# Patient Record
Sex: Male | Born: 1965
Health system: Southern US, Community
[De-identification: ages and names within clinical notes are randomized; demographics above are authoritative.]

## PROBLEM LIST (undated history)

## (undated) DIAGNOSIS — I10 Essential (primary) hypertension: Secondary | ICD-10-CM

## (undated) DIAGNOSIS — Z951 Presence of aortocoronary bypass graft: Secondary | ICD-10-CM

## (undated) DIAGNOSIS — D649 Anemia, unspecified: Secondary | ICD-10-CM

## (undated) DIAGNOSIS — G4730 Sleep apnea, unspecified: Secondary | ICD-10-CM

## (undated) DIAGNOSIS — K859 Acute pancreatitis without necrosis or infection, unspecified: Secondary | ICD-10-CM

## (undated) DIAGNOSIS — I2510 Atherosclerotic heart disease of native coronary artery without angina pectoris: Secondary | ICD-10-CM

## (undated) DIAGNOSIS — I509 Heart failure, unspecified: Secondary | ICD-10-CM

## (undated) DIAGNOSIS — J3489 Other specified disorders of nose and nasal sinuses: Secondary | ICD-10-CM

## (undated) DIAGNOSIS — N281 Cyst of kidney, acquired: Secondary | ICD-10-CM

## (undated) DIAGNOSIS — R079 Chest pain, unspecified: Secondary | ICD-10-CM

## (undated) DIAGNOSIS — K760 Fatty (change of) liver, not elsewhere classified: Secondary | ICD-10-CM

## (undated) DIAGNOSIS — C8590 Non-Hodgkin lymphoma, unspecified, unspecified site: Secondary | ICD-10-CM

## (undated) DIAGNOSIS — M2340 Loose body in knee, unspecified knee: Secondary | ICD-10-CM

## (undated) DIAGNOSIS — J189 Pneumonia, unspecified organism: Secondary | ICD-10-CM

## (undated) DIAGNOSIS — Z5189 Encounter for other specified aftercare: Secondary | ICD-10-CM

## (undated) DIAGNOSIS — S83289A Other tear of lateral meniscus, current injury, unspecified knee, initial encounter: Secondary | ICD-10-CM

## (undated) DIAGNOSIS — E785 Hyperlipidemia, unspecified: Secondary | ICD-10-CM

## (undated) DIAGNOSIS — R9439 Abnormal result of other cardiovascular function study: Secondary | ICD-10-CM

## (undated) DIAGNOSIS — D732 Chronic congestive splenomegaly: Secondary | ICD-10-CM

## (undated) HISTORY — PX: NASAL SEPTUM SURGERY: SHX37

## (undated) HISTORY — PX: OTHER SURGICAL HISTORY: SHX169

## (undated) HISTORY — PX: HERNIA REPAIR: SHX51

## (undated) HISTORY — DX: Atherosclerotic heart disease of native coronary artery without angina pectoris: I25.10

## (undated) HISTORY — PX: TONSILLECTOMY: SUR1361

## (undated) HISTORY — PX: ELBOW SURGERY: SHX618

## (undated) HISTORY — PX: CARDIAC CATHETERIZATION: SHX172

## (undated) HISTORY — PX: ABDOMINAL AORTIC ANEURYSM REPAIR: SUR1152

## (undated) HISTORY — DX: Encounter for other specified aftercare: Z51.89

## (undated) HISTORY — DX: Chest pain, unspecified: R07.9

---

## 1989-03-19 DIAGNOSIS — C8590 Non-Hodgkin lymphoma, unspecified, unspecified site: Secondary | ICD-10-CM

## 1989-03-19 HISTORY — DX: Non-Hodgkin lymphoma, unspecified, unspecified site: C85.90

## 2007-03-02 ENCOUNTER — Inpatient Hospital Stay (HOSPITAL_COMMUNITY): Admission: EM | Admit: 2007-03-02 | Discharge: 2007-03-04 | Payer: Self-pay | Admitting: Emergency Medicine

## 2007-09-07 ENCOUNTER — Ambulatory Visit: Payer: Self-pay | Admitting: Vascular Surgery

## 2007-09-07 ENCOUNTER — Ambulatory Visit: Payer: Self-pay | Admitting: Oncology

## 2007-09-07 ENCOUNTER — Inpatient Hospital Stay (HOSPITAL_COMMUNITY): Admission: EM | Admit: 2007-09-07 | Discharge: 2007-09-15 | Payer: Self-pay | Admitting: Emergency Medicine

## 2007-09-07 ENCOUNTER — Encounter (INDEPENDENT_AMBULATORY_CARE_PROVIDER_SITE_OTHER): Payer: Self-pay | Admitting: Emergency Medicine

## 2007-09-07 ENCOUNTER — Ambulatory Visit: Payer: Self-pay | Admitting: Pulmonary Disease

## 2007-09-08 ENCOUNTER — Encounter (INDEPENDENT_AMBULATORY_CARE_PROVIDER_SITE_OTHER): Payer: Self-pay | Admitting: Pulmonary Disease

## 2007-09-24 ENCOUNTER — Encounter: Admission: RE | Admit: 2007-09-24 | Discharge: 2007-09-24 | Payer: Self-pay | Admitting: Family Medicine

## 2009-08-02 IMAGING — CR DG CHEST 1V PORT
1 series · 1 of 1 positions shown · non-contrast
Comparison: None

CLINICAL DATA: Fever.  Congestive heart failure.

PORTABLE CHEST - 1 VIEW

[view not recorded]
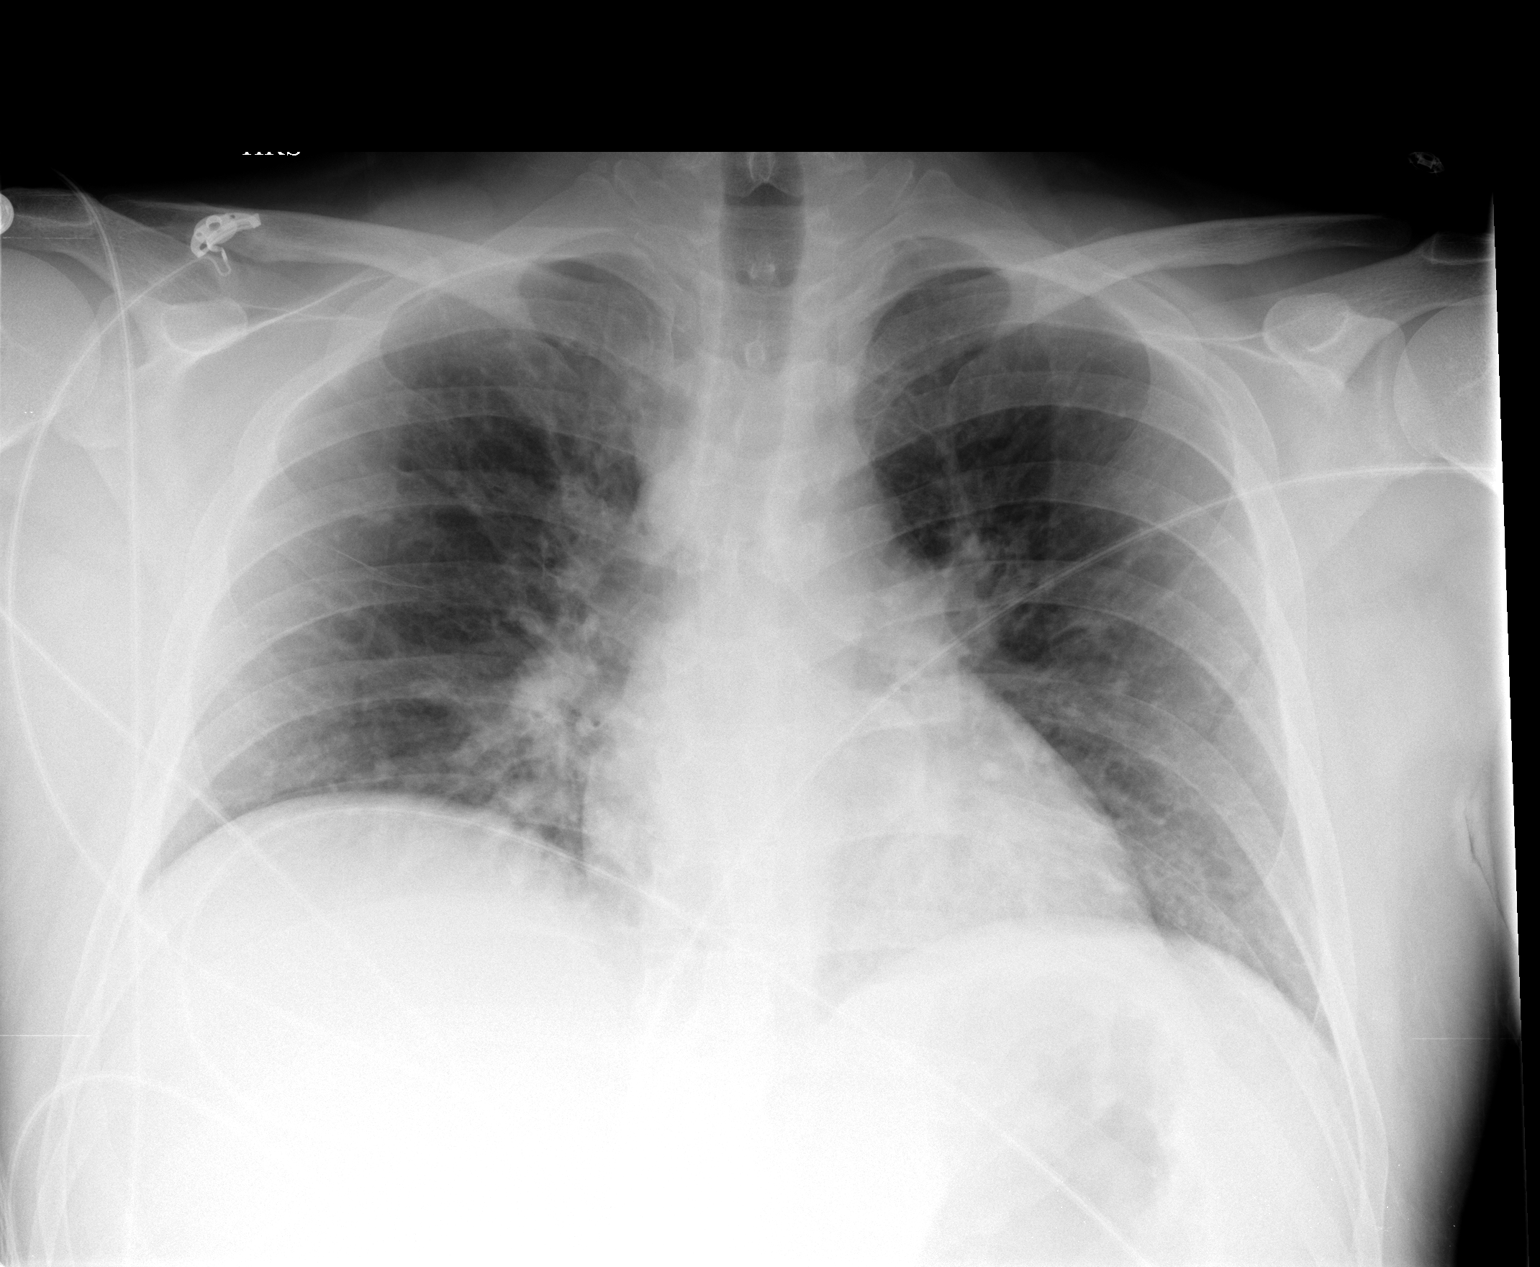

[1 of 1 positions shown; findings below may reference images not displayed]

FINDINGS: The heart size is normal.  The azygos vein is slightly
distended but the pulmonary vascularity is normal. There is some
peribronchial thickening at the right lung base but there are no
discrete infiltrates or effusions.  No significant bony
abnormality.

The patient has taken a shallow inspiration which could accentuate
the azygos vein and markings.
IMPRESSION: Probable bronchitic changes at the right base.

## 2009-09-17 ENCOUNTER — Inpatient Hospital Stay (HOSPITAL_COMMUNITY): Admission: EM | Admit: 2009-09-17 | Discharge: 2009-09-22 | Payer: Self-pay | Admitting: Emergency Medicine

## 2009-09-20 ENCOUNTER — Ambulatory Visit: Payer: Self-pay | Admitting: Oncology

## 2009-09-27 ENCOUNTER — Ambulatory Visit: Payer: Self-pay | Admitting: Oncology

## 2009-09-27 ENCOUNTER — Observation Stay (HOSPITAL_COMMUNITY): Admission: EM | Admit: 2009-09-27 | Discharge: 2009-09-29 | Payer: Self-pay | Admitting: Emergency Medicine

## 2009-10-17 LAB — CBC & DIFF AND RETIC
HGB: 11 g/dL — ABNORMAL LOW (ref 13.0–17.1)
MCH: 24.1 pg — ABNORMAL LOW (ref 27.2–33.4)
MCHC: 32.1 g/dL (ref 32.0–36.0)
MCV: 75.2 fL — ABNORMAL LOW (ref 79.3–98.0)
NEUT#: 4.8 10*3/uL (ref 1.5–6.5)
Platelets: 110 10*3/uL — ABNORMAL LOW (ref 140–400)
Retic %: 2.15 % — ABNORMAL HIGH (ref 0.50–1.60)
WBC: 7.8 10*3/uL (ref 4.0–10.3)
lymph#: 2.1 10*3/uL (ref 0.9–3.3)

## 2009-10-17 LAB — COMPREHENSIVE METABOLIC PANEL
ALT: 35 U/L (ref 0–53)
AST: 24 U/L (ref 0–37)
Albumin: 4.2 g/dL (ref 3.5–5.2)
BUN: 14 mg/dL (ref 6–23)
Glucose, Bld: 104 mg/dL — ABNORMAL HIGH (ref 70–99)
Total Bilirubin: 0.4 mg/dL (ref 0.3–1.2)

## 2009-10-17 LAB — MORPHOLOGY: PLT EST: DECREASED

## 2009-10-17 LAB — AMYLASE: Amylase: 46 U/L (ref 0–105)

## 2009-10-17 LAB — LIPASE: Lipase: 52 U/L (ref 0–75)

## 2010-02-14 ENCOUNTER — Ambulatory Visit: Payer: Self-pay | Admitting: Oncology

## 2010-02-24 LAB — CBC & DIFF AND RETIC
BASO%: 1.6 % (ref 0.0–2.0)
HGB: 12.5 g/dL — ABNORMAL LOW (ref 13.0–17.1)
MCV: 74.2 fL — ABNORMAL LOW (ref 79.3–98.0)
MONO#: 0.5 10*3/uL (ref 0.1–0.9)
NEUT#: 2.9 10*3/uL (ref 1.5–6.5)
Retic Ct Abs: 86.38 10*3/uL — ABNORMAL HIGH (ref 24.10–77.50)

## 2010-02-24 LAB — COMPREHENSIVE METABOLIC PANEL
ALT: 36 U/L (ref 0–53)
CO2: 21 mEq/L (ref 19–32)
Chloride: 96 mEq/L (ref 96–112)
Creatinine, Ser: 1 mg/dL (ref 0.40–1.50)
Glucose, Bld: 359 mg/dL — ABNORMAL HIGH (ref 70–99)
Potassium: 4.2 mEq/L (ref 3.5–5.3)
Total Bilirubin: 0.4 mg/dL (ref 0.3–1.2)
Total Protein: 7.4 g/dL (ref 6.0–8.3)

## 2010-02-24 LAB — LIPASE: Lipase: 67 U/L (ref 0–75)

## 2010-02-24 LAB — MORPHOLOGY: PLT EST: DECREASED

## 2010-02-24 LAB — CHCC SMEAR

## 2010-04-10 ENCOUNTER — Encounter: Payer: Self-pay | Admitting: Family Medicine

## 2010-06-04 LAB — CROSSMATCH: ABO/RH(D): AB NEG

## 2010-06-04 LAB — BASIC METABOLIC PANEL
BUN: 10 mg/dL (ref 6–23)
CO2: 21 mEq/L (ref 19–32)
Chloride: 107 mEq/L (ref 96–112)
Chloride: 110 mEq/L (ref 96–112)
Creatinine, Ser: 0.77 mg/dL (ref 0.4–1.5)
GFR calc Af Amer: >60 mL/min (ref >60)
GFR calc non Af Amer: >60 mL/min (ref >60)
Potassium: 5 mEq/L (ref 3.5–5.1)

## 2010-06-04 LAB — CBC
HCT: 26.3 % — ABNORMAL LOW (ref 39.0–52.0)
HCT: 27.7 % — ABNORMAL LOW (ref 39.0–52.0)
HCT: 29.6 % — ABNORMAL LOW (ref 39.0–52.0)
HCT: 38.4 % — ABNORMAL LOW (ref 39.0–52.0)
HCT: 41.9 % (ref 39.0–52.0)
Hemoglobin: 10.1 g/dL — ABNORMAL LOW (ref 13.0–17.0)
Hemoglobin: 12.5 g/dL — ABNORMAL LOW (ref 13.0–17.0)
Hemoglobin: 13.6 g/dL (ref 13.0–17.0)
Hemoglobin: 8.6 g/dL — ABNORMAL LOW (ref 13.0–17.0)
Hemoglobin: 9.3 g/dL — ABNORMAL LOW (ref 13.0–17.0)
Hemoglobin: 9.7 g/dL — ABNORMAL LOW (ref 13.0–17.0)
MCH: 25.3 pg — ABNORMAL LOW (ref 26.0–34.0)
MCH: 25.4 pg — ABNORMAL LOW (ref 26.0–34.0)
MCH: 25.6 pg — ABNORMAL LOW (ref 26.0–34.0)
MCH: 25.6 pg — ABNORMAL LOW (ref 26.0–34.0)
MCHC: 33.7 g/dL (ref 30.0–36.0)
MCHC: 34 g/dL (ref 30.0–36.0)
MCHC: 34.1 g/dL (ref 30.0–36.0)
MCHC: 34.4 g/dL (ref 30.0–36.0)
MCV: 75.3 fL — ABNORMAL LOW (ref 78.0–100.0)
MCV: 75.3 fL — ABNORMAL LOW (ref 78.0–100.0)
MCV: 75.5 fL — ABNORMAL LOW (ref 78.0–100.0)
MCV: 75.6 fL — ABNORMAL LOW (ref 78.0–100.0)
MCV: 76 fL — ABNORMAL LOW (ref 78.0–100.0)
MCV: 76.1 fL — ABNORMAL LOW (ref 78.0–100.0)
Platelets: 135 10*3/uL — ABNORMAL LOW (ref 150–400)
Platelets: 204 10*3/uL (ref 150–400)
Platelets: 217 10*3/uL (ref 150–400)
Platelets: 77 10*3/uL — ABNORMAL LOW (ref 150–400)
Platelets: 81 10*3/uL — ABNORMAL LOW (ref 150–400)
Platelets: 89 10*3/uL — ABNORMAL LOW (ref 150–400)
Platelets: 98 10*3/uL — ABNORMAL LOW (ref 150–400)
RBC: 3.36 MIL/uL — ABNORMAL LOW (ref 4.22–5.81)
RBC: 3.48 MIL/uL — ABNORMAL LOW (ref 4.22–5.81)
RBC: 3.52 MIL/uL — ABNORMAL LOW (ref 4.22–5.81)
RBC: 3.85 MIL/uL — ABNORMAL LOW (ref 4.22–5.81)
RBC: 4.89 MIL/uL (ref 4.22–5.81)
RBC: 5.04 MIL/uL (ref 4.22–5.81)
RBC: 5.47 MIL/uL (ref 4.22–5.81)
RDW: 17.3 % — ABNORMAL HIGH (ref 11.5–15.5)
RDW: 17.3 % — ABNORMAL HIGH (ref 11.5–15.5)
RDW: 17.4 % — ABNORMAL HIGH (ref 11.5–15.5)
RDW: 17.4 % — ABNORMAL HIGH (ref 11.5–15.5)
WBC: 10.6 10*3/uL — ABNORMAL HIGH (ref 4.0–10.5)
WBC: 13.2 10*3/uL — ABNORMAL HIGH (ref 4.0–10.5)
WBC: 3.6 10*3/uL — ABNORMAL LOW (ref 4.0–10.5)
WBC: 4.7 10*3/uL (ref 4.0–10.5)
WBC: 4.8 10*3/uL (ref 4.0–10.5)
WBC: 5.8 10*3/uL (ref 4.0–10.5)
WBC: 7.7 10*3/uL (ref 4.0–10.5)
WBC: 8.4 10*3/uL (ref 4.0–10.5)

## 2010-06-04 LAB — COMPREHENSIVE METABOLIC PANEL
ALT: 17 U/L (ref 0–53)
ALT: 26 U/L (ref 0–53)
ALT: 32 U/L (ref 0–53)
ALT: 49 U/L (ref 0–53)
ALT: 51 U/L (ref 0–53)
AST: 14 U/L (ref 0–37)
AST: 15 U/L (ref 0–37)
AST: 32 U/L (ref 0–37)
AST: 87 U/L — ABNORMAL HIGH (ref 0–37)
Albumin: 2.5 g/dL — ABNORMAL LOW (ref 3.5–5.2)
Albumin: 2.6 g/dL — ABNORMAL LOW (ref 3.5–5.2)
Albumin: 2.8 g/dL — ABNORMAL LOW (ref 3.5–5.2)
Albumin: 2.8 g/dL — ABNORMAL LOW (ref 3.5–5.2)
Albumin: 3.9 g/dL (ref 3.5–5.2)
Alkaline Phosphatase: 40 U/L (ref 39–117)
Alkaline Phosphatase: 65 U/L (ref 39–117)
Alkaline Phosphatase: 78 U/L (ref 39–117)
Alkaline Phosphatase: 84 U/L (ref 39–117)
Alkaline Phosphatase: 87 U/L (ref 39–117)
BUN: 13 mg/dL (ref 6–23)
BUN: 17 mg/dL (ref 6–23)
BUN: 8 mg/dL (ref 6–23)
CO2: 25 mEq/L (ref 19–32)
CO2: 25 mEq/L (ref 19–32)
CO2: 26 mEq/L (ref 19–32)
Calcium: 10.3 mg/dL (ref 8.4–10.5)
Chloride: 105 mEq/L (ref 96–112)
Chloride: 109 mEq/L (ref 96–112)
Chloride: 110 mEq/L (ref 96–112)
Chloride: 111 mEq/L (ref 96–112)
Chloride: 116 mEq/L — ABNORMAL HIGH (ref 96–112)
Creatinine, Ser: 0.71 mg/dL (ref 0.4–1.5)
Creatinine, Ser: 0.85 mg/dL (ref 0.4–1.5)
GFR calc Af Amer: >60 mL/min (ref >60)
GFR calc Af Amer: >60 mL/min (ref >60)
GFR calc Af Amer: >60 mL/min (ref >60)
GFR calc non Af Amer: >60 mL/min (ref >60)
GFR calc non Af Amer: >60 mL/min (ref >60)
GFR calc non Af Amer: >60 mL/min (ref >60)
Glucose, Bld: 126 mg/dL — ABNORMAL HIGH (ref 70–99)
Glucose, Bld: 171 mg/dL — ABNORMAL HIGH (ref 70–99)
Potassium: 3.6 mEq/L (ref 3.5–5.1)
Potassium: 3.8 mEq/L (ref 3.5–5.1)
Potassium: 3.9 mEq/L (ref 3.5–5.1)
Potassium: 3.9 mEq/L (ref 3.5–5.1)
Potassium: 4.5 mEq/L (ref 3.5–5.1)
Sodium: 135 mEq/L (ref 135–145)
Sodium: 137 mEq/L (ref 135–145)
Sodium: 139 mEq/L (ref 135–145)
Sodium: 141 mEq/L (ref 135–145)
Total Bilirubin: 0.4 mg/dL (ref 0.3–1.2)
Total Bilirubin: 0.4 mg/dL (ref 0.3–1.2)
Total Bilirubin: 0.4 mg/dL (ref 0.3–1.2)
Total Bilirubin: 0.5 mg/dL (ref 0.3–1.2)
Total Bilirubin: 0.7 mg/dL (ref 0.3–1.2)
Total Bilirubin: 1 mg/dL (ref 0.3–1.2)
Total Bilirubin: 2.7 mg/dL — ABNORMAL HIGH (ref 0.3–1.2)
Total Protein: 6.3 g/dL (ref 6.0–8.3)
Total Protein: 7.9 g/dL (ref 6.0–8.3)

## 2010-06-04 LAB — URINALYSIS, ROUTINE W REFLEX MICROSCOPIC
Bilirubin Urine: NEGATIVE
Glucose, UA: >1000 mg/dL — AB
Hgb urine dipstick: NEGATIVE
Leukocytes, UA: NEGATIVE
Nitrite: NEGATIVE
Protein, ur: 100 mg/dL — AB
Specific Gravity, Urine: 1.04 — ABNORMAL HIGH (ref 1.005–1.030)
Urobilinogen, UA: 0.2 mg/dL (ref 0.0–1.0)
pH: 5.5 (ref 5.0–8.0)

## 2010-06-04 LAB — GLUCOSE, CAPILLARY
Glucose-Capillary: 117 mg/dL — ABNORMAL HIGH (ref 70–99)
Glucose-Capillary: 121 mg/dL — ABNORMAL HIGH (ref 70–99)
Glucose-Capillary: 126 mg/dL — ABNORMAL HIGH (ref 70–99)
Glucose-Capillary: 164 mg/dL — ABNORMAL HIGH (ref 70–99)
Glucose-Capillary: 182 mg/dL — ABNORMAL HIGH (ref 70–99)
Glucose-Capillary: 183 mg/dL — ABNORMAL HIGH (ref 70–99)
Glucose-Capillary: 184 mg/dL — ABNORMAL HIGH (ref 70–99)
Glucose-Capillary: 184 mg/dL — ABNORMAL HIGH (ref 70–99)
Glucose-Capillary: 188 mg/dL — ABNORMAL HIGH (ref 70–99)
Glucose-Capillary: 191 mg/dL — ABNORMAL HIGH (ref 70–99)
Glucose-Capillary: 191 mg/dL — ABNORMAL HIGH (ref 70–99)
Glucose-Capillary: 224 mg/dL — ABNORMAL HIGH (ref 70–99)
Glucose-Capillary: 361 mg/dL — ABNORMAL HIGH (ref 70–99)

## 2010-06-04 LAB — LIPID PANEL
Cholesterol: 120 mg/dL (ref 0–200)
LDL Cholesterol: 43 mg/dL (ref 0–99)
LDL Cholesterol: UNDETERMINED mg/dL (ref 0–99)
LDL Cholesterol: UNDETERMINED mg/dL (ref 0–99)
Triglycerides: 295 mg/dL — ABNORMAL HIGH (ref <150)
Triglycerides: 3962 mg/dL — ABNORMAL HIGH (ref <150)
VLDL: UNDETERMINED mg/dL (ref 0–40)

## 2010-06-04 LAB — DIFFERENTIAL
Basophils Absolute: 0 10*3/uL (ref 0.0–0.1)
Basophils Absolute: 0.1 10*3/uL (ref 0.0–0.1)
Basophils Absolute: 0.2 10*3/uL — ABNORMAL HIGH (ref 0.0–0.1)
Basophils Relative: 0 % (ref 0–1)
Eosinophils Absolute: 0 10*3/uL (ref 0.0–0.7)
Eosinophils Absolute: 0.1 10*3/uL (ref 0.0–0.7)
Eosinophils Relative: 1 % (ref 0–5)
Eosinophils Relative: 2 % (ref 0–5)
Lymphocytes Relative: 14 % (ref 12–46)
Lymphocytes Relative: 15 % (ref 12–46)
Lymphs Abs: 1.3 10*3/uL (ref 0.7–4.0)
Monocytes Relative: 4 % (ref 3–12)
Neutro Abs: 10 10*3/uL — ABNORMAL HIGH (ref 1.7–7.7)
Neutro Abs: 12 10*3/uL — ABNORMAL HIGH (ref 1.7–7.7)
Neutrophils Relative %: 74 % (ref 43–77)
Neutrophils Relative %: 78 % — ABNORMAL HIGH (ref 43–77)

## 2010-06-04 LAB — POTASSIUM: Potassium: 5.5 mEq/L — ABNORMAL HIGH (ref 3.5–5.1)

## 2010-06-04 LAB — LIPASE, BLOOD
Lipase: 105 U/L — ABNORMAL HIGH (ref 11–59)
Lipase: 167 U/L — ABNORMAL HIGH (ref 11–59)
Lipase: 94 U/L — ABNORMAL HIGH (ref 11–59)

## 2010-06-04 LAB — CULTURE, BLOOD (ROUTINE X 2): Culture: NO GROWTH

## 2010-06-04 LAB — URINE CULTURE: Colony Count: NO GROWTH

## 2010-06-04 LAB — URINE MICROSCOPIC-ADD ON

## 2010-06-04 LAB — HEPATIC FUNCTION PANEL
ALT: 23 U/L (ref 0–53)
AST: 41 U/L — ABNORMAL HIGH (ref 0–37)
Alkaline Phosphatase: 53 U/L (ref 39–117)
Bilirubin, Direct: 0.5 mg/dL — ABNORMAL HIGH (ref 0.0–0.3)
Total Bilirubin: 1.1 mg/dL (ref 0.3–1.2)

## 2010-06-04 LAB — VITAMIN D 1,25 DIHYDROXY
Vitamin D 1, 25 (OH)2 Total: 23 pg/mL (ref 18–72)
Vitamin D3 1, 25 (OH)2: 23 pg/mL

## 2010-06-04 LAB — HEPARIN INDUCED THROMBOCYTOPENIA PNL
Heparin Induced Plt Ab: NEGATIVE
UFH SRA Result: NEGATIVE

## 2010-06-04 LAB — PTH, INTACT AND CALCIUM
Calcium, Total (PTH): 9.5 mg/dL (ref 8.4–10.5)
PTH: 27.2 pg/mL (ref 14.0–72.0)

## 2010-06-04 LAB — PROTIME-INR: INR: 1.2 (ref 0.00–1.49)

## 2010-06-04 LAB — DIC (DISSEMINATED INTRAVASCULAR COAGULATION)PANEL
Fibrinogen: 735 mg/dL — ABNORMAL HIGH (ref 204–475)
Platelets: 80 10*3/uL — ABNORMAL LOW (ref 150–400)
Smear Review: NONE SEEN
aPTT: 32 seconds (ref 24–37)

## 2010-06-04 LAB — CARDIAC PANEL(CRET KIN+CKTOT+MB+TROPI)
CK, MB: 1.5 ng/mL (ref 0.3–4.0)
Total CK: 44 U/L (ref 7–232)

## 2010-06-04 LAB — HEMOGLOBIN A1C: Mean Plasma Glucose: 255 mg/dL — ABNORMAL HIGH (ref <117)

## 2010-06-04 LAB — GASTRIC OCCULT BLOOD (1-CARD TO LAB)
Occult Blood, Gastric: POSITIVE — AB
pH, Gastric: 1

## 2010-06-04 LAB — TECHNOLOGIST SMEAR REVIEW

## 2010-06-04 LAB — IRON AND TIBC
Iron: 42 ug/dL (ref 42–135)
UIBC: 175 ug/dL

## 2010-06-04 LAB — HEMOGLOBIN AND HEMATOCRIT, BLOOD
HCT: 26 % — ABNORMAL LOW (ref 39.0–52.0)
Hemoglobin: 8.8 g/dL — ABNORMAL LOW (ref 13.0–17.0)

## 2010-06-04 LAB — FERRITIN: Ferritin: 246 ng/mL (ref 22–322)

## 2010-08-01 NOTE — H&P (Signed)
Juan [name]                 ACCOUNT NO.:  [id number]   MEDICAL RECORD NO.:  [id number]          PATIENT TYPE:  INP   LOCATION:  5710                         FACILITY:  [facility]   PHYSICIAN:  Juan [name], M.D.DATE OF BIRTH:  01-23-1966   DATE OF ADMISSION:  03/02/2007  DATE OF DISCHARGE:                              HISTORY & PHYSICAL   PRIMARY CARE PHYSICIAN:  Unassigned.   CHIEF COMPLAINT:  Cat bite.   HISTORY OF PRESENTING COMPLAINT:  Juan [name] is a 45 year old Caucasian  male, with history of diabetes mellitus and hyperlipidemia.  He recently  relocated from [facility] to [place] yesterday.  He sustained a cat bite  yesterday night.  He reported that he traveled with a cat from [place]  to [place], and he had to keep him restricted.  Also while unpacking  at home, he kept the cat restricted -- hence the cat was somewhat  aggravated.  The patient states that the cat is up to date with his  immunizations and veterinary check ups.  The cat bite was through his  right hand, over the thenar prominence and posterior aspect of distal  right forearm.  He also had multiple surrounding scratch marks.   This area became red and swollen when he woke up this morning.  He  decided to come to the emergency room.  The patient has received 3 grams  of Unasyn.  He denies fever or chills.  His temperature was noted to be  100.8 during the course in the emergency room.  His white cell count is  normal.  He states that the forearm swelling has diminished appreciably  since he has been in the emergency room, after the IV antibiotics and  narcotic analgesia.   The patient will be admitted for further treatment.   PAST MEDICAL HISTORY:  1. Diabetes mellitus.  Last hemoglobin A1c was above 8.  This was done      about 6 months ago.  2. Hyperlipidemia, with predominate hypertriglyceridemia.  The patient      states that his triglycerides range between 2000 to 10,000.  He      sees an  endocrinologist.  3. History of lymphoma in the 1990's.  Status post chemotherapy and      radiation therapy.   CURRENT MEDICATIONS:  1. Zetia 10 mg daily.  2. Crestor 10 mg daily.  3. Lantus insulin 40 units subcutaneous q.h.s.  4. Metformin 1000 mg b.i.d.  5. Tricor 145 mg p.o. daily.   ALLERGIES:  NO KNOWN DRUG ALLERGIES.   SOCIAL HISTORY:  The patient does not smoke cigarettes nor drink  alcohol.  He does not use illicit drugs.  He works with [employer].  He  relocated from [place] to [place] yesterday.   FAMILY HISTORY:  Unknown.  The patient is adopted.   CURRENT VITALS:  Temperature 100.8, blood pressure 127/79, pulse 96,  respiratory rate 20, O2 saturations 99% on room air.   PHYSICAL EXAMINATION:  The patient is awake, alert and oriented in time,  place and person.  Normocephalic and atraumatic.  Pupils are equal,  round and reactive to light.  Extraocular movements intact.  No elevated  JVD.  No carotid bruits.  Mucous membranes moist.  No oral thrush.  LUNGS:  Clear bilaterally to auscultation.  CVS:  S1 and S2 regular.  No murmur, no gallop, no rub.  ABDOMEN:  Not distended; soft and nontender.  Bowel sounds are present.  MUSCULOSKELETAL:  Right upper extremity-- The patient has 2 bite marks  over the distal third of right forearm on the dorsal surface; with  surrounding redness, swelling and tenderness.  There was also a bite  mark over the thenar eminence proximally, with little inflammation.  He  has multiple scratch marks on the dorsum of the right and right forearm.  Left Forearm -- There are fewer multiple scratch marks on the dorsal  aspect of the left forearm.  The patient has no axillary  lymphadenopathy.  He has good range of movement of his fingers.  He can  oppose his right thumb without much difficulty.  He has no paresthesias.  CNS:  No focal neurological deficits.   INITIAL LABORATORY DATA:  White cell count 8.6, hemoglobin 13.3,  hematocrit 39.0,  platelets 159.  Differential:  Neutrophils 77%,  lymphocytes 16%.  Absolute neutrophil count within normal range.  No  other laboratory data available.   ASSESSMENT AND PLAN:  Mr. Juan [name] is a 45 year old Caucasian male with  diabetes and significant hyperlipidemia.  Presenting with cat bite from  yesterday night; which has developed surrounding cellulitis.  He has low-  grade fever.  The white cell count is normal.  He will be admitted for  further treatment with IV antibiotics.   ADMISSION DIAGNOSES:  1. CAT BITE, RIGHT HAND/FOREARM; WITH SURROUNDING CELLULITIS.  Will      elevate the right upper extremity.  Continue Unasyn 3 grams IV q.6      h.  Vicodin one to two p.o. q.4-6 h p.r.n. for mild to moderate      pain.  Dilaudid 0.5-1.0 mg IV q.4-6 h p.r.n. for severe pain.  The      patient has received tetanus toxoid in the emergency room.  It      appears that his swelling is improving at this point. Will consult      a hand surgeon.  2. DIABETES MELLITUS.  Will resume metformin and Lantus.  Will add      sliding scale insulin with NovoLog q.a.c.  3. HYPERLIPIDEMIA.  Resume Crestor, Zetia and Tricor.  The patient has      scheduled an appointment to follow up with an endocrinologist here      in [place].      Juan [name], M.D.  Electronically Signed     MBB/MEDQ  D:  03/02/2007  T:  03/02/2007  Job:  [id number]

## 2010-08-01 NOTE — H&P (Signed)
Juan [name], Juan [name]                 ACCOUNT NO.:  [id number]   MEDICAL RECORD NO.:  [id number]          PATIENT TYPE:  INP   LOCATION:  2629                         FACILITY:  [facility]   PHYSICIAN:  Corinna L. Lendell Caprice, MDDATE OF BIRTH:  Dec 01, 1965   DATE OF ADMISSION:  09/07/2007  DATE OF DISCHARGE:                              HISTORY & PHYSICAL   CHIEF COMPLAINT:  Leg pain and fever.   HISTORY OF PRESENT ILLNESS:  Juan [name] is a 45 year old white male  patient of [facility].  He presented to [facility] for leg symptoms.  He reports that his leg began hurting several  days ago.  He also started having fevers to 102 at home as well as  chills.  His appetite has been poor.  He has had no trauma to the leg.  At the Urgent Care Center and had a blood pressure of 74 after getting  liter of fluid and Rocephin.  He was therefore sent to the emergency  room via EMS.  Currently, he says the pain is slightly better.  His  nausea is better.   PAST MEDICAL HISTORY:  1. Severe hypertriglyceridemia.  2. History of non-Hodgkin lymphoma.  3. Type 2 diabetes.   MEDICATIONS:  1. Metformin 1000 mg twice a day.  2. Crestor 20 mg a day.  3. Zetia 10 mg a day.  4. Tricor 145 mg a day.  5. Lantus insulin 25 units in the morning and 50 units in the evening.  6. NovoLog 50 units before each meal and then sliding scale NovoLog 2      hours after meals.   He reports vomiting after Gadolinium, but sounds as if it is not a true  allergy.   REVIEW OF SYSTEMS:  As above, otherwise negative.   PHYSICAL EXAMINATION:  VITAL SIGNS:  His temperature is 101.4; blood  pressure 105/59, initially 74 at urgent care; pulse is 120 to 130;  respiratory rate 18; and oxygen saturation 98% on room air.  GENERAL:  The patient is an ill-appearing male.  HEENT:  Normocephalic and atraumatic.  Pupils equal, round, and reactive  to light.  Dry mucous membranes.  NECK:  Supple.  No  lymphadenopathy.  LUNGS:  Clear to auscultation bilaterally without wheezes, rhonchi, or  rales.  CARDIOVASCULAR:  Fast, regular.  No murmurs, gallops, or rubs.  ABDOMEN:  Soft, nontender, and nondistended.  GU and rectal:  Deferred.  EXTREMITIES:  He has erythema, induration, warmth, and tenderness  involving the left lower extremity from the knee to past the ankle,  essentially circumferentially, there is no drainage or fluctuance.  NEUROLOGIC:  The patient is sleepy, but oriented.  Cranial nerves and  sensory motor exam are intact.  SKIN:  As above, also he has multiple xanthomata over his arms and legs.   LABORATORY DATA:  CBC significant for hemoglobin of 10.8, hematocrit 31,  and platelet count is 53.  White count is 4.4.  Complete metabolic panel  significant for glucose of 205, BUN of 31, INR is 1.3, and PTT is 33.  Urinalysis shows greater than 1000 glucose and specific gravity 1.017.  Negative ketones, negative nitrite, and negative leukocyte esterase.   ASSESSMENT AND PLAN:  1. Cellulitis of the left leg.  The patient will be admitted to      telemetry with frequent vital sign checks.  He will get vancomycin      and supportive care.  2. Hypotension, resolved, secondary to dehydration, but certainly      sepsis syndrome is possible.  His blood pressure, however,      currently is 120 manually.  3. Hypertriglyceridemia.  Continue outpatient medications.  4. Type 2 diabetes.  Continue metformin.  I will give 20 units of      Lantus twice a day and decrease his meal coverage for now as his      intake may be variable.  We will monitor blood glucose.  5. Thrombocytopenia.  I have no old labs for comparison.  6. History of non-Hodgkin's lymphoma.  7. He will also get deep venous thrombosis prophylaxis.      Corinna L. Lendell Caprice, MD  Electronically Signed     CLS/MEDQ  D:  09/07/2007  T:  09/08/2007  Job:  [id number]   cc:   Lavonda Jumbo, M.D.  [name], M.D.

## 2010-08-01 NOTE — Discharge Summary (Signed)
NAMEDISHON, KEHOE                 ACCOUNT NO.:  [id number]   MEDICAL RECORD NO.:  [id number]          PATIENT TYPE:  INP   LOCATION:  5528                         FACILITY:  [facility]   PHYSICIAN:  Corinna L. Lendell Caprice, MDDATE OF BIRTH:  04/01/1965   DATE OF ADMISSION:  09/07/2007  DATE OF DISCHARGE:  09/15/2007                               DISCHARGE SUMMARY   DISCHARGE DIAGNOSES:  1. Left leg cellulitis.  2. Septic shock.  3. Hypertriglyceridemia.  4. Type 2 diabetes.  5. Pancytopenia, improved.  6. History of non-Hodgkin lymphoma.  7. Hypophosphatemia.  8. Metabolic acidosis.  9. Acute respiratory distress with high hypoxemia and tachypnea.   CONSULTATIONS:  Critical Care Medicine and Hematology-Oncology.   DISCHARGE MEDICATIONS:  1. Doxycycline 100 mg p.o. b.i.d. until gone.  2. Augmentin 875 mg p.o. b.i.d. until gone.  3. Vicodin 1-2 every 4 hours as needed for pain.  4. Lantus 55 units in the morning and 25 units in the evening.  5. Metformin 1000 mg p.o. b.i.d.  6. Crestor 20 mg p.o. daily.  7. Zetia 10 mg a day.  8. Tricor 145 mg a day.  9. NovoLog as previous with meals.   CONDITION:  Stable.  Keep leg elevated.   ACTIVITY:  He may return to work on September 22, 2007.  Increase activity  slowly.  No driving for 1 week.   FOLLOWUP:  Follow up with primary care/nurse practitioner Barry Dienes next  week.   DIET:  Should be diabetic low fat.   PROCEDURES:  None.   LABORATORY DATA:  Blood cultures negative.  Admission white count 4.4,  hemoglobin 10.8, hematocrit 31, MCV 75, platelet count 53, 87%  neutrophils, 8% lymphocytes and his white blood cell count dropped to a  low of 2.9 with 79% neutrophils and at discharge his white count was 7.7  with his platelet count dropped to a low of 41 and at discharge was at  184, and hemoglobin at discharge was 10.2.  Pathology review of the  peripheral smear showed increased bands, Dohle bodies, no schistocytes  were seen.  INR on  admission was 1.3, PTT was 33, D-dimer 1.65,  fibrinogen 752.  Complete metabolic panel on admission significant for a  glucose of 205, alkaline phosphatase was normal, albumin was 3.1, total  protein 5.8, hemoglobin A1c was 9.8, lactic acid level was 2.4, lipase  17, amylase 49.  BNP 172.  Cardiac markers significant for a peak  troponin of 0.09, triglycerides 485, iron less than 10, ferritin 450.  B12 and folate were normal.  A.m. cortisol was 28.  Serum protein  electrophoresis showed no M spike.  Urinalysis showed greater than 1000  ketones, specific gravity 1.017, negative nitrite, negative blood,  negative leukocyte esterase.   SPECIAL STUDIES/RADIOLOGY:  Echocardiogram showed ejection fraction of  55%-60%, left atrial size was at the upper limit of normal, dilated  inferior vena cava.  EKG showed sinus tachycardia with a rate of 130,  nonspecific changes.  Doppler of the left leg showed no DVT or other.  Chest x-ray on admission showed probable bronchitic  changes.  CT of the  left leg showed no DVT compatible with cellulitis, no soft tissue  abscess, or osteomyelitis.  CT angiogram of the chest showed no PE,  bilateral small pleural effusion with atelectasis, possible atypical  infection, fatty infiltration of the liver and mild splenomegaly, mildly  enlarged subcarinal lymph node.  Chest x-ray on June 23 showed slightly  worsening aeration.   HISTORY AND HOSPITAL COURSE:  Mr. Virani is a 45 year old white male  patient of [facility] who presented with left leg infection.  Please see H&P for details.  He had a temperature of 101.4 initially at  the [facility]gent [facility] Center at [facility].  His blood pressure was 74 and  when he was transported to the emergency room his blood pressure was  105/59.  After IV fluids, his pulse was 120-130, normal oxygen  saturation.  He had dry mucous membranes, clear lung sounds.  His heart  was rapid but regular.  He had cellulitis from the  knee to ankle  circumferentially without any drainage.  The patient was sleepy but  oriented and had a nonfocal exam.  She had multiple xanthoma over his  arms and legs.  He was admitted initially to telemetry, however, he  dropped his pressure again in the ER and was transferred to the  intensive care unit.  The patient was started on broad-spectrum  antibiotics on admission.  He required fluid resuscitation and Critical  Care Medicine was consulted.  He did have lactic acidosis and was felt  to have sepsis syndrome.  He also developed some pancytopenia and  Hematology-Oncology was consulted.  It was felt that this was response  to the infection and his CBC improved by the time of discharge.  He had  no bleeding with his thrombocytopenia.  He had no cough or shortness of  breath, but he did have tachypnea which was felt to be respiratory  compensation for his metabolic acidosis.  His cellulitis, pancytopenia,  and hypotension improved and at the time of discharge, his leg was much  less red, warm, and tender.  He had a normal blood pressure and his CBC  was improved.  He will need close followup next week.  A CBC should be  done and his leg should be rechecked.  He will also need to followup  with Dr. [name], as his hemoglobin A1c was elevated here.   Total time on the date of discharge is 45 minutes.      Corinna L. Lendell Caprice, MD  Electronically Signed     CLS/MEDQ  D:  09/21/2007  T:  09/21/2007  Job:  [id number]   cc:   Lavonda Jumbo, M.D.  [name], M.D.

## 2010-08-01 NOTE — Consult Note (Signed)
NAMEBRYTON, Juan [name] NO.:  [id number]   MEDICAL RECORD NO.:  [id number]          PATIENT TYPE:  INP   LOCATION:  2622                         FACILITY:  [facility]   PHYSICIAN:  Samul Dada, M.D.DATE OF BIRTH:  1965/07/05   DATE OF CONSULTATION:  09/08/2007  DATE OF DISCHARGE:                                 CONSULTATION   REASON FOR CONSULTATION:  Thrombocytopenia.   REFERRING PHYSICIAN:  Dr. Lendell Caprice   HISTORY OF PRESENT ILLNESS:  Mr. Juan Juan [name] is a 45 year old man asked to see  for evaluation of low platelet count.  He was admitted and September 07, 2007, directly from [facility] with left lower  extremity cellulitis, fever and chills, as well as lower extremity pain.  Platelet count on admission was 53,000.  Of note, Rocephin was while at  urgent care and September 07, 2007.  On admission, he received vancomycin and  Zosyn as well as on today's regimen.  Moreover, Heparin had been  initiated on 06/21 at 5000 units every 8 hours, but this was  discontinued since the platelet count continued to drop today at 49,000.  Other medications received in the hospital include Protonix and IV fluid  at 100 mL an hour of normal saline, after the bolus of a 1 L had been  initiated as an outpatient, when his blood pressure was found to be too  low.  No other labs are available at this time to compare but as per  patient report his platelet count back in December 2008 was 159,000.  The DIC panel shows no schistocytes, platelet count is low at 42,000, PT  17, INR 1.4, PTT 34, fibrinogen 752 and D-dimer 1.65.  Of note, the  patient carries a diagnosis of lymphoma since the 1990s which per his  report is DLCL, NHL involving the left tibia status post biopsy and 8  cycles of aggressive chemotherapy probably with ProMACE and CytaBOM at  the [facility] in [place] then treated with  localized radiation therapy.  Upon this admission, his status is  being  complicated with ARDS, sepsis and hypoxemia.  This is currently being  treated by the critical care medicine team.   PAST MEDICAL HISTORY:  1. History of severe hypertriglyceridemia.  2. History of non-Hodgkin's lymphoma as above.  3. Diabetes mellitus type 2.  4. Cellulitis of the left lower extremity - ARDS - sepsis, all during      his admission.  5. History of cellulitis on March 02, 2007, after a cat bite in the      right arm.  6. Full code surgery status post right inguinal hernia repair status      post left knee surgery status post left elbow surgery status post      tonsillectomy, remote.   ALLERGIES.:  GADOLINIUM causes vomiting.   MEDICATIONS:  Vancomycin, Zosyn, Tricor, Zetia, Colace, Protonix, IV  fluids, Crestor, Vicodin, Ambien, Tylenol, Phenergan, Senokot, Maalox,  Novolin.   REVIEW OF SYSTEMS:  See HPI for significant positives.  The patient had  a temperature  of up to 102 at home compounded by chills and night  sweats, shortness of breath, and left lower extremity pain and edema as  mentioned above.  He has poor appetite.  All these symptoms have been  present for about 2 days.   FAMILY HISTORY:  Unknown, the patient is adopted.   SOCIAL HISTORY:  The patient is married.  No tobacco or alcohol history.  Lives in [place].   PHYSICAL EXAM:  This is a well-developed, moderately obese 45 year old  white male, in moderate degree of discomfort due to respiratory  complaints.  Blood pressure 132/62.  Pulse 127.  Respirations 27.  Temperature 100.1.  Pulse oximetry 97% in 2 L.  Weight 95 kilograms.  HEENT:  Normocephalic, atraumatic.  Sclerae anicteric.  PERRL.  Oral  cavity without lesions or thrush.  NECK:  Supple.  No cervical or supraclavicular masses.  LUNGS:  Remarkable for a few expiratory rhonchi, otherwise clear to  auscultation bilaterally.  No axillary masses.  CARDIOVASCULAR:  Tachycardic, regular rate and rhythm without murmurs, rubs or  gallops.  ABDOMEN:  Soft, moderately obese, nontender.  Bowel sounds x4.  No  hepatosplenomegaly.  EXTREMITIES:  With no clubbing, cyanosis but there is left lower  extremity erythema,  edema with tenderness below the knee to the ankle,  no drainage seen in the area.  There is some tenderness in the left  groin area as well.  GU and RECTAL:  Deferred.  MUSCULOSKELETAL:  With no spinal tenderness.   LABS:  Hemoglobin 11.4, hematocrit 32.2, white count 4.9, platelets 49,  MCV 75.4, ANC 3.8, monocytes 0.2, lymphocytes 0.4, retic count is 38.9,  folic acid 7, E45 is 354, iron less than 10, ferritin 450, LDH 142,  fibrinogen 752, D-dimer 1.65, PT 34, PTT 17, INR 1.4.  Cortisol, lipase,  and amylase are all pending.  Sodium 137, potassium 4.3, BUN 16,  creatinine 1.22, glucose 277, total bilirubin 1.2, alkaline phosphatase  37, AST 35, ALT 35, total protein 5.8, albumin 3.1, calcium 9.3.  BNP  172.  Urinalysis negative.  Blood cultures negative.  CT angio on September 08, 2007, showed no PE but small bilateral pleural effusion suspicious  for atypical infection, fatty infiltrate in the liver, mild  splenomegaly, mildly enlarged 1.5 cm subcarinal lymph node with no other  lymphadenopathy seen.  No DVT.   ASSESSMENT/PLAN:  Dr. Arline Asp has seen and evaluated the patient and  reviewed the chart.  He had, on admission, a platelet count of 53, today  is now dropping to 41 in the setting of severe acute cellulitis of the  left lower leg as well as hypotension.  The patient had a platelet count  recorded at 189,000 in December 2008.  He denies any prior history or  knowledge of low platelets.  History is notable for diagnosis of the  diffuse large B cell non-Hodgkin's lymphoma, non-Hodgkin's lymphoma  involving the left tibia status post biopsy and 8 cycles of aggressive  chemotherapy at the [facility] then with localized  radiation therapy.  The patient's cellulitis may be in part  secondary to  the radiation therapy with no obvious other precipitating factors.  Inspection of the peripheral smear shows platelets to be generally  small.  Granulocytic cells look somewhat dyspoietic.  No histiocytosis.  The labs do not suggest DIC but could presume that the patient's  thrombocytopenia is in fact related to sepsis, infection, some degree of  DIC versus impaired marrow response.  Cannot exclude  MDS.  Will continue  to follow.  Transfuse with platelets as necessary.  Further  recommendations upon monitoring of these platelet count changes.  Thank  you very much for allowing [place] the opportunity to participate in the care  of Mr. Arney.     Marlowe Kays, P.A.      Samul Dada, M.D.  Electronically Signed   SW/MEDQ  D:  09/10/2007  T:  09/10/2007  Job:  [id number]   cc:   Barney Drain [facility]

## 2010-08-01 NOTE — Discharge Summary (Signed)
Juan [name], Juan [name] NO.:  [id number]   MEDICAL RECORD NO.:  [id number]          PATIENT TYPE:  INP   LOCATION:  5154                         FACILITY:  [facility]   PHYSICIAN:  Lucita Ferrara, MD         DATE OF BIRTH:  [birth year]   DATE OF ADMISSION:  03/02/2007  DATE OF DISCHARGE:  03/04/2007                               DISCHARGE SUMMARY   ADMISSION DIAGNOSES:  1. Cat bite an cellulitis on the right forearm.  2. Diabetes.  3. Hyperlipidemia.   DISCHARGE DIAGNOSES:  1. Right hand cellulitis, resolved with IV antibiotics.  2. Diabetes type 2, uncontrolled, better managed.  3. Hyperlipidemia, chronic and stable.   CONSULTATIONS:  Dr. Lanora Manis Mayerdierks with [facility],  Hand Surgery.   ADMISSION UPON DISCHARGE:  Stable.   PROCEDURES:  The patient had a x-ray of the hand which showed distal cap  lock STS, close cap lock suspicion for infection, no acute bony  findings, no cap lock FB seen, dated March 02, 2007.   BRIEF HISTORY OF PRESENT ILLNESS:  Juan [name] is a 45 year old Caucasian  male with a history of diabetes, hyperlipidemia, presents to the [facility] on March 02, 2007, after sustaining a cat bite on  his right hand.  The patient presented to the emergency room after a  significant amount of swelling but no fevers and chills.  The patient  was started on IV antibiotics with Unison.   HOSPITAL COURSE:  1. Right hand cellulitis improved markedly with IV Unison.  Hand      surgery was called in to evaluate for possible I&D.  Given that      there was marked improvement with antibiotics, the decision was      made not to proceed with any incision and drainage.  The patient's      cellulitis improved with increased range of motion and decreased      signs and symptoms for infection.  The patient will be discharged      home with Augmentin for 10 days.  2. Diabetes type 2.  The patient was started back up on his home  regimen, including Metformin 1000 mg b.i.d.  For coverage initially      at sensitive, sliding scale insulin and then given his elevated      sugars, a resistant sliding insulin was implemented.  3. For DVT prophylaxis, the patient was kept on Lovenox.  4. For history of hyperlipidemia, the patient was restarted on his      home medications including Tricor, Crestor and Zetia.  On the day      of discharge, the patient is afebrile, able to tolerate a regular      diet.  Temperature is 97.4, pulse 89, blood pressure 121/84, pulse      ox 98% on room air.  His hand has good range of motion.  He is able      to use adequate dexterity and able to ambulate without problems.      He is hemodynamically stable and will be  sent      home with his home medications prior to discharge, and also with      Augmentin 875/125 b.i.d. x 10 days.  To follow up with [facility], Dr. Delorise Shiner.  Out of work note was written from      December 14 to December 21.      Lucita Ferrara, MD  Electronically Signed     RR/MEDQ  D:  03/04/2007  T:  03/04/2007  Job:  [id number]

## 2010-12-14 LAB — URINALYSIS, ROUTINE W REFLEX MICROSCOPIC
Bilirubin Urine: NEGATIVE
Ketones, ur: NEGATIVE
Leukocytes, UA: NEGATIVE
Nitrite: NEGATIVE
Specific Gravity, Urine: 1.017
Urobilinogen, UA: 1
pH: 6

## 2010-12-14 LAB — COMPREHENSIVE METABOLIC PANEL
ALT: 33
ALT: 35
ALT: 37
Albumin: 2.5 — ABNORMAL LOW
Alkaline Phosphatase: 33 — ABNORMAL LOW
CO2: 23
Calcium: 9.2
Calcium: 9.5
Chloride: 106
Creatinine, Ser: 1.09
GFR calc Af Amer: >60
GFR calc non Af Amer: >60
Glucose, Bld: 154 — ABNORMAL HIGH
Glucose, Bld: 155 — ABNORMAL HIGH
Glucose, Bld: 205 — ABNORMAL HIGH
Potassium: 3.5
Sodium: 132 — ABNORMAL LOW
Sodium: 132 — ABNORMAL LOW
Sodium: 134 — ABNORMAL LOW
Total Protein: 5.4 — ABNORMAL LOW
Total Protein: 5.8 — ABNORMAL LOW

## 2010-12-14 LAB — CULTURE, BLOOD (ROUTINE X 2): Culture: NO GROWTH

## 2010-12-14 LAB — DIFFERENTIAL
Basophils Absolute: 0
Basophils Relative: 1
Basophils Relative: 1
Eosinophils Absolute: 0
Eosinophils Absolute: 0
Eosinophils Absolute: 0
Eosinophils Absolute: 0.1
Eosinophils Absolute: 0.2
Lymphocytes Relative: 14
Lymphs Abs: 0.4 — ABNORMAL LOW
Lymphs Abs: 0.4 — ABNORMAL LOW
Lymphs Abs: 0.7
Monocytes Absolute: 0.3
Monocytes Absolute: 0.6
Monocytes Relative: 5
Monocytes Relative: 8
Monocytes Relative: 9
Neutro Abs: 5.6
Neutrophils Relative %: 70
Neutrophils Relative %: 79 — ABNORMAL HIGH
Neutrophils Relative %: 87 — ABNORMAL HIGH

## 2010-12-14 LAB — CBC
HCT: 25.6 — ABNORMAL LOW
Hemoglobin: 10.2 — ABNORMAL LOW
Hemoglobin: 10.8 — ABNORMAL LOW
Hemoglobin: 11.4 — ABNORMAL LOW
Hemoglobin: 8.4 — ABNORMAL LOW
Hemoglobin: 8.6 — ABNORMAL LOW
Hemoglobin: 8.7 — ABNORMAL LOW
Hemoglobin: 8.7 — ABNORMAL LOW
Hemoglobin: 8.9 — ABNORMAL LOW
MCHC: 33.8
MCHC: 33.9
MCHC: 34.9
MCHC: 35.5
MCV: 74.7 — ABNORMAL LOW
MCV: 74.9 — ABNORMAL LOW
MCV: 75.2 — ABNORMAL LOW
RBC: 3.32 — ABNORMAL LOW
RBC: 3.41 — ABNORMAL LOW
RBC: 3.41 — ABNORMAL LOW
RBC: 3.44 — ABNORMAL LOW
RBC: 3.86 — ABNORMAL LOW
RBC: 4.01 — ABNORMAL LOW
RBC: 4.27
RDW: 16.8 — ABNORMAL HIGH
RDW: 16.9 — ABNORMAL HIGH
RDW: 17.2 — ABNORMAL HIGH
RDW: 17.3 — ABNORMAL HIGH
WBC: 3.4 — ABNORMAL LOW
WBC: 3.5 — ABNORMAL LOW
WBC: 3.7 — ABNORMAL LOW
WBC: 3.8 — ABNORMAL LOW
WBC: 4.9
WBC: 5.6

## 2010-12-14 LAB — MAGNESIUM: Magnesium: 1.8

## 2010-12-14 LAB — DIC (DISSEMINATED INTRAVASCULAR COAGULATION)PANEL
D-Dimer, Quant: 1.65 — ABNORMAL HIGH
INR: 1.4
Prothrombin Time: 14.6
Prothrombin Time: 17 — ABNORMAL HIGH
Smear Review: NONE SEEN
aPTT: 34

## 2010-12-14 LAB — BASIC METABOLIC PANEL
CO2: 25
Calcium: 10.3
Calcium: 10.5
Calcium: 9.3
Chloride: 107
Creatinine, Ser: 0.97
Creatinine, Ser: 1.22
GFR calc Af Amer: >60
GFR calc Af Amer: >60
GFR calc Af Amer: >60
GFR calc Af Amer: >60
GFR calc non Af Amer: >60
GFR calc non Af Amer: >60
Potassium: 3.8
Sodium: 135
Sodium: 137
Sodium: 138

## 2010-12-14 LAB — BLOOD GAS, ARTERIAL
Acid-base deficit: 4.8 — ABNORMAL HIGH
Bicarbonate: 19.9 — ABNORMAL LOW
O2 Content: 2
O2 Saturation: 91.6
Patient temperature: 99.8
pO2, Arterial: 68 — ABNORMAL LOW

## 2010-12-14 LAB — AMYLASE: Amylase: 49

## 2010-12-14 LAB — CARDIAC PANEL(CRET KIN+CKTOT+MB+TROPI)
CK, MB: 0.6
CK, MB: 1.1
Relative Index: 0.6
Relative Index: 1
Total CK: 115

## 2010-12-14 LAB — B-NATRIURETIC PEPTIDE (CONVERTED LAB): Pro B Natriuretic peptide (BNP): 172 — ABNORMAL HIGH

## 2010-12-14 LAB — LACTIC ACID, PLASMA: Lactic Acid, Venous: 2.4 — ABNORMAL HIGH

## 2010-12-14 LAB — FERRITIN: Ferritin: 450 — ABNORMAL HIGH (ref 22–322)

## 2010-12-14 LAB — PROTEIN ELECTROPH W RFLX QUANT IMMUNOGLOBULINS
Albumin ELP: 48.6 — ABNORMAL LOW
Alpha-2-Globulin: 17.2 — ABNORMAL HIGH
Beta Globulin: 5.7
Total Protein ELP: 5.3 — ABNORMAL LOW

## 2010-12-14 LAB — HEMOGLOBIN A1C
Hgb A1c MFr Bld: 9.8 — ABNORMAL HIGH
Mean Plasma Glucose: 272

## 2010-12-14 LAB — IRON AND TIBC: UIBC: 198

## 2010-12-14 LAB — FOLATE: Folate: 7

## 2010-12-14 LAB — PHOSPHORUS: Phosphorus: 1.5 — ABNORMAL LOW

## 2010-12-14 LAB — LIPASE, BLOOD: Lipase: 17

## 2010-12-14 LAB — PROTIME-INR: INR: 1.3

## 2010-12-22 LAB — DIFFERENTIAL
Basophils Absolute: 0
Eosinophils Relative: 2
Lymphocytes Relative: 26
Lymphs Abs: 1.4
Neutrophils Relative %: 63

## 2010-12-22 LAB — CBC
HCT: 31.6 — ABNORMAL LOW
HCT: 34.3 — ABNORMAL LOW
MCHC: 35
MCV: 72.6 — ABNORMAL LOW
MCV: 74.3 — ABNORMAL LOW
Platelets: 101 — ABNORMAL LOW
Platelets: 67 — ABNORMAL LOW
RBC: 4.61
RDW: 16.2 — ABNORMAL HIGH
WBC: 5.3

## 2010-12-22 LAB — COMPREHENSIVE METABOLIC PANEL
AST: 14
Albumin: 3.2 — ABNORMAL LOW
Alkaline Phosphatase: 52
BUN: 8
CO2: 26
Chloride: 100
Creatinine, Ser: 0.79
GFR calc Af Amer: >60
GFR calc non Af Amer: >60
Potassium: 4.3
Total Bilirubin: 1.3 — ABNORMAL HIGH

## 2010-12-22 LAB — BASIC METABOLIC PANEL
BUN: 8
CO2: 23
GFR calc non Af Amer: >60
Glucose, Bld: 249 — ABNORMAL HIGH
Potassium: 4.7

## 2010-12-25 LAB — CULTURE, BLOOD (ROUTINE X 2): Culture: NO GROWTH

## 2010-12-25 LAB — CBC
MCHC: 34.2
RDW: 16.6 — ABNORMAL HIGH

## 2010-12-25 LAB — COMPREHENSIVE METABOLIC PANEL
ALT: 23
AST: 41 — ABNORMAL HIGH
Albumin: 4.2
Alkaline Phosphatase: 68
BUN: 17
Chloride: 98
GFR calc Af Amer: >60
Potassium: 5.3 — ABNORMAL HIGH
Sodium: 135
Total Protein: 7.4

## 2010-12-25 LAB — DIFFERENTIAL
Basophils Absolute: 0
Basophils Relative: 1
Lymphocytes Relative: 16
Neutro Abs: 6.6
Neutrophils Relative %: 77

## 2011-06-15 ENCOUNTER — Encounter (HOSPITAL_COMMUNITY): Payer: Self-pay | Admitting: *Deleted

## 2011-06-15 ENCOUNTER — Emergency Department (HOSPITAL_COMMUNITY): Payer: Managed Care, Other (non HMO)

## 2011-06-15 ENCOUNTER — Emergency Department (HOSPITAL_COMMUNITY)
Admission: EM | Admit: 2011-06-15 | Discharge: 2011-06-15 | Disposition: A | Discharge status: Home / Self Care | Payer: Managed Care, Other (non HMO) | Source: Home / Self Care | Attending: Emergency Medicine | Admitting: Emergency Medicine

## 2011-06-15 ENCOUNTER — Other Ambulatory Visit: Payer: Self-pay

## 2011-06-15 DIAGNOSIS — Z79899 Other long term (current) drug therapy: Secondary | ICD-10-CM | POA: Insufficient documentation

## 2011-06-15 DIAGNOSIS — Z794 Long term (current) use of insulin: Secondary | ICD-10-CM | POA: Insufficient documentation

## 2011-06-15 DIAGNOSIS — R112 Nausea with vomiting, unspecified: Secondary | ICD-10-CM | POA: Insufficient documentation

## 2011-06-15 DIAGNOSIS — R066 Hiccough: Secondary | ICD-10-CM

## 2011-06-15 DIAGNOSIS — R7401 Elevation of levels of liver transaminase levels: Secondary | ICD-10-CM | POA: Insufficient documentation

## 2011-06-15 DIAGNOSIS — R748 Abnormal levels of other serum enzymes: Secondary | ICD-10-CM

## 2011-06-15 DIAGNOSIS — K7689 Other specified diseases of liver: Secondary | ICD-10-CM | POA: Insufficient documentation

## 2011-06-15 DIAGNOSIS — R7402 Elevation of levels of lactic acid dehydrogenase (LDH): Secondary | ICD-10-CM | POA: Insufficient documentation

## 2011-06-15 DIAGNOSIS — R109 Unspecified abdominal pain: Secondary | ICD-10-CM | POA: Insufficient documentation

## 2011-06-15 HISTORY — DX: Acute pancreatitis without necrosis or infection, unspecified: K85.90

## 2011-06-15 LAB — DIFFERENTIAL
Basophils Absolute: 0 10*3/uL (ref 0.0–0.1)
Eosinophils Absolute: 0 10*3/uL (ref 0.0–0.7)
Eosinophils Relative: 0 % (ref 0–5)
Lymphocytes Relative: 12 % (ref 12–46)
Neutrophils Relative %: 82 % — ABNORMAL HIGH (ref 43–77)

## 2011-06-15 LAB — COMPREHENSIVE METABOLIC PANEL
ALT: 83 U/L — ABNORMAL HIGH (ref 0–53)
AST: 77 U/L — ABNORMAL HIGH (ref 0–37)
CO2: 32 mEq/L (ref 19–32)
Calcium: 12.2 mg/dL — ABNORMAL HIGH (ref 8.4–10.5)
Sodium: 140 mEq/L (ref 135–145)
Total Protein: 8.7 g/dL — ABNORMAL HIGH (ref 6.0–8.3)

## 2011-06-15 LAB — GLUCOSE, CAPILLARY: Glucose-Capillary: 213 mg/dL — ABNORMAL HIGH (ref 70–99)

## 2011-06-15 LAB — CBC
MCV: 76.4 fL — ABNORMAL LOW (ref 78.0–100.0)
Platelets: 174 10*3/uL (ref 150–400)
RDW: 15.8 % — ABNORMAL HIGH (ref 11.5–15.5)
WBC: 9.4 10*3/uL (ref 4.0–10.5)

## 2011-06-15 MED ORDER — PROMETHAZINE HCL 25 MG/ML IJ SOLN
12.5000 mg | Freq: Once | INTRAMUSCULAR | Status: AC
  Administered 2011-06-15: 12.5 mg via INTRAVENOUS
  Filled 2011-06-15: qty 1

## 2011-06-15 MED ORDER — ONDANSETRON HCL 4 MG/2ML IJ SOLN
4.0000 mg | Freq: Once | INTRAMUSCULAR | Status: AC
  Administered 2011-06-15: 4 mg via INTRAVENOUS

## 2011-06-15 MED ORDER — ONDANSETRON HCL 4 MG/2ML IJ SOLN
INTRAMUSCULAR | Status: AC
  Filled 2011-06-15: qty 2

## 2011-06-15 MED ORDER — LORAZEPAM 2 MG/ML IJ SOLN
1.0000 mg | Freq: Once | INTRAMUSCULAR | Status: AC
  Administered 2011-06-15: 1 mg via INTRAVENOUS
  Filled 2011-06-15: qty 1

## 2011-06-15 MED ORDER — SODIUM CHLORIDE 0.9 % IV BOLUS (SEPSIS)
1000.0000 mL | Freq: Once | INTRAVENOUS | Status: AC
  Administered 2011-06-15: 1000 mL via INTRAVENOUS

## 2011-06-15 MED ORDER — ONDANSETRON 4 MG PO TBDP: 4.0000 mg | ORAL_TABLET | Freq: Three times a day (TID) | ORAL | Status: AC | PRN

## 2011-06-15 MED ORDER — HYDROMORPHONE HCL PF 1 MG/ML IJ SOLN
1.0000 mg | Freq: Once | INTRAMUSCULAR | Status: AC
  Administered 2011-06-15: 1 mg via INTRAVENOUS
  Filled 2011-06-15: qty 1

## 2011-06-15 MED ORDER — CHLORPROMAZINE HCL 25 MG PO TABS: 25.0000 mg | ORAL_TABLET | Freq: Three times a day (TID) | ORAL | Status: DC

## 2011-06-15 MED ORDER — PROCHLORPERAZINE MALEATE 10 MG PO TABS
10.0000 mg | ORAL_TABLET | Freq: Once | ORAL | Status: AC
  Administered 2011-06-15: 10 mg via ORAL
  Filled 2011-06-15 (×2): qty 1

## 2011-06-15 MED ORDER — ONDANSETRON HCL 4 MG/2ML IJ SOLN
4.0000 mg | Freq: Once | INTRAMUSCULAR | Status: AC
  Administered 2011-06-15: 4 mg via INTRAVENOUS
  Filled 2011-06-15: qty 2

## 2011-06-15 NOTE — ED Notes (Signed)
Thorazine 25 mg tab PO TID called into [facility] on [address] at [phone] because [facility] did not have this medication

## 2011-06-15 NOTE — ED Provider Notes (Signed)
History     CSN: [id number]  Arrival date & time 06/15/11  9147   First MD Initiated Contact with Patient 06/15/11 [phone]      Chief Complaint  Patient presents with  . Abdominal Pain  . Emesis  . Pancreatitis    (Consider location/radiation/quality/duration/timing/severity/associated sxs/prior treatment) HPI  Patient with history of diabetes type 2 insulin dependent, and high triglycerides that required hospitalization in July 2012 for hypertriglyceride induced pancreatitis with complications requiring rehospitalization for hicupping and vomiting presents to ER complaining of acute onset abdominal pain that began 5 days ago that has been waxing and waning but with resolution of pain yesterday stating "I have been pain free yesterday and today" but states acute onset hiccups that began last night and have been intermittent every hour with associated vomiting but then will temporarily stop and then recur. Patient complaining of ongoing persistent nausea. Patient states "I was prescribed some pill last year for the hiccups but I could not find it and don't even know if it would have been good." Patient denies fevers, chills, CP, SOB, current abdominal pain, diarrhea, HA, dizziness, recent sick contacts. Patient states that when he was having pain, it was in RUQ and epigastric and similar to the pain he had when he had pancreatitis. Patient states his triglycerides have been as low as 600 and as high as 1500 over the last year and managed by PCP Dr. [name]. Patient denies aggravating or alleviating factors.   Past Medical History  Diagnosis Date  . Pancreatitis   . Cancer   . Diabetes mellitus     History reviewed. No pertinent past surgical history.  History reviewed. No pertinent family history.  History  Substance Use Topics  . Smoking status: Never Smoker   . Smokeless tobacco: Not on file  . Alcohol Use: No      Review of Systems  All other systems reviewed and are  negative.    Allergies  Contrast media  Home Medications   Current Outpatient Rx  Name Route Sig Dispense Refill  . FENOFIBRATE 160 MG PO TABS Oral Take 160 mg by mouth daily.    . INSULIN ISOPHANE HUMAN 100 UNIT/ML [place] SUSP Subcutaneous Inject 35-75 Units into the skin 3 (three) times daily with meals. 60 units every morning, 35 units with lunch, 75 units with dinner.    . INSULIN REGULAR HUMAN (CONC) 500 UNIT/ML [place] SOLN Subcutaneous Inject 25 Units into the skin 3 (three) times daily with meals.     Marland Kitchen METFORMIN HCL 1000 MG PO TABS Oral Take 1,000 mg by mouth 2 (two) times daily with a meal.    . NAPROXEN SODIUM 220 MG PO TABS Oral Take 220 mg by mouth daily as needed. Pain.    Marland Kitchen RAMIPRIL 5 MG PO CAPS Oral Take 5 mg by mouth daily.    Marland Kitchen ROSUVASTATIN CALCIUM 20 MG PO TABS Oral Take 20 mg by mouth daily.      BP 138/87  Pulse 96  Temp(Src) 98.5 F (36.9 C) (Oral)  Resp 12  SpO2 96%  Physical Exam  Nursing note and vitals reviewed. Constitutional: He is oriented to person, place, and time. He appears well-developed and well-nourished. No distress.  HENT:  Head: Normocephalic and atraumatic.  Eyes: Conjunctivae are normal. Pupils are equal, round, and reactive to light.  Neck: Normal range of motion. Neck supple.  Cardiovascular: Normal rate, regular rhythm, normal heart sounds and intact distal pulses.  Exam reveals no gallop and  no friction rub.   No murmur heard. Pulmonary/Chest: Effort normal and breath sounds normal. No respiratory distress. He has no wheezes. He has no rales. He exhibits no tenderness.  Abdominal: Soft. Bowel sounds are normal. He exhibits no distension and no mass. There is no tenderness. There is no rebound and no guarding.  Musculoskeletal: Normal range of motion. He exhibits no edema and no tenderness.  Neurological: He is alert and oriented to person, place, and time.  Skin: Skin is warm and dry. No rash noted. He is not diaphoretic. No erythema.    Psychiatric: He has a normal mood and affect.    ED Course  Procedures (including critical care time)  IV dilaudid, zofran, ativan, fluids  Labs Reviewed  CBC - Abnormal; Notable for the following:    Hemoglobin 12.7 (*)    HCT 37.9 (*)    MCV 76.4 (*)    MCH 25.6 (*)    RDW 15.8 (*)    All other components within normal limits  DIFFERENTIAL - Abnormal; Notable for the following:    Neutrophils Relative 82 (*)    All other components within normal limits  COMPREHENSIVE METABOLIC PANEL - Abnormal; Notable for the following:    Chloride 95 (*)    Glucose, Bld 220 (*)    Calcium 12.2 (*)    Total Protein 8.7 (*)    AST 77 (*)    ALT 83 (*)    GFR calc non Af Amer 90 (*)    All other components within normal limits  LIPASE, BLOOD - Abnormal; Notable for the following:    Lipase 74 (*)    All other components within normal limits  GLUCOSE, CAPILLARY - Abnormal; Notable for the following:    Glucose-Capillary 213 (*)    All other components within normal limits  ETHANOL   US Abdomen Complete  06/15/2011  *RADIOLOGY REPORT*  Clinical Data:  Right upper quadrant pain, nausea  COMPLETE ABDOMINAL ULTRASOUND  Comparison:  09/18/2009  Findings:  Gallbladder:  No gallstones, gallbladder wall thickening, or pericholecystic fluid. No sonographic Murphy's sign  Common bile duct:  Measures 4 mm in diameter within normal limits.  Liver:  No focal lesion identified. Again noted diffuse increased echogenicity of the liver consistent with fatty infiltration.  IVC:  Not visualized due to abundant bowel gas.  Pancreas:  Not visualized due to abundant bowel gas  Spleen:  Measures 17.5 by cm in length.  The splenic volume measures 1418.7 ml  Right Kidney:  Measures 15.2 cm in length.  No hydronephrosis or diagnostic renal calculus.  A cyst in mid pole measures 3.1 x 4.1 cm.  Left Kidney:  Measures 15.6 cm in length.  A small cyst mid to upper pole measures 1 x 1.1 cm.  Abdominal aorta:  Not visualized  due to abundant bowel gas.  IMPRESSION:  1.  No gallstones are noted within gallbladder.  Normal CBD. 2.  Again noted fatty infiltration of the liver. 3.  Significant splenomegaly. 4.  Bilateral renal cyst.  No hydronephrosis or diagnostic renal calculus.  Original Report Authenticated By: Natasha Mead, M.D.     1. Hiccups   2. Nausea and vomiting   3. Elevated liver enzymes       MDM  Patient's abdomen is soft and nontender stating that he's had no abdominal pain for greater than 24 hours. Patient's nausea and hiccups have resolved. Tolerating fluids well. Patient is very agreeable to following up with primary care physician this  coming week for discussion of recurrent hiccups and nausea as well as to discuss his mild elevation in lipase and his elevation in hepatic enzymes. Abdomen is benign. VSS and patient is afebrile. Nontoxic-appearing.        Jenness Corner, [place] 06/15/11 1552

## 2011-06-15 NOTE — ED Notes (Signed)
Pt presents with persistent n/v/hiccups/abd pain. He states that he has not been able to tolerate any po intake due to the pain. Iv started and labs obtained. Pt seems to start to have the hiccups when staff is in the room. When staff is outside walking past him, no hiccups are heard. As soon as you enter the room he will start to violently hiccup and sometimes vomit. Pt made aware of plan of care and need to follow up with pcp gi md in order to try to figure out a dx for this chronic issue. Pt educated on brat diet, consistency with meds, and importance of specific follow up. Pt voiced understanding.

## 2011-06-15 NOTE — Discharge Instructions (Signed)
Take Thorazine as directed to help prevent hiccups. Use Zofran as needed for nausea. Followup with Dr. Cornelius Moras at the beginning of this next week for further evaluation and management of recurrent hiccups and nausea and vomiting however return to emergency department for any changing or worsening symptoms to include but not limited to intractable hiccups with vomiting or increasing abdominal pain.  Is also very important to followup with Dr. Cornelius Moras for recheck of your liver enzymes and your lipase which were both mildly elevated.

## 2011-06-15 NOTE — ED Notes (Signed)
Pt reports hx of pancreatitis, states last night began vomiting every hour. States unable to keep anything down. Pt with hiccups. Abdomen appears distended.

## 2011-06-16 ENCOUNTER — Other Ambulatory Visit: Payer: Self-pay

## 2011-06-16 ENCOUNTER — Emergency Department (HOSPITAL_COMMUNITY): Payer: Managed Care, Other (non HMO)

## 2011-06-16 ENCOUNTER — Encounter (HOSPITAL_COMMUNITY): Payer: Self-pay | Admitting: *Deleted

## 2011-06-16 ENCOUNTER — Observation Stay (HOSPITAL_COMMUNITY)
Admission: EM | Admit: 2011-06-16 | Discharge: 2011-06-17 | DRG: 438 | Disposition: A | Discharge status: Home / Self Care | Payer: Managed Care, Other (non HMO) | Attending: Internal Medicine | Admitting: Internal Medicine

## 2011-06-16 DIAGNOSIS — K7689 Other specified diseases of liver: Secondary | ICD-10-CM | POA: Insufficient documentation

## 2011-06-16 DIAGNOSIS — R161 Splenomegaly, not elsewhere classified: Secondary | ICD-10-CM | POA: Diagnosis present

## 2011-06-16 DIAGNOSIS — D649 Anemia, unspecified: Secondary | ICD-10-CM

## 2011-06-16 DIAGNOSIS — E119 Type 2 diabetes mellitus without complications: Secondary | ICD-10-CM | POA: Diagnosis present

## 2011-06-16 DIAGNOSIS — E785 Hyperlipidemia, unspecified: Secondary | ICD-10-CM | POA: Diagnosis present

## 2011-06-16 DIAGNOSIS — Z794 Long term (current) use of insulin: Secondary | ICD-10-CM | POA: Diagnosis present

## 2011-06-16 DIAGNOSIS — D696 Thrombocytopenia, unspecified: Secondary | ICD-10-CM | POA: Diagnosis present

## 2011-06-16 DIAGNOSIS — K861 Other chronic pancreatitis: Principal | ICD-10-CM | POA: Diagnosis present

## 2011-06-16 DIAGNOSIS — E875 Hyperkalemia: Secondary | ICD-10-CM | POA: Diagnosis present

## 2011-06-16 DIAGNOSIS — E1100 Type 2 diabetes mellitus with hyperosmolarity without nonketotic hyperglycemic-hyperosmolar coma (NKHHC): Secondary | ICD-10-CM | POA: Insufficient documentation

## 2011-06-16 DIAGNOSIS — E860 Dehydration: Secondary | ICD-10-CM | POA: Diagnosis present

## 2011-06-16 DIAGNOSIS — N179 Acute kidney failure, unspecified: Secondary | ICD-10-CM | POA: Diagnosis present

## 2011-06-16 DIAGNOSIS — Z79899 Other long term (current) drug therapy: Secondary | ICD-10-CM | POA: Insufficient documentation

## 2011-06-16 DIAGNOSIS — R718 Other abnormality of red blood cells: Secondary | ICD-10-CM | POA: Diagnosis present

## 2011-06-16 DIAGNOSIS — K760 Fatty (change of) liver, not elsewhere classified: Secondary | ICD-10-CM | POA: Diagnosis present

## 2011-06-16 DIAGNOSIS — N281 Cyst of kidney, acquired: Secondary | ICD-10-CM

## 2011-06-16 DIAGNOSIS — R739 Hyperglycemia, unspecified: Secondary | ICD-10-CM

## 2011-06-16 DIAGNOSIS — Z87898 Personal history of other specified conditions: Secondary | ICD-10-CM | POA: Insufficient documentation

## 2011-06-16 DIAGNOSIS — D509 Iron deficiency anemia, unspecified: Secondary | ICD-10-CM | POA: Diagnosis present

## 2011-06-16 HISTORY — DX: Cyst of kidney, acquired: N28.1

## 2011-06-16 HISTORY — DX: Anemia, unspecified: D64.9

## 2011-06-16 HISTORY — DX: Hyperlipidemia, unspecified: E78.5

## 2011-06-16 HISTORY — DX: Non-Hodgkin lymphoma, unspecified, unspecified site: C85.90

## 2011-06-16 HISTORY — DX: Fatty (change of) liver, not elsewhere classified: K76.0

## 2011-06-16 LAB — CBC
HCT: 24.3 % — ABNORMAL LOW (ref 39.0–52.0)
Hemoglobin: 7.6 g/dL — ABNORMAL LOW (ref 13.0–17.0)
MCH: 24.9 pg — ABNORMAL LOW (ref 26.0–34.0)
MCV: 79.7 fL (ref 78.0–100.0)
RBC: 3.05 MIL/uL — ABNORMAL LOW (ref 4.22–5.81)

## 2011-06-16 LAB — BLOOD GAS, ARTERIAL
Bicarbonate: 19.9 mEq/L — ABNORMAL LOW (ref 20.0–24.0)
FIO2: 0.21 %
Patient temperature: 98.6
pH, Arterial: 7.373 (ref 7.350–7.450)

## 2011-06-16 LAB — DIFFERENTIAL
Basophils Relative: 1 % (ref 0–1)
Eosinophils Relative: 0 % (ref 0–5)
Lymphs Abs: 1.8 10*3/uL (ref 0.7–4.0)
Monocytes Absolute: 0.9 10*3/uL (ref 0.1–1.0)
Monocytes Relative: 8 % (ref 3–12)
Neutro Abs: 8.7 10*3/uL — ABNORMAL HIGH (ref 1.7–7.7)

## 2011-06-16 LAB — COMPREHENSIVE METABOLIC PANEL
Alkaline Phosphatase: 55 U/L (ref 39–117)
BUN: 68 mg/dL — ABNORMAL HIGH (ref 6–23)
CO2: 21 mEq/L (ref 19–32)
Chloride: 105 mEq/L (ref 96–112)
Creatinine, Ser: 1.42 mg/dL — ABNORMAL HIGH (ref 0.50–1.35)
GFR calc non Af Amer: 59 mL/min — ABNORMAL LOW (ref >90)
Potassium: 5.8 mEq/L — ABNORMAL HIGH (ref 3.5–5.1)
Total Bilirubin: 0.3 mg/dL (ref 0.3–1.2)

## 2011-06-16 LAB — GLUCOSE, CAPILLARY
Glucose-Capillary: 368 mg/dL — ABNORMAL HIGH (ref 70–99)
Glucose-Capillary: 316 mg/dL — ABNORMAL HIGH (ref 70–99)
Glucose-Capillary: 234 mg/dL — ABNORMAL HIGH (ref 70–99)

## 2011-06-16 LAB — URINALYSIS, ROUTINE W REFLEX MICROSCOPIC
Nitrite: NEGATIVE
Specific Gravity, Urine: 1.031 — ABNORMAL HIGH (ref 1.005–1.030)
Urobilinogen, UA: 0.2 mg/dL (ref 0.0–1.0)

## 2011-06-16 LAB — VITAMIN B12: Vitamin B-12: 539 pg/mL (ref 211–911)

## 2011-06-16 LAB — POTASSIUM: Potassium: 5.4 mEq/L — ABNORMAL HIGH (ref 3.5–5.1)

## 2011-06-16 LAB — LIPASE, BLOOD: Lipase: 200 U/L — ABNORMAL HIGH (ref 11–59)

## 2011-06-16 LAB — IRON AND TIBC
Iron: 47 ug/dL (ref 42–135)
Saturation Ratios: 21 % (ref 20–55)

## 2011-06-16 LAB — RETICULOCYTES
RBC.: 2.7 MIL/uL — ABNORMAL LOW (ref 4.22–5.81)
Retic Count, Absolute: 67.5 10*3/uL (ref 19.0–186.0)

## 2011-06-16 LAB — FOLATE: Folate: 10.7 ng/mL

## 2011-06-16 MED ORDER — ACETAMINOPHEN 325 MG PO TABS: 650.0000 mg | ORAL_TABLET | Freq: Four times a day (QID) | ORAL | Status: DC | PRN

## 2011-06-16 MED ORDER — SODIUM CHLORIDE 0.9 % IV SOLN
350.0000 mg | Freq: Once | INTRAVENOUS | Status: AC
  Administered 2011-06-16: 350 mg via INTRAVENOUS
  Filled 2011-06-16: qty 3.5

## 2011-06-16 MED ORDER — FENOFIBRATE 160 MG PO TABS
160.0000 mg | ORAL_TABLET | Freq: Every day | ORAL | Status: DC
  Administered 2011-06-16 – 2011-06-17 (×2): 160 mg via ORAL
  Filled 2011-06-16 (×3): qty 1

## 2011-06-16 MED ORDER — MORPHINE SULFATE 2 MG/ML IJ SOLN: 2.0000 mg | INTRAMUSCULAR | Status: DC | PRN

## 2011-06-16 MED ORDER — INSULIN ASPART 100 UNIT/ML ~~LOC~~ SOLN
SUBCUTANEOUS | Status: AC
  Administered 2011-06-16: 10 [IU] via INTRAVENOUS
  Filled 2011-06-16: qty 1

## 2011-06-16 MED ORDER — ACETAMINOPHEN 650 MG RE SUPP: 650.0000 mg | Freq: Four times a day (QID) | RECTAL | Status: DC | PRN

## 2011-06-16 MED ORDER — INSULIN REGULAR HUMAN 100 UNIT/ML IJ SOLN: 10.0000 [IU] | Freq: Once | INTRAMUSCULAR | Status: DC

## 2011-06-16 MED ORDER — ATORVASTATIN CALCIUM 40 MG PO TABS
40.0000 mg | ORAL_TABLET | Freq: Every day | ORAL | Status: DC
  Filled 2011-06-16 (×2): qty 1

## 2011-06-16 MED ORDER — INSULIN ASPART 100 UNIT/ML ~~LOC~~ SOLN
0.0000 [IU] | SUBCUTANEOUS | Status: DC
  Administered 2011-06-16: 4 [IU] via SUBCUTANEOUS
  Administered 2011-06-16: 10 [IU] via INTRAVENOUS
  Administered 2011-06-16: 7 [IU] via SUBCUTANEOUS
  Administered 2011-06-17: 4 [IU] via SUBCUTANEOUS
  Administered 2011-06-17 (×2): 7 [IU] via SUBCUTANEOUS

## 2011-06-16 MED ORDER — SODIUM CHLORIDE 0.9 % IV BOLUS (SEPSIS)
1000.0000 mL | Freq: Once | INTRAVENOUS | Status: AC
  Administered 2011-06-16: 1000 mL via INTRAVENOUS

## 2011-06-16 MED ORDER — INSULIN GLARGINE 100 UNIT/ML ~~LOC~~ SOLN
50.0000 [IU] | Freq: Two times a day (BID) | SUBCUTANEOUS | Status: DC
  Administered 2011-06-16 – 2011-06-17 (×2): 50 [IU] via SUBCUTANEOUS

## 2011-06-16 MED ORDER — SODIUM CHLORIDE 0.9 % IV SOLN
Freq: Once | INTRAVENOUS | Status: AC
  Administered 2011-06-16: 08:00:00 via INTRAVENOUS

## 2011-06-16 MED ORDER — INSULIN ASPART 100 UNIT/ML ~~LOC~~ SOLN: 10.0000 [IU] | Freq: Once | SUBCUTANEOUS | Status: DC

## 2011-06-16 MED ORDER — ONDANSETRON HCL 4 MG PO TABS: 4.0000 mg | ORAL_TABLET | Freq: Four times a day (QID) | ORAL | Status: DC | PRN

## 2011-06-16 MED ORDER — SODIUM CHLORIDE 0.9 % IV SOLN
INTRAVENOUS | Status: DC
  Administered 2011-06-16: 250 mL/h via INTRAVENOUS
  Administered 2011-06-17: 125 mL/h via INTRAVENOUS

## 2011-06-16 MED ORDER — SODIUM CHLORIDE 0.9 % IV SOLN
Freq: Once | INTRAVENOUS | Status: AC
  Administered 2011-06-16: 10:00:00 via INTRAVENOUS

## 2011-06-16 MED ORDER — ONDANSETRON HCL 4 MG/2ML IJ SOLN: 4.0000 mg | Freq: Four times a day (QID) | INTRAMUSCULAR | Status: DC | PRN

## 2011-06-16 MED ORDER — SODIUM CHLORIDE 0.9 % IV SOLN: INTRAVENOUS | Status: DC

## 2011-06-16 MED ORDER — SODIUM BICARBONATE 8.4 % IV SOLN
25.0000 meq | Freq: Once | INTRAVENOUS | Status: AC
  Administered 2011-06-16: 25 meq via INTRAVENOUS
  Filled 2011-06-16: qty 50

## 2011-06-16 MED ORDER — INSULIN ASPART 100 UNIT/ML ~~LOC~~ SOLN
10.0000 [IU] | Freq: Once | SUBCUTANEOUS | Status: AC
  Administered 2011-06-16: 10 [IU] via SUBCUTANEOUS
  Filled 2011-06-16: qty 1

## 2011-06-16 MED ORDER — OXYCODONE HCL 5 MG PO TABS: 5.0000 mg | ORAL_TABLET | ORAL | Status: DC | PRN

## 2011-06-16 NOTE — ED Notes (Signed)
Attempted to call report. Nurse not available 

## 2011-06-16 NOTE — ED Notes (Signed)
EMS called to home.  Found patient seated on couch with complaints of "not feeling well" He also complains of dizziness with "HI" reading on glucometer. EMS CBG = 484.  Patient is alert and oriented x3.  He denies any pain or nausea with EMS

## 2011-06-16 NOTE — ED Notes (Signed)
Patient was seen yesterday at [facility] for dizziness and vomiting.  He stated to EMS "I just don't feel right"   His CBG read "HI" on his monitor.

## 2011-06-16 NOTE — ED Notes (Signed)
Made nurse aware of the heartrate

## 2011-06-16 NOTE — ED Provider Notes (Signed)
History     CSN: [id number]  Arrival date & time 06/16/11  0707   First MD Initiated Contact with Patient 06/16/11 [phone]      Chief Complaint  Patient presents with  . Hyperglycemia    (Consider location/radiation/quality/duration/timing/severity/associated sxs/prior treatment) HPI Comments: The patient is a 46 year old man with type 2 diabetes. He also has pancreatitis based on hyperlipidemia. He felt the onset of pancreatitis about a week ago. He treated this at home. Yesterday he he had uncontrolled hiccups. He was seen at [facility] and was prescribed Thorazine. An ultrasound whose abdomen was negative. Last night around 1 AM his blood sugar was 485. He gave himself 40 units of Humulin R. insulin. An hour later his blood sugar was 500 he gave himself 40 units or a male of R. insulin been around 5:30 this morning it was 495 he gave himself an additional 4 units of Humulin R insulin. At that point he called EMS which brought to [facility] long ED.  Patient is a 46 y.o. male presenting with diabetes problem. The history is provided by the patient, the spouse and medical records. No language interpreter was used.  Diabetes He presents for his follow-up diabetic visit. He has type 2 diabetes mellitus. The initial diagnosis of diabetes was made 1 day ago. His disease course has been worsening. There are no hypoglycemic associated symptoms. (Hiccoughs.) Symptoms are worsening. Symptoms have been present for 1 day. Risk factors for coronary artery disease include diabetes mellitus, dyslipidemia and male sex. Current diabetic treatment includes insulin injections and oral agent (dual therapy). He is compliant with treatment all of the time. He is currently taking insulin pre-lunch and pre-dinner. Insulin injections are given by patient. His weight is stable. He is following a diabetic diet. His home blood glucose trend is increasing rapidly. His highest blood glucose is >200 mg/dl. An ACE  inhibitor/angiotensin II receptor blocker is being taken.    Past Medical History  Diagnosis Date  . Pancreatitis   . Cancer   . Diabetes mellitus   . Hyperlipidemia     Past Surgical History  Procedure Date  . Nasal septum surgery   . Knee surgery   . Tonsillectomy   . Elbow surgery     History reviewed. No pertinent family history.  History  Substance Use Topics  . Smoking status: Never Smoker   . Smokeless tobacco: Not on file  . Alcohol Use: No      Review of Systems  Unable to perform ROS Constitutional: Negative.   HENT: Negative.   Respiratory: Negative.   Cardiovascular: Negative.   Gastrointestinal: Positive for abdominal distention. Negative for nausea, vomiting and blood in stool.       Hiccoughs, anorexia.  Genitourinary: Negative.   Musculoskeletal: Negative.   Skin: Negative.   Neurological: Negative.   Psychiatric/Behavioral: Negative.     Allergies  Contrast media  Home Medications   Current Outpatient Rx  Name Route Sig Dispense Refill  . CHLORPROMAZINE HCL 25 MG PO TABS Oral Take 1 tablet (25 mg total) by mouth 3 (three) times daily. 21 tablet 0  . FENOFIBRATE 160 MG PO TABS Oral Take 160 mg by mouth daily.    . INSULIN ISOPHANE HUMAN 100 UNIT/ML [place] SUSP Subcutaneous Inject 35-75 Units into the skin 3 (three) times daily with meals. 60 units every morning, 35 units with lunch, 75 units with dinner.    . INSULIN REGULAR HUMAN (CONC) 500 UNIT/ML [place] SOLN Subcutaneous Inject 25 Units  into the skin 3 (three) times daily with meals.    Marland Kitchen METFORMIN HCL 1000 MG PO TABS Oral Take 1,000 mg by mouth 2 (two) times daily with a meal.    . NAPROXEN SODIUM 220 MG PO TABS Oral Take 220 mg by mouth daily as needed. Pain.    Marland Kitchen ONDANSETRON 4 MG PO TBDP Oral Take 1 tablet (4 mg total) by mouth every 8 (eight) hours as needed for nausea. 20 tablet 0  . RAMIPRIL 5 MG PO CAPS Oral Take 5 mg by mouth daily.    Marland Kitchen ROSUVASTATIN CALCIUM 20 MG PO TABS Oral Take 20 mg  by mouth daily.      BP 123/55  Pulse 131  Temp(Src) 99.7 F (37.6 C) (Oral)  Resp 16  SpO2 97%  Physical Exam  Nursing note and vitals reviewed. Constitutional: He is oriented to person, place, and time. He appears well-developed and well-nourished.       Pale middle-aged man, appears ill, has resting tachycardia of 130.    HENT:  Head: Normocephalic and atraumatic.  Right Ear: External ear normal.  Left Ear: External ear normal.  Mouth/Throat: Oropharynx is clear and moist.  Eyes: Conjunctivae and EOM are normal. Pupils are equal, round, and reactive to light. No scleral icterus.  Neck: Normal range of motion. Neck supple.  Cardiovascular: Regular rhythm and normal heart sounds.        Tachycardia of 130.    Pulmonary/Chest: Effort normal and breath sounds normal.  Abdominal: Soft. Bowel sounds are normal.  Musculoskeletal: Normal range of motion. He exhibits no edema and no tenderness.  Neurological: He is alert and oriented to person, place, and time.       No sensory or motor deficit.  Skin: Skin is warm and dry. There is pallor.  Psychiatric: He has a normal mood and affect. His behavior is normal.    ED Course  CRITICAL CARE Performed by: Osvaldo Human Authorized by: Osvaldo Human Total critical care time: 60 minutes Critical care was necessary to treat or prevent imminent or life-threatening deterioration of the following conditions: metabolic crisis and dehydration. Critical care was time spent personally by me on the following activities: discussions with consultants, development of treatment plan with patient or surrogate, evaluation of patient's response to treatment, examination of patient, obtaining history from patient or surrogate, ordering and review of laboratory studies, ordering and performing treatments and interventions, ordering and review of radiographic studies and re-evaluation of patient's condition.   (including critical care time)  Labs  Reviewed  GLUCOSE, CAPILLARY - Abnormal; Notable for the following:    Glucose-Capillary 520 (*)    All other components within normal limits  CBC  DIFFERENTIAL  COMPREHENSIVE METABOLIC PANEL  LIPASE, BLOOD  BLOOD GAS, ARTERIAL   US Abdomen Complete  06/15/2011  *RADIOLOGY REPORT*  Clinical Data:  Right upper quadrant pain, nausea  COMPLETE ABDOMINAL ULTRASOUND  Comparison:  09/18/2009  Findings:  Gallbladder:  No gallstones, gallbladder wall thickening, or pericholecystic fluid. No sonographic Murphy's sign  Common bile duct:  Measures 4 mm in diameter within normal limits.  Liver:  No focal lesion identified. Again noted diffuse increased echogenicity of the liver consistent with fatty infiltration.  IVC:  Not visualized due to abundant bowel gas.  Pancreas:  Not visualized due to abundant bowel gas  Spleen:  Measures 17.5 by cm in length.  The splenic volume measures 1418.7 ml  Right Kidney:  Measures 15.2 cm in length.  No  hydronephrosis or diagnostic renal calculus.  A cyst in mid pole measures 3.1 x 4.1 cm.  Left Kidney:  Measures 15.6 cm in length.  A small cyst mid to upper pole measures 1 x 1.1 cm.  Abdominal aorta:  Not visualized due to abundant bowel gas.  IMPRESSION:  1.  No gallstones are noted within gallbladder.  Normal CBD. 2.  Again noted fatty infiltration of the liver. 3.  Significant splenomegaly. 4.  Bilateral renal cyst.  No hydronephrosis or diagnostic renal calculus.  Original Report Authenticated By: Natasha Mead, M.D.    8:15 AM Pt seen --> physical exam performed.  Lab workup ordered.  IV fluids, IV insulin ordered.  Old charts reviewed.  8:31 AM  Date: 06/16/2011  Rate: 128  Rhythm: sinus tachycardia  QRS Axis: normal  Intervals: normal  ST/T Wave abnormalities: nonspecific ST/T changes  Conduction Disutrbances:none  Narrative Interpretation: Abnormal EKG.  Old EKG Reviewed: changes noted--rate more rapid on today's tracing.  Was 96 yesterday.    8:44 AM Hb 7.6  and HCT 24 today.  Stool negative for blood by Hemoccult as tested by me.  T&C x 2 units packed RBC's.    9:59 AM K was high at 5.8, repeat K ordered as it was 4.1 yesterday.  Repeat CBG was 417.  Repeat insulin 10 units IV.    10:55 AM Repeat potassium was 5.4.  Will give calcium chloride and sodium bicarbonate to treat.  Will get repet CBG and anemia panel prior to starting transfusion.   1. Hyperglycemia   2. Hyperkalemia   3. Anemia            Carleene Cooper III, MD 06/16/11 1130

## 2011-06-16 NOTE — H&P (Signed)
Hospital Admission Note Date: 06/16/2011  Patient name: Juan [name] Medical record number: [id number] Date of birth: Mar 25, 1965 Age: 46 y.o. Gender: male PCP: OWENS,REBECCA, FNP, FNP  Attending physician: Maryruth Bun Swan Zayed, MD Emergency Contact:  Mackie Pai (wife) Code Status:  Full code  Chief Complaint: "I couldn't keep my sugars under 500"  History of Present Illness: Juan [name] is an 46 y.o. male with PMH with DM-2 and chronic pancreatitis who presents to the hospital with a chief complaint of uncontrolled blood glucoses over the past 24 hours.  The patient states he has had pancreatitis symptoms x 1 week, abdominal pain but no nausea or vomiting. Usually treats his pancreatitis with a downgrade in his diet to clear liquids and that it usually resolves with conservative therapy. No associated diarrhea, melena or hematochezia.  No subjective fever or chills.  The patient states that his pancreatitis is caused by hyperlipidemia, but that he doesn't follow up with a GI specialist regularly.  States that he typically develops problems with a pancreatitis flare once per year, and that when he has these episodes, they are associated with anemia.  Past Medical History Past Medical History  Diagnosis Date  . Pancreatitis     Chronic, secondary to hyperlipidemia  . Non Hodgkin's lymphoma     Treated in 1991  . Diabetes mellitus   . Hyperlipidemia   . Bilateral renal cysts 06/16/2011  . Fatty liver disease, nonalcoholic 06/16/2011    Past Surgical History Past Surgical History  Procedure Date  . Nasal septum surgery   . Knee surgery   . Tonsillectomy   . Elbow surgery     Meds: Prior to Admission medications   Medication Sig Start Date End Date Taking? Authorizing Provider  chlorproMAZINE (THORAZINE) 25 MG tablet Take 1 tablet (25 mg total) by mouth 3 (three) times daily. 06/15/11 07/15/11 Yes Bethany J Hunt, PA  fenofibrate 160 MG tablet Take 160 mg by mouth daily.   Yes  Historical Provider, MD  insulin NPH (HUMULIN N,NOVOLIN N) 100 UNIT/ML injection Inject 35-75 Units into the skin 3 (three) times daily with meals. 60 units every morning, 35 units with lunch, 75 units with dinner.   Yes Historical Provider, MD  insulin regular human CONCENTRATED (HUMULIN R) 500 UNIT/ML SOLN injection Inject 25 Units into the skin 3 (three) times daily with meals.   Yes Historical Provider, MD  metFORMIN (GLUCOPHAGE) 1000 MG tablet Take 1,000 mg by mouth 2 (two) times daily with a meal.   Yes Historical Provider, MD  naproxen sodium (ANAPROX) 220 MG tablet Take 220 mg by mouth daily as needed. Pain.   Yes Historical Provider, MD  ondansetron (ZOFRAN ODT) 4 MG disintegrating tablet Take 1 tablet (4 mg total) by mouth every 8 (eight) hours as needed for nausea. 06/15/11 06/22/11 Yes Bethany J Hunt, PA  ramipril (ALTACE) 5 MG capsule Take 5 mg by mouth daily.   Yes Historical Provider, MD  rosuvastatin (CRESTOR) 20 MG tablet Take 20 mg by mouth daily.   Yes Historical Provider, MD    Allergies: Contrast media  Social History: History   Social History  . Marital Status: Married    Spouse Name: [name]    Number of Children: 1  . 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 Smoking status: Never Smoker   . Smokeless tobacco: Not on file  . Alcohol Use: No  .  Drug Use:   . Sexually Active:    Other Topics Concern  . Not on file   Social History Narrative   Adopted, so no pertinent FH.  Married.  Lives with wife in [place].  Independent of ADLs and ambulation.    Family History:  History reviewed. No pertinent family history.  Review of Systems: Constitutional: No subjective fever or chills;  Appetite normal, but only taking in clear liquids over the past week; No significant weight loss.  HEENT: No blurry vision or diplopia, no pharyngitis or dysphagia CV: No chest pain or arrhythmia.  Resp: No  SOB, no cough. GI: No N/V, no diarrhea, no melena or hematochezia. + Abdominal pain GU: No dysuria or hematuria.  MSK: no myalgias/arthralgias.  Neuro:  No headache or focal neurological deficits.  Psych: No depression or anxiety.  Endo: No thyroid disease, +type 2 DM.  Skin: No rashes or lesions.  Heme: + anemia with episodes of pancreatitis.  Physical Exam: Blood pressure 136/66, pulse 137, temperature 99.1 F (37.3 C), temperature source Oral, resp. rate 20, SpO2 93.00%. BP 136/66  Pulse 137  Temp(Src) 99.1 F (37.3 C) (Oral)  Resp 20  SpO2 93%  General Appearance:    Alert, cooperative, no distress, appears stated age  Head:    Normocephalic, without obvious abnormality, atraumatic  Eyes:    PERRL, conjunctiva/corneas clear, EOM's intact   Ears:    Normal TM's and external ear canals, both ears  Nose:   Nares normal, septum midline, mucosa normal, no drainage    or sinus tenderness  Throat:   Lips, mucosa, and tongue normal; teeth and gums normal  Neck:   Supple, symmetrical, trachea midline, no adenopathy;       thyroid:  No enlargement/tenderness/nodules; no carotid   bruit or JVD  Back:     Symmetric, no curvature, ROM normal, no CVA tenderness  Lungs:     Clear to auscultation bilaterally, respirations unlabored  Chest wall:    No tenderness or deformity  Heart:    Regular rate and rhythm, S1 and S2 normal, no murmur, rub   or gallop  Abdomen:     Soft, non-tender, bowel sounds active all four quadrants,    no masses, no organomegaly  Rectal:    Done by ED physician and not repeated.  Stools heme negative.   Extremities:   Extremities normal, atraumatic, no cyanosis or edema  Pulses:   2+ and symmetric all extremities  Skin:   Skin color, texture, turgor normal, no rashes or lesions  Lymph nodes:   Cervical, supraclavicular, and axillary nodes normal  Neurologic:   Non-focal   Lab results: Basic Metabolic Panel:  Lab 06/16/11 1610 06/16/11 0815 06/15/11 0848  NA -- 134*  140  K 5.4* 5.8* --  CL -- 105 95*  CO2 -- 21 32  GLUCOSE -- 468* 220*  BUN -- 68* 23  CREATININE -- 1.42* 1.00  CALCIUM -- 9.5 12.2*  MG -- -- --  PHOS -- -- --   GFR CrCl is unknown because there is no height on file for the current visit. Liver Function Tests:  Lab 06/16/11 0815 06/15/11 0848  AST 23 77*  ALT 42 83*  ALKPHOS 55 83  BILITOT 0.3 0.3  PROT 5.6* 8.7*  ALBUMIN 2.3* 3.6    Lab 06/16/11 0815 06/15/11 0848  LIPASE 200* 74*  AMYLASE -- --    CBC:  Lab 06/16/11 0815 06/15/11 0848  WBC 11.5* 9.4  NEUTROABS  8.7* 7.7  HGB 7.6* 12.7*  HCT 24.3* 37.9*  MCV 79.7 76.4*  PLT 198 174   CBG:  Lab 06/16/11 1114 06/16/11 0955 06/16/11 0723 06/15/11 0845  GLUCAP 368* 417* 520* 213*   Anemia work up  [employer] 06/16/11 1100  VITAMINB12 --  FOLATE --  FERRITIN --  TIBC --  IRON --  RETICCTPCT 2.5   Microbiology No results found for this or any previous visit (from the past 240 hour(s)).   Imaging results: IMPRESSION:  1.  No gallstones are noted within gallbladder.  Normal CBD. 2.  Again noted fatty infiltration of the liver. 3.  Significant splenomegaly. 4.  Bilateral renal cyst.  No hydronephrosis or diagnostic renal calculus.  Original Report Authenticated By: Natasha Mead, M.D.    Dg Chest Port 1 View 06/16/2011 IMPRESSION:  1.  Low volumes with new atelectasis or infiltrate at the left lung base.  Original Report Authenticated By: Osa Craver, M.D.    Assessment & Plan: Principal Problem:  *Chronic pancreatitis The patient's findings, on physical exam and laboratories reveal acute on chronic pancreatitis. The patient normally treat this conservatively with bowel rest and avoidance hospitalization. He denies any active nausea or vomiting and his abdominal pain is currently under reasonable control. Will admit him to the hospital, start him on IV fluids, and administer pain medications as needed. We'll check a fasting lipid panel in the  morning. Active Problems:  Hyperlipidemia Patient normally takes fenofibrate and Crestor. We'll check a fasting lipid panel in the morning to ensure that his lipids are under good control.  Hyperkalemia Likely from dehydration. Should respond to IV fluids. Hold ACE inhibitor. He was given calcium chloride for her protection by the emergency department physician.  Acute kidney injury Likely from dehydration. Hydrate and monitor his renal function for recovery.  Dehydration Likely from osmotic diuresis from uncontrolled blood glucoses. We'll hydrate vigorously and control his blood glucoses.  Uncontrolled type 2 DM with hyperosmolar nonketotic hyperglycemia The patient will be placed on aggressive insulin therapy and hydrated vigorously.  Microcytic anemia Patient reports that he has had recurrent episodes of anemia in the past related to his pancreatitis, and that he has never had to have a blood transfusion before. He was given a unit of blood by the emergency department physician. Would simply monitor his hemoglobin and hematocrit at this point, as he is no evidence of hemodynamic instability or a significant GI bleed given his heme negative stools. Followup anemia panel.  Fatty liver disease, nonalcoholic / Splenomegaly The patient has a known history of non-Hodgkin's lymphoma and has chronic splenomegaly on imaging.  LFTs not elevated.  Prophylaxis: PAS hoses  Time Spent On Admission: 1 hour.  Damia Bobrowski 06/16/2011, 12:22 PM Pager [phone]

## 2011-06-16 NOTE — ED Provider Notes (Signed)
Medical screening examination/treatment/procedure(s) were conducted as a shared visit with non-physician practitioner(s) and myself.  I personally evaluated the patient during the encounter   Date: 06/16/2011  Rate: 96  Rhythm: normal sinus rhythm  QRS Axis: normal  Intervals: QT prolonged  ST/T Wave abnormalities: nonspecific T wave changes  Conduction Disutrbances:none  Narrative Interpretation:   Old EKG Reviewed: changes noted no longer tachycardic   Min epigastric RUQ ttp. Labs remarkable for slightly elevated lipase. States he is feeling better. RUQ U/S unremarkable.    Forbes Cellar, MD 06/16/11 [phone]

## 2011-06-17 DIAGNOSIS — D696 Thrombocytopenia, unspecified: Secondary | ICD-10-CM | POA: Diagnosis present

## 2011-06-17 LAB — COMPREHENSIVE METABOLIC PANEL
ALT: 32 U/L (ref 0–53)
BUN: 38 mg/dL — ABNORMAL HIGH (ref 6–23)
CO2: 24 mEq/L (ref 19–32)
Calcium: 10.5 mg/dL (ref 8.4–10.5)
GFR calc Af Amer: >90 mL/min (ref >90)
GFR calc non Af Amer: 79 mL/min — ABNORMAL LOW (ref >90)
Glucose, Bld: 231 mg/dL — ABNORMAL HIGH (ref 70–99)
Sodium: 139 mEq/L (ref 135–145)

## 2011-06-17 LAB — CBC
Hemoglobin: 7.9 g/dL — ABNORMAL LOW (ref 13.0–17.0)
MCHC: 33.5 g/dL (ref 30.0–36.0)
RDW: 16.3 % — ABNORMAL HIGH (ref 11.5–15.5)
WBC: 8.2 10*3/uL (ref 4.0–10.5)

## 2011-06-17 LAB — LIPID PANEL
Cholesterol: 142 mg/dL (ref 0–200)
HDL: 10 mg/dL — ABNORMAL LOW (ref >39)
Triglycerides: 709 mg/dL — ABNORMAL HIGH (ref <150)

## 2011-06-17 LAB — GLUCOSE, CAPILLARY
Glucose-Capillary: 191 mg/dL — ABNORMAL HIGH (ref 70–99)
Glucose-Capillary: 237 mg/dL — ABNORMAL HIGH (ref 70–99)

## 2011-06-17 LAB — LIPASE, BLOOD: Lipase: 114 U/L — ABNORMAL HIGH (ref 11–59)

## 2011-06-17 LAB — TYPE AND SCREEN
ABO/RH(D): AB NEG
Unit division: 0

## 2011-06-17 MED ORDER — ROSUVASTATIN CALCIUM 20 MG PO TABS
20.0000 mg | ORAL_TABLET | Freq: Every day | ORAL | Status: DC
  Filled 2011-06-17 (×2): qty 1

## 2011-06-17 MED ORDER — INSULIN ASPART 100 UNIT/ML ~~LOC~~ SOLN
0.0000 [IU] | Freq: Three times a day (TID) | SUBCUTANEOUS | Status: DC
  Administered 2011-06-17: 11 [IU] via SUBCUTANEOUS

## 2011-06-17 MED ORDER — NON FORMULARY: 20.0000 mg | Freq: Every day | Status: DC

## 2011-06-17 MED ORDER — INSULIN ASPART 100 UNIT/ML ~~LOC~~ SOLN
6.0000 [IU] | Freq: Three times a day (TID) | SUBCUTANEOUS | Status: DC
  Administered 2011-06-17: 6 [IU] via SUBCUTANEOUS

## 2011-06-17 MED ORDER — INSULIN ASPART 100 UNIT/ML ~~LOC~~ SOLN: 0.0000 [IU] | Freq: Every day | SUBCUTANEOUS | Status: DC

## 2011-06-17 MED ORDER — SODIUM CHLORIDE 0.9 % IV SOLN
INTRAVENOUS | Status: DC
  Administered 2011-06-17: 125 mL/h via INTRAVENOUS

## 2011-06-17 NOTE — Progress Notes (Signed)
PROGRESS NOTE  Juan [name]:[id number] DOB: January 24, 1966 DOA: 06/16/2011 PCP: Juan Manges, FNP, FNP  Brief narrative: Juan [name] is an 46 y.o. male with PMH with DM-2 and chronic pancreatitis who presented to the hospital on 06/16/11 with a chief complaint of uncontrolled blood glucoses over the past 24 hours, and a recent flare of his chronic pancreatitis.    Assessment/Plan: Principal Problem:  *Chronic pancreatitis  The patient's findings, on physical exam and laboratories reveal acute on chronic pancreatitis. The patient normally treat this conservatively with bowel rest and avoidance of hospitalization. He continues to deny any active nausea or vomiting and has no abdominal pain this morning. Will advance diet to [place]. Active Problems:  Hyperlipidemia  Patient normally takes fenofibrate and Crestor, which we have continued. Fasting lipid panel pending. Hyperkalemia  Likely from dehydration. Responded to IV fluids and holding ACE inhibitor. He was given calcium chloride for heart protection by the emergency department physician.  Acute kidney injury  Likely from dehydration. Improved with IVF.  Dehydration  Likely from osmotic diuresis from uncontrolled blood glucoses. We'll hydrate vigorously and control his blood glucoses.  Uncontrolled type 2 DM with hyperosmolar nonketotic hyperglycemia  The patient was placed on aggressive insulin therapy and hydrated vigorously. CBGs 185-237 over past 24 hours.  On insulin resistant SSI Q 4 hours.  Will change to insulin resistant  Microcytic anemia  Patient reports that he has had recurrent episodes of anemia in the past related to his pancreatitis, and that he has never had to have a blood transfusion before. He was given 2 units of blood by the emergency department physician, with only a minimal rise in his hemoglobin.  He has heme negative stools. Anemia panel shows no deficiencies, and no elevation of bilirubin to suggest hemolysis but will  check haptoglobin and LDH.  His acute drop in hemoglobin may be explained by splenic sequestration, and hemoconcentration/dehydration in the setting of vigorous IVF re-hydration. Fatty liver disease, nonalcoholic / Splenomegaly / Thrombocytopenia  The patient has a known history of non-Hodgkin's lymphoma and has chronic splenomegaly on imaging, which likely is contributing to his anemia and his thrombocytopenia. LFTs not elevated. Spoke with Juan [name] who recommends close follow up for the splenomegaly, but agrees with the plan as outlined above.   Code Status: Full Family Communication: Juan [name] (wife) Disposition Plan: Home, when stable.  Consultants:  Telephone consult with Juan [name], Hematology/Oncology  Procedures:  12 Lead EKG 06/16/11: SINUS TACHYCARDIA ~ V-rate> 99; BORDERLINE ST DEPRESSION, DIFFUSE LEADS ~ ST <-0.2mV, ant/lat/inf; ABNORMAL T, CONSIDER ISCHEMIA, DIFFUSE LEADS ~ T <-0.56mV, ant/lat/inf; Compared to previous tracing Rate faster  Antibiotics:  None   Subjective  Juan [name] feels well.  No abdominal pain.  No nausea or vomiting.  No BMs.   Objective    Interim History: Stable overnight.   Objective: Filed Vitals:   06/16/11 1543 06/16/11 1647 06/16/11 2153 06/17/11 0555  BP: 144/75 141/75 143/78 124/81  Pulse: 123 123 110 99  Temp: 99.6 F (37.6 C) 99.2 F (37.3 C) 98.7 F (37.1 C) 98.5 F (36.9 C)  TempSrc: Oral Oral Oral Oral  Resp: 20 18 18 18   Height:  5\' 11"  (1.803 m)    Weight:  102.2 kg (225 lb 5 oz)    SpO2: 94% 94% 96% 99%    Intake/Output Summary (Last 24 hours) at 06/17/11 0814 Last data filed at 06/17/11 0555  Gross per 24 hour  Intake   2655 ml  Output  5550 ml  Net  -2895 ml    Exam: Gen:  NAD Cardiovascular:  RRR, No M/R/G Respiratory: Lungs CTAB Gastrointestinal: Abdomen soft, NT/ND with normal active bowel sounds. Extremities: No C/E/C  Data Reviewed: Basic Metabolic Panel:  Lab 06/17/11 1610 06/16/11  0955 06/16/11 0815 06/15/11 0848  NA 139 -- 134* 140  K 4.3 5.4* -- --  CL 110 -- 105 95*  CO2 24 -- 21 32  GLUCOSE 231* -- 468* 220*  BUN 38* -- 68* 23  CREATININE 1.11 -- 1.42* 1.00  CALCIUM 10.5 -- 9.5 12.2*  MG -- -- -- --  PHOS -- -- -- --   GFR Estimated Creatinine Clearance: 103.4 ml/min (by C-G formula based on Cr of 1.11). Liver Function Tests:  Lab 06/17/11 0400 06/16/11 0815 06/15/11 0848  AST 21 23 77*  ALT 32 42 83*  ALKPHOS 46 55 83  BILITOT 0.3 0.3 0.3  PROT 5.7* 5.6* 8.7*  ALBUMIN 2.5* 2.3* 3.6    Lab 06/17/11 0400 06/16/11 0815 06/15/11 0848  LIPASE 114* 200* 74*  AMYLASE -- -- --    CBC:  Lab 06/17/11 0400 06/16/11 0815 06/15/11 0848  WBC 8.2 11.5* 9.4  NEUTROABS -- 8.7* 7.7  HGB 7.9* 7.6* 12.7*  HCT 23.6* 24.3* 37.9*  MCV 78.7 79.7 76.4*  PLT 108* 198 174   CBG:  Lab 06/17/11 0731 06/17/11 0438 06/17/11 0007 06/16/11 1956 06/16/11 1703  GLUCAP 230* 237* 191* 185* 234*   Hgb A1c  Basename 06/16/11 1100  HGBA1C 9.2*   Lipid Profile No results found for this basename: CHOL:2,HDL:2,LDLCALC:2,TRIG:2,CHOLHDL:2,LDLDIRECT:2 in the last 72 hours  Anemia work up  [facility] 06/16/11 1100  VITAMINB12 539  FOLATE 10.7  FERRITIN 321  TIBC 224  IRON 47  RETICCTPCT 2.5    Studies:  US Abdomen Complete 06/15/2011 IMPRESSION:  1.  No gallstones are noted within gallbladder.  Normal CBD. 2.  Again noted fatty infiltration of the liver. 3.  Significant splenomegaly. 4.  Bilateral renal cyst.  No hydronephrosis or diagnostic renal calculus.  Original Report Authenticated By: Juan [name], M.D.    Dg Chest Port 1 View 06/16/2011  IMPRESSION:  1.  Low volumes with new atelectasis or infiltrate at the left lung base.  Original Report Authenticated By: Juan [name], M.D.    Scheduled Meds:   . sodium chloride   Intravenous Once  . sodium chloride   Intravenous Once  . atorvastatin  40 mg Oral q1800  . calcium chloride 350 mg IVPB  350 mg  Intravenous Once  . fenofibrate  160 mg Oral Daily  . insulin aspart  0-20 Units Subcutaneous Q4H  . insulin aspart  10 Units Subcutaneous Once  . insulin glargine  50 Units Subcutaneous BID  . sodium bicarbonate  25 mEq Intravenous Once  . sodium chloride  1,000 mL Intravenous Once  . DISCONTD: sodium chloride   Intravenous STAT  . DISCONTD: insulin aspart  10 Units Subcutaneous Once  . DISCONTD: insulin regular  10 Units Subcutaneous Once   Continuous Infusions:   . sodium chloride 125 mL/hr (06/17/11 0807)      LOS: 1 day   Hillery Aldo, MD Pager [phone]  06/17/2011, 8:14 AM

## 2011-06-17 NOTE — Discharge Summary (Signed)
Physician Discharge Summary  Patient ID: Juan [name] MRN: [id number] DOB/AGE: [birth year] 45 y.o.  Admit date: 06/16/2011 Discharge date: 06/17/2011  Primary Care Physician:  [name], FNP, FNP   Discharge Diagnoses:    Present on Admission:  .Chronic pancreatitis .Hyperlipidemia .Hyperkalemia .Acute kidney injury .Dehydration .Uncontrolled type 2 DM with hyperosmolar nonketotic hyperglycemia .Microcytic anemia .Fatty liver disease, nonalcoholic .Splenomegaly .Thrombocytopenia  Discharge Medications:  Medication List  As of 06/17/2011  4:46 PM   TAKE these medications         chlorproMAZINE 25 MG tablet   Commonly known as: THORAZINE   Take 1 tablet (25 mg total) by mouth 3 (three) times daily.      fenofibrate 160 MG tablet   Take 160 mg by mouth daily.      HUMULIN R 500 UNIT/ML Soln injection   Generic drug: insulin regular human CONCENTRATED   Inject 25-40 Units into the skin 3 (three) times daily with meals. Verified that patient uses U-100 insulin syringe: ac breakfast "25 units" = 0.57ml = 125 units of U-500                                                                               Ac lunch "25 units" = 0.9ml = 125 units of U-500                                                                               Ac supper "40 units" = 0.40ml = 200 units of U-500      insulin NPH 100 UNIT/ML injection   Commonly known as: HUMULIN N,NOVOLIN N   Inject 35-75 Units into the skin 3 (three) times daily with meals. 60 units every morning, 35 units with lunch, 75 units with dinner.      metFORMIN 1000 MG tablet   Commonly known as: GLUCOPHAGE   Take 1,000 mg by mouth 2 (two) times daily with a meal.      naproxen sodium 220 MG tablet   Commonly known as: ANAPROX   Take 220 mg by mouth daily as needed. Pain.      ondansetron 4 MG disintegrating tablet   Commonly known as: ZOFRAN-ODT   Take 1 tablet (4 mg total) by mouth every 8 (eight) hours as needed for  nausea.      ramipril 5 MG capsule   Commonly known as: ALTACE   Take 5 mg by mouth daily.      rosuvastatin 20 MG tablet   Commonly known as: CRESTOR   Take 20 mg by mouth daily.             Disposition and Follow-up: The patient is being discharged home.  He is instructed to follow up with his PCP in one week.   Medical Consults:  Telephone consultation made with Dr. Eli Hose, Hematology/Oncology    Procedures and Diagnostic Studies:    US  Abdomen Complete 06/15/2011 IMPRESSION:  1.  No gallstones are noted within gallbladder.  Normal CBD. 2.  Again noted fatty infiltration of the liver. 3.  Significant splenomegaly. 4.  Bilateral renal cyst.  No hydronephrosis or diagnostic renal calculus.  Original Report Authenticated By: Natasha Mead, M.D.    Dg Chest Port 1 View 06/16/2011  IMPRESSION:  1.  Low volumes with new atelectasis or infiltrate at the left lung base.  Original Report Authenticated By: Osa Craver, M.D.    Discharge Laboratory Values: Basic Metabolic Panel:  Lab 06/17/11 1610 06/16/11 0955 06/16/11 0815 06/15/11 0848  NA 139 -- 134* 140  K 4.3 5.4* -- --  CL 110 -- 105 95*  CO2 24 -- 21 32  GLUCOSE 231* -- 468* 220*  BUN 38* -- 68* 23  CREATININE 1.11 -- 1.42* 1.00  CALCIUM 10.5 -- 9.5 12.2*  MG -- -- -- --  PHOS -- -- -- --   GFR Estimated Creatinine Clearance: 103.4 ml/min (by C-G formula based on Cr of 1.11). Liver Function Tests:  Lab 06/17/11 0400 06/16/11 0815 06/15/11 0848  AST 21 23 77*  ALT 32 42 83*  ALKPHOS 46 55 83  BILITOT 0.3 0.3 0.3  PROT 5.7* 5.6* 8.7*  ALBUMIN 2.5* 2.3* 3.6    Lab 06/17/11 0400 06/16/11 0815 06/15/11 0848  LIPASE 114* 200* 74*  AMYLASE -- -- --   CBC:  Lab 06/17/11 0400 06/16/11 0815 06/15/11 0848  WBC 8.2 11.5* 9.4  NEUTROABS -- 8.7* 7.7  HGB 7.9* 7.6* 12.7*  HCT 23.6* 24.3* 37.9*  MCV 78.7 79.7 76.4*  PLT 108* 198 174   CBG:  Lab 06/17/11 1158 06/17/11 0731 06/17/11 0438 06/17/11  0007 06/16/11 1956  GLUCAP 278* 230* 237* 191* 185*   Hgb A1c  Basename 06/16/11 1100  HGBA1C 9.2*   Lipid Profile  Basename 06/17/11 0400  CHOL 142  HDL 10*  LDLCALC UNABLE TO CALCULATE IF TRIGLYCERIDE OVER 400 mg/dL  TRIG 960*  CHOLHDL 45.4  LDLDIRECT --   Anemia work up  [employer] 06/16/11 1100  VITAMINB12 539  FOLATE 10.7  FERRITIN 321  TIBC 224  IRON 47  RETICCTPCT 2.5    Brief H and P: For complete details please refer to admission H and P, but in brief, Juan [name] is an 46 y.o. male with PMH with DM-2 and chronic pancreatitis who presented to the hospital on 06/16/11 with a chief complaint of uncontrolled blood glucoses over the past 24 hours, and a recent flare of his chronic pancreatitis.    Physical Exam at Discharge: BP 129/77  Pulse 99  Temp(Src) 98.5 F (36.9 C) (Oral)  Resp 20  Ht 5\' 11"  (1.803 m)  Wt 102.2 kg (225 lb 5 oz)  BMI 31.42 kg/m2  SpO2 99% Gen:  NAD Cardiovascular:  RRR, No M/R/G Respiratory: Lungs CTAB Gastrointestinal: Abdomen soft, NT/ND with normal active bowel sounds. Extremities: No C/E/C  Hospital Course:  Principal Problem:  *Chronic pancreatitis  The patient's findings, on physical exam and laboratories revealed acute on chronic pancreatitis. The patient normally treat this conservatively with bowel rest and avoidance of hospitalization. He continues to deny any active nausea or vomiting and has no abdominal pain this morning. He was able to tolerate advancement of his diet to [facility] without any return of symptoms, and was instructed to slowly advance diet.  Active Problems:  Hyperlipidemia  Patient normally takes fenofibrate and Crestor, which we have continued. Fasting lipid panel shows  hypertriglyceridemia.  He was advised to slowly advance diet to low fat. Hyperkalemia  Likely from dehydration. Responded to IV fluids and holding ACE inhibitor. He was given calcium chloride for heart protection by the emergency department  physician. His potassium was normal at the time of discharge. Acute kidney injury  Likely from dehydration. Improved with IVF.  Dehydration  Likely from osmotic diuresis from uncontrolled blood glucoses. We hydrated him vigorously and achieved better control of his blood glucoses.  Uncontrolled type 2 DM with hyperosmolar nonketotic hyperglycemia  The patient was placed on aggressive insulin therapy and hydrated vigorously.  His glycemic control was monitored closely with adjustments to his insulin made for better control.  His hemoglobin A1c indicates sub optimal outpatient control.  Recommend close follow up with his PCP for adjustment of his home regimen. Microcytic anemia  Patient reports that he has had recurrent episodes of anemia in the past related to his pancreatitis, and that he has never had to have a blood transfusion before. He was given 2 units of blood by the emergency department physician, with only a minimal rise in his hemoglobin. He has heme negative stools. Anemia panel shows no deficiencies, and no elevation of bilirubin or LDH and no reduction in haptoglobin to suggest hemolysis. His acute drop in hemoglobin may be explained by splenic sequestration, and hemoconcentration/dehydration in the setting of vigorous IVF re-hydration.  Fatty liver disease, nonalcoholic / Splenomegaly / Thrombocytopenia  The patient has a known history of non-Hodgkin's lymphoma and has chronic splenomegaly on imaging, which likely is contributing to his anemia and his thrombocytopenia. LFTs not elevated. Spoke with Dr. Clelia Croft who recommends close follow up for the splenomegaly, but agrees with the plan as outlined above.   Recommendations for hospital follow-up: 1.  Follow up with heme/onc for routine surveillance/evaluation of splenomegaly. 2.  Follow up with PCP for adjustment of insulin for better outpatient glycemic control. 3.  Follow up with PCP for ongoing hypertriglyceridemia. 4.  Repeat BMET in  1 week to ensure K+ not elevated on ACE-I therapy.  Diet:  Low sodium, heart healthy, carbohydrate modified.  Activity:  Increase activity slowly.  Condition at Discharge:   Improved.  Time spent on Discharge:  35 minutes.  Signed: Dr. Trula Ore Bindi Klomp Pager [phone] 06/17/2011, 4:46 PM

## 2011-06-18 NOTE — Progress Notes (Signed)
UR complete 

## 2011-06-21 ENCOUNTER — Telehealth: Payer: Self-pay | Admitting: *Deleted

## 2011-06-21 NOTE — Telephone Encounter (Signed)
New Patient Intake---Rec'd referral for patient to re-establish with Dr Pierce Crane for routine surveillance/eval of splenomegaly. Same problem that patient had when last seen here 2 years ago by Dr Donnie Coffin. Will order follow up in next few weeks.

## 2011-06-22 ENCOUNTER — Telehealth: Payer: Self-pay | Admitting: *Deleted

## 2011-06-22 NOTE — Telephone Encounter (Signed)
LEFT MESSAGE TO INFORM THE PATIENT OF THE NEW DATE AND TIME ON 07-20-2011 AT 2:00 FC 2:30 LAB 3:00 RUBIN

## 2011-07-10 ENCOUNTER — Telehealth: Payer: Self-pay | Admitting: Oncology

## 2011-07-10 NOTE — Telephone Encounter (Signed)
Dx- Splenomegaly

## 2011-07-16 ENCOUNTER — Telehealth: Payer: Self-pay | Admitting: *Deleted

## 2011-07-16 NOTE — Telephone Encounter (Signed)
moved patient to 08-02-2011 starting at 2:45pm patient confirmed over the phone on 07-16-2011

## 2011-07-20 ENCOUNTER — Other Ambulatory Visit: Payer: Managed Care, Other (non HMO) | Admitting: Lab

## 2011-07-20 ENCOUNTER — Ambulatory Visit: Payer: Managed Care, Other (non HMO) | Admitting: Oncology

## 2011-08-01 ENCOUNTER — Other Ambulatory Visit: Payer: Self-pay | Admitting: *Deleted

## 2011-08-01 DIAGNOSIS — E785 Hyperlipidemia, unspecified: Secondary | ICD-10-CM

## 2011-08-01 DIAGNOSIS — E875 Hyperkalemia: Secondary | ICD-10-CM

## 2011-08-01 DIAGNOSIS — K861 Other chronic pancreatitis: Secondary | ICD-10-CM

## 2011-08-02 ENCOUNTER — Ambulatory Visit: Payer: Managed Care, Other (non HMO) | Admitting: Physician Assistant

## 2011-08-02 ENCOUNTER — Other Ambulatory Visit (HOSPITAL_BASED_OUTPATIENT_CLINIC_OR_DEPARTMENT_OTHER): Payer: Managed Care, Other (non HMO) | Admitting: Lab

## 2011-08-02 VITALS — BP 151/85 | HR 102 | Temp 98.6°F | Ht 71.0 in | Wt 222.9 lb

## 2011-08-02 DIAGNOSIS — K861 Other chronic pancreatitis: Secondary | ICD-10-CM

## 2011-08-02 DIAGNOSIS — E785 Hyperlipidemia, unspecified: Secondary | ICD-10-CM

## 2011-08-02 DIAGNOSIS — R161 Splenomegaly, not elsewhere classified: Secondary | ICD-10-CM

## 2011-08-02 DIAGNOSIS — E875 Hyperkalemia: Secondary | ICD-10-CM

## 2011-08-02 LAB — LACTATE DEHYDROGENASE: LDH: 141 U/L (ref 94–250)

## 2011-08-02 LAB — COMPREHENSIVE METABOLIC PANEL
AST: 38 U/L — ABNORMAL HIGH (ref 0–37)
Albumin: 4.2 g/dL (ref 3.5–5.2)
BUN: 21 mg/dL (ref 6–23)
CO2: 22 mEq/L (ref 19–32)
Calcium: 10.1 mg/dL (ref 8.4–10.5)
Chloride: 99 mEq/L (ref 96–112)
Glucose, Bld: 415 mg/dL — ABNORMAL HIGH (ref 70–99)
Potassium: 4.2 mEq/L (ref 3.5–5.3)

## 2011-08-02 LAB — CBC & DIFF AND RETIC
BASO%: 1.4 % (ref 0.0–2.0)
EOS%: 3.6 % (ref 0.0–7.0)
HCT: 33.6 % — ABNORMAL LOW (ref 38.4–49.9)
MCH: 27 pg — ABNORMAL LOW (ref 27.2–33.4)
MCHC: 35.6 g/dL (ref 32.0–36.0)
NEUT%: 64.6 % (ref 39.0–75.0)
RBC: 4.42 10*6/uL (ref 4.20–5.82)
Retic Ct Abs: 76.47 10*3/uL (ref 34.80–93.90)
lymph#: 0.5 10*3/uL — ABNORMAL LOW (ref 0.9–3.3)

## 2011-08-02 LAB — FERRITIN: Ferritin: 32 ng/mL (ref 22–322)

## 2011-08-02 LAB — AMYLASE: Amylase: 27 U/L (ref 0–105)

## 2011-08-02 NOTE — Progress Notes (Signed)
Patient had labs drawn in anticipation of follow-up exam with Dr. Donnie Coffin on 08/06/11.-CTS

## 2011-08-03 ENCOUNTER — Telehealth: Payer: Self-pay | Admitting: *Deleted

## 2011-08-03 NOTE — Telephone Encounter (Signed)
left voice message to inform the patient of the new date and time on 08-06-2011 4:00pm

## 2011-08-06 ENCOUNTER — Telehealth: Payer: Self-pay | Admitting: *Deleted

## 2011-08-06 ENCOUNTER — Ambulatory Visit: Payer: Managed Care, Other (non HMO) | Admitting: Oncology

## 2011-08-06 NOTE — Telephone Encounter (Signed)
LEFT MESSAGE AT HOME NUMBER AND AT THE WORK NUMBER FOR A RETURN CALL REGARDING APPT TODAY

## 2011-09-17 DIAGNOSIS — S83289A Other tear of lateral meniscus, current injury, unspecified knee, initial encounter: Secondary | ICD-10-CM

## 2011-09-17 DIAGNOSIS — M2340 Loose body in knee, unspecified knee: Secondary | ICD-10-CM

## 2011-09-17 HISTORY — DX: Loose body in knee, unspecified knee: M23.40

## 2011-09-17 HISTORY — DX: Other tear of lateral meniscus, current injury, unspecified knee, initial encounter: S83.289A

## 2011-09-21 ENCOUNTER — Other Ambulatory Visit: Payer: Self-pay | Admitting: Orthopedic Surgery

## 2011-10-02 ENCOUNTER — Encounter (HOSPITAL_BASED_OUTPATIENT_CLINIC_OR_DEPARTMENT_OTHER): Payer: Self-pay | Admitting: *Deleted

## 2011-10-02 DIAGNOSIS — J3489 Other specified disorders of nose and nasal sinuses: Secondary | ICD-10-CM

## 2011-10-02 HISTORY — DX: Other specified disorders of nose and nasal sinuses: J34.89

## 2011-10-02 NOTE — Pre-Procedure Instructions (Addendum)
To come for BMET and CBC

## 2011-10-02 NOTE — Consult Note (Signed)
Hx. discussed with Dr. Sampson Goon re: splenomegaly/anemia, pancreatitis - need CBC prior to surgery.

## 2011-10-05 ENCOUNTER — Encounter (HOSPITAL_BASED_OUTPATIENT_CLINIC_OR_DEPARTMENT_OTHER)
Admission: RE | Admit: 2011-10-05 | Discharge: 2011-10-05 | Disposition: A | Discharge status: Home / Self Care | Payer: Managed Care, Other (non HMO) | Source: Ambulatory Visit | Attending: Orthopedic Surgery | Admitting: Orthopedic Surgery

## 2011-10-05 LAB — BASIC METABOLIC PANEL
BUN: 16 mg/dL (ref 6–23)
Chloride: 99 mEq/L (ref 96–112)
Creatinine, Ser: 0.66 mg/dL (ref 0.50–1.35)
GFR calc Af Amer: >90 mL/min (ref >90)
GFR calc non Af Amer: >90 mL/min (ref >90)

## 2011-10-05 LAB — CBC
HCT: 35.7 % — ABNORMAL LOW (ref 39.0–52.0)
MCHC: 36.7 g/dL — ABNORMAL HIGH (ref 30.0–36.0)
RDW: 17.1 % — ABNORMAL HIGH (ref 11.5–15.5)

## 2011-10-09 ENCOUNTER — Encounter (HOSPITAL_BASED_OUTPATIENT_CLINIC_OR_DEPARTMENT_OTHER): Payer: Self-pay | Admitting: Anesthesiology

## 2011-10-09 ENCOUNTER — Ambulatory Visit (HOSPITAL_BASED_OUTPATIENT_CLINIC_OR_DEPARTMENT_OTHER): Payer: Managed Care, Other (non HMO) | Admitting: Anesthesiology

## 2011-10-09 ENCOUNTER — Encounter (HOSPITAL_BASED_OUTPATIENT_CLINIC_OR_DEPARTMENT_OTHER)
Admission: RE | Disposition: A | Discharge status: Home / Self Care | Payer: Self-pay | Source: Ambulatory Visit | Attending: Orthopedic Surgery

## 2011-10-09 ENCOUNTER — Encounter (HOSPITAL_BASED_OUTPATIENT_CLINIC_OR_DEPARTMENT_OTHER): Payer: Self-pay

## 2011-10-09 ENCOUNTER — Ambulatory Visit (HOSPITAL_BASED_OUTPATIENT_CLINIC_OR_DEPARTMENT_OTHER)
Admission: RE | Admit: 2011-10-09 | Discharge: 2011-10-09 | Disposition: A | Discharge status: Home / Self Care | Payer: Managed Care, Other (non HMO) | Source: Ambulatory Visit | Attending: Orthopedic Surgery | Admitting: Orthopedic Surgery

## 2011-10-09 DIAGNOSIS — Z01812 Encounter for preprocedural laboratory examination: Secondary | ICD-10-CM | POA: Insufficient documentation

## 2011-10-09 DIAGNOSIS — M2240 Chondromalacia patellae, unspecified knee: Secondary | ICD-10-CM | POA: Insufficient documentation

## 2011-10-09 DIAGNOSIS — M23302 Other meniscus derangements, unspecified lateral meniscus, unspecified knee: Secondary | ICD-10-CM | POA: Insufficient documentation

## 2011-10-09 DIAGNOSIS — E119 Type 2 diabetes mellitus without complications: Secondary | ICD-10-CM | POA: Insufficient documentation

## 2011-10-09 DIAGNOSIS — Z794 Long term (current) use of insulin: Secondary | ICD-10-CM | POA: Insufficient documentation

## 2011-10-09 DIAGNOSIS — M2350 Chronic instability of knee, unspecified knee: Secondary | ICD-10-CM | POA: Insufficient documentation

## 2011-10-09 DIAGNOSIS — Z87898 Personal history of other specified conditions: Secondary | ICD-10-CM | POA: Insufficient documentation

## 2011-10-09 DIAGNOSIS — S83289A Other tear of lateral meniscus, current injury, unspecified knee, initial encounter: Secondary | ICD-10-CM

## 2011-10-09 HISTORY — DX: Loose body in knee, unspecified knee: M23.40

## 2011-10-09 HISTORY — DX: Chronic congestive splenomegaly: D73.2

## 2011-10-09 HISTORY — DX: Other specified disorders of nose and nasal sinuses: J34.89

## 2011-10-09 HISTORY — DX: Essential (primary) hypertension: I10

## 2011-10-09 HISTORY — PX: KNEE ARTHROSCOPY: SHX127

## 2011-10-09 HISTORY — DX: Other tear of lateral meniscus, current injury, unspecified knee, initial encounter: S83.289A

## 2011-10-09 LAB — GLUCOSE, CAPILLARY: Glucose-Capillary: 280 mg/dL — ABNORMAL HIGH (ref 70–99)

## 2011-10-09 SURGERY — ARTHROSCOPY, KNEE
Anesthesia: General | Site: Knee | Laterality: Left | Wound class: Clean

## 2011-10-09 MED ORDER — MIDAZOLAM HCL 2 MG/2ML IJ SOLN: 0.5000 mg | Freq: Once | INTRAMUSCULAR | Status: DC | PRN

## 2011-10-09 MED ORDER — POVIDONE-IODINE 7.5 % EX SOLN: Freq: Once | CUTANEOUS | Status: DC

## 2011-10-09 MED ORDER — MEPERIDINE HCL 25 MG/ML IJ SOLN: 6.2500 mg | INTRAMUSCULAR | Status: DC | PRN

## 2011-10-09 MED ORDER — ONDANSETRON HCL 4 MG/2ML IJ SOLN
INTRAMUSCULAR | Status: DC | PRN
  Administered 2011-10-09: 4 mg via INTRAVENOUS

## 2011-10-09 MED ORDER — LACTATED RINGERS IV SOLN
INTRAVENOUS | Status: DC
  Administered 2011-10-09 (×3): via INTRAVENOUS

## 2011-10-09 MED ORDER — HYDROMORPHONE HCL PF 1 MG/ML IJ SOLN
0.2500 mg | INTRAMUSCULAR | Status: DC | PRN
  Administered 2011-10-09 (×2): 0.5 mg via INTRAVENOUS

## 2011-10-09 MED ORDER — CEFAZOLIN SODIUM-DEXTROSE 2-3 GM-% IV SOLR
2.0000 g | INTRAVENOUS | Status: AC
  Administered 2011-10-09: 2 g via INTRAVENOUS

## 2011-10-09 MED ORDER — SODIUM CHLORIDE 0.9 % IR SOLN
Status: DC | PRN
  Administered 2011-10-09: 6000 mL

## 2011-10-09 MED ORDER — FENTANYL CITRATE 0.05 MG/ML IJ SOLN
INTRAMUSCULAR | Status: DC | PRN
  Administered 2011-10-09 (×2): 50 ug via INTRAVENOUS

## 2011-10-09 MED ORDER — PROPOFOL 10 MG/ML IV EMUL
INTRAVENOUS | Status: DC | PRN
  Administered 2011-10-09: 200 mg via INTRAVENOUS

## 2011-10-09 MED ORDER — PROMETHAZINE HCL 25 MG/ML IJ SOLN: 6.2500 mg | INTRAMUSCULAR | Status: DC | PRN

## 2011-10-09 MED ORDER — MIDAZOLAM HCL 5 MG/5ML IJ SOLN
INTRAMUSCULAR | Status: DC | PRN
  Administered 2011-10-09: 2 mg via INTRAVENOUS

## 2011-10-09 MED ORDER — OXYCODONE-ACETAMINOPHEN 5-325 MG PO TABS: 1.0000 | ORAL_TABLET | ORAL | Status: AC | PRN

## 2011-10-09 MED ORDER — BUPIVACAINE HCL (PF) 0.25 % IJ SOLN
INTRAMUSCULAR | Status: DC | PRN
  Administered 2011-10-09: 20 mL

## 2011-10-09 MED ORDER — LIDOCAINE HCL (CARDIAC) 20 MG/ML IV SOLN
INTRAVENOUS | Status: DC | PRN
  Administered 2011-10-09: 50 mg via INTRAVENOUS

## 2011-10-09 SURGICAL SUPPLY — 24 items
BANDAGE ELASTIC 6 VELCRO ST LF (GAUZE/BANDAGES/DRESSINGS) ×2 IMPLANT
BLADE 4.2CUDA (BLADE) ×2 IMPLANT
BLADE CUTTER GATOR 3.5 (BLADE) IMPLANT
BLADE CUTTER MENIS 5.5 (BLADE) IMPLANT
CANISTER OMNI JUG 16 LITER (MISCELLANEOUS) ×2 IMPLANT
CANISTER SUCTION 2500CC (MISCELLANEOUS) IMPLANT
CHLORAPREP W/TINT 26ML (MISCELLANEOUS) ×2 IMPLANT
CLOTH BEACON ORANGE TIMEOUT ST (SAFETY) ×2 IMPLANT
DRAPE ARTHROSCOPY W/POUCH 114 (DRAPES) ×2 IMPLANT
DRSG EMULSION OIL 3X3 NADH (GAUZE/BANDAGES/DRESSINGS) ×2 IMPLANT
GLOVE BIO SURGEON STRL SZ7.5 (GLOVE) ×2 IMPLANT
GLOVE BIOGEL PI IND STRL 8 (GLOVE) ×1 IMPLANT
GLOVE BIOGEL PI INDICATOR 8 (GLOVE) ×1
GOWN PREVENTION PLUS XLARGE (GOWN DISPOSABLE) ×2 IMPLANT
KNEE WRAP E Z 3 GEL PACK (MISCELLANEOUS) IMPLANT
PACK ARTHROSCOPY DSU (CUSTOM PROCEDURE TRAY) ×2 IMPLANT
PACK BASIN DAY SURGERY FS (CUSTOM PROCEDURE TRAY) ×2 IMPLANT
SPONGE GAUZE 4X4 12PLY (GAUZE/BANDAGES/DRESSINGS) ×2 IMPLANT
SUT ETHILON 4 0 PS 2 18 (SUTURE) ×2 IMPLANT
TOWEL OR 17X24 6PK STRL BLUE (TOWEL DISPOSABLE) ×2 IMPLANT
TOWEL OR NON WOVEN STRL DISP B (DISPOSABLE) ×2 IMPLANT
TUBING ARTHROSCOPY IRRIG 16FT (MISCELLANEOUS) ×2 IMPLANT
WAND STAR VAC 90 (SURGICAL WAND) ×2 IMPLANT
WATER STERILE IRR 1000ML POUR (IV SOLUTION) ×2 IMPLANT

## 2011-10-09 NOTE — Anesthesia Preprocedure Evaluation (Addendum)
Anesthesia Evaluation  Patient identified by MRN, date of birth, ID band Patient awake    Reviewed: Allergy & Precautions, H&P , NPO status , Patient's Chart, lab work & pertinent test results  History of Anesthesia Complications Negative for: history of anesthetic complications  Airway Mallampati: I TM Distance: >3 FB Neck ROM: Full    Dental No notable dental hx. (+) Teeth Intact and Dental Advisory Given   Pulmonary neg pulmonary ROS,  breath sounds clear to auscultation  Pulmonary exam normal       Cardiovascular hypertension, Pt. on medications Rhythm:Regular Rate:Normal  '09 ECHO: normal LVF, normal valves   Neuro/Psych negative neurological ROS     GI/Hepatic negative GI ROS, Neg liver ROS,   Endo/Other  Well Controlled, Type 1, Insulin Dependent and Oral Hypoglycemic Agents  Renal/GU negative Renal ROS     Musculoskeletal   Abdominal (+) + obese,   Peds  Hematology  (+) Blood dyscrasia (thrombocytopenia: plts 124k), ,   Anesthesia Other Findings Non-Hodgkin's lymphoma   Reproductive/Obstetrics                          Anesthesia Physical Anesthesia Plan  ASA: III  Anesthesia Plan: General   Post-op Pain Management:    Induction: Intravenous  Airway Management Planned: LMA  Additional Equipment:   Intra-op Plan:   Post-operative Plan:   Informed Consent: I have reviewed the patients History and Physical, chart, labs and discussed the procedure including the risks, benefits and alternatives for the proposed anesthesia with the patient or authorized representative who has indicated his/her understanding and acceptance.   Dental advisory given  Plan Discussed with: CRNA and Surgeon  Anesthesia Plan Comments: (Plan routine monitors, GA-LMA ok)        Anesthesia Quick Evaluation

## 2011-10-09 NOTE — Anesthesia Postprocedure Evaluation (Signed)
  Anesthesia Post-op Note  Patient: Juan [name]  Procedure(s) Performed: Procedure(s) (LRB): ARTHROSCOPY KNEE (Left)  Patient Location: PACU  Anesthesia Type: General  Level of Consciousness: awake, alert  and oriented  Airway and Oxygen Therapy: Patient Spontanous Breathing  Post-op Pain: none  Post-op Assessment: Post-op Vital signs reviewed, Patient's Cardiovascular Status Stable, Respiratory Function Stable, Patent Airway, No signs of Nausea or vomiting and Pain level controlled  Post-op Vital Signs: Reviewed and stable  Complications: No apparent anesthesia complications

## 2011-10-09 NOTE — H&P (Addendum)
Juan [name] is an 46 y.o. male.   Chief Complaint: L knee loose body and lateral meniscus tear HPI: L knee loose body and lateral meniscus tear after prior plateau fx.  Failed conservative management.  Past Medical History  Diagnosis Date  . Pancreatitis     occasional - last episode 06/2011  . Hyperlipidemia   . Fatty liver disease, nonalcoholic 06/16/2011  . Anemia     occasional - no problems currently(10/02/2011)  . Diabetes mellitus     IDDM  . Bilateral renal cysts 06/16/2011    states no known problems  . Non Hodgkin's lymphoma 1991  . Hypertension     states no dx. of HTN, takes med. to protect kidneys due to DM  . Splenomegaly, congestive, chronic   . Stuffy and runny nose 10/02/2011    yellow drainage from nose  . Loose body in knee 09/2011    loose bodies left knee  . Lateral meniscus tear 09/2011    left    Past Surgical History  Procedure Date  . Nasal septum surgery   . Tonsillectomy   . Elbow surgery   . Hernia repair     inguinal   . Tibia soft tissue biopsy x 3    left    Family History  Problem Relation Age of Onset  . Adopted: Yes   Social History:  reports that he has never smoked. He has never used smokeless tobacco. He reports that he does not drink alcohol or use illicit drugs.  Allergies:  Allergies  Allergen Reactions  . Gadolinium Derivatives Nausea Only    Medications Prior to Admission  Medication Sig Dispense Refill  . fenofibrate 160 MG tablet Take 160 mg by mouth daily. PM      . insulin NPH (HUMULIN N,NOVOLIN N) 100 UNIT/ML injection Inject into the skin 3 (three) times daily with meals. 60 units every morning, 40 units with lunch, 80 units with dinner.      . insulin regular human CONCENTRATED (HUMULIN R) 500 UNIT/ML SOLN injection Inject into the skin 3 (three) times daily with meals. 30 units AM, 40 units midday, 35 units PM Also sliding scale as needed      . meloxicam (MOBIC) 15 MG tablet Take 15 mg by mouth daily. PM      .  metFORMIN (GLUCOPHAGE) 1000 MG tablet Take 1,000 mg by mouth 2 (two) times daily with a meal.      . ramipril (ALTACE) 5 MG capsule Take 2.5 mg by mouth daily. PM      . rosuvastatin (CRESTOR) 20 MG tablet Take 20 mg by mouth daily. PM      . chlorproMAZINE (THORAZINE) 25 MG tablet Take 1 tablet (25 mg total) by mouth 3 (three) times daily.  21 tablet  0    Results for orders placed during the hospital encounter of 10/09/11 (from the past 48 hour(s))  GLUCOSE, CAPILLARY     Status: Abnormal   Collection Time   10/09/11 11:35 AM      Component Value Range Comment   Glucose-Capillary 290 (*) 70 - 99 mg/dL   POCT HEMOGLOBIN-HEMACUE     Status: Normal   Collection Time   10/09/11 11:37 AM      Component Value Range Comment   Hemoglobin 13.0  13.0 - 17.0 g/dL    No results found.  Review of Systems  All other systems reviewed and are negative.    Blood pressure 128/78, pulse 92, temperature 97.8  F (36.6 C), temperature source Oral, resp. rate 20, height 5\' 11"  (1.803 m), weight 99.791 kg (220 lb), SpO2 97.00%. Physical Exam  Constitutional: He is oriented to person, place, and time. He appears well-developed and well-nourished.  HENT:  Head: Atraumatic.  Eyes: EOM are normal.  Cardiovascular: Intact distal pulses.   Respiratory: Effort normal.  Musculoskeletal:       Left knee: He exhibits swelling. tenderness found.  Neurological: He is alert and oriented to person, place, and time.  Skin: Skin is warm and dry.  Psychiatric: He has a normal mood and affect.     Assessment/Plan  L knee loose body and lateral meniscus tear Plan L knee arthroscopic removal LB and partial meniscectomy. Risks / benefits of surgery discussed Consent on chart  NPO for OR Preop antibiotics   Fabiola Mudgett WILLIAM 10/09/2011, 1:31 PM

## 2011-10-09 NOTE — Anesthesia Procedure Notes (Signed)
Procedure Name: LMA Insertion Date/Time: 10/09/2011 1:46 PM Performed by: Gar Gibbon Pre-anesthesia Checklist: Patient identified, Emergency Drugs available, Suction available and Patient being monitored Patient Re-evaluated:Patient Re-evaluated prior to inductionOxygen Delivery Method: Circle System Utilized Preoxygenation: Pre-oxygenation with 100% oxygen Intubation Type: IV induction Ventilation: Mask ventilation without difficulty LMA: LMA inserted LMA Size: 4.0 Number of attempts: 1 Airway Equipment and Method: bite block Placement Confirmation: positive ETCO2 Tube secured with: Tape Dental Injury: Teeth and Oropharynx as per pre-operative assessment

## 2011-10-09 NOTE — Op Note (Signed)
Procedure(s): ARTHROSCOPY KNEE Procedure Note  Juan [name] male 46 y.o. 10/09/2011  Procedure(s) and Anesthesia Type:    * #1 Left knee arthroscopic partial lateral meniscectomy- General #2 left knee arthroscopic removal of multiple loose bodies #3 left knee arthroscopic abrasion arthroplasty trochlear chondromalacia #4 left knee debridement partial thickness anterior cruciate ligament tear  Postoperative diagnosis: #1 left knee posterior horn and anterior horn lateral meniscus tears #2 left knee multiple osteochondral loose bodies #3 left knee grade 3 trochlear chondromalacia #4 partial thickness anterior cruciate ligament tear  Surgeon(s) and Role:    * [name], MD - Primary     Surgeon: [name]   Assistants: Oren Beckmann PA-S.  Anesthesia: General endotracheal anesthesia      Procedure Detail  ARTHROSCOPY KNEE  Estimated Blood Loss: Min         Drains: none  Blood Given: none         Specimens: none        Complications:  * No complications entered in OR log *         Disposition: PACU - hemodynamically stable.         Condition: stable    Procedure:   INDICATIONS FOR SURGERY: The patient is 46 y.o. male who has had a history of left knee fracture in the past related to previous cancer which has not been treated. He has recent had more a mechanical symptoms in the knee with recurrent swelling in catching. He had an MRI which revealed an anterior horn lateral meniscus tear as well as multiple loose bodies. He was indicated for arthroscopic treatment to remove the loose bodies and take care of the meniscus tears as well.  OPERATIVE FINDINGS: Examination under anesthesia demonstrated no stiffness or instability. Diagnostic arthroscopy revealed a small area of grade 3 chondromalacia with flaps of cartilage which were detached from the subchondral bone. Medial and lateral gutters showed no loose bodies. The popliteus space was  examined and no loose bodies. He was noted to have partial-thickness tearing of the anterior cruciate ligament involving about 40% of fibers. This was debrided back and the remaining anterior cruciate ligament was intact. The PCL is intact. There were several small loose bodies noted in the intercondylar notch which were removed. There was one large bony fragment anterior to the anterior cruciate ligament which is loosely held in place a soft tissue. This was also resected. It measured about 18 x 10 mm. There was one remaining fragment which was attached to the anterior horn lateral meniscus I felt that removing this would destabilize the lateral meniscus. He was noted to have a stripe of grade 3 chondromalacia on the lateral femoral condyle. The posterior horn lateral meniscus had a small tear near the root involving only about one third the width of the meniscus. This was resected. Anteriorly the meniscus was degenerative and torn. This was debrided with the shaver back to healthy meniscus.  DESCRIPTION OF PROCEDURE: The patient was identified in preoperative  holding area where I personally marked the operative site after  verifying site, side, and procedure with the patient.   He was taken back to the operating room where general anesthesia was induced without complication. He was placed in the supine position and the operative leg was placed in a holder with the foot of the bed dropped. The nonoperative leg was well-padded. The left lower extremity was prepped and draped in the standard sterile fashion. The appropriate timeout procedure was carried out. A  standard anterolateral portal was established and the arthroscope was introduced into the joint. An anteromedial portal was then established above the anterior horn medial meniscus under direct visualization with needle localization. Diagnostic arthroscopy was then carried out with findings as described above. The biter and shaver were used in the  lateral compartment to perform partial meniscectomy of the posterior horn and subsequently the anterior horn lateral meniscus. The remaining meniscus was healthy-appearing. The notch was carefully examined and found to have partial tearing of the anterior cruciate ligament approximately 40% of fibers. These fibers were debrided using the shaver. In the anterolateral notch there was noted to be several small osteochondral loose bodies which were removed using a grasper. Anterior to the anterior cruciate ligament is noted to be a larger free bony fragment which is loosely connected to the surrounding soft tissues. The ArthroCare was used to free this up taking great care not to detach the insertion of the anterior horn medial or lateral meniscus. This bony fragment was then removed. It measured about 18 x 10 mm. Attention was then turned to the medial compartment where the meniscus was probed and found to be intact. Chondral surfaces were intact. The shaver was then used on the lateral femoral condyle first light debridement of the area of chondroplasty which was a stripe of grade 3 chondromalacia anterior to posterior. Attention was then turned to the cochlea where he had a small area of grade 3 chondromalacia measuring about 8 x 8 mm. The shaver was used to debride the flap edges back to stable cartilage and 2 debride the bony surface down to a light bleeding bone to promote healing.  The arthroscope was then removed from the joint the portals were closed using 3-0 nylon in an interrupted fashion. The joint and portal sites was insufflated with 20 cc of quarter percent Marcaine without epinephrine. Sterile dressings were then applied including Xeroform 4 x 4's ABDs and an Ace bandage. The patient was allowed to awaken from general anesthesia transferred to the stretcher and taken to the recovery room in stable condition.  POSTOPERATIVE PLAN: The patient will be discharged home today and will followup in one week  for suture removal and wound check.  He can begin gentle range of motion exercises when comfortable.

## 2011-10-09 NOTE — Transfer of Care (Signed)
Immediate Anesthesia Transfer of Care Note  Patient: Juan [name]  Procedure(s) Performed: Procedure(s) (LRB): ARTHROSCOPY KNEE (Left)  Patient Location: PACU  Anesthesia Type: General  Level of Consciousness: sedated  Airway & Oxygen Therapy: Patient Spontanous Breathing and Patient connected to face mask oxygen  Post-op Assessment: Report given to PACU RN and Post -op Vital signs reviewed and stable  Post vital signs: Reviewed and stable  Complications: No apparent anesthesia complications

## 2011-10-10 ENCOUNTER — Encounter (HOSPITAL_BASED_OUTPATIENT_CLINIC_OR_DEPARTMENT_OTHER): Payer: Self-pay | Admitting: Orthopedic Surgery

## 2011-10-12 ENCOUNTER — Encounter (HOSPITAL_BASED_OUTPATIENT_CLINIC_OR_DEPARTMENT_OTHER): Payer: Self-pay

## 2012-02-04 ENCOUNTER — Ambulatory Visit
Admission: RE | Admit: 2012-02-04 | Discharge: 2012-02-04 | Disposition: A | Discharge status: Home / Self Care | Payer: Managed Care, Other (non HMO) | Source: Ambulatory Visit | Attending: Family Medicine | Admitting: Family Medicine

## 2012-02-04 ENCOUNTER — Other Ambulatory Visit: Payer: Self-pay | Admitting: Family Medicine

## 2012-02-04 DIAGNOSIS — R109 Unspecified abdominal pain: Secondary | ICD-10-CM

## 2012-02-04 DIAGNOSIS — R0781 Pleurodynia: Secondary | ICD-10-CM

## 2012-02-04 MED ORDER — IOHEXOL 300 MG/ML  SOLN
125.0000 mL | Freq: Once | INTRAMUSCULAR | Status: AC | PRN
  Administered 2012-02-04: 125 mL via INTRAVENOUS

## 2012-02-04 MED ORDER — IOHEXOL 300 MG/ML  SOLN
30.0000 mL | Freq: Once | INTRAMUSCULAR | Status: AC | PRN
  Administered 2012-02-04: 30 mL via ORAL

## 2012-02-06 ENCOUNTER — Other Ambulatory Visit: Payer: Self-pay | Admitting: Family Medicine

## 2012-02-06 DIAGNOSIS — R109 Unspecified abdominal pain: Secondary | ICD-10-CM

## 2012-02-07 ENCOUNTER — Ambulatory Visit
Admission: RE | Admit: 2012-02-07 | Discharge: 2012-02-07 | Disposition: A | Discharge status: Home / Self Care | Payer: Managed Care, Other (non HMO) | Source: Ambulatory Visit | Attending: Family Medicine | Admitting: Family Medicine

## 2012-02-07 DIAGNOSIS — R109 Unspecified abdominal pain: Secondary | ICD-10-CM

## 2012-02-07 MED ORDER — GADOBENATE DIMEGLUMINE 529 MG/ML IV SOLN
20.0000 mL | Freq: Once | INTRAVENOUS | Status: AC | PRN
  Administered 2012-02-07: 20 mL via INTRAVENOUS

## 2012-02-26 ENCOUNTER — Telehealth (INDEPENDENT_AMBULATORY_CARE_PROVIDER_SITE_OTHER): Payer: Self-pay

## 2012-02-26 NOTE — Telephone Encounter (Signed)
Returned brandy's call in regards to having the pt seen earlier than his scheduled appt on 12/26.  I told Juan [name] that 12/26 is the first available MM has and she inquired about setting the pt up with a sooner appt with a different provider.  I told her that I would look into it and get back to her.  After reviewing the pt's chart I learned that MM is the only Dr in our practice that can take care of Juan [name] issues.  I then called Juan [name] and left a message for stating that MM is the only Dr here who can take care of this, and if she has any questions to call the office.

## 2012-02-27 ENCOUNTER — Telehealth (INDEPENDENT_AMBULATORY_CARE_PROVIDER_SITE_OTHER): Payer: Self-pay | Admitting: General Surgery

## 2012-02-27 NOTE — Telephone Encounter (Signed)
Called Brandy with [facility] to let her know that [name] had a cancellation for this coming Fri 12/13 @ 130p and that I could put Mr. Stfort in this spot.  She was happy with this and told me that she would call the pt with his appt information.....msc

## 2012-02-29 ENCOUNTER — Ambulatory Visit (INDEPENDENT_AMBULATORY_CARE_PROVIDER_SITE_OTHER): Payer: Managed Care, Other (non HMO) | Admitting: Surgery

## 2012-02-29 ENCOUNTER — Encounter (INDEPENDENT_AMBULATORY_CARE_PROVIDER_SITE_OTHER): Payer: Self-pay | Admitting: Surgery

## 2012-02-29 VITALS — BP 152/86 | HR 104 | Temp 97.3°F | Resp 16 | Ht 71.5 in | Wt 233.4 lb

## 2012-02-29 DIAGNOSIS — K760 Fatty (change of) liver, not elsewhere classified: Secondary | ICD-10-CM

## 2012-02-29 DIAGNOSIS — K861 Other chronic pancreatitis: Secondary | ICD-10-CM

## 2012-02-29 DIAGNOSIS — K7689 Other specified diseases of liver: Secondary | ICD-10-CM

## 2012-02-29 NOTE — Progress Notes (Signed)
Chief Complaint:  Left-sided abdominal pain, splenomegaly, hypertriglyceridemia associated chronic pancreatitis, distal pancreatic cyst, fatty infiltration of the liver-suspect portal hypertension  History of Present Illness:  Juan [name] is an 46 y.o. male with a significant phenotypic hypercholesterolemia, hypertriglyceridemia with subcutaneous fatty deposits and recurrent pancreatitis. He's been having left upper quadrant pain and underwent a CT scan and MRI which show fatty infiltration of his liver, some evidence of left upper quadrant varices with splenomegaly, a 2-1/2 cm cyst in the tail of the pancreas which may be a pseudocyst or could be a cystic neoplasm. I brought him in and showed him the CT and MRI. In addition he has a upper abdominal ventral eventration. He was hoping that could be repaired surgically.  I think the issues at hand or whether his risk of having surgery outweighs the benefit. I want to induce more reading on this particularly whether these patients get Elita Boone and could have portal hypertension. Portal hypertension to significantly increase his risk of having any elective retroperitoneal resection. I will bring him back in 2 weeks to discuss these risk and plan morbid day.  Past Medical History  Diagnosis Date  . Pancreatitis     occasional - last episode 06/2011  . Hyperlipidemia   . Fatty liver disease, nonalcoholic 06/16/2011  . Anemia     occasional - no problems currently(10/02/2011)  . Diabetes mellitus     IDDM  . Bilateral renal cysts 06/16/2011    states no known problems  . Non Hodgkin's lymphoma 1991  . Hypertension     states no dx. of HTN, takes med. to protect kidneys due to DM  . Splenomegaly, congestive, chronic   . Stuffy and runny nose 10/02/2011    yellow drainage from nose  . Loose body in knee 09/2011    loose bodies left knee  . Lateral meniscus tear 09/2011    left  . Blood transfusion without reported diagnosis     Past Surgical History   Procedure Date  . Nasal septum surgery   . Tonsillectomy   . Elbow surgery   . Hernia repair     inguinal   . Tibia soft tissue biopsy x 3    left  . Knee arthroscopy 10/09/2011    Procedure: ARTHROSCOPY KNEE;  Surgeon: [name], MD;  Location: [facility];  Service: Orthopedics;  Laterality: Left;    Current Outpatient Prescriptions  Medication Sig Dispense Refill  . fenofibrate 160 MG tablet Take 160 mg by mouth daily. PM      . insulin NPH (HUMULIN N,NOVOLIN N) 100 UNIT/ML injection Inject into the skin 3 (three) times daily with meals. 60 units every morning, 40 units with lunch, 80 units with dinner.      . insulin regular human CONCENTRATED (HUMULIN R) 500 UNIT/ML SOLN injection Inject into the skin 3 (three) times daily with meals. 30 units AM, 40 units midday, 35 units PM Also sliding scale as needed      . Insulin Syringe-Needle U-100 (INSULIN SYRINGE 1CC/31GX5/16") 31G X 5/16" 1 ML MISC       . meloxicam (MOBIC) 15 MG tablet Take 15 mg by mouth daily. PM      . metFORMIN (GLUCOPHAGE) 1000 MG tablet Take 1,000 mg by mouth 2 (two) times daily with a meal.      . ONE TOUCH ULTRA TEST test strip       . ramipril (ALTACE) 5 MG capsule Take 2.5 mg by mouth daily.  PM      . rosuvastatin (CRESTOR) 20 MG tablet Take 20 mg by mouth daily. PM      . traMADol (ULTRAM-ER) 300 MG 24 hr tablet        Review of patient's allergies indicates no active allergies. Family History  Problem Relation Age of Onset  . Adopted: Yes  . Family history unknown: Yes   Social History:   reports that he has never smoked. He has never used smokeless tobacco. He reports that he does not drink alcohol or use illicit drugs.   REVIEW OF SYSTEMS - PERTINENT POSITIVES ONLY: numerous  Physical Exam:   Blood pressure 152/86, pulse 104, temperature 97.3 F (36.3 C), temperature source Temporal, resp. rate 16, height 5' 11.5" (1.816 m), weight 233 lb 6.4 oz (105.87 kg). Body  mass index is 32.10 kg/(m^2).  Gen:  WDWN white male NAD  Neurological: Alert and oriented to person, place, and time. Motor and sensory function is grossly intact  Head: Normocephalic and atraumatic.  Eyes: Conjunctivae are normal. Pupils are equal, round, and reactive to light. No scleral icterus.  Neck: Normal [name] of motion. Neck supple. No tracheal deviation or thyromegaly present.  Cardiovascular:  SR without murmurs or gallops.  No carotid bruits Respiratory: Effort normal.  No respiratory distress. No chest wall tenderness. Breath sounds normal.  No wheezes, rales or rhonchi.  Abdomen:  Obese with stria GU: Musculoskeletal: Normal [name] of motion. Extremities are nontender. No cyanosis, edema or clubbing noted Lymphadenopathy: No cervical, preauricular, postauricular or axillary adenopathy is present Skin: Skin is warm and dry. No rash noted. No diaphoresis. No erythema. No pallor. Pscyh: Normal mood and affect. Behavior is normal. Judgment and thought content normal.   LABORATORY RESULTS: No results found for this or any previous visit (from the past 48 hour(s)).  RADIOLOGY RESULTS: No results found.  Problem List: Patient Active Problem List  Diagnosis  . Chronic pancreatitis  . Hyperlipidemia  . Hyperkalemia  . Acute kidney injury  . Dehydration  . Uncontrolled type 2 DM with hyperosmolar nonketotic hyperglycemia  . Microcytic anemia  . Fatty liver disease, nonalcoholic  . Bilateral renal cysts  . Splenomegaly  . Thrombocytopenia    Assessment & Plan: This adopted male with significant metabolic syndrome some related to a significant inborn air of metabolism has chronic recurrent pancreatitis and the splenomegaly. There is a small cyst in the tail of his pancreas at may be a pseudocyst or it could represent a cystic neoplasm. There is evidence of portal hypertension on his studies as well. I plan to investigate these risk factors and discussed them with him. I would  like to present in the GI conference.    Matt B. [name], MD, [facility] [phone] beeper [phone]  02/29/2012 2:11 PM

## 2012-02-29 NOTE — Patient Instructions (Signed)
Hypertriglyceridemia  Diet for High blood levels of Triglycerides Most fats in food are triglycerides. Triglycerides in your blood are stored as fat in your body. High levels of triglycerides in your blood may put you at a greater risk for heart disease and stroke.  Normal triglyceride levels are less than 150 mg/dL. Borderline high levels are 150-199 mg/dl. High levels are 200 - 499 mg/dL, and very high triglyceride levels are greater than 500 mg/dL. The decision to treat high triglycerides is generally based on the level. For people with borderline or high triglyceride levels, treatment includes weight loss and exercise. Drugs are recommended for people with very high triglyceride levels. Many people who need treatment for high triglyceride levels have metabolic syndrome. This syndrome is a collection of disorders that often include: insulin resistance, high blood pressure, blood clotting problems, high cholesterol and triglycerides. TESTING PROCEDURE FOR TRIGLYCERIDES  You should not eat 4 hours before getting your triglycerides measured. The normal range of triglycerides is between 10 and 250 milligrams per deciliter (mg/dl). Some people may have extreme levels (1000 or above), but your triglyceride level may be too high if it is above 150 mg/dl, depending on what other risk factors you have for heart disease.  People with high blood triglycerides may also have high blood cholesterol levels. If you have high blood cholesterol as well as high blood triglycerides, your risk for heart disease is probably greater than if you only had high triglycerides. High blood cholesterol is one of the main risk factors for heart disease. CHANGING YOUR DIET  Your weight can affect your blood triglyceride level. If you are more than 20% above your ideal body weight, you may be able to lower your blood triglycerides by losing weight. Eating less and exercising regularly is the best way to combat this. Fat provides more  calories than any other food. The best way to lose weight is to eat less fat. Only 30% of your total calories should come from fat. Less than 7% of your diet should come from saturated fat. A diet low in fat and saturated fat is the same as a diet to decrease blood cholesterol. By eating a diet lower in fat, you may lose weight, lower your blood cholesterol, and lower your blood triglyceride level.  Eating a diet low in fat, especially saturated fat, may also help you lower your blood triglyceride level. Ask your dietitian to help you figure how much fat you can eat based on the number of calories your caregiver has prescribed for you.  Exercise, in addition to helping with weight loss may also help lower triglyceride levels.   Alcohol can increase blood triglycerides. You may need to stop drinking alcoholic beverages.  Too much carbohydrate in your diet may also increase your blood triglycerides. Some complex carbohydrates are necessary in your diet. These may include bread, rice, potatoes, other starchy vegetables and cereals.  Reduce "simple" carbohydrates. These may include pure sugars, candy, honey, and jelly without losing other nutrients. If you have the kind of high blood triglycerides that is affected by the amount of carbohydrates in your diet, you will need to eat less sugar and less high-sugar foods. Your caregiver can help you with this.  Adding 2-4 grams of fish oil (EPA+ DHA) may also help lower triglycerides. Speak with your caregiver before adding any supplements to your regimen. Following the Diet  Maintain your ideal weight. Your caregivers can help you with a diet. Generally, eating less food and getting more   exercise will help you lose weight. Joining a weight control group may also help. Ask your caregivers for a good weight control group in your area.  Eat low-fat foods instead of high-fat foods. This can help you lose weight too.  These foods are lower in fat. Eat MORE of these:    Dried beans, peas, and lentils.  Egg whites.  Low-fat cottage cheese.  Fish.  Lean cuts of meat, such as round, sirloin, rump, and flank (cut extra fat off meat you fix).  Whole grain breads, cereals and pasta.  Skim and nonfat dry milk.  Low-fat yogurt.  Poultry without the skin.  Cheese made with skim or part-skim milk, such as mozzarella, parmesan, farmers', ricotta, or pot cheese. These are higher fat foods. Eat LESS of these:   Whole milk and foods made from whole milk, such as American, blue, cheddar, monterey jack, and swiss cheese  High-fat meats, such as luncheon meats, sausages, knockwurst, bratwurst, hot dogs, ribs, corned beef, ground pork, and regular ground beef.  Fried foods. Limit saturated fats in your diet. Substituting unsaturated fat for saturated fat may decrease your blood triglyceride level. You will need to read package labels to know which products contain saturated fats.  These foods are high in saturated fat. Eat LESS of these:   Fried pork skins.  Whole milk.  Skin and fat from poultry.  Palm oil.  Butter.  Shortening.  Cream cheese.  Bacon.  Margarines and baked goods made from listed oils.  Vegetable shortenings.  Chitterlings.  Fat from meats.  Coconut oil.  Palm kernel oil.  Lard.  Cream.  Sour cream.  Fatback.  Coffee whiteners and non-dairy creamers made with these oils.  Cheese made from whole milk. Use unsaturated fats (both polyunsaturated and monounsaturated) moderately. Remember, even though unsaturated fats are better than saturated fats; you still want a diet low in total fat.  These foods are high in unsaturated fat:   Canola oil.  Sunflower oil.  Mayonnaise.  Almonds.  Peanuts.  Pine nuts.  Margarines made with these oils.  Safflower oil.  Olive oil.  Avocados.  Cashews.  Peanut butter.  Sunflower seeds.  Soybean oil.  Peanut  oil.  Olives.  Pecans.  Walnuts.  Pumpkin seeds. Avoid sugar and other high-sugar foods. This will decrease carbohydrates without decreasing other nutrients. Sugar in your food goes rapidly to your blood. When there is excess sugar in your blood, your liver may use it to make more triglycerides. Sugar also contains calories without other important nutrients.  Eat LESS of these:   Sugar, brown sugar, powdered sugar, jam, jelly, preserves, honey, syrup, molasses, pies, candy, cakes, cookies, frosting, pastries, colas, soft drinks, punches, fruit drinks, and regular gelatin.  Avoid alcohol. Alcohol, even more than sugar, may increase blood triglycerides. In addition, alcohol is high in calories and low in nutrients. Ask for sparkling water, or a diet soft drink instead of an alcoholic beverage. Suggestions for planning and preparing meals   Bake, broil, grill or roast meats instead of frying.  Remove fat from meats and skin from poultry before cooking.  Add spices, herbs, lemon juice or vinegar to vegetables instead of salt, rich sauces or gravies.  Use a non-stick skillet without fat or use no-stick sprays.  Cool and refrigerate stews and broth. Then remove the hardened fat floating on the surface before serving.  Refrigerate meat drippings and skim off fat to make low-fat gravies.  Serve more fish.  Use less butter,   margarine and other high-fat spreads on bread or vegetables.  Use skim or reconstituted non-fat dry milk for cooking.  Cook with low-fat cheeses.  Substitute low-fat yogurt or cottage cheese for all or part of the sour cream in recipes for sauces, dips or congealed salads.  Use half yogurt/half mayonnaise in salad recipes.  Substitute evaporated skim milk for cream. Evaporated skim milk or reconstituted non-fat dry milk can be whipped and substituted for whipped cream in certain recipes.  Choose fresh fruits for dessert instead of high-fat foods such as pies or  cakes. Fruits are naturally low in fat. When Dining Out   Order low-fat appetizers such as fruit or vegetable juice, pasta with vegetables or tomato sauce.  Select clear, rather than cream soups.  Ask that dressings and gravies be served on the side. Then use less of them.  Order foods that are baked, broiled, poached, steamed, stir-fried, or roasted.  Ask for margarine instead of butter, and use only a small amount.  Drink sparkling water, unsweetened tea or coffee, or diet soft drinks instead of alcohol or other sweet beverages. QUESTIONS AND ANSWERS ABOUT OTHER FATS IN THE BLOOD: SATURATED FAT, TRANS FAT, AND CHOLESTEROL What is trans fat? Trans fat is a type of fat that is formed when vegetable oil is hardened through a process called hydrogenation. This process helps makes foods more solid, gives them shape, and prolongs their shelf life. Trans fats are also called hydrogenated or partially hydrogenated oils.  What do saturated fat, trans fat, and cholesterol in foods have to do with heart disease? Saturated fat, trans fat, and cholesterol in the diet all raise the level of LDL "bad" cholesterol in the blood. The higher the LDL cholesterol, the greater the risk for coronary heart disease (CHD). Saturated fat and trans fat raise LDL similarly.  What foods contain saturated fat, trans fat, and cholesterol? High amounts of saturated fat are found in animal products, such as fatty cuts of meat, chicken skin, and full-fat dairy products like butter, whole milk, cream, and cheese, and in tropical vegetable oils such as palm, palm kernel, and coconut oil. Trans fat is found in some of the same foods as saturated fat, such as vegetable shortening, some margarines (especially hard or stick margarine), crackers, cookies, baked goods, fried foods, salad dressings, and other processed foods made with partially hydrogenated vegetable oils. Small amounts of trans fat also occur naturally in some animal  products, such as milk products, beef, and lamb. Foods high in cholesterol include liver, other organ meats, egg yolks, shrimp, and full-fat dairy products. How can I use the new food label to make heart-healthy food choices? Check the Nutrition Facts panel of the food label. Choose foods lower in saturated fat, trans fat, and cholesterol. For saturated fat and cholesterol, you can also use the Percent Daily Value (%DV): 5% DV or less is low, and 20% DV or more is high. (There is no %DV for trans fat.) Use the Nutrition Facts panel to choose foods low in saturated fat and cholesterol, and if the trans fat is not listed, read the ingredients and limit products that list shortening or hydrogenated or partially hydrogenated vegetable oil, which tend to be high in trans fat. POINTS TO REMEMBER:   Discuss your risk for heart disease with your caregivers, and take steps to reduce risk factors.  Change your diet. Choose foods that are low in saturated fat, trans fat, and cholesterol.  Add exercise to your daily routine if   it is not already being done. Participate in physical activity of moderate intensity, like brisk walking, for at least 30 minutes on most, and preferably all days of the week. No time? Break the 30 minutes into three, 10-minute segments during the day.  Stop smoking. If you do smoke, contact your caregiver to discuss ways in which they can help you quit.  Do not use street drugs.  Maintain a normal weight.  Maintain a healthy blood pressure.  Keep up with your blood work for checking the fats in your blood as directed by your caregiver. Document Released: 12/22/2003 Document Revised: 09/04/2011 Document Reviewed: 07/19/2008 [employer] Patient Information 2013 [employer].  

## 2012-03-08 ENCOUNTER — Telehealth: Payer: Self-pay | Admitting: *Deleted

## 2012-03-08 NOTE — Telephone Encounter (Signed)
left voice message to inform the patient that his appointment needs to be rescheduled

## 2012-03-10 ENCOUNTER — Other Ambulatory Visit (HOSPITAL_COMMUNITY): Payer: Self-pay | Admitting: Cardiovascular Disease

## 2012-03-10 DIAGNOSIS — R0602 Shortness of breath: Secondary | ICD-10-CM

## 2012-03-10 DIAGNOSIS — R079 Chest pain, unspecified: Secondary | ICD-10-CM

## 2012-03-11 ENCOUNTER — Telehealth: Payer: Self-pay | Admitting: *Deleted

## 2012-03-11 NOTE — Telephone Encounter (Signed)
former patient of dr.rubin re-establishing with dr.jackie hunter

## 2012-03-13 ENCOUNTER — Ambulatory Visit (INDEPENDENT_AMBULATORY_CARE_PROVIDER_SITE_OTHER): Payer: Self-pay | Admitting: Surgery

## 2012-03-13 ENCOUNTER — Ambulatory Visit (INDEPENDENT_AMBULATORY_CARE_PROVIDER_SITE_OTHER): Payer: Managed Care, Other (non HMO) | Admitting: Surgery

## 2012-03-13 ENCOUNTER — Encounter (INDEPENDENT_AMBULATORY_CARE_PROVIDER_SITE_OTHER): Payer: Self-pay | Admitting: Surgery

## 2012-03-13 VITALS — BP 132/80 | HR 96 | Temp 97.6°F | Resp 18 | Ht 71.0 in | Wt 226.8 lb

## 2012-03-13 DIAGNOSIS — K7689 Other specified diseases of liver: Secondary | ICD-10-CM

## 2012-03-13 DIAGNOSIS — K760 Fatty (change of) liver, not elsewhere classified: Secondary | ICD-10-CM

## 2012-03-13 NOTE — Progress Notes (Signed)
Chief Complaint:  Splenomegaly with history of thrombocytopenia, hypertriglyceridemia with a history of recurrent pancreatitis and small pseudocyst in the tail of the pancreas  History of Present Illness:  Juan [name] is an 46 y.o. male who has survived non-Hodgkin's lymphoma treated at [facility] and has recurrent bouts of pancreatitis related to his hypertriglyceridemia. He has 2 diabetes and hepatic steatosis evident on CT scan. He seen recently Dr. York [name] and was scheduled to see Juan [name] in early January. I informed him of Dr. Gita [name] departure from [facility].  Dr. Gery [name] has been working on trying to get an appointment for him with Dr. Hulda [name] at [facility]. Dr. Eligah [name] is a endocrinology and lipid specialist which would be perfect for Juan [name]'s hypertriglyceridemia. I'm going to try to facilitate a consultation with Dr. Eligah [name]. I mentioned getting him in to see Dr. Cyndie [name].  I will see him back in about 6 weeks.  Past Medical History  Diagnosis Date  . Pancreatitis     occasional - last episode 06/2011  . Hyperlipidemia   . Fatty liver disease, nonalcoholic 06/16/2011  . Anemia     occasional - no problems currently(10/02/2011)  . Diabetes mellitus     IDDM  . Bilateral renal cysts 06/16/2011    states no known problems  . Non Hodgkin's lymphoma 1991  . Hypertension     states no dx. of HTN, takes med. to protect kidneys due to DM  . Splenomegaly, congestive, chronic   . Stuffy and runny nose 10/02/2011    yellow drainage from nose  . Loose body in knee 09/2011    loose bodies left knee  . Lateral meniscus tear 09/2011    left  . Blood transfusion without reported diagnosis     Past Surgical History  Procedure Date  . Nasal septum surgery   . Tonsillectomy   . Elbow surgery   . Hernia repair     inguinal   . Tibia soft tissue biopsy x 3    left  . Knee arthroscopy 10/09/2011    Procedure: ARTHROSCOPY KNEE;  Surgeon: [name], MD;  Location: [facility];  Service: Orthopedics;  Laterality: Left;    Current Outpatient Prescriptions  Medication Sig Dispense Refill  . fenofibrate 160 MG tablet Take 160 mg by mouth daily. PM      . insulin NPH (HUMULIN N,NOVOLIN N) 100 UNIT/ML injection Inject into the skin 3 (three) times daily with meals. 60 units every morning, 40 units with lunch, 80 units with dinner.      . insulin regular human CONCENTRATED (HUMULIN R) 500 UNIT/ML SOLN injection Inject into the skin 3 (three) times daily with meals. 30 units AM, 40 units midday, 35 units PM Also sliding scale as needed      . Insulin Syringe-Needle U-100 (INSULIN SYRINGE 1CC/31GX5/16") 31G X 5/16" 1 ML MISC       . meloxicam (MOBIC) 15 MG tablet Take 15 mg by mouth daily. PM      . metFORMIN (GLUCOPHAGE) 1000 MG tablet Take 1,000 mg by mouth 2 (two) times daily with a meal.      . ONE TOUCH ULTRA TEST test strip       . ramipril (ALTACE) 5 MG capsule Take 2.5 mg by mouth daily. PM      . rosuvastatin (CRESTOR) 20 MG tablet Take 20 mg by mouth daily. PM      . traMADol (ULTRAM-ER) 300 MG 24 hr tablet  Review of patient's allergies indicates no active allergies. Family History  Problem Relation Age of Onset  . Adopted: Yes   Social History:   reports that he has never smoked. He has never used smokeless tobacco. He reports that he does not drink alcohol or use illicit drugs.   REVIEW OF SYSTEMS - PERTINENT POSITIVES ONLY: No new findings  Physical Exam:   Blood pressure 132/80, pulse 96, temperature 97.6 F (36.4 C), temperature source Temporal, resp. rate 18, height 5\' 11"  (1.803 m), weight 226 lb 12.8 oz (102.876 kg). Body mass index is 31.63 kg/(m^2).  Gen:  WDWN WM NAD  Neurological: Alert and oriented to person, place, and time. Motor and sensory function is grossly intact  Head: Normocephalic and atraumatic.  Eyes: Conjunctivae are normal. Pupils are equal, round, and reactive to light. No scleral icterus.  Neck:  Normal range of motion. Neck supple. No tracheal deviation or thyromegaly present.  Cardiovascular:  SR without murmurs or gallops.  No carotid bruits Respiratory: Effort normal.  No respiratory distress. No chest wall tenderness. Breath sounds normal.  No wheezes, rales or rhonchi.  Abdomen:  Left anterior chest wall tenderness for which I can find no cause. I doubt this is related to splenomegaly. His spleen measures 20 cm by MR scan. GU: Musculoskeletal: Normal range of motion. Extremities are nontender. No cyanosis, edema or clubbing noted Lymphadenopathy: No cervical, preauricular, postauricular or axillary adenopathy is present Skin: Skin is warm and dry. No rash noted. No diaphoresis. No erythema. No pallor. Pscyh: Normal mood and affect. Behavior is normal. Judgment and thought content normal.   LABORATORY RESULTS: No results found for this or any previous visit (from the past 48 hour(s)).  RADIOLOGY RESULTS: No results found.  Problem List: Patient Active Problem List  Diagnosis  . Chronic pancreatitis  . Hyperlipidemia  . Hyperkalemia  . Acute kidney injury  . Dehydration  . Uncontrolled type 2 DM with hyperosmolar nonketotic hyperglycemia  . Microcytic anemia  . Fatty liver disease, nonalcoholic  . Bilateral renal cysts  . Splenomegaly  . Thrombocytopenia    Assessment & Plan: Refer to Dr. Eligah [name] at [facility] due for management of hypertriglyceridemia. Followup in 6 weeks    Matt B. [name], MD, [facility] [phone] beeper [phone]  03/13/2012 4:46 PM

## 2012-03-13 NOTE — Patient Instructions (Signed)
Make appt with Dr. Ruby Cola at [facility]:  [phone]

## 2012-03-20 ENCOUNTER — Telehealth: Payer: Self-pay | Admitting: *Deleted

## 2012-03-20 ENCOUNTER — Ambulatory Visit: Payer: Managed Care, Other (non HMO) | Admitting: Oncology

## 2012-03-20 NOTE — Telephone Encounter (Signed)
Patient confirmed over the phone the new provider on 03-24-2012 starting at 8:30

## 2012-03-21 ENCOUNTER — Other Ambulatory Visit: Payer: Self-pay

## 2012-03-21 DIAGNOSIS — D696 Thrombocytopenia, unspecified: Secondary | ICD-10-CM

## 2012-03-21 DIAGNOSIS — K861 Other chronic pancreatitis: Secondary | ICD-10-CM

## 2012-03-22 ENCOUNTER — Encounter: Payer: Self-pay | Admitting: Oncology

## 2012-03-24 ENCOUNTER — Other Ambulatory Visit: Payer: Self-pay | Admitting: Oncology

## 2012-03-24 ENCOUNTER — Telehealth: Payer: Self-pay | Admitting: Oncology

## 2012-03-24 ENCOUNTER — Ambulatory Visit (HOSPITAL_BASED_OUTPATIENT_CLINIC_OR_DEPARTMENT_OTHER): Payer: Managed Care, Other (non HMO) | Admitting: Family

## 2012-03-24 ENCOUNTER — Other Ambulatory Visit (HOSPITAL_BASED_OUTPATIENT_CLINIC_OR_DEPARTMENT_OTHER): Payer: Managed Care, Other (non HMO) | Admitting: Lab

## 2012-03-24 ENCOUNTER — Encounter: Payer: Self-pay | Admitting: Family

## 2012-03-24 VITALS — BP 149/82 | HR 100 | Temp 98.5°F | Resp 18 | Ht 71.0 in | Wt 227.9 lb

## 2012-03-24 DIAGNOSIS — R7402 Elevation of levels of lactic acid dehydrogenase (LDH): Secondary | ICD-10-CM

## 2012-03-24 DIAGNOSIS — D509 Iron deficiency anemia, unspecified: Secondary | ICD-10-CM

## 2012-03-24 DIAGNOSIS — Z87898 Personal history of other specified conditions: Secondary | ICD-10-CM

## 2012-03-24 DIAGNOSIS — K861 Other chronic pancreatitis: Secondary | ICD-10-CM

## 2012-03-24 DIAGNOSIS — D696 Thrombocytopenia, unspecified: Secondary | ICD-10-CM

## 2012-03-24 DIAGNOSIS — R161 Splenomegaly, not elsewhere classified: Secondary | ICD-10-CM

## 2012-03-24 LAB — CBC & DIFF AND RETIC
BASO%: 1.4 % (ref 0.0–2.0)
EOS%: 3.7 % (ref 0.0–7.0)
HCT: 35.5 % — ABNORMAL LOW (ref 38.4–49.9)
HGB: 11.8 g/dL — ABNORMAL LOW (ref 13.0–17.1)
Immature Retic Fract: 17.1 % — ABNORMAL HIGH (ref 3.00–10.60)
MCHC: 33.2 g/dL (ref 32.0–36.0)
MONO#: 0.4 10*3/uL (ref 0.1–0.9)
NEUT%: 57.8 % (ref 39.0–75.0)
RDW: 16.6 % — ABNORMAL HIGH (ref 11.0–14.6)
Retic %: 2.91 % — ABNORMAL HIGH (ref 0.80–1.80)
Retic Ct Abs: 135.61 10*3/uL — ABNORMAL HIGH (ref 34.80–93.90)
WBC: 5.9 10*3/uL (ref 4.0–10.3)
lymph#: 1.8 10*3/uL (ref 0.9–3.3)

## 2012-03-24 LAB — IRON AND TIBC
%SAT: 11 % — ABNORMAL LOW (ref 20–55)
Iron: 35 ug/dL — ABNORMAL LOW (ref 42–165)
TIBC: 318 ug/dL (ref 215–435)
UIBC: 283 ug/dL (ref 125–400)

## 2012-03-24 LAB — COMPREHENSIVE METABOLIC PANEL
ALT: 74 U/L — ABNORMAL HIGH (ref 0–53)
AST: 60 U/L — ABNORMAL HIGH (ref 0–37)
Alkaline Phosphatase: 63 U/L (ref 39–117)
BUN: 16 mg/dL (ref 6–23)
Chloride: 103 mEq/L (ref 96–112)
Creatinine, Ser: 0.89 mg/dL (ref 0.50–1.35)
Total Bilirubin: 0.4 mg/dL (ref 0.3–1.2)

## 2012-03-24 LAB — FERRITIN: Ferritin: 101 ng/mL (ref 22–322)

## 2012-03-24 LAB — AMYLASE: Amylase: 48 U/L (ref 0–105)

## 2012-03-24 NOTE — Progress Notes (Signed)
Patient ID: Juan [name], male   DOB: Oct 02, 1965, 47 y.o.   MRN: [id number] CSN: [id number]  CC:  Juan Manges, FNP-C  Juan Murphy, MD Juan Batty, MD Juan Frames, MD   Problem List: CHUCK [name] is a 47 y.o. Caucasian male with a problem list consisting of:  1.  Non-Hodgkin lymphoma involving the left tibia diagnosed on 09/21/1988 at [facility] in [place], [place]. Bone marrow biopsy and liver biopsy in 1990 were negative.  The patient underwent 1 cycle of Promace MOPP in 1990 followed by 8 cycles of Promace CytaBOM from 10/1988 - 01/1989.  He had radiation therapy from 10/1988 - 01/1989. 2.  Left tibia bone biopsy x 3 3.  Thrombocytopenia 4.  DM type 2 5.  Splenomegaly 6.  Chronic pancreatitis 7.  Hyperlipidemia/hypertriglyceridemia 8.  Severe hepatic steatosis 9.  Hyperkalemia 10. Diastasis recti 11. Recurrent cellulitis of the right lower extremity 12. Possible splenic vein thrombosis found on MRI of the abdomen on 02/07/2012. 13. Bilateral renal cysts found on MRI of the abdomen on 02/07/2012. 14. Pseudocyst/complex cystic lesion on tail of pancreas found on abdominal CT on 02/04/2012. 15. Varices are noted within left upper quadrant on abdominal CT on 02/04/2012.  Dr. Arline Asp and I saw Juan [name] for follow up of his non-Hodgkin's lymphoma involving left tibia diagnosed in late 1990 status post chemotherapy and radiation therapy delivered at [facility] in [place], [place].  Juan [name] was last seen by Dr. Donnie Coffin on 10/17/2009.  Juan [name] also has a history significant for thrombocytopenia (possibly related to hypersplenism) and a long-standing history of recurrent pancreatitis secondary to elevated triglyceride levels.  Juan [name] was seen recently seen by Dr. Luretha [name] for his ongoing left-sided abdominal pain that has persisted for the past 3 months.  It appears that Juan [name] was referred by Juan [name] to Dr. Eligah East at [facility] for management of his hypertriglyceridemia.  Juan [name]  has complaints of ongoing night sweats that have persisted since the 1990's.  He also has complains of an irregular/missed heartbeats that happened within the past week.  Juan [name] is scheduled to have a nuclear stress test and 2D Echocardiogram on 03/26/2012 ordered by Dr. Allyson Sabal.  Juan [name] denies any other symptomatology.     Past Medical History: Past Medical History  Diagnosis Date  . Pancreatitis     occasional - last episode 06/2011  . Hyperlipidemia   . Fatty liver disease, nonalcoholic 06/16/2011  . Anemia     occasional - no problems currently(10/02/2011)  . Diabetes mellitus     IDDM  . Bilateral renal cysts 06/16/2011    states no known problems  . Non Hodgkin's lymphoma 1991  . Hypertension     states no dx. of HTN, takes med. to protect kidneys due to DM  . Splenomegaly, congestive, chronic   . Stuffy and runny nose 10/02/2011    yellow drainage from nose  . Loose body in knee 09/2011    loose bodies left knee  . Lateral meniscus tear 09/2011    left  . Blood transfusion without reported diagnosis     Surgical History: Past Surgical History  Procedure Date  . Nasal septum surgery   . Tonsillectomy   . Elbow surgery   . Hernia repair     inguinal   . Tibia bone biopsy x 3    left  . Knee arthroscopy 10/09/2011    Procedure: ARTHROSCOPY KNEE;  Surgeon: [name], MD;  Location: [facility];  Service: Orthopedics;  Laterality: Left;    Current Medications: Current Outpatient Prescriptions  Medication Sig Dispense Refill  . fenofibrate 160 MG tablet Take 160 mg by mouth daily. PM      . insulin NPH (HUMULIN N,NOVOLIN N) 100 UNIT/ML injection Inject into the skin 3 (three) times daily with meals. 60 units every morning, 40 units with lunch, 80 units with dinner.      . insulin regular human CONCENTRATED (HUMULIN R) 500 UNIT/ML SOLN injection Inject into the skin 3 (three) times daily with meals. 30 units AM, 40 units midday, 35 units  PM Also sliding scale as needed      . Insulin Syringe-Needle U-100 (INSULIN SYRINGE 1CC/31GX5/16") 31G X 5/16" 1 ML MISC       . metFORMIN (GLUCOPHAGE) 1000 MG tablet Take 1,000 mg by mouth 2 (two) times daily with a meal.      . ONE TOUCH ULTRA TEST test strip       . ramipril (ALTACE) 5 MG capsule Take 2.5 mg by mouth daily. PM      . rosuvastatin (CRESTOR) 20 MG tablet Take 20 mg by mouth daily. PM      . traMADol (ULTRAM-ER) 300 MG 24 hr tablet         Allergies: No Known Allergies  Family History: Family History  Problem Relation Age of Onset  . Adopted: Yes    Social History: History  Substance Use Topics  . Smoking status: Never Smoker   . Smokeless tobacco: Never Used  . Alcohol Use: No    Review of Systems: 10 Point review of systems was completed and is negative except as noted above.   Physical Exam:   Blood pressure 149/82, pulse 100, temperature 98.5 F (36.9 C), temperature source Oral, resp. rate 18, height 5\' 11"  (1.803 m), weight 227 lb 14.4 oz (103.375 kg).  General appearance: Alert, cooperative, well nourished, no apparent distress Head: Normocephalic, without obvious abnormality, atraumatic Eyes: Conjunctivae/corneas clear, PERRLA, EOMI Nose: Nares and mucosa normal, no drainage or sinus tenderness Neck: No adenopathy, supple, symmetrical, trachea midline, thyroid not enlarged, no tenderness Resp: Clear to auscultation bilaterally, diminished bibasilar breath sounds Cardio: Regular rate and rhythm, S1, S2 normal, no murmur, click, rub or gallop GI: Distended and mostly firm, splenomegaly, diffuse tenderness, hypoactive bowel sounds, diastasis recti,  Extremities: Extremities normal, atraumatic, no cyanosis, bilateral LE edema  (RLE > LLE) Skin: Scattered small nodules on extremities and trunk that patient attributes to hyperlipidemia  Lymph nodes: Cervical, supraclavicular, and axillary nodes normal Neurologic: Grossly normal Psych:  Anxious  affect   Laboratory Data: Recent Results (from the past 168 hour(s))  [facility] SMEAR   Collection Time   03/24/12  8:56 AM      Component Value Range   Smear Result Smear Available    FERRITIN   Collection Time   03/24/12  8:56 AM      Component Value Range   Ferritin 101  22 - 322 ng/mL  AMYLASE   Collection Time   03/24/12  8:56 AM      Component Value Range   Amylase 48  0 - 105 U/L  CBC & DIFF AND RETIC   Collection Time   03/24/12  8:56 AM      Component Value Range   WBC 5.9  4.0 - 10.3 10e3/uL   NEUT# 3.4  1.5 - 6.5 10e3/uL   HGB 11.8 (*) 13.0 - 17.1 g/dL   HCT 11.9 (*)  38.4 - 49.9 %   Platelets 165  140 - 400 10e3/uL   MCV 76.2 (*) 79.3 - 98.0 fL   MCH 25.3 (*) 27.2 - 33.4 pg   MCHC 33.2  32.0 - 36.0 g/dL   RBC 6.21  3.08 - 6.57 10e6/uL   RDW 16.6 (*) 11.0 - 14.6 %   lymph# 1.8  0.9 - 3.3 10e3/uL   MONO# 0.4  0.1 - 0.9 10e3/uL   Eosinophils Absolute 0.2  0.0 - 0.5 10e3/uL   Basophils Absolute 0.1  0.0 - 0.1 10e3/uL   NEUT% 57.8  39.0 - 75.0 %   LYMPH% 31.0  14.0 - 49.0 %   MONO% 6.1  0.0 - 14.0 %   EOS% 3.7  0.0 - 7.0 %   BASO% 1.4  0.0 - 2.0 %   nRBC 0  0 - 0 %   Retic % 2.91 (*) 0.80 - 1.80 %   Retic Ct Abs 135.61 (*) 34.80 - 93.90 10e3/uL   Immature Retic Fract 17.10 (*) 3.00 - 10.60 %  TECHNOLOGIST REVIEW   Collection Time   03/24/12  8:56 AM      Component Value Range   Technologist Review       Value: extremely lipemic sample, saline replacement performed  COMPREHENSIVE METABOLIC PANEL   Collection Time   03/24/12  9:38 AM      Component Value Range   Sodium 135  135 - 145 mEq/L   Potassium 4.8  3.5 - 5.3 mEq/L   Chloride 103  96 - 112 mEq/L   CO2 19  19 - 32 mEq/L   Glucose, Bld 207 (*) 70 - 99 mg/dL   BUN 16  6 - 23 mg/dL   Creatinine, Ser 8.46  0.50 - 1.35 mg/dL   Total Bilirubin 0.4  0.3 - 1.2 mg/dL   Alkaline Phosphatase 63  39 - 117 U/L   AST 60 (*) 0 - 37 U/L   ALT 74 (*) 0 - 53 U/L   Total Protein 7.5  6.0 - 8.3 g/dL   Albumin 3.8  3.5 - 5.2  g/dL   Calcium 96.2  8.4 - 95.2 mg/dL  LACTATE DEHYDROGENASE   Collection Time   03/24/12  9:38 AM      Component Value Range   LDH 254 (*) 94 - 250 U/L  IRON AND TIBC   Collection Time   03/24/12 10:48 AM      Component Value Range   Iron 35 (*) 42 - 165 ug/dL   UIBC 841  324 - 401 ug/dL   TIBC 027  253 - 664 ug/dL   %SAT 11 (*) 20 - 55 %     Imaging Studies: 1.  US abdomen complete on 06/15/2011 showed no gallstones are noted within gallbladder. Normal CBD.  Again noted fatty infiltration of the liver.  Significant splenomegaly.  Bilateral renal cyst. No hydronephrosis or diagnostic renal calculus. 2.  Portable chest x-ray on 06/16/2011 showed low volumes with new atelectasis or infiltrate at the left lung base. 3.  CT of the chest with contrast on 02/04/2012 showed no active infiltrate or effusion. Minimal peribronchovascular nodularity in the periphery of the right upper lobe may be residual scarring from prior infection, but atypical infection cannot be excluded.  Age advanced coronary artery calcifications in the distribution of the left anterior descending coronary artery. 4.  CT of the abdomen with contrast on 02/04/2012 showed severe fatty infiltration of the liver with areas of  sparing. No ductal dilatation.  Moderate splenomegaly with varices in the left upper quadrant.  Low attenuation lesion 2.3 x 3.5 x 2.3 cm  in the tail of the pancreas may represent pseudocyst from prior pancreatitis. A cystic pancreatic neoplasm cannot be excluded with this exam. Correlation with patient history is recommended as well as follow-up CT or preferably MRI to assess further. 5. MRI of the abdomen with and without contrast on 02/07/2012 showed a 2.9 cm complex cystic lesion with layering debris along the  pancreatic tail, most suggestive of a pancreatic pseudocyst. Consider a follow-up CT or MRI in 3-6 months as clinically warranted.  Suspected splenic vein thrombosis with splenomegaly and associated  perisplenic collaterals. The presence of well-developed collaterals suggests that this is not acute. Severe hepatic steatosis.   3.1 x 3.7 cm right renal cyst (series 8/image 39). Additional  small bilateral renal cysts measuring up to 12 mm. No hydronephrosis.   Impression/Plan: Mr. Layn Kye is a 47 year-old Caucasian male with a history of non-Hodgkin's lymphoma, with a diagnosis dating back to 37.  He  presented for follow-up today as a former Dr. Donnie Coffin patient.  Dr. Arline Asp noted an elevated LDH and would like to have this laboratory repeated in 1 month.  Mr. Krider was given stool cards to complete at home as he is anemic today.  He denies any obvious source of bleeding.  We plan to see Mr. Holzschuh again in 6 months (08/25/2012) at which time will check CBC, CMP, LDH, Ferritin, RBC Folate and Iron/TIBC.  Dr. Arline Asp is considering repeating an abdominal CT with contrast at that time.  We encourage Mr. Hosie to contact us in the interim if he has any questions or concerns.    Larina Bras, NP-C 03/24/2012, 6:24 PM

## 2012-03-24 NOTE — Telephone Encounter (Signed)
Gave pt MD  appt for June 2014 with labs, lab will add Iron to blood already drawn. Pt given occult blood card

## 2012-03-24 NOTE — Patient Instructions (Addendum)
Please contact us at [phone] if you have any questions or concerns.   Results for orders placed in visit on 03/24/12 (from the past 24 hour(s))  [facility] SMEAR     Status: Normal   Collection Time   03/24/12  8:56 AM      Component Value Range   Smear Result Smear Available     Narrative:    Performed At:  [facility]               [address]               [place], [place] [address]  CBC & DIFF AND RETIC     Status: Abnormal   Collection Time   03/24/12  8:56 AM      Component Value Range   WBC 5.9  4.0 - 10.3 10e3/uL   NEUT# 3.4  1.5 - 6.5 10e3/uL   HGB 11.8 (*) 13.0 - 17.1 g/dL   HCT 60.4 (*) 54.0 - 98.1 %   Platelets 165  140 - 400 10e3/uL   MCV 76.2 (*) 79.3 - 98.0 fL   MCH 25.3 (*) 27.2 - 33.4 pg   MCHC 33.2  32.0 - 36.0 g/dL   RBC 1.91  4.78 - 2.95 10e6/uL   RDW 16.6 (*) 11.0 - 14.6 %   lymph# 1.8  0.9 - 3.3 10e3/uL   MONO# 0.4  0.1 - 0.9 10e3/uL   Eosinophils Absolute 0.2  0.0 - 0.5 10e3/uL   Basophils Absolute 0.1  0.0 - 0.1 10e3/uL   NEUT% 57.8  39.0 - 75.0 %   LYMPH% 31.0  14.0 - 49.0 %   MONO% 6.1  0.0 - 14.0 %   EOS% 3.7  0.0 - 7.0 %   BASO% 1.4  0.0 - 2.0 %   nRBC 0  0 - 0 %   Retic % 2.91 (*) 0.80 - 1.80 %   Retic Ct Abs 135.61 (*) 34.80 - 93.90 10e3/uL   Immature Retic Fract 17.10 (*) 3.00 - 10.60 %   Narrative:    Performed At:  [facility]               [address]               [place], [place] [address]  TECHNOLOGIST REVIEW     Status: Normal   Collection Time   03/24/12  8:56 AM      Component Value Range   Technologist Review       Value: extremely lipemic sample, saline replacement performed   Narrative:    Performed At:  [facility]               [address]               [place], [place] [address]

## 2012-03-25 ENCOUNTER — Telehealth: Payer: Self-pay | Admitting: Medical Oncology

## 2012-03-25 ENCOUNTER — Telehealth: Payer: Self-pay | Admitting: Oncology

## 2012-03-25 ENCOUNTER — Other Ambulatory Visit: Payer: Self-pay | Admitting: Medical Oncology

## 2012-03-25 DIAGNOSIS — D696 Thrombocytopenia, unspecified: Secondary | ICD-10-CM

## 2012-03-25 NOTE — Telephone Encounter (Signed)
I called pt and left a message that Dr. Arline Asp would like to repeat pt's LDH in February. I informed him that the schedulers will call him with an appt and if any questions to call me.

## 2012-03-25 NOTE — Telephone Encounter (Signed)
S/w pt re appt for 2/4.

## 2012-03-26 ENCOUNTER — Ambulatory Visit (HOSPITAL_COMMUNITY)
Admission: RE | Admit: 2012-03-26 | Discharge: 2012-03-26 | Disposition: A | Discharge status: Home / Self Care | Payer: Managed Care, Other (non HMO) | Source: Ambulatory Visit | Attending: Cardiovascular Disease | Admitting: Cardiovascular Disease

## 2012-03-26 DIAGNOSIS — R0602 Shortness of breath: Secondary | ICD-10-CM

## 2012-03-26 DIAGNOSIS — R002 Palpitations: Secondary | ICD-10-CM | POA: Insufficient documentation

## 2012-03-26 DIAGNOSIS — R079 Chest pain, unspecified: Secondary | ICD-10-CM

## 2012-03-26 DIAGNOSIS — E119 Type 2 diabetes mellitus without complications: Secondary | ICD-10-CM | POA: Insufficient documentation

## 2012-03-26 DIAGNOSIS — I10 Essential (primary) hypertension: Secondary | ICD-10-CM | POA: Insufficient documentation

## 2012-03-26 MED ORDER — TECHNETIUM TC 99M SESTAMIBI GENERIC - CARDIOLITE
30.2000 | Freq: Once | INTRAVENOUS | Status: AC | PRN
  Administered 2012-03-26: 30.2 via INTRAVENOUS

## 2012-03-26 MED ORDER — TECHNETIUM TC 99M SESTAMIBI GENERIC - CARDIOLITE
11.0000 | Freq: Once | INTRAVENOUS | Status: AC | PRN
  Administered 2012-03-26: 11 via INTRAVENOUS

## 2012-03-26 MED ORDER — REGADENOSON 0.4 MG/5ML IV SOLN
0.4000 mg | Freq: Once | INTRAVENOUS | Status: AC
  Administered 2012-03-26: 0.4 mg via INTRAVENOUS

## 2012-03-26 NOTE — Procedures (Addendum)
[facility] [address] [place] [address] [phone]  Cardiology Nuclear Med Study  Juan [name] is a 48 y.o. male     MRN : [id number]     DOB: [birth year]  Procedure Date: 03/26/2012  Nuclear Med Background Indication for Stress Test:  Evaluation for Ischemia History:  no prior cardiac history Cardiac Risk Factors: Hypertension, IDDM Type 2, Lipids and Overweight  Symptoms:  Chest Pain, Palpitations and SOB   Nuclear Pre-Procedure Caffeine/Decaff Intake:  7:00pm NPO After: 5:00am   IV Site: R Antecubital  IV 0.9% NS with Angio Cath:  22g  Chest Size (in):  44 IV Started by: Koren Shiver, CNMT  Height: 5\' 11"  (1.803 m)  Cup Size: n/a  BMI:  Body mass index is 31.24 kg/(m^2). Weight:  224 lb (101.606 kg)   Tech Comments:  n/a    Nuclear Med Study 1 or 2 [name] study: 1 [name]  Stress Test Type:  Lexiscan  Order Authorizing Provider:  [name], MD   Resting Radionuclide: Technetium 46m Sestamibi  Resting Radionuclide Dose: 11.0 mCi   Stress Radionuclide:  Technetium 52m Sestamibi  Stress Radionuclide Dose: 30.2 mCi           Stress Protocol Rest HR: 106 Stress HR: 100  Rest BP: 154/87 Stress BP: 140/55  Exercise Time (min): n/a METS: n/a   Predicted Max HR: 175 bpm % Max HR: 57.14 bpm Rate Pressure Product: 74259   Dose of Adenosine (mg):  n/a Dose of Lexiscan: 0.4 mg  Dose of Atropine (mg): n/a Dose of Dobutamine: n/a mcg/kg/min (at max HR)  Stress Test Technologist: Esperanza Sheets, CCT Nuclear Technologist: Gonzella Lex, CNMT   Rest Procedure:  Myocardial perfusion imaging was performed at rest 45 minutes following the intravenous administration of Technetium 27m Sestamibi. Stress Procedure:  The patient received IV Lexiscan 0.4 mg over 15-seconds.  Technetium 71m Sestamibi injected at 30-seconds.  There were Nonspecific ST-T Changes Inferiorly and V-6 with Lexiscan.  Quantitative spect images were  obtained after a 45 minute delay.  Transient Ischemic Dilatation (Normal <1.22):  1.12 Lung/Heart Ratio (Normal <0.45): 0.38 QGS EDV:  89 ml QGS ESV:  35 ml LV Ejection Fraction: 60%  Signed by .     Rest ECG: NSR with non-specific ST-T wave changes  Stress ECG: No significant change from baseline ECG  QPS Raw Data Images:  Normal; no motion artifact; normal heart/lung ratio. Stress Images:  Normal homogeneous uptake in all areas of the myocardium. Rest Images:  Normal homogeneous uptake in all areas of the myocardium. Subtraction (SDS):  No evidence of ischemia.  Impression Exercise Capacity:  Lexiscan with no exercise. BP Response:  Normal blood pressure response. Clinical Symptoms:  No significant symptoms noted. ECG Impression:  No significant ST segment change suggestive of ischemia. Comparison with Prior Nuclear Study: No significant change from previous study  Overall Impression:  Normal stress nuclear study.  LV Wall Motion:  NL LV Function; NL Wall Motion   Runell Gess, MD  03/26/2012 12:37 PM

## 2012-03-26 NOTE — Progress Notes (Signed)
2D Echo Performed 03/26/2012    Clearence Ped, RCS

## 2012-04-22 ENCOUNTER — Other Ambulatory Visit: Payer: Managed Care, Other (non HMO) | Admitting: Lab

## 2012-04-22 DIAGNOSIS — D696 Thrombocytopenia, unspecified: Secondary | ICD-10-CM

## 2012-04-22 LAB — LACTATE DEHYDROGENASE: LDH: 226 U/L (ref 94–250)

## 2012-04-23 ENCOUNTER — Telehealth: Payer: Self-pay | Admitting: Medical Oncology

## 2012-04-23 NOTE — Telephone Encounter (Signed)
I called pt with his LDH results that were normal.

## 2012-05-01 ENCOUNTER — Encounter (INDEPENDENT_AMBULATORY_CARE_PROVIDER_SITE_OTHER): Payer: Managed Care, Other (non HMO) | Admitting: Surgery

## 2012-05-02 ENCOUNTER — Telehealth (INDEPENDENT_AMBULATORY_CARE_PROVIDER_SITE_OTHER): Payer: Self-pay

## 2012-05-02 NOTE — Telephone Encounter (Signed)
LMOM letting pt know that his new appt date and time is set for [facility], March 3rd @240p 

## 2012-05-19 ENCOUNTER — Encounter (INDEPENDENT_AMBULATORY_CARE_PROVIDER_SITE_OTHER): Payer: Managed Care, Other (non HMO) | Admitting: Surgery

## 2012-06-04 DIAGNOSIS — IMO0002 Reserved for concepts with insufficient information to code with codable children: Secondary | ICD-10-CM | POA: Insufficient documentation

## 2012-06-04 DIAGNOSIS — E785 Hyperlipidemia, unspecified: Secondary | ICD-10-CM | POA: Insufficient documentation

## 2012-06-04 DIAGNOSIS — Z79899 Other long term (current) drug therapy: Secondary | ICD-10-CM | POA: Insufficient documentation

## 2012-06-04 DIAGNOSIS — E783 Hyperchylomicronemia: Secondary | ICD-10-CM | POA: Insufficient documentation

## 2012-06-04 DIAGNOSIS — K769 Liver disease, unspecified: Secondary | ICD-10-CM | POA: Insufficient documentation

## 2012-06-05 ENCOUNTER — Telehealth (INDEPENDENT_AMBULATORY_CARE_PROVIDER_SITE_OTHER): Payer: Self-pay

## 2012-06-05 DIAGNOSIS — Z8572 Personal history of non-Hodgkin lymphomas: Secondary | ICD-10-CM | POA: Insufficient documentation

## 2012-06-05 DIAGNOSIS — Z8719 Personal history of other diseases of the digestive system: Secondary | ICD-10-CM | POA: Insufficient documentation

## 2012-06-05 DIAGNOSIS — Z87898 Personal history of other specified conditions: Secondary | ICD-10-CM | POA: Insufficient documentation

## 2012-06-05 NOTE — Telephone Encounter (Signed)
LMOM asking pt to call our office back to let us know wheather [facility] has contacted him in regards to seeing Dr. Eligah East.

## 2012-07-18 ENCOUNTER — Encounter: Payer: Self-pay | Admitting: Cardiovascular Disease

## 2012-08-25 ENCOUNTER — Encounter: Payer: Self-pay | Admitting: Oncology

## 2012-08-25 ENCOUNTER — Other Ambulatory Visit (HOSPITAL_BASED_OUTPATIENT_CLINIC_OR_DEPARTMENT_OTHER): Payer: Managed Care, Other (non HMO) | Admitting: Lab

## 2012-08-25 ENCOUNTER — Telehealth: Payer: Self-pay | Admitting: Oncology

## 2012-08-25 ENCOUNTER — Ambulatory Visit (HOSPITAL_BASED_OUTPATIENT_CLINIC_OR_DEPARTMENT_OTHER): Payer: Managed Care, Other (non HMO) | Admitting: Oncology

## 2012-08-25 VITALS — BP 130/81 | HR 76 | Temp 97.5°F | Resp 18 | Ht 71.0 in | Wt 210.8 lb

## 2012-08-25 DIAGNOSIS — D509 Iron deficiency anemia, unspecified: Secondary | ICD-10-CM

## 2012-08-25 DIAGNOSIS — C8589 Other specified types of non-Hodgkin lymphoma, extranodal and solid organ sites: Secondary | ICD-10-CM

## 2012-08-25 DIAGNOSIS — D696 Thrombocytopenia, unspecified: Secondary | ICD-10-CM

## 2012-08-25 DIAGNOSIS — R161 Splenomegaly, not elsewhere classified: Secondary | ICD-10-CM

## 2012-08-25 LAB — CBC WITH DIFFERENTIAL/PLATELET
Eosinophils Absolute: 0.2 10*3/uL (ref 0.0–0.5)
MCV: 76.2 fL — ABNORMAL LOW (ref 79.3–98.0)
MONO%: 7.1 % (ref 0.0–14.0)
NEUT#: 2.7 10*3/uL (ref 1.5–6.5)
RBC: 4.98 10*6/uL (ref 4.20–5.82)
RDW: 15.4 % — ABNORMAL HIGH (ref 11.0–14.6)
WBC: 5 10*3/uL (ref 4.0–10.3)
lymph#: 1.6 10*3/uL (ref 0.9–3.3)

## 2012-08-25 LAB — LACTATE DEHYDROGENASE (CC13): LDH: 121 U/L — ABNORMAL LOW (ref 125–245)

## 2012-08-25 LAB — COMPREHENSIVE METABOLIC PANEL (CC13)
AST: 25 U/L (ref 5–34)
BUN: 16.3 mg/dL (ref 7.0–26.0)
CO2: 25 mEq/L (ref 22–29)
Calcium: 10.8 mg/dL — ABNORMAL HIGH (ref 8.4–10.4)
Chloride: 104 mEq/L (ref 98–107)
Creatinine: 0.9 mg/dL (ref 0.7–1.3)
Total Bilirubin: 0.36 mg/dL (ref 0.20–1.20)

## 2012-08-25 NOTE — Progress Notes (Signed)
This office note has been dictated.  #[id number]

## 2012-08-25 NOTE — Progress Notes (Signed)
CC:   Juan Saupe, MD Juan [name]. Juan Deutscher, MD Juan [name], M.D. Juan [name], M.D.  PROBLEM LIST: 1. Non-Hodgkin lymphoma involving the left tibia diagnosed on 09/21/1988 at [facility] [place]. Bone marrow biopsy and liver biopsy in 1990 were negative. The patient underwent 1 cycle of Promace MOPP in 1990 followed by 8 cycles of Promace CytaBOM from 10/1988 - 01/1989. He had radiation therapy from 10/1988 - 01/1989. The patient states that he had a diffuse large cell lymphoma involving his left tibia and distal left femur, i.e., stage IV.  He has remained disease-free since completing therapy in late 1990. [facility] patient number was [id number]. 2. Left tibia bone biopsy x 3  3. Thrombocytopenia suspected to be on the basis of chronic ITP. 4. DM type 2  5. Splenomegaly suspected to be secondary to splenic vein thrombosis     possibly related to previous episodes of chronic pancreatitis. 6. Chronic pancreatitis  7. Hyperlipidemia/hypertriglyceridemia  8. Severe hepatic steatosis  9. Hypercalcemia  10. Diastasis recti  11. Recurrent cellulitis of the right lower extremity  12. Possible splenic vein thrombosis found on MRI of the abdomen on 02/07/2012.  13. Bilateral renal cysts found on MRI of the abdomen on 02/07/2012.  14. Pseudocyst/complex cystic lesion on tail of pancreas found on abdominal CT on 02/04/2012.  15. Varices noted within left upper quadrant on abdominal CT on 02/04/2012.   MEDICATIONS:  Reviewed and recorded. Current Outpatient Prescriptions  Medication Sig Dispense Refill  . fenofibrate 160 MG tablet Take 160 mg by mouth daily. PM      . insulin NPH (HUMULIN N,NOVOLIN N) 100 UNIT/ML injection Inject into the skin 3 (three) times daily with meals. 60 units every morning, 40 units with lunch, 80 units with dinner.      . insulin regular human CONCENTRATED (HUMULIN R) 500 UNIT/ML SOLN injection Inject into the skin 3 (three) times daily with meals. 30 units AM, 40 units  midday, 35 units PM Also sliding scale as needed      . Insulin Syringe-Needle U-100 (INSULIN SYRINGE 1CC/31GX5/16") 31G X 5/16" 1 ML MISC       . metFORMIN (GLUCOPHAGE) 1000 MG tablet Take 1,000 mg by mouth 2 (two) times daily with a meal.      . ONE TOUCH ULTRA TEST test strip       . ramipril (ALTACE) 5 MG capsule Take 2.5 mg by mouth daily. PM      . rosuvastatin (CRESTOR) 20 MG tablet Take 20 mg by mouth daily. PM      . traMADol (ULTRAM-ER) 300 MG 24 hr tablet        No current facility-administered medications for this visit.     SMOKING HISTORY:  Patient has never smoked cigarettes.  HISTORY:  I am seeing Juan [name] today for followup of his history of thrombocytopenia in conjunction with splenomegaly and a remote history of non-Hodgkin lymphoma treated at the [facility] in 1990 without evidence of recurrence.  The patient was last seen by [place] on 03/24/2012.  He was seen by Juan [name] following the departure of Juan [name]. Fortunately, the patient has done well.  He is really without any complaints.  He works full-time at [employer].  No fever, chills, night sweats.  No bleeding or bruising problems.  His main problem appears to be severe hypertriglyceridemia which is being treated by physicians at [facility].  As stated, the patient is without any complaints or problems today.  No bleeding or  bruising.  PHYSICAL EXAMINATION:  General:  He looks well.  He has been trying to lose weight and has lost approximately 20 pounds from his last visit here 5 months ago.  Weight today is 210 pounds 12.8 ounces, height 5 feet 11 inches, body surface area 2.19 sq m.  Vital Signs:  Blood pressure 130/81.  Other vital signs are normal.  HEENT:  There is no scleral icterus.  Mouth and pharynx are benign.  There is no peripheral adenopathy palpable.  Heart and lungs:  Normal.  Abdomen:  Slightly obese.  Nontender with no organomegaly or masses palpable.  Despite knowledge of the fact that  the patient does have an enlarged spleen, I was really unable to convince myself that I could feel his spleen.  No other suspicious findings.  He does have some stretch marks over his abdomen.  Extremities:  He has nodules over the extensor surface of his arms near his elbow area felt to be due to lipid deposits.  There is trace to 1+ edema of both ankles.  No petechiae or purpura.  Neurologic: Exam is grossly normal.  LABORATORY DATA:  Today, white count 5.0, ANC 2.7, hemoglobin 13.1, hematocrit 38.0, platelets 87,000.  Chemistries notable for a calcium of 10.8, albumin 3.7, BUN 16, and creatinine 0.9.  LDH was 121.  In looking through the patient's labs dating back the last few years, I see quite striking variability in the patient's platelet count.  The patient denies any alcohol history.  Thus, I am inclined to think that the patient may have chronic ITP.  We have several platelet counts that are close to 100,000 and, in fact, today's platelet count is 87,000.  On the other hand, we have platelet counts that range between 150,000 and 200,000.  In addition, the patient's calcium today is slightly elevated at 10.8.  We have seen some elevated calcium levels in the past, for example 12.2 on 06/15/2011.  The patient says he does not take vitamin D or calcium.  On 03/24/2012 ferritin was 101.  On 06/16/2011 serum folate was 10.7.  On 06/16/2011 vitamin B12 level was 539.  IMAGING STUDIES:    1. US abdomen complete on 06/15/2011 showed no gallstones are noted within gallbladder. Normal CBD. Again noted fatty infiltration of the liver. Significant splenomegaly. Bilateral renal cyst. No hydronephrosis or diagnostic renal calculus. Splenic volume was 1418.7 mL.  The spleen measured 17.5 cm in length. 2. Portable chest x-ray on 06/16/2011 showed low volumes with new atelectasis or infiltrate at the left lung base.  3. CT of the chest with contrast on 02/04/2012 showed no active infiltrate or  effusion. Minimal peribronchovascular nodularity in the periphery of the right upper lobe may be residual scarring from prior infection, but atypical infection cannot be excluded. Age advanced coronary artery calcifications in the distribution of the left anterior descending coronary artery.  4. CT of the abdomen with contrast on 02/04/2012 showed severe fatty infiltration of the liver with areas of sparing. No ductal dilatation.  Moderate splenomegaly with varices in the left upper quadrant. Low attenuation lesion 2.3 x 3.5 x 2.3 cm in the tail of the pancreas may represent pseudocyst from prior pancreatitis. A cystic pancreatic neoplasm cannot be excluded with this exam. Correlation with patient history is recommended as well as follow-up CT or preferably MRI to assess further.  5. MRI of the abdomen with and without contrast on 02/07/2012 showed a 2.9 cm complex cystic lesion with layering debris along the pancreatic  tail, most suggestive of a pancreatic pseudocyst. Splenomegaly was present with the spleen measuring 20.1 cm in maximum dimension. There was suspected splenic vein thrombosis with associated perisplenic collaterals.  The presence of well-developed collaterals suggests that this is not acute. Severe hepatic steatosis. 3.1 x 3.7 cm right renal cyst (series 8/image 39). Additional small bilateral renal cysts measuring up to 12 mm. No hydronephrosis.     IMPRESSION AND PLAN:  Clinically, the patient is doing well.  He had an elevated LDH at his last visit that when we repeated it was normal. Today the LDH is 121.  The patient's platelet count is quite variable and suggestive of chronic idiopathic thrombocytopenic purpura.  The patient states that he had a bone marrow at the time of the diagnosis of his lymphoma in 1990.  The bone marrow at that time was negative.  The patient was reassured.  He has a markedly enlarged spleen by imaging studies, but I have been unable to appreciate any  splenomegaly physical exam on today's exam as well as the exam of 03/24/2012.  In short, I do not think this patient needs any interventions.  I certainly would not consider him for splenectomy if he remains clinically well.  I do not think he needs a very intensive followup through our office.  I have set him up for CBC to be done in 6 months.  At his request, we will have the patient come back in 1 year for CBC, chemistries, and iron studies.  Patient's calcium also has been slightly elevated and variable.  I suggested he may want to discuss this with Dr. [name].  He certainly could have mild hyperparathyroidism to explain these slight calcium elevations.    ______________________________ Samul Dada, M.D. DSM/MEDQ  D:  08/25/2012  T:  08/25/2012  Job:  [id number]

## 2012-08-25 NOTE — Telephone Encounter (Signed)
gv and printed appt sched and avs for pt  °

## 2012-08-26 LAB — IRON AND TIBC
Iron: 44 ug/dL (ref 42–165)
TIBC: 377 ug/dL (ref 215–435)
UIBC: 333 ug/dL (ref 125–400)

## 2012-08-26 LAB — FERRITIN: Ferritin: 68 ng/mL (ref 22–322)

## 2013-01-15 ENCOUNTER — Encounter: Payer: Self-pay | Admitting: Cardiovascular Disease

## 2013-01-15 ENCOUNTER — Ambulatory Visit (INDEPENDENT_AMBULATORY_CARE_PROVIDER_SITE_OTHER): Payer: Managed Care, Other (non HMO) | Admitting: Cardiovascular Disease

## 2013-01-15 VITALS — BP 168/84 | HR 78 | Ht 71.0 in | Wt 206.7 lb

## 2013-01-15 DIAGNOSIS — I2510 Atherosclerotic heart disease of native coronary artery without angina pectoris: Secondary | ICD-10-CM

## 2013-01-15 DIAGNOSIS — R079 Chest pain, unspecified: Secondary | ICD-10-CM

## 2013-01-15 DIAGNOSIS — E785 Hyperlipidemia, unspecified: Secondary | ICD-10-CM

## 2013-01-15 MED ORDER — ISOSORBIDE MONONITRATE ER 30 MG PO TB24: 15.0000 mg | ORAL_TABLET | Freq: Every day | ORAL | Status: DC

## 2013-01-15 MED ORDER — AMLODIPINE BESYLATE 5 MG PO TABS: 5.0000 mg | ORAL_TABLET | Freq: Every day | ORAL | Status: DC

## 2013-01-15 NOTE — Progress Notes (Signed)
01/15/2013 Juan [name]   1965/04/28  [id number]  Primary Physician Eber Hong, MD Primary Cardiologist: Runell Gess MD [name]   HPI:  The patient returns today for followup. He is a 47 year old mild to moderately overweight, married Caucasian male, father of 1 child who I last saw 3 years ago. He has seen Dr. Royann Shivers twice in 2011. He has a long history of insulin-dependent diabetes as well as extremely high hypertriglyceridemia in the 3000-6000 range with multiple episodes of pancreatitis. He had negative venous Dopplers for DVTs. He does wear compression stockings. He has complained of left inframammary chest pain and increasing shortness of breath. He is not aware of his family history since he was adopted. He had a CT scan of his chest and abdomen which showed a mass at the tail of his pancreas but also showed severe calcification in the LAD. He did have a Myoview stress test performed 2 years ago that was nonischemic. At that time, he had no symptoms of chest pain or shortness of breath. I referred him to Dr. Hulda Marin  at [facility] for more intense treatment of his severe hypertriglyceridemia.since I saw him last in December of last several weeks he developed exertional chest pain and shortness of breath.I am concerned that he has developed progression of disease given all of his risk factors.   Current Outpatient Prescriptions  Medication Sig Dispense Refill  . fenofibrate 160 MG tablet Take 160 mg by mouth daily. PM      . insulin NPH (HUMULIN N,NOVOLIN N) 100 UNIT/ML injection Inject into the skin 3 (three) times daily with meals. 60 units every morning, 40 units with lunch, 80 units with dinner.      . insulin regular human CONCENTRATED (HUMULIN R) 500 UNIT/ML SOLN injection Inject into the skin 3 (three) times daily with meals. 30 units AM, 40 units midday, 35 units PM Also sliding scale as needed      . Insulin Syringe-Needle  U-100 (INSULIN SYRINGE 1CC/31GX5/16") 31G X 5/16" 1 ML MISC       . metFORMIN (GLUCOPHAGE) 1000 MG tablet Take 1,000 mg by mouth 2 (two) times daily with a meal.      . ONE TOUCH ULTRA TEST test strip       . ramipril (ALTACE) 5 MG capsule Take 2.5 mg by mouth daily. PM      . traMADol (ULTRAM-ER) 300 MG 24 hr tablet       . amLODipine (NORVASC) 5 MG tablet Take 1 tablet (5 mg total) by mouth daily.  30 tablet  6  . isosorbide mononitrate (IMDUR) 30 MG 24 hr tablet Take 0.5 tablets (15 mg total) by mouth daily.  15 tablet  6   No current facility-administered medications for this visit.    No Active Allergies  History   Social History  . Marital Status: Married    Spouse Name: [name]    Number of Children: 1  . 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 Smoking status: Never Smoker   . Smokeless tobacco: Never Used  . Alcohol Use: No  . Drug Use: No  . Sexual Activity: Yes   Other Topics Concern  . Not on file   Social History Narrative   Adopted, so no pertinent FH.  Married.  Lives with wife in [place].  Independent of ADLs and ambulation.  Review of Systems: General: negative for chills, fever, night sweats or weight changes.  Cardiovascular: negative for chest pain, dyspnea on exertion, edema, orthopnea, palpitations, paroxysmal nocturnal dyspnea or shortness of breath Dermatological: negative for rash Respiratory: negative for cough or wheezing Urologic: negative for hematuria Abdominal: negative for nausea, vomiting, diarrhea, bright red blood per rectum, melena, or hematemesis Neurologic: negative for visual changes, syncope, or dizziness All other systems reviewed and are otherwise negative except as noted above.    Blood pressure 168/84, pulse 78, height 5\' 11"  (1.803 m), weight 206 lb 11.2 oz (93.759 kg).  General appearance: alert and no distress Neck: no adenopathy, no carotid  bruit, no JVD, supple, symmetrical, trachea midline and thyroid not enlarged, symmetric, no tenderness/mass/nodules Lungs: clear to auscultation bilaterally Heart: regular rate and rhythm, S1, S2 normal, no murmur, click, rub or gallop Abdomen: soft, non-tender; bowel sounds normal; no masses,  no organomegaly  EKG normal sinus rhythm at 78 without ST or T wave changes  ASSESSMENT AND PLAN:   Coronary artery calcification seen on CAT scan Seen on CT scan. Myoview stress test performed 03/26/12 was nonischemic. Since I saw him last he developed exertional chest pain or shortness of breath. I'm concerned that given his type I insulin dependent diabetes and severe hypertriglyceridemia and he's developed progressive obstructive coronary disease. I may begin him on a calcium channel blocker and a long-acting nitrate. In addition, I'm going to perform exercise Myoview stress testing  Hyperlipidemia Currently followed by Dr. Hulda Marin at [facility]. His triglycerides have come down to the 650 range whereas previously they were in the 05-3998 range.      Runell Gess MD FACP,FACC,FAHA, [facility] 01/15/2013 9:39 AM

## 2013-01-15 NOTE — Patient Instructions (Signed)
  Your physician wants you to follow-up with him after the test                                                                 Your physician has recommended you make the following change in your medication: Start amlodipine 5mg  daily.  Start isosorbide mononitrate 30mg  1/2 tablet daily.     Your physician has ordered the following tests: exercise myoview stress test

## 2013-01-15 NOTE — Assessment & Plan Note (Signed)
Seen on CT scan. Myoview stress test performed 03/26/12 was nonischemic. Since I saw him last he developed exertional chest pain or shortness of breath. I'm concerned that given his type I insulin dependent diabetes and severe hypertriglyceridemia and he's developed progressive obstructive coronary disease. I may begin him on a calcium channel blocker and a long-acting nitrate. In addition, I'm going to perform exercise Myoview stress testing

## 2013-01-15 NOTE — Assessment & Plan Note (Signed)
Currently followed by Dr. Hulda Marin at [facility]. His triglycerides have come down to the 650 range whereas previously they were in the 05-3998 range.

## 2013-01-23 ENCOUNTER — Ambulatory Visit (HOSPITAL_COMMUNITY)
Admission: RE | Admit: 2013-01-23 | Discharge: 2013-01-23 | Disposition: A | Discharge status: Home / Self Care | Payer: Managed Care, Other (non HMO) | Source: Ambulatory Visit | Attending: Internal Medicine | Admitting: Internal Medicine

## 2013-01-23 DIAGNOSIS — R079 Chest pain, unspecified: Secondary | ICD-10-CM | POA: Insufficient documentation

## 2013-01-23 MED ORDER — TECHNETIUM TC 99M SESTAMIBI GENERIC - CARDIOLITE
30.0000 | Freq: Once | INTRAVENOUS | Status: AC | PRN
  Administered 2013-01-23: 30 via INTRAVENOUS

## 2013-01-23 MED ORDER — TECHNETIUM TC 99M SESTAMIBI GENERIC - CARDIOLITE
10.0000 | Freq: Once | INTRAVENOUS | Status: AC | PRN
  Administered 2013-01-23: 10 via INTRAVENOUS

## 2013-01-23 NOTE — Procedures (Addendum)
[name] [address] [place] [address] [place] [address] [phone]  Cardiology Nuclear Med Study  Juan [name] is a 47 y.o. male     MRN : [id number]     DOB: Jul 07, 1965  Procedure Date: 01/23/2013  Nuclear Med Background Indication for Stress Test:  Evaluation for Ischemia History:  CAD;Thrombocytopenia;Splenomegaly Cardiac Risk Factors: Hypertension, IDDM Type 1, Lipids and Hyperkalemia  Symptoms:  Chest Pain, DOE, Fatigue, SOB and Left arm weakness   Nuclear Pre-Procedure Caffeine/Decaff Intake:  7:00pm NPO After: 5:00am   IV Site: R Hand  IV 0.9% NS with Angio Cath:  22g  Chest Size (in):  42"  IV Started by: Emmit Pomfret, RN  Height: 5\' 11"  (1.803 m)  Cup Size: n/a  BMI:  Body mass index is 28.74 kg/(m^2). Weight:  206 lb (93.441 kg)   Tech Comments:  n/a    Nuclear Med Study 1 or 2 day study: 1 day  Stress Test Type:  Stress  Order Authorizing Provider:  [name], MD   Resting Radionuclide: Technetium 24m Sestamibi  Resting Radionuclide Dose: 10.2 mCi   Stress Radionuclide:  Technetium 39m Sestamibi  Stress Radionuclide Dose: 30.6 mCi           Stress Protocol Rest HR: 82 Stress HR: 155  Rest BP: 138/92 Stress BP: 169/60  Exercise Time (min): 9:45 METS: 11.3   Predicted Max HR: 174 bpm % Max HR: 89.08 bpm Rate Pressure Product: 21308  Dose of Adenosine (mg):  n/a Dose of Lexiscan: n/a mg  Dose of Atropine (mg): n/a Dose of Dobutamine: n/a mcg/kg/min (at max HR)  Stress Test Technologist: Esperanza Sheets, CCT Nuclear Technologist: Koren Shiver, CNMT   Rest Procedure:  Myocardial perfusion imaging was performed at rest 45 minutes following the intravenous administration of Technetium 26m Sestamibi. Stress Procedure:  The patient performed treadmill exercise using a Bruce  Protocol for 9:45 minutes. The patient stopped due to SOB and denied any chest pain.  There were no significant ST-T wave changes.   Technetium 71m Sestamibi was injected at peak exercise and myocardial perfusion imaging was performed after a brief delay.  Transient Ischemic Dilatation (Normal <1.22):  0.89 Lung/Heart Ratio (Normal <0.45):  0.41 QGS EDV:  95 ml QGS ESV:  42 ml LV Ejection Fraction: 56%  Signed by  Koren Shiver, CNMT  PHYSICIAN INTERPRETATION  Rest ECG: NSR with non-specific ST-T wave changes and Mild ST depressions with TWI in Inferior & Lateral leads  Stress ECG: Significant ST abnormalities consistent with ischemia. and ~3-4 mm lateral ST depressions in II, III, aVF; with ~2 mm STE in avL; ~1 mm downsloping ST depressions in V5-V6.  QPS Raw Data Images:  Acquisition technically good; normal left ventricular size. Stress Images:  There is decreased uptake in the inferior wall.  There is decreased uptake in the septum. There is partial reversibility in this area. There is a Medium to Large sized, moderate intensity reversible perfusion defect noted in the mid to apical Inferior-Inferoseptal wall, consistent with ischemia. Rest Images:  Normal homogeneous uptake in all areas of the myocardium. Subtraction (SDS):  These findings are consistent with ischemia in the RCA or distal Circumflex distribution.  Impression Exercise Capacity:  Excellent exercise capacity. BP Response:  Normal blood pressure response. Clinical Symptoms:  There is dyspnea. ECG Impression:  Significant ST abnormalities consistent with ischemia. Comparison with Prior Nuclear Study: As compared to the most recent study of 03/26/2012, the reversible defect is  completely new.  LV Wall Motion:  NL LV Function; NL Wall Motion   Overall Impression:  High risk stress nuclear study with evidence of newly noted Ischemia in the Inferior-Inferoseptal wall.Marland Kitchen    Marykay Lex, MD  01/23/2013 1:20 PM

## 2013-01-29 ENCOUNTER — Encounter: Payer: Self-pay | Admitting: Cardiovascular Disease

## 2013-01-29 ENCOUNTER — Ambulatory Visit (INDEPENDENT_AMBULATORY_CARE_PROVIDER_SITE_OTHER): Payer: Managed Care, Other (non HMO) | Admitting: Cardiovascular Disease

## 2013-01-29 ENCOUNTER — Encounter (HOSPITAL_COMMUNITY): Payer: Self-pay | Admitting: Pharmacy Technician

## 2013-01-29 VITALS — BP 154/86 | HR 80 | Ht 71.0 in | Wt 211.0 lb

## 2013-01-29 DIAGNOSIS — Z79899 Other long term (current) drug therapy: Secondary | ICD-10-CM

## 2013-01-29 DIAGNOSIS — R079 Chest pain, unspecified: Secondary | ICD-10-CM

## 2013-01-29 DIAGNOSIS — D689 Coagulation defect, unspecified: Secondary | ICD-10-CM

## 2013-01-29 DIAGNOSIS — Z01818 Encounter for other preprocedural examination: Secondary | ICD-10-CM

## 2013-01-29 MED ORDER — ISOSORBIDE MONONITRATE ER 30 MG PO TB24: 30.0000 mg | ORAL_TABLET | Freq: Every day | ORAL | Status: DC

## 2013-01-29 MED ORDER — ASPIRIN EC 81 MG PO TBEC: 81.0000 mg | DELAYED_RELEASE_TABLET | Freq: Every day | ORAL | Status: DC

## 2013-01-29 MED ORDER — METOPROLOL SUCCINATE ER 25 MG PO TB24: 25.0000 mg | ORAL_TABLET | Freq: Every day | ORAL | Status: DC

## 2013-01-29 NOTE — Assessment & Plan Note (Signed)
Over the last month patient has had substernal chest pain with left upper extremity radiation. He did have a CT scan of his chest that showed coronary calcification. Myoview stress test recently performed showed a tear skinnier. I'm going to adjust his antianginal medications and arrange for him to undergo cardiac catheterization via the right radial approach early next week. I have explained risks and benefits to the patient as well as. His wife.

## 2013-01-29 NOTE — Progress Notes (Signed)
01/29/2013 Juan [name]   [birth year]  [id number]  Primary Physician Eber Hong, MD Primary Cardiologist: Runell Gess MD Juan [name]   HPI: The patient returns today for followup. He is a 47 year old mild to moderately overweight, married Caucasian male, father of 1 child who I last saw 3 years ago. He has seen Dr. Royann Shivers twice in 2011. He has a long history of insulin-dependent diabetes as well as extremely high hypertriglyceridemia in the 3000-6000 range with multiple episodes of pancreatitis. He had negative venous Dopplers for DVTs. He does wear compression stockings. He has complained of left inframammary chest pain and increasing shortness of breath. He is not aware of his family history since he was adopted. He had a CT scan of his chest and abdomen which showed a mass at the tail of his pancreas but also showed severe calcification in the LAD. He did have a Myoview stress test performed 2 years ago that was nonischemic. At that time, he had no symptoms of chest pain or shortness of breath. I referred him to Dr. Hulda Marin at [facility] for more intense treatment of his severe hypertriglyceridemia.since I saw him last in December of last several weeks he developed exertional chest pain and shortness of breath.I was concerned that he has developed progression of disease given all of his risk factors. He had a Myoview stress test performed that showed inferior ischemia which is high risk. He continued to have exertional chest pain with left upper irradiation. I'm going to titrate his medications and arrange for him to undergo outpatient cardiac catheterization early next week B. The right radial approach. I thoroughly explained the risks and benefits the patient and his wife.    Current Outpatient Prescriptions  Medication Sig Dispense Refill  . amLODipine (NORVASC) 5 MG tablet Take 1 tablet (5 mg total) by mouth daily.  30 tablet  6  .  fenofibrate 160 MG tablet Take 160 mg by mouth daily. PM      . insulin NPH (HUMULIN N,NOVOLIN N) 100 UNIT/ML injection Inject into the skin 3 (three) times daily with meals. 60 units every morning, 40 units with lunch, 80 units with dinner.      . insulin regular human CONCENTRATED (HUMULIN R) 500 UNIT/ML SOLN injection Inject into the skin 3 (three) times daily with meals. 30 units AM, 40 units midday, 35 units PM Also sliding scale as needed      . Insulin Syringe-Needle U-100 (INSULIN SYRINGE 1CC/31GX5/16") 31G X 5/16" 1 ML MISC       . isosorbide mononitrate (IMDUR) 30 MG 24 hr tablet Take 0.5 tablets (15 mg total) by mouth daily.  15 tablet  6  . metFORMIN (GLUCOPHAGE) 1000 MG tablet Take 1,000 mg by mouth 2 (two) times daily with a meal.      . ONE TOUCH ULTRA TEST test strip       . ramipril (ALTACE) 5 MG capsule Take 2.5 mg by mouth daily. PM      . traMADol (ULTRAM-ER) 300 MG 24 hr tablet        No current facility-administered medications for this visit.    No Known Allergies  History   Social History  . Marital Status: Married    Spouse Name: [name]    Number of Children: 1  . 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 Smoking status: Never  Smoker   . Smokeless tobacco: Never Used  . Alcohol Use: No  . Drug Use: No  . Sexual Activity: Yes   Other Topics Concern  . Not on file   Social History Narrative   Adopted, so no pertinent FH.  Married.  Lives with wife in [place].  Independent of ADLs and ambulation.     Review of Systems: General: negative for chills, fever, night sweats or weight changes.  Cardiovascular: negative for chest pain, dyspnea on exertion, edema, orthopnea, palpitations, paroxysmal nocturnal dyspnea or shortness of breath Dermatological: negative for rash Respiratory: negative for cough or wheezing Urologic: negative for hematuria Abdominal: negative for nausea,  vomiting, diarrhea, bright red blood per rectum, melena, or hematemesis Neurologic: negative for visual changes, syncope, or dizziness All other systems reviewed and are otherwise negative except as noted above.    Blood pressure 154/86, pulse 80, height 5\' 11"  (1.803 m), weight 211 lb (95.709 kg).  General appearance: alert and no distress Neck: no adenopathy, no carotid bruit, no JVD, supple, symmetrical, trachea midline and thyroid not enlarged, symmetric, no tenderness/mass/nodules Lungs: clear to auscultation bilaterally Heart: regular rate and rhythm, S1, S2 normal, no murmur, click, rub or gallop Extremities: extremities normal, atraumatic, no cyanosis or edema  EKG not performed today  ASSESSMENT AND PLAN:   Chest pain Over the last month patient has had substernal chest pain with left upper extremity radiation. He did have a CT scan of his chest that showed coronary calcification. Myoview stress test recently performed showed a tear skinnier. I'm going to adjust his antianginal medications and arrange for him to undergo cardiac catheterization via the right radial approach early next week. I have explained risks and benefits to the patient as well as. His wife.      Runell Gess MD FACP,FACC,FAHA, [facility] 01/29/2013 10:14 AM

## 2013-01-29 NOTE — Patient Instructions (Signed)
Your physician has requested that you have a cardiac catheterization. Cardiac catheterization is used to diagnose and/or treat various heart conditions. Doctors may recommend this procedure for a number of different reasons. The most common reason is to evaluate chest pain. Chest pain can be a symptom of coronary artery disease (CAD), and cardiac catheterization can show whether plaque is narrowing or blocking your heart's arteries. This procedure is also used to evaluate the valves, as well as measure the blood flow and oxygen levels in different parts of your heart. For further information please visit [email or web address]   Following your catheterization, you will not be allowed to drive for 3 days.  No lifting, pushing, or pulling greater that 10 pounds is allowed for 1 week.  When the procedure is scheduled, you will be given a date to have bloodwork and a chest xray done.     Dr Allyson Sabal would like for you to start Aspirin 81mg  daily and Toprol 25mg  daily.  He would also like for you to increase the isosorbide to a full 30mg  tablet daily.

## 2013-02-02 ENCOUNTER — Ambulatory Visit
Admission: RE | Admit: 2013-02-02 | Discharge: 2013-02-02 | Disposition: A | Discharge status: Home / Self Care | Payer: Managed Care, Other (non HMO) | Source: Ambulatory Visit | Attending: Cardiovascular Disease | Admitting: Cardiovascular Disease

## 2013-02-02 DIAGNOSIS — R079 Chest pain, unspecified: Secondary | ICD-10-CM

## 2013-02-02 LAB — BASIC METABOLIC PANEL
CO2: 25 mEq/L (ref 19–32)
Glucose, Bld: 327 mg/dL — ABNORMAL HIGH (ref 70–99)
Potassium: 5 mEq/L (ref 3.5–5.3)
Sodium: 131 mEq/L — ABNORMAL LOW (ref 135–145)

## 2013-02-02 LAB — CBC
Hemoglobin: 13.3 g/dL (ref 13.0–17.0)
RBC: 5.08 MIL/uL (ref 4.22–5.81)

## 2013-02-03 ENCOUNTER — Encounter: Payer: Self-pay | Admitting: *Deleted

## 2013-02-03 ENCOUNTER — Other Ambulatory Visit: Payer: Self-pay | Admitting: *Deleted

## 2013-02-03 ENCOUNTER — Inpatient Hospital Stay (HOSPITAL_COMMUNITY)
Admission: RE | Admit: 2013-02-03 | Discharge: 2013-02-10 | DRG: 233 | Disposition: A | Discharge status: Home / Self Care | Payer: Managed Care, Other (non HMO) | Source: Ambulatory Visit | Attending: Cardiothoracic Surgery | Admitting: Cardiothoracic Surgery

## 2013-02-03 ENCOUNTER — Encounter (HOSPITAL_COMMUNITY)
Admission: RE | Disposition: A | Discharge status: Home / Self Care | Payer: Self-pay | Source: Ambulatory Visit | Attending: Cardiothoracic Surgery

## 2013-02-03 ENCOUNTER — Encounter (HOSPITAL_COMMUNITY): Payer: Self-pay | Admitting: *Deleted

## 2013-02-03 DIAGNOSIS — E785 Hyperlipidemia, unspecified: Secondary | ICD-10-CM | POA: Diagnosis present

## 2013-02-03 DIAGNOSIS — D62 Acute posthemorrhagic anemia: Secondary | ICD-10-CM | POA: Diagnosis not present

## 2013-02-03 DIAGNOSIS — I214 Non-ST elevation (NSTEMI) myocardial infarction: Secondary | ICD-10-CM | POA: Diagnosis present

## 2013-02-03 DIAGNOSIS — Z01818 Encounter for other preprocedural examination: Secondary | ICD-10-CM

## 2013-02-03 DIAGNOSIS — I2510 Atherosclerotic heart disease of native coronary artery without angina pectoris: Secondary | ICD-10-CM

## 2013-02-03 DIAGNOSIS — Z951 Presence of aortocoronary bypass graft: Secondary | ICD-10-CM

## 2013-02-03 DIAGNOSIS — Z794 Long term (current) use of insulin: Secondary | ICD-10-CM

## 2013-02-03 DIAGNOSIS — E119 Type 2 diabetes mellitus without complications: Secondary | ICD-10-CM

## 2013-02-03 DIAGNOSIS — I10 Essential (primary) hypertension: Secondary | ICD-10-CM | POA: Diagnosis present

## 2013-02-03 DIAGNOSIS — R9439 Abnormal result of other cardiovascular function study: Secondary | ICD-10-CM

## 2013-02-03 DIAGNOSIS — K5900 Constipation, unspecified: Secondary | ICD-10-CM | POA: Diagnosis not present

## 2013-02-03 DIAGNOSIS — Z87898 Personal history of other specified conditions: Secondary | ICD-10-CM

## 2013-02-03 DIAGNOSIS — Z91199 Patient's noncompliance with other medical treatment and regimen due to unspecified reason: Secondary | ICD-10-CM

## 2013-02-03 DIAGNOSIS — Z9119 Patient's noncompliance with other medical treatment and regimen: Secondary | ICD-10-CM

## 2013-02-03 DIAGNOSIS — Z7982 Long term (current) use of aspirin: Secondary | ICD-10-CM

## 2013-02-03 DIAGNOSIS — R079 Chest pain, unspecified: Secondary | ICD-10-CM

## 2013-02-03 DIAGNOSIS — D696 Thrombocytopenia, unspecified: Secondary | ICD-10-CM | POA: Diagnosis not present

## 2013-02-03 DIAGNOSIS — E871 Hypo-osmolality and hyponatremia: Secondary | ICD-10-CM | POA: Diagnosis present

## 2013-02-03 DIAGNOSIS — IMO0001 Reserved for inherently not codable concepts without codable children: Secondary | ICD-10-CM | POA: Diagnosis present

## 2013-02-03 HISTORY — DX: Abnormal result of other cardiovascular function study: R94.39

## 2013-02-03 HISTORY — DX: Atherosclerotic heart disease of native coronary artery without angina pectoris: I25.10

## 2013-02-03 HISTORY — DX: Presence of aortocoronary bypass graft: Z95.1

## 2013-02-03 HISTORY — PX: LEFT HEART CATHETERIZATION WITH CORONARY ANGIOGRAM: SHX5451

## 2013-02-03 SURGERY — LEFT HEART CATHETERIZATION WITH CORONARY ANGIOGRAM
Anesthesia: LOCAL

## 2013-02-03 MED ORDER — ISOSORBIDE MONONITRATE ER 30 MG PO TB24
30.0000 mg | ORAL_TABLET | Freq: Every day | ORAL | Status: DC
  Administered 2013-02-03 – 2013-02-04 (×2): 30 mg via ORAL
  Filled 2013-02-03 (×3): qty 1

## 2013-02-03 MED ORDER — ONDANSETRON HCL 4 MG/2ML IJ SOLN: 4.0000 mg | Freq: Four times a day (QID) | INTRAMUSCULAR | Status: DC | PRN

## 2013-02-03 MED ORDER — DIAZEPAM 5 MG PO TABS
5.0000 mg | ORAL_TABLET | ORAL | Status: AC
  Administered 2013-02-03: 5 mg via ORAL
  Filled 2013-02-03: qty 1

## 2013-02-03 MED ORDER — ASPIRIN EC 81 MG PO TBEC: 81.0000 mg | DELAYED_RELEASE_TABLET | Freq: Every day | ORAL | Status: DC

## 2013-02-03 MED ORDER — VERAPAMIL HCL 2.5 MG/ML IV SOLN
INTRAVENOUS | Status: AC
  Filled 2013-02-03: qty 2

## 2013-02-03 MED ORDER — INSULIN REGULAR HUMAN (CONC) 500 UNIT/ML ~~LOC~~ SOLN: 20.0000 [IU] | Freq: Two times a day (BID) | SUBCUTANEOUS | Status: DC

## 2013-02-03 MED ORDER — MORPHINE SULFATE 2 MG/ML IJ SOLN: 2.0000 mg | INTRAMUSCULAR | Status: DC | PRN

## 2013-02-03 MED ORDER — HEPARIN (PORCINE) IN NACL 2-0.9 UNIT/ML-% IJ SOLN
INTRAMUSCULAR | Status: AC
  Filled 2013-02-03: qty 1000

## 2013-02-03 MED ORDER — INSULIN ASPART 100 UNIT/ML ~~LOC~~ SOLN
0.0000 [IU] | Freq: Every day | SUBCUTANEOUS | Status: DC
  Administered 2013-02-03: 23:00:00 5 [IU] via SUBCUTANEOUS

## 2013-02-03 MED ORDER — FENOFIBRATE 160 MG PO TABS
160.0000 mg | ORAL_TABLET | Freq: Every day | ORAL | Status: DC
  Administered 2013-02-03 – 2013-02-04 (×2): 160 mg via ORAL
  Filled 2013-02-03 (×3): qty 1

## 2013-02-03 MED ORDER — INSULIN NPH (HUMAN) (ISOPHANE) 100 UNIT/ML ~~LOC~~ SUSP
20.0000 [IU] | Freq: Once | SUBCUTANEOUS | Status: AC
  Administered 2013-02-03: 23:00:00 20 [IU] via SUBCUTANEOUS

## 2013-02-03 MED ORDER — ASPIRIN 81 MG PO CHEW
81.0000 mg | CHEWABLE_TABLET | ORAL | Status: AC
  Administered 2013-02-03: 81 mg via ORAL
  Filled 2013-02-03: qty 1

## 2013-02-03 MED ORDER — NITROGLYCERIN 0.2 MG/ML ON CALL CATH LAB
INTRAVENOUS | Status: AC
  Filled 2013-02-03: qty 1

## 2013-02-03 MED ORDER — ASPIRIN 81 MG PO CHEW: 81.0000 mg | CHEWABLE_TABLET | Freq: Every day | ORAL | Status: DC

## 2013-02-03 MED ORDER — INSULIN NPH (HUMAN) (ISOPHANE) 100 UNIT/ML ~~LOC~~ SUSP
15.0000 [IU] | Freq: Every day | SUBCUTANEOUS | Status: DC
  Administered 2013-02-04: 15 [IU] via SUBCUTANEOUS

## 2013-02-03 MED ORDER — INSULIN ASPART 100 UNIT/ML ~~LOC~~ SOLN
0.0000 [IU] | Freq: Three times a day (TID) | SUBCUTANEOUS | Status: DC
  Administered 2013-02-04: 08:00:00 15 [IU] via SUBCUTANEOUS
  Administered 2013-02-04: 8 [IU] via SUBCUTANEOUS

## 2013-02-03 MED ORDER — METOPROLOL SUCCINATE ER 25 MG PO TB24
25.0000 mg | ORAL_TABLET | Freq: Every day | ORAL | Status: DC
  Administered 2013-02-03 – 2013-02-04 (×2): 25 mg via ORAL
  Filled 2013-02-03 (×3): qty 1

## 2013-02-03 MED ORDER — ASPIRIN EC 81 MG PO TBEC
81.0000 mg | DELAYED_RELEASE_TABLET | Freq: Every day | ORAL | Status: DC
  Administered 2013-02-04: 81 mg via ORAL
  Filled 2013-02-03 (×2): qty 1

## 2013-02-03 MED ORDER — AMLODIPINE BESYLATE 5 MG PO TABS
5.0000 mg | ORAL_TABLET | Freq: Every day | ORAL | Status: DC
  Administered 2013-02-03 – 2013-02-04 (×2): 5 mg via ORAL
  Filled 2013-02-03 (×3): qty 1

## 2013-02-03 MED ORDER — RAMIPRIL 2.5 MG PO CAPS
2.5000 mg | ORAL_CAPSULE | Freq: Every day | ORAL | Status: DC
  Administered 2013-02-03 – 2013-02-04 (×2): 2.5 mg via ORAL
  Filled 2013-02-03 (×4): qty 1

## 2013-02-03 MED ORDER — SODIUM CHLORIDE 0.9 % IJ SOLN: 3.0000 mL | INTRAMUSCULAR | Status: DC | PRN

## 2013-02-03 MED ORDER — SODIUM CHLORIDE 0.9 % IV SOLN: INTRAVENOUS | Status: AC

## 2013-02-03 MED ORDER — ACETAMINOPHEN 325 MG PO TABS: 650.0000 mg | ORAL_TABLET | ORAL | Status: DC | PRN

## 2013-02-03 MED ORDER — ATROPINE SULFATE 0.1 MG/ML IJ SOLN
INTRAMUSCULAR | Status: AC
  Filled 2013-02-03: qty 10

## 2013-02-03 MED ORDER — FENTANYL CITRATE 0.05 MG/ML IJ SOLN
INTRAMUSCULAR | Status: AC
  Filled 2013-02-03: qty 2

## 2013-02-03 MED ORDER — SODIUM CHLORIDE 0.9 % IV SOLN
INTRAVENOUS | Status: DC
  Administered 2013-02-03: 1000 mL via INTRAVENOUS

## 2013-02-03 MED ORDER — LIDOCAINE HCL (PF) 1 % IJ SOLN
INTRAMUSCULAR | Status: AC
  Filled 2013-02-03: qty 30

## 2013-02-03 MED ORDER — HEPARIN SODIUM (PORCINE) 1000 UNIT/ML IJ SOLN
INTRAMUSCULAR | Status: AC
  Filled 2013-02-03: qty 1

## 2013-02-03 MED ORDER — INSULIN NPH (HUMAN) (ISOPHANE) 100 UNIT/ML ~~LOC~~ SUSP
15.0000 [IU] | Freq: Every day | SUBCUTANEOUS | Status: DC
  Filled 2013-02-03: qty 10

## 2013-02-03 NOTE — Progress Notes (Addendum)
Patient reports that he was a patient at a clinic at [facility] and noted signs reporting Ebola protocol in progress.  States he has cold symptoms including sore throat,cough no fever x 4-5 days.  Okey Regal with infectious disease advised.  She informed her manager Selena Batten who in turn advised that the patient at [facility] did indeed have negative test for Ebola x 2, so we do not have to isolate our patient. CBG phoned to [facility] who advised no further orders

## 2013-02-03 NOTE — Progress Notes (Signed)
TR BAND REMOVAL  LOCATION:    right radial  DEFLATED PER PROTOCOL:    yes  TIME BAND OFF / DRESSING APPLIED:    20:00   SITE UPON ARRIVAL:    Level 0  SITE AFTER BAND REMOVAL:    Level 0  REVERSE ALLEN'S TEST:       CIRCULATION SENSATION AND MOVEMENT:    Within Normal Limits   yes  COMMENTS:   Pt tolerated removal of TR band without complication, VSS, will continue to monitor patient

## 2013-02-03 NOTE — CV Procedure (Signed)
Juan [name] is a 47 y.o. male    [id number] LOCATION:  FACILITY: [facility]  PHYSICIAN: [name], M.D. [birth year]   DATE OF PROCEDURE:  02/03/2013  DATE OF DISCHARGE:     CARDIAC CATHETERIZATION     History obtained from chart review.The patient returns today for followup. He is a 47 year old mild to moderately overweight, married Caucasian male, father of 1 child who I last saw 3 years ago. He has seen Dr. Royann Shivers twice in 2011. He has a long history of insulin-dependent diabetes as well as extremely high hypertriglyceridemia in the 3000-6000 range with multiple episodes of pancreatitis. He had negative venous Dopplers for DVTs. He does wear compression stockings. He has complained of left inframammary chest pain and increasing shortness of breath. He is not aware of his family history since he was adopted. He had a CT scan of his chest and abdomen which showed a mass at the tail of his pancreas but also showed severe calcification in the LAD. He did have a Myoview stress test performed 2 years ago that was nonischemic. At that time, he had no symptoms of chest pain or shortness of breath. I referred him to Dr. Hulda Marin at [facility] for more intense treatment of his severe hypertriglyceridemia.since I saw him last in December of last several weeks he developed exertional chest pain and shortness of breath.I am concerned that he has developed progression of disease given all of his risk factors.    PROCEDURE DESCRIPTION:   The patient was brought to the second floor [place] [facility]iac cath lab in the postabsorptive state. He was premedicated with Valium 5 mg by mouth. His right wristwas prepped and shaved in usual sterile fashion. Xylocaine 1% was used for local anesthesia. A 6 French sheath was inserted into the right radial artery using standard Seldinger technique. The patient received  5000 units  of heparin  intravenously.  A5 [place] TIG catheter along with a  6 [place] EX rad Left  and pigtail catheters were used for selective coronary angiography and left ventriculography respectively. Visipaque dye was used for the entirety of the case. Retrograde aortic, left ventricular end pullback pressures were recorded.    HEMODYNAMICS:    AO SYSTOLIC/AO DIASTOLIC: 120/74   LV SYSTOLIC/LV DIASTOLIC: 115/15  ANGIOGRAPHIC RESULTS:   1. Left main; normal  2. LAD; 90% mid after moderate-sized first diagonal branch which itself had a long 95% ostial/proximal stenosis 3. Left circumflex; nondominant with a 70% segmental proximal AV groove followed by a 90% mid AV groove stenosis after the first marginal branch.  4. Right coronary artery; dominant with a 40-50% mid and 80% long segmental distal stenosis in the "crux" with an occluded PDA in the midportion. There were faint left to right PDA collaterals 5. Left ventriculography; RAO left ventriculogram was performed using  25 mL of Visipaque dye at 12 mL/second. The overall LVEF estimated  60 % Without wall motion abnormalities  IMPRESSION:Mr. Summons has three-vessel disease with preserved LV function, accelerated angina and a positive Myoview. He is a long-standing insulin-dependent diabetic. He will require coronary bypass grafting for complete revascularization. The right radial sheath was removed and a T-R band was placed on the right breast to achieve patent hemostasis. The patient left the Cath Lab in stable condition. He will be hydrated overnight TCTS has been consulted.  Runell Gess MD, [facility] 02/03/2013 4:06 PM

## 2013-02-04 ENCOUNTER — Ambulatory Visit (HOSPITAL_COMMUNITY): Payer: Managed Care, Other (non HMO)

## 2013-02-04 DIAGNOSIS — I2510 Atherosclerotic heart disease of native coronary artery without angina pectoris: Secondary | ICD-10-CM

## 2013-02-04 DIAGNOSIS — Z01810 Encounter for preprocedural cardiovascular examination: Secondary | ICD-10-CM

## 2013-02-04 DIAGNOSIS — E119 Type 2 diabetes mellitus without complications: Secondary | ICD-10-CM

## 2013-02-04 DIAGNOSIS — Z01818 Encounter for other preprocedural examination: Secondary | ICD-10-CM

## 2013-02-04 DIAGNOSIS — E785 Hyperlipidemia, unspecified: Secondary | ICD-10-CM

## 2013-02-04 HISTORY — DX: Atherosclerotic heart disease of native coronary artery without angina pectoris: I25.10

## 2013-02-04 LAB — PULMONARY FUNCTION TEST
DL/VA % pred: 144 %
DL/VA: 6.81 ml/min/mmHg/L
DLCO cor % pred: 129 %
DLCO cor: 43.57 ml/min/mmHg
DLCO unc % pred: 124 %
DLCO unc: 41.88 ml/min/mmHg
FEF 25-75 Post: 3.41 L/sec
FEF 25-75 Pre: 3.54 L/sec
FEF2575-%Change-Post: -3 %
FEF2575-%Pred-Post: 90 %
FEF2575-%Pred-Pre: 94 %
FEV1-%Change-Post: 0 %
FEV1-%Pred-Post: 83 %
FEV1-%Pred-Pre: 84 %
FEV1-Post: 3.5 L
FEV1-Pre: 3.54 L
FEV1FVC-%Change-Post: -2 %
FEV1FVC-%Pred-Pre: 103 %
FEV6-%Change-Post: -1 %
FEV6-%Pred-Post: 82 %
FEV6-%Pred-Pre: 84 %
FEV6-Post: 4.26 L
FEV6-Pre: 4.33 L
FEV6FVC-%Change-Post: 0 %
FEV6FVC-%Pred-Post: 102 %
FEV6FVC-%Pred-Pre: 103 %
FVC-%Change-Post: 1 %
FVC-%Pred-Post: 82 %
FVC-%Pred-Pre: 81 %
FVC-Post: 4.41 L
FVC-Pre: 4.34 L
Post FEV1/FVC ratio: 79 %
Post FEV6/FVC ratio: 99 %
Pre FEV1/FVC ratio: 82 %
Pre FEV6/FVC Ratio: 100 %
RV % pred: 161 %
RV: 3.24 L
TLC % pred: 105 %
TLC: 7.51 L

## 2013-02-04 LAB — GLUCOSE, CAPILLARY
Glucose-Capillary: 268 mg/dL — ABNORMAL HIGH (ref 70–99)
Glucose-Capillary: 253 mg/dL — ABNORMAL HIGH (ref 70–99)
Glucose-Capillary: 227 mg/dL — ABNORMAL HIGH (ref 70–99)

## 2013-02-04 LAB — BLOOD GAS, ARTERIAL
Acid-Base Excess: 1.8 mmol/L (ref 0.0–2.0)
Bicarbonate: 26 mEq/L — ABNORMAL HIGH (ref 20.0–24.0)
Drawn by: 257701
FIO2: 0.21 %
O2 Saturation: 95.6 %
Patient temperature: 98.6
TCO2: 27.2 mmol/L (ref 0–100)
pCO2 arterial: 41.7 mmHg (ref 35.0–45.0)
pH, Arterial: 7.41 (ref 7.350–7.450)
pO2, Arterial: 73.9 mmHg — ABNORMAL LOW (ref 80.0–100.0)

## 2013-02-04 LAB — HEMOGLOBIN A1C
Hgb A1c MFr Bld: 10.2 % — ABNORMAL HIGH (ref <5.7)
Mean Plasma Glucose: 246 mg/dL — ABNORMAL HIGH (ref <117)

## 2013-02-04 LAB — COMPREHENSIVE METABOLIC PANEL
ALT: 34 U/L (ref 0–53)
AST: 24 U/L (ref 0–37)
Albumin: 3.7 g/dL (ref 3.5–5.2)
Alkaline Phosphatase: 69 U/L (ref 39–117)
BUN: 23 mg/dL (ref 6–23)
CO2: 25 mEq/L (ref 19–32)
Calcium: 10.3 mg/dL (ref 8.4–10.5)
Chloride: 94 mEq/L — ABNORMAL LOW (ref 96–112)
Creatinine, Ser: 0.93 mg/dL (ref 0.50–1.35)
GFR calc Af Amer: >90 mL/min (ref >90)
GFR calc non Af Amer: >90 mL/min (ref >90)
Glucose, Bld: 356 mg/dL — ABNORMAL HIGH (ref 70–99)
Potassium: 4.4 mEq/L (ref 3.5–5.1)
Sodium: 132 mEq/L — ABNORMAL LOW (ref 135–145)
Total Bilirubin: 0.4 mg/dL (ref 0.3–1.2)
Total Protein: 7.9 g/dL (ref 6.0–8.3)

## 2013-02-04 LAB — URINALYSIS, ROUTINE W REFLEX MICROSCOPIC
Bilirubin Urine: NEGATIVE
Glucose, UA: >1000 mg/dL — AB
Hgb urine dipstick: NEGATIVE
Ketones, ur: NEGATIVE mg/dL
Leukocytes, UA: NEGATIVE
Nitrite: NEGATIVE
Protein, ur: 30 mg/dL — AB
Specific Gravity, Urine: 1.029 (ref 1.005–1.030)
Urobilinogen, UA: 0.2 mg/dL (ref 0.0–1.0)
pH: 5.5 (ref 5.0–8.0)

## 2013-02-04 LAB — URINE MICROSCOPIC-ADD ON

## 2013-02-04 LAB — SURGICAL PCR SCREEN
MRSA, PCR: NEGATIVE
Staphylococcus aureus: POSITIVE — AB

## 2013-02-04 MED ORDER — DEXTROSE 5 % IV SOLN
30.0000 ug/min | INTRAVENOUS | Status: AC
  Administered 2013-02-05: 15 ug/min via INTRAVENOUS
  Filled 2013-02-04: qty 2

## 2013-02-04 MED ORDER — DEXTROSE 5 % IV SOLN
1.5000 g | INTRAVENOUS | Status: AC
  Administered 2013-02-05: 1.5 g via INTRAVENOUS
  Administered 2013-02-05: .75 g via INTRAVENOUS
  Filled 2013-02-04: qty 1.5

## 2013-02-04 MED ORDER — METOPROLOL TARTRATE 12.5 MG HALF TABLET
12.5000 mg | ORAL_TABLET | Freq: Once | ORAL | Status: AC
  Administered 2013-02-05: 06:00:00 12.5 mg via ORAL
  Filled 2013-02-04: qty 1

## 2013-02-04 MED ORDER — ALBUTEROL SULFATE (5 MG/ML) 0.5% IN NEBU
2.5000 mg | INHALATION_SOLUTION | Freq: Once | RESPIRATORY_TRACT | Status: AC
  Administered 2013-02-04: 12:00:00 2.5 mg via RESPIRATORY_TRACT

## 2013-02-04 MED ORDER — INSULIN REGULAR HUMAN (CONC) 500 UNIT/ML ~~LOC~~ SOLN
40.0000 [IU] | Freq: Every day | SUBCUTANEOUS | Status: DC
  Filled 2013-02-04: qty 20

## 2013-02-04 MED ORDER — NITROGLYCERIN IN D5W 200-5 MCG/ML-% IV SOLN
2.0000 ug/min | INTRAVENOUS | Status: AC
  Administered 2013-02-05: 5 ug/min via INTRAVENOUS
  Filled 2013-02-04: qty 250

## 2013-02-04 MED ORDER — TEMAZEPAM 15 MG PO CAPS: 15.0000 mg | ORAL_CAPSULE | Freq: Once | ORAL | Status: AC | PRN

## 2013-02-04 MED ORDER — DIAZEPAM 5 MG PO TABS
5.0000 mg | ORAL_TABLET | Freq: Once | ORAL | Status: AC
  Administered 2013-02-05: 5 mg via ORAL
  Filled 2013-02-04: qty 1

## 2013-02-04 MED ORDER — DEXTROSE 5 % IV SOLN
750.0000 mg | INTRAVENOUS | Status: DC
  Filled 2013-02-04: qty 750

## 2013-02-04 MED ORDER — BISACODYL 5 MG PO TBEC
5.0000 mg | DELAYED_RELEASE_TABLET | Freq: Once | ORAL | Status: DC
  Filled 2013-02-04: qty 1

## 2013-02-04 MED ORDER — PLASMA-LYTE 148 IV SOLN
INTRAVENOUS | Status: AC
  Administered 2013-02-05: 09:00:00
  Filled 2013-02-04: qty 2.5

## 2013-02-04 MED ORDER — EPINEPHRINE HCL 1 MG/ML IJ SOLN
0.5000 ug/min | INTRAVENOUS | Status: DC
  Filled 2013-02-04: qty 4

## 2013-02-04 MED ORDER — CHLORHEXIDINE GLUCONATE 4 % EX LIQD
60.0000 mL | Freq: Once | CUTANEOUS | Status: AC
  Administered 2013-02-04: 4 via TOPICAL
  Filled 2013-02-04: qty 120

## 2013-02-04 MED ORDER — CHLORHEXIDINE GLUCONATE 4 % EX LIQD
60.0000 mL | Freq: Once | CUTANEOUS | Status: AC
  Administered 2013-02-05: 4 via TOPICAL

## 2013-02-04 MED ORDER — DEXMEDETOMIDINE HCL IN NACL 400 MCG/100ML IV SOLN
0.1000 ug/kg/h | INTRAVENOUS | Status: AC
  Administered 2013-02-05: 0.2 ug/kg/h via INTRAVENOUS
  Filled 2013-02-04: qty 100

## 2013-02-04 MED ORDER — PNEUMOCOCCAL VAC POLYVALENT 25 MCG/0.5ML IJ INJ
0.5000 mL | INJECTION | INTRAMUSCULAR | Status: DC
  Filled 2013-02-04: qty 0.5

## 2013-02-04 MED ORDER — VANCOMYCIN HCL 10 G IV SOLR
1250.0000 mg | INTRAVENOUS | Status: AC
  Administered 2013-02-05: 1250 mg via INTRAVENOUS
  Filled 2013-02-04: qty 1250

## 2013-02-04 MED ORDER — INFLUENZA VAC SPLIT QUAD 0.5 ML IM SUSP
0.5000 mL | INTRAMUSCULAR | Status: DC
  Filled 2013-02-04: qty 0.5

## 2013-02-04 MED ORDER — CHLORHEXIDINE GLUCONATE CLOTH 2 % EX PADS: 6.0000 | MEDICATED_PAD | Freq: Every day | CUTANEOUS | Status: DC

## 2013-02-04 MED ORDER — MUPIROCIN 2 % EX OINT
1.0000 "application " | TOPICAL_OINTMENT | Freq: Two times a day (BID) | CUTANEOUS | Status: AC
  Administered 2013-02-04 – 2013-02-09 (×9): 1 via NASAL
  Filled 2013-02-04 (×2): qty 22

## 2013-02-04 MED ORDER — SODIUM CHLORIDE 0.9 % IV SOLN
INTRAVENOUS | Status: AC
  Administered 2013-02-05: 70 mL/h via INTRAVENOUS
  Filled 2013-02-04: qty 40

## 2013-02-04 MED ORDER — INSULIN REGULAR HUMAN (CONC) 500 UNIT/ML ~~LOC~~ SOLN
100.0000 [IU] | Freq: Every day | SUBCUTANEOUS | Status: DC
  Filled 2013-02-04 (×2): qty 20

## 2013-02-04 MED ORDER — POTASSIUM CHLORIDE 2 MEQ/ML IV SOLN
80.0000 meq | INTRAVENOUS | Status: DC
  Filled 2013-02-04: qty 40

## 2013-02-04 MED ORDER — INSULIN REGULAR HUMAN (CONC) 500 UNIT/ML ~~LOC~~ SOLN
200.0000 [IU] | Freq: Every day | SUBCUTANEOUS | Status: DC
  Administered 2013-02-04: 18:00:00 200 [IU] via SUBCUTANEOUS
  Filled 2013-02-04 (×3): qty 20

## 2013-02-04 MED ORDER — INSULIN REGULAR HUMAN (CONC) 500 UNIT/ML ~~LOC~~ SOLN
20.0000 [IU] | Freq: Every day | SUBCUTANEOUS | Status: DC
  Filled 2013-02-04: qty 20

## 2013-02-04 MED ORDER — DOPAMINE-DEXTROSE 3.2-5 MG/ML-% IV SOLN
2.0000 ug/kg/min | INTRAVENOUS | Status: AC
  Administered 2013-02-05: 3 ug/kg/min via INTRAVENOUS
  Filled 2013-02-04: qty 250

## 2013-02-04 MED ORDER — SODIUM CHLORIDE 0.9 % IV SOLN
INTRAVENOUS | Status: AC
  Administered 2013-02-05: 2.8 [IU]/h via INTRAVENOUS
  Filled 2013-02-04: qty 1

## 2013-02-04 MED ORDER — MAGNESIUM SULFATE 50 % IJ SOLN
40.0000 meq | INTRAMUSCULAR | Status: DC
  Filled 2013-02-04: qty 10

## 2013-02-04 MED ORDER — HEPARIN SODIUM (PORCINE) 1000 UNIT/ML IJ SOLN
INTRAMUSCULAR | Status: DC
  Filled 2013-02-04: qty 30

## 2013-02-04 NOTE — Consult Note (Signed)
[address]       [place] [address]             [phone]        TIELER [facility]NOY[facility] Medical Record #[id number] Date of Birth: [birth year]  Referring: No ref. provider found Primary Care: Eber Hong, MD  Chief Complaint:   No chief complaint on file.  chest pains, accelerating symptoms  History of Present Illness:     Patient examined, coronary angiograms and medical record reviewed. 47 year old Caucasian male nonsmoker with severe diabetes and hyperlipidemia followed carefully by his cardiologist. 2 years ago a Myoview was nondiagnostic or ischemia. Because of recent onset of exertional chest pains increasing in intensity and frequency the patient underwent cardiac catheterization. This demonstrates severe three-vessel CAD with preserved LV function. A 2-D echocardiogram is pending. The patient is currently hospitalized stable following right radial artery catheterization.   Current Activity/ Functional Status: Patient works full-time in Designer, fashion/clothing   Zubrod Score: At the time of surgery this patient's most appropriate activity status/level should be described as: []  Normal activity, no symptoms [x]  Symptoms, fully ambulatory []  Symptoms, in bed less than or equal to 50% of the time []  Symptoms, in bed greater than 50% of the time but less than 100% []  Bedridden []  Moribund  Past Medical History  Diagnosis Date  . Pancreatitis     occasional - last episode 06/2011  . Hyperlipidemia   . Fatty liver disease, nonalcoholic 06/16/2011  . Anemia     occasional - no problems currently(10/02/2011)  . Diabetes mellitus     IDDM  . Bilateral renal cysts 06/16/2011    states no known problems  . Non Hodgkin's lymphoma 1991  . Hypertension     states no dx. of HTN, takes med. to protect kidneys due to DM  . Splenomegaly, congestive, chronic   . Stuffy and runny nose 10/02/2011    yellow drainage from nose  . Loose body in knee 09/2011   loose bodies left knee  . Lateral meniscus tear 09/2011    left  . Blood transfusion without reported diagnosis   . Coronary artery calcification seen on CAT scan   . Chest pain     positive Myoview stress test    Past Surgical History  Procedure Laterality Date  . Nasal septum surgery    . Tonsillectomy    . Elbow surgery    . Hernia repair      inguinal   . Tibia bone biopsy  x 3    left  . Knee arthroscopy  10/09/2011    Procedure: ARTHROSCOPY KNEE;  Surgeon: [name], MD;  Location: [facility];  Service: Orthopedics;  Laterality: Left;    History  Smoking status  . Never Smoker   Smokeless tobacco  . Never Used    History  Alcohol Use No    History   Social History  . Marital Status: Married    Spouse Name: [name]    Number of Children: 1  . 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 Smoking status: Never Smoker   . Smokeless tobacco: Never Used  . Alcohol Use: No  . Drug Use: No  . Sexual Activity: Yes   Other Topics Concern  . Not on file   Social History Narrative   Adopted, so no pertinent FH.  Married.  Lives with wife in [place]  Independent of ADLs and ambulation.    No Known Allergies  Current Facility-Administered Medications  Medication Dose Route Frequency Provider Last Rate Last Dose  . acetaminophen (TYLENOL) tablet 650 mg  650 mg Oral Q4H PRN Runell Gess, MD      . amLODipine (NORVASC) tablet 5 mg  5 mg Oral Daily Runell Gess, MD   5 mg at 02/03/13 2113  . aspirin EC tablet 81 mg  81 mg Oral Daily Runell Gess, MD      . fenofibrate tablet 160 mg  160 mg Oral q1800 Runell Gess, MD   160 mg at 02/03/13 2114  . [START ON 02/05/2013] influenza vac split quadrivalent PF (FLUARIX) injection 0.5 mL  0.5 mL Intramuscular Tomorrow-1000 Runell Gess, MD      . insulin aspart (novoLOG) injection 0-15 Units  0-15 Units  Subcutaneous TID WC Ardis Rowan, MD   15 Units at 02/04/13 [phone]  . insulin aspart (novoLOG) injection 0-5 Units  0-5 Units Subcutaneous QHS Ardis Rowan, MD   5 Units at 02/03/13 2302  . insulin NPH (HUMULIN N,NOVOLIN N) injection 15 Units  15 Units Subcutaneous QHS Brittainy Simmons, PA-C      . [START ON 02/05/2013] insulin regular human CONCENTRATED (HUMULIN R) 500 UNIT/ML injection 20 Units  20 Units Subcutaneous Q breakfast Runell Gess, MD       And  . insulin regular human CONCENTRATED (HUMULIN R) 500 UNIT/ML injection 40 Units  40 Units Subcutaneous Q supper Runell Gess, MD      . isosorbide mononitrate (IMDUR) 24 hr tablet 30 mg  30 mg Oral Daily Runell Gess, MD   30 mg at 02/03/13 2113  . metoprolol succinate (TOPROL-XL) 24 hr tablet 25 mg  25 mg Oral Daily Runell Gess, MD   25 mg at 02/03/13 2113  . morphine 2 MG/ML injection 2 mg  2 mg Intravenous Q1H PRN Runell Gess, MD      . ondansetron [facility]) injection 4 mg  4 mg Intravenous Q6H PRN Runell Gess, MD      . Melene Muller ON 02/05/2013] pneumococcal 23 valent vaccine (PNU-IMMUNE) injection 0.5 mL  0.5 mL Intramuscular Tomorrow-1000 Runell Gess, MD      . ramipril (ALTACE) capsule 2.5 mg  2.5 mg Oral Daily Runell Gess, MD   2.5 mg at 02/03/13 2113    Prescriptions prior to admission  Medication Sig Dispense Refill  . amLODipine (NORVASC) 5 MG tablet Take 1 tablet (5 mg total) by mouth daily.  30 tablet  6  . aspirin EC 81 MG tablet Take 1 tablet (81 mg total) by mouth daily.  30 tablet  6  . fenofibrate 160 MG tablet Take 160 mg by mouth daily. PM      . insulin NPH (HUMULIN N,NOVOLIN N) 100 UNIT/ML injection Inject 15 Units into the skin at bedtime.       . insulin regular human CONCENTRATED (HUMULIN R) 500 UNIT/ML SOLN injection Inject 20-40 Units into the skin 2 (two) times daily with a meal. 20 units every morning and 40 units at bedtime      . isosorbide mononitrate (IMDUR) 30 MG 24 hr  tablet Take 1 tablet (30 mg total) by mouth daily.  30 tablet  6  . metFORMIN (GLUCOPHAGE) 1000 MG tablet Take 1,000 mg by mouth 2 (two) times daily with a meal.      .  metoprolol succinate (TOPROL XL) 25 MG 24 hr tablet Take 1 tablet (25 mg total) by mouth daily.  30 tablet  6  . ramipril (ALTACE) 2.5 MG capsule Take 2.5 mg by mouth daily.      . Insulin Syringe-Needle U-100 (INSULIN SYRINGE 1CC/31GX5/16") 31G X 5/16" 1 ML MISC       . ONE TOUCH ULTRA TEST test strip         Family History  Problem Relation Age of Onset  . Adopted: Yes     Review of Systems:  Non-Hodgkin's lymphoma of left leg with pathologic fracture and previous radiation 10 years ago. He has had intermittent flareups of cellulitis in the left leg following radiation.  Severe and probable familial hyperlipidemia now improved with weight loss diet and fenofibrate. Last episode of pancreatitis related to his hyperlipidemia was over one year ago.  Right-hand dominant  Recent nasal congestion and nonproductive cough, improved over the past 48 hours without associated fever or myalgias.   Well-managed diabetes with A1c previously reported at 7.2    Cardiac Review of Systems: Y or N  Chest Pain [  Y.  ]  Resting SOB [ N.  ] Exertional SOB  [ Y. ]  Orthopnea [N.  ]   Pedal Edema [ Y. left greater than right leg.  ]    Palpitations [N.  ] Syncope  [  ]   Presyncope [   ]  General Review of Systems: [Y] = yes [  ]=no Constitional: recent weight change [ N. ]; anorexia [  ]; fatigue [  ]; nausea [  ]; night sweats [  ]; fever [  ]; or chills [  ]                                                               Dental: poor dentition[  ]; Last Dentist visit: Annual   Eye : blurred vision [  ]; diplopia [   ]; vision changes [  ];  Amaurosis fugax[  ]; Resp: cough [  ];  wheezing[  ];  hemoptysis[  ]; shortness of breath[  ]; paroxysmal nocturnal dyspnea[  ]; dyspnea on exertion[  ]; or orthopnea[  ];  GI:  gallstones[  ],  vomiting[  ];  dysphagia[  ]; melena[  ];  hematochezia [  ]; heartburn[  ];   Hx of  Colonoscopy[  ]; GU: kidney stones [  ]; hematuria[  ];   dysuria [  ];  nocturia[  ];  history of     obstruction [  ]; urinary frequency [  ]             Skin: rash, swelling[  ];, hair loss[  ];  peripheral edema[  ];  or itching[  ]; Musculosketetal: myalgias[  ];  joint swelling[  ];  joint erythema[  ];  joint pain[  ];  back pain[  ];  Heme/Lymph: bruising[  ];  bleeding[  ];  anemia[  ];  Neuro: TIA[  ];  headaches[  ];  stroke[  ];  vertigo[  ];  seizures[  ];   paresthesias[  ];  difficulty walking[  ];  Psych:depression[  ]; anxiety[  ];  Endocrine: diabetes[ Y. ];  thyroid  dysfunction[  ];  Immunizations: Flu [  ]; Pneumococcal[  ];  Other:  Physical Exam: BP 126/76  Pulse 83  Temp(Src) 97.8 F (36.6 C) (Oral)  Resp 20  Ht 5\' 11"  (1.803 m)  Wt 208 lb 1.8 oz (94.4 kg)  BMI 29.04 kg/m2  SpO2 97%  Gen. appearance-middle-aged Caucasian male no acute distress HEENT normocephalic pupils equal dentition good Neck without JVD mass or carotid bruit Thorax without deformity or tenderness, breath sounds clear bilaterally Cardiac regular rhythm without murmur or gallop Abdomen soft nontender without pulsatile mass  Extremities with mild pedal edema, no clubbing cyanosis or tenderness. Dressing over right radial artery from cardiac  angiography site Vascular palpable pulses in all extremities Neurologic no focal motor deficit   Diagnostic Studies & Laboratory data:     Recent Radiology Findings:   No results found.    Recent Lab Findings: Lab Results  Component Value Date   WBC 5.3 02/02/2013   HGB 13.3 02/02/2013   HCT 39.6 02/02/2013   PLT 104* 02/02/2013   GLUCOSE 356* 02/04/2013   CHOL 142 06/17/2011   TRIG 709* 06/17/2011   HDL 10* 06/17/2011   LDLCALC UNABLE TO CALCULATE IF TRIGLYCERIDE OVER 400 mg/dL 1/61/0960   ALT 34 45/40/9811   AST 24 02/04/2013   NA 132* 02/04/2013   K  4.4 02/04/2013   CL 94* 02/04/2013   CREATININE 0.93 02/04/2013   BUN 23 02/04/2013   CO2 25 02/04/2013   TSH 1.291 09/28/2009   INR 0.99 02/02/2013   HGBA1C 9.2* 06/16/2011      Assessment / Plan:     47 year old diabetic with hyperlipidemia and tolerating anginal pattern severe multivessel CAD by cardiac catheterization. He would benefit from multivessel bypass grafting in arterial grafts with left-sided system. I've discussed CABG in detail the patient and his wife including indications benefits alternatives and expected postoperative recovery. I also reviewed potential risks including stroke, bleeding, MI, infection, and death. He understands and agrees to proceed with surgery would be scheduled for November 20 at [place].      @ME1 @ 02/04/2013 11:47 AM

## 2013-02-04 NOTE — Progress Notes (Signed)
CARDIAC REHAB PHASE I   PRE:  Rate/Rhythm: 84SR  BP:  Supine: 126/76  Sitting:   Standing:    SaO2: 95%RA  MODE:  Ambulation: 1000 ft   POST:  Rate/Rhythm: 94SR  BP:  Supine:   Sitting: 137/87  Standing:    SaO2: 97%RA 0750-0835 Pt walked 1000 ft independently with steady gait. No CP. Tolerated well. Pt has seen pre op video and has OHS booklet. Discussed sternal precautions and demonstrated getting up and sitting without use of arms. Will follow up after surgery. Pt does not have IS yet but has had in past and understands purpose.  Pt understands how important ambulation is after surgery.   Luetta Nutting, RN BSN  02/04/2013 8:29 AM

## 2013-02-04 NOTE — Anesthesia Preprocedure Evaluation (Addendum)
Anesthesia Evaluation  Patient identified by MRN, date of birth, ID band Patient awake    Reviewed: Allergy & Precautions, H&P , NPO status , Patient's Chart, lab work & pertinent test results  Airway       Dental   Pulmonary          Cardiovascular hypertension, + CAD     Neuro/Psych    GI/Hepatic   Endo/Other  diabetes, Type 2, Insulin Dependent  Renal/GU Renal disease     Musculoskeletal   Abdominal   Peds  Hematology  (+) anemia ,   Anesthesia Other Findings   Reproductive/Obstetrics                         Anesthesia Physical Anesthesia Plan  ASA: III  Anesthesia Plan: General   Post-op Pain Management:    Induction: Intravenous  Airway Management Planned: Oral ETT  Additional Equipment: Arterial line, CVP and PA Cath  Intra-op Plan:   Post-operative Plan: Post-operative intubation/ventilation  Informed Consent: I have reviewed the patients History and Physical, chart, labs and discussed the procedure including the risks, benefits and alternatives for the proposed anesthesia with the patient or authorized representative who has indicated his/her understanding and acceptance.     Plan Discussed with: CRNA, Anesthesiologist and Surgeon  Anesthesia Plan Comments:         Anesthesia Quick Evaluation

## 2013-02-04 NOTE — Progress Notes (Signed)
Came to see patient on rounds -- was just taken for Echo.  My understanding was that the plan was for him to go for CABG tomorrow.  Will return to see him after Echo complete.  Marykay Lex, MD

## 2013-02-04 NOTE — Progress Notes (Addendum)
Inpatient Diabetes Program Recommendations  AACE/ADA: New Consensus Statement on Inpatient Glycemic Control (2013)  Target Ranges:  Prepandial:   less than 140 mg/dL      Peak postprandial:   less than 180 mg/dL (1-2 hours)      Critically ill patients:  140 - 180 mg/dL     Results for Juan [name], Juan [name] (MRN [id number]) as of 02/04/2013 12:08  Ref. Range 02/03/2013 13:16 02/03/2013 16:13 02/03/2013 22:05  Glucose-Capillary Latest Range: 70-99 mg/dL 086 (H) 578 (H) 469 (H)    Results for Juan [name], Juan [name] (MRN [id number]) as of 02/04/2013 12:08  Ref. Range 02/04/2013 08:02  Glucose-Capillary Latest Range: 70-99 mg/dL 244 (H)    **Patient with CAD.  Plans for CABG tomorrow by  TCTS.    **Patient has history of Type 2 DM and takes the following insulin at home per records: NPH insulin- 15 units QHS U-500 insulin- 20 units in the AM with breakfast and 40 units with supper Metformin 1000 mg bid  **Per patient's wife, patient sees Dr. [name] for his DM management [facility]).  **Noted patient did bring his U-500 insulin to the hospital.  Pharmacy to store U-500 insulin and send doses to RN when needed per policy.  Patient will get 1st dose of U-500 insulin tonight with supper.  Noted that patient will be NPO after midnight.  Patient will be managed with IV insulin drip per the GlucoStabilizer per TCTS tomorrow for surgery.   Will follow while inpatient. Ambrose Finland RN, MSN, CDE Diabetes Coordinator Inpatient Diabetes Program Team Pager: [phone] (8a-10p)

## 2013-02-04 NOTE — Progress Notes (Signed)
  Echocardiogram 2D Echocardiogram has been performed.  Juan [name] 02/04/2013, 10:35 AM

## 2013-02-04 NOTE — Progress Notes (Signed)
VASCULAR LAB PRELIMINARY  PRELIMINARY  PRELIMINARY  PRELIMINARY  Pre-op Cardiac Surgery  Carotid Findings:  Bilateral:  1-39% ICA stenosis.  Vertebral artery flow is antegrade.      Upper Extremity Right Left  Brachial Pressures Triphasic   123 Triphasic   124  Radial Waveforms Triphasic  Triphasic   Ulnar Waveforms Triphasic  Triphasic   Palmar Arch (Allen's Test) Within normal limits  Within normal limits      Lower  Extremity Right Left  Dorsalis Pedis 137  Triphasic  142  Triphasic   Anterior Tibial    Posterior Tibial 153   Triphasic  146   Triphasic   Ankle/Brachial Indices 1.23  1.18    [name], RVT 02/04/2013, 11:49 AM

## 2013-02-04 NOTE — Progress Notes (Signed)
Subjective: No complaints.  Wrist is fine.  Objective: Vital signs in last 24 hours: Temp:  [97.8 F (36.6 C)-98.5 F (36.9 C)] 97.8 F (36.6 C) (11/19 0803) Pulse Rate:  [76-103] 83 (11/19 0803) Resp:  [18-20] 20 (11/19 0803) BP: (104-144)/(64-86) 126/76 mmHg (11/19 0803) SpO2:  [94 %-99 %] 97 % (11/19 0803) Weight:  [208 lb 1.8 oz (94.4 kg)-211 lb (95.709 kg)] 208 lb 1.8 oz (94.4 kg) (11/19 0023) Last BM Date: 02/02/13  Intake/Output from previous day: 11/18 0701 - 11/19 0700 In: 1977.5 [P.O.:1200; I.V.:777.5] Out: 3000 [Urine:3000] Intake/Output this shift:    Medications Current Facility-Administered Medications  Medication Dose Route Frequency Provider Last Rate Last Dose  . acetaminophen (TYLENOL) tablet 650 mg  650 mg Oral Q4H PRN Runell Gess, MD      . amLODipine (NORVASC) tablet 5 mg  5 mg Oral Daily Runell Gess, MD   5 mg at 02/03/13 2113  . aspirin EC tablet 81 mg  81 mg Oral Daily Runell Gess, MD      . fenofibrate tablet 160 mg  160 mg Oral q1800 Runell Gess, MD   160 mg at 02/03/13 2114  . [START ON 02/05/2013] influenza vac split quadrivalent PF (FLUARIX) injection 0.5 mL  0.5 mL Intramuscular Tomorrow-1000 Runell Gess, MD      . insulin aspart (novoLOG) injection 0-15 Units  0-15 Units Subcutaneous TID WC Ardis Rowan, MD   15 Units at 02/04/13 [phone]  . insulin aspart (novoLOG) injection 0-5 Units  0-5 Units Subcutaneous QHS Ardis Rowan, MD   5 Units at 02/03/13 2302  . insulin NPH (HUMULIN N,NOVOLIN N) injection 15 Units  15 Units Subcutaneous QHS Brittainy Simmons, PA-C      . insulin regular human CONCENTRATED (HUMULIN R) 500 UNIT/ML injection 20-40 Units  20-40 Units Subcutaneous BID WC Runell Gess, MD      . isosorbide mononitrate (IMDUR) 24 hr tablet 30 mg  30 mg Oral Daily Runell Gess, MD   30 mg at 02/03/13 2113  . metoprolol succinate (TOPROL-XL) 24 hr tablet 25 mg  25 mg Oral Daily Runell Gess, MD   25  mg at 02/03/13 2113  . morphine 2 MG/ML injection 2 mg  2 mg Intravenous Q1H PRN Runell Gess, MD      . ondansetron [facility]) injection 4 mg  4 mg Intravenous Q6H PRN Runell Gess, MD      . Melene Muller ON 02/05/2013] pneumococcal 23 valent vaccine (PNU-IMMUNE) injection 0.5 mL  0.5 mL Intramuscular Tomorrow-1000 Runell Gess, MD      . ramipril (ALTACE) capsule 2.5 mg  2.5 mg Oral Daily Runell Gess, MD   2.5 mg at 02/03/13 2113    PE: General appearance: alert, cooperative and no distress Lungs: clear to auscultation bilaterally Heart: regular rate and rhythm, S1, S2 normal, no murmur, click, rub or gallop Extremities: No LEE Pulses: 2+ and symmetric Skin: Warm and dry.  Right wrist without hematoma or echymosis. Neurologic: Grossly normal  Lab Results:   Recent Labs  02/02/13 1116  WBC 5.3  HGB 13.3  HCT 39.6  PLT 104*   BMET  Recent Labs  02/02/13 1116  NA 131*  K 5.0  CL 96  CO2 25  GLUCOSE 327*  BUN 27*  CREATININE 1.01  CALCIUM 10.6*   PT/INR  Recent Labs  02/02/13 1116  LABPROT 13.1  INR 0.99    Assessment/Plan  Principal Problem:   CAD (coronary artery disease) Active Problems:   Hyperlipidemia   DM2 (diabetes mellitus, type 2)   Chest pain   Hyponatremia  Plan:  SP coronary angiography revealing three vessel disease.  TCTS consult pending.  BP and HR stable.  Labs, echo pending.  Poorly controlled DM.  Getting humulin.  ASA, amlodipine, toprol, imdur, ramipril.   LOS: 1 day    HAGER, BRYAN 02/04/2013 8:43 AM  I have seen and evaluated the patient this PM along with Wilburt Finlay, PA. I agree with his findings, examination as well as impression recommendations.  In the interim between Mr. Leron Croak & ONGEXB, the pt was seen in consultation by Dr. Donata Clay -- plan CABG in AM  Feels OK today -- all ?s answered re: CABG in AM with Dr. Maren Beach.  Appreciate the promp c/s.  BP & HR stable. Is on fenofibrate & not statin. - BB  ordered.    Marykay Lex, M.D., M.S. [facility] [address] [place], [place]  [address]  [phone] Pager # [phone] 02/04/2013 5:19 PM

## 2013-02-05 ENCOUNTER — Inpatient Hospital Stay (HOSPITAL_COMMUNITY): Payer: Managed Care, Other (non HMO) | Admitting: Anesthesiology

## 2013-02-05 ENCOUNTER — Encounter (HOSPITAL_COMMUNITY): Payer: Self-pay | Admitting: Anesthesiology

## 2013-02-05 ENCOUNTER — Inpatient Hospital Stay (HOSPITAL_COMMUNITY): Payer: Managed Care, Other (non HMO)

## 2013-02-05 ENCOUNTER — Encounter (HOSPITAL_COMMUNITY)
Admission: RE | Disposition: A | Discharge status: Home / Self Care | Payer: Self-pay | Source: Ambulatory Visit | Attending: Cardiothoracic Surgery

## 2013-02-05 ENCOUNTER — Encounter (HOSPITAL_COMMUNITY): Payer: Managed Care, Other (non HMO) | Admitting: Anesthesiology

## 2013-02-05 DIAGNOSIS — I2510 Atherosclerotic heart disease of native coronary artery without angina pectoris: Secondary | ICD-10-CM

## 2013-02-05 HISTORY — PX: CORONARY ARTERY BYPASS GRAFT: SHX141

## 2013-02-05 HISTORY — PX: INTRAOPERATIVE TRANSESOPHAGEAL ECHOCARDIOGRAM: SHX5062

## 2013-02-05 HISTORY — PX: RADIAL ARTERY HARVEST: SHX5067

## 2013-02-05 LAB — POCT I-STAT 4, (NA,K, GLUC, HGB,HCT)
Glucose, Bld: 152 mg/dL — ABNORMAL HIGH (ref 70–99)
Glucose, Bld: 186 mg/dL — ABNORMAL HIGH (ref 70–99)
Glucose, Bld: 205 mg/dL — ABNORMAL HIGH (ref 70–99)
Glucose, Bld: 268 mg/dL — ABNORMAL HIGH (ref 70–99)
Glucose, Bld: 183 mg/dL — ABNORMAL HIGH (ref 70–99)
HCT: 35 % — ABNORMAL LOW (ref 39.0–52.0)
HCT: 30 % — ABNORMAL LOW (ref 39.0–52.0)
HCT: 28 % — ABNORMAL LOW (ref 39.0–52.0)
HCT: 31 % — ABNORMAL LOW (ref 39.0–52.0)
HCT: 32 % — ABNORMAL LOW (ref 39.0–52.0)
Hemoglobin: 11.9 g/dL — ABNORMAL LOW (ref 13.0–17.0)
Hemoglobin: 11.9 g/dL — ABNORMAL LOW (ref 13.0–17.0)
Hemoglobin: 10.2 g/dL — ABNORMAL LOW (ref 13.0–17.0)
Hemoglobin: 10.9 g/dL — ABNORMAL LOW (ref 13.0–17.0)
Potassium: 4 mEq/L (ref 3.5–5.1)
Potassium: 3.9 mEq/L (ref 3.5–5.1)
Potassium: 4.6 mEq/L (ref 3.5–5.1)
Potassium: 4.6 mEq/L (ref 3.5–5.1)
Potassium: 4.6 mEq/L (ref 3.5–5.1)
Potassium: 4.3 mEq/L (ref 3.5–5.1)
Sodium: 136 mEq/L (ref 135–145)
Sodium: 135 mEq/L (ref 135–145)
Sodium: 136 mEq/L (ref 135–145)

## 2013-02-05 LAB — HEMOGLOBIN AND HEMATOCRIT, BLOOD
HCT: 29 % — ABNORMAL LOW (ref 39.0–52.0)
Hemoglobin: 10.3 g/dL — ABNORMAL LOW (ref 13.0–17.0)

## 2013-02-05 LAB — TYPE AND SCREEN
ABO/RH(D): AB NEG
Antibody Screen: POSITIVE

## 2013-02-05 LAB — POCT I-STAT 3, ART BLOOD GAS (G3+)
Acid-base deficit: 4 mmol/L — ABNORMAL HIGH (ref 0.0–2.0)
Acid-base deficit: 1 mmol/L (ref 0.0–2.0)
Acid-base deficit: 1 mmol/L (ref 0.0–2.0)
Acid-base deficit: 1 mmol/L (ref 0.0–2.0)
Bicarbonate: 25.9 mEq/L — ABNORMAL HIGH (ref 20.0–24.0)
Bicarbonate: 23.7 mEq/L (ref 20.0–24.0)
O2 Saturation: 100 %
O2 Saturation: 86 %
O2 Saturation: 96 %
O2 Saturation: 97 %
Patient temperature: 37.3
Patient temperature: 38.6
Patient temperature: 38.4
TCO2: 27 mmol/L (ref 0–100)
TCO2: 25 mmol/L (ref 0–100)
TCO2: 27 mmol/L (ref 0–100)
TCO2: 25 mmol/L (ref 0–100)
TCO2: 25 mmol/L (ref 0–100)
pCO2 arterial: 50.6 mmHg — ABNORMAL HIGH (ref 35.0–45.0)
pCO2 arterial: 49.9 mmHg — ABNORMAL HIGH (ref 35.0–45.0)
pH, Arterial: 7.315 — ABNORMAL LOW (ref 7.350–7.450)
pH, Arterial: 7.274 — ABNORMAL LOW (ref 7.350–7.450)
pH, Arterial: 7.313 — ABNORMAL LOW (ref 7.350–7.450)
pH, Arterial: 7.364 (ref 7.350–7.450)
pO2, Arterial: 89 mmHg (ref 80.0–100.0)
pO2, Arterial: 98 mmHg (ref 80.0–100.0)

## 2013-02-05 LAB — CBC
HCT: 37.4 % — ABNORMAL LOW (ref 39.0–52.0)
HCT: 32.5 % — ABNORMAL LOW (ref 39.0–52.0)
HCT: 32.4 % — ABNORMAL LOW (ref 39.0–52.0)
Hemoglobin: 13.4 g/dL (ref 13.0–17.0)
Hemoglobin: 11.4 g/dL — ABNORMAL LOW (ref 13.0–17.0)
MCH: 27.4 pg (ref 26.0–34.0)
MCH: 26.3 pg (ref 26.0–34.0)
MCHC: 35.8 g/dL (ref 30.0–36.0)
MCHC: 34.8 g/dL (ref 30.0–36.0)
MCHC: 35.2 g/dL (ref 30.0–36.0)
MCV: 76.5 fL — ABNORMAL LOW (ref 78.0–100.0)
MCV: 75.6 fL — ABNORMAL LOW (ref 78.0–100.0)
MCV: 74.8 fL — ABNORMAL LOW (ref 78.0–100.0)
Platelets: 100 10*3/uL — ABNORMAL LOW (ref 150–400)
Platelets: 97 10*3/uL — ABNORMAL LOW (ref 150–400)
RBC: 4.89 MIL/uL (ref 4.22–5.81)
RBC: 4.3 MIL/uL (ref 4.22–5.81)
RBC: 4.33 MIL/uL (ref 4.22–5.81)
RDW: 14.3 % (ref 11.5–15.5)
RDW: 14.2 % (ref 11.5–15.5)
RDW: 14.6 % (ref 11.5–15.5)
WBC: 5.7 10*3/uL (ref 4.0–10.5)
WBC: 8.9 10*3/uL (ref 4.0–10.5)
WBC: 10.1 10*3/uL (ref 4.0–10.5)

## 2013-02-05 LAB — BASIC METABOLIC PANEL
BUN: 22 mg/dL (ref 6–23)
CO2: 23 mEq/L (ref 19–32)
Calcium: 10.1 mg/dL (ref 8.4–10.5)
Chloride: 98 mEq/L (ref 96–112)
Creatinine, Ser: 0.97 mg/dL (ref 0.50–1.35)
GFR calc Af Amer: >90 mL/min (ref >90)
GFR calc non Af Amer: >90 mL/min (ref >90)
Glucose, Bld: 81 mg/dL (ref 70–99)
Potassium: 3.5 mEq/L (ref 3.5–5.1)
Sodium: 135 mEq/L (ref 135–145)

## 2013-02-05 LAB — POCT I-STAT, CHEM 8
BUN: 19 mg/dL (ref 6–23)
Calcium, Ion: 1.41 mmol/L — ABNORMAL HIGH (ref 1.12–1.23)
Chloride: 106 mEq/L (ref 96–112)
Creatinine, Ser: 1.1 mg/dL (ref 0.50–1.35)
Glucose, Bld: 156 mg/dL — ABNORMAL HIGH (ref 70–99)
HCT: 35 % — ABNORMAL LOW (ref 39.0–52.0)
TCO2: 23 mmol/L (ref 0–100)

## 2013-02-05 LAB — CREATININE, SERUM
Creatinine, Ser: 0.99 mg/dL (ref 0.50–1.35)
GFR calc Af Amer: >90 mL/min (ref >90)
GFR calc non Af Amer: >90 mL/min (ref >90)

## 2013-02-05 LAB — GLUCOSE, CAPILLARY
Glucose-Capillary: 85 mg/dL (ref 70–99)
Glucose-Capillary: 109 mg/dL — ABNORMAL HIGH (ref 70–99)
Glucose-Capillary: 116 mg/dL — ABNORMAL HIGH (ref 70–99)
Glucose-Capillary: 116 mg/dL — ABNORMAL HIGH (ref 70–99)

## 2013-02-05 LAB — PLATELET COUNT: Platelets: 125 10*3/uL — ABNORMAL LOW (ref 150–400)

## 2013-02-05 LAB — PROTIME-INR
INR: 1.2 (ref 0.00–1.49)
Prothrombin Time: 14.9 seconds (ref 11.6–15.2)

## 2013-02-05 LAB — APTT: aPTT: 23 seconds — ABNORMAL LOW (ref 24–37)

## 2013-02-05 LAB — MAGNESIUM: Magnesium: 2.8 mg/dL — ABNORMAL HIGH (ref 1.5–2.5)

## 2013-02-05 SURGERY — CORONARY ARTERY BYPASS GRAFTING (CABG)
Anesthesia: General | Site: Chest | Wound class: Clean

## 2013-02-05 MED ORDER — FENTANYL CITRATE 0.05 MG/ML IJ SOLN
INTRAMUSCULAR | Status: DC | PRN
  Administered 2013-02-05: 250 ug via INTRAVENOUS
  Administered 2013-02-05: 500 ug via INTRAVENOUS
  Administered 2013-02-05 (×2): 250 ug via INTRAVENOUS

## 2013-02-05 MED ORDER — ACETAMINOPHEN 500 MG PO TABS
1000.0000 mg | ORAL_TABLET | Freq: Four times a day (QID) | ORAL | Status: DC
  Administered 2013-02-06 – 2013-02-07 (×6): 1000 mg via ORAL
  Filled 2013-02-05 (×10): qty 2

## 2013-02-05 MED ORDER — VECURONIUM BROMIDE 10 MG IV SOLR
INTRAVENOUS | Status: DC | PRN
  Administered 2013-02-05: 3 mg via INTRAVENOUS
  Administered 2013-02-05 (×3): 2 mg via INTRAVENOUS
  Administered 2013-02-05: 5 mg via INTRAVENOUS
  Administered 2013-02-05: 3 mg via INTRAVENOUS
  Administered 2013-02-05: 2 mg via INTRAVENOUS

## 2013-02-05 MED ORDER — ROCURONIUM BROMIDE 100 MG/10ML IV SOLN
INTRAVENOUS | Status: DC | PRN
  Administered 2013-02-05: 70 mg via INTRAVENOUS

## 2013-02-05 MED ORDER — MAGNESIUM SULFATE 40 MG/ML IJ SOLN
4.0000 g | Freq: Once | INTRAMUSCULAR | Status: AC
  Administered 2013-02-05: 4 g via INTRAVENOUS
  Filled 2013-02-05: qty 100

## 2013-02-05 MED ORDER — NITROGLYCERIN IN D5W 200-5 MCG/ML-% IV SOLN: 0.0000 ug/min | INTRAVENOUS | Status: DC

## 2013-02-05 MED ORDER — BISACODYL 5 MG PO TBEC
10.0000 mg | DELAYED_RELEASE_TABLET | Freq: Every day | ORAL | Status: DC
  Administered 2013-02-06: 10 mg via ORAL
  Filled 2013-02-05: qty 2

## 2013-02-05 MED ORDER — FENTANYL CITRATE 0.05 MG/ML IJ SOLN
INTRAMUSCULAR | Status: AC
  Administered 2013-02-05: 100 ug via INTRAVENOUS
  Filled 2013-02-05: qty 2

## 2013-02-05 MED ORDER — PROTAMINE SULFATE 10 MG/ML IV SOLN
INTRAVENOUS | Status: DC | PRN
  Administered 2013-02-05: 25 mg via INTRAVENOUS
  Administered 2013-02-05: 50 mg via INTRAVENOUS
  Administered 2013-02-05: 25 mg via INTRAVENOUS

## 2013-02-05 MED ORDER — SODIUM CHLORIDE 0.9 % IV SOLN: 250.0000 mL | INTRAVENOUS | Status: DC

## 2013-02-05 MED ORDER — MORPHINE SULFATE 2 MG/ML IJ SOLN
2.0000 mg | INTRAMUSCULAR | Status: DC | PRN
  Administered 2013-02-06: 4 mg via INTRAVENOUS
  Filled 2013-02-05: qty 2
  Filled 2013-02-05: qty 1

## 2013-02-05 MED ORDER — SODIUM CHLORIDE 0.9 % IJ SOLN
10.0000 mL | INTRAMUSCULAR | Status: DC | PRN
  Administered 2013-02-05: 10 mL

## 2013-02-05 MED ORDER — ACETAMINOPHEN 160 MG/5ML PO SOLN: 650.0000 mg | Freq: Once | ORAL | Status: AC

## 2013-02-05 MED ORDER — METOPROLOL TARTRATE 12.5 MG HALF TABLET
12.5000 mg | ORAL_TABLET | Freq: Two times a day (BID) | ORAL | Status: DC
  Administered 2013-02-06 – 2013-02-07 (×2): 12.5 mg via ORAL
  Filled 2013-02-05 (×5): qty 1

## 2013-02-05 MED ORDER — ASPIRIN EC 325 MG PO TBEC
325.0000 mg | DELAYED_RELEASE_TABLET | Freq: Every day | ORAL | Status: DC
  Administered 2013-02-06 – 2013-02-07 (×2): 325 mg via ORAL
  Filled 2013-02-05 (×2): qty 1

## 2013-02-05 MED ORDER — SODIUM CHLORIDE 0.9 % IV SOLN
INTRAVENOUS | Status: DC | PRN
  Administered 2013-02-05: 13:00:00 via INTRAVENOUS

## 2013-02-05 MED ORDER — SODIUM CHLORIDE 0.9 % IJ SOLN
OROMUCOSAL | Status: DC | PRN
  Administered 2013-02-05: 09:00:00 via TOPICAL

## 2013-02-05 MED ORDER — DOPAMINE-DEXTROSE 3.2-5 MG/ML-% IV SOLN: 0.0000 ug/kg/min | INTRAVENOUS | Status: DC

## 2013-02-05 MED ORDER — ALBUMIN HUMAN 5 % IV SOLN
INTRAVENOUS | Status: DC | PRN
  Administered 2013-02-05: 13:00:00 via INTRAVENOUS

## 2013-02-05 MED ORDER — METOPROLOL TARTRATE 25 MG/10 ML ORAL SUSPENSION
12.5000 mg | Freq: Two times a day (BID) | ORAL | Status: DC
Start: 2013-02-05 — End: 2013-02-07
  Filled 2013-02-05 (×5): qty 5

## 2013-02-05 MED ORDER — PROPOFOL 10 MG/ML IV BOLUS
INTRAVENOUS | Status: DC | PRN
  Administered 2013-02-05: 100 mg via INTRAVENOUS

## 2013-02-05 MED ORDER — BUDESONIDE-FORMOTEROL FUMARATE 160-4.5 MCG/ACT IN AERO
2.0000 | INHALATION_SPRAY | Freq: Two times a day (BID) | RESPIRATORY_TRACT | Status: DC
  Administered 2013-02-05 – 2013-02-10 (×9): 2 via RESPIRATORY_TRACT
  Filled 2013-02-05 (×2): qty 6

## 2013-02-05 MED ORDER — DEXTROSE 5 % IV SOLN
1.5000 g | Freq: Two times a day (BID) | INTRAVENOUS | Status: AC
  Administered 2013-02-05 – 2013-02-07 (×4): 1.5 g via INTRAVENOUS
  Filled 2013-02-05 (×4): qty 1.5

## 2013-02-05 MED ORDER — DEXMEDETOMIDINE HCL IN NACL 200 MCG/50ML IV SOLN
0.1000 ug/kg/h | INTRAVENOUS | Status: DC
  Filled 2013-02-05 (×2): qty 50

## 2013-02-05 MED ORDER — MIDAZOLAM HCL 2 MG/2ML IJ SOLN: 2.0000 mg | INTRAMUSCULAR | Status: DC | PRN

## 2013-02-05 MED ORDER — ACETAMINOPHEN 650 MG RE SUPP
650.0000 mg | Freq: Once | RECTAL | Status: AC
  Administered 2013-02-05: 650 mg via RECTAL

## 2013-02-05 MED ORDER — SODIUM CHLORIDE 0.9 % IJ SOLN
10.0000 mL | Freq: Two times a day (BID) | INTRAMUSCULAR | Status: DC
  Administered 2013-02-06 (×2): 10 mL

## 2013-02-05 MED ORDER — BISACODYL 10 MG RE SUPP: 10.0000 mg | Freq: Every day | RECTAL | Status: DC

## 2013-02-05 MED ORDER — DOCUSATE SODIUM 100 MG PO CAPS
200.0000 mg | ORAL_CAPSULE | Freq: Every day | ORAL | Status: DC
  Administered 2013-02-06 – 2013-02-07 (×2): 200 mg via ORAL
  Filled 2013-02-05 (×2): qty 2

## 2013-02-05 MED ORDER — FAMOTIDINE IN NACL 20-0.9 MG/50ML-% IV SOLN
20.0000 mg | Freq: Two times a day (BID) | INTRAVENOUS | Status: AC
  Administered 2013-02-05 (×2): 20 mg via INTRAVENOUS
  Filled 2013-02-05: qty 50

## 2013-02-05 MED ORDER — ASPIRIN 81 MG PO CHEW: 324.0000 mg | CHEWABLE_TABLET | Freq: Every day | ORAL | Status: DC

## 2013-02-05 MED ORDER — INSULIN REGULAR BOLUS VIA INFUSION
0.0000 [IU] | Freq: Three times a day (TID) | INTRAVENOUS | Status: DC
  Administered 2013-02-06: 3 [IU] via INTRAVENOUS
  Filled 2013-02-05: qty 10

## 2013-02-05 MED ORDER — LACTATED RINGERS IV SOLN
INTRAVENOUS | Status: DC | PRN
  Administered 2013-02-05: 07:00:00 via INTRAVENOUS

## 2013-02-05 MED ORDER — SODIUM CHLORIDE 0.9 % IV SOLN
INTRAVENOUS | Status: DC
  Administered 2013-02-05: 16:00:00 via INTRAVENOUS

## 2013-02-05 MED ORDER — LEVALBUTEROL HCL 1.25 MG/0.5ML IN NEBU
1.2500 mg | INHALATION_SOLUTION | Freq: Four times a day (QID) | RESPIRATORY_TRACT | Status: DC
  Administered 2013-02-05 – 2013-02-06 (×2): 1.25 mg via RESPIRATORY_TRACT
  Filled 2013-02-05 (×7): qty 0.5

## 2013-02-05 MED ORDER — POTASSIUM CHLORIDE 10 MEQ/50ML IV SOLN: 10.0000 meq | INTRAVENOUS | Status: AC

## 2013-02-05 MED ORDER — OXYCODONE HCL 5 MG PO TABS
5.0000 mg | ORAL_TABLET | ORAL | Status: DC | PRN
  Administered 2013-02-06: 10 mg via ORAL
  Administered 2013-02-06: 5 mg via ORAL
  Administered 2013-02-06 (×2): 10 mg via ORAL
  Administered 2013-02-06 (×2): 5 mg via ORAL
  Administered 2013-02-06 – 2013-02-07 (×3): 10 mg via ORAL
  Filled 2013-02-05: qty 1
  Filled 2013-02-05: qty 2
  Filled 2013-02-05: qty 1
  Filled 2013-02-05 (×4): qty 2
  Filled 2013-02-05: qty 1
  Filled 2013-02-05: qty 2

## 2013-02-05 MED ORDER — MIDAZOLAM HCL 2 MG/2ML IJ SOLN
INTRAMUSCULAR | Status: AC
  Administered 2013-02-05: 4 mg via INTRAVENOUS
  Administered 2013-02-05 (×3): 2 mg via INTRAVENOUS
  Administered 2013-02-05: 4 mg via INTRAVENOUS
  Administered 2013-02-05: 2 mg via INTRAVENOUS
  Filled 2013-02-05: qty 2

## 2013-02-05 MED ORDER — SODIUM CHLORIDE 0.45 % IV SOLN
INTRAVENOUS | Status: DC
  Administered 2013-02-05 – 2013-02-07 (×2): via INTRAVENOUS

## 2013-02-05 MED ORDER — SODIUM CHLORIDE 0.9 % IJ SOLN
3.0000 mL | Freq: Two times a day (BID) | INTRAMUSCULAR | Status: DC
  Administered 2013-02-06 – 2013-02-07 (×3): 3 mL via INTRAVENOUS

## 2013-02-05 MED ORDER — ARTIFICIAL TEARS OP OINT
TOPICAL_OINTMENT | OPHTHALMIC | Status: DC | PRN
  Administered 2013-02-05: 1 via OPHTHALMIC

## 2013-02-05 MED ORDER — HEMOSTATIC AGENTS (NO CHARGE) OPTIME
TOPICAL | Status: DC | PRN
  Administered 2013-02-05: 1 via TOPICAL

## 2013-02-05 MED ORDER — LACTATED RINGERS IV SOLN: INTRAVENOUS | Status: DC

## 2013-02-05 MED ORDER — VANCOMYCIN HCL IN DEXTROSE 1-5 GM/200ML-% IV SOLN
1000.0000 mg | Freq: Once | INTRAVENOUS | Status: DC
  Filled 2013-02-05: qty 200

## 2013-02-05 MED ORDER — ONDANSETRON HCL 4 MG/2ML IJ SOLN: 4.0000 mg | Freq: Four times a day (QID) | INTRAMUSCULAR | Status: DC | PRN

## 2013-02-05 MED ORDER — 0.9 % SODIUM CHLORIDE (POUR BTL) OPTIME
TOPICAL | Status: DC | PRN
  Administered 2013-02-05: 7000 mL

## 2013-02-05 MED ORDER — HEPARIN SODIUM (PORCINE) 1000 UNIT/ML IJ SOLN
INTRAMUSCULAR | Status: DC | PRN
  Administered 2013-02-05: 3000 [IU] via INTRAVENOUS
  Administered 2013-02-05: 21000 [IU] via INTRAVENOUS
  Administered 2013-02-05: 3000 [IU] via INTRAVENOUS

## 2013-02-05 MED ORDER — ALBUMIN HUMAN 5 % IV SOLN
250.0000 mL | INTRAVENOUS | Status: AC | PRN
  Administered 2013-02-05 (×2): 250 mL via INTRAVENOUS

## 2013-02-05 MED ORDER — PANTOPRAZOLE SODIUM 40 MG PO TBEC
40.0000 mg | DELAYED_RELEASE_TABLET | Freq: Every day | ORAL | Status: DC
  Administered 2013-02-07: 40 mg via ORAL
  Filled 2013-02-05: qty 1

## 2013-02-05 MED ORDER — MORPHINE SULFATE 2 MG/ML IJ SOLN
1.0000 mg | INTRAMUSCULAR | Status: AC | PRN
  Administered 2013-02-05: 2 mg via INTRAVENOUS

## 2013-02-05 MED ORDER — SODIUM CHLORIDE 0.9 % IV SOLN
INTRAVENOUS | Status: DC
  Administered 2013-02-05: 4.5 [IU]/h via INTRAVENOUS
  Administered 2013-02-06: 10.9 [IU]/h via INTRAVENOUS
  Filled 2013-02-05 (×3): qty 1

## 2013-02-05 MED ORDER — PHENYLEPHRINE HCL 10 MG/ML IJ SOLN
0.0000 ug/min | INTRAVENOUS | Status: DC
  Administered 2013-02-05: 5 ug/min via INTRAVENOUS
  Filled 2013-02-05 (×2): qty 2

## 2013-02-05 MED ORDER — FENOFIBRATE 160 MG PO TABS
160.0000 mg | ORAL_TABLET | Freq: Every day | ORAL | Status: DC
  Administered 2013-02-06 – 2013-02-09 (×4): 160 mg via ORAL
  Filled 2013-02-05 (×5): qty 1

## 2013-02-05 MED ORDER — LACTATED RINGERS IV SOLN: 500.0000 mL | Freq: Once | INTRAVENOUS | Status: AC | PRN

## 2013-02-05 MED ORDER — INSULIN ASPART 100 UNIT/ML ~~LOC~~ SOLN
5.0000 [IU] | SUBCUTANEOUS | Status: AC
  Administered 2013-02-05: 5 [IU] via SUBCUTANEOUS
  Filled 2013-02-05: qty 0.05

## 2013-02-05 MED ORDER — SODIUM CHLORIDE 0.9 % IJ SOLN
3.0000 mL | INTRAMUSCULAR | Status: DC | PRN
  Administered 2013-02-05 (×2): 3 mL via INTRAVENOUS

## 2013-02-05 MED ORDER — METOPROLOL TARTRATE 1 MG/ML IV SOLN: 2.5000 mg | INTRAVENOUS | Status: DC | PRN

## 2013-02-05 MED ORDER — ACETAMINOPHEN 160 MG/5ML PO SOLN: 1000.0000 mg | Freq: Four times a day (QID) | ORAL | Status: DC

## 2013-02-05 MED ORDER — VANCOMYCIN HCL IN DEXTROSE 1-5 GM/200ML-% IV SOLN
1000.0000 mg | Freq: Two times a day (BID) | INTRAVENOUS | Status: AC
  Administered 2013-02-05 – 2013-02-06 (×3): 1000 mg via INTRAVENOUS
  Filled 2013-02-05 (×3): qty 200

## 2013-02-05 SURGICAL SUPPLY — 124 items
ADAPTER CARDIO PERF ANTE/RETRO (ADAPTER) ×5 IMPLANT
APPLIER CLIP 9.375 SM OPEN (CLIP) ×5
ATTRACTOMAT 16X20 MAGNETIC DRP (DRAPES) ×5 IMPLANT
BAG DECANTER FOR FLEXI CONT (MISCELLANEOUS) ×5 IMPLANT
BANDAGE ELASTIC 4 VELCRO ST LF (GAUZE/BANDAGES/DRESSINGS) ×10 IMPLANT
BANDAGE ELASTIC 6 VELCRO ST LF (GAUZE/BANDAGES/DRESSINGS) ×5 IMPLANT
BANDAGE GAUZE ELAST BULKY 4 IN (GAUZE/BANDAGES/DRESSINGS) ×10 IMPLANT
BASKET HEART  (ORDER IN 25'S) (MISCELLANEOUS) ×1
BASKET HEART (ORDER IN 25'S) (MISCELLANEOUS) ×1
BASKET HEART (ORDER IN 25S) (MISCELLANEOUS) ×3 IMPLANT
BENZOIN TINCTURE PRP APPL 2/3 (GAUZE/BANDAGES/DRESSINGS) ×5 IMPLANT
BLADE MINI RND TIP GREEN BEAV (BLADE) ×5 IMPLANT
BLADE STERNUM SYSTEM 6 (BLADE) ×5 IMPLANT
BLADE SURG 11 STRL SS (BLADE) ×5 IMPLANT
BLADE SURG 12 STRL SS (BLADE) ×5 IMPLANT
BLADE SURG 15 STRL LF DISP TIS (BLADE) ×3 IMPLANT
BLADE SURG 15 STRL SS (BLADE) ×2
BLADE SURG ROTATE 9660 (MISCELLANEOUS) IMPLANT
CANISTER SUCTION 2500CC (MISCELLANEOUS) ×5 IMPLANT
CANNULA AORTIC HI-FLOW 6.5M20F (CANNULA) ×5 IMPLANT
CANNULA ARTERIAL NVNT 3/8 20FR (MISCELLANEOUS) ×5 IMPLANT
CANNULA GUNDRY RCSP 15FR (MISCELLANEOUS) ×5 IMPLANT
CANNULA VENOUS LOW PROF 32X40 (CANNULA) ×5 IMPLANT
CANNULA VENOUS LOW PROF 34X46 (CANNULA) ×5 IMPLANT
CANNULA VENOUS MAL SGL STG 40 (MISCELLANEOUS) IMPLANT
CANNULAE VENOUS MAL SGL STG 40 (MISCELLANEOUS)
CATH CPB KIT VANTRIGT (MISCELLANEOUS) ×5 IMPLANT
CATH ROBINSON RED A/P 18FR (CATHETERS) ×15 IMPLANT
CATH THORACIC 28FR (CATHETERS) IMPLANT
CATH THORACIC 28FR RT ANG (CATHETERS) IMPLANT
CATH THORACIC 36FR (CATHETERS) IMPLANT
CATH THORACIC 36FR RT ANG (CATHETERS) ×10 IMPLANT
CLIP APPLIE 9.375 SM OPEN (CLIP) ×3 IMPLANT
CLIP FOGARTY SPRING 6M (CLIP) ×5 IMPLANT
CLIP TI MEDIUM 24 (CLIP) IMPLANT
CLIP TI WIDE RED SMALL 24 (CLIP) ×10 IMPLANT
CLOSURE STERI-STRIP 1/2X4 (GAUZE/BANDAGES/DRESSINGS) ×1
CLSR STERI-STRIP ANTIMIC 1/2X4 (GAUZE/BANDAGES/DRESSINGS) ×4 IMPLANT
COVER MAYO STAND STRL (DRAPES) ×5 IMPLANT
COVER SURGICAL LIGHT HANDLE (MISCELLANEOUS) ×5 IMPLANT
CRADLE DONUT ADULT HEAD (MISCELLANEOUS) ×5 IMPLANT
DRAIN CHANNEL 32F RND 10.7 FF (WOUND CARE) ×5 IMPLANT
DRAPE CARDIOVASCULAR INCISE (DRAPES) ×2
DRAPE EXTREMITY T 121X128X90 (DRAPE) ×5 IMPLANT
DRAPE PROXIMA HALF (DRAPES) IMPLANT
DRAPE SLUSH/WARMER DISC (DRAPES) ×5 IMPLANT
DRAPE SRG 135X102X78XABS (DRAPES) ×3 IMPLANT
DRSG AQUACEL AG ADV 3.5X14 (GAUZE/BANDAGES/DRESSINGS) ×5 IMPLANT
ELECT BLADE 4.0 EZ CLEAN MEGAD (MISCELLANEOUS) ×5
ELECT BLADE 6.5 EXT (BLADE) ×5 IMPLANT
ELECT CAUTERY BLADE 6.4 (BLADE) ×5 IMPLANT
ELECT REM PT RETURN 9FT ADLT (ELECTROSURGICAL) ×10
ELECTRODE BLDE 4.0 EZ CLN MEGD (MISCELLANEOUS) ×3 IMPLANT
ELECTRODE REM PT RTRN 9FT ADLT (ELECTROSURGICAL) ×6 IMPLANT
GEL ULTRASOUND 20GR AQUASONIC (MISCELLANEOUS) ×5 IMPLANT
GLOVE BIO SURGEON STRL SZ7 (GLOVE) ×25 IMPLANT
GLOVE BIO SURGEON STRL SZ7.5 (GLOVE) ×15 IMPLANT
GOWN PREVENTION PLUS XLARGE (GOWN DISPOSABLE) ×15 IMPLANT
GOWN STRL NON-REIN LRG LVL3 (GOWN DISPOSABLE) ×20 IMPLANT
HARMONIC SHEARS 14CM COAG (MISCELLANEOUS) ×5 IMPLANT
HEMOSTAT POWDER SURGIFOAM 1G (HEMOSTASIS) ×15 IMPLANT
HEMOSTAT SURGICEL 2X14 (HEMOSTASIS) ×5 IMPLANT
INSERT FOGARTY XLG (MISCELLANEOUS) IMPLANT
KIT BASIN OR (CUSTOM PROCEDURE TRAY) ×5 IMPLANT
KIT ROOM TURNOVER OR (KITS) ×10 IMPLANT
KIT SUCTION CATH 14FR (SUCTIONS) ×5 IMPLANT
KIT VASOVIEW W/TROCAR VH 2000 (KITS) ×5 IMPLANT
LEAD PACING MYOCARDI (MISCELLANEOUS) ×5 IMPLANT
MARKER GRAFT CORONARY BYPASS (MISCELLANEOUS) ×20 IMPLANT
MARKER SKIN DUAL TIP RULER LAB (MISCELLANEOUS) ×5 IMPLANT
NS IRRIG 1000ML POUR BTL (IV SOLUTION) ×25 IMPLANT
PACK OPEN HEART (CUSTOM PROCEDURE TRAY) ×5 IMPLANT
PAD ARMBOARD 7.5X6 YLW CONV (MISCELLANEOUS) ×20 IMPLANT
PAD ELECT DEFIB RADIOL ZOLL (MISCELLANEOUS) ×5 IMPLANT
PENCIL BUTTON HOLSTER BLD 10FT (ELECTRODE) ×5 IMPLANT
PUNCH AORTIC ROTATE 4.0MM (MISCELLANEOUS) ×5 IMPLANT
PUNCH AORTIC ROTATE 4.5MM 8IN (MISCELLANEOUS) IMPLANT
PUNCH AORTIC ROTATE 5MM 8IN (MISCELLANEOUS) IMPLANT
SET CARDIOPLEGIA MPS 5001102 (MISCELLANEOUS) ×5 IMPLANT
SPONGE GAUZE 4X4 12PLY (GAUZE/BANDAGES/DRESSINGS) ×15 IMPLANT
SPONGE LAP 18X18 X RAY DECT (DISPOSABLE) ×25 IMPLANT
SPONGE LAP 4X18 X RAY DECT (DISPOSABLE) ×10 IMPLANT
STAPLER SKIN 35 WIDE (STAPLE) ×5 IMPLANT
SURGIFLO W/THROMBIN 8M KIT (HEMOSTASIS) ×5 IMPLANT
SUT BONE WAX W31G (SUTURE) ×5 IMPLANT
SUT MNCRL AB 4-0 PS2 18 (SUTURE) ×5 IMPLANT
SUT PROLENE 3 0 SH DA (SUTURE) IMPLANT
SUT PROLENE 3 0 SH1 36 (SUTURE) IMPLANT
SUT PROLENE 4 0 RB 1 (SUTURE) ×2
SUT PROLENE 4 0 SH DA (SUTURE) ×5 IMPLANT
SUT PROLENE 4-0 RB1 .5 CRCL 36 (SUTURE) ×3 IMPLANT
SUT PROLENE 5 0 C 1 36 (SUTURE) IMPLANT
SUT PROLENE 6 0 C 1 30 (SUTURE) IMPLANT
SUT PROLENE 6 0 CC (SUTURE) ×75 IMPLANT
SUT PROLENE 7 0 DA (SUTURE) IMPLANT
SUT PROLENE 7.0 RB 3 (SUTURE) ×15 IMPLANT
SUT PROLENE 8 0 BV175 6 (SUTURE) ×25 IMPLANT
SUT PROLENE BLUE 7 0 (SUTURE) ×10 IMPLANT
SUT SILK  1 MH (SUTURE)
SUT SILK 1 MH (SUTURE) IMPLANT
SUT SILK 2 0 SH CR/8 (SUTURE) ×5 IMPLANT
SUT SILK 2 0SH CR/8 30 (SUTURE) IMPLANT
SUT SILK 3 0 SH CR/8 (SUTURE) IMPLANT
SUT STEEL 6MS V (SUTURE) ×10 IMPLANT
SUT STEEL SZ 6 DBL 3X14 BALL (SUTURE) ×5 IMPLANT
SUT VIC AB 1 CTX 36 (SUTURE) ×4
SUT VIC AB 1 CTX36XBRD ANBCTR (SUTURE) ×6 IMPLANT
SUT VIC AB 2-0 CT1 27 (SUTURE) ×2
SUT VIC AB 2-0 CT1 TAPERPNT 27 (SUTURE) ×3 IMPLANT
SUT VIC AB 2-0 CTX 27 (SUTURE) IMPLANT
SUT VIC AB 3-0 SH 27 (SUTURE)
SUT VIC AB 3-0 SH 27X BRD (SUTURE) IMPLANT
SUT VIC AB 3-0 X1 27 (SUTURE) IMPLANT
SUT VICRYL 4-0 PS2 18IN ABS (SUTURE) IMPLANT
SUTURE E-PAK OPEN HEART (SUTURE) ×5 IMPLANT
SYR 50ML SLIP (SYRINGE) IMPLANT
SYSTEM SAHARA CHEST DRAIN ATS (WOUND CARE) ×5 IMPLANT
TAPE CLOTH SURG 4X10 WHT LF (GAUZE/BANDAGES/DRESSINGS) ×5 IMPLANT
TOWEL OR 17X24 6PK STRL BLUE (TOWEL DISPOSABLE) ×10 IMPLANT
TOWEL OR 17X26 10 PK STRL BLUE (TOWEL DISPOSABLE) ×15 IMPLANT
TRAY FOLEY IC TEMP SENS 14FR (CATHETERS) ×5 IMPLANT
TUBING INSUFFLATION 10FT LAP (TUBING) ×5 IMPLANT
UNDERPAD 30X30 INCONTINENT (UNDERPADS AND DIAPERS) ×10 IMPLANT
WATER STERILE IRR 1000ML POUR (IV SOLUTION) ×10 IMPLANT

## 2013-02-05 NOTE — Anesthesia Postprocedure Evaluation (Signed)
  Anesthesia Post-op Note  Patient: Juan [name]  Procedure(s) Performed: Procedure(s) with comments: CORONARY ARTERY BYPASS GRAFTING (CABG) (N/A) - Coronary artery bypass graft on pump times four using left internal mammary artery and right greater saphenous vein via endovein harvest and left radial artery harvest.  INTRAOPERATIVE TRANSESOPHAGEAL ECHOCARDIOGRAM (N/A) RADIAL ARTERY HARVEST (Left)  Patient Location: ICU  Anesthesia Type:General  Level of Consciousness: Patient remains intubated per anesthesia plan  Airway and Oxygen Therapy: Patient remains intubated per anesthesia plan and Patient placed on Ventilator (see vital sign flow sheet for setting)  Post-op Pain: none  Post-op Assessment: Post-op Vital signs reviewed, Patient's Cardiovascular Status Stable, Respiratory Function Stable, Patent Airway, No signs of Nausea or vomiting and Pain level controlled  Post-op Vital Signs: stable  Complications: none

## 2013-02-05 NOTE — OR Nursing (Signed)
Second call placed to SICU.  

## 2013-02-05 NOTE — Progress Notes (Signed)
CT surgery p.m. Rounds  Status post CABG x4 with left radial artery Extubated-sinus rhythm with stable hemodynamics Left hand warm intact neuro 6 hour labs reviewed and are satisfactory

## 2013-02-05 NOTE — Anesthesia Procedure Notes (Signed)
Procedures PA catheter:  Routine monitors. Timeout, sterile prep, drape, FBP R neck.  Trendelenburg position.  1% Lido local, finder and trocar RIJ 1st pass with US guidance.  Cordis placed over J wire. PA catheter would not pass out of RV into PA, lodged at tip of introducer.  Sterile dressing applied.  Patient tolerated well, VSS.  Sandford Craze, MD [phone]  Discussed with Dr. Donata Clay, RIJ cordis and PA cath removed.  Sandford Craze, MD

## 2013-02-05 NOTE — OR Nursing (Signed)
1st call to SICU 

## 2013-02-05 NOTE — Plan of Care (Signed)
Problem: Phase II Progression Outcomes Goal: Tolerates weaning with O2 Sat > 90 Weaning FiO2 and decreasing rate. Started rapid wean process at 1730 approximately. Goal: Cardiac index > or equal to 1.8 Outcome: Progressing On Dopamine at 3 mcg/kg/min and CI> 2.0 Goal: CBGs/Blood glucose < or equal to 120 Outcome: Progressing On insulin drip until 1400 on POD 1. Diabetic with A1c or 10.2.

## 2013-02-05 NOTE — Brief Op Note (Signed)
      [address]       [place] [address]             [phone]     02/03/2013 - 02/05/2013  12:24 PM  PATIENT:  Juan [name]  47 y.o. male  PRE-OPERATIVE DIAGNOSIS:  cad  POST-OPERATIVE DIAGNOSIS:  Coronary Artery Disease  PROCEDURE:  Procedure(s): CORONARY ARTERY BYPASS GRAFTING (CABG)X4  LIMA-LAD; LEFT RADIAL-OM; SVG-DIAG; SVG-PDA INTRAOPERATIVE TRANSESOPHAGEAL ECHOCARDIOGRAM LEFT RADIAL ARTERY HARVEST EVH- RIGHT THIGH  SURGEON:  Surgeon(s): Kerin Perna, MD Kerin Perna, MD  PHYSICIAN ASSISTANT: Rodriques Badie PA-C                                           GINA COLLINS PA-C  ANESTHESIA:   general  PATIENT CONDITION:  ICU - intubated and hemodynamically stable.  PRE-OPERATIVE WEIGHT: 207kg  COMPLICATIONS: NO KNOWN

## 2013-02-05 NOTE — Progress Notes (Signed)
The patient was examined and preop studies reviewed. There has been no change from the prior exam and the patient is ready for surgery.  Plan CABG on D Toste today

## 2013-02-05 NOTE — Procedures (Signed)
Extubation Procedure Note  Patient Details:   Name: Juan [name] DOB: Nov 24, 1965 MRN: [id number]   Airway Documentation:     Evaluation  O2 sats: stable throughout Complications: No apparent complications Patient did tolerate procedure well. Bilateral Breath Sounds: Rhonchi   Yes  Pt tolerated rapid wean, 1.5L VC, -20 NIF, positive for cuff leak, extubated to 4lpm . No stridor or dyspnea noted after extubation. All vitals are within normal limits. RT Will continue to monitor.   Beatris Si 02/05/2013, 7:40 PM

## 2013-02-05 NOTE — Transfer of Care (Signed)
Immediate Anesthesia Transfer of Care Note  Patient: Juan [name]  Procedure(s) Performed: Procedure(s) with comments: CORONARY ARTERY BYPASS GRAFTING (CABG) (N/A) - Coronary artery bypass graft on pump times four using left internal mammary artery and right greater saphenous vein via endovein harvest and left radial artery harvest.  INTRAOPERATIVE TRANSESOPHAGEAL ECHOCARDIOGRAM (N/A) RADIAL ARTERY HARVEST (Left)  Patient Location: PACU and SICU  Anesthesia Type:General  Level of Consciousness: sedated, unresponsive and Patient remains intubated per anesthesia plan  Airway & Oxygen Therapy: Patient remains intubated per anesthesia plan and Patient placed on Ventilator (see vital sign flow sheet for setting)  Post-op Assessment: Report given to PACU RN and Post -op Vital signs reviewed and stable  Post vital signs: Reviewed and stable  Complications: No apparent anesthesia complications

## 2013-02-06 ENCOUNTER — Encounter (HOSPITAL_COMMUNITY): Payer: Self-pay | Admitting: Cardiology

## 2013-02-06 ENCOUNTER — Inpatient Hospital Stay (HOSPITAL_COMMUNITY): Payer: Managed Care, Other (non HMO)

## 2013-02-06 DIAGNOSIS — Z951 Presence of aortocoronary bypass graft: Secondary | ICD-10-CM

## 2013-02-06 DIAGNOSIS — R079 Chest pain, unspecified: Secondary | ICD-10-CM

## 2013-02-06 DIAGNOSIS — R9439 Abnormal result of other cardiovascular function study: Secondary | ICD-10-CM

## 2013-02-06 HISTORY — DX: Presence of aortocoronary bypass graft: Z95.1

## 2013-02-06 HISTORY — DX: Abnormal result of other cardiovascular function study: R94.39

## 2013-02-06 LAB — BASIC METABOLIC PANEL
BUN: 16 mg/dL (ref 6–23)
Chloride: 107 mEq/L (ref 96–112)
Creatinine, Ser: 0.91 mg/dL (ref 0.50–1.35)
GFR calc Af Amer: >90 mL/min (ref >90)
GFR calc non Af Amer: >90 mL/min (ref >90)
Glucose, Bld: 132 mg/dL — ABNORMAL HIGH (ref 70–99)
Potassium: 4.5 mEq/L (ref 3.5–5.1)

## 2013-02-06 LAB — GLUCOSE, CAPILLARY
Glucose-Capillary: 133 mg/dL — ABNORMAL HIGH (ref 70–99)
Glucose-Capillary: 138 mg/dL — ABNORMAL HIGH (ref 70–99)
Glucose-Capillary: 116 mg/dL — ABNORMAL HIGH (ref 70–99)
Glucose-Capillary: 127 mg/dL — ABNORMAL HIGH (ref 70–99)
Glucose-Capillary: 125 mg/dL — ABNORMAL HIGH (ref 70–99)
Glucose-Capillary: 128 mg/dL — ABNORMAL HIGH (ref 70–99)
Glucose-Capillary: 129 mg/dL — ABNORMAL HIGH (ref 70–99)
Glucose-Capillary: 136 mg/dL — ABNORMAL HIGH (ref 70–99)
Glucose-Capillary: 144 mg/dL — ABNORMAL HIGH (ref 70–99)

## 2013-02-06 LAB — POCT I-STAT, CHEM 8
Chloride: 105 mEq/L (ref 96–112)
Creatinine, Ser: 1.1 mg/dL (ref 0.50–1.35)
Glucose, Bld: 144 mg/dL — ABNORMAL HIGH (ref 70–99)
HCT: 29 % — ABNORMAL LOW (ref 39.0–52.0)
Potassium: 4.6 mEq/L (ref 3.5–5.1)

## 2013-02-06 LAB — CBC
HCT: 31.1 % — ABNORMAL LOW (ref 39.0–52.0)
HCT: 29.1 % — ABNORMAL LOW (ref 39.0–52.0)
Hemoglobin: 9.8 g/dL — ABNORMAL LOW (ref 13.0–17.0)
MCHC: 34.4 g/dL (ref 30.0–36.0)
MCHC: 33.7 g/dL (ref 30.0–36.0)
MCV: 76.6 fL — ABNORMAL LOW (ref 78.0–100.0)
MCV: 78.2 fL (ref 78.0–100.0)
Platelets: 107 10*3/uL — ABNORMAL LOW (ref 150–400)
RBC: 4.06 MIL/uL — ABNORMAL LOW (ref 4.22–5.81)
RBC: 3.72 MIL/uL — ABNORMAL LOW (ref 4.22–5.81)
RDW: 14.9 % (ref 11.5–15.5)
RDW: 15.1 % (ref 11.5–15.5)
WBC: 9.4 10*3/uL (ref 4.0–10.5)
WBC: 7.9 10*3/uL (ref 4.0–10.5)

## 2013-02-06 LAB — PREPARE PLATELET PHERESIS: Unit division: 0

## 2013-02-06 LAB — MAGNESIUM: Magnesium: 2.5 mg/dL (ref 1.5–2.5)

## 2013-02-06 LAB — CREATININE, SERUM: GFR calc Af Amer: 86 mL/min — ABNORMAL LOW (ref >90)

## 2013-02-06 MED ORDER — INSULIN DETEMIR 100 UNIT/ML ~~LOC~~ SOLN
30.0000 [IU] | Freq: Two times a day (BID) | SUBCUTANEOUS | Status: DC
  Administered 2013-02-06 – 2013-02-08 (×5): 30 [IU] via SUBCUTANEOUS
  Filled 2013-02-06 (×8): qty 0.3

## 2013-02-06 MED ORDER — LEVALBUTEROL HCL 1.25 MG/0.5ML IN NEBU
1.2500 mg | INHALATION_SOLUTION | Freq: Four times a day (QID) | RESPIRATORY_TRACT | Status: DC | PRN
  Filled 2013-02-06: qty 0.5

## 2013-02-06 MED ORDER — FUROSEMIDE 10 MG/ML IJ SOLN
20.0000 mg | Freq: Once | INTRAMUSCULAR | Status: AC
  Administered 2013-02-06: 20 mg via INTRAVENOUS
  Filled 2013-02-06: qty 2

## 2013-02-06 MED ORDER — INSULIN ASPART 100 UNIT/ML ~~LOC~~ SOLN
4.0000 [IU] | Freq: Three times a day (TID) | SUBCUTANEOUS | Status: DC
  Administered 2013-02-06 – 2013-02-07 (×4): 4 [IU] via SUBCUTANEOUS
  Administered 2013-02-08: 8 [IU] via SUBCUTANEOUS

## 2013-02-06 MED ORDER — INSULIN ASPART 100 UNIT/ML ~~LOC~~ SOLN
0.0000 [IU] | SUBCUTANEOUS | Status: DC
  Administered 2013-02-06: 16 [IU] via SUBCUTANEOUS
  Administered 2013-02-06: 2 [IU] via SUBCUTANEOUS
  Administered 2013-02-06: 9.8 [IU] via SUBCUTANEOUS
  Administered 2013-02-07: 12 [IU] via SUBCUTANEOUS
  Administered 2013-02-07 (×2): 8 [IU] via SUBCUTANEOUS

## 2013-02-06 MED ORDER — ISOSORBIDE MONONITRATE 15 MG HALF TABLET
15.0000 mg | ORAL_TABLET | Freq: Every day | ORAL | Status: DC
  Administered 2013-02-06 – 2013-02-10 (×4): 15 mg via ORAL
  Filled 2013-02-06 (×6): qty 1

## 2013-02-06 MED ORDER — TRAMADOL HCL 50 MG PO TABS: 50.0000 mg | ORAL_TABLET | Freq: Four times a day (QID) | ORAL | Status: DC | PRN

## 2013-02-06 MED FILL — Sodium Bicarbonate IV Soln 8.4%: INTRAVENOUS | Qty: 50 | Status: AC

## 2013-02-06 MED FILL — Potassium Chloride Inj 2 mEq/ML: INTRAVENOUS | Qty: 40 | Status: AC

## 2013-02-06 MED FILL — Heparin Sodium (Porcine) Inj 1000 Unit/ML: INTRAMUSCULAR | Qty: 30 | Status: AC

## 2013-02-06 MED FILL — Lidocaine HCl IV Inj 20 MG/ML: INTRAVENOUS | Qty: 5 | Status: AC

## 2013-02-06 MED FILL — Electrolyte-R (PH 7.4) Solution: INTRAVENOUS | Qty: 3000 | Status: AC

## 2013-02-06 MED FILL — Sodium Chloride Irrigation Soln 0.9%: Qty: 3000 | Status: AC

## 2013-02-06 MED FILL — Sodium Chloride IV Soln 0.9%: INTRAVENOUS | Qty: 1000 | Status: AC

## 2013-02-06 MED FILL — Mannitol IV Soln 20%: INTRAVENOUS | Qty: 500 | Status: AC

## 2013-02-06 MED FILL — Magnesium Sulfate Inj 50%: INTRAMUSCULAR | Qty: 10 | Status: AC

## 2013-02-06 MED FILL — Heparin Sodium (Porcine) Inj 1000 Unit/ML: INTRAMUSCULAR | Qty: 20 | Status: AC

## 2013-02-06 NOTE — Progress Notes (Signed)
Subjective: Awake no significant complaints, overall doing well.   Objective: Vital signs in last 24 hours: Temp:  [99.3 F (37.4 C)-101.7 F (38.7 C)] 99.5 F (37.5 C) (11/21 0915) Pulse Rate:  [91-103] 94 (11/21 0915) Resp:  [9-28] 23 (11/21 0915) BP: (84-124)/(56-74) 117/69 mmHg (11/21 0915) SpO2:  [96 %-100 %] 97 % (11/21 0915) Arterial Line BP: (59-130)/(52-112) 59/55 mmHg (11/21 0915) FiO2 (%):  [4 %-50 %] 4 % (11/20 1915) Weight:  [205 lb 11 oz (93.3 kg)-207 lb 7.3 oz (94.1 kg)] 205 lb 11 oz (93.3 kg) (11/21 0600) Weight change: 0 lb (0 kg) Last BM Date: 02/04/13 Intake/Output from previous day: +1473 wt 205 down from 211 11/20 0701 - 11/21 0700 In: 6193.5 [I.V.:3981.5; Blood:1032; NG/GT:30; IV Piggyback:1150] Out: 4720 [Urine:3020; Emesis/NG output:150; Blood:1150; Chest Tube:400] Intake/Output this shift: Total I/O In: 317.7 [I.V.:117.7; IV Piggyback:200] Out: 60 [Urine:20; Chest Tube:40]  PE: General:Pleasant affect, NAD Skin:Warm and dry, brisk capillary refill HEENT:normocephalic, sclera clear, mucus membranes moist Heart:S1S2 RRR without murmur, gallup, rub or click Lungs:clear, ant without rales, rhonchi, or wheezes WUJ:WJXB, non tender, + BS, do not palpate liver spleen or masses Ext:no lower ext edema, 2+ pedal pulses, 2+ radial pulses Neuro:alert and oriented, MAE, follows commands, + facial symmetry  Tele:  SR  PAPs PA 26/13 CO/CI  6.4/3  Lab Results:  Recent Labs  02/05/13 2040 02/05/13 2102 02/06/13 0420  WBC 10.1  --  9.4  HGB 11.4* 11.9* 10.7*  HCT 32.4* 35.0* 31.1*  PLT REPEATED TO VERIFY  --  107*   BMET  Recent Labs  02/05/13 0254  02/05/13 2102 02/06/13 0420  NA 135  < > 140 139  K 3.5  < > 4.8 4.5  CL 98  --  106 107  CO2 23  --   --  23  GLUCOSE 81  < > 156* 132*  BUN 22  --  19 16  CREATININE 0.97  < > 1.10 0.91  CALCIUM 10.1  --   --  9.6  < > = values in this interval not displayed. No results found  for this basename: TROPONINI, CK, MB,  in the last 72 hours  Lab Results  Component Value Date   CHOL 142 06/17/2011   HDL 10* 06/17/2011   LDLCALC UNABLE TO CALCULATE IF TRIGLYCERIDE OVER 400 mg/dL 1/47/8295   TRIG 621* 05/24/6576   CHOLHDL 14.2 06/17/2011   Lab Results  Component Value Date   HGBA1C 10.2* 02/04/2013     Lab Results  Component Value Date   TSH 1.291 09/28/2009    Hepatic Function Panel  Recent Labs  02/04/13 0940  PROT 7.9  ALBUMIN 3.7  AST 24  ALT 34  ALKPHOS 69  BILITOT 0.4     Studies/Results: Dg Chest Portable 1 View In Am  02/06/2013   CLINICAL DATA:  Coronary artery disease.  Status post CABG.  EXAM: PORTABLE CHEST - 1 VIEW  COMPARISON:  02/05/2013 and 02/02/2013  FINDINGS: Swan-Ganz catheter and chest tubes appear unchanged. Endotracheal tube and NG tube have been removed.  Minimal atelectasis or fluid at the right base has resolved. Left lung is clear. No pneumothorax.  IMPRESSION: Clear lungs.  No pneumothorax.   Electronically Signed   By: Geanie Cooley M.D.   On: 02/06/2013 07:51   Dg Chest Portable 1 View  02/05/2013   CLINICAL DATA:  CABG.  EXAM: PORTABLE CHEST - 1 VIEW  COMPARISON:  02/02/2013.  FINDINGS: Endotracheal tube noted projected 3 cm above the carina. NG tube noted coiled in stomach. Swan-Ganz catheter noted with tip projected over right pulmonary artery. Mediastinal drainage catheter noted. Left chest tube noted. No pneumothorax. CABG. No pulmonary venous congestion. Mild atelectasis lung bases. Density is noted projected over the right mid lung field. This could represent a region of atelectasis or could represent surgical padding, clinical correlation suggested and follow-up chest x-ray can be obtained.  IMPRESSION: 1. Prior CABG.  Good positioning of lines and tubes.  2. Density noted projected over the right mid chest, although this could represent atelectasis, surgical padding could present in this fashion, clinical correlation suggested  and follow-up chest x-ray can be obtained if needed. These results were called by telephone at the time of interpretation on 02/05/2013 at 3:04 PM to the patient's ICU nurse, who verbally acknowledged these results.   Electronically Signed   By: Maisie Fus  Register   On: 02/05/2013 15:08    Medications: I have reviewed the patient's current medications. Scheduled Meds: . acetaminophen  1,000 mg Oral Q6H   Or  . acetaminophen (TYLENOL) oral liquid 160 mg/5 mL  1,000 mg Per Tube Q6H  . aspirin EC  325 mg Oral Daily   Or  . aspirin  324 mg Per Tube Daily  . bisacodyl  10 mg Oral Daily   Or  . bisacodyl  10 mg Rectal Daily  . budesonide-formoterol  2 puff Inhalation BID  . cefUROXime (ZINACEF)  IV  1.5 g Intravenous Q12H  . docusate sodium  200 mg Oral Daily  . fenofibrate  160 mg Oral q1800  . insulin aspart  0-24 Units Subcutaneous Q4H  . insulin aspart  4 Units Subcutaneous TID WC  . insulin detemir  30 Units Subcutaneous BID  . insulin regular  0-10 Units Intravenous TID WC  . isosorbide mononitrate  15 mg Oral Daily  . metoprolol tartrate  12.5 mg Oral BID   Or  . metoprolol tartrate  12.5 mg Per Tube BID  . mupirocin ointment  1 application Nasal BID  . [START ON 02/07/2013] pantoprazole  40 mg Oral Daily  . sodium chloride  10-40 mL Intracatheter Q12H  . sodium chloride  3 mL Intravenous Q12H  . vancomycin  1,000 mg Intravenous Q12H   Continuous Infusions: . sodium chloride 20 mL/hr at 02/06/13 0900  . sodium chloride 20 mL/hr at 02/05/13 1627  . sodium chloride    . insulin (NOVOLIN-R) infusion 10.9 Units/hr (02/06/13 0930)  . lactated ringers 20 mL/hr at 02/06/13 0900   PRN Meds:.albumin human, levalbuterol, metoprolol, morphine injection, ondansetron (ZOFRAN) IV, oxyCODONE, sodium chloride, sodium chloride, traMADol  Assessment/Plan: Principal Problem:   CAD (coronary artery disease), LAD 90%, 1st diag 95%, LCX 70% wth AV groove 90%, RCA 40-50% mid and 80% long distal  stenosis 02/03/13 Active Problems:   S/P CABG x 4, 02/05/13 LIMA-LAD; LT. RADIAL-OM;VG-DIAG; VG-PDA   Abnormal cardiovascular stress test   Hyperlipidemia   DM2 (diabetes mellitus, type 2)   Chest pain  PLAN: POST OP Day 1 PROCEDURE: Procedure(s):  CORONARY ARTERY BYPASS GRAFTING (CABG)X4 LIMA-LAD; LEFT RADIAL-OM; SVG-DIAG; SVG-PDA  INTRAOPERATIVE TRANSESOPHAGEAL ECHOCARDIOGRAM  LEFT RADIAL ARTERY HARVEST  EVH- RIGHT THIGH Off Dopamine, NTG and Neo down to 5. Progressing well.  BP by cuff good.    LOS: 3 days   Time spent with pt. : 15 minutes. [facility] R  Nurse Practitioner Certified Pager [phone] 02/06/2013, 10:04 AM   Agree with  note written by Nada Boozer RNP  POD #2 GABG X4. Looks great. Off all drips. NSR. Good Sats. Hemodynamically stable. Labs OK. Exam benign. Nl progression. Rx per TCTS.  Runell Gess 02/06/2013 11:49 AM

## 2013-02-06 NOTE — Progress Notes (Signed)
Patient ID: Juan [name], male   DOB: 08/11/1965, 47 y.o.   MRN: [id number] EVENING ROUNDS NOTE :     [address]       [place] [address]             [phone]                 1 Day Post-Op Procedure(s) (LRB): CORONARY ARTERY BYPASS GRAFTING (CABG) (N/A) INTRAOPERATIVE TRANSESOPHAGEAL ECHOCARDIOGRAM (N/A) RADIAL ARTERY HARVEST (Left)  Total Length of Stay:  LOS: 3 days  BP 126/70  Pulse 102  Temp(Src) 98.1 F (36.7 C) (Oral)  Resp 27  Ht 5\' 11"  (1.803 m)  Wt 205 lb 11 oz (93.3 kg)  BMI 28.70 kg/m2  SpO2 95%  .Intake/Output     11/20 0701 - 11/21 0700 11/21 0701 - 11/22 0700   P.O.  960   I.V. (mL/kg) 3981.5 (42.7) 314.9 (3.4)   Blood 1032    NG/GT 30    IV Piggyback 1150 200   Total Intake(mL/kg) 6193.5 (66.4) 1474.9 (15.8)   Urine (mL/kg/hr) 3020 (1.3) 1005 (1)   Emesis/NG output 150 (0.1)    Blood 1150 (0.5)    Chest Tube 400 (0.2) 140 (0.1)   Total Output 4720 1145   Net +1473.5 +329.9          . sodium chloride 20 mL/hr at 02/06/13 1400  . sodium chloride 20 mL/hr at 02/05/13 1627  . sodium chloride    . insulin (NOVOLIN-R) infusion 10.9 Units/hr (02/06/13 0930)  . lactated ringers Stopped (02/06/13 1200)     Lab Results  Component Value Date   WBC 7.9 02/06/2013   HGB 9.9* 02/06/2013   HCT 29.0* 02/06/2013   PLT 74* 02/06/2013   GLUCOSE 144* 02/06/2013   CHOL 142 06/17/2011   TRIG 709* 06/17/2011   HDL 10* 06/17/2011   LDLCALC UNABLE TO CALCULATE IF TRIGLYCERIDE OVER 400 mg/dL 05/02/863   ALT 34 78/46/9629   AST 24 02/04/2013   NA 138 02/06/2013   K 4.6 02/06/2013   CL 105 02/06/2013   CREATININE 1.10 02/06/2013   BUN 17 02/06/2013   CO2 23 02/06/2013   TSH 1.291 09/28/2009   INR 1.20 02/05/2013   HGBA1C 10.2* 02/04/2013   Stable day after cabg Walked around the unit well Foley out in am Thrombocytopenia Expected Acute  Blood - loss Anemia   Delight Ovens MD  Beeper [phone] Office [phone] 02/06/2013 6:07  PM

## 2013-02-06 NOTE — Op Note (Signed)
Juan [name], Juan [name] NO.:  [id number]  MEDICAL RECORD NO.:  [id number]  LOCATION:  2S07C                        FACILITY:  [facility]  PHYSICIAN:  Kerin Perna, M.D.  DATE OF BIRTH:  06-13-1965  DATE OF PROCEDURE:  02/05/2013 DATE OF DISCHARGE:                              OPERATIVE REPORT   OPERATION: 1. Coronary artery bypass grafting x4 (left internal mammary artery to     left anterior descending, left radial artery free graft to obtuse     marginal, saphenous vein graft to diagonal, saphenous vein graft to     posterior descending). 2. Endoscopic harvest of right leg greater saphenous vein. 3. Harvest of left radial artery for free graft.  SURGEON:  Kerin Perna, MD  ASSISTANTS:  Artist Pais. Mina Marble, MD and Coral Ceo, PA-C  PREOPERATIVE DIAGNOSES: 1. Severe three-vessel coronary artery disease. 2. Non-ST elevation myocardial infarction with unstable angina. 3. Poorly controlled diabetes.  POSTOPERATIVE DIAGNOSES: 1. Severe three-vessel coronary artery disease. 2. Non-ST elevation myocardial infarction with unstable angina. 3. Poorly controlled diabetes.  ANESTHESIA:  General by Dr. Laverle Hobby.  INDICATIONS:  The patient is a 47 year old diabetic with hyperlipidemia who presented with unstable angina and positive cardiac enzymes. Cardiac catheterization by his cardiologist demonstrated severe multivessel disease with a diabetic-type pattern.  There was some mild inferolateral hypokinesia, which was confirmed by 2D echocardiogram, which also showed no significant valvular disease.  The patient was felt to be candidate for surgical coronary revascularization.  I examined the patient in his hospital room and reviewed the results of his coronary arteriograms with the patient and family.  I discussed the indications and expected benefits of multivessel CABG for treatment of his CAD.  I discussed the major aspects of the surgery and operation  including location of the surgical incisions, use of general anesthesia and cardiopulmonary bypass, the expected postoperative hospital recovery, and the alternatives to surgical therapy for treatment of his severe three-vessel CAD in the setting of diabetes.  I had a detailed discussion with the patient and his family about the risks of surgery including the risks of bleeding requiring transfusion, the risks of MI and stroke, the risks of surgical wound infection, and the risks of death.  We also reviewed the sequela of surgery with possibilities of temporary ankle edema, temporary atrial arrhythmias, or atelectasis- pleural effusions, and the possibility of postoperativeanemia.  After reviewing these issues, he demonstrated his understanding and agreed to proceed with the surgery under what I felt was an informed consent.  OPERATIVE FINDINGS: 1. Diffusely diseased diabetic type vessels but adequate targets. 2. Good conduit, although the radial artery was somewhat small. 3. No packed cell transfusions required.  OPERATIVE PROCEDURE:  The patient was brought to the operating room and placed supine on the operating table.  General anesthesia was induced under invasive hemodynamic monitoring.  The chest, abdomen, and legs were prepped with Betadine and draped as a sterile field.  A sternal incision was made in the left radial artery.  An endoscopic vein harvest incision was made.  The mammary artery, left radial artery and saphenous vein were harvested simultaneously.  The radial artery was removed only  after confirming that there was adequate collateral flow through the hand after a vascular bulldog had been placed on the radial artery and heparin was administered at a dose of 3000 units.  The left arm incision was closed and then tucked back to the patient's side.  The mammary artery was a good vessel with excellent flow.  The sternal retractor was placed.  The pericardium was opened  and suspended. Heparin was administered and pursestrings were placed in the ascending aorta and right atrium.  The patient was cannulated after the ACT was documented as being therapeutic.  The coronary arteries were identified for grafting and the mammary artery, radial artery, and vein grafts were prepared for the distal anastomoses.  The patient was cooled to 32 degrees and cardioplegia cannula was placed for antegrade and retrograde cold blood cardioplegia.  The aortic crossclamp was then applied and 1 liter of cold blood cardioplegia was delivered in split doses between the antegrade aortic and retrograde coronary sinus catheters.  There was good cardioplegic arrest and septal temperature dropped less than 14 degrees.  Cardioplegia was delivered every 20 minutes.  The distal coronary anastomoses were performed.  First, distal anastomosis was to the posterior descending.  The distal RCA and branches of the distal RCA were heavily diseased and/or too small to graft.  The proximal posterior descending communicated well with the continuation of the distal RCA in the posterolateral branches so the anastomosis was placed there.  It was a 1.5-1.7-mm vessel with minimal disease.  A reverse saphenous vein was sewn end-to-side with running 7-0 Prolene with good flow through the graft.  The second distal anastomosis was to the diagonal branch to the LAD. This was a 1.5-mm vessel and a reverse saphenous vein was sewn end-to- side with running 7-0 Prolene with good flow through the graft. Cardioplegia was redosed.  The third distal anastomosis was to the OM branch of the left circumflex.  This had a proximal 90% stenosis.  The radial artery graft was sewn end-to-side with running 8-0 Prolene.  There was excellent flow through the graft.  Cardioplegia was redosed.  The fourth distal anastomosis was to the mid LAD where it was a 1.5-1.7- mm vessel.  The left IMA pedicle was brought through an  opening in the left lateral pericardium, was brought down onto the LAD and sewn end-to- side with running 8-0 Prolene.  There was good flow through the anastomosis after briefly releasing the pedicle bulldog on the mammary artery.  The bulldog was re-applied and the pedicle was secured to the epicardium.  Cardioplegia was redosed.  While the crossclamp was still in place, 3 proximal anastomoses were placed on the ascending aorta using a 4.0-mm punch and running 7-0 Prolene for the arterial graft and running 6-0 Prolene for the 2 venous grafts.  Air was vented from the coronaries with a dose of retrograde warm blood cardioplegia and the crossclamp was removed.  The heart resumed a spontaneous rhythm.  The vein grafts were de-aired and opened and each had good flow, and hemostasis was documented in the proximal and distal anastomoses.  The cardioplegia cannula was were removed.  The patient was rewarmed and reperfused.  Temporary pacing wires were applied.  The lungs re-expanded, and the ventilator was resumed.  When the patient was ready for transition off bypass, low-renal dose dopamine was started and the heart was filled.  The heart separated from cardiopulmonary bypass without difficulty.  The echo showed good LV function.  The cannula was  removed.  Protamine was administered without adverse reaction.  There was some diffuse coagulopathy possibly related to the patient's high dose of fish oil preoperatively and a liter of platelets was administered with improved coagulation function.  The superior pericardial fat was closed over the aorta and the anterior mediastinal and left pleural chest tube were placed and brought out through separate incisions.  The patient remained hemodynamically stable.  The sternum was closed with wire.  The pectoralis fascia was closed with a running #1 Vicryl.  The subcutaneous and skin layers were closed with running Vicryl and sterile dressings were  applied.  Total cardiopulmonary bypass time was 150 minutes.     Kerin Perna, M.D.     PV/MEDQ  D:  02/05/2013  T:  02/06/2013  Job:  [id number]  cc:   [name], M.D.

## 2013-02-07 ENCOUNTER — Inpatient Hospital Stay (HOSPITAL_COMMUNITY): Payer: Managed Care, Other (non HMO)

## 2013-02-07 DIAGNOSIS — D696 Thrombocytopenia, unspecified: Secondary | ICD-10-CM

## 2013-02-07 LAB — CBC
HCT: 27.9 % — ABNORMAL LOW (ref 39.0–52.0)
Hemoglobin: 9.4 g/dL — ABNORMAL LOW (ref 13.0–17.0)
MCH: 26.5 pg (ref 26.0–34.0)
MCHC: 33.7 g/dL (ref 30.0–36.0)
MCV: 78.6 fL (ref 78.0–100.0)
Platelets: 60 10*3/uL — ABNORMAL LOW (ref 150–400)
RBC: 3.55 MIL/uL — ABNORMAL LOW (ref 4.22–5.81)
RDW: 15 % (ref 11.5–15.5)
WBC: 7.7 10*3/uL (ref 4.0–10.5)

## 2013-02-07 LAB — GLUCOSE, CAPILLARY
Glucose-Capillary: 242 mg/dL — ABNORMAL HIGH (ref 70–99)
Glucose-Capillary: 264 mg/dL — ABNORMAL HIGH (ref 70–99)
Glucose-Capillary: 270 mg/dL — ABNORMAL HIGH (ref 70–99)
Glucose-Capillary: 286 mg/dL — ABNORMAL HIGH (ref 70–99)

## 2013-02-07 LAB — BASIC METABOLIC PANEL
BUN: 17 mg/dL (ref 6–23)
CO2: 26 mEq/L (ref 19–32)
Calcium: 10.2 mg/dL (ref 8.4–10.5)
Chloride: 100 mEq/L (ref 96–112)
Creatinine, Ser: 0.98 mg/dL (ref 0.50–1.35)
GFR calc Af Amer: >90 mL/min (ref >90)
GFR calc non Af Amer: >90 mL/min (ref >90)
Glucose, Bld: 224 mg/dL — ABNORMAL HIGH (ref 70–99)
Potassium: 4.8 mEq/L (ref 3.5–5.1)
Sodium: 133 mEq/L — ABNORMAL LOW (ref 135–145)

## 2013-02-07 MED ORDER — PANTOPRAZOLE SODIUM 40 MG PO TBEC
40.0000 mg | DELAYED_RELEASE_TABLET | Freq: Every day | ORAL | Status: DC
  Administered 2013-02-08 – 2013-02-10 (×3): 40 mg via ORAL
  Filled 2013-02-07 (×3): qty 1

## 2013-02-07 MED ORDER — DOCUSATE SODIUM 100 MG PO CAPS
200.0000 mg | ORAL_CAPSULE | Freq: Every day | ORAL | Status: DC
  Administered 2013-02-08 – 2013-02-10 (×3): 200 mg via ORAL
  Filled 2013-02-07 (×4): qty 2

## 2013-02-07 MED ORDER — METOPROLOL SUCCINATE ER 25 MG PO TB24
25.0000 mg | ORAL_TABLET | Freq: Every day | ORAL | Status: DC
  Filled 2013-02-07: qty 1

## 2013-02-07 MED ORDER — METFORMIN HCL 500 MG PO TABS
1000.0000 mg | ORAL_TABLET | Freq: Two times a day (BID) | ORAL | Status: DC
  Administered 2013-02-07 – 2013-02-10 (×6): 1000 mg via ORAL
  Filled 2013-02-07 (×9): qty 2

## 2013-02-07 MED ORDER — SODIUM CHLORIDE 0.9 % IJ SOLN: 3.0000 mL | INTRAMUSCULAR | Status: DC | PRN

## 2013-02-07 MED ORDER — MOVING RIGHT ALONG BOOK
Freq: Once | Status: AC
  Administered 2013-02-07: 10:00:00
  Filled 2013-02-07: qty 1

## 2013-02-07 MED ORDER — BISACODYL 5 MG PO TBEC
10.0000 mg | DELAYED_RELEASE_TABLET | Freq: Every day | ORAL | Status: DC | PRN
  Administered 2013-02-08 – 2013-02-10 (×2): 10 mg via ORAL
  Filled 2013-02-07 (×2): qty 2

## 2013-02-07 MED ORDER — OXYCODONE HCL 5 MG PO TABS
5.0000 mg | ORAL_TABLET | ORAL | Status: DC | PRN
  Administered 2013-02-07 (×3): 10 mg via ORAL
  Administered 2013-02-08: 5 mg via ORAL
  Administered 2013-02-08 – 2013-02-09 (×4): 10 mg via ORAL
  Filled 2013-02-07 (×8): qty 2

## 2013-02-07 MED ORDER — INSULIN ASPART 100 UNIT/ML ~~LOC~~ SOLN
0.0000 [IU] | Freq: Three times a day (TID) | SUBCUTANEOUS | Status: DC
  Administered 2013-02-07 (×2): 12 [IU] via SUBCUTANEOUS
  Administered 2013-02-07: 16 [IU] via SUBCUTANEOUS
  Administered 2013-02-08 (×2): 8 [IU] via SUBCUTANEOUS
  Administered 2013-02-08: 12 [IU] via SUBCUTANEOUS
  Administered 2013-02-08: 8 [IU] via SUBCUTANEOUS
  Administered 2013-02-09: 4 [IU] via SUBCUTANEOUS
  Administered 2013-02-09: 8 [IU] via SUBCUTANEOUS
  Administered 2013-02-09: 12 [IU] via SUBCUTANEOUS
  Administered 2013-02-09: 4 [IU] via SUBCUTANEOUS
  Administered 2013-02-10: 12 [IU] via SUBCUTANEOUS

## 2013-02-07 MED ORDER — RAMIPRIL 2.5 MG PO CAPS
2.5000 mg | ORAL_CAPSULE | Freq: Every day | ORAL | Status: DC
  Administered 2013-02-07 – 2013-02-10 (×4): 2.5 mg via ORAL
  Filled 2013-02-07 (×4): qty 1

## 2013-02-07 MED ORDER — ONDANSETRON HCL 4 MG PO TABS: 4.0000 mg | ORAL_TABLET | Freq: Four times a day (QID) | ORAL | Status: DC | PRN

## 2013-02-07 MED ORDER — BISACODYL 10 MG RE SUPP: 10.0000 mg | Freq: Every day | RECTAL | Status: DC | PRN

## 2013-02-07 MED ORDER — SODIUM CHLORIDE 0.9 % IV SOLN: 250.0000 mL | INTRAVENOUS | Status: DC | PRN

## 2013-02-07 MED ORDER — TRAMADOL HCL 50 MG PO TABS
50.0000 mg | ORAL_TABLET | ORAL | Status: DC | PRN
  Administered 2013-02-10: 100 mg via ORAL
  Filled 2013-02-07: qty 2

## 2013-02-07 MED ORDER — ONDANSETRON HCL 4 MG/2ML IJ SOLN: 4.0000 mg | Freq: Four times a day (QID) | INTRAMUSCULAR | Status: DC | PRN

## 2013-02-07 MED ORDER — ASPIRIN EC 325 MG PO TBEC
325.0000 mg | DELAYED_RELEASE_TABLET | Freq: Every day | ORAL | Status: DC
  Administered 2013-02-08 – 2013-02-10 (×3): 325 mg via ORAL
  Filled 2013-02-07 (×3): qty 1

## 2013-02-07 MED ORDER — SODIUM CHLORIDE 0.9 % IJ SOLN
3.0000 mL | Freq: Two times a day (BID) | INTRAMUSCULAR | Status: DC
  Administered 2013-02-07 – 2013-02-09 (×4): 3 mL via INTRAVENOUS

## 2013-02-07 NOTE — Progress Notes (Signed)
Pt transferred to 2W, room 23 via wheelchair. Pt tolerated transfer well. VSS. On RA. Pt's wife and daughter at bedside. Bedside handoff given with RN Fayrene Fearing. Only pt belongings transferred were eyeglasses (pt currently wearing). Will continue to monitor closely. Kerin Ransom, RN

## 2013-02-07 NOTE — Progress Notes (Signed)
Patient ID: Juan [name], male   DOB: 07/25/1965, 47 y.o.   MRN: [id number] [facility] DAILY ICU PROGRESS NOTE                   [address]            [place] [address]          [phone]   2 Days Post-Op Procedure(s) (LRB): CORONARY ARTERY BYPASS GRAFTING (CABG) (N/A) INTRAOPERATIVE TRANSESOPHAGEAL ECHOCARDIOGRAM (N/A) RADIAL ARTERY HARVEST (Left)  Total Length of Stay:  LOS: 4 days   Subjective: Feels better, ambulating around unit well  Objective: Vital signs in last 24 hours: Temp:  [98.1 F (36.7 C)-99.7 F (37.6 C)] 98.6 F (37 C) (11/22 0758) Pulse Rate:  [88-108] 91 (11/22 0800) Cardiac Rhythm:  [-] Normal sinus rhythm (11/22 0800) Resp:  [19-30] 26 (11/22 0800) BP: (104-137)/(60-74) 120/70 mmHg (11/22 0800) SpO2:  [90 %-100 %] 98 % (11/22 0800) Arterial Line BP: (59-67)/(55-63) 67/59 mmHg (11/21 0945) Weight:  [205 lb 11 oz (93.3 kg)] 205 lb 11 oz (93.3 kg) (11/22 0500)  Filed Weights   02/05/13 1500 02/06/13 0600 02/07/13 0500  Weight: 207 lb 7.3 oz (94.1 kg) 205 lb 11 oz (93.3 kg) 205 lb 11 oz (93.3 kg)    Weight change: -1 lb 12.2 oz (-0.8 kg)   Hemodynamic parameters for last 24 hours: PAP: (26-31)/(13-18) 27/16 mmHg CO:  [6.4 L/min] 6.4 L/min CI:  [3 L/min/m2] 3 L/min/m2  Intake/Output from previous day: 11/21 0701 - 11/22 0700 In: 2844.9 [P.O.:1700; I.V.:644.9; IV Piggyback:500] Out: 2955 [Urine:2815; Chest Tube:140]  Intake/Output this shift: Total I/O In: 20 [I.V.:20] Out: -   Current Meds: Scheduled Meds: . acetaminophen  1,000 mg Oral Q6H   Or  . acetaminophen (TYLENOL) oral liquid 160 mg/5 mL  1,000 mg Per Tube Q6H  . aspirin EC  325 mg Oral Daily   Or  . aspirin  324 mg Per Tube Daily  . bisacodyl  10 mg Oral Daily   Or  . bisacodyl  10 mg Rectal Daily  . budesonide-formoterol  2 puff Inhalation BID  . docusate sodium  200 mg Oral Daily  . fenofibrate  160 mg Oral q1800  . insulin aspart  0-24 Units Subcutaneous  Q4H  . insulin aspart  4 Units Subcutaneous TID WC  . insulin detemir  30 Units Subcutaneous BID  . insulin regular  0-10 Units Intravenous TID WC  . isosorbide mononitrate  15 mg Oral Daily  . metoprolol tartrate  12.5 mg Oral BID   Or  . metoprolol tartrate  12.5 mg Per Tube BID  . mupirocin ointment  1 application Nasal BID  . pantoprazole  40 mg Oral Daily  . sodium chloride  10-40 mL Intracatheter Q12H  . sodium chloride  3 mL Intravenous Q12H   Continuous Infusions: . sodium chloride 20 mL/hr at 02/07/13 0800  . sodium chloride 20 mL/hr at 02/05/13 1627  . sodium chloride    . insulin (NOVOLIN-R) infusion 10.9 Units/hr (02/06/13 0930)  . lactated ringers Stopped (02/06/13 1200)   PRN Meds:.levalbuterol, metoprolol, morphine injection, ondansetron (ZOFRAN) IV, oxyCODONE, sodium chloride, sodium chloride, traMADol  General appearance: alert and cooperative Neurologic: intact Heart: regular rate and rhythm, S1, S2 normal, no murmur, click, rub or gallop Lungs: clear to auscultation bilaterally Abdomen: soft, non-tender; bowel sounds normal; no masses,  no organomegaly Extremities: extremities normal, atraumatic, no cyanosis or edema and Homans sign is negative, no  sign of DVT Wound: sternum stable  Lab Results: CBC: Recent Labs  02/06/13 1600 02/06/13 1638 02/07/13 0420  WBC 7.9  --  7.7  HGB 9.8* 9.9* 9.4*  HCT 29.1* 29.0* 27.9*  PLT 74*  --  60*   BMET:  Recent Labs  02/06/13 0420  02/06/13 1638 02/07/13 0420  NA 139  --  138 133*  K 4.5  --  4.6 4.8  CL 107  --  105 100  CO2 23  --   --  26  GLUCOSE 132*  --  144* 224*  BUN 16  --  17 17  CREATININE 0.91  < > 1.10 0.98  CALCIUM 9.6  --   --  10.2  < > = values in this interval not displayed.  PT/INR:  Recent Labs  02/05/13 1458  LABPROT 14.9  INR 1.20   Radiology: Dg Chest Port 1 View  02/07/2013   CLINICAL DATA:  Status post CABG.  EXAM: PORTABLE CHEST - 1 VIEW  COMPARISON:  02/06/2013   FINDINGS: Swan-Ganz catheter, mediastinal drain and left chest tube have been removed. No pneumothorax, pulmonary edema or significant pleural fluid is identified. Lung volumes remain low with bibasilar atelectasis present. The heart size and mediastinal contours are stable.  IMPRESSION: No pneumothorax.  Low lung volumes with bibasilar atelectasis.   Electronically Signed   By: Irish Lack M.D.   On: 02/07/2013 08:09   Dg Chest Portable 1 View In Am  02/06/2013   CLINICAL DATA:  Coronary artery disease.  Status post CABG.  EXAM: PORTABLE CHEST - 1 VIEW  COMPARISON:  02/05/2013 and 02/02/2013  FINDINGS: Swan-Ganz catheter and chest tubes appear unchanged. Endotracheal tube and NG tube have been removed.  Minimal atelectasis or fluid at the right base has resolved. Left lung is clear. No pneumothorax.  IMPRESSION: Clear lungs.  No pneumothorax.   Electronically Signed   By: Geanie Cooley M.D.   On: 02/06/2013 07:51   Dg Chest Portable 1 View  02/05/2013   CLINICAL DATA:  CABG.  EXAM: PORTABLE CHEST - 1 VIEW  COMPARISON:  02/02/2013.  FINDINGS: Endotracheal tube noted projected 3 cm above the carina. NG tube noted coiled in stomach. Swan-Ganz catheter noted with tip projected over right pulmonary artery. Mediastinal drainage catheter noted. Left chest tube noted. No pneumothorax. CABG. No pulmonary venous congestion. Mild atelectasis lung bases. Density is noted projected over the right mid lung field. This could represent a region of atelectasis or could represent surgical padding, clinical correlation suggested and follow-up chest x-ray can be obtained.  IMPRESSION: 1. Prior CABG.  Good positioning of lines and tubes.  2. Density noted projected over the right mid chest, although this could represent atelectasis, surgical padding could present in this fashion, clinical correlation suggested and follow-up chest x-ray can be obtained if needed. These results were called by telephone at the time of  interpretation on 02/05/2013 at 3:04 PM to the patient's ICU nurse, who verbally acknowledged these results.   Electronically Signed   By: Maisie Fus  Register   On: 02/05/2013 15:08     Assessment/Plan: S/P Procedure(s) (LRB): CORONARY ARTERY BYPASS GRAFTING (CABG) (N/A) INTRAOPERATIVE TRANSESOPHAGEAL ECHOCARDIOGRAM (N/A) RADIAL ARTERY HARVEST (Left) Mobilize Diuresis Diabetes control Plan for transfer to step-down: see transfer orders Continued thrombocytopenia, avoid heparin    Danaysia Rader B 02/07/2013 8:45 AM

## 2013-02-07 NOTE — Progress Notes (Signed)
Subjective:  POD #2 CABG X4. Looks great!!! No CP/SOB  Objective:  Temp:  [98.1 F (36.7 C)-99.2 F (37.3 C)] 98.6 F (37 C) (11/22 0758) Pulse Rate:  [91-108] 97 (11/22 0900) Resp:  [20-30] 22 (11/22 1000) BP: (104-137)/(60-73) 132/72 mmHg (11/22 0900) SpO2:  [90 %-100 %] 94 % (11/22 0900) Weight:  [205 lb 11 oz (93.3 kg)] 205 lb 11 oz (93.3 kg) (11/22 0500) Weight change: -1 lb 12.2 oz (-0.8 kg)  Intake/Output from previous day: 11/21 0701 - 11/22 0700 In: 2844.9 [P.O.:1700; I.V.:644.9; IV Piggyback:500] Out: 2955 [Urine:2815; Chest Tube:140]  Intake/Output from this shift: Total I/O In: 420 [P.O.:360; I.V.:60] Out: -   Physical Exam: General appearance: alert and no distress Neck: no adenopathy, no carotid bruit, no JVD, supple, symmetrical, trachea midline and thyroid not enlarged, symmetric, no tenderness/mass/nodules Lungs: Rales Right base Heart: regular rate and rhythm, S1, S2 normal, no murmur, click, rub or gallop Extremities: extremities normal, atraumatic, no cyanosis or edema  Lab Results: Results for orders placed during the hospital encounter of 02/03/13 (from the past 48 hour(s))  POCT I-STAT 4, (NA,K, GLUC, HGB,HCT)     Status: Abnormal   Collection Time    02/05/13 12:07 PM      Result Value Range   Sodium 135  135 - 145 mEq/L   Potassium 4.6  3.5 - 5.1 mEq/L   Glucose, Bld 205 (*) 70 - 99 mg/dL   HCT 16.1 (*) 09.6 - 04.5 %   Hemoglobin 10.2 (*) 13.0 - 17.0 g/dL  HEMOGLOBIN AND HEMATOCRIT, BLOOD     Status: Abnormal   Collection Time    02/05/13 12:10 PM      Result Value Range   Hemoglobin 10.3 (*) 13.0 - 17.0 g/dL   Comment: REPEATED TO VERIFY     RESULT CALLED TO, READ BACK BY AND VERIFIED WITH:     WEATHERLY,P RN AT 1230 11.20.2014 BY LINEBERRY,R   HCT 29.0 (*) 39.0 - 52.0 %   Comment: RESULT CALLED TO, READ BACK BY AND VERIFIED WITH:     WEATHERLY,P RN AT 1230 11.20.2014 BY LINEBERRY,R  PLATELET COUNT     Status: Abnormal   Collection Time    02/05/13 12:10 PM      Result Value Range   Platelets 125 (*) 150 - 400 K/uL   Comment: RESULT CALLED TO, READ BACK BY AND VERIFIED WITH:     WEATHERLY,P RN AT 1230 11.20.2014 BY LINEBERRY,R  PREPARE PLATELET PHERESIS     Status: None   Collection Time    02/05/13 12:12 PM      Result Value Range   Unit Number [id number]     Blood Component Type PLTPHER LR2     Unit division 00     Status of Unit ISSUED,FINAL     Transfusion Status OK TO TRANSFUSE    POCT I-STAT 4, (NA,K, GLUC, HGB,HCT)     Status: Abnormal   Collection Time    02/05/13  1:04 PM      Result Value Range   Sodium 136  135 - 145 mEq/L   Potassium 5.7 (*) 3.5 - 5.1 mEq/L   Glucose, Bld 265 (*) 70 - 99 mg/dL   HCT 56.2 (*) 13.0 - 86.5 %   Hemoglobin 10.5 (*) 13.0 - 17.0 g/dL  POCT I-STAT 4, (NA,K, GLUC, HGB,HCT)     Status: Abnormal   Collection Time    02/05/13  1:29 PM  Result Value Range   Sodium 138  135 - 145 mEq/L   Potassium 4.6  3.5 - 5.1 mEq/L   Glucose, Bld 268 (*) 70 - 99 mg/dL   HCT 96.0 (*) 45.4 - 09.8 %   Hemoglobin 9.5 (*) 13.0 - 17.0 g/dL  POCT I-STAT 3, BLOOD GAS (G3+)     Status: Abnormal   Collection Time    02/05/13  1:32 PM      Result Value Range   pH, Arterial 7.274 (*) 7.350 - 7.450   pCO2 arterial 49.9 (*) 35.0 - 45.0 mmHg   pO2, Arterial 59.0 (*) 80.0 - 100.0 mmHg   Bicarbonate 23.1  20.0 - 24.0 mEq/L   TCO2 25  0 - 100 mmol/L   O2 Saturation 86.0     Acid-base deficit 4.0 (*) 0.0 - 2.0 mmol/L   Sample type ARTERIAL    POCT I-STAT 3, BLOOD GAS (G3+)     Status: Abnormal   Collection Time    02/05/13  2:56 PM      Result Value Range   pH, Arterial 7.313 (*) 7.350 - 7.450   pCO2 arterial 51.2 (*) 35.0 - 45.0 mmHg   pO2, Arterial 89.0  80.0 - 100.0 mmHg   Bicarbonate 25.9 (*) 20.0 - 24.0 mEq/L   TCO2 27  0 - 100 mmol/L   O2 Saturation 96.0     Acid-base deficit 1.0  0.0 - 2.0 mmol/L   Patient temperature 37.3 C     Collection site ARTERIAL LINE      Drawn by Nurse     Sample type ARTERIAL    CBC     Status: Abnormal   Collection Time    02/05/13  2:58 PM      Result Value Range   WBC 8.9  4.0 - 10.5 K/uL   RBC 4.30  4.22 - 5.81 MIL/uL   Hemoglobin 11.3 (*) 13.0 - 17.0 g/dL   HCT 11.9 (*) 14.7 - 82.9 %   MCV 75.6 (*) 78.0 - 100.0 fL   MCH 26.3  26.0 - 34.0 pg   MCHC 34.8  30.0 - 36.0 g/dL   RDW 56.2  13.0 - 86.5 %   Platelets 97 (*) 150 - 400 K/uL   Comment: PLATELET COUNT CONFIRMED BY SMEAR     REPEATED TO VERIFY  PROTIME-INR     Status: None   Collection Time    02/05/13  2:58 PM      Result Value Range   Prothrombin Time 14.9  11.6 - 15.2 seconds   INR 1.20  0.00 - 1.49  APTT     Status: Abnormal   Collection Time    02/05/13  2:58 PM      Result Value Range   aPTT 23 (*) 24 - 37 seconds  POCT I-STAT 4, (NA,K, GLUC, HGB,HCT)     Status: Abnormal   Collection Time    02/05/13  3:03 PM      Result Value Range   Sodium 139  135 - 145 mEq/L   Potassium 4.3  3.5 - 5.1 mEq/L   Glucose, Bld 183 (*) 70 - 99 mg/dL   HCT 78.4 (*) 69.6 - 29.5 %   Hemoglobin 10.9 (*) 13.0 - 17.0 g/dL  GLUCOSE, CAPILLARY     Status: Abnormal   Collection Time    02/05/13  4:20 PM      Result Value Range   Glucose-Capillary 109 (*) 70 - 99 mg/dL  Comment 1 Documented in Chart     Comment 2 Notify RN     Comment 3 Glucose Stabilizer    GLUCOSE, CAPILLARY     Status: Abnormal   Collection Time    02/05/13  5:23 PM      Result Value Range   Glucose-Capillary 116 (*) 70 - 99 mg/dL   Comment 1 Documented in Chart     Comment 2 Notify RN     Comment 3 Glucose Stabilizer    GLUCOSE, CAPILLARY     Status: Abnormal   Collection Time    02/05/13  6:26 PM      Result Value Range   Glucose-Capillary 116 (*) 70 - 99 mg/dL   Comment 1 Documented in Chart     Comment 2 Notify RN     Comment 3 Glucose Stabilizer    POCT I-STAT 3, BLOOD GAS (G3+)     Status: Abnormal   Collection Time    02/05/13  6:48 PM      Result Value Range   pH,  Arterial 7.364  7.350 - 7.450   pCO2 arterial 42.8  35.0 - 45.0 mmHg   pO2, Arterial 108.0 (*) 80.0 - 100.0 mmHg   Bicarbonate 24.1 (*) 20.0 - 24.0 mEq/L   TCO2 25  0 - 100 mmol/L   O2 Saturation 98.0     Acid-base deficit 1.0  0.0 - 2.0 mmol/L   Patient temperature 38.6 C     Collection site ARTERIAL LINE     Drawn by Nurse     Sample type ARTERIAL    GLUCOSE, CAPILLARY     Status: Abnormal   Collection Time    02/05/13  7:38 PM      Result Value Range   Glucose-Capillary 108 (*) 70 - 99 mg/dL   Comment 1 Documented in Chart     Comment 2 Notify RN     Comment 3 Glucose Stabilizer    CBC     Status: Abnormal   Collection Time    02/05/13  8:40 PM      Result Value Range   WBC 10.1  4.0 - 10.5 K/uL   RBC 4.33  4.22 - 5.81 MIL/uL   Hemoglobin 11.4 (*) 13.0 - 17.0 g/dL   HCT 16.1 (*) 09.6 - 04.5 %   MCV 74.8 (*) 78.0 - 100.0 fL   MCH 26.3  26.0 - 34.0 pg   MCHC 35.2  30.0 - 36.0 g/dL   RDW 40.9  81.1 - 91.4 %   Platelets REPEATED TO VERIFY  150 - 400 K/uL   Comment: CONSISTENT WITH PREVIOUS RESULT  CREATININE, SERUM     Status: None   Collection Time    02/05/13  8:40 PM      Result Value Range   Creatinine, Ser 0.99  0.50 - 1.35 mg/dL   GFR calc non Af Amer >90  >90 mL/min   GFR calc Af Amer >90  >90 mL/min   Comment: (NOTE)     The eGFR has been calculated using the CKD EPI equation.     This calculation has not been validated in all clinical situations.     eGFR's persistently <90 mL/min signify possible Chronic Kidney     Disease.  MAGNESIUM     Status: Abnormal   Collection Time    02/05/13  8:40 PM      Result Value Range   Magnesium 2.8 (*) 1.5 - 2.5 mg/dL  GLUCOSE, CAPILLARY  Status: Abnormal   Collection Time    02/05/13  8:55 PM      Result Value Range   Glucose-Capillary 156 (*) 70 - 99 mg/dL  POCT I-STAT 3, BLOOD GAS (G3+)     Status: None   Collection Time    02/05/13  8:58 PM      Result Value Range   pH, Arterial 7.351  7.350 - 7.450   pCO2  arterial 43.4  35.0 - 45.0 mmHg   pO2, Arterial 98.0  80.0 - 100.0 mmHg   Bicarbonate 23.7  20.0 - 24.0 mEq/L   TCO2 25  0 - 100 mmol/L   O2 Saturation 97.0     Acid-base deficit 1.0  0.0 - 2.0 mmol/L   Patient temperature 38.4 C     Collection site ARTERIAL LINE     Drawn by Nurse     Sample type ARTERIAL    POCT I-STAT, CHEM 8     Status: Abnormal   Collection Time    02/05/13  9:02 PM      Result Value Range   Sodium 140  135 - 145 mEq/L   Potassium 4.8  3.5 - 5.1 mEq/L   Chloride 106  96 - 112 mEq/L   BUN 19  6 - 23 mg/dL   Creatinine, Ser 1.61  0.50 - 1.35 mg/dL   Glucose, Bld 096 (*) 70 - 99 mg/dL   Calcium, Ion 0.45 (*) 1.12 - 1.23 mmol/L   TCO2 23  0 - 100 mmol/L   Hemoglobin 11.9 (*) 13.0 - 17.0 g/dL   HCT 40.9 (*) 81.1 - 91.4 %  GLUCOSE, CAPILLARY     Status: Abnormal   Collection Time    02/05/13 10:17 PM      Result Value Range   Glucose-Capillary 138 (*) 70 - 99 mg/dL  GLUCOSE, CAPILLARY     Status: Abnormal   Collection Time    02/05/13 11:28 PM      Result Value Range   Glucose-Capillary 149 (*) 70 - 99 mg/dL  GLUCOSE, CAPILLARY     Status: Abnormal   Collection Time    02/06/13 12:45 AM      Result Value Range   Glucose-Capillary 133 (*) 70 - 99 mg/dL  GLUCOSE, CAPILLARY     Status: Abnormal   Collection Time    02/06/13  1:50 AM      Result Value Range   Glucose-Capillary 142 (*) 70 - 99 mg/dL  GLUCOSE, CAPILLARY     Status: Abnormal   Collection Time    02/06/13  2:57 AM      Result Value Range   Glucose-Capillary 117 (*) 70 - 99 mg/dL  GLUCOSE, CAPILLARY     Status: Abnormal   Collection Time    02/06/13  4:14 AM      Result Value Range   Glucose-Capillary 127 (*) 70 - 99 mg/dL  CBC     Status: Abnormal   Collection Time    02/06/13  4:20 AM      Result Value Range   WBC 9.4  4.0 - 10.5 K/uL   RBC 4.06 (*) 4.22 - 5.81 MIL/uL   Hemoglobin 10.7 (*) 13.0 - 17.0 g/dL   HCT 78.2 (*) 95.6 - 21.3 %   MCV 76.6 (*) 78.0 - 100.0 fL   MCH 26.4   26.0 - 34.0 pg   MCHC 34.4  30.0 - 36.0 g/dL   RDW 08.6  57.8 - 46.9 %  Platelets 107 (*) 150 - 400 K/uL   Comment: CONSISTENT WITH PREVIOUS RESULT  BASIC METABOLIC PANEL     Status: Abnormal   Collection Time    02/06/13  4:20 AM      Result Value Range   Sodium 139  135 - 145 mEq/L   Potassium 4.5  3.5 - 5.1 mEq/L   Chloride 107  96 - 112 mEq/L   CO2 23  19 - 32 mEq/L   Glucose, Bld 132 (*) 70 - 99 mg/dL   BUN 16  6 - 23 mg/dL   Creatinine, Ser 1.61  0.50 - 1.35 mg/dL   Calcium 9.6  8.4 - 09.6 mg/dL   GFR calc non Af Amer >90  >90 mL/min   GFR calc Af Amer >90  >90 mL/min   Comment: (NOTE)     The eGFR has been calculated using the CKD EPI equation.     This calculation has not been validated in all clinical situations.     eGFR's persistently <90 mL/min signify possible Chronic Kidney     Disease.  MAGNESIUM     Status: None   Collection Time    02/06/13  4:20 AM      Result Value Range   Magnesium 2.5  1.5 - 2.5 mg/dL  GLUCOSE, CAPILLARY     Status: Abnormal   Collection Time    02/06/13  5:23 AM      Result Value Range   Glucose-Capillary 116 (*) 70 - 99 mg/dL  GLUCOSE, CAPILLARY     Status: Abnormal   Collection Time    02/06/13  6:27 AM      Result Value Range   Glucose-Capillary 142 (*) 70 - 99 mg/dL  GLUCOSE, CAPILLARY     Status: Abnormal   Collection Time    02/06/13  7:36 AM      Result Value Range   Glucose-Capillary 129 (*) 70 - 99 mg/dL  GLUCOSE, CAPILLARY     Status: Abnormal   Collection Time    02/06/13  8:31 AM      Result Value Range   Glucose-Capillary 136 (*) 70 - 99 mg/dL  GLUCOSE, CAPILLARY     Status: Abnormal   Collection Time    02/06/13  9:29 AM      Result Value Range   Glucose-Capillary 144 (*) 70 - 99 mg/dL  GLUCOSE, CAPILLARY     Status: Abnormal   Collection Time    02/06/13 10:32 AM      Result Value Range   Glucose-Capillary 125 (*) 70 - 99 mg/dL  GLUCOSE, CAPILLARY     Status: Abnormal   Collection Time    02/06/13  11:41 AM      Result Value Range   Glucose-Capillary 119 (*) 70 - 99 mg/dL  GLUCOSE, CAPILLARY     Status: Abnormal   Collection Time    02/06/13 12:30 PM      Result Value Range   Glucose-Capillary 128 (*) 70 - 99 mg/dL  GLUCOSE, CAPILLARY     Status: Abnormal   Collection Time    02/06/13  1:45 PM      Result Value Range   Glucose-Capillary 118 (*) 70 - 99 mg/dL  GLUCOSE, CAPILLARY     Status: Abnormal   Collection Time    02/06/13  2:25 PM      Result Value Range   Glucose-Capillary 135 (*) 70 - 99 mg/dL  GLUCOSE, CAPILLARY     Status: Abnormal  Collection Time    02/06/13  3:46 PM      Result Value Range   Glucose-Capillary 145 (*) 70 - 99 mg/dL  MAGNESIUM     Status: None   Collection Time    02/06/13  4:00 PM      Result Value Range   Magnesium 2.4  1.5 - 2.5 mg/dL  CBC     Status: Abnormal   Collection Time    02/06/13  4:00 PM      Result Value Range   WBC 7.9  4.0 - 10.5 K/uL   RBC 3.72 (*) 4.22 - 5.81 MIL/uL   Hemoglobin 9.8 (*) 13.0 - 17.0 g/dL   HCT 10.2 (*) 72.5 - 36.6 %   MCV 78.2  78.0 - 100.0 fL   MCH 26.3  26.0 - 34.0 pg   MCHC 33.7  30.0 - 36.0 g/dL   RDW 44.0  34.7 - 42.5 %   Platelets 74 (*) 150 - 400 K/uL   Comment: CONSISTENT WITH PREVIOUS RESULT     DELTA CHECK NOTED     REPEATED TO VERIFY  CREATININE, SERUM     Status: Abnormal   Collection Time    02/06/13  4:00 PM      Result Value Range   Creatinine, Ser 1.16  0.50 - 1.35 mg/dL   GFR calc non Af Amer 74 (*) >90 mL/min   GFR calc Af Amer 86 (*) >90 mL/min   Comment: (NOTE)     The eGFR has been calculated using the CKD EPI equation.     This calculation has not been validated in all clinical situations.     eGFR's persistently <90 mL/min signify possible Chronic Kidney     Disease.  POCT I-STAT, CHEM 8     Status: Abnormal   Collection Time    02/06/13  4:38 PM      Result Value Range   Sodium 138  135 - 145 mEq/L   Potassium 4.6  3.5 - 5.1 mEq/L   Chloride 105  96 - 112 mEq/L     BUN 17  6 - 23 mg/dL   Creatinine, Ser 9.56  0.50 - 1.35 mg/dL   Glucose, Bld 387 (*) 70 - 99 mg/dL   Calcium, Ion 5.64 (*) 1.12 - 1.23 mmol/L   TCO2 25  0 - 100 mmol/L   Hemoglobin 9.9 (*) 13.0 - 17.0 g/dL   HCT 33.2 (*) 95.1 - 88.4 %  GLUCOSE, CAPILLARY     Status: Abnormal   Collection Time    02/06/13  7:47 PM      Result Value Range   Glucose-Capillary 311 (*) 70 - 99 mg/dL   Comment 1 Documented in Chart     Comment 2 Notify RN    GLUCOSE, CAPILLARY     Status: Abnormal   Collection Time    02/07/13 12:20 AM      Result Value Range   Glucose-Capillary 270 (*) 70 - 99 mg/dL   Comment 1 Documented in Chart     Comment 2 Notify RN    BASIC METABOLIC PANEL     Status: Abnormal   Collection Time    02/07/13  4:20 AM      Result Value Range   Sodium 133 (*) 135 - 145 mEq/L   Potassium 4.8  3.5 - 5.1 mEq/L   Chloride 100  96 - 112 mEq/L   CO2 26  19 - 32 mEq/L   Glucose, Bld  224 (*) 70 - 99 mg/dL   BUN 17  6 - 23 mg/dL   Creatinine, Ser 1.61  0.50 - 1.35 mg/dL   Calcium 09.6  8.4 - 04.5 mg/dL   GFR calc non Af Amer >90  >90 mL/min   GFR calc Af Amer >90  >90 mL/min   Comment: (NOTE)     The eGFR has been calculated using the CKD EPI equation.     This calculation has not been validated in all clinical situations.     eGFR's persistently <90 mL/min signify possible Chronic Kidney     Disease.  CBC     Status: Abnormal   Collection Time    02/07/13  4:20 AM      Result Value Range   WBC 7.7  4.0 - 10.5 K/uL   RBC 3.55 (*) 4.22 - 5.81 MIL/uL   Hemoglobin 9.4 (*) 13.0 - 17.0 g/dL   HCT 40.9 (*) 81.1 - 91.4 %   MCV 78.6  78.0 - 100.0 fL   MCH 26.5  26.0 - 34.0 pg   MCHC 33.7  30.0 - 36.0 g/dL   RDW 78.2  95.6 - 21.3 %   Platelets 60 (*) 150 - 400 K/uL   Comment: CONSISTENT WITH PREVIOUS RESULT  GLUCOSE, CAPILLARY     Status: Abnormal   Collection Time    02/07/13  7:56 AM      Result Value Range   Glucose-Capillary 242 (*) 70 - 99 mg/dL   Comment 1 Documented  in Chart     Comment 2 Notify RN    GLUCOSE, CAPILLARY     Status: Abnormal   Collection Time    02/07/13 11:26 AM      Result Value Range   Glucose-Capillary 264 (*) 70 - 99 mg/dL    Imaging: Imaging results have been reviewed  Assessment/Plan:   1. Principal Problem: 2.   CAD (coronary artery disease), LAD 90%, 1st diag 95%, LCX 70% wth AV groove 90%, RCA 40-50% mid and 80% long distal stenosis 02/03/13 3. Active Problems: 4.   Hyperlipidemia 5.   DM2 (diabetes mellitus, type 2) 6.   Abnormal cardiovascular stress test 7.   S/P CABG x 4, 02/05/13 LIMA-LAD; LT. RADIAL-OM;VG-DIAG; VG-PDA 8.   Time Spent Directly with Patient:  20 minutes  Length of Stay:  LOS: 4 days   Looks great POD #2 CABG X4. Ambulating. NSR. Exam benign. Can titrate BB. Labs remarkable for decreased Plts (agree avoid hep), increased Serum Glu. HgbA1C 10!!! Needs better diabetes control. Nl progression per TCTS. Hopefully home before Thanksgiving.  Runell Gess 02/07/2013, 11:41 AM

## 2013-02-07 NOTE — Plan of Care (Signed)
Problem: Phase III Progression Outcomes Goal: Time patient transferred to PCTU/Telemetry POD Outcome: Completed/Met Date Met:  02/07/13 Transferred 02/07/13 at 1045.

## 2013-02-07 NOTE — Progress Notes (Signed)
CARDIAC REHAB PHASE I   PRE:  Rate/Rhythm: ST 102  BP:  Supine:   Sitting: 130/66  Standing:    SaO2: 85 RA awaken from sleeping improved to 97 with deep breathing  MODE:  Ambulation: 200 ft x 1 standing rest break   POST:  Rate/Rhythm: 112  BP:  Supine:   Sitting: 134/70  Standing:    SaO2: 92  Ambulated x 1 assist and use of RW 200 feet.  Pt tolerated well with no complaints.  Pt to chair, call bell in place.  Pt denies any needs at this time.  Advised pt to ambulate at least 2 additional times today.  Pt verbalized understanding and is in agreement.  Alanson Aly, BSN  [phone]

## 2013-02-08 ENCOUNTER — Inpatient Hospital Stay (HOSPITAL_COMMUNITY): Payer: Managed Care, Other (non HMO)

## 2013-02-08 LAB — TYPE AND SCREEN
ABO/RH(D): AB NEG
Antibody Screen: POSITIVE
DAT, IgG: NEGATIVE
Unit division: 0
Unit division: 0
Unit division: 0
Unit division: 0
Unit division: 0
Unit division: 0

## 2013-02-08 LAB — BASIC METABOLIC PANEL
BUN: 24 mg/dL — ABNORMAL HIGH (ref 6–23)
CO2: 25 mEq/L (ref 19–32)
Calcium: 10.7 mg/dL — ABNORMAL HIGH (ref 8.4–10.5)
Chloride: 99 mEq/L (ref 96–112)
Creatinine, Ser: 0.96 mg/dL (ref 0.50–1.35)
GFR calc Af Amer: >90 mL/min (ref >90)
GFR calc non Af Amer: >90 mL/min (ref >90)
Glucose, Bld: 250 mg/dL — ABNORMAL HIGH (ref 70–99)
Potassium: 4.8 mEq/L (ref 3.5–5.1)
Sodium: 132 mEq/L — ABNORMAL LOW (ref 135–145)

## 2013-02-08 LAB — GLUCOSE, CAPILLARY
Glucose-Capillary: 212 mg/dL — ABNORMAL HIGH (ref 70–99)
Glucose-Capillary: 238 mg/dL — ABNORMAL HIGH (ref 70–99)
Glucose-Capillary: 225 mg/dL — ABNORMAL HIGH (ref 70–99)

## 2013-02-08 LAB — CBC
HCT: 26.7 % — ABNORMAL LOW (ref 39.0–52.0)
Hemoglobin: 9 g/dL — ABNORMAL LOW (ref 13.0–17.0)
MCH: 26.5 pg (ref 26.0–34.0)
MCHC: 33.7 g/dL (ref 30.0–36.0)
MCV: 78.5 fL (ref 78.0–100.0)
Platelets: 91 10*3/uL — ABNORMAL LOW (ref 150–400)
RBC: 3.4 MIL/uL — ABNORMAL LOW (ref 4.22–5.81)
RDW: 14.8 % (ref 11.5–15.5)
WBC: 8.6 10*3/uL (ref 4.0–10.5)

## 2013-02-08 MED ORDER — SIMVASTATIN 10 MG PO TABS
10.0000 mg | ORAL_TABLET | Freq: Every day | ORAL | Status: DC
  Administered 2013-02-08 – 2013-02-09 (×2): 10 mg via ORAL
  Filled 2013-02-08 (×3): qty 1

## 2013-02-08 MED ORDER — METOPROLOL TARTRATE 25 MG PO TABS
25.0000 mg | ORAL_TABLET | Freq: Two times a day (BID) | ORAL | Status: DC
  Administered 2013-02-08 – 2013-02-10 (×5): 25 mg via ORAL
  Filled 2013-02-08 (×6): qty 1

## 2013-02-08 MED ORDER — INSULIN ASPART 100 UNIT/ML ~~LOC~~ SOLN
8.0000 [IU] | Freq: Three times a day (TID) | SUBCUTANEOUS | Status: DC
  Administered 2013-02-08 – 2013-02-09 (×3): 8 [IU] via SUBCUTANEOUS

## 2013-02-08 MED ORDER — LACTULOSE 10 GM/15ML PO SOLN
20.0000 g | Freq: Every day | ORAL | Status: DC | PRN
  Administered 2013-02-08 – 2013-02-09 (×2): 20 g via ORAL
  Filled 2013-02-08 (×2): qty 30

## 2013-02-08 MED ORDER — INSULIN DETEMIR 100 UNIT/ML ~~LOC~~ SOLN
35.0000 [IU] | Freq: Two times a day (BID) | SUBCUTANEOUS | Status: DC
  Administered 2013-02-08: 35 [IU] via SUBCUTANEOUS
  Filled 2013-02-08 (×3): qty 0.35

## 2013-02-08 NOTE — Progress Notes (Signed)
Subjective:  No CP/SOB. Looks great POD #3 CABG X 4  Objective:  Temp:  [98.7 F (37.1 C)-100.1 F (37.8 C)] 98.8 F (37.1 C) (11/23 0607) Pulse Rate:  [98-101] 98 (11/23 0607) Resp:  [18-20] 18 (11/23 0607) BP: (110-136)/(68-73) 136/68 mmHg (11/23 0607) SpO2:  [91 %-94 %] 92 % (11/23 0607) Weight:  [203 lb 8 oz (92.307 kg)] 203 lb 8 oz (92.307 kg) (11/23 0541) Weight change: -2 lb 3 oz (-0.993 kg)  Intake/Output from previous day: 11/22 0701 - 11/23 0700 In: 900 [P.O.:840; I.V.:60] Out: 2800 [Urine:2800]  Intake/Output from this shift: Total I/O In: -  Out: 500 [Urine:500]  Physical Exam: General appearance: alert and no distress Neck: no adenopathy, no carotid bruit, no JVD, supple, symmetrical, trachea midline and thyroid not enlarged, symmetric, no tenderness/mass/nodules Lungs: clear to auscultation bilaterally Heart: regular rate and rhythm, S1, S2 normal, no murmur, click, rub or gallop Extremities: extremities normal, atraumatic, no cyanosis or edema  Lab Results: Results for orders placed during the hospital encounter of 02/03/13 (from the past 48 hour(s))  GLUCOSE, CAPILLARY     Status: Abnormal   Collection Time    02/06/13 11:41 AM      Result Value Range   Glucose-Capillary 119 (*) 70 - 99 mg/dL  GLUCOSE, CAPILLARY     Status: Abnormal   Collection Time    02/06/13 12:30 PM      Result Value Range   Glucose-Capillary 128 (*) 70 - 99 mg/dL  GLUCOSE, CAPILLARY     Status: Abnormal   Collection Time    02/06/13  1:45 PM      Result Value Range   Glucose-Capillary 118 (*) 70 - 99 mg/dL  GLUCOSE, CAPILLARY     Status: Abnormal   Collection Time    02/06/13  2:25 PM      Result Value Range   Glucose-Capillary 135 (*) 70 - 99 mg/dL  GLUCOSE, CAPILLARY     Status: Abnormal   Collection Time    02/06/13  3:46 PM      Result Value Range   Glucose-Capillary 145 (*) 70 - 99 mg/dL  MAGNESIUM     Status: None   Collection Time    02/06/13  4:00 PM        Result Value Range   Magnesium 2.4  1.5 - 2.5 mg/dL  CBC     Status: Abnormal   Collection Time    02/06/13  4:00 PM      Result Value Range   WBC 7.9  4.0 - 10.5 K/uL   RBC 3.72 (*) 4.22 - 5.81 MIL/uL   Hemoglobin 9.8 (*) 13.0 - 17.0 g/dL   HCT 16.1 (*) 09.6 - 04.5 %   MCV 78.2  78.0 - 100.0 fL   MCH 26.3  26.0 - 34.0 pg   MCHC 33.7  30.0 - 36.0 g/dL   RDW 40.9  81.1 - 91.4 %   Platelets 74 (*) 150 - 400 K/uL   Comment: CONSISTENT WITH PREVIOUS RESULT     DELTA CHECK NOTED     REPEATED TO VERIFY  CREATININE, SERUM     Status: Abnormal   Collection Time    02/06/13  4:00 PM      Result Value Range   Creatinine, Ser 1.16  0.50 - 1.35 mg/dL   GFR calc non Af Amer 74 (*) >90 mL/min   GFR calc Af Amer 86 (*) >90 mL/min   Comment: (NOTE)  The eGFR has been calculated using the CKD EPI equation.     This calculation has not been validated in all clinical situations.     eGFR's persistently <90 mL/min signify possible Chronic Kidney     Disease.  POCT I-STAT, CHEM 8     Status: Abnormal   Collection Time    02/06/13  4:38 PM      Result Value Range   Sodium 138  135 - 145 mEq/L   Potassium 4.6  3.5 - 5.1 mEq/L   Chloride 105  96 - 112 mEq/L   BUN 17  6 - 23 mg/dL   Creatinine, Ser 4.54  0.50 - 1.35 mg/dL   Glucose, Bld 098 (*) 70 - 99 mg/dL   Calcium, Ion 1.19 (*) 1.12 - 1.23 mmol/L   TCO2 25  0 - 100 mmol/L   Hemoglobin 9.9 (*) 13.0 - 17.0 g/dL   HCT 14.7 (*) 82.9 - 56.2 %  GLUCOSE, CAPILLARY     Status: Abnormal   Collection Time    02/06/13  7:47 PM      Result Value Range   Glucose-Capillary 311 (*) 70 - 99 mg/dL   Comment 1 Documented in Chart     Comment 2 Notify RN    GLUCOSE, CAPILLARY     Status: Abnormal   Collection Time    02/07/13 12:20 AM      Result Value Range   Glucose-Capillary 270 (*) 70 - 99 mg/dL   Comment 1 Documented in Chart     Comment 2 Notify RN    GLUCOSE, CAPILLARY     Status: Abnormal   Collection Time    02/07/13  4:11 AM       Result Value Range   Glucose-Capillary 212 (*) 70 - 99 mg/dL  BASIC METABOLIC PANEL     Status: Abnormal   Collection Time    02/07/13  4:20 AM      Result Value Range   Sodium 133 (*) 135 - 145 mEq/L   Potassium 4.8  3.5 - 5.1 mEq/L   Chloride 100  96 - 112 mEq/L   CO2 26  19 - 32 mEq/L   Glucose, Bld 224 (*) 70 - 99 mg/dL   BUN 17  6 - 23 mg/dL   Creatinine, Ser 1.30  0.50 - 1.35 mg/dL   Calcium 86.5  8.4 - 78.4 mg/dL   GFR calc non Af Amer >90  >90 mL/min   GFR calc Af Amer >90  >90 mL/min   Comment: (NOTE)     The eGFR has been calculated using the CKD EPI equation.     This calculation has not been validated in all clinical situations.     eGFR's persistently <90 mL/min signify possible Chronic Kidney     Disease.  CBC     Status: Abnormal   Collection Time    02/07/13  4:20 AM      Result Value Range   WBC 7.7  4.0 - 10.5 K/uL   RBC 3.55 (*) 4.22 - 5.81 MIL/uL   Hemoglobin 9.4 (*) 13.0 - 17.0 g/dL   HCT 69.6 (*) 29.5 - 28.4 %   MCV 78.6  78.0 - 100.0 fL   MCH 26.5  26.0 - 34.0 pg   MCHC 33.7  30.0 - 36.0 g/dL   RDW 13.2  44.0 - 10.2 %   Platelets 60 (*) 150 - 400 K/uL   Comment: CONSISTENT WITH PREVIOUS RESULT  GLUCOSE, CAPILLARY  Status: Abnormal   Collection Time    02/07/13  7:56 AM      Result Value Range   Glucose-Capillary 242 (*) 70 - 99 mg/dL   Comment 1 Documented in Chart     Comment 2 Notify RN    GLUCOSE, CAPILLARY     Status: Abnormal   Collection Time    02/07/13 11:26 AM      Result Value Range   Glucose-Capillary 264 (*) 70 - 99 mg/dL  GLUCOSE, CAPILLARY     Status: Abnormal   Collection Time    02/07/13  4:39 PM      Result Value Range   Glucose-Capillary 329 (*) 70 - 99 mg/dL  GLUCOSE, CAPILLARY     Status: Abnormal   Collection Time    02/07/13  9:10 PM      Result Value Range   Glucose-Capillary 286 (*) 70 - 99 mg/dL   Comment 1 Documented in Chart     Comment 2 Notify RN    CBC     Status: Abnormal   Collection Time     02/08/13  5:23 AM      Result Value Range   WBC 8.6  4.0 - 10.5 K/uL   RBC 3.40 (*) 4.22 - 5.81 MIL/uL   Hemoglobin 9.0 (*) 13.0 - 17.0 g/dL   HCT 16.1 (*) 09.6 - 04.5 %   MCV 78.5  78.0 - 100.0 fL   MCH 26.5  26.0 - 34.0 pg   MCHC 33.7  30.0 - 36.0 g/dL   RDW 40.9  81.1 - 91.4 %   Platelets 91 (*) 150 - 400 K/uL   Comment: DELTA CHECK NOTED     POST TRANSFUSION SPECIMEN  BASIC METABOLIC PANEL     Status: Abnormal   Collection Time    02/08/13  5:23 AM      Result Value Range   Sodium 132 (*) 135 - 145 mEq/L   Potassium 4.8  3.5 - 5.1 mEq/L   Chloride 99  96 - 112 mEq/L   CO2 25  19 - 32 mEq/L   Glucose, Bld 250 (*) 70 - 99 mg/dL   BUN 24 (*) 6 - 23 mg/dL   Creatinine, Ser 7.82  0.50 - 1.35 mg/dL   Calcium 95.6 (*) 8.4 - 10.5 mg/dL   GFR calc non Af Amer >90  >90 mL/min   GFR calc Af Amer >90  >90 mL/min   Comment: (NOTE)     The eGFR has been calculated using the CKD EPI equation.     This calculation has not been validated in all clinical situations.     eGFR's persistently <90 mL/min signify possible Chronic Kidney     Disease.  GLUCOSE, CAPILLARY     Status: Abnormal   Collection Time    02/08/13  5:59 AM      Result Value Range   Glucose-Capillary 241 (*) 70 - 99 mg/dL    Imaging: Imaging results have been reviewed  Assessment/Plan:   1. Principal Problem: 2.   CAD (coronary artery disease), LAD 90%, 1st diag 95%, LCX 70% wth AV groove 90%, RCA 40-50% mid and 80% long distal stenosis 02/03/13 3. Active Problems: 4.   Hyperlipidemia 5.   DM2 (diabetes mellitus, type 2) 6.   Abnormal cardiovascular stress test 7.   S/P CABG x 4, 02/05/13 LIMA-LAD; LT. RADIAL-OM;VG-DIAG; VG-PDA 8.   Time Spent Directly with Patient:  15 minutes  Length of Stay:  LOS:  5 days   Looks great POD # 3 CABG X 4 . NSR. VSS. Exam benign. Labs OK. Ambulating w/o difficulty. Nl progression. D/C home early in wk per TCTS. [facility]. Tighter DM control.  Jillianna Stanek J 02/08/2013,  11:01 AM

## 2013-02-08 NOTE — Discharge Summary (Signed)
Physician Discharge Summary  Patient ID: Juan [name] MRN: [id number] DOB/AGE: [birth year] 46 y.o.  Admit date: 02/03/2013 Discharge date: 02/08/2013  Admission Diagnoses:  Patient Active Problem List   Diagnosis Date Noted  . Abnormal cardiovascular stress test 02/06/2013  . CAD (coronary artery disease), LAD 90%, 1st diag 95%, LCX 70% wth AV groove 90%, RCA 40-50% mid and 80% long distal stenosis 02/03/13 02/04/2013  . Coronary artery calcification seen on CAT scan 01/15/2013  . Other malignant lymphomas, unspecified site, extranodal and solid organ sites 08/25/2012  . Thrombocytopenia 06/17/2011  . Chronic pancreatitis 06/16/2011  . Hyperlipidemia 06/16/2011  . Hyperkalemia 06/16/2011  . Acute kidney injury 06/16/2011  . Dehydration 06/16/2011  . DM2 (diabetes mellitus, type 2) 06/16/2011  . Microcytic anemia 06/16/2011  . Fatty liver disease, nonalcoholic 06/16/2011  . Bilateral renal cysts 06/16/2011  . Splenomegaly 06/16/2011   Discharge Diagnoses:   Patient Active Problem List   Diagnosis Date Noted  . Abnormal cardiovascular stress test 02/06/2013  . S/P CABG x 4, 02/05/13 LIMA-LAD; LT. RADIAL-OM;VG-DIAG; VG-PDA 02/06/2013  . CAD (coronary artery disease), LAD 90%, 1st diag 95%, LCX 70% wth AV groove 90%, RCA 40-50% mid and 80% long distal stenosis 02/03/13 02/04/2013  . Coronary artery calcification seen on CAT scan 01/15/2013  . Other malignant lymphomas, unspecified site, extranodal and solid organ sites 08/25/2012  . Thrombocytopenia 06/17/2011  . Chronic pancreatitis 06/16/2011  . Hyperlipidemia 06/16/2011  . Hyperkalemia 06/16/2011  . Acute kidney injury 06/16/2011  . Dehydration 06/16/2011  . DM2 (diabetes mellitus, type 2) 06/16/2011  . Microcytic anemia 06/16/2011  . Fatty liver disease, nonalcoholic 06/16/2011  . Bilateral renal cysts 06/16/2011  . Splenomegaly 06/16/2011   Discharged Condition: good  History of Present Illness:   Juan [name] is a  47 yo white male with known history of uncontrolled diabetes and hyperlipidemia.  He has been routinely followed by his Cardiologist in the past.  He underwent Myoview study 2 years ago which non diagnostic and did not show evidence of ischemia.  The patient recently developed exertional chest pain that had been increasing in intensity and frequency.  It was felt patient should undergo cardiac catheterization on 02/03/2013 which revealed 3 vessel CAD which would be best treated with Coronary Bypass Grafting.  The patient was admitted and TCTS was consulted.   Hospital Course:    He was evaluated by Juan [name] who was in agreement patient would benefit from Coronary bypass procedure.  The risks and benefits of the procedure were explained to the patient and he was agreeable to proceed.  The patient was taken to the operating room on 02/05/2013.  He underwent CABG x 4 utilizing LIMA to LAD, SVG to Diagonal, SVG to PDA, and Radial Artery to the OM.  He also underwent Left Radial Artery harvest, and endoscopic saphenous vein harvest of his right thigh.  He tolerated the procedure well and was taken to the SICU in stable condition.  He was extubated the evening of surgery.  During his stay in the ICU the patient was weaned off all cardiac drips.  His chest tubes and arterial lines were removed without difficulty.  He had post operative thrombocytopenia which has been slowly improving.  He was maintaining NSR and transferred to the step down unit stable condition.  The patient is progressing.  His blood sugars have been elevated in the mid 200s to 300 range.  The patient's preoperative A1c was 10.  However, patient admitted to being non-compliant  at home.  He was educated on the importance of good blood sugar control with goal range of 150 or less.  He understands the risks and feels if he is taking his medications properly that he is well controlled.  He continues to maintain NSR and his pacing wires have been  removed without difficulty.  He is ambulating with minimal assistance and tolerating a heart health diet.  Should no further issues arise and his blood sugars become controlled we anticipate discharge home in the next 24-48 hours.  He will follow up with Juan [name] in 3 weeks with a CXR prior to his appointment.  He will also need to follow up with his primary cardiologist in 2-4 weeks.   Significant Diagnostic Studies: angiography:   HEMODYNAMICS:  AO SYSTOLIC/AO DIASTOLIC: 120/74  LV SYSTOLIC/LV DIASTOLIC: 115/15  ANGIOGRAPHIC RESULTS:  1. Left main; normal  2. LAD; 90% mid after moderate-sized first diagonal branch which itself had a long 95% ostial/proximal stenosis  3. Left circumflex; nondominant with a 70% segmental proximal AV groove followed by a 90% mid AV groove stenosis after the first marginal branch.  4. Right coronary artery; dominant with a 40-50% mid and 80% long segmental distal stenosis in the "crux" with an occluded PDA in the midportion. There were faint left to right PDA collaterals  5. Left ventriculography; RAO left ventriculogram was performed using  25 mL of Visipaque dye at 12 mL/second. The overall LVEF estimated  60 % Without wall motion abnormalities  Treatments: surgery:   1. Coronary artery bypass grafting x4 (left internal mammary artery to  left anterior descending, left radial artery free graft to obtuse  marginal, saphenous vein graft to diagonal, saphenous vein graft to  posterior descending).  2. Endoscopic harvest of right leg greater saphenous vein.  3. Harvest of left radial artery for free graft  Disposition: 01-Home or Self Care  Discharge Medications:   The patient has been discharged on:   Medication List    STOP taking these medications       amLODipine 5 MG tablet  Commonly known as:  NORVASC     metoprolol succinate 25 MG 24 hr tablet  Commonly known as:  TOPROL XL      TAKE these medications       aspirin 325 MG EC tablet   Take 1 tablet (325 mg total) by mouth daily.     fenofibrate 160 MG tablet  Take 160 mg by mouth daily. PM     HUMULIN R 500 UNIT/ML Soln injection  Generic drug:  insulin regular human CONCENTRATED  Inject 20-40 Units into the skin 2 (two) times daily with a meal. 20 units every morning and 40 units at bedtime     insulin NPH 100 UNIT/ML injection  Commonly known as:  HUMULIN N,NOVOLIN N  Inject 15 Units into the skin at bedtime.     INSULIN SYRINGE 1CC/31GX5/16" 31G X 5/16" 1 ML Misc     isosorbide mononitrate 15 mg Tb24 24 hr tablet  Commonly known as:  IMDUR  Take 0.5 tablets (15 mg total) by mouth daily.     metFORMIN 1000 MG tablet  Commonly known as:  GLUCOPHAGE  Take 1,000 mg by mouth 2 (two) times daily with a meal.     metoprolol tartrate 25 MG tablet  Commonly known as:  LOPRESSOR  Take 1 tablet (25 mg total) by mouth 2 (two) times daily.     ONE TOUCH ULTRA TEST test strip  Generic drug:  glucose blood     oxyCODONE 5 MG immediate release tablet  Commonly known as:  Oxy IR/ROXICODONE  Take 1-2 tablets (5-10 mg total) by mouth every 4 (four) hours as needed for severe pain.     ramipril 2.5 MG capsule  Commonly known as:  ALTACE  Take 2.5 mg by mouth daily.     simvastatin 10 MG tablet  Commonly known as:  ZOCOR  Take 1 tablet (10 mg total) by mouth daily at 6 PM.        1.Beta Blocker:  Yes [ x  ]                              No   [   ]                              If No, reason:  2.Ace Inhibitor/ARB: Yes [ x  ]                                     No  [    ]                                     If No, reason:  3.Statin:   Yes [ x  ]                  No  [   ]                  If No, reason:  4.Ecasa:  Yes  [x   ]                  No   [   ]                  If No, reason:      Future Appointments Provider Department Dept Phone   02/23/2013 8:00 AM [facility] Only [facility] [phone]   08/24/2013 8:30 AM  Delcie Roch [facility] [phone]   08/24/2013 9:00 AM [facility] Provider [facility] [phone]         Follow-up Information   Follow up with VAN Dinah Beers, MD. (Office will contact you with appointment)    Specialty:  Cardiothoracic Surgery   Contact information:   [address] [place] [address] [phone]       Follow up with  IMAGING. (please get CXR 1 hour prior to appointment with Juan [name])    Contact information:   [place]       Schedule an appointment as soon as possible for a visit with HARDING,Perrin W, MD. (please contact office to set up 2-4 week follow up)    Specialty:  Cardiology   Contact information:   [address] [place] [address] [phone]       Signed: Lowella Dandy 02/08/2013, 12:00 PM

## 2013-02-08 NOTE — Progress Notes (Signed)
Pt walked 5xtoday by him self 1500 ft.

## 2013-02-08 NOTE — Progress Notes (Signed)
Pacing wire dc'ed per unit protacall

## 2013-02-08 NOTE — Progress Notes (Addendum)
[address]       [place] [address]             [phone]      3 Days Post-Op Procedure(s) (LRB): CORONARY ARTERY BYPASS GRAFTING (CABG) (N/A) INTRAOPERATIVE TRANSESOPHAGEAL ECHOCARDIOGRAM (N/A) RADIAL ARTERY HARVEST (Left)  Subjective:  Mr. Januszewski has no complaints this morning.  His blood sugars remain elevated, and he states we are not giving him enough insulin.  + ambulation, No BM  Objective: Vital signs in last 24 hours: Temp:  [98.7 F (37.1 C)-100.1 F (37.8 C)] 98.8 F (37.1 C) (11/23 0607) Pulse Rate:  [97-101] 98 (11/23 0607) Cardiac Rhythm:  [-] Normal sinus rhythm (11/22 2000) Resp:  [18-27] 18 (11/23 0607) BP: (110-136)/(68-73) 136/68 mmHg (11/23 0607) SpO2:  [91 %-94 %] 92 % (11/23 0607) Weight:  [203 lb 8 oz (92.307 kg)] 203 lb 8 oz (92.307 kg) (11/23 0541)  Intake/Output from previous day: 11/22 0701 - 11/23 0700 In: 900 [P.O.:840; I.V.:60] Out: 2800 [Urine:2800]  General appearance: alert, cooperative and no distress Heart: regular rate and rhythm Lungs: clear to auscultation bilaterally Abdomen: soft, non-tender; bowel sounds normal; no masses,  no organomegaly Extremities: edema trace Wound: clean and dry  Lab Results:  Recent Labs  02/07/13 0420 02/08/13 0523  WBC 7.7 8.6  HGB 9.4* 9.0*  HCT 27.9* 26.7*  PLT 60* 91*   BMET:  Recent Labs  02/07/13 0420 02/08/13 0523  NA 133* 132*  K 4.8 4.8  CL 100 99  CO2 26 25  GLUCOSE 224* 250*  BUN 17 24*  CREATININE 0.98 0.96  CALCIUM 10.2 10.7*    PT/INR:  Recent Labs  02/05/13 1458  LABPROT 14.9  INR 1.20   ABG    Component Value Date/Time   PHART 7.351 02/05/2013 2058   HCO3 23.7 02/05/2013 2058   TCO2 25 02/06/2013 1638   ACIDBASEDEF 1.0 02/05/2013 2058   O2SAT 97.0 02/05/2013 2058   CBG (last 3)   Recent Labs  02/07/13 1639 02/07/13 2110 02/08/13 0559  GLUCAP 329* 286* 241*    Assessment/Plan: S/P Procedure(s) (LRB): CORONARY ARTERY  BYPASS GRAFTING (CABG) (N/A) INTRAOPERATIVE TRANSESOPHAGEAL ECHOCARDIOGRAM (N/A) RADIAL ARTERY HARVEST (Left)  1. CV- NSR mildly tachycardic, SBP 130's- will change lopressor to 25 mg BID, continue Altace, continue Imdur for radial artery graft 2. Pulm- no acute issues, continue Symbicort, encouarged IS 3. Renal- creatinine WNL, weight is below baseline, not on diuretic 4. LOC constipation- will order lactulose 5. DM- CBGS uncontrolled, will increase meal coverage to 8 units, if sugars remain elevated above 200 will increase Levemir to 35 units BID, however patient admitted to non-compliance at home and feels that is why his A1C was poor 6. Dispo- patient progressing, will d/c EPW, sugars need to be under better control prior to discharge, anticipate discharge for possibly Tuesday ? Statin?   LOS: 5 days    Lowella Dandy 02/08/2013  From last endocrnology appt  At [facility]: 01/2013: PROBLEM LIST:  Patient Active Problem List  Diagnosis  . Chylomicronemia syndrome  . NAFLD (nonalcoholic fatty liver disease) with cirrhosis by imaging studies  . Encounter for long-term (current) use of medications  . Diabetes type 2, uncontrolled  . H/O non-Hodgkin's lymphoma limited to tibia, treated at [facility] in 1990 and considered cured  . History of acute pancreatitis more than 10 hospitalizations since age 89   MEDICATIONS: Current Outpatient Prescriptions  Medication Sig Dispense Refill  . BD INSULIN PEN  NEEDLE UF SHORT 31 X 5/16 " needle  . BD INSULIN SYRINGE ULT-FINE II 1 mL 31 x 5/16" syringe  . fenofibrate 160 MG tablet daily.  Marland Kitchen HUMULIN N 100 unit/mL injection 10 units lunch and 15 units at bedtime  . HUMULIN R U-500 "CONCENTRATED" 500 unit/mL injection 10 units before breakfast and lunch , 30 units at supper  . insulin syringe-needle U-100 1/2 mL 31 x 5/16" syringe  . metFORMIN (GLUCOPHAGE) 1000 MG tablet 2 (two) times daily.  . ramipril (ALTACE) 2.5 MG capsule daily.     Lab Results    Component Value Date   HGBA1C 10.2* 02/04/2013    DM difficult to control Increase levemir On fenofibrate, Pateint notes his MD Dr Angelina Ok took him off statin. I have seen and examined Carl Best and agree with the above assessment  and plan.  Delight Ovens MD Beeper [phone] Office [phone] 02/08/2013 1:10 PM

## 2013-02-09 LAB — GLUCOSE, CAPILLARY
Glucose-Capillary: 193 mg/dL — ABNORMAL HIGH (ref 70–99)
Glucose-Capillary: 229 mg/dL — ABNORMAL HIGH (ref 70–99)
Glucose-Capillary: 197 mg/dL — ABNORMAL HIGH (ref 70–99)

## 2013-02-09 MED ORDER — INSULIN DETEMIR 100 UNIT/ML ~~LOC~~ SOLN
38.0000 [IU] | Freq: Two times a day (BID) | SUBCUTANEOUS | Status: DC
  Administered 2013-02-09 (×2): 38 [IU] via SUBCUTANEOUS
  Filled 2013-02-09 (×5): qty 0.38

## 2013-02-09 MED ORDER — INSULIN ASPART 100 UNIT/ML ~~LOC~~ SOLN
10.0000 [IU] | Freq: Three times a day (TID) | SUBCUTANEOUS | Status: DC
  Administered 2013-02-09 – 2013-02-10 (×3): 10 [IU] via SUBCUTANEOUS

## 2013-02-09 MED ORDER — LACTULOSE 10 GM/15ML PO SOLN
20.0000 g | Freq: Once | ORAL | Status: AC
  Filled 2013-02-09: qty 30

## 2013-02-09 NOTE — Progress Notes (Signed)
CARDIAC REHAB PHASE I   PRE:  Rate/Rhythm: 84 SR  BP:  Supine:   Sitting: 110/70  Standing:    SaO2: 95 RA  MODE:  Ambulation: 550 ft   POST:  Rate/Rhythm: 94 SR  BP:  Supine:   Sitting: 124/60  Standing:    SaO2: 97 RA 1115-1145 Pt tolerated ambulation well without c/o. He walked without walker and tolerated well. VS stable. Discussed Outpt. CRP with pt, he agrees to referral in [place].  Melina Copa RN 02/09/2013 11:42 AM
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
## 2013-02-09 NOTE — Care Management Note (Unsigned)
    Page 1 of 1   02/09/2013     4:30:13 PM   CARE MANAGEMENT NOTE 02/09/2013  Patient:  Juan [name], Juan [name]   Account Number:  [id number]  Date Initiated:  02/09/2013  Documentation initiated by:  Olevia Westervelt  Subjective/Objective Assessment:   PT S/P CABG X4 ON 02/05/13.  PTA, PT INDEPENDENT, LIVES WITH SPOUSE.     Action/Plan:   WILL FOLLOW FOR DC NEEDS AS PT PROGRESSES.   Anticipated DC Date:  02/10/2013   Anticipated DC Plan:  HOME/SELF CARE      DC Planning Services  CM consult      Choice offered to / List presented to:             Status of service:  In process, will continue to follow Medicare Important Message given?   (If response is "NO", the following Medicare IM given date fields will be blank) Date Medicare IM given:   Date Additional Medicare IM given:    Discharge Disposition:    Per UR Regulation:  Reviewed for med. necessity/level of care/duration of stay  If discussed at Long Length of Stay Meetings, dates discussed:    Comments:

## 2013-02-09 NOTE — Progress Notes (Signed)
Pt ambulating independently in hall with RW. 

## 2013-02-09 NOTE — Progress Notes (Addendum)
      [address]       [place] [address]             [phone]        4 Days Post-Op Procedure(s) (LRB): CORONARY ARTERY BYPASS GRAFTING (CABG) (N/A) INTRAOPERATIVE TRANSESOPHAGEAL ECHOCARDIOGRAM (N/A) RADIAL ARTERY HARVEST (Left)  Subjective: Patient states has not had a bowel movement yet. Otherwise, he feels ok.  Objective: Vital signs in last 24 hours: Temp:  [98.8 F (37.1 C)-98.9 F (37.2 C)] 98.9 F (37.2 C) (11/24 0433) Pulse Rate:  [78-91] 78 (11/24 0433) Cardiac Rhythm:  [-] Normal sinus rhythm (11/23 2015) Resp:  [19-21] 19 (11/24 0433) BP: (101-113)/(58-70) 102/62 mmHg (11/24 0433) SpO2:  [91 %-96 %] 96 % (11/24 0433) Weight:  [94.62 kg (208 lb 9.6 oz)] 94.62 kg (208 lb 9.6 oz) (11/24 0433)  Pre op weight  94.1 kg Current Weight  02/09/13 94.62 kg (208 lb 9.6 oz)      Intake/Output from previous day: 11/23 0701 - 11/24 0700 In: 240 [P.O.:240] Out: 1100 [Urine:1100]   Physical Exam:  Cardiovascular: RRR, no murmurs, gallops, or rubs. Pulmonary: Diminished at bases bilaterally; no rales, wheezes, or rhonchi. Abdomen: Soft, non tender, bowel sounds present. Extremities: Trace bilateral lower extremity edema. Wounds: Clean and dry.  No erythema or signs of infection.  Lab Results: CBC: Recent Labs  02/07/13 0420 02/08/13 0523  WBC 7.7 8.6  HGB 9.4* 9.0*  HCT 27.9* 26.7*  PLT 60* 91*   BMET:  Recent Labs  02/07/13 0420 02/08/13 0523  NA 133* 132*  K 4.8 4.8  CL 100 99  CO2 26 25  GLUCOSE 224* 250*  BUN 17 24*  CREATININE 0.98 0.96  CALCIUM 10.2 10.7*    PT/INR:  Lab Results  Component Value Date   INR 1.20 02/05/2013   INR 0.99 02/02/2013   INR 1.20 09/22/2009   ABG:  INR: Will add last result for INR, ABG once components are confirmed Will add last 4 CBG results once components are confirmed  Assessment/Plan:  1. CV - SR. On Lopressor 25 bid, Ramipril 2.5 daily, and Imdur 15 daily. 2.  Pulmonary -  On room air. Encourage incentive spirometer 3. DM- CBGs 238/225/193. On Metformin 1000 bid and Insulin. Will adjust insulin for better glucose control.Pre op HGA1C 10.2. Will need follow up with medical doctor at discharge. 4.  Acute blood loss anemia - Last H and H 9 and 26.7 5.Thrombocytopenia-last platelets up to 91,000 6. LOC constipation 7. Likely discharge in am  ZIMMERMAN,DONIELLE MPA-C 02/09/2013,8:01 AM  patient examined and medical record reviewed,agree with above note. VAN TRIGT III,Demonie Kassa 02/09/2013

## 2013-02-09 NOTE — Progress Notes (Signed)
Subjective: Ambulating well.  Objective: Vital signs in last 24 hours: Temp:  [98.8 F (37.1 C)-98.9 F (37.2 C)] 98.9 F (37.2 C) (11/24 0433) Pulse Rate:  [78-91] 78 (11/24 0433) Resp:  [19-21] 19 (11/24 0433) BP: (101-113)/(58-70) 102/62 mmHg (11/24 0433) SpO2:  [91 %-97 %] 97 % (11/24 0831) Weight:  [208 lb 9.6 oz (94.62 kg)] 208 lb 9.6 oz (94.62 kg) (11/24 0433) Last BM Date: 02/05/13  Intake/Output from previous day: 11/23 0701 - 11/24 0700 In: 240 [P.O.:240] Out: 1100 [Urine:1100] Intake/Output this shift: Total I/O In: 240 [P.O.:240] Out: -   Medications Current Facility-Administered Medications  Medication Dose Route Frequency Provider Last Rate Last Dose  . 0.9 %  sodium chloride infusion  250 mL Intravenous PRN Delight Ovens, MD      . aspirin EC tablet 325 mg  325 mg Oral Daily Kerin Perna, MD   325 mg at 02/08/13 1016  . bisacodyl (DULCOLAX) EC tablet 10 mg  10 mg Oral Daily PRN Delight Ovens, MD   10 mg at 02/08/13 1015   Or  . bisacodyl (DULCOLAX) suppository 10 mg  10 mg Rectal Daily PRN Delight Ovens, MD      . budesonide-formoterol [facility]) 160-4.5 MCG/ACT inhaler 2 puff  2 puff Inhalation BID Kerin Perna, MD   2 puff at 02/09/13 0831  . docusate sodium (COLACE) capsule 200 mg  200 mg Oral Daily Delight Ovens, MD   200 mg at 02/08/13 1016  . fenofibrate tablet 160 mg  160 mg Oral q1800 Wayne E Gold, PA-C   160 mg at 02/08/13 1757  . insulin aspart (novoLOG) injection 0-24 Units  0-24 Units Subcutaneous TID AC & HS Delight Ovens, MD   4 Units at 02/09/13 [phone]  . insulin aspart (novoLOG) injection 10 Units  10 Units Subcutaneous TID WC Donielle Margaretann Loveless, PA-C      . insulin detemir (LEVEMIR) injection 38 Units  38 Units Subcutaneous BID Donielle M Zimmerman, PA-C      . isosorbide mononitrate (IMDUR) 24 hr tablet 15 mg  15 mg Oral Daily Kerin Perna, MD   15 mg at 02/08/13 1018  . lactulose (CHRONULAC) 10 GM/15ML  solution 20 g  20 g Oral Daily PRN Erin Barrett, PA-C   20 g at 02/08/13 1611  . lactulose (CHRONULAC) 10 GM/15ML solution 20 g  20 g Oral Once Donielle M Zimmerman, PA-C      . levalbuterol [facility]) nebulizer solution 1.25 mg  1.25 mg Nebulization Q6H PRN Kerin Perna, MD      . metFORMIN (GLUCOPHAGE) tablet 1,000 mg  1,000 mg Oral BID WC Delight Ovens, MD   1,000 mg at 02/09/13 0736  . metoprolol tartrate (LOPRESSOR) tablet 25 mg  25 mg Oral BID Erin Barrett, PA-C   25 mg at 02/08/13 2210  . mupirocin ointment (BACTROBAN) 2 % 1 application  1 application Nasal BID Runell Gess, MD   1 application at 02/08/13 2209  . ondansetron (ZOFRAN) tablet 4 mg  4 mg Oral Q6H PRN Delight Ovens, MD       Or  . ondansetron [facility]) injection 4 mg  4 mg Intravenous Q6H PRN Delight Ovens, MD      . oxyCODONE (Oxy IR/ROXICODONE) immediate release tablet 5-10 mg  5-10 mg Oral Q3H PRN Delight Ovens, MD   10 mg at 02/09/13 0140  . pantoprazole (PROTONIX) EC tablet 40 mg  40 mg Oral QAC breakfast Delight Ovens, MD   40 mg at 02/09/13 0736  . ramipril (ALTACE) capsule 2.5 mg  2.5 mg Oral Daily Delight Ovens, MD   2.5 mg at 02/08/13 1019  . simvastatin (ZOCOR) tablet 10 mg  10 mg Oral q1800 Erin Barrett, PA-C   10 mg at 02/08/13 1757  . sodium chloride 0.9 % injection 3 mL  3 mL Intravenous Q12H Delight Ovens, MD   3 mL at 02/08/13 1022  . sodium chloride 0.9 % injection 3 mL  3 mL Intravenous PRN Delight Ovens, MD      . traMADol Janean Sark) tablet 50-100 mg  50-100 mg Oral Q4H PRN Delight Ovens, MD        PE: General appearance: alert, cooperative and no distress Lungs: clear to auscultation bilaterally Heart: regular rate and rhythm, S1, S2 normal, no murmur, click, rub or gallop Extremities: 2+ R LEE  Pulses: 2+ and symmetric Skin: Warm and dry.  Steral wound and left UE w/o signs of infection. Neurologic: Grossly normal  Lab Results:   Recent Labs  02/06/13 1600  02/06/13 1638 02/07/13 0420 02/08/13 0523  WBC 7.9  --  7.7 8.6  HGB 9.8* 9.9* 9.4* 9.0*  HCT 29.1* 29.0* 27.9* 26.7*  PLT 74*  --  60* 91*   BMET  Recent Labs  02/06/13 1638 02/07/13 0420 02/08/13 0523  NA 138 133* 132*  K 4.6 4.8 4.8  CL 105 100 99  CO2  --  26 25  GLUCOSE 144* 224* 250*  BUN 17 17 24*  CREATININE 1.10 0.98 0.96  CALCIUM  --  10.2 10.7*    Assessment/Plan  Principal Problem:   CAD (coronary artery disease), LAD 90%, 1st diag 95%, LCX 70% wth AV groove 90%, RCA 40-50% mid and 80% long distal stenosis 02/03/13 Active Problems:   Hyperlipidemia   DM2 (diabetes mellitus, type 2)   Abnormal cardiovascular stress test   S/P CABG x 4, 02/05/13 LIMA-LAD; LT. RADIAL-OM;VG-DIAG; VG-PDA  Plan:  POD # 4, CABGx4:  LIMA-LAD; LT. RADIAL-OM;VG-DIAG; VG-PDA.  Maintaining NSR.  BP stable.  Meds: ASA, fenofibrate, Imdur 15, lopressor 25, ramipril 2.5, zocor. Likely DC in AM.   LOS: 6 days    HAGER, BRYAN 02/09/2013 8:38 AM  I have seen and examined the patient along with Wilburt Finlay, PA.  I have reviewed the chart, notes and new data.  I agree with PA's note.  PLAN: Looks great. No signs of HF or arrhythmia. Only remaining problem is he has not had a BM, but he is passing gas. Anticipate DC tomorrow and we'll arrange follow up.  Thurmon Fair, MD, [facility] [phone] 02/09/2013, 1:30 PM

## 2013-02-10 ENCOUNTER — Inpatient Hospital Stay (HOSPITAL_COMMUNITY): Payer: Managed Care, Other (non HMO)

## 2013-02-10 LAB — GLUCOSE, CAPILLARY: Glucose-Capillary: 277 mg/dL — ABNORMAL HIGH (ref 70–99)

## 2013-02-10 MED ORDER — OXYCODONE HCL 5 MG PO TABS: 5.0000 mg | ORAL_TABLET | ORAL | Status: DC | PRN

## 2013-02-10 MED ORDER — ASPIRIN 325 MG PO TBEC: 325.0000 mg | DELAYED_RELEASE_TABLET | Freq: Every day | ORAL | Status: DC

## 2013-02-10 MED ORDER — METOPROLOL TARTRATE 25 MG PO TABS: 25.0000 mg | ORAL_TABLET | Freq: Two times a day (BID) | ORAL | Status: DC

## 2013-02-10 MED ORDER — SIMVASTATIN 10 MG PO TABS: 10.0000 mg | ORAL_TABLET | Freq: Every day | ORAL | Status: DC

## 2013-02-10 MED ORDER — ISOSORBIDE MONONITRATE 15 MG HALF TABLET: 15.0000 mg | ORAL_TABLET | Freq: Every day | ORAL | Status: DC

## 2013-02-10 NOTE — Progress Notes (Signed)
[id number] Cardiac Rehab Completed discharge education with pt and wife. Pt voices understanding.  Juan [name] 10:31 AM 02/10/2013

## 2013-02-10 NOTE — Progress Notes (Signed)
Pt discharged per Md order and protocol. Discharge instructions reviewed with patient and wife, all questions answered. Pt given all prescriptions and aware of follow up appointments. Pt given home insulin that was temporarily stored in pharmacy.

## 2013-02-10 NOTE — Progress Notes (Signed)
Subjective: Ambulating well. No C/o CP, SOB or palpitations.  No edema  Objective: Vital signs in last 24 hours: Temp:  [97.6 F (36.4 C)-98.8 F (37.1 C)] 97.6 F (36.4 C) (11/25 0555) Pulse Rate:  [85-91] 85 (11/25 0555) Resp:  [18-19] 18 (11/25 0555) BP: (108-127)/(61-66) 108/65 mmHg (11/25 0555) SpO2:  [94 %-95 %] 95 % (11/25 0555) Weight:  [205 lb 0.4 oz (93 kg)] 205 lb 0.4 oz (93 kg) (11/25 0555) Last BM Date: 02/05/13  Intake/Output from previous day: 11/24 0701 - 11/25 0700 In: 1340 [P.O.:1340] Out: 1550 [Urine:1550] Intake/Output this shift: Total I/O In: 240 [P.O.:240] Out: 500 [Urine:500]  Medications Current Facility-Administered Medications  Medication Dose Route Frequency Provider Last Rate Last Dose  . 0.9 %  sodium chloride infusion  250 mL Intravenous PRN Delight Ovens, MD      . aspirin EC tablet 325 mg  325 mg Oral Daily Kerin Perna, MD   325 mg at 02/10/13 1053  . bisacodyl (DULCOLAX) EC tablet 10 mg  10 mg Oral Daily PRN Delight Ovens, MD   10 mg at 02/10/13 0818   Or  . bisacodyl (DULCOLAX) suppository 10 mg  10 mg Rectal Daily PRN Delight Ovens, MD      . budesonide-formoterol [facility]) 160-4.5 MCG/ACT inhaler 2 puff  2 puff Inhalation BID Kerin Perna, MD   2 puff at 02/09/13 2032  . docusate sodium (COLACE) capsule 200 mg  200 mg Oral Daily Delight Ovens, MD   200 mg at 02/10/13 0817  . fenofibrate tablet 160 mg  160 mg Oral q1800 Wayne E Gold, PA-C   160 mg at 02/09/13 1706  . insulin aspart (novoLOG) injection 0-24 Units  0-24 Units Subcutaneous TID AC & HS Delight Ovens, MD   12 Units at 02/10/13 [phone]  . insulin aspart (novoLOG) injection 10 Units  10 Units Subcutaneous TID WC Ardelle Balls, PA-C   10 Units at 02/10/13 0809  . insulin detemir (LEVEMIR) injection 38 Units  38 Units Subcutaneous BID Ardelle Balls, PA-C   38 Units at 02/09/13 2149  . isosorbide mononitrate (IMDUR) 24 hr tablet 15 mg  15  mg Oral Daily Kerin Perna, MD   15 mg at 02/10/13 1053  . lactulose (CHRONULAC) 10 GM/15ML solution 20 g  20 g Oral Daily PRN Erin Barrett, PA-C   20 g at 02/09/13 0932  . levalbuterol (XOPENEX) nebulizer solution 1.25 mg  1.25 mg Nebulization Q6H PRN Kerin Perna, MD      . metFORMIN (GLUCOPHAGE) tablet 1,000 mg  1,000 mg Oral BID WC Delight Ovens, MD   1,000 mg at 02/10/13 0636  . metoprolol tartrate (LOPRESSOR) tablet 25 mg  25 mg Oral BID Erin Barrett, PA-C   25 mg at 02/10/13 1053  . ondansetron (ZOFRAN) tablet 4 mg  4 mg Oral Q6H PRN Delight Ovens, MD       Or  . ondansetron [facility]) injection 4 mg  4 mg Intravenous Q6H PRN Delight Ovens, MD      . oxyCODONE (Oxy IR/ROXICODONE) immediate release tablet 5-10 mg  5-10 mg Oral Q3H PRN Delight Ovens, MD   10 mg at 02/09/13 2115  . pantoprazole (PROTONIX) EC tablet 40 mg  40 mg Oral QAC breakfast Delight Ovens, MD   40 mg at 02/10/13 0636  . ramipril (ALTACE) capsule 2.5 mg  2.5 mg Oral Daily Delight Ovens,  MD   2.5 mg at 02/10/13 1053  . simvastatin (ZOCOR) tablet 10 mg  10 mg Oral q1800 Erin Barrett, PA-C   10 mg at 02/09/13 1706  . sodium chloride 0.9 % injection 3 mL  3 mL Intravenous Q12H Delight Ovens, MD   3 mL at 02/09/13 2115  . sodium chloride 0.9 % injection 3 mL  3 mL Intravenous PRN Delight Ovens, MD      . traMADol Janean Sark) tablet 50-100 mg  50-100 mg Oral Q4H PRN Delight Ovens, MD   100 mg at 02/10/13 0818    PE: General appearance: alert, cooperative and no distress Lungs: CTAB, non-labored Heart: RRR, S1, S2 normal, no murmur, click, rub or gallop Extremities: 2+ R LEE  Pulses: 2+ and symmetric Skin: Warm and dry.  Sternal wound and left arm w/o signs of infection. Neurologic: Grossly normal  Lab Results:   Recent Labs  02/08/13 0523  WBC 8.6  HGB 9.0*  HCT 26.7*  PLT 91*   BMET  Recent Labs  02/08/13 0523  NA 132*  K 4.8  CL 99  CO2 25  GLUCOSE 250*  BUN 24*    CREATININE 0.96  CALCIUM 10.7*    Assessment/Plan  Principal Problem:   CAD (coronary artery disease), LAD 90%, 1st diag 95%, LCX 70% wth AV groove 90%, RCA 40-50% mid and 80% long distal stenosis 02/03/13 Active Problems:   Hyperlipidemia   DM2 (diabetes mellitus, type 2)   Abnormal cardiovascular stress test   S/P CABG x 4, 02/05/13 LIMA-LAD; LT. RADIAL-OM;VG-DIAG; VG-PDA  Plan:  POD #5, CABGx4:  LIMA-LAD; LT. RADIAL-OM;VG-DIAG; VG-PDA.   Looks great. No signs of HF or arrhythmia Maintaining NSR.  BP stable.   Meds: ASA, fenofibrate/zocor Imdur 15, lopressor 25, ramipril 2.5, .. (may need to change to different statin with fibrate - defer to Dr. Allyson Sabal on OP ROV)  Likely DC today - we will arrange f/u   LOS: 7 days    HARDING,Marsden W 02/10/2013 11:14 AM

## 2013-02-10 NOTE — Progress Notes (Signed)
Pt still has not had BM since surgery. Pt passing gas and no complaints of pain. Laxatives given as ordered this am. Pt refusing anymore laxatives at this time.  PA made aware. Orders given to still discharge pt.

## 2013-02-10 NOTE — Progress Notes (Signed)
      [address]       [place] [address]             [phone]      5 Days Post-Op  Procedure(s) (LRB): CORONARY ARTERY BYPASS GRAFTING (CABG) (N/A) INTRAOPERATIVE TRANSESOPHAGEAL ECHOCARDIOGRAM (N/A) RADIAL ARTERY HARVEST (Left) Subjective: Feels well  Objective  Telemetry sinus rhythm  Temp:  [97.6 F (36.4 C)-98.8 F (37.1 C)] 97.6 F (36.4 C) (11/25 0555) Pulse Rate:  [85-91] 85 (11/25 0555) Resp:  [18-19] 18 (11/25 0555) BP: (108-127)/(61-66) 108/65 mmHg (11/25 0555) SpO2:  [94 %-97 %] 95 % (11/25 0555) Weight:  [205 lb 0.4 oz (93 kg)] 205 lb 0.4 oz (93 kg) (11/25 0555)   Intake/Output Summary (Last 24 hours) at 02/10/13 0743 Last data filed at 02/10/13 0600  Gross per 24 hour  Intake   1340 ml  Output   1550 ml  Net   -210 ml       General appearance: alert, cooperative and no distress Heart: regular rate and rhythm Lungs: dim in bases Abdomen: benign Extremities: + LE edema Wound: incisions healing well  Lab Results:  Recent Labs  02/08/13 0523  NA 132*  K 4.8  CL 99  CO2 25  GLUCOSE 250*  BUN 24*  CREATININE 0.96  CALCIUM 10.7*   No results found for this basename: AST, ALT, ALKPHOS, BILITOT, PROT, ALBUMIN,  in the last 72 hours No results found for this basename: LIPASE, AMYLASE,  in the last 72 hours  Recent Labs  02/08/13 0523  WBC 8.6  HGB 9.0*  HCT 26.7*  MCV 78.5  PLT 91*   No results found for this basename: CKTOTAL, CKMB, TROPONINI,  in the last 72 hours No components found with this basename: POCBNP,  No results found for this basename: DDIMER,  in the last 72 hours No results found for this basename: HGBA1C,  in the last 72 hours No results found for this basename: CHOL, HDL, LDLCALC, TRIG, CHOLHDL,  in the last 72 hours No results found for this basename: TSH, T4TOTAL, FREET3, T3FREE, THYROIDAB,  in the last 72 hours No results found for this basename: VITAMINB12, FOLATE, FERRITIN, TIBC, IRON,  RETICCTPCT,  in the last 72 hours  Medications: Scheduled . aspirin EC  325 mg Oral Daily  . budesonide-formoterol  2 puff Inhalation BID  . docusate sodium  200 mg Oral Daily  . fenofibrate  160 mg Oral q1800  . insulin aspart  0-24 Units Subcutaneous TID AC & HS  . insulin aspart  10 Units Subcutaneous TID WC  . insulin detemir  38 Units Subcutaneous BID  . isosorbide mononitrate  15 mg Oral Daily  . metFORMIN  1,000 mg Oral BID WC  . metoprolol tartrate  25 mg Oral BID  . pantoprazole  40 mg Oral QAC breakfast  . ramipril  2.5 mg Oral Daily  . simvastatin  10 mg Oral q1800  . sodium chloride  3 mL Intravenous Q12H     Radiology/Studies:  No results found.  INR: Will add last result for INR, ABG once components are confirmed Will add last 4 CBG results once components are confirmed  Assessment/Plan: S/P Procedure(s) (LRB): CORONARY ARTERY BYPASS GRAFTING (CABG) (N/A) INTRAOPERATIVE TRANSESOPHAGEAL ECHOCARDIOGRAM (N/A) RADIAL ARTERY HARVEST (Left) Plan for discharge: see discharge orders   LOS: 7 days    Juan [name] E 11/25/20147:43 AM

## 2013-02-12 NOTE — Discharge Summary (Signed)
patient examined and medical record reviewed,agree with above note. VAN TRIGT III,Messiah Ahr 02/12/2013   

## 2013-02-16 ENCOUNTER — Other Ambulatory Visit: Payer: Self-pay | Admitting: *Deleted

## 2013-02-16 DIAGNOSIS — I2510 Atherosclerotic heart disease of native coronary artery without angina pectoris: Secondary | ICD-10-CM

## 2013-02-16 DIAGNOSIS — G8918 Other acute postprocedural pain: Secondary | ICD-10-CM

## 2013-02-16 MED ORDER — OXYCODONE HCL 5 MG PO TABS: 5.0000 mg | ORAL_TABLET | ORAL | Status: DC | PRN

## 2013-02-17 ENCOUNTER — Ambulatory Visit (INDEPENDENT_AMBULATORY_CARE_PROVIDER_SITE_OTHER): Payer: Managed Care, Other (non HMO) | Admitting: *Deleted

## 2013-02-17 DIAGNOSIS — Z4802 Encounter for removal of sutures: Secondary | ICD-10-CM

## 2013-02-17 DIAGNOSIS — Z951 Presence of aortocoronary bypass graft: Secondary | ICD-10-CM

## 2013-02-17 DIAGNOSIS — R600 Localized edema: Secondary | ICD-10-CM

## 2013-02-17 DIAGNOSIS — I2510 Atherosclerotic heart disease of native coronary artery without angina pectoris: Secondary | ICD-10-CM

## 2013-02-17 MED ORDER — FUROSEMIDE 40 MG PO TABS: 40.0000 mg | ORAL_TABLET | Freq: Every day | ORAL | Status: DC

## 2013-02-17 MED ORDER — POTASSIUM CHLORIDE CRYS ER 10 MEQ PO TBCR: 20.0000 meq | EXTENDED_RELEASE_TABLET | Freq: Every day | ORAL | Status: DC

## 2013-02-18 NOTE — Progress Notes (Signed)
Juan [name] returns for staple removal from his left radial harvest long incision.  This incision, as well as, his sternal incision, chest tube sites and left leg evh site are all healing very well.  His most concern is regarding his pain med and not wanting to become too dependant upon them.  I reassured him that he was in the early stage of recuperation and that should not be a problem at this point.  He also does have 1-2+ edema of both lower extremities.  Proper elevation encouraged.  Every other staple was removed followed by the remaining ones and steri strips were applied.  A 7 day supply of Lasix and Potassium was ordered for his edema.  He will return as scheduled.

## 2013-02-23 ENCOUNTER — Other Ambulatory Visit: Payer: Managed Care, Other (non HMO)

## 2013-02-25 ENCOUNTER — Encounter: Payer: Self-pay | Admitting: Cardiovascular Disease

## 2013-02-25 ENCOUNTER — Ambulatory Visit (INDEPENDENT_AMBULATORY_CARE_PROVIDER_SITE_OTHER): Payer: Managed Care, Other (non HMO) | Admitting: Cardiovascular Disease

## 2013-02-25 VITALS — BP 120/70 | HR 91 | Ht 71.0 in | Wt 199.0 lb

## 2013-02-25 DIAGNOSIS — I2510 Atherosclerotic heart disease of native coronary artery without angina pectoris: Secondary | ICD-10-CM

## 2013-02-25 DIAGNOSIS — E119 Type 2 diabetes mellitus without complications: Secondary | ICD-10-CM

## 2013-02-25 DIAGNOSIS — Z951 Presence of aortocoronary bypass graft: Secondary | ICD-10-CM

## 2013-02-25 DIAGNOSIS — E785 Hyperlipidemia, unspecified: Secondary | ICD-10-CM

## 2013-02-25 NOTE — Assessment & Plan Note (Signed)
On statin therapy and fenofibrate . Followed by Dr. Hulda Marin at [facility]

## 2013-02-25 NOTE — Patient Instructions (Signed)
Your physician wants you to follow-up in: 3 months with Dr Berry. You will receive a reminder letter in the mail two months in advance. If you don't receive a letter, please call our office to schedule the follow-up appointment.  

## 2013-02-25 NOTE — Progress Notes (Signed)
02/25/2013 Juan [name]   1965-09-14  [id number]  Primary Physician Eber Hong, MD Primary Cardiologist: Runell Gess MD [name]   HPI:  The patient returns today for followup. He is a 47 year old mild to moderately overweight, married Caucasian male, father of 1 child who I last saw 3 years ago. He has seen Dr. Royann Shivers twice in 2011. He has a long history of insulin-dependent diabetes as well as extremely high hypertriglyceridemia in the 3000-6000 range with multiple episodes of pancreatitis. He had negative venous Dopplers for DVTs. He does wear compression stockings. He has complained of left inframammary chest pain and increasing shortness of breath. He is not aware of his family history since he was adopted. He had a CT scan of his chest and abdomen which showed a mass at the tail of his pancreas but also showed severe calcification in the LAD. He did have a Myoview stress test performed 2 years ago that was nonischemic. At that time, he had no symptoms of chest pain or shortness of breath. I referred him to Dr. Hulda Marin at [facility] for more intense treatment of his severe hypertriglyceridemia.since I saw him last in December of last several weeks he developed exertional chest pain and shortness of breath.I was concerned that he has developed progression of disease given all of his risk factors.  He had a Myoview stress test performed that showed inferior ischemia which is high risk. He continued to have exertional chest pain with left upper irradiation.I performed cardiac catheterization on him 02/03/13 revealing three-vessel disease and preserved LV function. He subsequently underwent coronary artery bypass bypass grafting x4 by Dr. Kathlee Nations Trigt with a LIMA to his LAD, a left radial to obtuse marginal branch, vein graft to an diagonal branch and PDA. His postop course was uncomplicated.   Current Outpatient Prescriptions  Medication  Sig Dispense Refill  . aspirin EC 325 MG EC tablet Take 1 tablet (325 mg total) by mouth daily.      . fenofibrate 160 MG tablet Take 160 mg by mouth daily. PM      . insulin NPH (HUMULIN N,NOVOLIN N) 100 UNIT/ML injection Inject 15 Units into the skin at bedtime.       . insulin regular human CONCENTRATED (HUMULIN R) 500 UNIT/ML SOLN injection Inject 20-40 Units into the skin 2 (two) times daily with a meal. 20 units every morning and 40 units at bedtime      . Insulin Syringe-Needle U-100 (INSULIN SYRINGE 1CC/31GX5/16") 31G X 5/16" 1 ML MISC       . isosorbide mononitrate (IMDUR) 15 mg TB24 24 hr tablet Take 0.5 tablets (15 mg total) by mouth daily.  30 each  0  . metFORMIN (GLUCOPHAGE) 1000 MG tablet Take 1,000 mg by mouth 2 (two) times daily with a meal.      . metoprolol tartrate (LOPRESSOR) 25 MG tablet Take 1 tablet (25 mg total) by mouth 2 (two) times daily.  60 tablet  1  . ONE TOUCH ULTRA TEST test strip       . oxyCODONE (OXY IR/ROXICODONE) 5 MG immediate release tablet Take 1-2 tablets (5-10 mg total) by mouth every 4 (four) hours as needed for severe pain.  50 tablet  0  . ramipril (ALTACE) 2.5 MG capsule Take 2.5 mg by mouth daily.      . simvastatin (ZOCOR) 10 MG tablet Take 1 tablet (10 mg total) by mouth daily at 6 PM.  30 tablet  1   No current facility-administered medications for this visit.    No Known Allergies  History   Social History  . Marital Status: Married    Spouse Name: [name]    Number of Children: 1  . 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 Smoking status: Never Smoker   . Smokeless tobacco: Never Used  . Alcohol Use: No  . Drug Use: No  . Sexual Activity: Yes   Other Topics Concern  . Not on file   Social History Narrative   Adopted, so no pertinent FH.  Married.  Lives with wife in [place].  Independent of ADLs and ambulation.     Review of Systems: General:  negative for chills, fever, night sweats or weight changes.  Cardiovascular: negative for chest pain, dyspnea on exertion, edema, orthopnea, palpitations, paroxysmal nocturnal dyspnea or shortness of breath Dermatological: negative for rash Respiratory: negative for cough or wheezing Urologic: negative for hematuria Abdominal: negative for nausea, vomiting, diarrhea, bright red blood per rectum, melena, or hematemesis Neurologic: negative for visual changes, syncope, or dizziness All other systems reviewed and are otherwise negative except as noted above.    Blood pressure 120/70, pulse 91, height 5\' 11"  (1.803 m), weight 199 lb (90.266 kg).  General appearance: alert and no distress Neck: no adenopathy, no carotid bruit, no JVD, supple, symmetrical, trachea midline and thyroid not enlarged, symmetric, no tenderness/mass/nodules Lungs: clear to auscultation bilaterally Heart: regular rate and rhythm, S1, S2 normal, no murmur, click, rub or gallop Extremities: his staff as vein bypass graft harvest sites are well-healed  EKG normal sinus rhythm at 91 without ST or T wave changes  ASSESSMENT AND PLAN:   S/P CABG x 4, 02/05/13 LIMA-LAD; LT. RADIAL-OM;VG-DIAG; VG-PDA I performed cardiac catheterization 02/03/13 revealing three-vessel disease with preserved LV function. Several days later he underwent coronary artery bypass grafting x4 by Dr. Kathlee Nations Trigt with a LIMA to his LAD, a left radial to obtuse marginal branch, vein graft to a diagonal branch and PDA. His postop course was uncomplicated. He's been recuperating at home. I suggested that he put his pancreatic rehabilitation.  Hyperlipidemia On statin therapy and fenofibrate . Followed by Dr. Hulda Marin at [facility]      Runell Gess MD [facility], [facility] 02/25/2013 4:32 PM

## 2013-02-25 NOTE — Assessment & Plan Note (Signed)
I performed cardiac catheterization 02/03/13 revealing three-vessel disease with preserved LV function. Several days later he underwent coronary artery bypass grafting x4 by Dr. Kathlee Nations Trigt with a LIMA to his LAD, a left radial to obtuse marginal branch, vein graft to a diagonal branch and PDA. His postop course was uncomplicated. He's been recuperating at home. I suggested that he put his pancreatic rehabilitation.

## 2013-02-26 ENCOUNTER — Encounter: Payer: Self-pay | Admitting: Cardiovascular Disease

## 2013-03-04 ENCOUNTER — Ambulatory Visit (INDEPENDENT_AMBULATORY_CARE_PROVIDER_SITE_OTHER): Payer: Managed Care, Other (non HMO) | Admitting: Cardiothoracic Surgery

## 2013-03-04 ENCOUNTER — Encounter: Payer: Self-pay | Admitting: Cardiothoracic Surgery

## 2013-03-04 ENCOUNTER — Ambulatory Visit
Admission: RE | Admit: 2013-03-04 | Discharge: 2013-03-04 | Disposition: A | Discharge status: Home / Self Care | Payer: Managed Care, Other (non HMO) | Source: Ambulatory Visit | Attending: Cardiothoracic Surgery | Admitting: Cardiothoracic Surgery

## 2013-03-04 VITALS — BP 128/78 | HR 90 | Resp 20 | Ht 71.0 in | Wt 199.0 lb

## 2013-03-04 DIAGNOSIS — Z951 Presence of aortocoronary bypass graft: Secondary | ICD-10-CM

## 2013-03-04 DIAGNOSIS — I2510 Atherosclerotic heart disease of native coronary artery without angina pectoris: Secondary | ICD-10-CM

## 2013-03-04 DIAGNOSIS — Z0279 Encounter for issue of other medical certificate: Secondary | ICD-10-CM

## 2013-03-04 NOTE — Progress Notes (Signed)
PCP is Eber Hong, MD Referring Provider is Runell Gess, MD  Chief Complaint  Patient presents with  . Routine Post Op    F/U from surgery with CXR, S/P CABG x 4 on 02/05/2013    HPI: One month followup after urgent CABG x4 performed secondary to unstable angina with severe three-vessel CAD. Patient has history of diabetes and hyperlipidemia. Surgical incisions have healed nicely including sternotomy, left arm incision, and right leg and the vein harvest site.  Postoperative left pleural effusion has resolved. Postoperative pedal edema has resolved. Patient has chronic left ankle edema from venous disease.  Patient notes some sternal popping with movement of the arms or shoulders. He was advised to not lift more than 10 pounds avoid stressing the sternal incision. On exam the sternum is stable.  Patient was advised to stop taking his imdur for the radial artery graft to the OM  Past Medical History  Diagnosis Date  . Pancreatitis     occasional - last episode 06/2011  . Hyperlipidemia   . Fatty liver disease, nonalcoholic 06/16/2011  . Anemia     occasional - no problems currently(10/02/2011)  . Diabetes mellitus     IDDM  . Bilateral renal cysts 06/16/2011    states no known problems  . Non Hodgkin's lymphoma 1991  . Hypertension     states no dx. of HTN, takes med. to protect kidneys due to DM  . Splenomegaly, congestive, chronic   . Stuffy and runny nose 10/02/2011    yellow drainage from nose  . Loose body in knee 09/2011    loose bodies left knee  . Lateral meniscus tear 09/2011    left  . Blood transfusion without reported diagnosis   . Coronary artery calcification seen on CAT scan   . Chest pain     positive Myoview stress test  . Abnormal cardiovascular stress test 02/06/2013  . CAD (coronary artery disease), LAD 90%, 1st diag 95%, LCX 70% wth AV groove 90%, RCA 40-50% mid and 80% long distal stenosis 02/03/13 02/04/2013  . S/P CABG x 4, 02/05/13 LIMA-LAD;  LT. RADIAL-OM;VG-DIAG; VG-PDA 02/06/2013    Past Surgical History  Procedure Laterality Date  . Nasal septum surgery    . Tonsillectomy    . Elbow surgery    . Hernia repair      inguinal   . Tibia bone biopsy  x 3    left  . Knee arthroscopy  10/09/2011    Procedure: ARTHROSCOPY KNEE;  Surgeon: [name], MD;  Location: [facility];  Service: Orthopedics;  Laterality: Left;  . Coronary artery bypass graft N/A 02/05/2013    Procedure: CORONARY ARTERY BYPASS GRAFTING (CABG);  Surgeon: Kerin Perna, MD;  Location: [facility] OR;  Service: Open Heart Surgery;  Laterality: N/A;  Coronary artery bypass graft on pump times four using left internal mammary artery and right greater saphenous vein via endovein harvest and left radial artery harvest.   . Intraoperative transesophageal echocardiogram N/A 02/05/2013    Procedure: INTRAOPERATIVE TRANSESOPHAGEAL ECHOCARDIOGRAM;  Surgeon: Kerin Perna, MD;  Location: [facility] OR;  Service: Open Heart Surgery;  Laterality: N/A;  . Radial artery harvest Left 02/05/2013    Procedure: RADIAL ARTERY HARVEST;  Surgeon: Kerin Perna, MD;  Location: [facility] OR;  Service: Vascular;  Laterality: Left;    Family History  Problem Relation Age of Onset  . Adopted: Yes    Social History History  Substance Use Topics  . Smoking  status: Never Smoker   . Smokeless tobacco: Never Used  . Alcohol Use: No    Current Outpatient Prescriptions  Medication Sig Dispense Refill  . aspirin EC 325 MG EC tablet Take 1 tablet (325 mg total) by mouth daily.      . fenofibrate 160 MG tablet Take 160 mg by mouth daily. PM      . insulin NPH (HUMULIN N,NOVOLIN N) 100 UNIT/ML injection Inject 15 Units into the skin at bedtime.       . insulin regular human CONCENTRATED (HUMULIN R) 500 UNIT/ML SOLN injection Inject 20-40 Units into the skin 2 (two) times daily with a meal. 20 units every morning and 40 units at bedtime      . Insulin Syringe-Needle U-100  (INSULIN SYRINGE 1CC/31GX5/16") 31G X 5/16" 1 ML MISC       . isosorbide mononitrate (IMDUR) 15 mg TB24 24 hr tablet Take 0.5 tablets (15 mg total) by mouth daily.  30 each  0  . metFORMIN (GLUCOPHAGE) 1000 MG tablet Take 1,000 mg by mouth 2 (two) times daily with a meal.      . metoprolol tartrate (LOPRESSOR) 25 MG tablet Take 1 tablet (25 mg total) by mouth 2 (two) times daily.  60 tablet  1  . ONE TOUCH ULTRA TEST test strip       . ramipril (ALTACE) 2.5 MG capsule Take 2.5 mg by mouth daily.      . simvastatin (ZOCOR) 10 MG tablet Take 1 tablet (10 mg total) by mouth daily at 6 PM.  30 tablet  1  . acetaminophen (TYLENOL) 325 MG tablet Take 650 mg by mouth every 6 (six) hours as needed.       No current facility-administered medications for this visit.    No Known Allergies  Review of Systems groove appetite and overall strength No recurrent angina, having some musculoskeletal pain over the chest relieved by Tylenol  BP 128/78  Pulse 90  Resp 20  Ht 5\' 11"  (1.803 m)  Wt 199 lb (90.266 kg)  BMI 27.77 kg/m2  SpO2 96% Physical Exam Alert and comfortable Lungs clear Sternum stable well-healed Heart rhythm regular without murmur or gallop Left arm incision well-healed with good grip Right leg and the vein harvest site well healed no edema  Diagnostic Tests: Chest x-ray shows clearing of left pleural effusion  Impression: Excellent progress one month after surgery. Patient will be a will start our hospital based cardiac  rehabilitation. The patient can drive. The patient should not lift more than 10 pounds because of  sternal clicking  Plan: Return in one month. Patient did not return to work yet. We'll review return to work issues on his return visit

## 2013-03-08 ENCOUNTER — Other Ambulatory Visit: Payer: Self-pay | Admitting: Surgical

## 2013-03-13 ENCOUNTER — Telehealth: Payer: Self-pay | Admitting: Cardiology

## 2013-03-13 NOTE — Telephone Encounter (Signed)
Pt called asking for refill on ASA.  I asked to buy OTC.  But he should continue 325 mg daily.  Pt understands.

## 2013-03-17 ENCOUNTER — Encounter (HOSPITAL_COMMUNITY)
Admission: RE | Admit: 2013-03-17 | Discharge: 2013-03-17 | Disposition: A | Discharge status: Home / Self Care | Payer: Managed Care, Other (non HMO) | Source: Ambulatory Visit | Attending: Cardiovascular Disease | Admitting: Cardiovascular Disease

## 2013-03-17 NOTE — Progress Notes (Signed)
.  Cardiac Rehab Medication Review by a Pharmacist  Does the patient  feel that his/her medications are working for him/her?  yes  Has the patient been experiencing any side effects to the medications prescribed?  no  Does the patient measure his/her own blood pressure or blood glucose at home?  yes (glucose)  Does the patient have any problems obtaining medications due to transportation or finances?   no  Understanding of regimen: good Understanding of indications: good Potential of compliance: good    Pharmacist comments: Mr. Carrigan demonstrates a good understanding of his medication regimen, how to take his medicines, and good compliance. He does state he occasionally misses some doses. He reports no issues obtaining his medications or any side effects, and checks his glucose at home.  Wilma Michaelson C. Charod Slawinski, PharmD Clinical Pharmacist-Resident Pager: [phone] Pharmacy: [phone] 03/17/2013 8:25 AM

## 2013-03-20 ENCOUNTER — Telehealth: Payer: Self-pay | Admitting: Internal Medicine

## 2013-03-23 ENCOUNTER — Encounter (HOSPITAL_COMMUNITY)
Admission: RE | Admit: 2013-03-23 | Discharge: 2013-03-23 | Disposition: A | Discharge status: Home / Self Care | Payer: Managed Care, Other (non HMO) | Source: Ambulatory Visit | Attending: Cardiovascular Disease | Admitting: Cardiovascular Disease

## 2013-03-23 ENCOUNTER — Other Ambulatory Visit: Payer: Managed Care, Other (non HMO)

## 2013-03-23 DIAGNOSIS — Z5189 Encounter for other specified aftercare: Secondary | ICD-10-CM | POA: Insufficient documentation

## 2013-03-23 DIAGNOSIS — K5900 Constipation, unspecified: Secondary | ICD-10-CM | POA: Insufficient documentation

## 2013-03-23 DIAGNOSIS — IMO0001 Reserved for inherently not codable concepts without codable children: Secondary | ICD-10-CM | POA: Insufficient documentation

## 2013-03-23 DIAGNOSIS — Z794 Long term (current) use of insulin: Secondary | ICD-10-CM | POA: Insufficient documentation

## 2013-03-23 DIAGNOSIS — I214 Non-ST elevation (NSTEMI) myocardial infarction: Secondary | ICD-10-CM | POA: Insufficient documentation

## 2013-03-23 DIAGNOSIS — Z87898 Personal history of other specified conditions: Secondary | ICD-10-CM | POA: Insufficient documentation

## 2013-03-23 DIAGNOSIS — E785 Hyperlipidemia, unspecified: Secondary | ICD-10-CM | POA: Insufficient documentation

## 2013-03-23 DIAGNOSIS — D62 Acute posthemorrhagic anemia: Secondary | ICD-10-CM | POA: Insufficient documentation

## 2013-03-23 DIAGNOSIS — Z7982 Long term (current) use of aspirin: Secondary | ICD-10-CM | POA: Insufficient documentation

## 2013-03-23 DIAGNOSIS — D696 Thrombocytopenia, unspecified: Secondary | ICD-10-CM | POA: Insufficient documentation

## 2013-03-23 DIAGNOSIS — E1165 Type 2 diabetes mellitus with hyperglycemia: Secondary | ICD-10-CM

## 2013-03-23 DIAGNOSIS — Z9119 Patient's noncompliance with other medical treatment and regimen: Secondary | ICD-10-CM | POA: Insufficient documentation

## 2013-03-23 DIAGNOSIS — Z91199 Patient's noncompliance with other medical treatment and regimen due to unspecified reason: Secondary | ICD-10-CM | POA: Insufficient documentation

## 2013-03-23 DIAGNOSIS — E871 Hypo-osmolality and hyponatremia: Secondary | ICD-10-CM | POA: Insufficient documentation

## 2013-03-23 LAB — GLUCOSE, CAPILLARY
Glucose-Capillary: 143 mg/dL — ABNORMAL HIGH (ref 70–99)
Glucose-Capillary: 141 mg/dL — ABNORMAL HIGH (ref 70–99)

## 2013-03-23 NOTE — Progress Notes (Signed)
Pt started cardiac rehab today.  Pt tolerated light exercise without difficulty. Asymptomatic.  VSS, Telemetry-NSR, twave inversion.  Pt oriented to exercise equipment and routine.  Understanding verbalized.

## 2013-03-25 ENCOUNTER — Encounter (HOSPITAL_COMMUNITY): Payer: Self-pay

## 2013-03-25 ENCOUNTER — Encounter (HOSPITAL_COMMUNITY)
Admission: RE | Admit: 2013-03-25 | Discharge: 2013-03-25 | Disposition: A | Discharge status: Home / Self Care | Payer: Managed Care, Other (non HMO) | Source: Ambulatory Visit | Attending: Cardiovascular Disease | Admitting: Cardiovascular Disease

## 2013-03-25 LAB — GLUCOSE, CAPILLARY: Glucose-Capillary: 168 mg/dL — ABNORMAL HIGH (ref 70–99)

## 2013-03-26 ENCOUNTER — Other Ambulatory Visit (HOSPITAL_BASED_OUTPATIENT_CLINIC_OR_DEPARTMENT_OTHER): Payer: Managed Care, Other (non HMO)

## 2013-03-26 ENCOUNTER — Other Ambulatory Visit: Payer: Self-pay | Admitting: Internal Medicine

## 2013-03-26 DIAGNOSIS — D509 Iron deficiency anemia, unspecified: Secondary | ICD-10-CM

## 2013-03-26 DIAGNOSIS — Z87898 Personal history of other specified conditions: Secondary | ICD-10-CM

## 2013-03-26 LAB — CBC WITH DIFFERENTIAL/PLATELET
BASO%: 0.8 % (ref 0.0–2.0)
Basophils Absolute: 0.1 10*3/uL (ref 0.0–0.1)
EOS%: 8.2 % — ABNORMAL HIGH (ref 0.0–7.0)
Eosinophils Absolute: 0.7 10*3/uL — ABNORMAL HIGH (ref 0.0–0.5)
HEMATOCRIT: 40.6 % (ref 38.4–49.9)
HGB: 13.1 g/dL (ref 13.0–17.1)
LYMPH#: 2.6 10*3/uL (ref 0.9–3.3)
LYMPH%: 31 % (ref 14.0–49.0)
MCH: 24.6 pg — AB (ref 27.2–33.4)
MCHC: 32.2 g/dL (ref 32.0–36.0)
MCV: 76.3 fL — ABNORMAL LOW (ref 79.3–98.0)
MONO#: 0.6 10*3/uL (ref 0.1–0.9)
MONO%: 7 % (ref 0.0–14.0)
NEUT#: 4.5 10*3/uL (ref 1.5–6.5)
NEUT%: 53 % (ref 39.0–75.0)
PLATELETS: 166 10*3/uL (ref 140–400)
RBC: 5.32 10*6/uL (ref 4.20–5.82)
RDW: 16.1 % — ABNORMAL HIGH (ref 11.0–14.6)
WBC: 8.4 10*3/uL (ref 4.0–10.3)

## 2013-03-27 ENCOUNTER — Encounter (HOSPITAL_COMMUNITY)
Admission: RE | Admit: 2013-03-27 | Discharge: 2013-03-27 | Disposition: A | Discharge status: Home / Self Care | Payer: Managed Care, Other (non HMO) | Source: Ambulatory Visit | Attending: Cardiovascular Disease | Admitting: Cardiovascular Disease

## 2013-03-27 LAB — GLUCOSE, CAPILLARY: Glucose-Capillary: 184 mg/dL — ABNORMAL HIGH (ref 70–99)

## 2013-03-27 NOTE — Progress Notes (Signed)
Pt c/o nausea while exercising during first station at cardiac rehab.  Sx relieved with rest and drinking water. BP- 118/70, CBG-184.  Pt able to continue exercise without difficulty.

## 2013-03-30 ENCOUNTER — Encounter (HOSPITAL_COMMUNITY)
Admission: RE | Admit: 2013-03-30 | Discharge: 2013-03-30 | Disposition: A | Discharge status: Home / Self Care | Payer: Managed Care, Other (non HMO) | Source: Ambulatory Visit | Attending: Cardiovascular Disease | Admitting: Cardiovascular Disease

## 2013-04-01 ENCOUNTER — Encounter (HOSPITAL_COMMUNITY): Payer: Managed Care, Other (non HMO)

## 2013-04-03 ENCOUNTER — Encounter (HOSPITAL_COMMUNITY)
Admission: RE | Admit: 2013-04-03 | Discharge: 2013-04-03 | Disposition: A | Discharge status: Home / Self Care | Payer: Managed Care, Other (non HMO) | Source: Ambulatory Visit | Attending: Cardiovascular Disease | Admitting: Cardiovascular Disease

## 2013-04-03 LAB — GLUCOSE, CAPILLARY: Glucose-Capillary: 127 mg/dL — ABNORMAL HIGH (ref 70–99)

## 2013-04-03 NOTE — Progress Notes (Signed)
I have reviewed home exercise with Shanon Brow. The patient was advised to walk 2-4 days per week for 30 minutes, progressing to 45 minutes as he increases his endurance.  Pt stated he is currently walking 30 minutes per day on days he is not coming to [facility].  Pt will also complete one additional day of hand weights outside of CRP II.  Progression of exercise prescription was discussed.  Reviewed THR, pulse, RPE, sign and symptoms and when to call 911 or MD.  Pt voiced understanding.  Archie Endo, MS, ACSM RCEP 04/03/2013 9:09 AM

## 2013-04-06 ENCOUNTER — Encounter (HOSPITAL_COMMUNITY)
Admission: RE | Admit: 2013-04-06 | Discharge: 2013-04-06 | Disposition: A | Discharge status: Home / Self Care | Payer: Managed Care, Other (non HMO) | Source: Ambulatory Visit | Attending: Cardiovascular Disease | Admitting: Cardiovascular Disease

## 2013-04-06 ENCOUNTER — Encounter (HOSPITAL_COMMUNITY): Payer: Managed Care, Other (non HMO)

## 2013-04-07 LAB — GLUCOSE, CAPILLARY: GLUCOSE-CAPILLARY: 158 mg/dL — AB (ref 70–99)

## 2013-04-08 ENCOUNTER — Encounter (HOSPITAL_COMMUNITY): Payer: Managed Care, Other (non HMO)

## 2013-04-08 ENCOUNTER — Ambulatory Visit: Payer: Managed Care, Other (non HMO) | Admitting: Cardiothoracic Surgery

## 2013-04-08 ENCOUNTER — Encounter (HOSPITAL_COMMUNITY)
Admission: RE | Admit: 2013-04-08 | Discharge: 2013-04-08 | Disposition: A | Discharge status: Home / Self Care | Payer: Managed Care, Other (non HMO) | Source: Ambulatory Visit | Attending: Cardiovascular Disease | Admitting: Cardiovascular Disease

## 2013-04-08 NOTE — Progress Notes (Signed)
Pt arrived at cardiac rehab reporting his insulin dosage was changed by his endocrinologist.  He was changed to Novolin N 10units every PM.  Medication list reconciled.

## 2013-04-10 ENCOUNTER — Encounter (HOSPITAL_COMMUNITY)
Admission: RE | Admit: 2013-04-10 | Discharge: 2013-04-10 | Disposition: A | Discharge status: Home / Self Care | Payer: Managed Care, Other (non HMO) | Source: Ambulatory Visit | Attending: Cardiovascular Disease | Admitting: Cardiovascular Disease

## 2013-04-13 ENCOUNTER — Encounter (HOSPITAL_COMMUNITY)
Admission: RE | Admit: 2013-04-13 | Discharge: 2013-04-13 | Disposition: A | Discharge status: Home / Self Care | Payer: Managed Care, Other (non HMO) | Source: Ambulatory Visit | Attending: Cardiovascular Disease | Admitting: Cardiovascular Disease

## 2013-04-14 ENCOUNTER — Other Ambulatory Visit: Payer: Self-pay | Admitting: Surgical

## 2013-04-15 ENCOUNTER — Encounter (HOSPITAL_COMMUNITY)
Admission: RE | Admit: 2013-04-15 | Discharge: 2013-04-15 | Disposition: A | Discharge status: Home / Self Care | Payer: Managed Care, Other (non HMO) | Source: Ambulatory Visit | Attending: Cardiovascular Disease | Admitting: Cardiovascular Disease

## 2013-04-15 ENCOUNTER — Encounter: Payer: Self-pay | Admitting: Cardiothoracic Surgery

## 2013-04-15 ENCOUNTER — Ambulatory Visit (INDEPENDENT_AMBULATORY_CARE_PROVIDER_SITE_OTHER): Payer: Managed Care, Other (non HMO) | Admitting: Cardiothoracic Surgery

## 2013-04-15 VITALS — BP 119/72 | HR 90 | Resp 20 | Ht 70.5 in | Wt 203.0 lb

## 2013-04-15 DIAGNOSIS — Z951 Presence of aortocoronary bypass graft: Secondary | ICD-10-CM

## 2013-04-15 DIAGNOSIS — I2510 Atherosclerotic heart disease of native coronary artery without angina pectoris: Secondary | ICD-10-CM

## 2013-04-15 LAB — GLUCOSE, CAPILLARY
GLUCOSE-CAPILLARY: 85 mg/dL (ref 70–99)
Glucose-Capillary: 115 mg/dL — ABNORMAL HIGH (ref 70–99)

## 2013-04-15 NOTE — Progress Notes (Signed)
PCP is Osa Craver, MD Referring Provider is Osa Craver, MD  Chief Complaint  Patient presents with  . Routine Post Op    73month f/u wound check and disucss RTW date    HPI: 3 month followup after urgent CABG Patient returns today to discuss return to work The patient is doing very well, participating in cardiac rehabilitation. No symptoms of angina or CHF incisions healing well with minimal discomfort  Past Medical History  Diagnosis Date  . Pancreatitis     occasional - last episode 06/2011  . Hyperlipidemia   . Fatty liver disease, nonalcoholic 8/46/9629  . Anemia     occasional - no problems currently(10/02/2011)  . Diabetes mellitus     IDDM  . Bilateral renal cysts 06/16/2011    states no known problems  . Non Hodgkin's lymphoma 1991  . Hypertension     states no dx. of HTN, takes med. to protect kidneys due to DM  . Splenomegaly, congestive, chronic   . Stuffy and runny nose 10/02/2011    yellow drainage from nose  . Loose body in knee 09/2011    loose bodies left knee  . Lateral meniscus tear 09/2011    left  . Blood transfusion without reported diagnosis   . Coronary artery calcification seen on CAT scan   . Chest pain     positive Myoview stress test  . Abnormal cardiovascular stress test 02/06/2013  . CAD (coronary artery disease), LAD 90%, 1st diag 95%, LCX 70% wth AV groove 90%, RCA 40-50% mid and 80% long distal stenosis 02/03/13 02/04/2013  . S/P CABG x 4, 02/05/13 LIMA-LAD; LT. RADIAL-OM;VG-DIAG; VG-PDA 02/06/2013    Past Surgical History  Procedure Laterality Date  . Nasal septum surgery    . Tonsillectomy    . Elbow surgery    . Hernia repair      inguinal   . Tibia bone biopsy  x 3    left  . Knee arthroscopy  10/09/2011    Procedure: ARTHROSCOPY KNEE;  Surgeon: Nita Sells, MD;  Location: [facility];  Service: Orthopedics;  Laterality: Left;  . Coronary artery bypass graft N/A 02/05/2013    Procedure: CORONARY  ARTERY BYPASS GRAFTING (CABG);  Surgeon: Ivin Poot, MD;  Location: [facility];  Service: Open Heart Surgery;  Laterality: N/A;  Coronary artery bypass graft on pump times four using left internal mammary artery and right greater saphenous vein via endovein harvest and left radial artery harvest.   . Intraoperative transesophageal echocardiogram N/A 02/05/2013    Procedure: INTRAOPERATIVE TRANSESOPHAGEAL ECHOCARDIOGRAM;  Surgeon: Ivin Poot, MD;  Location: [facility];  Service: Open Heart Surgery;  Laterality: N/A;  . Radial artery harvest Left 02/05/2013    Procedure: RADIAL ARTERY HARVEST;  Surgeon: Ivin Poot, MD;  Location: [facility] OR;  Service: Vascular;  Laterality: Left;    Family History  Problem Relation Age of Onset  . Adopted: Yes    Social History History  Substance Use Topics  . Smoking status: Never Smoker   . Smokeless tobacco: Never Used  . Alcohol Use: No    Current Outpatient Prescriptions  Medication Sig Dispense Refill  . acetaminophen (TYLENOL) 325 MG tablet Take 650 mg by mouth every 6 (six) hours as needed.      Marland Kitchen aspirin EC 325 MG EC tablet Take 1 tablet (325 mg total) by mouth daily.      . fenofibrate 160 MG tablet Take 160 mg by mouth  daily. PM      . insulin NPH (HUMULIN N,NOVOLIN N) 100 UNIT/ML injection Inject 10 Units into the skin at bedtime.       . insulin regular human CONCENTRATED (HUMULIN R) 500 UNIT/ML SOLN injection Inject 15-30 Units into the skin 2 (two) times daily with a meal. 15 units every morning and 30 units at bedtime      . Insulin Syringe-Needle U-100 (INSULIN SYRINGE 1CC/31GX5/16") 31G X 5/16" 1 ML MISC       . metFORMIN (GLUCOPHAGE) 1000 MG tablet Take 1,000 mg by mouth 2 (two) times daily with a meal.      . metoprolol tartrate (LOPRESSOR) 25 MG tablet Take 1 tablet (25 mg total) by mouth 2 (two) times daily.  60 tablet  1  . Omega-3 Fatty Acids (FISH OIL) 1000 MG CAPS Take 2,000-3,000 mg by mouth daily.      . ONE TOUCH ULTRA TEST  test strip       . ramipril (ALTACE) 2.5 MG capsule Take 2.5 mg by mouth daily.      . simvastatin (ZOCOR) 10 MG tablet Take 1 tablet (10 mg total) by mouth daily at 6 PM.  30 tablet  1   No current facility-administered medications for this visit.    No Known Allergies  Review of Systems following improved diet, good diabetic control, mild numbness over left thumb from radial artery harvest, no sternal clicking or popping with movement  BP 119/72  Pulse 90  Resp 20  Ht 5' 10.5" (1.791 m)  Wt 203 lb (92.08 kg)  BMI 28.71 kg/m2  SpO2 96% Physical Exam Alert and comfortable Lungs clear Heart rate regular without gallop Sternal incision well-healed No pedal edema leg incision is well-healed  Diagnostic Tests: No x-ray today Impression: Excellent clinical recovery after urgent CABG Patient is ready to return to work and a note provided the patient to start February 16  Plan: Return as needed

## 2013-04-17 ENCOUNTER — Encounter (HOSPITAL_COMMUNITY)
Admission: RE | Admit: 2013-04-17 | Discharge: 2013-04-17 | Disposition: A | Discharge status: Home / Self Care | Payer: Managed Care, Other (non HMO) | Source: Ambulatory Visit | Attending: Cardiovascular Disease | Admitting: Cardiovascular Disease

## 2013-04-17 LAB — GLUCOSE, CAPILLARY
GLUCOSE-CAPILLARY: 100 mg/dL — AB (ref 70–99)
Glucose-Capillary: 84 mg/dL (ref 70–99)
Glucose-Capillary: 115 mg/dL — ABNORMAL HIGH (ref 70–99)

## 2013-04-17 NOTE — Progress Notes (Signed)
Juan [name] 48 y.o. male Nutrition Note Spoke with pt.  Nutrition Plan and Nutrition Survey goals reviewed with pt. Pt is following Step 2 of the Therapeutic Lifestyle Changes diet. Pt eats 20 gm fat daily due to pancreatitis. Pt states he wants to lose wt "but I'm going in the opposite direction." Wt loss tips reviewed.  Pt is diabetic. Pt reports his last A1c "a few days ago was 6.2," which is significantly decreased from A1c of 10.2 02/04/13. Per pt, A1c in November was unusual because his eating habits had been unusual due to short-term increased work load. Pt checks CBG's 4-5 times daily. Fasting CBG's 80-176 mg/dL. Pt states fasting CBG "unusual but [name] decreased the NPH insulin from 15 units to 10 units." Pt counts carbs and reports he consumes 30 gm CHO at breakfast, 45-60 gm CHO at lunch and 60-75 gm CHO at dinner. Pt expressed understanding of the information reviewed. Pt aware of nutrition education classes offered and has attended several nutrition classes.  Nutrition Diagnosis   Food-and nutrition-related knowledge deficit related to lack of exposure to information as related to diagnosis of: ? CVD ? DM   Overweight related to excessive energy intake as evidenced by a BMI of 28.8  Nutrition RX/ Estimated Daily Nutrition Needs for: wt loss  1700-2000 Kcal, 45-60 gm fat, 11-15 gm sat fat, 1.7-2.2 gm trans-fat, <1500 mg sodium, 250 gm CHO   Nutrition Intervention   Pt's individual nutrition plan reviewed with pt.   Benefits of adopting Therapeutic Lifestyle Changes discussed when Medficts reviewed.   Pt to attend the Portion Distortion class   Pt to attend the  ? Nutrition I class - met 03/24/13                    ? Nutrition II class        ? Diabetes Blitz class - met 04/07/13       ? Diabetes Q & A class - met 03/27/13   Continue client-centered nutrition education by RD, as part of interdisciplinary care. Goal(s)   Pt to identify food quantities necessary to achieve: ? wt loss  to a goal wt of 179-197 lb (81.5-89.7 kg) at graduation from cardiac rehab.    CBG concentrations in the normal range or as close to normal as is safely possible. Monitor and Evaluate progress toward nutrition goal with team. Nutrition Risk: Change to Moderate Derek Mound, M.Ed, RD, LDN, CDE 04/17/2013 8:56 AM

## 2013-04-20 ENCOUNTER — Encounter (HOSPITAL_COMMUNITY)
Admission: RE | Admit: 2013-04-20 | Discharge: 2013-04-20 | Disposition: A | Discharge status: Home / Self Care | Payer: Managed Care, Other (non HMO) | Source: Ambulatory Visit | Attending: Cardiovascular Disease | Admitting: Cardiovascular Disease

## 2013-04-20 DIAGNOSIS — I252 Old myocardial infarction: Secondary | ICD-10-CM | POA: Insufficient documentation

## 2013-04-20 DIAGNOSIS — I2510 Atherosclerotic heart disease of native coronary artery without angina pectoris: Secondary | ICD-10-CM | POA: Insufficient documentation

## 2013-04-20 DIAGNOSIS — Z5189 Encounter for other specified aftercare: Secondary | ICD-10-CM | POA: Insufficient documentation

## 2013-04-20 DIAGNOSIS — E119 Type 2 diabetes mellitus without complications: Secondary | ICD-10-CM | POA: Insufficient documentation

## 2013-04-20 DIAGNOSIS — Z794 Long term (current) use of insulin: Secondary | ICD-10-CM | POA: Insufficient documentation

## 2013-04-20 DIAGNOSIS — Z7982 Long term (current) use of aspirin: Secondary | ICD-10-CM | POA: Insufficient documentation

## 2013-04-20 DIAGNOSIS — I10 Essential (primary) hypertension: Secondary | ICD-10-CM | POA: Insufficient documentation

## 2013-04-20 DIAGNOSIS — E785 Hyperlipidemia, unspecified: Secondary | ICD-10-CM | POA: Insufficient documentation

## 2013-04-20 LAB — GLUCOSE, CAPILLARY: Glucose-Capillary: 148 mg/dL — ABNORMAL HIGH (ref 70–99)

## 2013-04-22 ENCOUNTER — Encounter (HOSPITAL_COMMUNITY)
Admission: RE | Admit: 2013-04-22 | Discharge: 2013-04-22 | Disposition: A | Discharge status: Home / Self Care | Payer: Managed Care, Other (non HMO) | Source: Ambulatory Visit | Attending: Cardiovascular Disease | Admitting: Cardiovascular Disease

## 2013-04-24 ENCOUNTER — Encounter (HOSPITAL_COMMUNITY)
Admission: RE | Admit: 2013-04-24 | Discharge: 2013-04-24 | Disposition: A | Discharge status: Home / Self Care | Payer: Managed Care, Other (non HMO) | Source: Ambulatory Visit | Attending: Cardiovascular Disease | Admitting: Cardiovascular Disease

## 2013-04-27 ENCOUNTER — Encounter (HOSPITAL_COMMUNITY)
Admission: RE | Admit: 2013-04-27 | Discharge: 2013-04-27 | Disposition: A | Discharge status: Home / Self Care | Payer: Managed Care, Other (non HMO) | Source: Ambulatory Visit | Attending: Cardiovascular Disease | Admitting: Cardiovascular Disease

## 2013-04-29 ENCOUNTER — Encounter (HOSPITAL_COMMUNITY)
Admission: RE | Admit: 2013-04-29 | Discharge: 2013-04-29 | Disposition: A | Discharge status: Home / Self Care | Payer: Managed Care, Other (non HMO) | Source: Ambulatory Visit | Attending: Cardiovascular Disease | Admitting: Cardiovascular Disease

## 2013-05-01 ENCOUNTER — Encounter (HOSPITAL_COMMUNITY)
Admission: RE | Admit: 2013-05-01 | Discharge: 2013-05-01 | Disposition: A | Discharge status: Home / Self Care | Payer: Managed Care, Other (non HMO) | Source: Ambulatory Visit | Attending: Cardiovascular Disease | Admitting: Cardiovascular Disease

## 2013-05-01 ENCOUNTER — Encounter (HOSPITAL_COMMUNITY): Payer: Self-pay

## 2013-05-01 NOTE — Progress Notes (Signed)
Pt graduated from cardiac rehab program today.  Medication list reconciled.  PHQ9 score- 0.  Pt does express stress and anxiety about completing program early to return to work.  Pt has exercise plan in place.  Pt has made significant lifestyle changes and should be commended for his success. Pt plans to continue exercising on his own.  Pt verbalized understanding of home exercise routine and when to notify MD.

## 2013-05-04 ENCOUNTER — Encounter (HOSPITAL_COMMUNITY): Payer: Managed Care, Other (non HMO)

## 2013-05-06 ENCOUNTER — Encounter (HOSPITAL_COMMUNITY): Payer: Managed Care, Other (non HMO)

## 2013-05-08 ENCOUNTER — Encounter (HOSPITAL_COMMUNITY): Payer: Managed Care, Other (non HMO)

## 2013-05-11 ENCOUNTER — Encounter (HOSPITAL_COMMUNITY): Payer: Managed Care, Other (non HMO)

## 2013-05-13 ENCOUNTER — Encounter (HOSPITAL_COMMUNITY): Payer: Managed Care, Other (non HMO)

## 2013-05-15 ENCOUNTER — Encounter (HOSPITAL_COMMUNITY): Payer: Managed Care, Other (non HMO)

## 2013-05-18 ENCOUNTER — Encounter (HOSPITAL_COMMUNITY): Payer: Managed Care, Other (non HMO)

## 2013-05-20 ENCOUNTER — Encounter (HOSPITAL_COMMUNITY): Payer: Managed Care, Other (non HMO)

## 2013-05-22 ENCOUNTER — Encounter (HOSPITAL_COMMUNITY): Payer: Managed Care, Other (non HMO)

## 2013-05-25 ENCOUNTER — Encounter (HOSPITAL_COMMUNITY): Payer: Managed Care, Other (non HMO)

## 2013-05-27 ENCOUNTER — Encounter (HOSPITAL_COMMUNITY): Payer: Managed Care, Other (non HMO)

## 2013-05-29 ENCOUNTER — Encounter (HOSPITAL_COMMUNITY): Payer: Managed Care, Other (non HMO)

## 2013-05-29 ENCOUNTER — Ambulatory Visit (INDEPENDENT_AMBULATORY_CARE_PROVIDER_SITE_OTHER): Payer: Managed Care, Other (non HMO) | Admitting: Cardiovascular Disease

## 2013-05-29 ENCOUNTER — Encounter: Payer: Self-pay | Admitting: Cardiovascular Disease

## 2013-05-29 VITALS — BP 141/85 | HR 87 | Ht 71.0 in | Wt 214.0 lb

## 2013-05-29 DIAGNOSIS — I2510 Atherosclerotic heart disease of native coronary artery without angina pectoris: Secondary | ICD-10-CM

## 2013-05-29 DIAGNOSIS — E785 Hyperlipidemia, unspecified: Secondary | ICD-10-CM

## 2013-05-29 MED ORDER — METOPROLOL SUCCINATE ER 50 MG PO TB24
50.0000 mg | ORAL_TABLET | Freq: Every day | ORAL | Status: DC
Start: 2013-05-29 — End: 2014-01-27

## 2013-05-29 NOTE — Patient Instructions (Signed)
Your physician recommends that you schedule a follow-up appointment in: 6 Months with PA  Your physician recommends that you schedule a follow-up appointment in: 12 months with Dr Gwenlyn Found  Your physician has recommended you make the following change in your medication: Stop Metoprolol Tart 25 mg and start Metoprolol Succinate 50 mg daily

## 2013-05-29 NOTE — Progress Notes (Signed)
05/29/2013 Juan [name]   [birth year]  [id number]  Primary Physician Osa Craver, MD Primary Cardiologist: Lorretta Harp MD [name]   HPI:  The patient returns today for followup. He is a 48 year old mild to moderately overweight, married Caucasian male, father of 1 child who I last saw 3 years ago. He has seen Dr. Sallyanne Kuster twice in 2011. He has a long history of insulin-dependent diabetes as well as extremely high hypertriglyceridemia in the 3000-6000 range with multiple episodes of pancreatitis. He had negative venous Dopplers for DVTs. He does wear compression stockings. He has complained of left inframammary chest pain and increasing shortness of breath. He is not aware of his family history since he was adopted. He had a CT scan of his chest and abdomen which showed a mass at the tail of his pancreas but also showed severe calcification in the LAD. He did have a Myoview stress test performed 2 years ago that was nonischemic. At that time, he had no symptoms of chest pain or shortness of breath. I referred him to Dr. Lavetta Nielsen at [facility] for more intense treatment of his severe hypertriglyceridemia.since I saw him last in December of last several weeks he developed exertional chest pain and shortness of breath.I was concerned that he has developed progression of disease given all of his risk factors.  He had a Myoview stress test performed that showed inferior ischemia which is high risk. He continued to have exertional chest pain with left upper irradiation.I performed cardiac catheterization on him 02/03/13 revealing three-vessel disease and preserved LV function. He subsequently underwent coronary artery bypass bypass grafting x4 by Dr. Tharon Aquas Trigt with a LIMA to his LAD, a left radial to obtuse marginal branch, vein graft to an diagonal branch and PDA. His postop course was uncomplicated. Since I saw him back in December completed 7 weeks  her cardiac rehabilitation and is back to work for one month. He does complain of some chest wall pain but otherwise is asymptomatic.    Current Outpatient Prescriptions  Medication Sig Dispense Refill  . acetaminophen (TYLENOL) 325 MG tablet Take 650 mg by mouth every 6 (six) hours as needed.      Marland Kitchen aspirin EC 325 MG EC tablet Take 1 tablet (325 mg total) by mouth daily.      . fenofibrate 160 MG tablet Take 160 mg by mouth daily. PM      . insulin NPH (HUMULIN N,NOVOLIN N) 100 UNIT/ML injection Inject 10 Units into the skin at bedtime.       . insulin regular human CONCENTRATED (HUMULIN R) 500 UNIT/ML SOLN injection Inject 15-30 Units into the skin 2 (two) times daily with a meal. 15 units every morning and 30 units at bedtime      . Insulin Syringe-Needle U-100 (INSULIN SYRINGE 1CC/31GX5/16") 31G X 5/16" 1 ML MISC       . metFORMIN (GLUCOPHAGE) 1000 MG tablet Take 1,000 mg by mouth 2 (two) times daily with a meal.      . metoprolol tartrate (LOPRESSOR) 25 MG tablet Take 1 tablet (25 mg total) by mouth 2 (two) times daily.  60 tablet  1  . Omega-3 Fatty Acids (FISH OIL) 1000 MG CAPS Take 2,000-3,000 mg by mouth daily.      . ONE TOUCH ULTRA TEST test strip       . ramipril (ALTACE) 2.5 MG capsule Take 2.5 mg by mouth daily.      Marland Kitchen  simvastatin (ZOCOR) 10 MG tablet Take 1 tablet (10 mg total) by mouth daily at 6 PM.  30 tablet  1   No current facility-administered medications for this visit.    No Known Allergies  History   Social History  . Marital Status: Married    Spouse Name: Juan [name]    Number of Children: 1  . 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 Smoking status: Never Smoker   . Smokeless tobacco: Never Used  . Alcohol Use: No  . Drug Use: No  . Sexual Activity: Yes   Other Topics Concern  . Not on file   Social History Narrative   Adopted, so no pertinent FH.  Married.  Lives with wife in  [place].  Independent of ADLs and ambulation.     Review of Systems: General: negative for chills, fever, night sweats or weight changes.  Cardiovascular: negative for chest pain, dyspnea on exertion, edema, orthopnea, palpitations, paroxysmal nocturnal dyspnea or shortness of breath Dermatological: negative for rash Respiratory: negative for cough or wheezing Urologic: negative for hematuria Abdominal: negative for nausea, vomiting, diarrhea, bright red blood per rectum, melena, or hematemesis Neurologic: negative for visual changes, syncope, or dizziness All other systems reviewed and are otherwise negative except as noted above.    Blood pressure 141/85, pulse 87, height 5\' 11"  (1.803 m), weight 214 lb (97.07 kg).  General appearance: alert and no distress Neck: no adenopathy, no carotid bruit, no JVD, supple, symmetrical, trachea midline and thyroid not enlarged, symmetric, no tenderness/mass/nodules Lungs: clear to auscultation bilaterally Heart: regular rate and rhythm, S1, S2 normal, no murmur, click, rub or gallop Extremities: extremities normal, atraumatic, no cyanosis or edema  EKG normal sinus rhythm 87 without ST or T wave changes  ASSESSMENT AND PLAN:   CAD (coronary artery disease), LAD 90%, 1st diag 95%, LCX 70% wth AV groove 90%, RCA 40-50% mid and 80% long distal stenosis 02/03/13 Status post coronary artery bypass grafting 02/03/13 by Dr. Tharon Aquas Tight. He had a LIMA to his LAD, a left radial to obtuse marginal branch, vein to a diagonal branch and PDA. His postop course was uncomplicated. He had normal LV function. He does complain of some chest wall pain but denies otherwise chest pain or shortness of breath.  Hyperlipidemia On statin therapy followed by his PCP      Lorretta Harp MD [facility], [facility] 05/29/2013 1:54 PM

## 2013-05-29 NOTE — Assessment & Plan Note (Signed)
On statin therapy followed by his PCP 

## 2013-05-29 NOTE — Assessment & Plan Note (Signed)
Status post coronary artery bypass grafting 02/03/13 by Dr. Tharon Aquas Tight. He had a LIMA to his LAD, a left radial to obtuse marginal branch, vein to a diagonal branch and PDA. His postop course was uncomplicated. He had normal LV function. He does complain of some chest wall pain but denies otherwise chest pain or shortness of breath.

## 2013-06-01 ENCOUNTER — Encounter (HOSPITAL_COMMUNITY): Payer: Managed Care, Other (non HMO)

## 2013-06-03 ENCOUNTER — Encounter (HOSPITAL_COMMUNITY): Payer: Managed Care, Other (non HMO)

## 2013-06-05 ENCOUNTER — Encounter (HOSPITAL_COMMUNITY): Payer: Managed Care, Other (non HMO)

## 2013-06-08 ENCOUNTER — Encounter (HOSPITAL_COMMUNITY): Payer: Managed Care, Other (non HMO)

## 2013-06-10 ENCOUNTER — Encounter (HOSPITAL_COMMUNITY): Payer: Managed Care, Other (non HMO)

## 2013-06-12 ENCOUNTER — Encounter (HOSPITAL_COMMUNITY): Payer: Managed Care, Other (non HMO)

## 2013-06-15 ENCOUNTER — Encounter (HOSPITAL_COMMUNITY): Payer: Managed Care, Other (non HMO)

## 2013-06-17 ENCOUNTER — Encounter (HOSPITAL_COMMUNITY): Payer: Managed Care, Other (non HMO)

## 2013-06-19 ENCOUNTER — Encounter (HOSPITAL_COMMUNITY): Payer: Managed Care, Other (non HMO)

## 2013-06-22 ENCOUNTER — Encounter (HOSPITAL_COMMUNITY): Payer: Managed Care, Other (non HMO)

## 2013-06-24 ENCOUNTER — Encounter (HOSPITAL_COMMUNITY): Payer: Managed Care, Other (non HMO)

## 2013-06-26 ENCOUNTER — Encounter (HOSPITAL_COMMUNITY): Payer: Managed Care, Other (non HMO)

## 2013-08-12 ENCOUNTER — Other Ambulatory Visit: Payer: Self-pay | Admitting: Cardiothoracic Surgery

## 2013-08-14 ENCOUNTER — Other Ambulatory Visit: Payer: Self-pay | Admitting: Cardiovascular Disease

## 2013-08-14 NOTE — Telephone Encounter (Signed)
Rx was sent to pharmacy electronically. 

## 2013-08-24 ENCOUNTER — Encounter: Payer: Self-pay | Admitting: Podiatry

## 2013-08-24 ENCOUNTER — Telehealth: Payer: Self-pay | Admitting: Internal Medicine

## 2013-08-24 ENCOUNTER — Other Ambulatory Visit (HOSPITAL_BASED_OUTPATIENT_CLINIC_OR_DEPARTMENT_OTHER): Payer: Managed Care, Other (non HMO)

## 2013-08-24 ENCOUNTER — Ambulatory Visit (HOSPITAL_BASED_OUTPATIENT_CLINIC_OR_DEPARTMENT_OTHER): Payer: Managed Care, Other (non HMO) | Admitting: Internal Medicine

## 2013-08-24 ENCOUNTER — Other Ambulatory Visit: Payer: Self-pay | Admitting: Internal Medicine

## 2013-08-24 ENCOUNTER — Ambulatory Visit (INDEPENDENT_AMBULATORY_CARE_PROVIDER_SITE_OTHER): Payer: Managed Care, Other (non HMO) | Admitting: Podiatry

## 2013-08-24 VITALS — BP 136/87 | HR 90 | Resp 16 | Ht 71.0 in | Wt 214.0 lb

## 2013-08-24 VITALS — BP 154/86 | HR 101 | Temp 97.9°F | Resp 20 | Ht 71.0 in | Wt 214.4 lb

## 2013-08-24 DIAGNOSIS — D509 Iron deficiency anemia, unspecified: Secondary | ICD-10-CM

## 2013-08-24 DIAGNOSIS — Z87898 Personal history of other specified conditions: Secondary | ICD-10-CM

## 2013-08-24 DIAGNOSIS — D696 Thrombocytopenia, unspecified: Secondary | ICD-10-CM

## 2013-08-24 DIAGNOSIS — D473 Essential (hemorrhagic) thrombocythemia: Secondary | ICD-10-CM

## 2013-08-24 DIAGNOSIS — R161 Splenomegaly, not elsewhere classified: Secondary | ICD-10-CM

## 2013-08-24 DIAGNOSIS — L03039 Cellulitis of unspecified toe: Secondary | ICD-10-CM

## 2013-08-24 DIAGNOSIS — K7689 Other specified diseases of liver: Secondary | ICD-10-CM

## 2013-08-24 DIAGNOSIS — K769 Liver disease, unspecified: Secondary | ICD-10-CM

## 2013-08-24 LAB — COMPREHENSIVE METABOLIC PANEL
ALT: 86 U/L — ABNORMAL HIGH (ref 0–53)
AST: 71 U/L — ABNORMAL HIGH (ref 0–37)
Albumin: 4 g/dL (ref 3.5–5.2)
Alkaline Phosphatase: 65 U/L (ref 39–117)
BUN: 16 mg/dL (ref 6–23)
CALCIUM: 11.1 mg/dL — AB (ref 8.4–10.5)
CHLORIDE: 98 meq/L (ref 96–112)
CO2: 23 meq/L (ref 19–32)
Creatinine, Ser: 1.01 mg/dL (ref 0.50–1.35)
GLUCOSE: 312 mg/dL — AB (ref 70–99)
POTASSIUM: 5 meq/L (ref 3.5–5.3)
Sodium: 137 mEq/L (ref 135–145)
TOTAL PROTEIN: 8.5 g/dL — AB (ref 6.0–8.3)
Total Bilirubin: 0.2 mg/dL — ABNORMAL LOW (ref 0.2–1.2)

## 2013-08-24 LAB — IRON AND TIBC
%SAT: 17 % — ABNORMAL LOW (ref 20–55)
Iron: 63 ug/dL (ref 42–165)
TIBC: 380 ug/dL (ref 215–435)
UIBC: 317 ug/dL (ref 125–400)

## 2013-08-24 LAB — CBC WITH DIFFERENTIAL/PLATELET
BASO%: 1.4 % (ref 0.0–2.0)
BASOS ABS: 0.1 10*3/uL (ref 0.0–0.1)
EOS%: 6.6 % (ref 0.0–7.0)
Eosinophils Absolute: 0.4 10*3/uL (ref 0.0–0.5)
HEMATOCRIT: 41.8 % (ref 38.4–49.9)
HEMOGLOBIN: 14.4 g/dL (ref 13.0–17.1)
LYMPH#: 1.8 10*3/uL (ref 0.9–3.3)
LYMPH%: 27.3 % (ref 14.0–49.0)
MCH: 27.1 pg — ABNORMAL LOW (ref 27.2–33.4)
MCHC: 34.4 g/dL (ref 32.0–36.0)
MCV: 78.7 fL — ABNORMAL LOW (ref 79.3–98.0)
MONO#: 0.5 10*3/uL (ref 0.1–0.9)
MONO%: 7.1 % (ref 0.0–14.0)
NEUT#: 3.7 10*3/uL (ref 1.5–6.5)
NEUT%: 57.6 % (ref 39.0–75.0)
Platelets: 130 10*3/uL — ABNORMAL LOW (ref 140–400)
RBC: 5.31 10*6/uL (ref 4.20–5.82)
RDW: 15 % — ABNORMAL HIGH (ref 11.0–14.6)
WBC: 6.4 10*3/uL (ref 4.0–10.3)

## 2013-08-24 LAB — LACTATE DEHYDROGENASE: LDH: 146 U/L (ref 94–250)

## 2013-08-24 LAB — FERRITIN: Ferritin: 80 ng/mL (ref 22–322)

## 2013-08-24 NOTE — Progress Notes (Deleted)
[facility] OFFICE PROGRESS NOTE  Osa Craver, MD [place] [address]  DIAGNOSIS: Microcytic anemia  Chief Complaint  Patient presents with  . Thrombocytopenia    CURRENT TREATMENT:   No history exists.     INTERVAL HISTORY: Juan [name] 48 y.o. male ***   MEDICAL HISTORY: Past Medical History  Diagnosis Date  . Pancreatitis     occasional - last episode 06/2011  . Hyperlipidemia   . Fatty liver disease, nonalcoholic 0/93/2355  . Anemia     occasional - no problems currently(10/02/2011)  . Diabetes mellitus     IDDM  . Bilateral renal cysts 06/16/2011    states no known problems  . Non Hodgkin's lymphoma 1991  . Hypertension     states no dx. of HTN, takes med. to protect kidneys due to DM  . Splenomegaly, congestive, chronic   . Stuffy and runny nose 10/02/2011    yellow drainage from nose  . Loose body in knee 09/2011    loose bodies left knee  . Lateral meniscus tear 09/2011    left  . Blood transfusion without reported diagnosis   . Coronary artery calcification seen on CAT scan   . Chest pain     positive Myoview stress test  . Abnormal cardiovascular stress test 02/06/2013  . CAD (coronary artery disease), LAD 90%, 1st diag 95%, LCX 70% wth AV groove 90%, RCA 40-50% mid and 80% long distal stenosis 02/03/13 02/04/2013  . S/P CABG x 4, 02/05/13 LIMA-LAD; LT. RADIAL-OM;VG-DIAG; VG-PDA 02/06/2013    INTERIM HISTORY: has Chronic pancreatitis; Hyperlipidemia; Hyperkalemia; Acute kidney injury; Dehydration; DM2 (diabetes mellitus, type 2); Microcytic anemia; Fatty liver disease, nonalcoholic; Bilateral renal cysts; Splenomegaly; Thrombocytopenia; Other malignant lymphomas, unspecified site, extranodal and solid organ sites; Coronary artery calcification seen on CAT scan; CAD (coronary artery disease), LAD 90%, 1st diag 95%, LCX 70% wth AV groove 90%, RCA 40-50% mid and 80% long distal stenosis 02/03/13; Abnormal  cardiovascular stress test; S/P CABG x 4, 02/05/13 LIMA-LAD; LT. RADIAL-OM;VG-DIAG; VG-PDA; Hyperchylomicronemia; Personal history of lymphatic and hematopoietic neoplasm; Personal history of digestive disease; and Chronic nonalcoholic liver disease on his problem list.    ALLERGIES:  has No Known Allergies.  MEDICATIONS: has a current medication list which includes the following prescription(s): acetaminophen, aspirin, fenofibrate, insulin nph human, insulin regular human concentrated, insulin syringe 1cc/31gx5/16", metformin, metoprolol succinate, fish oil, one touch ultra test, ramipril, and simvastatin.  SURGICAL HISTORY:  Past Surgical History  Procedure Laterality Date  . Nasal septum surgery    . Tonsillectomy    . Elbow surgery    . Hernia repair      inguinal   . Tibia bone biopsy  x 3    left  . Knee arthroscopy  10/09/2011    Procedure: ARTHROSCOPY KNEE;  Surgeon: Nita Sells, MD;  Location: [facility];  Service: Orthopedics;  Laterality: Left;  . Coronary artery bypass graft N/A 02/05/2013    Procedure: CORONARY ARTERY BYPASS GRAFTING (CABG);  Surgeon: Ivin Poot, MD;  Location: [facility];  Service: Open Heart Surgery;  Laterality: N/A;  Coronary artery bypass graft on pump times four using left internal mammary artery and right greater saphenous vein via endovein harvest and left radial artery harvest.   . Intraoperative transesophageal echocardiogram N/A 02/05/2013    Procedure: INTRAOPERATIVE TRANSESOPHAGEAL ECHOCARDIOGRAM;  Surgeon: Ivin Poot, MD;  Location: [facility];  Service: Open Heart Surgery;  Laterality: N/A;  .  Radial artery harvest Left 02/05/2013    Procedure: RADIAL ARTERY HARVEST;  Surgeon: Ivin Poot, MD;  Location: [facility];  Service: Vascular;  Laterality: Left;    REVIEW OF SYSTEMS:   Constitutional: Denies fevers, chills or abnormal weight loss Eyes: Denies blurriness of vision Ears, nose, mouth, throat, and face: Denies  mucositis or sore throat Respiratory: Denies cough, dyspnea or wheezes Cardiovascular: Denies palpitation, chest discomfort or lower extremity swelling Gastrointestinal:  Denies nausea, heartburn or change in bowel habits Skin: Denies abnormal skin rashes Lymphatics: Denies new lymphadenopathy or easy bruising Neurological:Denies numbness, tingling or new weaknesses Behavioral/Psych: Mood is stable, no new changes  All other systems were reviewed with the patient and are negative.  PHYSICAL EXAMINATION: ECOG PERFORMANCE STATUS: {CHL ONC ECOG PS:[id number]}  Blood pressure 154/86, pulse 101, temperature 97.9 F (36.6 C), temperature source Oral, resp. rate 20, height 5\' 11"  (1.803 m), weight 214 lb 6.4 oz (97.251 kg).  GENERAL:alert, no distress and comfortable SKIN: skin color, texture, turgor are normal, no rashes or significant lesions EYES: normal, Conjunctiva are pink and non-injected, sclera clear OROPHARYNX:no exudate, no erythema and lips, buccal mucosa, and tongue normal  NECK: supple, thyroid normal size, non-tender, without nodularity LYMPH:  no palpable lymphadenopathy in the cervical, axillary or supraclavicular LUNGS: clear to auscultation with normal breathing effort, no wheezes or rhonchi HEART: regular rate & rhythm and no murmurs and no lower extremity edema ABDOMEN:abdomen soft, non-tender and normal bowel sounds Musculoskeletal:no cyanosis of digits and no clubbing  NEURO: alert & oriented x 3 with fluent speech, no focal motor/sensory deficits  Labs:  Lab Results  Component Value Date   WBC 6.4 08/24/2013   HGB 14.4 08/24/2013   HCT 41.8 08/24/2013   MCV 78.7* 08/24/2013   PLT 130* 08/24/2013   NEUTROABS 3.7 08/24/2013      Chemistry      Component Value Date/Time   NA 132* 02/08/2013 0523   NA 139 08/25/2012 0936   K 4.8 02/08/2013 0523   K 4.4 08/25/2012 0936   CL 99 02/08/2013 0523   CL 104 08/25/2012 0936   CO2 25 02/08/2013 0523   CO2 25 08/25/2012 0936   BUN  24* 02/08/2013 0523   BUN 16.3 08/25/2012 0936   CREATININE 0.96 02/08/2013 0523   CREATININE 1.01 02/02/2013 1116   CREATININE 0.9 08/25/2012 0936      Component Value Date/Time   CALCIUM 10.7* 02/08/2013 0523   CALCIUM 10.8* 08/25/2012 0936   CALCIUM 9.5 09/28/2009 0800   ALKPHOS 69 02/04/2013 0940   ALKPHOS 57 08/25/2012 0936   AST 24 02/04/2013 0940   AST 25 08/25/2012 0936   ALT 34 02/04/2013 0940   ALT 50 08/25/2012 0936   BILITOT 0.4 02/04/2013 0940   BILITOT 0.36 08/25/2012 0936       Basic Metabolic Panel: No results found for this basename: NA, K, CL, CO2, GLUCOSE, BUN, CREATININE, CALCIUM, MG, PHOS,  in the last 168 hours GFR Estimated Creatinine Clearance: 114.4 ml/min (by C-G formula based on Cr of 0.96). Liver Function Tests: No results found for this basename: AST, ALT, ALKPHOS, BILITOT, PROT, ALBUMIN,  in the last 168 hours No results found for this basename: LIPASE, AMYLASE,  in the last 168 hours No results found for this basename: AMMONIA,  in the last 168 hours Coagulation profile No results found for this basename: INR, PROTIME,  in the last 168 hours  CBC:  Recent Labs Lab 08/24/13 0839  WBC 6.4  NEUTROABS 3.7  HGB 14.4  HCT 41.8  MCV 78.7*  PLT 130*    Anemia work up No results found for this basename: VITAMINB12, FOLATE, FERRITIN, TIBC, IRON, RETICCTPCT,  in the last 72 hours  Studies:  No results found.   RADIOGRAPHIC STUDIES: No results found.  ASSESSMENT: Juan [name] 48 y.o. male with a history of Microcytic anemia***   PLAN: ***   All questions were answered. The patient knows to call the clinic with any problems, questions or concerns. We can certainly see the patient much sooner if necessary.  I spent {CHL ONC TIME VISIT - TRZNB:[id number]} counseling the patient face to face. The total time spent in the appointment was {CHL ONC TIME VISIT - DTHYH:[id number]}.    Concha Norway, MD 08/24/2013 9:07 AM

## 2013-08-24 NOTE — Progress Notes (Signed)
[facility] OFFICE PROGRESS NOTE  Osa Craver, MD [place] [address]  DIAGNOSIS: Microcytic anemia - Plan: CBC with Differential, Ferritin, Iron and TIBC [facility], CBC with Differential, Lactate dehydrogenase (LDH) - [facility], Comprehensive metabolic panel (Cmet) - [facility], Ferritin, Iron and TIBC [facility]  Chronic nonalcoholic liver disease  Splenomegaly - Plan: CBC with Differential, CBC with Differential, Lactate dehydrogenase (LDH) - [facility], Comprehensive metabolic panel (Cmet) - [facility]  Thrombocytopenia - Plan: CBC with Differential, CBC with Differential, Lactate dehydrogenase (LDH) - [facility], Comprehensive metabolic panel (Cmet) - [facility]  Chief Complaint  Patient presents with  . Thrombocytopenia    CURRENT TREATMENT: Observation.   INTERVAL HISTORY: Juan [name] 48 y.o. male with a history of thrombocytopenia in conjunction with splenomegaly and a remote history of non-Hodgkin lymphoma treated at the [facility] in 1990 without evidence of recurrence is here for follow up.  He notes that he has some bleeding related to an ingrown toenail on his right big toe.  He has scheduled a follow up with the podiatrist this morning.  Otherwise, he is without complaints. He is working full-time at [employer].  He denies any constitutional symptoms such as fevers or chills or dyspnea.  He denies any bleeding including hematochezia, melena or hematuria. He had a CABG x 4 vessel this November 2014.  He reports that he had dyspnea with exertion and then pain in his left arm and followed with Cardiology (Dr. Gwenlyn Found) and had stress test which was positive followed by a Left heart cath demonstrating 3 blockages.  He recovered without complication.  He some incisional pain that is intermittent but otherwise no further chest pain.   MEDICAL HISTORY: Past Medical History  Diagnosis Date  . Pancreatitis     occasional - last episode 06/2011  . Hyperlipidemia   . Fatty liver disease,  nonalcoholic 3/55/9741  . Anemia     occasional - no problems currently(10/02/2011)  . Diabetes mellitus     IDDM  . Bilateral renal cysts 06/16/2011    states no known problems  . Non Hodgkin's lymphoma 1991  . Hypertension     states no dx. of HTN, takes med. to protect kidneys due to DM  . Splenomegaly, congestive, chronic   . Stuffy and runny nose 10/02/2011    yellow drainage from nose  . Loose body in knee 09/2011    loose bodies left knee  . Lateral meniscus tear 09/2011    left  . Blood transfusion without reported diagnosis   . Coronary artery calcification seen on CAT scan   . Chest pain     positive Myoview stress test  . Abnormal cardiovascular stress test 02/06/2013  . CAD (coronary artery disease), LAD 90%, 1st diag 95%, LCX 70% wth AV groove 90%, RCA 40-50% mid and 80% long distal stenosis 02/03/13 02/04/2013  . S/P CABG x 4, 02/05/13 LIMA-LAD; LT. RADIAL-OM;VG-DIAG; VG-PDA 02/06/2013    INTERIM HISTORY: has Chronic pancreatitis; Hyperlipidemia; Hyperkalemia; Acute kidney injury; Dehydration; DM2 (diabetes mellitus, type 2); Microcytic anemia; Fatty liver disease, nonalcoholic; Bilateral renal cysts; Splenomegaly; Thrombocytopenia; Other malignant lymphomas, unspecified site, extranodal and solid organ sites; Coronary artery calcification seen on CAT scan; CAD (coronary artery disease), LAD 90%, 1st diag 95%, LCX 70% wth AV groove 90%, RCA 40-50% mid and 80% long distal stenosis 02/03/13; Abnormal cardiovascular stress test; S/P CABG x 4, 02/05/13 LIMA-LAD; LT. RADIAL-OM;VG-DIAG; VG-PDA; Hyperchylomicronemia; Personal history of lymphatic and hematopoietic neoplasm; Personal history of digestive disease; and Chronic  nonalcoholic liver disease on his problem list.    ALLERGIES:  has No Known Allergies.  MEDICATIONS: has a current medication list which includes the following prescription(s): acetaminophen, aspirin, fenofibrate, insulin nph human, insulin regular human  concentrated, insulin syringe 1cc/31gx5/16", metformin, metoprolol succinate, fish oil, one touch ultra test, ramipril, and simvastatin.  SURGICAL HISTORY:  Past Surgical History  Procedure Laterality Date  . Nasal septum surgery    . Tonsillectomy    . Elbow surgery    . Hernia repair      inguinal   . Tibia bone biopsy  x 3    left  . Knee arthroscopy  10/09/2011    Procedure: ARTHROSCOPY KNEE;  Surgeon: Nita Sells, MD;  Location: [facility];  Service: Orthopedics;  Laterality: Left;  . Coronary artery bypass graft N/A 02/05/2013    Procedure: CORONARY ARTERY BYPASS GRAFTING (CABG);  Surgeon: Ivin Poot, MD;  Location: [facility];  Service: Open Heart Surgery;  Laterality: N/A;  Coronary artery bypass graft on pump times four using left internal mammary artery and right greater saphenous vein via endovein harvest and left radial artery harvest.   . Intraoperative transesophageal echocardiogram N/A 02/05/2013    Procedure: INTRAOPERATIVE TRANSESOPHAGEAL ECHOCARDIOGRAM;  Surgeon: Ivin Poot, MD;  Location: [facility];  Service: Open Heart Surgery;  Laterality: N/A;  . Radial artery harvest Left 02/05/2013    Procedure: RADIAL ARTERY HARVEST;  Surgeon: Ivin Poot, MD;  Location: [facility];  Service: Vascular;  Laterality: Left;   PROBLEM LIST:  1. Non-Hodgkin lymphoma involving the left tibia diagnosed on 09/21/1988 at [facility] in [place]. Bone marrow biopsy and liver biopsy in 1990 were negative. The patient underwent 1 cycle of Promace MOPP in 1990 followed by 8 cycles of Promace CytaBOM from 10/1988 - 01/1989. He had radiation therapy from 10/1988 - 01/1989. 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 He  has remained disease-free since completing therapy in late 1990.  NIH patient number was [id number].  2. Left tibia bone biopsy x 3  3. Thrombocytopenia suspected to be on the  basis of chronic ITP.  4. DM type 2  5. Splenomegaly suspected to be secondary to splenic vein thrombosis  possibly related to previous episodes of chronic pancreatitis.  6. Chronic pancreatitis  7. Hyperlipidemia/hypertriglyceridemia  8. Severe hepatic steatosis  9. Hypercalcemia  10. Diastasis recti  11. Recurrent cellulitis of the right lower extremity  12. Possible splenic vein thrombosis found on MRI of the abdomen on 02/07/2012.  13. Bilateral renal cysts found on MRI of the abdomen on 02/07/2012.  14. Pseudocyst/complex cystic lesion on tail of pancreas found on abdominal CT on 02/04/2012.  15. Varices noted within left upper quadrant on abdominal CT on 02/04/2012.  REVIEW OF SYSTEMS:   Constitutional: Denies fevers, chills or abnormal weight loss Eyes: Denies blurriness of vision Ears, nose, mouth, throat, and face: Denies mucositis or sore throat Respiratory: Denies cough, dyspnea or wheezes Cardiovascular: Denies palpitation, chest discomfort or lower extremity swelling Gastrointestinal:  Denies nausea, heartburn or change in bowel habits Skin: Denies abnormal skin rashes Lymphatics: Denies new lymphadenopathy or easy bruising Neurological:Denies numbness, tingling or new weaknesses Behavioral/Psych: Mood is stable, no new changes  All other systems were reviewed with the patient and are negative.  PHYSICAL EXAMINATION: ECOG PERFORMANCE STATUS: 0 - Asymptomatic  Blood pressure 154/86, pulse 101, temperature 97.9 F (36.6  C), temperature source Oral, resp. rate 20, height 5' 11" (1.803 m), weight 214 lb 6.4 oz (97.251 kg).  GENERAL:alert, no distress and comfortable; moderately overweight SKIN: skin color, texture, turgor are normal, no rashes or significant lesions; whitish dryness around pinnacle of left ear.  EYES: normal, Conjunctiva are pink and non-injected, sclera clear OROPHARYNX:no exudate, no erythema and lips, buccal mucosa, and tongue normal  NECK: supple,  thyroid normal size, non-tender, without nodularity LYMPH:  no palpable lymphadenopathy in the cervical, axillary or supraclavicular LUNGS: clear to auscultation with normal breathing effort, no wheezes or rhonchi HEART: regular rate & rhythm and no murmurs and no lower extremity edema; chest mid-line scar that is well healed.  ABDOMEN:abdomen soft, non-tender and normal bowel sounds Musculoskeletal:no cyanosis of digits and no clubbing; R toe swollen with erythrema around toe nail bed.  NEURO: alert & oriented x 3 with fluent speech, no focal motor/sensory deficits  Labs:  Lab Results  Component Value Date   WBC 6.4 08/24/2013   HGB 14.4 08/24/2013   HCT 41.8 08/24/2013   MCV 78.7* 08/24/2013   PLT 130* 08/24/2013   NEUTROABS 3.7 08/24/2013      Chemistry      Component Value Date/Time   NA 132* 02/08/2013 0523   NA 139 08/25/2012 0936   K 4.8 02/08/2013 0523   K 4.4 08/25/2012 0936   CL 99 02/08/2013 0523   CL 104 08/25/2012 0936   CO2 25 02/08/2013 0523   CO2 25 08/25/2012 0936   BUN 24* 02/08/2013 0523   BUN 16.3 08/25/2012 0936   CREATININE 0.96 02/08/2013 0523   CREATININE 1.01 02/02/2013 1116   CREATININE 0.9 08/25/2012 0936      Component Value Date/Time   CALCIUM 10.7* 02/08/2013 0523   CALCIUM 10.8* 08/25/2012 0936   CALCIUM 9.5 09/28/2009 0800   ALKPHOS 69 02/04/2013 0940   ALKPHOS 57 08/25/2012 0936   AST 24 02/04/2013 0940   AST 25 08/25/2012 0936   ALT 34 02/04/2013 0940   ALT 50 08/25/2012 0936   BILITOT 0.4 02/04/2013 0940   BILITOT 0.36 08/25/2012 0936     CBC:  Recent Labs Lab 08/24/13 0839  WBC 6.4  NEUTROABS 3.7  HGB 14.4  HCT 41.8  MCV 78.7*  PLT 130*    Anemia work up No results found for this basename: VITAMINB12, FOLATE, FERRITIN, TIBC, IRON, RETICCTPCT,  in the last 72 hours  Studies:  No results found.   RADIOGRAPHIC STUDIES: 1. US abdomen complete on 06/15/2011 showed no gallstones are noted within gallbladder. Normal CBD. Again noted fatty infiltration  of the liver. Significant splenomegaly. Bilateral renal cyst. No hydronephrosis or diagnostic renal calculus. Splenic volume was 1418.7 mL. The spleen measured 17.5 cm in length.  2. Portable chest x-ray on 06/16/2011 showed low volumes with new atelectasis or infiltrate at the left lung base.  3. CT of the chest with contrast on 02/04/2012 showed no active infiltrate or effusion. Minimal peribronchovascular nodularity in the periphery of the right upper lobe may be residual scarring from prior infection, but atypical infection cannot be excluded. Age advanced coronary artery calcifications in the distribution of the left anterior descending coronary artery.  4. CT of the abdomen with contrast on 02/04/2012 showed severe fatty infiltration of the liver with areas of sparing. No ductal dilatation.  Moderate splenomegaly with varices in the left upper quadrant. Low attenuation lesion 2.3 x 3.5 x 2.3 cm in the tail of the pancreas may represent pseudocyst from  prior pancreatitis. A cystic pancreatic neoplasm cannot be excluded with this exam. Correlation with patient history is recommended as well as follow-up CT or preferably MRI to assess further.  5. MRI of the abdomen with and without contrast on 02/07/2012 showed a 2.9 cm complex cystic lesion with layering debris along the pancreatic tail, most suggestive of a pancreatic pseudocyst. Splenomegaly was present with the spleen measuring 20.1 cm in maximum dimension. There was suspected splenic vein thrombosis with associated perisplenic collaterals. The presence of well-developed collaterals suggests that this is not acute. Severe hepatic steatosis. 3.1 x 3.7 cm right renal cyst (series 8/image 39). Additional small bilateral renal cysts measuring up to 12 mm. No hydronephrosis.    ASSESSMENT: Juan [name] 48 y.o. male with a history of Microcytic anemia - Plan: CBC with Differential, Ferritin, Iron and TIBC [facility], CBC with Differential, Lactate dehydrogenase  (LDH) - [facility], Comprehensive metabolic panel (Cmet) - [facility], Ferritin, Iron and TIBC [facility]  Chronic nonalcoholic liver disease  Splenomegaly - Plan: CBC with Differential, CBC with Differential, Lactate dehydrogenase (LDH) - [facility], Comprehensive metabolic panel (Cmet) - [facility]  Thrombocytopenia - Plan: CBC with Differential, CBC with Differential, Lactate dehydrogenase (LDH) - [facility], Comprehensive metabolic panel (Cmet) - [facility]   PLAN:   1. Chronic ITP complicated by splenomegaly.  --Clinically, the patient is doing well. His plt count is 130 today.    2. History of NHL (1990) --He has no symptoms to suggest disease recurrence.  The patient states that he had a bone marrow at the time of the diagnosis of his lymphoma in 1990. The bone marrow at that time was negative. The  patient was reassured. Patient's chemistries are pending.   3. Follow up.  -- I have set him up for CBC to be done in 6 months. At his request, we will have the patient come back in 1 year for CBC, chemistries, and iron studies.   All questions were answered. The patient knows to call the clinic with any problems, questions or concerns. We can certainly see the patient much sooner if necessary.  I spent 15 minutes counseling the patient face to face. The total time spent in the appointment was 25 minutes.    Concha Norway, MD 08/24/2013 10:09 AM

## 2013-08-24 NOTE — Progress Notes (Signed)
CMP     Component Value Date/Time   NA 137 08/24/2013 0926   NA 139 08/25/2012 0936   K 5.0 08/24/2013 0926   K 4.4 08/25/2012 0936   CL 98 08/24/2013 0926   CL 104 08/25/2012 0936   CO2 23 08/24/2013 0926   CO2 25 08/25/2012 0936   GLUCOSE 312* 08/24/2013 0926   GLUCOSE 125* 08/25/2012 0936   BUN 16 08/24/2013 0926   BUN 16.3 08/25/2012 0936   CREATININE 1.01 08/24/2013 0926   CREATININE 1.01 02/02/2013 1116   CREATININE 0.9 08/25/2012 0936   CALCIUM 11.1* 08/24/2013 0926   CALCIUM 10.8* 08/25/2012 0936   CALCIUM 9.5 09/28/2009 0800   PROT 8.5* 08/24/2013 0926   PROT 7.8 08/25/2012 0936   ALBUMIN 4.0 08/24/2013 0926   ALBUMIN 3.7 08/25/2012 0936   AST 71* 08/24/2013 0926   AST 25 08/25/2012 0936   ALT 86* 08/24/2013 0926   ALT 50 08/25/2012 0936   ALKPHOS 65 08/24/2013 0926   ALKPHOS 57 08/25/2012 0936   BILITOT 0.2* 08/24/2013 0926   BILITOT 0.36 08/25/2012 0936   GFRNONAA >90 02/08/2013 0523   GFRAA >90 02/08/2013 0523   Labs reviewed.  He still has mildly elevated calcium.  He is asymptomatic and was instructed to follow up with her PCP.  He might require additional hypercalcemia workup.

## 2013-08-24 NOTE — Progress Notes (Signed)
Subjective:     Patient ID: Juan [name], male   DOB: [birth year], 48 y.o.   MRN: [id number]  HPI patient presents with red discoloration and drainage right hallux lateral border this painful when pressed patient has undergone triple bypass surgery   Review of Systems     Objective:   Physical Exam Neurovascular status unchanged with left hallux lateral border being incurvated red with drainage noted of the localized nature    Assessment:     Paronychia infection left hallux localized    Plan:     Discussed condition in relationship to other issues as far as his body does have recommended removal of the corner allowing it to drain and then followed by permanent procedure of which patient wants. I infiltrated 60 mg Xylocaine Marcaine mixture remove the lateral border all proud flesh and abscess tissue then applied sterile dressing. Reappoint for [place] to recheck for permanent procedure in 2 weeks

## 2013-08-24 NOTE — Telephone Encounter (Signed)
Gave pt appt for lab and Md for next year 2016 and december for labs

## 2013-08-24 NOTE — Patient Instructions (Signed)

## 2013-08-27 ENCOUNTER — Telehealth: Payer: Self-pay | Admitting: *Deleted

## 2013-08-27 NOTE — Telephone Encounter (Signed)
Saw him on Monday, he cut part of an ingrown toenail.  The swelling is going down, infection is lessening.  It looks like a big hang nail is sticking out of the wound, it's really painful to the touch.

## 2013-08-27 NOTE — Telephone Encounter (Signed)
Not nail. Probably tissue. Should settle down

## 2013-08-27 NOTE — Telephone Encounter (Signed)
I called and gave him Dr. Mellody Drown response that it's not nail, probably tissue.  It should settle down.  He stated it sure feels like a hang nail but I'll keep an eye on it.

## 2013-09-07 ENCOUNTER — Encounter: Payer: Self-pay | Admitting: Podiatry

## 2013-09-07 ENCOUNTER — Ambulatory Visit (INDEPENDENT_AMBULATORY_CARE_PROVIDER_SITE_OTHER): Payer: Managed Care, Other (non HMO) | Admitting: Podiatry

## 2013-09-07 VITALS — BP 135/78 | HR 78 | Resp 16

## 2013-09-07 DIAGNOSIS — L600 Ingrowing nail: Secondary | ICD-10-CM

## 2013-09-07 NOTE — Progress Notes (Signed)
   Subjective:    Patient ID: Juan [name], male    DOB: May 15, 1965, 48 y.o.   MRN: [id number]  HPI Pt is here for f/u ingrown removal to right great toe. He stated that the " hang nail fell out 2 days ago" Drainage has stopped and he denies pain. Mild erythema noted, no drainage, no s/s of infection   Review of Systems     Objective:   Physical Exam        Assessment & Plan:

## 2013-09-07 NOTE — Progress Notes (Signed)
Subjective:     Patient ID: Juan [name], male   DOB: 06/18/1965, 48 y.o.   MRN: [id number]  HPI patient presents stating the nail is doing much better and I am ready to have it fixed   Review of Systems     Objective:   Physical Exam Neurovascular status is intact with patient having a well-healed surgical site right hallux lateral border from infected nail 2 weeks ago    Assessment:     Healing paronychia infection of the right big toe 2 weeks from when it was first treated    Plan:     Reviewed correction and recommended removal of the corner. Explained the procedure and risk and patient wants surgery. Infiltrated 60 mg Xylocaine Marcaine mixture remove the lateral border removed proud flesh and tissue and exposed the matrix and applied phenol 3 applications 30 seconds followed by alcohol lavaged and sterile dressing. Gave instructions on soaks and reappoint

## 2013-09-07 NOTE — Patient Instructions (Signed)

## 2013-12-03 ENCOUNTER — Encounter: Payer: Self-pay | Admitting: *Deleted

## 2013-12-04 ENCOUNTER — Ambulatory Visit (INDEPENDENT_AMBULATORY_CARE_PROVIDER_SITE_OTHER): Payer: Managed Care, Other (non HMO) | Admitting: Cardiology

## 2013-12-04 ENCOUNTER — Encounter: Payer: Self-pay | Admitting: Cardiology

## 2013-12-04 VITALS — BP 147/84 | HR 84 | Ht 71.0 in | Wt 222.1 lb

## 2013-12-04 DIAGNOSIS — D696 Thrombocytopenia, unspecified: Secondary | ICD-10-CM

## 2013-12-04 DIAGNOSIS — Z951 Presence of aortocoronary bypass graft: Secondary | ICD-10-CM

## 2013-12-04 DIAGNOSIS — E785 Hyperlipidemia, unspecified: Secondary | ICD-10-CM

## 2013-12-04 DIAGNOSIS — E119 Type 2 diabetes mellitus without complications: Secondary | ICD-10-CM

## 2013-12-04 NOTE — Assessment & Plan Note (Signed)
Followed by endocrinologist

## 2013-12-04 NOTE — Assessment & Plan Note (Signed)
Followed by Dr. Arther Dames at [facility]

## 2013-12-04 NOTE — Assessment & Plan Note (Signed)
Followed by oncolology

## 2013-12-04 NOTE — Progress Notes (Signed)
12/04/2013   PCP: Osa Craver, MD   Chief Complaint  Patient presents with  . Follow-up    pt denies chest pain and sob. pt states that he does have some swelling.    Primary Cardiologist: Dr. Adora Fridge   HPI: 48 year old mild to moderately overweight, married Caucasian male, father of 1 child who last saw Dr. Adora Fridge 05/29/13.  He has a long history of insulin-dependent diabetes as well as extremely high hypertriglyceridemia in the 3000-6000 range with multiple episodes of pancreatitis. Followed by Dr. Arther Dames at [facility].  Now per pt TG running 432.    He had negative venous Dopplers for DVTs. He does wear compression stockings. He is not aware of his family history since he was adopted. He had a CT scan of his chest and abdomen which showed a mass at the tail of his pancreas but also showed severe calcification in the LAD. He did have a Myoview stress test performed 3 years ago that was nonischemic. At that time, he had no symptoms of chest pain or shortness of breath.  In 2014 he had a Myoview stress test performed that showed inferior ischemia which was high risk. He continued to have exertional chest pain with left upper irradiation.  Cardiac catheterization 02/03/13 revealing three-vessel disease and preserved LV function. He subsequently underwent coronary artery bypass bypass grafting x4 by Dr. Tharon Aquas Trigt with a LIMA to his LAD, a left radial to obtuse marginal branch, vein graft to an diagonal branch and PDA. His postop course was uncomplicated.  He completed 7 weeks cardiac rehabilitation and has been back to work.  He does complain of some chest wall pain but otherwise is asymptomatic. He thought this may have increased somewhat in upper sternum.  Recent cellulitis of rt ankle, taking doxycycline from urgent care.  His diabetes is stable with last HgBA1C of 8.5- -running high per directions from Dr. Arther Dames.  Denies SOB.     No Known Allergies  Current  Outpatient Prescriptions  Medication Sig Dispense Refill  . acetaminophen (TYLENOL) 325 MG tablet Take 650 mg by mouth every 6 (six) hours as needed.      Marland Kitchen aspirin EC 325 MG EC tablet Take 1 tablet (325 mg total) by mouth daily.      Marland Kitchen doxycycline (VIBRAMYCIN) 100 MG capsule       . fenofibrate 160 MG tablet Take 160 mg by mouth daily. PM      . insulin NPH (HUMULIN N,NOVOLIN N) 100 UNIT/ML injection Inject 15 Units into the skin at bedtime.       . insulin regular human CONCENTRATED (HUMULIN R) 500 UNIT/ML SOLN injection Inject 15-30 Units into the skin 2 (two) times daily with a meal. 15 units every morning and 30 units at bedtime      . Insulin Syringe-Needle U-100 (INSULIN SYRINGE 1CC/31GX5/16") 31G X 5/16" 1 ML MISC       . meloxicam (MOBIC) 15 MG tablet       . metFORMIN (GLUCOPHAGE) 1000 MG tablet Take 1,000 mg by mouth 2 (two) times daily with a meal.      . metoprolol succinate (TOPROL-XL) 50 MG 24 hr tablet Take 1 tablet (50 mg total) by mouth daily. Take with or immediately following a meal.  30 tablet  6  . Omega-3 Fatty Acids (FISH OIL) 1000 MG CAPS Take 2,000-3,000 mg by mouth daily.      . ONE TOUCH ULTRA  TEST test strip       . ramipril (ALTACE) 2.5 MG capsule Take 2.5 mg by mouth daily.      . simvastatin (ZOCOR) 10 MG tablet TAKE 1 TABLET BY MOUTH EVERY DAY  30 tablet  10   No current facility-administered medications for this visit.    Past Medical History  Diagnosis Date  . Pancreatitis     occasional - last episode 06/2011  . Hyperlipidemia   . Fatty liver disease, nonalcoholic 5/63/8937  . Anemia     occasional - no problems currently(10/02/2011)  . Diabetes mellitus     IDDM  . Bilateral renal cysts 06/16/2011    states no known problems  . Non Hodgkin's lymphoma 1991  . Hypertension     states no dx. of HTN, takes med. to protect kidneys due to DM  . Splenomegaly, congestive, chronic   . Stuffy and runny nose 10/02/2011    yellow drainage from nose  . Loose  body in knee 09/2011    loose bodies left knee  . Lateral meniscus tear 09/2011    left  . Blood transfusion without reported diagnosis   . Coronary artery calcification seen on CAT scan   . Chest pain     positive Myoview stress test  . Abnormal cardiovascular stress test 02/06/2013  . CAD (coronary artery disease), LAD 90%, 1st diag 95%, LCX 70% wth AV groove 90%, RCA 40-50% mid and 80% long distal stenosis 02/03/13 02/04/2013  . S/P CABG x 4, 02/05/13 LIMA-LAD; LT. RADIAL-OM;VG-DIAG; VG-PDA 02/06/2013    Past Surgical History  Procedure Laterality Date  . Nasal septum surgery    . Tonsillectomy    . Elbow surgery    . Hernia repair      inguinal   . Tibia bone biopsy  x 3    left  . Knee arthroscopy  10/09/2011    Procedure: ARTHROSCOPY KNEE;  Surgeon: Nita Sells, MD;  Location: [place];  Service: Orthopedics;  Laterality: Left;  . Coronary artery bypass graft N/A 02/05/2013    Procedure: CORONARY ARTERY BYPASS GRAFTING (CABG);  Surgeon: Ivin Poot, MD;  Location: [place];  Service: Open Heart Surgery;  Laterality: N/A;  Coronary artery bypass graft on pump times four using left internal mammary artery and right greater saphenous vein via endovein harvest and left radial artery harvest.   . Intraoperative transesophageal echocardiogram N/A 02/05/2013    Procedure: INTRAOPERATIVE TRANSESOPHAGEAL ECHOCARDIOGRAM;  Surgeon: Ivin Poot, MD;  Location: [place];  Service: Open Heart Surgery;  Laterality: N/A;  . Radial artery harvest Left 02/05/2013    Procedure: RADIAL ARTERY HARVEST;  Surgeon: Ivin Poot, MD;  Location: [facility] OR;  Service: Vascular;  Laterality: Left;    DSK:AJGOTLX:BW colds or fevers, some weight increase Skin:+ rash redness, swelling of Rt ankle- treated,  no ulcers HEENT:no blurred vision, no congestion CV:see HPI PUL:see HPI GI:no diarrhea constipation or melena, no indigestion GU:no hematuria, no dysuria MS:no joint pain,  no claudication Neuro:no syncope, no lightheadedness Endo:+ diabetes, no thyroid disease  Wt Readings from Last 3 Encounters:  12/04/13 222 lb 1.6 oz (100.744 kg)  08/24/13 214 lb (97.07 kg)  08/24/13 214 lb 6.4 oz (97.251 kg)    PHYSICAL EXAM BP 147/84  Pulse 84  Ht 5\' 11"  (1.803 m)  Wt 222 lb 1.6 oz (100.744 kg)  BMI 30.99 kg/m2 General:Pleasant affect, NAD Skin:Warm and dry, brisk capillary refill HEENT:normocephalic, sclera clear, mucus membranes moist  Neck:supple, no JVD, no bruits  Heart:S1S2 RRR without murmur, gallup, rub or click Lungs:clear without rales, rhonchi, or wheezes IOX:BDZH, non tender, + BS, do not palpate liver spleen or masses Ext:Rt ankle edema, no lower ext edema on Lt., 2+ pedal pulses, 2+ radial pulses Neuro:alert and oriented X 3, MAE, follows commands, + facial symmetry   ASSESSMENT AND PLAN S/P CABG x 4, 02/05/13 LIMA-LAD; LT. RADIAL-OM;VG-DIAG; VG-PDA Stable, residual chest wall pain.    Thrombocytopenia Followed by oncolology   Hyperlipidemia Followed by Dr. Arther Dames at [facility]  DM2 (diabetes mellitus, type 2) Followed by endocrinologist    Follow up with Dr. Gwenlyn Found in 6 months.

## 2013-12-04 NOTE — Patient Instructions (Signed)
Continue same medications   Your physician wants you to follow-up in: 6 months with Dr.Berry. You will receive a reminder letter in the mail two months in advance. If you don't receive a letter, please call our office to schedule the follow-up appointment.  

## 2013-12-04 NOTE — Assessment & Plan Note (Signed)
Stable, residual chest wall pain.

## 2013-12-11 ENCOUNTER — Encounter: Payer: Self-pay | Admitting: Cardiovascular Disease

## 2014-01-27 ENCOUNTER — Other Ambulatory Visit: Payer: Self-pay | Admitting: Cardiovascular Disease

## 2014-01-28 NOTE — Telephone Encounter (Signed)
Rx refill sent to patient pharmacy   

## 2014-02-22 ENCOUNTER — Telehealth: Payer: Self-pay | Admitting: Hematology

## 2014-02-22 ENCOUNTER — Other Ambulatory Visit: Payer: Self-pay | Admitting: *Deleted

## 2014-02-22 DIAGNOSIS — D696 Thrombocytopenia, unspecified: Secondary | ICD-10-CM

## 2014-02-22 DIAGNOSIS — K760 Fatty (change of) liver, not elsewhere classified: Secondary | ICD-10-CM

## 2014-02-22 DIAGNOSIS — D509 Iron deficiency anemia, unspecified: Secondary | ICD-10-CM

## 2014-02-22 DIAGNOSIS — E875 Hyperkalemia: Secondary | ICD-10-CM

## 2014-02-22 NOTE — Telephone Encounter (Signed)
PT HAS 12/8 LAB APPT AND LAB CHECKING TO SEE WHO PT WILL BE TRANSFERRED TO. 08/25/14 APPT MOVED FROM CP1 TO YF. S/W PT HE IS AWARE. ALSO PER PT MOVED 12/8 LAB TO 12/14 - PT HAS NEW D/T.

## 2014-02-23 ENCOUNTER — Other Ambulatory Visit: Payer: Managed Care, Other (non HMO)

## 2014-02-25 ENCOUNTER — Encounter (HOSPITAL_COMMUNITY): Payer: Self-pay | Admitting: Cardiovascular Disease

## 2014-03-01 ENCOUNTER — Other Ambulatory Visit (HOSPITAL_BASED_OUTPATIENT_CLINIC_OR_DEPARTMENT_OTHER): Payer: Managed Care, Other (non HMO)

## 2014-03-01 DIAGNOSIS — E875 Hyperkalemia: Secondary | ICD-10-CM

## 2014-03-01 DIAGNOSIS — D509 Iron deficiency anemia, unspecified: Secondary | ICD-10-CM

## 2014-03-01 DIAGNOSIS — D696 Thrombocytopenia, unspecified: Secondary | ICD-10-CM

## 2014-03-01 DIAGNOSIS — K760 Fatty (change of) liver, not elsewhere classified: Secondary | ICD-10-CM

## 2014-03-01 LAB — CBC WITH DIFFERENTIAL/PLATELET
BASO%: 1.7 % (ref 0.0–2.0)
Basophils Absolute: 0.1 10*3/uL (ref 0.0–0.1)
EOS%: 4.8 % (ref 0.0–7.0)
Eosinophils Absolute: 0.3 10*3/uL (ref 0.0–0.5)
HCT: 36.5 % — ABNORMAL LOW (ref 38.4–49.9)
HGB: 12.8 g/dL — ABNORMAL LOW (ref 13.0–17.1)
LYMPH%: 31.4 % (ref 14.0–49.0)
MCH: 26.9 pg — AB (ref 27.2–33.4)
MCHC: 35.1 g/dL (ref 32.0–36.0)
MCV: 76.8 fL — AB (ref 79.3–98.0)
MONO#: 0.6 10*3/uL (ref 0.1–0.9)
MONO%: 10.1 % (ref 0.0–14.0)
NEUT%: 52 % (ref 39.0–75.0)
NEUTROS ABS: 3 10*3/uL (ref 1.5–6.5)
PLATELETS: 104 10*3/uL — AB (ref 140–400)
RBC: 4.75 10*6/uL (ref 4.20–5.82)
RDW: 14.8 % — AB (ref 11.0–14.6)
WBC: 5.8 10*3/uL (ref 4.0–10.3)
lymph#: 1.8 10*3/uL (ref 0.9–3.3)

## 2014-03-01 LAB — COMPREHENSIVE METABOLIC PANEL
AST: <5 U/L (ref 0–37)
Albumin: 3.7 g/dL (ref 3.5–5.2)
Alkaline Phosphatase: 54 U/L (ref 39–117)
BILIRUBIN TOTAL: 0.3 mg/dL (ref 0.3–1.2)
BUN: 30 mg/dL — ABNORMAL HIGH (ref 6–23)
CO2: 17 mEq/L — ABNORMAL LOW (ref 19–32)
Calcium: 11 mg/dL — ABNORMAL HIGH (ref 8.4–10.5)
Chloride: 96 mEq/L (ref 96–112)
Creatinine, Ser: 0.96 mg/dL (ref 0.50–1.35)
Glucose, Bld: 266 mg/dL — ABNORMAL HIGH (ref 70–99)
Potassium: 4.8 mEq/L (ref 3.5–5.3)
Sodium: 131 mEq/L — ABNORMAL LOW (ref 135–145)
TOTAL PROTEIN: 7.8 g/dL (ref 6.0–8.3)

## 2014-03-01 LAB — LACTATE DEHYDROGENASE: LDH: 126 U/L (ref 94–250)

## 2014-03-01 LAB — FERRITIN: Ferritin: 48 ng/mL (ref 22–322)

## 2014-03-01 LAB — IRON AND TIBC
%SAT: 21 % (ref 20–55)
Iron: 82 ug/dL (ref 42–165)
TIBC: 385 ug/dL (ref 215–435)
UIBC: 303 ug/dL (ref 125–400)

## 2014-03-01 LAB — TECHNOLOGIST REVIEW

## 2014-04-21 ENCOUNTER — Telehealth: Payer: Self-pay | Admitting: Hematology

## 2014-04-21 NOTE — Telephone Encounter (Signed)
Confirm appointment for June. Mailed calendar.

## 2014-08-09 ENCOUNTER — Other Ambulatory Visit: Payer: Self-pay | Admitting: Cardiovascular Disease

## 2014-08-09 NOTE — Telephone Encounter (Signed)
Rx has been sent to the pharmacy electronically. ° °

## 2014-08-23 ENCOUNTER — Other Ambulatory Visit: Payer: Self-pay | Admitting: *Deleted

## 2014-08-23 DIAGNOSIS — D696 Thrombocytopenia, unspecified: Secondary | ICD-10-CM

## 2014-08-23 DIAGNOSIS — D509 Iron deficiency anemia, unspecified: Secondary | ICD-10-CM

## 2014-08-23 DIAGNOSIS — R161 Splenomegaly, not elsewhere classified: Secondary | ICD-10-CM

## 2014-08-23 DIAGNOSIS — K760 Fatty (change of) liver, not elsewhere classified: Secondary | ICD-10-CM

## 2014-08-24 ENCOUNTER — Encounter: Payer: Self-pay | Admitting: Hematology

## 2014-08-24 ENCOUNTER — Other Ambulatory Visit (HOSPITAL_BASED_OUTPATIENT_CLINIC_OR_DEPARTMENT_OTHER): Payer: Managed Care, Other (non HMO)

## 2014-08-24 ENCOUNTER — Telehealth: Payer: Self-pay | Admitting: Hematology

## 2014-08-24 ENCOUNTER — Ambulatory Visit (HOSPITAL_BASED_OUTPATIENT_CLINIC_OR_DEPARTMENT_OTHER): Payer: Managed Care, Other (non HMO) | Admitting: Hematology

## 2014-08-24 DIAGNOSIS — D696 Thrombocytopenia, unspecified: Secondary | ICD-10-CM | POA: Diagnosis not present

## 2014-08-24 DIAGNOSIS — K760 Fatty (change of) liver, not elsewhere classified: Secondary | ICD-10-CM

## 2014-08-24 DIAGNOSIS — R161 Splenomegaly, not elsewhere classified: Secondary | ICD-10-CM

## 2014-08-24 DIAGNOSIS — D509 Iron deficiency anemia, unspecified: Secondary | ICD-10-CM

## 2014-08-24 LAB — CBC WITH DIFFERENTIAL/PLATELET
BASO%: 1.5 % (ref 0.0–2.0)
Basophils Absolute: 0.1 10e3/uL (ref 0.0–0.1)
EOS%: 6.2 % (ref 0.0–7.0)
Eosinophils Absolute: 0.2 10e3/uL (ref 0.0–0.5)
HCT: 37.9 % — ABNORMAL LOW (ref 38.4–49.9)
HGB: 13.4 g/dL (ref 13.0–17.1)
LYMPH%: 33.7 % (ref 14.0–49.0)
MCH: 27.8 pg (ref 27.2–33.4)
MCHC: 35.4 g/dL (ref 32.0–36.0)
MCV: 78.6 fL — ABNORMAL LOW (ref 79.3–98.0)
MONO#: 0.4 10e3/uL (ref 0.1–0.9)
MONO%: 10.5 % (ref 0.0–14.0)
NEUT#: 1.9 10e3/uL (ref 1.5–6.5)
NEUT%: 48.1 % (ref 39.0–75.0)
Platelets: 84 10e3/uL — ABNORMAL LOW (ref 140–400)
RBC: 4.82 10e6/uL (ref 4.20–5.82)
RDW: 15 % — ABNORMAL HIGH (ref 11.0–14.6)
WBC: 3.9 10e3/uL — ABNORMAL LOW (ref 4.0–10.3)
lymph#: 1.3 10e3/uL (ref 0.9–3.3)

## 2014-08-24 LAB — LACTATE DEHYDROGENASE (CC13): LDH: 173 U/L (ref 125–245)

## 2014-08-24 LAB — COMPREHENSIVE METABOLIC PANEL (CC13)
ALT: 130 U/L — ABNORMAL HIGH (ref 0–55)
AST: 85 U/L — ABNORMAL HIGH (ref 5–34)
Albumin: 3.5 g/dL (ref 3.5–5.0)
Alkaline Phosphatase: 63 U/L (ref 40–150)
Anion Gap: 15 meq/L — ABNORMAL HIGH (ref 3–11)
BUN: 11.6 mg/dL (ref 7.0–26.0)
CO2: 17 meq/L — ABNORMAL LOW (ref 22–29)
Calcium: 10.3 mg/dL (ref 8.4–10.4)
Chloride: 103 meq/L (ref 98–109)
Creatinine: 0.9 mg/dL (ref 0.7–1.3)
EGFR: >90 ml/min/1.73 m2
Glucose: 219 mg/dL — ABNORMAL HIGH (ref 70–140)
Potassium: 4.7 meq/L (ref 3.5–5.1)
Sodium: 135 meq/L — ABNORMAL LOW (ref 136–145)
Total Bilirubin: 0.27 mg/dL (ref 0.20–1.20)
Total Protein: 8.3 g/dL (ref 6.4–8.3)

## 2014-08-24 NOTE — Progress Notes (Signed)
[place] OFFICE PROGRESS NOTE  Osa Craver, MD [place] [address]  DIAGNOSIS: Thrombocytopenia - Plan: CBC & Diff and Retic, Comprehensive metabolic panel (Cmet) - [facility], Ferritin, Iron and TIBC [facility], Hepatitis B surface antibody, Hepatitis B surface antigen, Hepatitis B core antibody, total, Hepatitis C antibody, HIV antibody (with reflex)  Splenomegaly - Plan: CBC & Diff and Retic, Comprehensive metabolic panel (Cmet) - [facility], Ferritin, Iron and TIBC [facility], Hepatitis B surface antibody, Hepatitis B surface antigen, Hepatitis B core antibody, total, Hepatitis C antibody, HIV antibody (with reflex), US Abdomen Complete  PROBLEM LIST:  1. Non-Hodgkin lymphoma (diffuse large cell lymphoma) stage IV only involving the left tibia diagnosed on 09/21/1988 at [facility] in [place]. Bone marrow biopsy and liver biopsy in 1990 were negative. The patient underwent 1 cycle of Promace MOPP in 1990 followed by 8 cycles of Promace CytaBOM from 10/1988 - 01/1989. He had radiation therapy to left knee and tibia from 10/1988 - 01/1989. He has remained disease-free since completing therapy in late 1990. NIH patient number was [id number].  2. Left tibia bone biopsy x 3  3. Thrombocytopenia suspected to be on the basis of chronic ITP vs splenmegaly  4. DM type 2  5. Splenomegaly suspected to be secondary to splenic vein thrombosis  possibly related to previous episodes of chronic pancreatitis.  6. Chronic pancreatitis  7. Severe Hyperlipidemia/hypertriglyceridemia  8. Severe hepatic steatosis  9. Hypercalcemia  10. Diastasis recti  11. Recurrent cellulitis of the right lower extremity  12. Possible splenic vein thrombosis found on MRI of the abdomen on 02/07/2012.  13. Bilateral renal cysts found on MRI of the abdomen on 02/07/2012.  14. Pseudocyst/complex cystic lesion on tail of pancreas found on abdominal CT on 02/04/2012.  15. Varices noted within left upper  quadrant on abdominal CT on 02/04/2012.   CURRENT TREATMENT: Observation.   INTERVAL HISTORY: CARDELL [name] 49 y.o. male with a history of thrombocytopenia in conjunction with splenomegaly and a remote history of non-Hodgkin lymphoma treated at the [facility] in 1990 without evidence of recurrence is here for follow up. He was last seen by Dr. Juliann Mule 6 months ago. He is clinically doing very well. He denies any significant pain, no fever, chill, night sweats or weight loss. No significant bruising or bleeding. He takes full dose aspirin.   MEDICAL HISTORY: Past Medical History  Diagnosis Date  . Pancreatitis     occasional - last episode 06/2011  . Hyperlipidemia   . Fatty liver disease, nonalcoholic 09/02/735  . Anemia     occasional - no problems currently(10/02/2011)  . Diabetes mellitus     IDDM  . Bilateral renal cysts 06/16/2011    states no known problems  . Non Hodgkin's lymphoma 1991  . Hypertension     states no dx. of HTN, takes med. to protect kidneys due to DM  . Splenomegaly, congestive, chronic   . Stuffy and runny nose 10/02/2011    yellow drainage from nose  . Loose body in knee 09/2011    loose bodies left knee  . Lateral meniscus tear 09/2011    left  . Blood transfusion without reported diagnosis   . Coronary artery calcification seen on CAT scan   . Chest pain     positive Myoview stress test  . Abnormal cardiovascular stress test 02/06/2013  . CAD (coronary artery disease), LAD 90%, 1st diag 95%, LCX 70% wth AV groove 90%, RCA 40-50% mid and 80%  long distal stenosis 02/03/13 02/04/2013  . S/P CABG x 4, 02/05/13 LIMA-LAD; LT. RADIAL-OM;VG-DIAG; VG-PDA 02/06/2013    INTERIM HISTORY: has Chronic pancreatitis; Hyperlipidemia; Hyperkalemia; Acute kidney injury; Dehydration; DM2 (diabetes mellitus, type 2); Microcytic anemia; Fatty liver disease, nonalcoholic; Bilateral renal cysts; Splenomegaly; Thrombocytopenia; Other malignant lymphomas, unspecified site, extranodal and  solid organ sites; Coronary artery calcification seen on CAT scan; CAD (coronary artery disease), LAD 90%, 1st diag 95%, LCX 70% wth AV groove 90%, RCA 40-50% mid and 80% long distal stenosis 02/03/13; Abnormal cardiovascular stress test; S/P CABG x 4, 02/05/13 LIMA-LAD; LT. RADIAL-OM;VG-DIAG; VG-PDA; Hyperchylomicronemia; Personal history of lymphatic and hematopoietic neoplasm; Personal history of digestive disease; and Chronic nonalcoholic liver disease on his problem list.    ALLERGIES:  has No Known Allergies.  MEDICATIONS: has a current medication list which includes the following prescription(s): acetaminophen, aspirin, fenofibrate, insulin nph human, insulin regular human concentrated, insulin syringe 1cc/31gx5/16", metformin, metoprolol succinate, fish oil, one touch ultra test, ramipril, and simvastatin.  SURGICAL HISTORY:  Past Surgical History  Procedure Laterality Date  . Nasal septum surgery    . Tonsillectomy    . Elbow surgery    . Hernia repair      inguinal   . Tibia bone biopsy  x 3    left  . Knee arthroscopy  10/09/2011    Procedure: ARTHROSCOPY KNEE;  Surgeon: Nita Sells, MD;  Location: [place];  Service: Orthopedics;  Laterality: Left;  . Coronary artery bypass graft N/A 02/05/2013    Procedure: CORONARY ARTERY BYPASS GRAFTING (CABG);  Surgeon: Ivin Poot, MD;  Location: [place];  Service: Open Heart Surgery;  Laterality: N/A;  Coronary artery bypass graft on pump times four using left internal mammary artery and right greater saphenous vein via endovein harvest and left radial artery harvest.   . Intraoperative transesophageal echocardiogram N/A 02/05/2013    Procedure: INTRAOPERATIVE TRANSESOPHAGEAL ECHOCARDIOGRAM;  Surgeon: Ivin Poot, MD;  Location: [place];  Service: Open Heart Surgery;  Laterality: N/A;  . Radial artery harvest Left 02/05/2013    Procedure: RADIAL ARTERY HARVEST;  Surgeon: Ivin Poot, MD;  Location: [place];   Service: Vascular;  Laterality: Left;  . Left heart catheterization with coronary angiogram N/A 02/03/2013    Procedure: LEFT HEART CATHETERIZATION WITH CORONARY ANGIOGRAM;  Surgeon: Lorretta Harp, MD;  Location: [facility] CATH LAB;  Service: Cardiovascular;  Laterality: N/A;    REVIEW OF SYSTEMS:   Constitutional: Denies fevers, chills or abnormal weight loss Eyes: Denies blurriness of vision Ears, nose, mouth, throat, and face: Denies mucositis or sore throat Respiratory: Denies cough, dyspnea or wheezes Cardiovascular: Denies palpitation, chest discomfort or lower extremity swelling Gastrointestinal:  Denies nausea, heartburn or change in bowel habits Skin: Denies abnormal skin rashes Lymphatics: Denies new lymphadenopathy or easy bruising Neurological:Denies numbness, tingling or new weaknesses Behavioral/Psych: Mood is stable, no new changes  All other systems were reviewed with the patient and are negative.  PHYSICAL EXAMINATION: ECOG PERFORMANCE STATUS: 0 - Asymptomatic  Blood pressure 137/85, pulse 86, temperature 98 F (36.7 C), temperature source Oral, resp. rate 18, height $RemoveBe'5\' 11"'LKQlEObFj$  (1.803 m), weight 225 lb 1.6 oz (102.105 kg), SpO2 98 %.  GENERAL:alert, no distress and comfortable; moderately overweight SKIN: skin color, texture, turgor are normal, no rashes or significant lesions; whitish dryness around pinnacle of left ear.  EYES: normal, Conjunctiva are pink and non-injected, sclera clear OROPHARYNX:no exudate, no erythema and lips, buccal mucosa, and tongue normal  NECK:  supple, thyroid normal size, non-tender, without nodularity LYMPH:  no palpable lymphadenopathy in the cervical, axillary or supraclavicular LUNGS: clear to auscultation with normal breathing effort, no wheezes or rhonchi HEART: regular rate & rhythm and no murmurs and no lower extremity edema; chest mid-line scar that is well healed.  ABDOMEN:abdomen soft, non-tender and normal bowel  sounds Musculoskeletal:no cyanosis of digits and no clubbing; R toe swollen with erythrema around toe nail bed.  NEURO: alert & oriented x 3 with fluent speech, no focal motor/sensory deficits  Labs:  CBC Latest Ref Rng 08/24/2014 03/01/2014 08/24/2013  WBC 4.0 - 10.3 10e3/uL 3.9(L) 5.8 6.4  Hemoglobin 13.0 - 17.1 g/dL 13.4 12.8(L) 14.4  Hematocrit 38.4 - 49.9 % 37.9(L) 36.5(L) 41.8  Platelets 140 - 400 10e3/uL 84(L) 104(L) 130(L)    CMP Latest Ref Rng 08/24/2014 03/01/2014 08/24/2013  Glucose 70 - 140 mg/dl 219(H) 266(H) 312(H)  BUN 7.0 - 26.0 mg/dL 11.6 30(H) 16  Creatinine 0.7 - 1.3 mg/dL 0.9 0.96 1.01  Sodium 136 - 145 mEq/L 135(L) 131(L) 137  Potassium 3.5 - 5.1 mEq/L 4.7 4.8 5.0  Chloride 96 - 112 mEq/L - 96 98  CO2 22 - 29 mEq/L 17(L) 17(L) 23  Calcium 8.4 - 10.4 mg/dL 10.3 11.0(H) 11.1(H)  Total Protein 6.4 - 8.3 g/dL 8.3 7.8 8.5(H)  Total Bilirubin 0.20 - 1.20 mg/dL 0.27 0.3 0.2(L)  Alkaline Phos 40 - 150 U/L 63 54 65  AST 5 - 34 U/L 85(H) <5 71(H)  ALT 0 - 55 U/L 130(H) <5 86(H)     RADIOGRAPHIC STUDIES: 1. US abdomen complete on 06/15/2011 showed no gallstones are noted within gallbladder. Normal CBD. Again noted fatty infiltration of the liver. Significant splenomegaly. Bilateral renal cyst. No hydronephrosis or diagnostic renal calculus. Splenic volume was 1418.7 mL. The spleen measured 17.5 cm in length.  2. Portable chest x-ray on 06/16/2011 showed low volumes with new atelectasis or infiltrate at the left lung base.  3. CT of the chest with contrast on 02/04/2012 showed no active infiltrate or effusion. Minimal peribronchovascular nodularity in the periphery of the right upper lobe may be residual scarring from prior infection, but atypical infection cannot be excluded. Age advanced coronary artery calcifications in the distribution of the left anterior descending coronary artery.  4. CT of the abdomen with contrast on 02/04/2012 showed severe fatty infiltration of the liver  with areas of sparing. No ductal dilatation.  Moderate splenomegaly with varices in the left upper quadrant. Low attenuation lesion 2.3 x 3.5 x 2.3 cm in the tail of the pancreas may represent pseudocyst from prior pancreatitis. A cystic pancreatic neoplasm cannot be excluded with this exam. Correlation with patient history is recommended as well as follow-up CT or preferably MRI to assess further.  5. MRI of the abdomen with and without contrast on 02/07/2012 showed a 2.9 cm complex cystic lesion with layering debris along the pancreatic tail, most suggestive of a pancreatic pseudocyst. Splenomegaly was present with the spleen measuring 20.1 cm in maximum dimension. There was suspected splenic vein thrombosis with associated perisplenic collaterals. The presence of well-developed collaterals suggests that this is not acute. Severe hepatic steatosis. 3.1 x 3.7 cm right renal cyst (series 8/image 39). Additional small bilateral renal cysts measuring up to 12 mm. No hydronephrosis.    ASSESSMENT: CAEDIN MOGAN 49 y.o. male with a history of Thrombocytopenia - Plan: CBC & Diff and Retic, Comprehensive metabolic panel (Cmet) - [facility], Ferritin, Iron and TIBC [facility], Hepatitis B surface  antibody, Hepatitis B surface antigen, Hepatitis B core antibody, total, Hepatitis C antibody, HIV antibody (with reflex)  Splenomegaly - Plan: CBC & Diff and Retic, Comprehensive metabolic panel (Cmet) - [facility], Ferritin, Iron and TIBC [facility], Hepatitis B surface antibody, Hepatitis B surface antigen, Hepatitis B core antibody, total, Hepatitis C antibody, HIV antibody (with reflex), US Abdomen Complete   PLAN:   1. Chronic ITP vs thrombocytopenia secondary to splenomegaly.  --Clinically, the patient is doing well. His plt count is 84 today.  I reviewed his previous CBC, his platelet count has been in the range of 75-130, with fluctuation. -We'll continue observation. We discussed the risk of bleeding, avoid other NSAIDs, avoid  contact sports and injury.   2. History of NHL (1990) --He has no symptoms to suggest disease recurrence.  The patient states that he had a bone marrow at the time of the diagnosis of his lymphoma in 1990. The bone marrow at that time was negative.  -His clinically doing well, no B symptoms, no adenopathy on exam, no evidence of recurrence.  3. Splenomegaly, severe hepatic steatosis -He has intermittent elevated liver enzymes, clinically no symptoms. Her last ultrasound was 2- 3 years ago. -We discussed severe hepatic state ptosis can cause liver cirrhosis, we'll monitor with ultrasound yearly. -We'll check hepatitis B and C and HIV on next visit -I'll obtain a ultrasound of liver and spleen in the next few weeks.  3. Follow up.  -He is also seeing other specialties and have lab test, so I'll see him back in one year with CBC. He knows to call me if his CBC has significant change before next appointment. -I'll call him after his ultrasound test.  All questions were answered. The patient knows to call the clinic with any problems, questions or concerns. We can certainly see the patient much sooner if necessary.  I spent 20 minutes counseling the patient face to face. The total time spent in the appointment was 25 minutes.    Truitt Merle, MD 08/24/2014 8:59 AM

## 2014-08-24 NOTE — Telephone Encounter (Signed)
Pt confirmed labs/ov per 06/07 POF, gave pt AVS and Calendar.... KJ °

## 2014-08-25 ENCOUNTER — Ambulatory Visit: Payer: Managed Care, Other (non HMO) | Admitting: Hematology

## 2014-08-25 ENCOUNTER — Other Ambulatory Visit: Payer: Managed Care, Other (non HMO)

## 2014-08-31 ENCOUNTER — Ambulatory Visit (HOSPITAL_COMMUNITY)
Admission: RE | Admit: 2014-08-31 | Discharge: 2014-08-31 | Disposition: A | Discharge status: Home / Self Care | Payer: Managed Care, Other (non HMO) | Source: Ambulatory Visit | Attending: Hematology | Admitting: Hematology

## 2014-08-31 DIAGNOSIS — N281 Cyst of kidney, acquired: Secondary | ICD-10-CM | POA: Diagnosis not present

## 2014-08-31 DIAGNOSIS — R161 Splenomegaly, not elsewhere classified: Secondary | ICD-10-CM | POA: Insufficient documentation

## 2014-09-01 ENCOUNTER — Other Ambulatory Visit: Payer: Self-pay | Admitting: Cardiovascular Disease

## 2014-09-01 NOTE — Telephone Encounter (Signed)
REFILL 

## 2014-09-02 MED ORDER — SIMVASTATIN 10 MG PO TABS: 10.0000 mg | ORAL_TABLET | Freq: Every day | ORAL | Status: DC

## 2014-09-02 NOTE — Telephone Encounter (Signed)
Rx(s) sent to pharmacy electronically.  

## 2014-09-02 NOTE — Addendum Note (Signed)
Addended by: Diana Eves on: 09/02/2014 05:11 PM   Modules accepted: Orders

## 2014-09-03 ENCOUNTER — Other Ambulatory Visit: Payer: Self-pay | Admitting: *Deleted

## 2014-09-03 MED ORDER — SIMVASTATIN 10 MG PO TABS: 10.0000 mg | ORAL_TABLET | Freq: Every day | ORAL | Status: DC

## 2014-09-07 ENCOUNTER — Ambulatory Visit (HOSPITAL_COMMUNITY): Payer: Managed Care, Other (non HMO)

## 2014-09-14 ENCOUNTER — Ambulatory Visit: Payer: Managed Care, Other (non HMO) | Admitting: Cardiovascular Disease

## 2014-09-17 ENCOUNTER — Ambulatory Visit: Payer: Managed Care, Other (non HMO) | Admitting: Cardiovascular Disease

## 2014-10-05 ENCOUNTER — Encounter: Payer: Self-pay | Admitting: Cardiovascular Disease

## 2014-10-05 ENCOUNTER — Ambulatory Visit (INDEPENDENT_AMBULATORY_CARE_PROVIDER_SITE_OTHER): Payer: Managed Care, Other (non HMO) | Admitting: Cardiovascular Disease

## 2014-10-05 VITALS — BP 120/78 | HR 99 | Ht 71.0 in | Wt 222.5 lb

## 2014-10-05 DIAGNOSIS — I2510 Atherosclerotic heart disease of native coronary artery without angina pectoris: Secondary | ICD-10-CM

## 2014-10-05 DIAGNOSIS — Z951 Presence of aortocoronary bypass graft: Secondary | ICD-10-CM

## 2014-10-05 DIAGNOSIS — I2583 Coronary atherosclerosis due to lipid rich plaque: Secondary | ICD-10-CM

## 2014-10-05 DIAGNOSIS — E785 Hyperlipidemia, unspecified: Secondary | ICD-10-CM

## 2014-10-05 NOTE — Patient Instructions (Signed)
Your physician wants you to follow-up in: 1 year with Dr Berry. You will receive a reminder letter in the mail two months in advance. If you don't receive a letter, please call our office to schedule the follow-up appointment.  

## 2014-10-05 NOTE — Progress Notes (Signed)
10/05/2014 [name]   09/19/1965  [id number]  Primary Physician [name] Primary Cardiologist: [facility] Harp [name]   HPI:  The patient returns today for followup. He is a 49 year old mild to moderately overweight, married Caucasian male, father of 1 child who I last saw 05/29/13. He has seen Dr. Sallyanne [name] twice in 2011. He has a long history of insulin-dependent diabetes as well as extremely high hypertriglyceridemia in the 3000-6000 range with multiple episodes of pancreatitis. He had negative venous Dopplers for DVTs. He does wear compression stockings. He has complained of left inframammary chest pain and increasing shortness of breath. He is not aware of his family history since he was adopted. He had a CT scan of his chest and abdomen which showed a mass at the tail of his pancreas but also showed severe calcification in the LAD. He did have a Myoview stress test performed 2 years ago that was nonischemic. At that time, he had no symptoms of chest pain or shortness of breath. I referred him to Dr. Lavetta [name] at [name] for more intense treatment of his severe hypertriglyceridemia.since I saw him last in December of last several weeks he developed exertional chest pain and shortness of breath.I was concerned that he has developed progression of disease given all of his risk factors.  He had a Myoview stress test performed that showed inferior ischemia which is high risk. He continued to have exertional chest pain with left upper irradiation.I performed cardiac catheterization on him 02/03/13 revealing three-vessel disease and preserved LV function. He subsequently underwent coronary artery bypass bypass grafting x4 by Dr. Tharon Aquas [name] with a LIMA to his LAD, a left radial to obtuse marginal branch, vein graft to an diagonal branch and PDA. His postop course was uncomplicated. He completed 7 weeks her cardiac rehabilitation and is  back to work for one month. He does complain of some chest wall pain but otherwise is asymptomatic. Since I saw him a year ago he remained clinically stable. He denies chest pain or shortness of breath. His major issue is with massive insulin resistance and hypertriglyceridemia.    Current Outpatient Prescriptions  Medication Sig Dispense Refill  . aspirin EC 325 MG EC tablet Take 1 tablet (325 mg total) by mouth daily.    . fenofibrate 160 MG tablet Take 160 mg by mouth daily. PM    . insulin NPH (HUMULIN N,NOVOLIN N) 100 UNIT/ML injection Inject 30 Units into the skin at bedtime. 50 units in pm    . insulin regular human CONCENTRATED (HUMULIN R) 500 UNIT/ML SOLN injection Inject 40 Units into the skin 2 (two) times daily with a meal. 40 units every morning, .30 ml at lunch, and .50 ml with dinner sliding scale to adjust    . Insulin Syringe-Needle U-100 (INSULIN SYRINGE 1CC/31GX5/16") 31G X 5/16" 1 ML MISC     . metFORMIN (GLUCOPHAGE) 1000 MG tablet Take 1,000 mg by mouth 2 (two) times daily with a meal.    . metoprolol succinate (TOPROL-XL) 50 MG 24 hr tablet TAKE 1 TABLET BY MOUTH EVERY DAY WITH OR IMMEDIATELY FOLLOWING A MEAL 30 tablet 2  . Omega-3 Fatty Acids (FISH OIL) 1000 MG CAPS Take 2,000-3,000 mg by mouth daily.    . ONE TOUCH ULTRA TEST test strip     . ramipril (ALTACE) 2.5 MG capsule Take 2.5 mg by mouth daily.    . simvastatin (ZOCOR) 10 MG tablet Take  1 tablet (10 mg total) by mouth daily. NEEDS APPOINTMENT FOR FUTURE REFILLS 30 tablet 0  . urea (CARMOL) 40 % CREA Apply 1 application topically 2 (two) times daily.  5   No current facility-administered medications for this visit.    No Known Allergies  History   Social History  . Marital Status: Married    Spouse Name: [name]  . Number of Children: 1  . 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 Smoking status: Never Smoker   . Smokeless  tobacco: Never Used  . Alcohol Use: No  . Drug Use: No  . Sexual Activity: Yes   Other Topics Concern  . Not on file   Social History Narrative   Adopted, so no pertinent FH.  Married.  Lives with wife in [place].  Independent of ADLs and ambulation.     Review of Systems: General: negative for chills, fever, night sweats or weight changes.  Cardiovascular: negative for chest pain, dyspnea on exertion, edema, orthopnea, palpitations, paroxysmal nocturnal dyspnea or shortness of breath Dermatological: negative for rash Respiratory: negative for cough or wheezing Urologic: negative for hematuria Abdominal: negative for nausea, vomiting, diarrhea, bright red blood per rectum, melena, or hematemesis Neurologic: negative for visual changes, syncope, or dizziness All other systems reviewed and are otherwise negative except as noted above.    Blood pressure 120/78, pulse 99, height 5\' 11"  (1.803 m), weight 222 lb 8 oz (100.925 kg).  General appearance: alert and no distress Neck: no adenopathy, no carotid bruit, no JVD, supple, symmetrical, trachea midline and thyroid not enlarged, symmetric, no tenderness/mass/nodules Lungs: clear to auscultation bilaterally Heart: regular rate and rhythm, S1, S2 normal, no murmur, click, rub or gallop Extremities: extremities normal, atraumatic, no cyanosis or edema  EKG normal sinus rhythm at 99 with nonspecific ST and T-wave changes. I personally reviewed this EKG  ASSESSMENT AND PLAN:   S/P CABG x 4, 02/05/13 LIMA-LAD; LT. RADIAL-OM;VG-DIAG; VG-PDA History of coronary artery disease status post bypass grafting by [name] 02/03/13 with a LIMA to his LAD, left radial to obtuse marginal branch, vein graft to diagonal branch and PDA. His postop course was uncompensated. He did complete 7 weeks of chronic rehabilitation and return to work. He is relatively asymptomatic.  Hyperlipidemia History of hyperlipidemia and familial  hypertriglyceridemia on fenofibrate and simvastatin followed by Dr. Arther Dames at [name].      [name], [name] 10/05/2014 9:09 AM

## 2014-10-05 NOTE — Assessment & Plan Note (Signed)
History of coronary artery disease status post bypass grafting by Dr. Prescott Gum 02/03/13 with a LIMA to his LAD, left radial to obtuse marginal branch, vein graft to diagonal branch and PDA. His postop course was uncompensated. He did complete 7 weeks of chronic rehabilitation and return to work. He is relatively asymptomatic.

## 2014-10-05 NOTE — Assessment & Plan Note (Signed)
History of hyperlipidemia and familial hypertriglyceridemia on fenofibrate and simvastatin followed by Dr. Arther Dames at [facility].

## 2014-10-06 ENCOUNTER — Other Ambulatory Visit: Payer: Self-pay | Admitting: Cardiovascular Disease

## 2014-10-06 ENCOUNTER — Telehealth: Payer: Self-pay | Admitting: Cardiovascular Disease

## 2014-10-06 MED ORDER — SIMVASTATIN 10 MG PO TABS: ORAL_TABLET | ORAL | Status: DC

## 2014-10-06 NOTE — Telephone Encounter (Signed)
°  1. Which medications need to be refilled? Simvastatin  2. Which pharmacy is medication to be sent to?[phone]  3. Do they need a 30 day or 90 day supply? 90 and refills  4. Would they like a call back once the medication has been sent to the pharmacy? no

## 2014-10-06 NOTE — Telephone Encounter (Signed)
Rx(s) sent to pharmacy electronically.  

## 2014-11-10 ENCOUNTER — Other Ambulatory Visit: Payer: Self-pay | Admitting: Cardiovascular Disease

## 2014-11-10 NOTE — Telephone Encounter (Signed)
Rx request sent to pharmacy.  

## 2014-11-11 ENCOUNTER — Other Ambulatory Visit: Payer: Self-pay | Admitting: *Deleted

## 2015-01-01 IMAGING — CR DG CHEST 1V PORT
1 series · 1 of 1 positions shown · non-contrast
Comparison: 02/02/2013.

CLINICAL DATA: CABG.

EXAM:
PORTABLE CHEST - 1 VIEW

[AP]
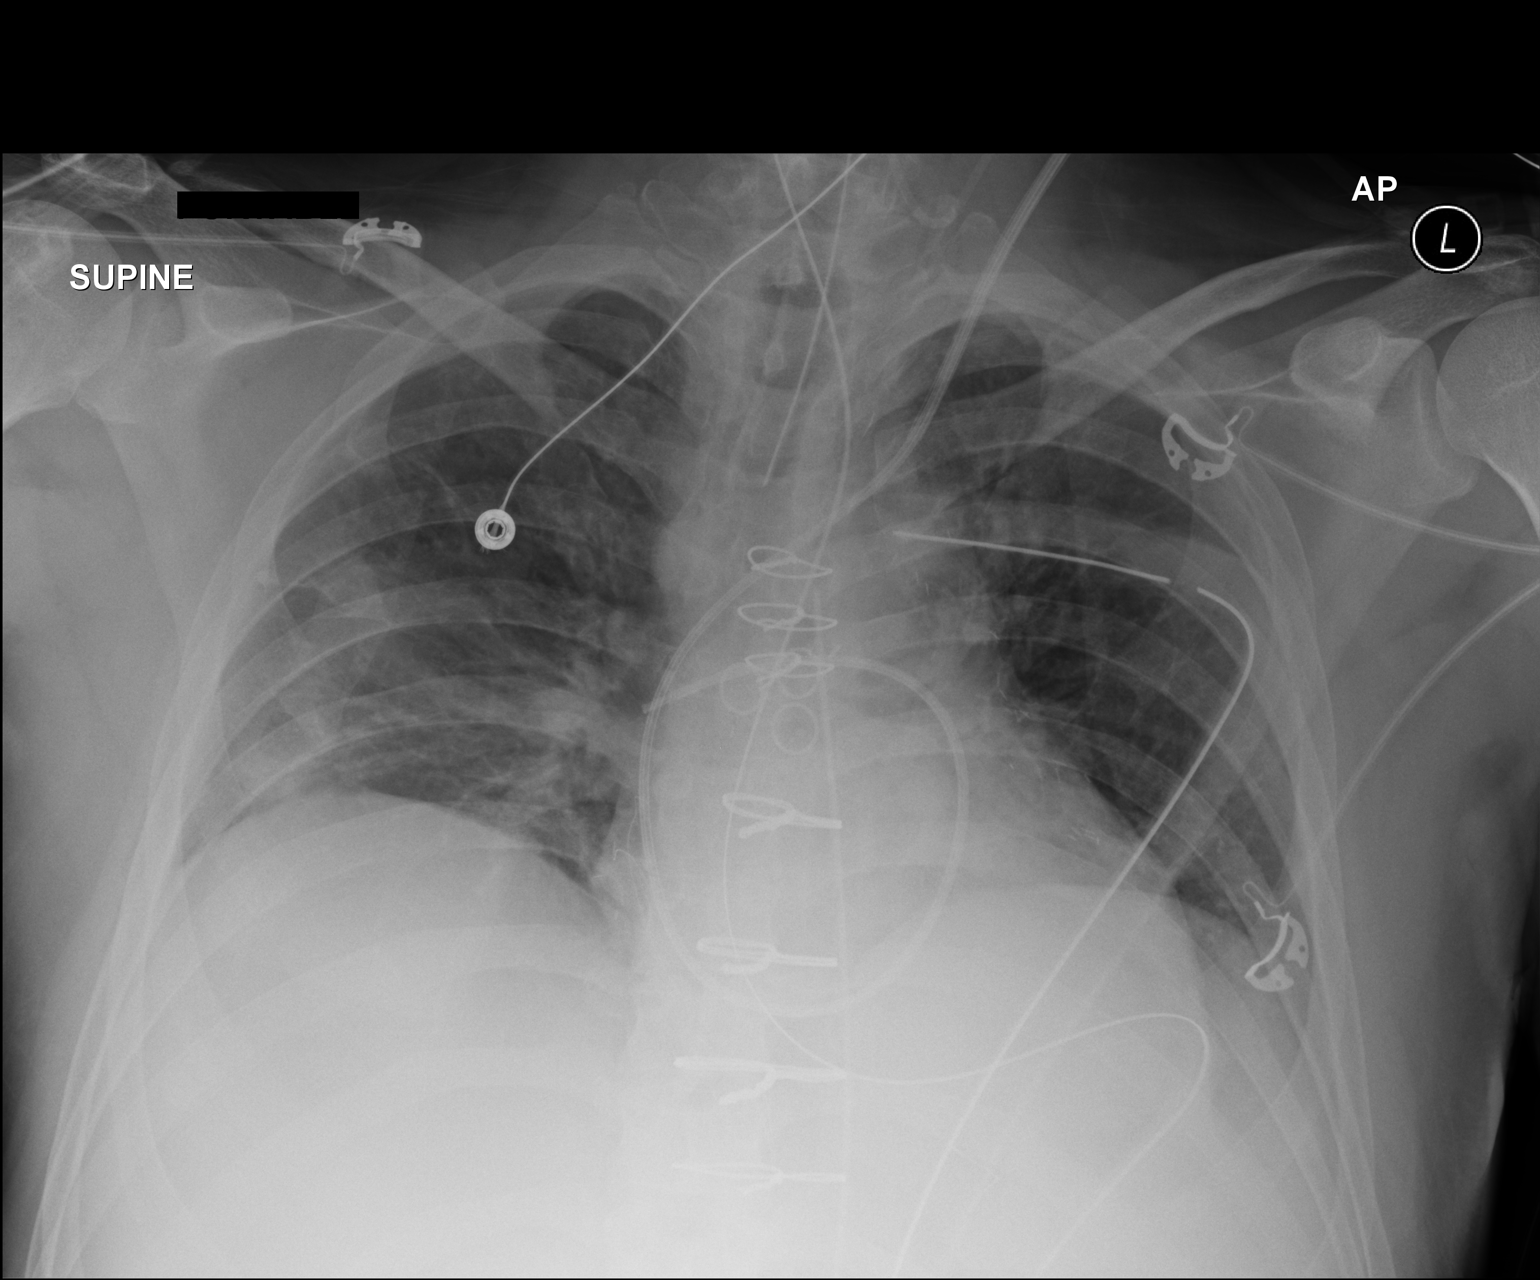

[1 of 1 positions shown; findings below may reference images not displayed]

FINDINGS: Endotracheal tube noted projected 3 cm above the carina. NG tube
noted coiled in stomach. Swan-Ganz catheter noted with tip projected
over right pulmonary artery. Mediastinal drainage catheter noted.
Left chest tube noted. No pneumothorax. CABG. No pulmonary venous
congestion. Mild atelectasis lung bases. Density is noted projected
over the right mid lung field. This could represent a region of
atelectasis or could represent surgical padding, clinical
correlation suggested and follow-up chest x-ray can be obtained.
IMPRESSION: 1. Prior CABG.  Good positioning of lines and tubes.

2. Density noted projected over the right mid chest, although this
could represent atelectasis, surgical padding could present in this
fashion, clinical correlation suggested and follow-up chest x-ray
can be obtained if needed. These results were called by telephone at
the time of interpretation on 02/05/2013 at [DATE] to the patient's
ICU nurse, who verbally acknowledged these results.

## 2015-01-12 ENCOUNTER — Other Ambulatory Visit: Payer: Self-pay

## 2015-01-12 MED ORDER — METOPROLOL SUCCINATE ER 50 MG PO TB24: ORAL_TABLET | ORAL | Status: DC

## 2015-01-13 ENCOUNTER — Telehealth: Payer: Self-pay | Admitting: Cardiovascular Disease

## 2015-01-13 MED ORDER — SIMVASTATIN 10 MG PO TABS: ORAL_TABLET | ORAL | Status: DC

## 2015-01-13 MED ORDER — METOPROLOL SUCCINATE ER 50 MG PO TB24: ORAL_TABLET | ORAL | Status: DC

## 2015-01-13 NOTE — Telephone Encounter (Signed)
°  STAT if patient is at the pharmacy , call can be transferred to refill team.   1. Which medications need to be refilled? Simvastatin and Metoprolol-new prescription,changing pharmacy  2. Which pharmacy/location is medication to be sent to?[phone]  3. Do they need a 30 day or 90 day supply? 90 and refills

## 2015-01-13 NOTE — Telephone Encounter (Signed)
Returned call to patient's wife.90 day refills for metoprolol and simvastatin sent to pharmacy.

## 2015-05-31 ENCOUNTER — Other Ambulatory Visit: Payer: Self-pay | Admitting: Family

## 2015-05-31 DIAGNOSIS — R600 Localized edema: Secondary | ICD-10-CM

## 2015-06-06 ENCOUNTER — Ambulatory Visit
Admission: RE | Admit: 2015-06-06 | Discharge: 2015-06-06 | Disposition: A | Discharge status: Home / Self Care | Payer: Managed Care, Other (non HMO) | Source: Ambulatory Visit | Attending: Family | Admitting: Family

## 2015-06-06 DIAGNOSIS — R600 Localized edema: Secondary | ICD-10-CM

## 2015-06-30 ENCOUNTER — Encounter: Payer: Self-pay | Admitting: Cardiovascular Disease

## 2015-07-21 ENCOUNTER — Encounter: Payer: Self-pay | Admitting: Cardiovascular Disease

## 2015-07-21 ENCOUNTER — Ambulatory Visit (INDEPENDENT_AMBULATORY_CARE_PROVIDER_SITE_OTHER): Payer: Managed Care, Other (non HMO) | Admitting: Cardiovascular Disease

## 2015-07-21 VITALS — BP 116/73 | HR 82 | Ht 71.0 in | Wt 213.4 lb

## 2015-07-21 DIAGNOSIS — I2510 Atherosclerotic heart disease of native coronary artery without angina pectoris: Secondary | ICD-10-CM

## 2015-07-21 DIAGNOSIS — I2583 Coronary atherosclerosis due to lipid rich plaque: Principal | ICD-10-CM

## 2015-07-21 DIAGNOSIS — Z951 Presence of aortocoronary bypass graft: Secondary | ICD-10-CM

## 2015-07-21 NOTE — Assessment & Plan Note (Signed)
History of hyperlipidemia/hypertriglyceridemia on fenofibrate and simvastatin followed by Dr. Arther Dames at [facility]

## 2015-07-21 NOTE — Patient Instructions (Signed)
Medication Instructions:  Your physician recommends that you continue on your current medications as directed. Please refer to the Current Medication list given to you today.   Labwork: none  Testing/Procedures: none  Follow-Up: Your physician recommends that you schedule a follow-up appointment in: 3 months with Dr. Berry.    Any Other Special Instructions Will Be Listed Below (If Applicable).     If you need a refill on your cardiac medications before your next appointment, please call your pharmacy.   

## 2015-07-21 NOTE — Progress Notes (Signed)
07/21/2015 [name]   1965-06-30  [id number]  Primary Physician Osa Craver, MD Primary Cardiologist: Lorretta Harp MD [name]   HPI:  The patient returns today for followup. He is a 50 year old mild to moderately overweight, married Caucasian male, father of 1 child who I last saw 10/05/14. He has seen Dr. Sallyanne Kuster twice in 2011. He has a long history of insulin-dependent diabetes as well as extremely high hypertriglyceridemia in the 3000-6000 range with multiple episodes of pancreatitis. He had negative venous Dopplers for DVTs. He does wear compression stockings. He has complained of left inframammary chest pain and increasing shortness of breath. He is not aware of his family history since he was adopted. He had a CT scan of his chest and abdomen which showed a mass at the tail of his pancreas but also showed severe calcification in the LAD. He did have a Myoview stress test performed 2 years ago that was nonischemic. At that time, he had no symptoms of chest pain or shortness of breath. I referred him to Dr. Lavetta Nielsen at [facility] for more intense treatment of his severe hypertriglyceridemia.since I saw him last in December of last several weeks he developed exertional chest pain and shortness of breath.I was concerned that he has developed progression of disease given all of his risk factors.  He had a Myoview stress test performed that showed inferior ischemia which is high risk. He continued to have exertional chest pain with left upper irradiation.I performed cardiac catheterization on him 02/03/13 revealing three-vessel disease and preserved LV function. He subsequently underwent coronary artery bypass bypass grafting x4 by Dr. Tharon Aquas Trigt with a LIMA to his LAD, a left radial to obtuse marginal branch, vein graft to an diagonal branch and PDA. His postop course was uncomplicated. He completed 7 weeks her cardiac rehabilitation and is back  to work for one month. He does complain of some chest wall pain but otherwise is asymptomatic. Since I saw him in the office a year ago he's been well until yesterday when he developed dyspnea on exertion when walking between buildings at his place of work Did have some subtle pain in his left upper extremity but denied chest pain..   Current Outpatient Prescriptions  Medication Sig Dispense Refill  . aspirin EC 325 MG EC tablet Take 1 tablet (325 mg total) by mouth daily.    . fenofibrate 160 MG tablet Take 160 mg by mouth daily. PM    . Insulin Human (INSULIN PUMP) SOLN Inject into the skin.    Marland Kitchen insulin regular human CONCENTRATED (HUMULIN R) 500 UNIT/ML SOLN injection Inject into the skin.     . metFORMIN (GLUCOPHAGE) 1000 MG tablet Take 1,000 mg by mouth 2 (two) times daily with a meal.    . metoprolol succinate (TOPROL-XL) 50 MG 24 hr tablet TAKE 1 TABLET BY MOUTH EVERY DAY WITH OR IMMEDIATELY FOLLOWING A MEAL 90 tablet 3  . Omega-3 Fatty Acids (FISH OIL) 1000 MG CAPS Take 2,000-3,000 mg by mouth daily.    . ONE TOUCH ULTRA TEST test strip     . ramipril (ALTACE) 5 MG capsule Take 5 mg by mouth daily.  4  . simvastatin (ZOCOR) 10 MG tablet TAKE 1 TABLET(10 MG) BY MOUTH DAILY 90 tablet 3  . urea (CARMOL) 40 % CREA Apply 1 application topically 2 (two) times daily.  5   No current facility-administered medications for this visit.    No  Known Allergies  Social History   Social History  . Marital Status: Married    Spouse Name: [name]  . Number of Children: 1  . 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 Smoking status: Never Smoker   . Smokeless tobacco: Never Used  . Alcohol Use: No  . Drug Use: No  . Sexual Activity: Yes   Other Topics Concern  . Not on file   Social History Narrative   Adopted, so no pertinent FH.  Married.  Lives with wife in [place].  Independent of ADLs and ambulation.      Review of Systems: General: negative for chills, fever, night sweats or weight changes.  Cardiovascular: negative for chest pain, dyspnea on exertion, edema, orthopnea, palpitations, paroxysmal nocturnal dyspnea or shortness of breath Dermatological: negative for rash Respiratory: negative for cough or wheezing Urologic: negative for hematuria Abdominal: negative for nausea, vomiting, diarrhea, bright red blood per rectum, melena, or hematemesis Neurologic: negative for visual changes, syncope, or dizziness All other systems reviewed and are otherwise negative except as noted above.    Blood pressure 116/73, pulse 82, height 5\' 11"  (1.803 m), weight 213 lb 6 oz (96.786 kg).  General appearance: alert and no distress Neck: no adenopathy, no carotid bruit, no JVD, supple, symmetrical, trachea midline and thyroid not enlarged, symmetric, no tenderness/mass/nodules Lungs: clear to auscultation bilaterally Heart: regular rate and rhythm, S1, S2 normal, no murmur, click, rub or gallop Extremities: extremities normal, atraumatic, no cyanosis or edema  EKG normal sinus rhythm at 75 with T-wave inversion in leads 1 and L. I personally reviewed this EKG  ASSESSMENT AND PLAN:   Hyperlipidemia History of hyperlipidemia/hypertriglyceridemia on fenofibrate and simvastatin followed by Dr. Arther Dames at [facility]  S/P CABG x 4, 02/05/13 LIMA-LAD; LT. RADIAL-OM;VG-DIAG; VG-PDA History of CAD status post coronary artery bypass grafting X 4 by Dr. Tharon Aquas Trigt November 2014 with a LIMA to his LAD, left radial to an obtuse marginal branch, vein graft to diagonal branch and PDA. His postoperative course was uncomplicated. He has been well since I saw him a year ago up until yesterday when he had an episode of dyspnea on exertion with mild left upper extremity discomfort which was new for him. He denied chest pain.at this point, after one episode, I do not feel a stress test is  warranted. I will see him back in 3 months for follow-up.      Lorretta Harp MD FACP,FACC,FAHA, [facility] 07/21/2015 4:01 PM

## 2015-07-21 NOTE — Assessment & Plan Note (Signed)
History of CAD status post coronary artery bypass grafting X 4 by Dr. Tharon Aquas Trigt November 2014 with a LIMA to his LAD, left radial to an obtuse marginal branch, vein graft to diagonal branch and PDA. His postoperative course was uncomplicated. He has been well since I saw him a year ago up until yesterday when he had an episode of dyspnea on exertion with mild left upper extremity discomfort which was new for him. He denied chest pain.at this point, after one episode, I do not feel a stress test is warranted. I will see him back in 3 months for follow-up.

## 2015-08-24 DIAGNOSIS — E881 Lipodystrophy, not elsewhere classified: Secondary | ICD-10-CM | POA: Insufficient documentation

## 2015-08-25 ENCOUNTER — Telehealth: Payer: Self-pay | Admitting: Hematology

## 2015-08-25 NOTE — Telephone Encounter (Signed)
S.w. Pt and r/s appt per pt...the patient ok and aware of new d.t

## 2015-08-26 ENCOUNTER — Ambulatory Visit: Payer: Managed Care, Other (non HMO) | Admitting: Hematology

## 2015-08-26 ENCOUNTER — Other Ambulatory Visit: Payer: Managed Care, Other (non HMO)

## 2015-09-26 ENCOUNTER — Encounter: Payer: Self-pay | Admitting: Hematology

## 2015-09-26 ENCOUNTER — Other Ambulatory Visit (HOSPITAL_BASED_OUTPATIENT_CLINIC_OR_DEPARTMENT_OTHER): Payer: Managed Care, Other (non HMO)

## 2015-09-26 ENCOUNTER — Ambulatory Visit (HOSPITAL_BASED_OUTPATIENT_CLINIC_OR_DEPARTMENT_OTHER): Payer: Managed Care, Other (non HMO) | Admitting: Hematology

## 2015-09-26 VITALS — BP 145/79 | HR 87 | Temp 98.2°F | Resp 18 | Ht 71.0 in | Wt 231.8 lb

## 2015-09-26 DIAGNOSIS — D696 Thrombocytopenia, unspecified: Secondary | ICD-10-CM

## 2015-09-26 DIAGNOSIS — K760 Fatty (change of) liver, not elsewhere classified: Secondary | ICD-10-CM

## 2015-09-26 DIAGNOSIS — R161 Splenomegaly, not elsewhere classified: Secondary | ICD-10-CM

## 2015-09-26 DIAGNOSIS — Z8572 Personal history of non-Hodgkin lymphomas: Secondary | ICD-10-CM

## 2015-09-26 DIAGNOSIS — E119 Type 2 diabetes mellitus without complications: Secondary | ICD-10-CM

## 2015-09-26 LAB — CBC & DIFF AND RETIC
BASO%: 2.6 % — ABNORMAL HIGH (ref 0.0–2.0)
Basophils Absolute: 0.2 10*3/uL — ABNORMAL HIGH (ref 0.0–0.1)
EOS%: 8.2 % — AB (ref 0.0–7.0)
Eosinophils Absolute: 0.6 10*3/uL — ABNORMAL HIGH (ref 0.0–0.5)
HCT: 38.4 % (ref 38.4–49.9)
IMMATURE RETIC FRACT: 22.6 % — AB (ref 3.00–10.60)
LYMPH#: 1.9 10*3/uL (ref 0.9–3.3)
LYMPH%: 24.9 % (ref 14.0–49.0)
MCH: 27.3 pg (ref 27.2–33.4)
MCHC: 35.1 g/dL (ref 32.0–36.0)
MCV: 77.7 fL — ABNORMAL LOW (ref 79.3–98.0)
MONO#: 0.5 10*3/uL (ref 0.1–0.9)
MONO%: 6.7 % (ref 0.0–14.0)
NEUT#: 4.3 10*3/uL (ref 1.5–6.5)
NEUT%: 57.6 % (ref 39.0–75.0)
NRBC: 0 % (ref 0–0)
Platelets: 149 10*3/uL (ref 140–400)
RBC: 4.94 10*6/uL (ref 4.20–5.82)
RDW: 17 % — ABNORMAL HIGH (ref 11.0–14.6)
RETIC %: 3.75 % — AB (ref 0.80–1.80)
Retic Ct Abs: 185.25 10*3/uL — ABNORMAL HIGH (ref 34.80–93.90)
WBC: 7.5 10*3/uL (ref 4.0–10.3)

## 2015-09-26 LAB — COMPREHENSIVE METABOLIC PANEL
ALK PHOS: 82 U/L (ref 40–150)
ALT: 118 U/L — ABNORMAL HIGH (ref 0–55)
AST: 69 U/L — ABNORMAL HIGH (ref 5–34)
Albumin: 3.6 g/dL (ref 3.5–5.0)
Anion Gap: 15 mEq/L — ABNORMAL HIGH (ref 3–11)
BUN: 28.4 mg/dL — ABNORMAL HIGH (ref 7.0–26.0)
CHLORIDE: 99 meq/L (ref 98–109)
CO2: 15 meq/L — AB (ref 22–29)
Calcium: 11.3 mg/dL — ABNORMAL HIGH (ref 8.4–10.4)
Creatinine: 1.3 mg/dL (ref 0.7–1.3)
EGFR: 62 mL/min/{1.73_m2} — AB (ref >90)
GLUCOSE: 307 mg/dL — AB (ref 70–140)
POTASSIUM: 4.9 meq/L (ref 3.5–5.1)
SODIUM: 128 meq/L — AB (ref 136–145)
Total Bilirubin: 0.69 mg/dL (ref 0.20–1.20)
Total Protein: 10.7 g/dL — ABNORMAL HIGH (ref 6.4–8.3)

## 2015-09-26 LAB — FERRITIN: FERRITIN: 156 ng/mL (ref 22–316)

## 2015-09-26 LAB — IRON AND TIBC
%SAT: 31 % (ref 20–55)
IRON: 114 ug/dL (ref 42–163)
TIBC: 363 ug/dL (ref 202–409)
UIBC: 248 ug/dL (ref 117–376)

## 2015-09-26 LAB — TECHNOLOGIST REVIEW

## 2015-09-26 NOTE — Progress Notes (Signed)
[facility] OFFICE PROGRESS NOTE  Osa Craver, MD [place] [address]  DIAGNOSIS: Thrombocytopenia ([place])  Splenomegaly  PROBLEM LIST:  1. Non-Hodgkin lymphoma (diffuse large cell lymphoma) stage IV only involving the left tibia diagnosed on 09/21/1988 at [facility] in [place], [place]. Bone marrow biopsy and liver biopsy in 1990 were negative. The patient underwent 1 cycle of Promace MOPP in 1990 followed by 8 cycles of Promace CytaBOM from 10/1988 - 01/1989. He had radiation therapy to left knee and tibia from 10/1988 - 01/1989. He has remained disease-free since completing therapy in late 1990. NIH patient number was [id number].  2. Left tibia bone biopsy x 3  3. Thrombocytopenia suspected to be on the basis of chronic ITP vs splenmegaly  4. DM type 2  5. Splenomegaly suspected to be secondary to splenic vein thrombosis  possibly related to previous episodes of chronic pancreatitis.  6. Chronic pancreatitis  7. Severe Hyperlipidemia/hypertriglyceridemia  8. Severe hepatic steatosis  9. Hypercalcemia  10. Diastasis recti  11. Recurrent cellulitis of the right lower extremity  12. Possible splenic vein thrombosis found on MRI of the abdomen on 02/07/2012.  13. Bilateral renal cysts found on MRI of the abdomen on 02/07/2012.  14. Pseudocyst/complex cystic lesion on tail of pancreas found on abdominal CT on 02/04/2012.  15. Varices noted within left upper quadrant on abdominal CT on 02/04/2012.   CURRENT TREATMENT: Observation.   INTERVAL HISTORY: Juan [name] 50 y.o. male with a history of thrombocytopenia in conjunction with splenomegaly and a remote history of non-Hodgkin lymphoma treated at the [facility] in 1990 without evidence of recurrence is here for follow up. He was last seen by me one year ago. He had 2 episodes of cellulitis in his lower extremities in the past year, the last one was a week ago, resolved after antibiotics. He also had a skin lesion  below the left knee, which was removed last week. He otherwise been doing well, denies any bleeding, no abdominal bloating, or other new complaints.  MEDICAL HISTORY: Past Medical History  Diagnosis Date  . Pancreatitis     occasional - last episode 06/2011  . Hyperlipidemia   . Fatty liver disease, nonalcoholic 3/53/2992  . Anemia     occasional - no problems currently(10/02/2011)  . Diabetes mellitus     IDDM  . Bilateral renal cysts 06/16/2011    states no known problems  . Non Hodgkin's lymphoma ([place]) 1991  . Hypertension     states no dx. of HTN, takes med. to protect kidneys due to DM  . Splenomegaly, congestive, chronic   . Stuffy and runny nose 10/02/2011    yellow drainage from nose  . Loose body in knee 09/2011    loose bodies left knee  . Lateral meniscus tear 09/2011    left  . Blood transfusion without reported diagnosis   . Coronary artery calcification seen on CAT scan   . Chest pain     positive Myoview stress test  . Abnormal cardiovascular stress test 02/06/2013  . CAD (coronary artery disease), LAD 90%, 1st diag 95%, LCX 70% wth AV groove 90%, RCA 40-50% mid and 80% long distal stenosis 02/03/13 02/04/2013  . S/P CABG x 4, 02/05/13 LIMA-LAD; LT. RADIAL-OM;VG-DIAG; VG-PDA 02/06/2013    INTERIM HISTORY: has Chronic pancreatitis ([place]); Hyperlipidemia; Hyperkalemia; Acute kidney injury ([place]); Dehydration; DM2 (diabetes mellitus, type 2) ([place]); Microcytic anemia; Fatty liver disease, nonalcoholic; Bilateral renal cysts; Splenomegaly; Thrombocytopenia ([place]); Other malignant lymphomas,  unspecified site, extranodal and solid organ sites; Coronary artery calcification seen on CAT scan; CAD (coronary artery disease), LAD 90%, 1st diag 95%, LCX 70% wth AV groove 90%, RCA 40-50% mid and 80% long distal stenosis 02/03/13; Abnormal cardiovascular stress test; S/P CABG x 4, 02/05/13 LIMA-LAD; LT. RADIAL-OM;VG-DIAG; VG-PDA; Hyperchylomicronemia; Personal history of lymphatic and  hematopoietic neoplasm; Personal history of digestive disease; and Chronic nonalcoholic liver disease on his problem list.    ALLERGIES:  has No Known Allergies.  MEDICATIONS: has a current medication list which includes the following prescription(s): aspirin, cephalexin, farxiga, fenofibrate, furosemide, insulin pump, insulin regular human concentrated, klor-con m10, metformin, metoprolol succinate, fish oil, one touch ultra test, ramipril, and simvastatin.  SURGICAL HISTORY:  Past Surgical History  Procedure Laterality Date  . Nasal septum surgery    . Tonsillectomy    . Elbow surgery    . Hernia repair      inguinal   . Tibia bone biopsy  x 3    left  . Knee arthroscopy  10/09/2011    Procedure: ARTHROSCOPY KNEE;  Surgeon: Nita Sells, MD;  Location: [facility];  Service: Orthopedics;  Laterality: Left;  . Coronary artery bypass graft N/A 02/05/2013    Procedure: CORONARY ARTERY BYPASS GRAFTING (CABG);  Surgeon: Ivin Poot, MD;  Location: [facility];  Service: Open Heart Surgery;  Laterality: N/A;  Coronary artery bypass graft on pump times four using left internal mammary artery and right greater saphenous vein via endovein harvest and left radial artery harvest.   . Intraoperative transesophageal echocardiogram N/A 02/05/2013    Procedure: INTRAOPERATIVE TRANSESOPHAGEAL ECHOCARDIOGRAM;  Surgeon: Ivin Poot, MD;  Location: [facility];  Service: Open Heart Surgery;  Laterality: N/A;  . Radial artery harvest Left 02/05/2013    Procedure: RADIAL ARTERY HARVEST;  Surgeon: Ivin Poot, MD;  Location: [facility];  Service: Vascular;  Laterality: Left;  . Left heart catheterization with coronary angiogram N/A 02/03/2013    Procedure: LEFT HEART CATHETERIZATION WITH CORONARY ANGIOGRAM;  Surgeon: Lorretta Harp, MD;  Location: [facility] CATH LAB;  Service: Cardiovascular;  Laterality: N/A;    REVIEW OF SYSTEMS:   Constitutional: Denies fevers, chills or abnormal weight  loss Eyes: Denies blurriness of vision Ears, nose, mouth, throat, and face: Denies mucositis or sore throat Respiratory: Denies cough, dyspnea or wheezes Cardiovascular: Denies palpitation, chest discomfort or lower extremity swelling Gastrointestinal:  Denies nausea, heartburn or change in bowel habits Skin: Denies abnormal skin rashes Lymphatics: Denies new lymphadenopathy or easy bruising Neurological:Denies numbness, tingling or new weaknesses Behavioral/Psych: Mood is stable, no new changes  All other systems were reviewed with the patient and are negative.  PHYSICAL EXAMINATION: ECOG PERFORMANCE STATUS: 0 - Asymptomatic  Blood pressure 145/79, pulse 87, temperature 98.2 F (36.8 C), temperature source Oral, resp. rate 18, height 5' 11"  (1.803 m), weight 231 lb 12.8 oz (105.144 kg), SpO2 97 %.  GENERAL:alert, no distress and comfortable; moderately overweight SKIN: skin color, texture, turgor are normal, no rashes or significant lesions; whitish dryness around pinnacle of left ear.  EYES: normal, Conjunctiva are pink and non-injected, sclera clear OROPHARYNX:no exudate, no erythema and lips, buccal mucosa, and tongue normal  NECK: supple, thyroid normal size, non-tender, without nodularity LYMPH:  no palpable lymphadenopathy in the cervical, axillary or supraclavicular LUNGS: clear to auscultation with normal breathing effort, no wheezes or rhonchi HEART: regular rate & rhythm and no murmurs and no lower extremity edema; chest mid-line scar that is well healed.  ABDOMEN:abdomen soft, non-tender and normal bowel sounds Musculoskeletal:no cyanosis of digits and no clubbing; R toe swollen with erythrema around toe nail bed.  NEURO: alert & oriented x 3 with fluent speech, no focal motor/sensory deficits  Labs:  CBC Latest Ref Rng 09/26/2015 08/24/2014 03/01/2014  WBC 4.0 - 10.3 10e3/uL 7.5 3.9(L) 5.8  Hemoglobin 13.0 - 17.1 g/dL 13.5 by plasma replacement for lipemia 13.4 12.8(L)   Hematocrit 38.4 - 49.9 % 38.4 37.9(L) 36.5(L)  Platelets 140 - 400 10e3/uL 149 84(L) 104(L)    CMP Latest Ref Rng 09/26/2015 08/24/2014 03/01/2014  Glucose 70 - 140 mg/dl 307(H) 219 specimen Lipemic(H) 266(H)  BUN 7.0 - 26.0 mg/dL 28.4(H) 11.6 30(H)  Creatinine 0.7 - 1.3 mg/dL 1.3 0.9 0.96  Sodium 136 - 145 mEq/L 128(L) 135(L) 131(L)  Potassium 3.5 - 5.1 mEq/L 4.9 4.7 4.8  Chloride 96 - 112 mEq/L - - 96  CO2 22 - 29 mEq/L 15(L) 17(L) 17(L)  Calcium 8.4 - 10.4 mg/dL 11.3(H) 10.3 11.0(H)  Total Protein 6.4 - 8.3 g/dL 10.7(H) 8.3 7.8  Total Bilirubin 0.20 - 1.20 mg/dL 0.69 0.27 0.3  Alkaline Phos 40 - 150 U/L 82 63 54  AST 5 - 34 U/L 69 Result Confirmed by Manual Dilution, sample extremely lipemic(H) 85(H) <5  ALT 0 - 55 U/L 118 Result Confirmed by Manual Dilution, sample extremely lipemic(H) 130(H) <5     RADIOGRAPHIC STUDIES: 1. US abdomen complete on 06/15/2011 showed no gallstones are noted within gallbladder. Normal CBD. Again noted fatty infiltration of the liver. Significant splenomegaly. Bilateral renal cyst. No hydronephrosis or diagnostic renal calculus. Splenic volume was 1418.7 mL. The spleen measured 17.5 cm in length.  2. Portable chest x-ray on 06/16/2011 showed low volumes with new atelectasis or infiltrate at the left lung base.  3. CT of the chest with contrast on 02/04/2012 showed no active infiltrate or effusion. Minimal peribronchovascular nodularity in the periphery of the right upper lobe may be residual scarring from prior infection, but atypical infection cannot be excluded. Age advanced coronary artery calcifications in the distribution of the left anterior descending coronary artery.  4. CT of the abdomen with contrast on 02/04/2012 showed severe fatty infiltration of the liver with areas of sparing. No ductal dilatation.  Moderate splenomegaly with varices in the left upper quadrant. Low attenuation lesion 2.3 x 3.5 x 2.3 cm in the tail of the pancreas may represent  pseudocyst from prior pancreatitis. A cystic pancreatic neoplasm cannot be excluded with this exam. Correlation with patient history is recommended as well as follow-up CT or preferably MRI to assess further.  5. MRI of the abdomen with and without contrast on 02/07/2012 showed a 2.9 cm complex cystic lesion with layering debris along the pancreatic tail, most suggestive of a pancreatic pseudocyst. Splenomegaly was present with the spleen measuring 20.1 cm in maximum dimension. There was suspected splenic vein thrombosis with associated perisplenic collaterals. The presence of well-developed collaterals suggests that this is not acute. Severe hepatic steatosis. 3.1 x 3.7 cm right renal cyst (series 8/image 39). Additional small bilateral renal cysts measuring up to 12 mm. No hydronephrosis.    ASSESSMENT: Juan [name] 50 y.o. male with a history of Thrombocytopenia ([place])  Splenomegaly   PLAN:   1. Chronic ITP vs thrombocytopenia secondary to splenomegaly.  --Clinically, the patient is doing well. His plt count is 149 today.  I reviewed his previous CBC, his platelet count has been in the range of 75-130, with fluctuation. -We'll continue  observation. We discussed the risk of bleeding, avoid other NSAIDs, avoid contact sports and injury.   2. History of NHL (1990) --He has no symptoms to suggest disease recurrence.  The patient states that he had a bone marrow at the time of the diagnosis of his lymphoma in 1990. The bone marrow at that time was negative.  -His clinically doing well, no B symptoms, no adenopathy on exam, no evidence of recurrence.  3. Splenomegaly, severe hepatic steatosis -He has intermittent elevated liver enzymes, clinically no symptoms. Her last ultrasound was one year ago -We discussed severe hepatic state ptosis can cause liver cirrhosis, we'll monitor with ultrasound yearly. -I checked his hepatitis B and C and HIV today, result is still pending  -I recommend him to  have liver ultrasound once a year  for monitoring, and liver cancer screening.  3. Follow up.  -His blood count has been stable overall, OK to follow-up with primary care physician and repeat his CBC 1-2 times a year -I strongly encouraged him to lose some weight and control his blood glucose and lipid better, he will follow-up with his his endocrinologist and primary care physician. -I'll see him as needed in the future -I order a liver and spleen ultrasound to be done in the next month, and he will call me for the result when he completes the ultrasound.  All questions were answered. The patient knows to call the clinic with any problems, questions or concerns. We can certainly see the patient much sooner if necessary.  I spent 20 minutes counseling the patient face to face. The total time spent in the appointment was 25 minutes.    Truitt Merle, MD 09/26/2015 10:57 AM

## 2015-09-27 LAB — HEPATITIS B SURFACE ANTIGEN: HBsAg Screen: NEGATIVE

## 2015-09-27 LAB — HIV ANTIBODY (ROUTINE TESTING W REFLEX): HIV Screen 4th Generation wRfx: NONREACTIVE

## 2015-09-27 LAB — HEPATITIS C ANTIBODY: Hep C Virus Ab: 0.2 s/co ratio (ref 0.0–0.9)

## 2015-09-27 LAB — HEPATITIS B CORE ANTIBODY, TOTAL: HEP B C TOTAL AB: NEGATIVE

## 2015-09-27 LAB — HEPATITIS B SURFACE ANTIBODY,QUALITATIVE: Hep B Surface Ab, Qual: NONREACTIVE

## 2015-10-06 ENCOUNTER — Telehealth: Payer: Self-pay | Admitting: *Deleted

## 2015-10-06 ENCOUNTER — Other Ambulatory Visit: Payer: Self-pay | Admitting: Hematology

## 2015-10-06 ENCOUNTER — Ambulatory Visit (HOSPITAL_COMMUNITY)
Admission: RE | Admit: 2015-10-06 | Discharge: 2015-10-06 | Disposition: A | Discharge status: Home / Self Care | Payer: Managed Care, Other (non HMO) | Source: Ambulatory Visit | Attending: Hematology | Admitting: Hematology

## 2015-10-06 DIAGNOSIS — R161 Splenomegaly, not elsewhere classified: Secondary | ICD-10-CM | POA: Insufficient documentation

## 2015-10-06 DIAGNOSIS — R140 Abdominal distension (gaseous): Secondary | ICD-10-CM | POA: Diagnosis not present

## 2015-10-06 DIAGNOSIS — K760 Fatty (change of) liver, not elsewhere classified: Secondary | ICD-10-CM | POA: Diagnosis present

## 2015-10-06 DIAGNOSIS — N281 Cyst of kidney, acquired: Secondary | ICD-10-CM | POA: Diagnosis not present

## 2015-10-06 NOTE — Telephone Encounter (Signed)
Spoke with pt and informed pt re:  Per Dr. Burr Medico,  Abdomen US showed stable findings of fatty liver and splenomegaly, no other concerns.   Asked pt to follow up with his PCP as per Dr. Burr Medico.  Pt responded  " probably not; these have been stable for a long time ".  Pt understood to follow up with our office as needed in the future.

## 2015-10-10 ENCOUNTER — Telehealth: Payer: Self-pay

## 2015-10-10 NOTE — Telephone Encounter (Signed)
Spoke to pt's wife about hep b and c test results.  Let her know they were negative.  She verbalized understanding

## 2015-11-02 ENCOUNTER — Encounter: Payer: Self-pay | Admitting: Cardiovascular Disease

## 2015-11-02 ENCOUNTER — Encounter (INDEPENDENT_AMBULATORY_CARE_PROVIDER_SITE_OTHER): Payer: Self-pay

## 2015-11-02 ENCOUNTER — Ambulatory Visit (INDEPENDENT_AMBULATORY_CARE_PROVIDER_SITE_OTHER): Payer: Managed Care, Other (non HMO) | Admitting: Cardiovascular Disease

## 2015-11-02 DIAGNOSIS — E785 Hyperlipidemia, unspecified: Secondary | ICD-10-CM | POA: Diagnosis not present

## 2015-11-02 DIAGNOSIS — I2583 Coronary atherosclerosis due to lipid rich plaque: Secondary | ICD-10-CM

## 2015-11-02 DIAGNOSIS — I2510 Atherosclerotic heart disease of native coronary artery without angina pectoris: Secondary | ICD-10-CM

## 2015-11-02 NOTE — Progress Notes (Signed)
11/02/2015 Juan [name]   1966/03/03  [id number]  Primary Physician Osa Craver, MD Primary Cardiologist: Lorretta Harp MD [name]  HPI:  The patient returns today for followup. He is a 50 year old mild to moderately overweight, married Caucasian male, father of 1 child who I last saw 07/21/15. He has seen Dr. Sallyanne Kuster twice in 2011. He has a long history of insulin-dependent diabetes as well as extremely high hypertriglyceridemia in the 3000-6000 range with multiple episodes of pancreatitis. He had negative venous Dopplers for DVTs. He does wear compression stockings. He has complained of left inframammary chest pain and increasing shortness of breath. He is not aware of his family history since he was adopted. He had a CT scan of his chest and abdomen which showed a mass at the tail of his pancreas but also showed severe calcification in the LAD. He did have a Myoview stress test performed 2 years ago that was nonischemic. At that time, he had no symptoms of chest pain or shortness of breath. I referred him to Dr. Lavetta Nielsen at [facility] for more intense treatment of his severe hypertriglyceridemia.since I saw him last in December of last several weeks he developed exertional chest pain and shortness of breath.I was concerned that he has developed progression of disease given all of his risk factors.  He had a Myoview stress test performed that showed inferior ischemia which is high risk. He continued to have exertional chest pain with left upper irradiation.I performed cardiac catheterization on him 02/03/13 revealing three-vessel disease and preserved LV function. He subsequently underwent coronary artery bypass bypass grafting x4 by Dr. Tharon Aquas Trigt with a LIMA to his LAD, a left radial to obtuse marginal branch, vein graft to an diagonal branch and PDA. His postop course was uncomplicated. He completed 7 weeks her cardiac rehabilitation and is back  to work. Since I saw him in the office 3 months ago he's had somewhat of a rough time over the last several months due to the excessive heat in July increasing dyspnea however recently he has felt somewhat better. He denies chest pain.  Current Outpatient Prescriptions  Medication Sig Dispense Refill  . aspirin EC 325 MG EC tablet Take 1 tablet (325 mg total) by mouth daily.    Marland Kitchen FARXIGA 5 MG TABS tablet Take 5 mg by mouth daily.  11  . fenofibrate 160 MG tablet Take 160 mg by mouth daily. PM    . Insulin Human (INSULIN PUMP) SOLN Inject into the skin.    Marland Kitchen insulin regular human CONCENTRATED (HUMULIN R) 500 UNIT/ML SOLN injection Inject into the skin.     Marland Kitchen KLOR-CON M10 10 MEQ tablet TAKE 1 TABLET BY MOUTH ONCE DAILY WITH FOOD WITH USE OF FLUID  3  . metFORMIN (GLUCOPHAGE) 1000 MG tablet Take 1,000 mg by mouth 2 (two) times daily with a meal.    . metoprolol succinate (TOPROL-XL) 50 MG 24 hr tablet TAKE 1 TABLET BY MOUTH EVERY DAY WITH OR IMMEDIATELY FOLLOWING A MEAL 90 tablet 3  . Omega-3 Fatty Acids (FISH OIL) 1000 MG CAPS Take 2,000-3,000 mg by mouth daily.    . ramipril (ALTACE) 5 MG capsule Take 5 mg by mouth daily.  4  . simvastatin (ZOCOR) 10 MG tablet TAKE 1 TABLET(10 MG) BY MOUTH DAILY 90 tablet 3   No current facility-administered medications for this visit.     No Known Allergies  Social History  Social History  . Marital status: Married    Spouse name: [name]  . Number of children: 1  . Years of education: 49   Occupational History  . 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 Smoking status: Never Smoker  . Smokeless tobacco: Never Used  . Alcohol use No  . Drug use: No  . Sexual activity: Yes   Other Topics Concern  . Not on file   Social History Narrative   Adopted, so no pertinent FH.  Married.  Lives with wife in [place].  Independent of ADLs and ambulation.     Review of Systems: General: negative for chills,  fever, night sweats or weight changes.  Cardiovascular: negative for chest pain, dyspnea on exertion, edema, orthopnea, palpitations, paroxysmal nocturnal dyspnea or shortness of breath Dermatological: negative for rash Respiratory: negative for cough or wheezing Urologic: negative for hematuria Abdominal: negative for nausea, vomiting, diarrhea, bright red blood per rectum, melena, or hematemesis Neurologic: negative for visual changes, syncope, or dizziness All other systems reviewed and are otherwise negative except as noted above.    Blood pressure 137/77, pulse 79, height 5\' 11"  (1.803 m), weight 235 lb 12.8 oz (107 kg).  General appearance: alert and no distress Neck: no adenopathy, no carotid bruit, no JVD, supple, symmetrical, trachea midline and thyroid not enlarged, symmetric, no tenderness/mass/nodules Lungs: clear to auscultation bilaterally Heart: normal apical impulse Extremities: extremities normal, atraumatic, no cyanosis or edema  EKG not performed today  ASSESSMENT AND PLAN:   Hyperlipidemia History of hyperlipidemia and hypertriglyceridemia on fenofibrate and simvastatin followed by Dr. Lavetta Nielsen, lipidologist , at [facility].  CAD (coronary artery disease), LAD 90%, 1st diag 95%, LCX 70% wth AV groove 90%, RCA 40-50% mid and 80% long distal stenosis 02/03/13 History of CAD status post cardiac catheterization 02/03/13 after a high-risk Myoview stress test that showed three-vessel disease and preserved LV function. He suddenly underwent coronary artery bypass grafting X 4 by Dr. Tharon Aquas Trigt the LIMA to his LAD, left radial to obtuse marginal branch, vein to a diagonal branch and PDA. He denies chest pain or shortness of breath.      Lorretta Harp MD FACP,FACC,FAHA, [facility] 11/02/2015 9:25 AM

## 2015-11-02 NOTE — Assessment & Plan Note (Signed)
History of CAD status post cardiac catheterization 02/03/13 after a high-risk Myoview stress test that showed three-vessel disease and preserved LV function. He suddenly underwent coronary artery bypass grafting X 4 by Dr. Tharon Aquas Trigt the LIMA to his LAD, left radial to obtuse marginal branch, vein to a diagonal branch and PDA. He denies chest pain or shortness of breath.

## 2015-11-02 NOTE — Assessment & Plan Note (Signed)
History of hyperlipidemia and hypertriglyceridemia on fenofibrate and simvastatin followed by Dr. Lavetta Nielsen, lipidologist , at [facility].

## 2015-11-02 NOTE — Patient Instructions (Signed)
Medication Instructions:  Your physician recommends that you continue on your current medications as directed. Please refer to the Current Medication list given to you today.   Labwork: Labwork will be requested from your primary care physician/endocrinologist.   Follow-Up: We request that you follow-up in: 6  MONTHS with an extender and in 12 MONTHS with Dr Andria Rhein will receive a reminder letter in the mail two months in advance. If you don't receive a letter, please call our office to schedule the follow-up appointment.   If you need a refill on your cardiac medications before your next appointment, please call your pharmacy.

## 2016-02-03 ENCOUNTER — Encounter: Payer: Self-pay | Admitting: Cardiology

## 2016-02-03 ENCOUNTER — Ambulatory Visit (INDEPENDENT_AMBULATORY_CARE_PROVIDER_SITE_OTHER): Payer: Managed Care, Other (non HMO) | Admitting: Cardiology

## 2016-02-03 VITALS — BP 128/68 | HR 79 | Ht 71.0 in | Wt 232.6 lb

## 2016-02-03 DIAGNOSIS — R0600 Dyspnea, unspecified: Secondary | ICD-10-CM | POA: Insufficient documentation

## 2016-02-03 DIAGNOSIS — R600 Localized edema: Secondary | ICD-10-CM | POA: Insufficient documentation

## 2016-02-03 DIAGNOSIS — R0609 Other forms of dyspnea: Secondary | ICD-10-CM

## 2016-02-03 DIAGNOSIS — Z951 Presence of aortocoronary bypass graft: Secondary | ICD-10-CM

## 2016-02-03 MED ORDER — HYDROCHLOROTHIAZIDE 12.5 MG PO CAPS: 12.5000 mg | ORAL_CAPSULE | ORAL | 9 refills | Status: DC

## 2016-02-03 NOTE — Assessment & Plan Note (Addendum)
Hyperlipidemia/hypertriglyceridemia Followed by Dr Arther Dames at [facility]

## 2016-02-03 NOTE — Assessment & Plan Note (Signed)
This is a chronic problem but worse the last 2-3 weeks.

## 2016-02-03 NOTE — Assessment & Plan Note (Signed)
S/P CABG x 4, 02/05/13 LIMA-LAD; LT. RADIAL-OM;VG-DIAG; VG-PDA Pre CABG symptoms were DOE

## 2016-02-03 NOTE — Progress Notes (Signed)
02/03/2016 Juan [name]   1965/08/23  [id number]  Primary Physician Osa Craver, MD Primary Cardiologist: Dr Gwenlyn Found  HPI:  50 y/o male followed by Dr Gwenlyn Found with a history of CAD, s/p CABG x 20 Jan 2013. The pt's main compliant then was DOE. He had an abnormal Myoview followed by a cath that showed 3V CAD and normal LVF. The pt tells me Dr Gwenlyn Found told him not to do anything strenuous in heat > 85 degrees or cold. The pt is in the office today with family (who did not contribute to the interview) with complaints of DOE and an increase in his chronic LE edema over the past 2-3 weeks. His DOE is when he walks outside. He denies any orthopnea or chest pain. He does say he noted some Lt arm pain walking the other day.    Current Outpatient Prescriptions  Medication Sig Dispense Refill  . aspirin EC 325 MG EC tablet Take 1 tablet (325 mg total) by mouth daily.    Marland Kitchen FARXIGA 5 MG TABS tablet Take 5 mg by mouth daily.  11  . fenofibrate 160 MG tablet Take 160 mg by mouth daily. PM    . Insulin Human (INSULIN PUMP) SOLN Inject into the skin.    Marland Kitchen insulin regular human CONCENTRATED (HUMULIN R) 500 UNIT/ML SOLN injection Inject into the skin.     Marland Kitchen KLOR-CON M10 10 MEQ tablet TAKE 1 TABLET BY MOUTH ONCE DAILY WITH FOOD WITH USE OF FLUID  3  . metFORMIN (GLUCOPHAGE) 1000 MG tablet Take 1,000 mg by mouth 2 (two) times daily with a meal.    . metoprolol succinate (TOPROL-XL) 50 MG 24 hr tablet TAKE 1 TABLET BY MOUTH EVERY DAY WITH OR IMMEDIATELY FOLLOWING A MEAL 90 tablet 3  . Omega-3 Fatty Acids (FISH OIL) 1000 MG CAPS Take 2,000-3,000 mg by mouth daily.    . ramipril (ALTACE) 5 MG capsule Take 5 mg by mouth daily.  4  . simvastatin (ZOCOR) 10 MG tablet TAKE 1 TABLET(10 MG) BY MOUTH DAILY 90 tablet 3   No current facility-administered medications for this visit.     No Known Allergies  Social History   Social History  . Marital status: Married    Spouse name: [name]  . Number of children:  1  . 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 Smoking status: Never Smoker  . Smokeless tobacco: Never Used  . Alcohol use No  . Drug use: No  . Sexual activity: Yes   Other Topics Concern  . Not on file   Social History Narrative   Adopted, so no pertinent FH.  Married.  Lives with wife in [place].  Independent of ADLs and ambulation.     Review of Systems: General: negative for chills, fever, night sweats or weight changes.  Cardiovascular: negative for chest pain, orthopnea, palpitations, paroxysmal nocturnal dyspnea or shortness of breath Dermatological: negative for rash Respiratory: negative for cough or wheezing Urologic: negative for hematuria Abdominal: negative for nausea, vomiting, diarrhea, bright red blood per rectum, melena, or hematemesis Neurologic: negative for visual changes, syncope, or dizziness All other systems reviewed and are otherwise negative except as noted above.    Blood pressure 128/68, pulse 79, height 5\' 11"  (1.803 m), weight 232 lb 9.6 oz (105.5 kg).  General appearance: alert, cooperative and no distress Neck: no carotid bruit and no JVD Lungs:  clear to auscultation bilaterally Heart: regular rate and rhythm Extremities: 1+ bilateral LE edema Skin: Skin color, texture, turgor normal. No rashes or lesions Neurologic: Grossly normal  EKG NSR  ASSESSMENT AND PLAN:   Dyslipidemia Hyperlipidemia/hypertriglyceridemia Followed by Dr Arther D[facility]  Edema of both legs This is a chronic problem but worse the last 2-3 weeks.  Dyspnea on exertion New in the last 2-3 weeks  S/P CABG x 4 S/P CABG x 4, 02/05/13 LIMA-LAD; LT. RADIAL-OM;VG-DIAG; VG-PDA Pre CABG symptoms were DOE  Type 2 diabetes mellitus treated with insulin ([place]) Followed by endocrinologist    PLAN  I suggested we proceed with an echo and Lexiscan. I provided him with an Rx for  HCTZ 12.5 mg 3 x week prn edema. F/U with dr Gwenlyn Found 3-4 weeks.   Kerin Ransom PA-C 02/03/2016 9:52 AM

## 2016-02-03 NOTE — Addendum Note (Signed)
Addended by: Diana Eves on: 02/03/2016 10:37 AM   Modules accepted: Orders

## 2016-02-03 NOTE — Assessment & Plan Note (Signed)
Followed by endocrinologist

## 2016-02-03 NOTE — Assessment & Plan Note (Signed)
New in the last 2-3 weeks

## 2016-02-03 NOTE — Patient Instructions (Signed)
Medication Instructions:  START- Hydrochlorathiazide 12.5 mg three time a week  Labwork: None Ordered  Testing/Procedures: Your physician has requested that you have an echocardiogram. Echocardiography is a painless test that uses sound waves to create images of your heart. It provides your doctor with information about the size and shape of your heart and how well your heart's chambers and valves are working. This procedure takes approximately one hour. There are no restrictions for this procedure.  Your physician has requested that you have a lexiscan myoview. For further information please visit [email or web address]. Please follow instruction sheet, as given.    Follow-Up: Your physician recommends that you schedule a follow-up appointment in: 3-4 weeks with Dr Gwenlyn Found   Any Other Special Instructions Will Be Listed Below (If Applicable).     Happy Thanksgiving  If you need a refill on your cardiac medications before your next appointment, please call your pharmacy.

## 2016-02-14 ENCOUNTER — Telehealth (HOSPITAL_COMMUNITY): Payer: Self-pay

## 2016-02-14 NOTE — Telephone Encounter (Signed)
Encounter complete. 

## 2016-02-15 ENCOUNTER — Telehealth (HOSPITAL_COMMUNITY): Payer: Self-pay

## 2016-02-15 NOTE — Telephone Encounter (Signed)
Pt to check blood sugar prior to appointment. Pt to bring glucometer with him so he can monitor his sugar throughout the test so we can stop his basil rate if his blood sugar level starts to drop.

## 2016-02-16 ENCOUNTER — Ambulatory Visit (HOSPITAL_COMMUNITY)
Admission: RE | Admit: 2016-02-16 | Discharge: 2016-02-16 | Disposition: A | Discharge status: Home / Self Care | Payer: Managed Care, Other (non HMO) | Source: Ambulatory Visit | Attending: Cardiology | Admitting: Cardiology

## 2016-02-16 DIAGNOSIS — Z951 Presence of aortocoronary bypass graft: Secondary | ICD-10-CM | POA: Diagnosis not present

## 2016-02-16 DIAGNOSIS — R0609 Other forms of dyspnea: Secondary | ICD-10-CM | POA: Insufficient documentation

## 2016-02-16 LAB — MYOCARDIAL PERFUSION IMAGING
LV dias vol: 127 mL (ref 62–150)
LV sys vol: 53 mL
Peak HR: 102 {beats}/min
Rest HR: 80 {beats}/min
SDS: 1
SRS: 1
SSS: 2
TID: 1.1

## 2016-02-16 MED ORDER — TECHNETIUM TC 99M TETROFOSMIN IV KIT
10.2000 | PACK | Freq: Once | INTRAVENOUS | Status: AC | PRN
  Administered 2016-02-16: 10.2 via INTRAVENOUS
  Filled 2016-02-16: qty 11

## 2016-02-16 MED ORDER — TECHNETIUM TC 99M TETROFOSMIN IV KIT
30.1000 | PACK | Freq: Once | INTRAVENOUS | Status: AC | PRN
  Administered 2016-02-16: 30.1 via INTRAVENOUS
  Filled 2016-02-16: qty 31

## 2016-02-16 MED ORDER — REGADENOSON 0.4 MG/5ML IV SOLN
0.4000 mg | Freq: Once | INTRAVENOUS | Status: AC
  Administered 2016-02-16: 0.4 mg via INTRAVENOUS

## 2016-03-01 ENCOUNTER — Other Ambulatory Visit: Payer: Self-pay

## 2016-03-01 ENCOUNTER — Ambulatory Visit (HOSPITAL_COMMUNITY): Payer: Managed Care, Other (non HMO) | Attending: Cardiology

## 2016-03-01 DIAGNOSIS — I517 Cardiomegaly: Secondary | ICD-10-CM | POA: Diagnosis not present

## 2016-03-01 DIAGNOSIS — E119 Type 2 diabetes mellitus without complications: Secondary | ICD-10-CM | POA: Diagnosis not present

## 2016-03-01 DIAGNOSIS — E785 Hyperlipidemia, unspecified: Secondary | ICD-10-CM | POA: Insufficient documentation

## 2016-03-01 DIAGNOSIS — R0600 Dyspnea, unspecified: Secondary | ICD-10-CM | POA: Diagnosis present

## 2016-03-01 DIAGNOSIS — R0609 Other forms of dyspnea: Secondary | ICD-10-CM | POA: Insufficient documentation

## 2016-03-01 DIAGNOSIS — R600 Localized edema: Secondary | ICD-10-CM | POA: Insufficient documentation

## 2016-03-01 DIAGNOSIS — I2510 Atherosclerotic heart disease of native coronary artery without angina pectoris: Secondary | ICD-10-CM | POA: Insufficient documentation

## 2016-03-01 DIAGNOSIS — Z951 Presence of aortocoronary bypass graft: Secondary | ICD-10-CM

## 2016-03-01 LAB — ECHOCARDIOGRAM COMPLETE
E decel time: 187 msec
E/e' ratio: 13.41
FS: 31 % (ref 28–44)
IVS/LV PW RATIO, ED: 1.42
LA ID, A-P, ES: 43 mm
LA diam end sys: 43 mm
LA diam index: 1.91 cm/m2
LV E/e' medial: 13.41
LV E/e'average: 13.41
LV PW d: 8.03 mm — AB (ref 0.6–1.1)
LV e' LATERAL: 7.68 cm/s
LVOT SV: 71 mL
LVOT VTI: 20.4 cm
LVOT area: 3.46 cm2
LVOT diameter: 21 mm
LVOT peak vel: 108 cm/s
MV Dec: 187
MV Peak grad: 4 mmHg
MV pk A vel: 74.5 m/s
MV pk E vel: 103 m/s
TDI e' lateral: 7.68
TDI e' medial: 7.02

## 2016-03-02 ENCOUNTER — Encounter: Payer: Self-pay | Admitting: Cardiology

## 2016-03-02 DIAGNOSIS — I5189 Other ill-defined heart diseases: Secondary | ICD-10-CM | POA: Insufficient documentation

## 2016-03-23 ENCOUNTER — Encounter: Payer: Self-pay | Admitting: Cardiovascular Disease

## 2016-03-23 ENCOUNTER — Ambulatory Visit (INDEPENDENT_AMBULATORY_CARE_PROVIDER_SITE_OTHER): Payer: Commercial Managed Care - PPO | Admitting: Cardiovascular Disease

## 2016-03-23 DIAGNOSIS — R0609 Other forms of dyspnea: Secondary | ICD-10-CM

## 2016-03-23 NOTE — Progress Notes (Signed)
03/23/2016 Juan [name]   05-24-1965  [id number]  Primary Physician Osa Craver, MD Primary Cardiologist: Lorretta Harp MD [name]  HPI:  The patient returns today for followup. He is a 51 year old mild to moderately overweight, married Caucasian male, father of 1 child who I last saw in the office 11/02/15. He has seen Dr. Sallyanne Kuster twice in 2011. He has a long history of insulin-dependent diabetes as well as extremely high hypertriglyceridemia in the 3000-6000 range with multiple episodes of pancreatitis. He had negative venous Dopplers for DVTs. He does wear compression stockings. He has complained of left inframammary chest pain and increasing shortness of breath. He is not aware of his family history since he was adopted. He had a CT scan of his chest and abdomen which showed a mass at the tail of his pancreas but also showed severe calcification in the LAD. He did have a Myoview stress test performed 2 years ago that was nonischemic. At that time, he had no symptoms of chest pain or shortness of breath. I referred him to Dr. Lavetta Nielsen at [facility] for more intense treatment of his severe hypertriglyceridemia.since I saw him last in December of last several weeks he developed exertional chest pain and shortness of breath.I was concerned that he has developed progression of disease given all of his risk factors.  He had a Myoview stress test performed that showed inferior ischemia which is high risk. He continued to have exertional chest pain with left upper irradiation.I performed cardiac catheterization on him 02/03/13 revealing three-vessel disease and preserved LV function. He subsequently underwent coronary artery bypass bypass grafting x4 by Dr. Tharon Aquas Trigt with a LIMA to his LAD, a left radial to obtuse marginal branch, vein graft to an diagonal branch and PDA. His postop course was uncomplicated. He completed 7 weeks her cardiac  rehabilitation and is back to work. Since I saw him in the office 4 months ago he has seen Kerin Ransom back who ordered a 2-D echo that showed normal LV systolic function, grade 2 diastolic dysfunction and a Myoview stress test that was normal as well.   Current Outpatient Prescriptions  Medication Sig Dispense Refill  . aspirin EC 325 MG EC tablet Take 1 tablet (325 mg total) by mouth daily.    Marland Kitchen FARXIGA 5 MG TABS tablet Take 5 mg by mouth daily.  11  . fenofibrate 160 MG tablet Take 160 mg by mouth daily. PM    . hydrochlorothiazide (MICROZIDE) 12.5 MG capsule Take 1 capsule (12.5 mg total) by mouth 3 (three) times a week. 30 capsule 9  . Insulin Human (INSULIN PUMP) SOLN Inject into the skin.    Marland Kitchen insulin regular human CONCENTRATED (HUMULIN R) 500 UNIT/ML SOLN injection Inject into the skin.     Marland Kitchen KLOR-CON M10 10 MEQ tablet TAKE 1 TABLET BY MOUTH ONCE DAILY WITH FOOD WITH USE OF FLUID  3  . metFORMIN (GLUCOPHAGE) 1000 MG tablet Take 1,000 mg by mouth 2 (two) times daily with a meal.    . metoprolol succinate (TOPROL-XL) 50 MG 24 hr tablet TAKE 1 TABLET BY MOUTH EVERY DAY WITH OR IMMEDIATELY FOLLOWING A MEAL 90 tablet 3  . Omega-3 Fatty Acids (FISH OIL) 1000 MG CAPS Take 2,000-3,000 mg by mouth daily.    . ramipril (ALTACE) 5 MG capsule Take 5 mg by mouth daily.  4  . simvastatin (ZOCOR) 10 MG tablet TAKE 1 TABLET(10 MG) BY  MOUTH DAILY 90 tablet 3   No current facility-administered medications for this visit.     No Known Allergies  Social History   Social History  . Marital status: Married    Spouse name: [name]  . Number of children: 1  . 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 Smoking status: Never Smoker  . Smokeless tobacco: Never Used  . Alcohol use No  . Drug use: No  . Sexual activity: Yes   Other Topics Concern  . Not on file   Social History Narrative   Adopted, so no  pertinent FH.  Married.  Lives with wife in [place].  Independent of ADLs and ambulation.     Review of Systems: General: negative for chills, fever, night sweats or weight changes.  Cardiovascular: negative for chest pain, dyspnea on exertion, edema, orthopnea, palpitations, paroxysmal nocturnal dyspnea or shortness of breath Dermatological: negative for rash Respiratory: negative for cough or wheezing Urologic: negative for hematuria Abdominal: negative for nausea, vomiting, diarrhea, bright red blood per rectum, melena, or hematemesis Neurologic: negative for visual changes, syncope, or dizziness All other systems reviewed and are otherwise negative except as noted above.    Blood pressure 126/70, pulse 88, height 5\' 11"  (1.803 m), weight 237 lb (107.5 kg).  General appearance: alert and no distress Neck: no adenopathy, no carotid bruit, no JVD, supple, symmetrical, trachea midline and thyroid not enlarged, symmetric, no tenderness/mass/nodules Lungs: clear to auscultation bilaterally Heart: regular rate and rhythm, S1, S2 normal, no murmur, click, rub or gallop Extremities: 2+ left, 1+ right lower extremity pitting edema.  EKG not performed today  ASSESSMENT AND PLAN:   Dyspnea on exertion [name] returns today for follow-up of outpatient diagnostic tests ordered by Kerin Ransom [facility] to evaluate dyspnea on exertion and lower extremity edema. He had a Myoview stress test that was low risk performed 02/17/16 and a 2-D echocardiogram revealed normal LV systolic function and grade 2 diastolic dysfunction performed on 03/02/16. He does have 1-2+ pitting edema. He was started on a low-dose diuretic which has improved some of his symptoms. We talked about salt intake. We'll see him back in 6 months for follow-up.      Lorretta Harp MD FACP,FACC,FAHA, [facility] 03/23/2016 3:13 PM

## 2016-03-23 NOTE — Patient Instructions (Signed)

## 2016-03-23 NOTE — Assessment & Plan Note (Signed)
Harris returns today for follow-up of outpatient diagnostic tests ordered by Kerin Ransom [facility] to evaluate dyspnea on exertion and lower extremity edema. He had a Myoview stress test that was low risk performed 02/17/16 and a 2-D echocardiogram revealed normal LV systolic function and grade 2 diastolic dysfunction performed on 03/02/16. He does have 1-2+ pitting edema. He was started on a low-dose diuretic which has improved some of his symptoms. We talked about salt intake. We'll see him back in 6 months for follow-up.

## 2016-03-30 ENCOUNTER — Other Ambulatory Visit: Payer: Self-pay | Admitting: *Deleted

## 2016-03-30 MED ORDER — HYDROCHLOROTHIAZIDE 12.5 MG PO CAPS: 12.5000 mg | ORAL_CAPSULE | ORAL | 3 refills | Status: DC

## 2016-05-15 ENCOUNTER — Other Ambulatory Visit: Payer: Self-pay | Admitting: Cardiovascular Disease

## 2016-05-16 ENCOUNTER — Other Ambulatory Visit: Payer: Self-pay | Admitting: Cardiovascular Disease

## 2016-05-18 ENCOUNTER — Other Ambulatory Visit: Payer: Self-pay

## 2016-05-18 ENCOUNTER — Telehealth: Payer: Self-pay | Admitting: Cardiovascular Disease

## 2016-05-18 ENCOUNTER — Other Ambulatory Visit: Payer: Self-pay | Admitting: Cardiovascular Disease

## 2016-05-18 NOTE — Telephone Encounter (Signed)
Routed to provider/primary for review. See refill encounter 05/15/16.

## 2016-05-18 NOTE — Telephone Encounter (Signed)
Cholesterol followed by Dr. Lavetta Nielsen, lipidologist with [facility]. Will request recent lipid panels from his office for Dr. Gwenlyn Found to review.

## 2016-05-18 NOTE — Telephone Encounter (Signed)
New Message      *STAT* If patient is at the pharmacy, call can be transferred to refill team.   1. Which medications need to be refilled? (please list name of each medication and dose if known)  simvastatin (ZOCOR) 10 MG tablet TAKE 1 TABLET(10 MG) BY MOUTH DAILY     2. Which pharmacy/location (including street and city if local pharmacy) is medication to be sent to? [facility] [address]  3. Do they need a 30 day or 90 day supply? [place]

## 2016-12-14 ENCOUNTER — Ambulatory Visit
Admission: RE | Admit: 2016-12-14 | Discharge: 2016-12-14 | Disposition: A | Discharge status: Home / Self Care | Payer: Commercial Managed Care - PPO | Source: Ambulatory Visit | Attending: Cardiovascular Disease | Admitting: Cardiovascular Disease

## 2016-12-14 ENCOUNTER — Encounter: Payer: Self-pay | Admitting: Cardiovascular Disease

## 2016-12-14 ENCOUNTER — Ambulatory Visit (INDEPENDENT_AMBULATORY_CARE_PROVIDER_SITE_OTHER): Payer: Commercial Managed Care - PPO | Admitting: Cardiovascular Disease

## 2016-12-14 ENCOUNTER — Other Ambulatory Visit: Payer: Self-pay | Admitting: Cardiovascular Disease

## 2016-12-14 ENCOUNTER — Ambulatory Visit (HOSPITAL_COMMUNITY)
Admission: RE | Admit: 2016-12-14 | Discharge: 2016-12-14 | Disposition: A | Discharge status: Home / Self Care | Payer: Commercial Managed Care - PPO | Source: Ambulatory Visit | Attending: Cardiovascular Disease | Admitting: Cardiovascular Disease

## 2016-12-14 VITALS — BP 134/80 | HR 88 | Ht 71.0 in | Wt 243.0 lb

## 2016-12-14 DIAGNOSIS — I10 Essential (primary) hypertension: Secondary | ICD-10-CM | POA: Insufficient documentation

## 2016-12-14 DIAGNOSIS — R0989 Other specified symptoms and signs involving the circulatory and respiratory systems: Secondary | ICD-10-CM

## 2016-12-14 DIAGNOSIS — R079 Chest pain, unspecified: Secondary | ICD-10-CM

## 2016-12-14 DIAGNOSIS — E785 Hyperlipidemia, unspecified: Secondary | ICD-10-CM | POA: Insufficient documentation

## 2016-12-14 DIAGNOSIS — E119 Type 2 diabetes mellitus without complications: Secondary | ICD-10-CM | POA: Insufficient documentation

## 2016-12-14 DIAGNOSIS — I2510 Atherosclerotic heart disease of native coronary artery without angina pectoris: Secondary | ICD-10-CM | POA: Diagnosis not present

## 2016-12-14 DIAGNOSIS — R609 Edema, unspecified: Secondary | ICD-10-CM | POA: Insufficient documentation

## 2016-12-14 DIAGNOSIS — M7989 Other specified soft tissue disorders: Secondary | ICD-10-CM | POA: Diagnosis not present

## 2016-12-14 MED ORDER — ISOSORBIDE MONONITRATE ER 30 MG PO TB24: 15.0000 mg | ORAL_TABLET | Freq: Every day | ORAL | 1 refills | Status: DC

## 2016-12-14 MED ORDER — FUROSEMIDE 40 MG PO TABS: 40.0000 mg | ORAL_TABLET | Freq: Every day | ORAL | 6 refills | Status: DC

## 2016-12-14 NOTE — Progress Notes (Signed)
12/14/2016 Juan [name]   [birth year]  [id number]  Primary Physician System, Pcp Not In Primary Cardiologist: Lorretta Harp MD FACP, [place], [place], [place]  HPI:  Juan [name] is a 51 y.o. male mild to moderately overweight, married Caucasian male, father of 1 child who I last saw in the office 03/23/16. He has seen Dr. Sallyanne Kuster twice in 2011. He has a long history of insulin-dependent diabetes as well as extremely high hypertriglyceridemia in the 3000-6000 range with multiple episodes of pancreatitis. He had negative venous Dopplers for DVTs. He does wear compression stockings. He has complained of left inframammary chest pain and increasing shortness of breath. He is not aware of his family history since he was adopted. He had a CT scan of his chest and abdomen which showed a mass at the tail of his pancreas but also showed severe calcification in the LAD. He did have a Myoview stress test performed 2 years ago that was nonischemic. At that time, he had no symptoms of chest pain or shortness of breath. I referred him to Dr. Lavetta Nielsen at [facility] for more intense treatment of his severe hypertriglyceridemia.since I saw him last in December of last several weeks he developed exertional chest pain and shortness of breath.I was concerned that he has developed progression of disease given all of his risk factors.  He had a Myoview stress test performed that showed inferior ischemia which is high risk. He continued to have exertional chest pain with left upper irradiation.I performed cardiac catheterization on him 02/03/13 revealing three-vessel disease and preserved LV function. He subsequently underwent coronary artery bypass bypass grafting x4 by Dr. Tharon Aquas Trigt with a LIMA to his LAD, a left radial to obtuse marginal branch, vein graft to an diagonal branch and PDA. His postop course was uncomplicated. He completed 7 weeks her cardiac rehabilitation and is back to work. Since  I saw him in the office 4 months ago he has seen Kerin Ransom back who ordered a 2-D echo that showed normal LV systolic function, grade 2 diastolic dysfunction and a Myoview stress test that was normal as well. Since I saw him in the office back in January he started some increasing dyspnea on exertion over the last 6 months and new onset substernal chest pressure which is primarily exertional in nature with left upper extremity radiation over the last week. In addition, he had an episode of cellulitis in his right lower extremity a month ago treated with antibiotics which resulted in increased swelling which has not resolved.   Current Meds  Medication Sig  . aspirin EC 325 MG EC tablet Take 1 tablet (325 mg total) by mouth daily.  Marland Kitchen FARXIGA 5 MG TABS tablet Take 5 mg by mouth daily.  . fenofibrate 160 MG tablet Take 160 mg by mouth daily. PM  . furosemide (LASIX) 20 MG tablet Take 1 tablet by mouth daily.  . Insulin Human (INSULIN PUMP) SOLN Inject into the skin.  Marland Kitchen insulin regular human CONCENTRATED (HUMULIN R) 500 UNIT/ML SOLN injection Inject into the skin.   Marland Kitchen KLOR-CON M10 10 MEQ tablet TAKE 1 TABLET BY MOUTH ONCE DAILY WITH FOOD WITH USE OF FLUID  . metFORMIN (GLUCOPHAGE) 1000 MG tablet Take 1,000 mg by mouth 2 (two) times daily with a meal.  . metoprolol succinate (TOPROL-XL) 50 MG 24 hr tablet TAKE 1 TABLET EVERY DAY WITH OR IMMEDIATELY  . Omega-3 Fatty Acids (FISH OIL) 1000 MG CAPS  Take 2,000-3,000 mg by mouth daily.  . ramipril (ALTACE) 5 MG capsule Take 5 mg by mouth daily.  . simvastatin (ZOCOR) 10 MG tablet TAKE 1 TABLET(10 MG) BY MOUTH DAILY     No Known Allergies  Social History   Social History  . Marital status: Married    Spouse name: [name]  . Number of children: 1  . 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 Smoking status: Never Smoker  . Smokeless tobacco: Never Used  .  Alcohol use No  . Drug use: No  . Sexual activity: Yes   Other Topics Concern  . Not on file   Social History Narrative   Adopted, so no pertinent FH.  Married.  Lives with wife in [place].  Independent of ADLs and ambulation.     Review of Systems: General: negative for chills, fever, night sweats or weight changes.  Cardiovascular: negative for chest pain, dyspnea on exertion, edema, orthopnea, palpitations, paroxysmal nocturnal dyspnea or shortness of breath Dermatological: negative for rash Respiratory: negative for cough or wheezing Urologic: negative for hematuria Abdominal: negative for nausea, vomiting, diarrhea, bright red blood per rectum, melena, or hematemesis Neurologic: negative for visual changes, syncope, or dizziness All other systems reviewed and are otherwise negative except as noted above.    Blood pressure 134/80, pulse 88, height 5\' 11"  (1.803 m), weight 243 lb (110.2 kg).  General appearance: alert and no distress Neck: no adenopathy, no carotid bruit, no JVD, supple, symmetrical, trachea midline and thyroid not enlarged, symmetric, no tenderness/mass/nodules Lungs: clear to auscultation bilaterally Heart: regular rate and rhythm, S1, S2 normal, no murmur, click, rub or gallop Extremities: 2+ edema on the left, 2-3+ on the right. There is asymmetry of his legs. Pulses: 2+ and symmetric Skin: Skin color, texture, turgor normal. No rashes or lesions or Venous stasis changes bilaterally Neurologic: Alert and oriented X 3, normal strength and tone. Normal symmetric reflexes. Normal coordination and gait  EKG sinus rhythm at 88 with lateral T-wave inversion. I personally reviewed this EKG.  ASSESSMENT AND PLAN:   Dyslipidemia History of hyperlipidemia on fenofibrate and simvastatin followed by his PCP  S/P CABG x 4 History of coronary artery disease status post bypass grafting 4 by Dr. Tharon Aquas Trigt 02/05/13 with a LIMA to the LAD, left radial to  obtuse marginal branch, vein graft to diagonal branch and PDA. Postop course was uncomplicated. He had a 2-D echo performed 03/01/16 which was entirely normal and a Myoview stress test performed at the same time which with slow risk and nonischemic. He's noticed increasing dyspnea on exertion over the last several months and new onset substernal chest pain which is exertional in nature and which radiated to his left upper extremity over the last week. I'm going to add low-dose Imdur 15 mg a day and arrange for him to undergo chronic catheterization by myself on Monday.  Edema of both legs History of chronic lower extremity edema thought to be related to venous insufficiency with worsening edema and tenseness of his right calf which began a month ago during an episode of cellulitis. I'm going to get a venous Doppler study to rule out DVT today.      Lorretta Harp MD FACP,FACC,FAHA, [facility] 12/14/2016 2:58 PM

## 2016-12-14 NOTE — Assessment & Plan Note (Signed)
History of hyperlipidemia on fenofibrate and simvastatin followed by his PCP

## 2016-12-14 NOTE — Assessment & Plan Note (Signed)
History of chronic lower extremity edema thought to be related to venous insufficiency with worsening edema and tenseness of his right calf which began a month ago during an episode of cellulitis. I'm going to get a venous Doppler study to rule out DVT today.

## 2016-12-14 NOTE — Patient Instructions (Addendum)
Medication Instructions: Increase Lasix (Furosemide) to 40 mg daily.  START Isosorbide 30 mg--take 15 mg (1/2 tab) daily.  Testing: Your physician has requested that you have a lower extremity venous duplex. This test is an ultrasound of the veins in the legs or arms. It looks at venous blood flow that carries blood from the heart to the legs or arms. Allow one hour for a Lower Venous exam. Allow thirty minutes for an Upper Venous exam. There are no restrictions or special instructions.  Labwork: Please have labs done today in our office.  Testing/Procedures: A chest x-ray takes a picture of the organs and structures inside the chest, including the heart, lungs, and blood vessels. This test can show several things, including, whether the heart is enlarges; whether fluid is building up in the lungs; and whether pacemaker / defibrillator leads are still in place.     [facility] [address] [place] [address] Dept: [phone] Loc: [place]  12/14/2016  You are scheduled for a Cardiac Catheterization on Monday, October 1 with Dr. Quay Burow.  1. Please arrive at the [facility] (Main Entrance A) at [facility]: [place], [place] [address] at 9:00 AM (two hours before your procedure to ensure your preparation). Free valet parking service is available.   Special note: Every effort is made to have your procedure done on time. Please understand that emergencies sometimes delay scheduled procedures.  2. Diet: Do not eat or drink anything after midnight prior to your procedure except sips of water to take medications.  3. Labs: today in the office.  4. Medication instructions in preparation for your procedure:  Take 1/2 dose of Farxiga the night prior to your procedure and none on the day of.  Stop taking Metformin on Sunday, September 30.   On the  morning of your procedure, take your Aspirin and any morning medicines NOT listed above.  You may use sips of water.  5. Plan for one night stay--bring personal belongings. 6. Bring a current list of your medications and current insurance cards. 7. You MUST have a responsible person to drive you home. 8. Someone MUST be with you the first 24 hours after you arrive home or your discharge will be delayed. 9. Please wear clothes that are easy to get on and off and wear slip-on shoes.  Thank you for allowing [place] to care for you!   -- [facility]

## 2016-12-14 NOTE — Assessment & Plan Note (Signed)
History of coronary artery disease status post bypass grafting 4 by Dr. Tharon Aquas Trigt 02/05/13 with a LIMA to the LAD, left radial to obtuse marginal branch, vein graft to diagonal branch and PDA. Postop course was uncomplicated. He had a 2-D echo performed 03/01/16 which was entirely normal and a Myoview stress test performed at the same time which with slow risk and nonischemic. He's noticed increasing dyspnea on exertion over the last several months and new onset substernal chest pain which is exertional in nature and which radiated to his left upper extremity over the last week. I'm going to add low-dose Imdur 15 mg a day and arrange for him to undergo chronic catheterization by myself on Monday.

## 2016-12-15 LAB — BASIC METABOLIC PANEL
BUN/Creatinine Ratio: 39 — ABNORMAL HIGH (ref 9–20)
BUN: 19 mg/dL (ref 6–24)
CHLORIDE: 100 mmol/L (ref 96–106)
CO2: 18 mmol/L — ABNORMAL LOW (ref 20–29)
Calcium: 10 mg/dL (ref 8.7–10.2)
Creatinine, Ser: 0.49 mg/dL — ABNORMAL LOW (ref 0.76–1.27)
GFR, EST AFRICAN AMERICAN: 147 mL/min/{1.73_m2} (ref >59)
GFR, EST NON AFRICAN AMERICAN: 127 mL/min/{1.73_m2} (ref >59)
Glucose: 85 mg/dL (ref 65–99)
POTASSIUM: 4.2 mmol/L (ref 3.5–5.2)
SODIUM: 134 mmol/L (ref 134–144)

## 2016-12-15 LAB — CBC WITH DIFFERENTIAL/PLATELET
BASOS ABS: 0.1 10*3/uL (ref 0.0–0.2)
BASOS: 1 %
EOS (ABSOLUTE): 0.4 10*3/uL (ref 0.0–0.4)
Eos: 5 %
Hematocrit: 40 % (ref 37.5–51.0)
Hemoglobin: 13.9 g/dL (ref 13.0–17.7)
Immature Grans (Abs): 0.1 10*3/uL (ref 0.0–0.1)
Immature Granulocytes: 1 %
LYMPHS ABS: 2.1 10*3/uL (ref 0.7–3.1)
LYMPHS: 30 %
MCH: 27.4 pg (ref 26.6–33.0)
MCHC: 34.8 g/dL (ref 31.5–35.7)
MCV: 79 fL (ref 79–97)
MONOS ABS: 0.8 10*3/uL (ref 0.1–0.9)
Monocytes: 11 %
NEUTROS ABS: 3.7 10*3/uL (ref 1.4–7.0)
Neutrophils: 52 %
PLATELETS: 123 10*3/uL — AB (ref 150–379)
RBC: 5.07 x10E6/uL (ref 4.14–5.80)
RDW: 17.1 % — AB (ref 12.3–15.4)
WBC: 7.2 10*3/uL (ref 3.4–10.8)

## 2016-12-15 LAB — TSH: TSH: 2.26 u[IU]/mL (ref 0.450–4.500)

## 2016-12-15 LAB — APTT: APTT: 25 s (ref 24–33)

## 2016-12-15 LAB — PROTIME-INR
INR: 1 (ref 0.8–1.2)
PROTHROMBIN TIME: 9.9 s (ref 9.1–12.0)

## 2016-12-17 ENCOUNTER — Ambulatory Visit (HOSPITAL_COMMUNITY)
Admission: RE | Disposition: A | Discharge status: Home / Self Care | Payer: Self-pay | Source: Ambulatory Visit | Attending: Cardiovascular Disease

## 2016-12-17 ENCOUNTER — Ambulatory Visit (HOSPITAL_COMMUNITY)
Admission: RE | Admit: 2016-12-17 | Discharge: 2016-12-18 | Disposition: A | Discharge status: Home / Self Care | Payer: Commercial Managed Care - PPO | Source: Ambulatory Visit | Attending: Cardiovascular Disease | Admitting: Cardiovascular Disease

## 2016-12-17 ENCOUNTER — Encounter (HOSPITAL_COMMUNITY): Payer: Self-pay | Admitting: *Deleted

## 2016-12-17 DIAGNOSIS — E119 Type 2 diabetes mellitus without complications: Secondary | ICD-10-CM | POA: Insufficient documentation

## 2016-12-17 DIAGNOSIS — R079 Chest pain, unspecified: Secondary | ICD-10-CM | POA: Diagnosis present

## 2016-12-17 DIAGNOSIS — I25110 Atherosclerotic heart disease of native coronary artery with unstable angina pectoris: Secondary | ICD-10-CM | POA: Diagnosis not present

## 2016-12-17 DIAGNOSIS — I25810 Atherosclerosis of coronary artery bypass graft(s) without angina pectoris: Secondary | ICD-10-CM | POA: Insufficient documentation

## 2016-12-17 DIAGNOSIS — Z7982 Long term (current) use of aspirin: Secondary | ICD-10-CM | POA: Insufficient documentation

## 2016-12-17 DIAGNOSIS — Z79899 Other long term (current) drug therapy: Secondary | ICD-10-CM | POA: Diagnosis not present

## 2016-12-17 DIAGNOSIS — Z955 Presence of coronary angioplasty implant and graft: Secondary | ICD-10-CM

## 2016-12-17 DIAGNOSIS — Z951 Presence of aortocoronary bypass graft: Secondary | ICD-10-CM

## 2016-12-17 DIAGNOSIS — Z794 Long term (current) use of insulin: Secondary | ICD-10-CM | POA: Insufficient documentation

## 2016-12-17 HISTORY — PX: CORONARY STENT INTERVENTION: CATH118234

## 2016-12-17 HISTORY — PX: LEFT HEART CATH AND CORS/GRAFTS ANGIOGRAPHY: CATH118250

## 2016-12-17 LAB — GLUCOSE, CAPILLARY
GLUCOSE-CAPILLARY: 137 mg/dL — AB (ref 65–99)
GLUCOSE-CAPILLARY: 115 mg/dL — AB (ref 65–99)
Glucose-Capillary: 188 mg/dL — ABNORMAL HIGH (ref 65–99)
Glucose-Capillary: 85 mg/dL (ref 65–99)
Glucose-Capillary: 134 mg/dL — ABNORMAL HIGH (ref 65–99)

## 2016-12-17 LAB — POCT ACTIVATED CLOTTING TIME: Activated Clotting Time: 362 seconds

## 2016-12-17 SURGERY — LEFT HEART CATH AND CORS/GRAFTS ANGIOGRAPHY
Anesthesia: LOCAL

## 2016-12-17 MED ORDER — RAMIPRIL 5 MG PO CAPS
5.0000 mg | ORAL_CAPSULE | Freq: Every day | ORAL | Status: DC
  Administered 2016-12-18: 5 mg via ORAL
  Filled 2016-12-17: qty 1

## 2016-12-17 MED ORDER — BIVALIRUDIN TRIFLUOROACETATE 250 MG IV SOLR
INTRAVENOUS | Status: AC
  Filled 2016-12-17: qty 250

## 2016-12-17 MED ORDER — CLOPIDOGREL BISULFATE 300 MG PO TABS
ORAL_TABLET | ORAL | Status: AC
  Filled 2016-12-17: qty 1

## 2016-12-17 MED ORDER — MIDAZOLAM HCL 2 MG/2ML IJ SOLN
INTRAMUSCULAR | Status: DC | PRN
  Administered 2016-12-17: 1 mg via INTRAVENOUS

## 2016-12-17 MED ORDER — FENTANYL CITRATE (PF) 100 MCG/2ML IJ SOLN
INTRAMUSCULAR | Status: DC | PRN
  Administered 2016-12-17: 25 ug via INTRAVENOUS

## 2016-12-17 MED ORDER — SODIUM CHLORIDE 0.9 % WEIGHT BASED INFUSION: 1.0000 mL/kg/h | INTRAVENOUS | Status: DC

## 2016-12-17 MED ORDER — MIDAZOLAM HCL 2 MG/2ML IJ SOLN
INTRAMUSCULAR | Status: AC
  Filled 2016-12-17: qty 2

## 2016-12-17 MED ORDER — ISOSORBIDE MONONITRATE ER 30 MG PO TB24
15.0000 mg | ORAL_TABLET | Freq: Every day | ORAL | Status: DC
  Administered 2016-12-17 – 2016-12-18 (×2): 15 mg via ORAL
  Filled 2016-12-17 (×2): qty 1

## 2016-12-17 MED ORDER — ACETAMINOPHEN 325 MG PO TABS
650.0000 mg | ORAL_TABLET | ORAL | Status: DC | PRN
  Administered 2016-12-17: 650 mg via ORAL
  Filled 2016-12-17: qty 2

## 2016-12-17 MED ORDER — ATROPINE SULFATE 1 MG/10ML IJ SOSY
PREFILLED_SYRINGE | INTRAMUSCULAR | Status: AC
  Filled 2016-12-17: qty 10

## 2016-12-17 MED ORDER — SODIUM CHLORIDE 0.9% FLUSH: 3.0000 mL | INTRAVENOUS | Status: DC | PRN

## 2016-12-17 MED ORDER — FENOFIBRATE 160 MG PO TABS
160.0000 mg | ORAL_TABLET | Freq: Every day | ORAL | Status: DC
  Administered 2016-12-18: 160 mg via ORAL
  Filled 2016-12-17: qty 1

## 2016-12-17 MED ORDER — ASPIRIN 81 MG PO CHEW
81.0000 mg | CHEWABLE_TABLET | Freq: Every day | ORAL | Status: DC
  Administered 2016-12-18: 81 mg via ORAL
  Filled 2016-12-17: qty 1

## 2016-12-17 MED ORDER — CLOPIDOGREL BISULFATE 300 MG PO TABS
ORAL_TABLET | ORAL | Status: DC | PRN
  Administered 2016-12-17: 600 mg via ORAL

## 2016-12-17 MED ORDER — ASPIRIN 81 MG PO CHEW
CHEWABLE_TABLET | ORAL | Status: AC
  Administered 2016-12-17: 81 mg via ORAL
  Filled 2016-12-17: qty 1

## 2016-12-17 MED ORDER — HEPARIN (PORCINE) IN NACL 2-0.9 UNIT/ML-% IJ SOLN
INTRAMUSCULAR | Status: AC | PRN
  Administered 2016-12-17: 1000 mL

## 2016-12-17 MED ORDER — FUROSEMIDE 40 MG PO TABS
40.0000 mg | ORAL_TABLET | Freq: Every day | ORAL | Status: DC
  Administered 2016-12-18: 40 mg via ORAL
  Filled 2016-12-17: qty 1

## 2016-12-17 MED ORDER — VERAPAMIL HCL 2.5 MG/ML IV SOLN
INTRAVENOUS | Status: AC
  Filled 2016-12-17: qty 2

## 2016-12-17 MED ORDER — ASPIRIN 81 MG PO CHEW
81.0000 mg | CHEWABLE_TABLET | ORAL | Status: AC
Start: 2016-12-17 — End: 2016-12-17
  Administered 2016-12-17: 81 mg via ORAL

## 2016-12-17 MED ORDER — BIVALIRUDIN BOLUS VIA INFUSION - CUPID
INTRAVENOUS | Status: DC | PRN
Start: 2016-12-17 — End: 2016-12-17
  Administered 2016-12-17: 81.675 mg via INTRAVENOUS

## 2016-12-17 MED ORDER — SIMVASTATIN 20 MG PO TABS: 10.0000 mg | ORAL_TABLET | Freq: Every day | ORAL | Status: DC

## 2016-12-17 MED ORDER — INSULIN PUMP
Freq: Three times a day (TID) | SUBCUTANEOUS | Status: DC
  Administered 2016-12-18: 08:00:00 via SUBCUTANEOUS
  Filled 2016-12-17: qty 1

## 2016-12-17 MED ORDER — FENTANYL CITRATE (PF) 100 MCG/2ML IJ SOLN
INTRAMUSCULAR | Status: AC
  Filled 2016-12-17: qty 2

## 2016-12-17 MED ORDER — HEPARIN (PORCINE) IN NACL 2-0.9 UNIT/ML-% IJ SOLN
INTRAMUSCULAR | Status: AC
  Filled 2016-12-17: qty 1000

## 2016-12-17 MED ORDER — ANGIOPLASTY BOOK
Freq: Once | Status: AC
  Administered 2016-12-17: 21:00:00
  Filled 2016-12-17: qty 1

## 2016-12-17 MED ORDER — SODIUM CHLORIDE 0.9 % IV SOLN
INTRAVENOUS | Status: AC
  Administered 2016-12-17: 17:00:00 via INTRAVENOUS

## 2016-12-17 MED ORDER — HYDRALAZINE HCL 20 MG/ML IJ SOLN: 5.0000 mg | INTRAMUSCULAR | Status: AC | PRN

## 2016-12-17 MED ORDER — LIDOCAINE HCL (PF) 1 % IJ SOLN
INTRAMUSCULAR | Status: DC | PRN
  Administered 2016-12-17: 20 mL

## 2016-12-17 MED ORDER — SODIUM CHLORIDE 0.9 % IV SOLN: 250.0000 mL | INTRAVENOUS | Status: DC | PRN

## 2016-12-17 MED ORDER — MORPHINE SULFATE (PF) 4 MG/ML IV SOLN: 2.0000 mg | INTRAVENOUS | Status: DC | PRN

## 2016-12-17 MED ORDER — CLOPIDOGREL BISULFATE 75 MG PO TABS
75.0000 mg | ORAL_TABLET | Freq: Every day | ORAL | Status: DC
  Administered 2016-12-18: 75 mg via ORAL
  Filled 2016-12-17: qty 1

## 2016-12-17 MED ORDER — SODIUM CHLORIDE 0.9 % WEIGHT BASED INFUSION
3.0000 mL/kg/h | INTRAVENOUS | Status: DC
Start: 2016-12-17 — End: 2016-12-17
  Administered 2016-12-17: 3 mL/kg/h via INTRAVENOUS

## 2016-12-17 MED ORDER — SODIUM CHLORIDE 0.9 % IV SOLN
1.7500 mg/kg/h | INTRAVENOUS | Status: DC
  Administered 2016-12-17 (×3): 1.75 mg/kg/h via INTRAVENOUS
  Filled 2016-12-17 (×4): qty 250

## 2016-12-17 MED ORDER — SODIUM CHLORIDE 0.9 % IV SOLN
INTRAVENOUS | Status: AC | PRN
  Administered 2016-12-17 (×2): 1.75 mg/kg/h via INTRAVENOUS

## 2016-12-17 MED ORDER — SODIUM CHLORIDE 0.9% FLUSH
3.0000 mL | Freq: Two times a day (BID) | INTRAVENOUS | Status: DC
  Administered 2016-12-17: 3 mL via INTRAVENOUS

## 2016-12-17 MED ORDER — LABETALOL HCL 5 MG/ML IV SOLN: 10.0000 mg | INTRAVENOUS | Status: AC | PRN

## 2016-12-17 MED ORDER — IOPAMIDOL (ISOVUE-370) INJECTION 76%
INTRAVENOUS | Status: AC
  Filled 2016-12-17: qty 100

## 2016-12-17 MED ORDER — ONDANSETRON HCL 4 MG/2ML IJ SOLN: 4.0000 mg | Freq: Four times a day (QID) | INTRAMUSCULAR | Status: DC | PRN

## 2016-12-17 MED ORDER — NITROGLYCERIN 1 MG/10 ML FOR IR/CATH LAB
INTRA_ARTERIAL | Status: AC
  Filled 2016-12-17: qty 10

## 2016-12-17 MED ORDER — IOPAMIDOL (ISOVUE-370) INJECTION 76%
INTRAVENOUS | Status: DC | PRN
  Administered 2016-12-17: 190 mL via INTRA_ARTERIAL

## 2016-12-17 MED ORDER — NITROGLYCERIN 1 MG/10 ML FOR IR/CATH LAB
INTRA_ARTERIAL | Status: DC | PRN
  Administered 2016-12-17: 200 ug via INTRACORONARY

## 2016-12-17 MED ORDER — METOPROLOL SUCCINATE ER 50 MG PO TB24
50.0000 mg | ORAL_TABLET | Freq: Every day | ORAL | Status: DC
  Administered 2016-12-18: 50 mg via ORAL
  Filled 2016-12-17: qty 1

## 2016-12-17 SURGICAL SUPPLY — 21 items
BALLN EUPHORA RX 2.0X12 (BALLOONS) ×2
BALLOON EUPHORA RX 2.0X12 (BALLOONS) ×1 IMPLANT
CATH INFINITI 5 FR LCB (CATHETERS) ×2 IMPLANT
CATH INFINITI 5 FR RCB (CATHETERS) ×2 IMPLANT
CATH INFINITI 5FR MULTPACK ANG (CATHETERS) ×2 IMPLANT
CATHETER LAUNCHER 6FR RCB (CATHETERS) ×2 IMPLANT
DEVICE SPIDERFX EMB PROT 5MM (WIRE) ×2 IMPLANT
HOVERMATT SINGLE USE (MISCELLANEOUS) ×2 IMPLANT
KIT ENCORE 26 ADVANTAGE (KITS) ×2 IMPLANT
KIT HEART LEFT (KITS) ×2 IMPLANT
PACK CARDIAC CATHETERIZATION (CUSTOM PROCEDURE TRAY) ×2 IMPLANT
SHEATH PINNACLE 5F 10CM (SHEATH) ×2 IMPLANT
SHEATH PINNACLE 6F 10CM (SHEATH) ×2 IMPLANT
STENT SYNERGY DES 2.5X12 (Permanent Stent) ×2 IMPLANT
STENT SYNERGY DES 3.5X20 (Permanent Stent) ×2 IMPLANT
STENT SYNERGY DES 3X38 (Permanent Stent) ×2 IMPLANT
SYR MEDRAD MARK V 150ML (SYRINGE) ×2 IMPLANT
TRANSDUCER W/STOPCOCK (MISCELLANEOUS) ×2 IMPLANT
TUBING CIL FLEX 10 FLL-RA (TUBING) ×2 IMPLANT
WIRE ASAHI PROWATER 180CM (WIRE) ×2 IMPLANT
WIRE EMERALD 3MM-J .035X150CM (WIRE) ×2 IMPLANT

## 2016-12-17 NOTE — Interval H&P Note (Signed)
Cath Lab Visit (complete for each Cath Lab visit)  Clinical Evaluation Leading to the Procedure:   ACS: No.  Non-ACS:    Anginal Classification: CCS II  Anti-ischemic medical therapy: Maximal Therapy (2 or more classes of medications)  Non-Invasive Test Results: No non-invasive testing performed  Prior CABG: Previous CABG      History and Physical Interval Note:  12/17/2016 11:57 AM  Juan [name]  has presented today for surgery, with the diagnosis of cp  The various methods of treatment have been discussed with the patient and family. After consideration of risks, benefits and other options for treatment, the patient has consented to  Procedure(s): LEFT HEART CATH AND CORS/GRAFTS ANGIOGRAPHY (N/A) as a surgical intervention .  The patient's history has been reviewed, patient examined, no change in status, stable for surgery.  I have reviewed the patient's chart and labs.  Questions were answered to the patient's satisfaction.     Quay Burow

## 2016-12-17 NOTE — H&P (View-Only) (Signed)
12/14/2016 Juan [name]   [birth year]  [id number]  Primary Physician System, Pcp Not In Primary Cardiologist: Lorretta Harp MD FACP, [place], [place], [place]  HPI:  Juan [name] is a 51 y.o. male mild to moderately overweight, married Caucasian male, father of 1 child who I last saw in the office 03/23/16. He has seen Dr. Sallyanne Kuster twice in 2011. He has a long history of insulin-dependent diabetes as well as extremely high hypertriglyceridemia in the 3000-6000 range with multiple episodes of pancreatitis. He had negative venous Dopplers for DVTs. He does wear compression stockings. He has complained of left inframammary chest pain and increasing shortness of breath. He is not aware of his family history since he was adopted. He had a CT scan of his chest and abdomen which showed a mass at the tail of his pancreas but also showed severe calcification in the LAD. He did have a Myoview stress test performed 2 years ago that was nonischemic. At that time, he had no symptoms of chest pain or shortness of breath. I referred him to Dr. Lavetta Nielsen at [facility] for more intense treatment of his severe hypertriglyceridemia.since I saw him last in December of last several weeks he developed exertional chest pain and shortness of breath.I was concerned that he has developed progression of disease given all of his risk factors.  He had a Myoview stress test performed that showed inferior ischemia which is high risk. He continued to have exertional chest pain with left upper irradiation.I performed cardiac catheterization on him 02/03/13 revealing three-vessel disease and preserved LV function. He subsequently underwent coronary artery bypass bypass grafting x4 by Dr. Tharon Aquas Trigt with a LIMA to his LAD, a left radial to obtuse marginal branch, vein graft to an diagonal branch and PDA. His postop course was uncomplicated. He completed 7 weeks her cardiac rehabilitation and is back to work. Since  I saw him in the office 4 months ago he has seen Kerin Ransom back who ordered a 2-D echo that showed normal LV systolic function, grade 2 diastolic dysfunction and a Myoview stress test that was normal as well. Since I saw him in the office back in January he started some increasing dyspnea on exertion over the last 6 months and new onset substernal chest pressure which is primarily exertional in nature with left upper extremity radiation over the last week. In addition, he had an episode of cellulitis in his right lower extremity a month ago treated with antibiotics which resulted in increased swelling which has not resolved.   Current Meds  Medication Sig  . aspirin EC 325 MG EC tablet Take 1 tablet (325 mg total) by mouth daily.  Marland Kitchen FARXIGA 5 MG TABS tablet Take 5 mg by mouth daily.  . fenofibrate 160 MG tablet Take 160 mg by mouth daily. PM  . furosemide (LASIX) 20 MG tablet Take 1 tablet by mouth daily.  . Insulin Human (INSULIN PUMP) SOLN Inject into the skin.  Marland Kitchen insulin regular human CONCENTRATED (HUMULIN R) 500 UNIT/ML SOLN injection Inject into the skin.   Marland Kitchen KLOR-CON M10 10 MEQ tablet TAKE 1 TABLET BY MOUTH ONCE DAILY WITH FOOD WITH USE OF FLUID  . metFORMIN (GLUCOPHAGE) 1000 MG tablet Take 1,000 mg by mouth 2 (two) times daily with a meal.  . metoprolol succinate (TOPROL-XL) 50 MG 24 hr tablet TAKE 1 TABLET EVERY DAY WITH OR IMMEDIATELY  . Omega-3 Fatty Acids (FISH OIL) 1000 MG CAPS  Take 2,000-3,000 mg by mouth daily.  . ramipril (ALTACE) 5 MG capsule Take 5 mg by mouth daily.  . simvastatin (ZOCOR) 10 MG tablet TAKE 1 TABLET(10 MG) BY MOUTH DAILY     No Known Allergies  Social History   Social History  . Marital status: Married    Spouse name: [name]  . Number of children: 1  . Years of education: 23   Occupational History  . 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 Smoking status: Never Smoker  . Smokeless tobacco: Never Used  .  Alcohol use No  . Drug use: No  . Sexual activity: Yes   Other Topics Concern  . Not on file   Social History Narrative   Adopted, so no pertinent FH.  Married.  Lives with wife in [place].  Independent of ADLs and ambulation.     Review of Systems: General: negative for chills, fever, night sweats or weight changes.  Cardiovascular: negative for chest pain, dyspnea on exertion, edema, orthopnea, palpitations, paroxysmal nocturnal dyspnea or shortness of breath Dermatological: negative for rash Respiratory: negative for cough or wheezing Urologic: negative for hematuria Abdominal: negative for nausea, vomiting, diarrhea, bright red blood per rectum, melena, or hematemesis Neurologic: negative for visual changes, syncope, or dizziness All other systems reviewed and are otherwise negative except as noted above.    Blood pressure 134/80, pulse 88, height 5\' 11"  (1.803 m), weight 243 lb (110.2 kg).  General appearance: alert and no distress Neck: no adenopathy, no carotid bruit, no JVD, supple, symmetrical, trachea midline and thyroid not enlarged, symmetric, no tenderness/mass/nodules Lungs: clear to auscultation bilaterally Heart: regular rate and rhythm, S1, S2 normal, no murmur, click, rub or gallop Extremities: 2+ edema on the left, 2-3+ on the right. There is asymmetry of his legs. Pulses: 2+ and symmetric Skin: Skin color, texture, turgor normal. No rashes or lesions or Venous stasis changes bilaterally Neurologic: Alert and oriented X 3, normal strength and tone. Normal symmetric reflexes. Normal coordination and gait  EKG sinus rhythm at 88 with lateral T-wave inversion. I personally reviewed this EKG.  ASSESSMENT AND PLAN:   Dyslipidemia History of hyperlipidemia on fenofibrate and simvastatin followed by his PCP  S/P CABG x 4 History of coronary artery disease status post bypass grafting 4 by Dr. Tharon Aquas Trigt 02/05/13 with a LIMA to the LAD, left radial to  obtuse marginal branch, vein graft to diagonal branch and PDA. Postop course was uncomplicated. He had a 2-D echo performed 03/01/16 which was entirely normal and a Myoview stress test performed at the same time which with slow risk and nonischemic. He's noticed increasing dyspnea on exertion over the last several months and new onset substernal chest pain which is exertional in nature and which radiated to his left upper extremity over the last week. I'm going to add low-dose Imdur 15 mg a day and arrange for him to undergo chronic catheterization by myself on Monday.  Edema of both legs History of chronic lower extremity edema thought to be related to venous insufficiency with worsening edema and tenseness of his right calf which began a month ago during an episode of cellulitis. I'm going to get a venous Doppler study to rule out DVT today.      Lorretta Harp MD FACP,FACC,FAHA, [facility] 12/14/2016 2:58 PM

## 2016-12-17 NOTE — Progress Notes (Signed)
Site area: right groin  Site Prior to Removal:  Level 0  Pressure Applied For 20 MINUTES    Minutes Beginning at 2040  Manual:   Yes.    Patient Status During Pull:  stable  Post Pull Groin Site:  Level 0  Post Pull Instructions Given:  Yes.    Post Pull Pulses Present:  Yes.    Dressing Applied:  Yes.    Comments:  Groin site teaching reinforced. Pt tolerated well. Verbalizes and demonstrates understanding of restrictions. Six hour bedrest ending at 0300.  Observed by Helayne Seminole RN.

## 2016-12-17 NOTE — Care Management Note (Signed)
Case Management Note  Patient Details  Name: Juan [name] MRN: [id number] Date of Birth: April 26, 1965  Subjective/Objective:  From home, s/p coronary stent intervention, will be on plavix. Plan for dc tomorrow.                  Action/Plan: NCM will follow for dc needs.   Expected Discharge Date:                  Expected Discharge Plan:  Home/Self Care  In-House Referral:     Discharge planning Services  CM Consult  Post Acute Care Choice:    Choice offered to:     DME Arranged:    DME Agency:     HH Arranged:    [place] Agency:     Status of Service:  Completed, signed off  If discussed at [employer] of Stay Meetings, dates discussed:    Additional Comments:  Zenon Mayo, RN 12/17/2016, 3:46 PM

## 2016-12-18 ENCOUNTER — Encounter (HOSPITAL_COMMUNITY): Payer: Self-pay | Admitting: Cardiovascular Disease

## 2016-12-18 DIAGNOSIS — I25810 Atherosclerosis of coronary artery bypass graft(s) without angina pectoris: Secondary | ICD-10-CM

## 2016-12-18 DIAGNOSIS — E782 Mixed hyperlipidemia: Secondary | ICD-10-CM | POA: Diagnosis not present

## 2016-12-18 DIAGNOSIS — I25700 Atherosclerosis of coronary artery bypass graft(s), unspecified, with unstable angina pectoris: Secondary | ICD-10-CM

## 2016-12-18 DIAGNOSIS — I200 Unstable angina: Secondary | ICD-10-CM | POA: Diagnosis not present

## 2016-12-18 DIAGNOSIS — I25110 Atherosclerotic heart disease of native coronary artery with unstable angina pectoris: Secondary | ICD-10-CM | POA: Diagnosis not present

## 2016-12-18 DIAGNOSIS — Z794 Long term (current) use of insulin: Secondary | ICD-10-CM | POA: Diagnosis not present

## 2016-12-18 DIAGNOSIS — E118 Type 2 diabetes mellitus with unspecified complications: Secondary | ICD-10-CM

## 2016-12-18 DIAGNOSIS — E119 Type 2 diabetes mellitus without complications: Secondary | ICD-10-CM | POA: Diagnosis not present

## 2016-12-18 DIAGNOSIS — Z951 Presence of aortocoronary bypass graft: Secondary | ICD-10-CM | POA: Diagnosis not present

## 2016-12-18 LAB — GLUCOSE, CAPILLARY: Glucose-Capillary: 73 mg/dL (ref 65–99)

## 2016-12-18 LAB — CBC
HCT: 36.5 % — ABNORMAL LOW (ref 39.0–52.0)
HEMOGLOBIN: 12.2 g/dL — AB (ref 13.0–17.0)
MCH: 26.2 pg (ref 26.0–34.0)
MCHC: 33.4 g/dL (ref 30.0–36.0)
MCV: 78.5 fL (ref 78.0–100.0)
Platelets: 118 10*3/uL — ABNORMAL LOW (ref 150–400)
RBC: 4.65 MIL/uL (ref 4.22–5.81)
RDW: 17 % — ABNORMAL HIGH (ref 11.5–15.5)
WBC: 7.5 10*3/uL (ref 4.0–10.5)

## 2016-12-18 LAB — BASIC METABOLIC PANEL
ANION GAP: 6 (ref 5–15)
BUN: 21 mg/dL — ABNORMAL HIGH (ref 6–20)
CHLORIDE: 103 mmol/L (ref 101–111)
CO2: 25 mmol/L (ref 22–32)
CREATININE: 0.91 mg/dL (ref 0.61–1.24)
Calcium: 9.8 mg/dL (ref 8.9–10.3)
GFR calc non Af Amer: >60 mL/min (ref >60)
Glucose, Bld: 108 mg/dL — ABNORMAL HIGH (ref 65–99)
Potassium: 4.1 mmol/L (ref 3.5–5.1)
Sodium: 134 mmol/L — ABNORMAL LOW (ref 135–145)

## 2016-12-18 MED ORDER — CLOPIDOGREL BISULFATE 75 MG PO TABS: 75.0000 mg | ORAL_TABLET | Freq: Every day | ORAL | 1 refills | Status: DC

## 2016-12-18 MED ORDER — NITROGLYCERIN 0.4 MG SL SUBL
0.4000 mg | SUBLINGUAL_TABLET | SUBLINGUAL | 2 refills | Status: DC | PRN
Start: 2016-12-18 — End: 2019-06-18

## 2016-12-18 MED ORDER — ASPIRIN 81 MG PO CHEW: 81.0000 mg | CHEWABLE_TABLET | Freq: Every day | ORAL | Status: AC

## 2016-12-18 MED FILL — Verapamil HCl IV Soln 2.5 MG/ML: INTRAVENOUS | Qty: 2 | Status: AC

## 2016-12-18 NOTE — Progress Notes (Signed)
CARDIAC REHAB PHASE I   PRE:  Rate/Rhythm: 95 SR  BP:  Supine:   Sitting: 163/92  Standing:    SaO2:   MODE:  Ambulation: 600 ft   POST:  Rate/Rhythm: 102 ST  BP:  Supine:   Sitting: 161/86  Standing:    SaO2:  1224-8250 Pt walked 600 ft with steady gait and no CP. Education completed with pt who voiced understanding. Stressed importance of plavix with stent. Reviewed NTG use, ex ed and will refer to [facility] CRP 2. Pt attended after CABG but will probably not be able to attend with work schedule. He attended diet classes then and he sees Dr Arther Dames for triglycerides at [facility].  Pt is trying to keep his diabetes as well controlled as he can. He is walking for ex.   Graylon Good, RN BSN  12/18/2016 9:48 AM

## 2016-12-18 NOTE — Discharge Summary (Addendum)
Discharge Summary    Patient ID: Juan [name],  MRN: [id number], DOB/AGE: 05/12/1965 51 y.o.  Admit date: 12/17/2016 Discharge date: 12/18/2016  Primary Care Provider: System, Pcp Not In Primary Cardiologist: Juan [name]   Discharge Diagnoses    Active Problems:   S/P CABG x 4   Chest pain   Allergies No Known Allergies  Diagnostic Studies/Procedures    Cath: 12/17/16  Conclusion     Prox RCA to Mid RCA lesion, 80 %stenosed.  Mid RCA lesion, 80 %stenosed.  Dist RCA lesion, 95 %stenosed.  Prox Cx lesion, 95 %stenosed.  Ost 1st Mrg to 1st Mrg lesion, 95 %stenosed.  Ost Cx to Prox Cx lesion, 70 %stenosed.  Mid Cx to Dist Cx lesion, 80 %stenosed.  Ost 1st Diag to 1st Diag lesion, 90 %stenosed.  Ost 2nd Diag to 2nd Diag lesion, 90 %stenosed.  Mid LAD to Dist LAD lesion, 90 %stenosed.  Dist LAD lesion, 100 %stenosed.  LIMA.  Left radial artery.  SVG.  Origin to Prox Graft lesion, 100 %stenosed.  SVG and is large and anatomically normal.  Mid Graft lesion, 80 %stenosed.  Prox Graft lesion, 80 %stenosed.  Post intervention, there is a 0% residual stenosis.  A stent was successfully placed.  Dist Graft to Insertion lesion, 95 %stenosed.  Post intervention, there is a 0% residual stenosis.  A stent was successfully placed.  The left ventricular systolic function is normal.  LV end diastolic pressure is normal.  The left ventricular ejection fraction is 50-55% by visual estimate.   Final impression: Successful PCI and drug-eluting stenting of the mid and distal RCA SVG in a diffusely diseased vein graft 50 years old. Distal protection was used. Patient had good flow into the case. This SVG to diagonal branch was occluded at the aorta and he does have mid circumflex high-grade stenosis as well as first obtuse marginal branch stenosis but this is a relatively small vessel and the left radial graft to the distal OM is patent providing retrograde  flow.  The guide catheter and wire were removed. Sheath was secured. Angiomax will continue for 4 additional hours of full dose. The patient was discharged on the morning on aspirin and Plavix and will see back in the office in 2-3 weeks. He left the lab in stable condition.   Juan [name], M.D., FACP, [facility], [place], [place]  Diagnostic Diagram       Post-Intervention Diagram       _____________   History of Present Illness     Juan [name] is a 51 y.o. male mild to moderately overweight, married Caucasian male. He has seen Juan [name] twice in 2011. He has a long history of insulin-dependent diabetes as well as extremely high hypertriglyceridemia in the 3000-6000 range with multiple episodes of pancreatitis. He had negative venous Dopplers for DVTs. He does wear compression stockings. He has complained of left inframammary chest pain and increasing shortness of breath. He was not aware of his family history since he was adopted. He had a CT scan of his chest and abdomen which showed a mass at the tail of his pancreas but also showed severe calcification in the LAD. He did have a Myoview stress test performed 2 years ago that was nonischemic. At that time, he had no symptoms of chest pain or shortness of breath. He was referred to Juan [name] at [facility] for more intense treatment of his severe hypertriglyceridemia. He had a Myoview  stress test performed that showed inferior ischemia which is high risk. He continued to have exertional chest pain with left upper irradiation. Cardiac catheterization on 02/03/13 revealing three-vessel disease and preserved LV function. He subsequently underwent coronary artery bypass bypass grafting x4 by Juan [name] with a LIMA to his LAD, a left radial to obtuse marginal branch, vein graft to an diagonal branch and PDA. His postop course was uncomplicated. He completed 7 weeks her cardiac rehabilitation. He was seen in the  office on 12/14/16 and reported some increasing dyspnea on exertion over the last 6 months and new onset substernal chest pressure which is primarily exertional in nature with left upper extremity radiation over the last week. Given his symptoms he was referred for outpatient cardiac cath.   Hospital Course   Underwent cardiac cath with Juan [name] noted above with 3/4 patent grafts with diffuse lesions in the SVG--RCA, treated with DESx3 to the m/dRCA. EF noted at 50-55% on LV gram. Post cath labs showed stable Cr 0.91 and Hgb 12.2. No residual chest pain. Plan for DAPT with ASA/plavix. He was continued on his home medications, with ASA reduced to 81mg . Did not adjust statin or fenofibrate this admission. Consider follow up labs at outpatient appt.   Juan [name] was seen by Juan [name]  and determined stable for discharge home. Follow up in the office has been arranged. Medications are listed below.   _____________  Discharge Vitals Blood pressure (!) 143/84, pulse 88, temperature 97.6 F (36.4 C), temperature source Oral, resp. rate 18, height 5\' 11"  (1.803 m), weight 240 lb (108.9 kg), SpO2 95 %.  Filed Weights   12/17/16 0917  Weight: 240 lb (108.9 kg)    Labs & Radiologic Studies    CBC  Recent Labs  12/18/16 0257  WBC 7.5  HGB 12.2*  HCT 36.5*  MCV 78.5  PLT 536*   Basic Metabolic Panel  Recent Labs  12/18/16 0257  NA 134*  K 4.1  CL 103  CO2 25  GLUCOSE 108*  BUN 21*  CREATININE 0.91  CALCIUM 9.8   Liver Function Tests No results for input(s): AST, ALT, ALKPHOS, BILITOT, PROT, ALBUMIN in the last 72 hours. No results for input(s): LIPASE, AMYLASE in the last 72 hours. Cardiac Enzymes No results for input(s): CKTOTAL, CKMB, CKMBINDEX, TROPONINI in the last 72 hours. BNP Invalid input(s): POCBNP D-Dimer No results for input(s): DDIMER in the last 72 hours. Hemoglobin A1C No results for input(s): HGBA1C in the last 72 hours. Fasting Lipid Panel No results  for input(s): CHOL, HDL, LDLCALC, TRIG, CHOLHDL, LDLDIRECT in the last 72 hours. Thyroid Function Tests No results for input(s): TSH, T4TOTAL, T3FREE, THYROIDAB in the last 72 hours.  Invalid input(s): FREET3 _____________  Dg Chest 2 View  Result Date: 12/14/2016 CLINICAL DATA:  Pre testing for cardiac cath, recent chest pains, past hx of CABG, past hx of NHL EXAM: CHEST  2 VIEW COMPARISON:  03/04/2013 FINDINGS: Status post median sternotomy. Heart size is normal. There is mild perihilar peribronchial thickening, stable in appearance. No focal consolidations or pleural effusions. No pulmonary edema. IMPRESSION: Stable bronchitic changes.  No focal acute pulmonary abnormality. Electronically Signed   By: Nolon Nations M.D.   On: 12/14/2016 16:58   Disposition   Pt is being discharged home today in good condition.  Follow-up Plans & Appointments    Follow-up Information    Erma Heritage, PA-C Follow up on 12/27/2016.   Specialties:  Physician  Assistant, Cardiology Why:  at 8:30am for your appt Contact information: [address] [place] [address] [phone]          Discharge Instructions    AMB Referral to Cardiac Rehabilitation - Phase II    Complete by:  As directed    Diagnosis:  Stable Angina   Amb Referral to Cardiac Rehabilitation    Complete by:  As directed    Diagnosis:  Coronary Stents   Call MD for:  redness, tenderness, or signs of infection (pain, swelling, redness, odor or green/yellow discharge around incision site)    Complete by:  As directed    Diet - low sodium heart healthy    Complete by:  As directed    Discharge instructions    Complete by:  As directed    Groin Site Care Refer to this sheet in the next few weeks. These instructions provide you with information on caring for yourself after your procedure. Your caregiver may also give you more specific instructions. Your treatment has been planned according to current  medical practices, but problems sometimes occur. Call your caregiver if you have any problems or questions after your procedure. HOME CARE INSTRUCTIONS You may shower 24 hours after the procedure. Remove the bandage (dressing) and gently wash the site with plain soap and water. Gently pat the site dry.  Do not apply powder or lotion to the site.  Do not sit in a bathtub, swimming pool, or whirlpool for 5 to 7 days.  No bending, squatting, or lifting anything over 10 pounds (4.5 kg) as directed by your caregiver.  Inspect the site at least twice daily.  Do not drive home if you are discharged the same day of the procedure. Have someone else drive you.  You may drive 24 hours after the procedure unless otherwise instructed by your caregiver.  What to expect: Any bruising will usually fade within 1 to 2 weeks.  Blood that collects in the tissue (hematoma) may be painful to the touch. It should usually decrease in size and tenderness within 1 to 2 weeks.  SEEK IMMEDIATE MEDICAL CARE IF: You have unusual pain at the groin site or down the affected leg.  You have redness, warmth, swelling, or pain at the groin site.  You have drainage (other than a small amount of blood on the dressing).  You have chills.  You have a fever or persistent symptoms for more than 72 hours.  You have a fever and your symptoms suddenly get worse.  Your leg becomes pale, cool, tingly, or numb.  You have heavy bleeding from the site. Hold pressure on the site. Marland Kitchen  PLEASE DO NOT MISS ANY DOSES OF YOUR PLAVIX!!!!! Also keep a log of you blood pressures and bring back to your follow up appt. Please call the office with any questions.   Patients taking blood thinners should generally stay away from medicines like ibuprofen, Advil, Motrin, naproxen, and Aleve due to risk of stomach bleeding. You may take Tylenol as directed or talk to your primary doctor about alternatives.   Increase activity slowly    Complete by:  As  directed       Discharge Medications     Medication List    STOP taking these medications   aspirin 325 MG EC tablet Replaced by:  aspirin 81 MG chewable tablet     TAKE these medications   aspirin 81 MG chewable tablet Chew 1 tablet (81 mg total) by  mouth daily. Replaces:  aspirin 325 MG EC tablet   betamethasone dipropionate 0.05 % cream Commonly known as:  DIPROLENE Apply 1 application topically 2 (two) times daily.   clindamycin 300 MG capsule Commonly known as:  CLEOCIN Take 300 mg by mouth 3 (three) times daily.   clopidogrel 75 MG tablet Commonly known as:  PLAVIX Take 1 tablet (75 mg total) by mouth daily with breakfast.   FARXIGA 5 MG Tabs tablet Generic drug:  dapagliflozin propanediol Take 5 mg by mouth daily.   fenofibrate 160 MG tablet Take 160 mg by mouth daily.   Fish Oil 1000 MG Caps Take 6,000 mg by mouth 2 (two) times daily.   furosemide 40 MG tablet Commonly known as:  LASIX Take 1 tablet (40 mg total) by mouth daily.   HUMULIN R 500 UNIT/ML injection Generic drug:  insulin regular human CONCENTRATED Inject into the skin. Using insulin pump   insulin pump Soln Inject into the skin.   isosorbide mononitrate 30 MG 24 hr tablet Commonly known as:  IMDUR Take 0.5 tablets (15 mg total) by mouth daily.   metFORMIN 1000 MG tablet Commonly known as:  GLUCOPHAGE Take 1,000 mg by mouth 2 (two) times daily with a meal.   metoprolol succinate 50 MG 24 hr tablet Commonly known as:  TOPROL-XL TAKE 1 TABLET EVERY DAY WITH OR IMMEDIATELY   nitroGLYCERIN 0.4 MG SL tablet Commonly known as:  NITROSTAT Place 1 tablet (0.4 mg total) under the tongue every 5 (five) minutes as needed.   ramipril 5 MG capsule Commonly known as:  ALTACE Take 5 mg by mouth daily.   simvastatin 10 MG tablet Commonly known as:  ZOCOR TAKE 1 TABLET(10 MG) BY MOUTH DAILY        Aspirin prescribed at discharge?  Yes High Intensity Statin Prescribed? (Lipitor  40-80mg  or Crestor 20-40mg ): No: Consider adjusting as outpatient if appropriate Beta Blocker Prescribed? Yes For EF <40%, was ACEI/ARB Prescribed? Yes ADP Receptor Inhibitor Prescribed? (i.e. Plavix etc.-Includes Medically Managed Patients): Yes For EF <40%, Aldosterone Inhibitor Prescribed? No: EF ok Was EF assessed during THIS hospitalization? Yes Was Cardiac Rehab II ordered? (Included Medically managed Patients): Yes   Outstanding Labs/Studies   Consider follow up LFT/FLP at outpatient visit.   Duration of Discharge Encounter   Greater than 30 minutes including physician time.  Signed, Reino Bellis NP-C 12/18/2016, 9:57 AM   I have examined the patient and reviewed assessment and plan and discussed with patient.  Agree with above as stated.  Right groin stable.  RRR, clear to ausculation bilaterally. Palpable 2+ right PT pulse.    Stressed importance of DAPT.  He will be out of work for 2 weeks until he is seen in the clinic.   DM control.  Lipid lowering therapy.  He will f/u with Juan [name].  Larae Grooms

## 2016-12-24 ENCOUNTER — Telehealth (HOSPITAL_COMMUNITY): Payer: Self-pay

## 2016-12-24 NOTE — Telephone Encounter (Signed)
I called patient to discuss cardiac rehab. Patient CRP card stated that patient would probably not be able to participate due to work schedule. I called patient to confirm, patient is indeed getting ready to return to work in a week and our times does not work with his work schedule. Patient is interested in cardiac rehab, but work schedule does not allow. Referral closed.

## 2016-12-26 NOTE — Progress Notes (Signed)
Cardiology Office Note    Date:  12/28/2016   ID:  Juan [name], DOB 1965/07/18, MRN [id number]  PCP:  System, Pcp Not In  Cardiologist:  Dr. Gwenlyn Found   Chief Complaint  Patient presents with  . Follow-up    recent catheterization    History of Present Illness:    Juan [name] is a 51 y.o. male with past medical history of CAD (s/p CABG in 2014 with LIMA-LAD, RI-OM, SVG-D1, and SVG-PDA), HTN, HLD, and IDDM who presents to the office today for follow-up from his recent cardiac catheterization.   He was examined by Dr. Gwenlyn Found in 11/2016 and reported having worsening dyspnea on exertion for the past 6 months along with substernal chest pressure. He was therefore started on Imdur 15mg  daily and was scheduled to have a cardiac catheterization. This was performed on 12/17/2016 and showed severe native CAD with patent LIMA-LAD, patent RI-OM,  significant 80-95% stenosis along the SVG-RCA and occluded SVG-D1. Successful PCI was performed with placement of 3 DES to SVG-RCA. The SVG to diagonal branch was occluded at the aorta and he did have mid-circumflex high-grade stenosis as well as first obtuse marginal branch stenosis but this was a relatively small vessel and the left radial graft to the distal OM was patent, providing retrograde flow. He was continued on DAPT with ASA and Plavix. Hgb and creatinine were stable the morning following the procedure and he was discharged home.   In talking with the patient today, he reports doing well since his recent hospitalization. He denies any repeat episodes of chest discomfort and reports breathing is at baseline. He does note a tightness along his sternal region which he reports as been present since bypass. No recent orthopnea, PND, or palpitations. Has chronic lower extremity edema which is worse along his right leg. No complications regarding his groin cath site.   He reports good compliance with his medication regimen including Aspirin and Plavix. He  denies missing any recent doses. He has not checked his blood pressure regularly at home but it is well-controlled at 126/54 during today's visit.   Past Medical History:  Diagnosis Date  . Abnormal cardiovascular stress test 02/06/2013  . Anemia    occasional - no problems currently(10/02/2011)  . Bilateral renal cysts 06/16/2011   states no known problems  . Blood transfusion without reported diagnosis   . CAD (coronary artery disease), LAD 90%, 1st diag 95%, LCX 70% wth AV groove 90%, RCA 40-50% mid and 80% long distal stenosis 02/03/13 02/04/2013  . Chest pain    positive Myoview stress test  . Coronary artery calcification seen on CAT scan   . Diabetes mellitus    IDDM  . Fatty liver disease, nonalcoholic 1/32/4401  . Hyperlipidemia   . Hypertension    states no dx. of HTN, takes med. to protect kidneys due to DM  . Lateral meniscus tear 09/2011   left  . Loose body in knee 09/2011   loose bodies left knee  . Non Hodgkin's lymphoma ([place]) 1991  . Pancreatitis    occasional - last episode 06/2011  . S/P CABG x 4, 02/05/13 LIMA-LAD; LT. RADIAL-OM;VG-DIAG; VG-PDA 02/06/2013   10/18 3/4 patent grafts (occluded SVG-->Diag), PCI/DESx 3 SVG-->RCA, normal EF  . Splenomegaly, congestive, chronic   . Stuffy and runny nose 10/02/2011   yellow drainage from nose    Past Surgical History:  Procedure Laterality Date  . CORONARY ARTERY BYPASS GRAFT N/A 02/05/2013   Procedure: CORONARY ARTERY  BYPASS GRAFTING (CABG);  Surgeon: Ivin Poot, MD;  Location: [facility];  Service: Open Heart Surgery;  Laterality: N/A;  Coronary artery bypass graft on pump times four using left internal mammary artery and right greater saphenous vein via endovein harvest and left radial artery harvest.   . CORONARY STENT INTERVENTION  12/17/2016    PCI and drug-eluting stenting of the mid and distal RCA SVG   . CORONARY STENT INTERVENTION N/A 12/17/2016   Procedure: CORONARY STENT INTERVENTION;  Surgeon: Lorretta Harp, MD;  Location: [facility];  Service: Cardiovascular;  Laterality: N/A;  . ELBOW SURGERY    . HERNIA REPAIR     inguinal   . INTRAOPERATIVE TRANSESOPHAGEAL ECHOCARDIOGRAM N/A 02/05/2013   Procedure: INTRAOPERATIVE TRANSESOPHAGEAL ECHOCARDIOGRAM;  Surgeon: Ivin Poot, MD;  Location: [facility];  Service: Open Heart Surgery;  Laterality: N/A;  . KNEE ARTHROSCOPY  10/09/2011   Procedure: ARTHROSCOPY KNEE;  Surgeon: Nita Sells, MD;  Location: [facility];  Service: Orthopedics;  Laterality: Left;  . LEFT HEART CATH AND CORS/GRAFTS ANGIOGRAPHY N/A 12/17/2016   Procedure: LEFT HEART CATH AND CORS/GRAFTS ANGIOGRAPHY;  Surgeon: Lorretta Harp, MD;  Location: [facility];  Service: Cardiovascular;  Laterality: N/A;  . LEFT HEART CATHETERIZATION WITH CORONARY ANGIOGRAM N/A 02/03/2013   Procedure: LEFT HEART CATHETERIZATION WITH CORONARY ANGIOGRAM;  Surgeon: Lorretta Harp, MD;  Location: [facility];  Service: Cardiovascular;  Laterality: N/A;  . NASAL SEPTUM SURGERY    . RADIAL ARTERY HARVEST Left 02/05/2013   Procedure: RADIAL ARTERY HARVEST;  Surgeon: Ivin Poot, MD;  Location: [facility];  Service: Vascular;  Laterality: Left;  . TIBIA BONE BIOPSY  x 3   left  . TONSILLECTOMY      Current Medications: Outpatient Medications Prior to Visit  Medication Sig Dispense Refill  . aspirin 81 MG chewable tablet Chew 1 tablet (81 mg total) by mouth daily.    . betamethasone dipropionate (DIPROLENE) 0.05 % cream Apply 1 application topically 2 (two) times daily.    . clindamycin (CLEOCIN) 300 MG capsule Take 300 mg by mouth 3 (three) times daily.    Marland Kitchen FARXIGA 5 MG TABS tablet Take 5 mg by mouth daily.  11  . fenofibrate 160 MG tablet Take 160 mg by mouth daily.     . furosemide (LASIX) 40 MG tablet Take 1 tablet (40 mg total) by mouth daily. 30 tablet 6  . Insulin Human (INSULIN PUMP) SOLN Inject into the skin.    Marland Kitchen insulin regular human  CONCENTRATED (HUMULIN R) 500 UNIT/ML SOLN injection Inject into the skin. Using insulin pump    . metFORMIN (GLUCOPHAGE) 1000 MG tablet Take 1,000 mg by mouth 2 (two) times daily with a meal.    . metoprolol succinate (TOPROL-XL) 50 MG 24 hr tablet TAKE 1 TABLET EVERY DAY WITH OR IMMEDIATELY 90 tablet 3  . nitroGLYCERIN (NITROSTAT) 0.4 MG SL tablet Place 1 tablet (0.4 mg total) under the tongue every 5 (five) minutes as needed. 25 tablet 2  . Omega-3 Fatty Acids (FISH OIL) 1000 MG CAPS Take 6,000 mg by mouth 2 (two) times daily.     . ramipril (ALTACE) 5 MG capsule Take 5 mg by mouth daily.  4  . simvastatin (ZOCOR) 10 MG tablet TAKE 1 TABLET(10 MG) BY MOUTH DAILY 90 tablet 3  . clopidogrel (PLAVIX) 75 MG tablet Take 1 tablet (75 mg total) by mouth daily with breakfast. 90 tablet 1  .  isosorbide mononitrate (IMDUR) 30 MG 24 hr tablet Take 0.5 tablets (15 mg total) by mouth daily. 45 tablet 1   No facility-administered medications prior to visit.      Allergies:   Patient has no known allergies.   Social History   Social History  . Marital status: Married    Spouse name: [name]  . Number of children: 1  . Years of education: 74   Occupational History  . 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 Smoking status: Never Smoker  . Smokeless tobacco: Never Used  . Alcohol use No  . Drug use: No  . Sexual activity: Yes   Other Topics Concern  . None   Social History Narrative   Adopted, so no pertinent FH.  Married.  Lives with wife in [place].  Independent of ADLs and ambulation.     Family History:  The patient's family history is not on file. He was adopted.   Review of Systems:   Please see the history of present illness.     General:  No chills, fever, night sweats or weight changes.  Cardiovascular:  No chest pain, dyspnea on exertion, orthopnea, palpitations, paroxysmal nocturnal dyspnea. Positive for edema.  Dermatological: No  rash, lesions/masses Respiratory: No cough, dyspnea Urologic: No hematuria, dysuria Abdominal:   No nausea, vomiting, diarrhea, bright red blood per rectum, melena, or hematemesis Neurologic:  No visual changes, wkns, changes in mental status. All other systems reviewed and are otherwise negative except as noted above.   Physical Exam:    VS:  BP (!) 126/54 (BP Location: Left Arm, Patient Position: Sitting, Cuff Size: Normal)   Pulse 84   Ht 5\' 11"  (1.803 m)   Wt 235 lb (106.6 kg)   BMI 32.78 kg/m    General: Well developed, well nourished Caucasian male appearing in no acute distress. Head: Normocephalic, atraumatic, sclera non-icteric, no xanthomas, nares are without discharge.  Neck: No carotid bruits. JVD not elevated.  Lungs: Respirations regular and unlabored, without wheezes or rales.  Heart: Regular rate and rhythm. No S3 or S4.  No murmur, no rubs, or gallops appreciated. Sternal wound noted.  Abdomen: Soft, non-tender, non-distended with normoactive bowel sounds. No hepatomegaly. No rebound/guarding. No obvious abdominal masses. Msk:  Strength and tone appear normal for age. No joint deformities or effusions. Extremities: No clubbing or cyanosis. 1+ pitting edema along right lower extremity.  Distal pedal pulses are 2+ bilaterally. Neuro: Alert and oriented X 3. Moves all extremities spontaneously. No focal deficits noted. Psych:  Responds to questions appropriately with a normal affect. Skin: No rashes or lesions noted  Wt Readings from Last 3 Encounters:  12/27/16 235 lb (106.6 kg)  12/17/16 240 lb (108.9 kg)  12/14/16 243 lb (110.2 kg)     Studies/Labs Reviewed:   EKG:  EKG is not ordered today.    Recent Labs: 12/14/2016: TSH 2.260 12/18/2016: BUN 21; Creatinine, Ser 0.91; Hemoglobin 12.2; Platelets 118; Potassium 4.1; Sodium 134   Lipid Panel    Component Value Date/Time   CHOL 142 06/17/2011 0400   TRIG 709 (H) 06/17/2011 0400   HDL 10 (L) 06/17/2011 0400     CHOLHDL 14.2 06/17/2011 0400   VLDL UNABLE TO CALCULATE IF TRIGLYCERIDE OVER 400 mg/dL 06/17/2011 0400   LDLCALC UNABLE TO CALCULATE IF TRIGLYCERIDE OVER 400 mg/dL 06/17/2011 0400    Additional studies/ records that were reviewed today include:   Cardiac Catheterization: 12/17/2016  Prox RCA  to Mid RCA lesion, 80 %stenosed.  Mid RCA lesion, 80 %stenosed.  Dist RCA lesion, 95 %stenosed.  Prox Cx lesion, 95 %stenosed.  Ost 1st Mrg to 1st Mrg lesion, 95 %stenosed.  Ost Cx to Prox Cx lesion, 70 %stenosed.  Mid Cx to Dist Cx lesion, 80 %stenosed.  Ost 1st Diag to 1st Diag lesion, 90 %stenosed.  Ost 2nd Diag to 2nd Diag lesion, 90 %stenosed.  Mid LAD to Dist LAD lesion, 90 %stenosed.  Dist LAD lesion, 100 %stenosed.  LIMA.  Left radial artery.  SVG.  Origin to Prox Graft lesion, 100 %stenosed.  SVG and is large and anatomically normal.  Mid Graft lesion, 80 %stenosed.  Prox Graft lesion, 80 %stenosed.  Post intervention, there is a 0% residual stenosis.  A stent was successfully placed.  Dist Graft to Insertion lesion, 95 %stenosed.  Post intervention, there is a 0% residual stenosis.  A stent was successfully placed.  The left ventricular systolic function is normal.  LV end diastolic pressure is normal.  The left ventricular ejection fraction is 50-55% by visual estimate.   Final impression: Successful PCI and drug-eluting stenting of the mid and distal RCA SVG in a diffusely diseased vein graft 50 years old. Distal protection was used. Patient had good flow into the case. This SVG to diagonal branch was occluded at the aorta and he does have mid circumflex high-grade stenosis as well as first obtuse marginal branch stenosis but this is a relatively small vessel and the left radial graft to the distal OM is patent providing retrograde flow.  The guide catheter and wire were removed. Sheath was secured. Angiomax will continue for 4 additional hours of full  dose. The patient was discharged on the morning on aspirin and Plavix and will see back in the office in 2-3 weeks. He left the lab in stable condition.   Assessment:    1. Coronary artery disease involving coronary bypass graft of native heart with unstable angina pectoris ([place])   2. Essential hypertension   3. Hyperlipidemia LDL goal <70   4. Type 2 diabetes mellitus treated with insulin ([place])      Plan:   In order of problems listed above:  1. CAD  - s/p CABG in 2014 with LIMA-LAD, RI-OM, SVG-D1, and SVG-PDA. Recently underwent a repeat cath on 12/17/2016 which showed severe native CAD with patent LIMA-LAD, patent RI-OM, significant 80-95% stenosis along the SVG-RCA and occluded SVG-D1. Successful PCI was performed with placement of 3 DES to SVG-RCA. The SVG to diagonal branch was occluded at the aorta and he did have mid-circumflex high-grade stenosis as well as first obtuse marginal branch stenosis but this was a relatively small vessel and the left radial graft to the distal OM was patent, providing retrograde flow.  - he denies any repeat episodes of chest discomfort and reports breathing is at baseline. - continue DAPT with ASA and Plavix along with BB and statin therapy.   2. HTN - BP is well-controlled at 126/54 during today's visit. - continue Imdur 30mg  daily, Toprol-XL 50mg  daily, and Ramipril 5mg  daily.   3. HLD - followed by PCP. Continue Simvastatin 10mg  daily.   4. IDDM - per admitting team.    Medication Adjustments/Labs and Tests Ordered: Current medicines are reviewed at length with the patient today.  Concerns regarding medicines are outlined above.  Medication changes, Labs and Tests ordered today are listed in the Patient Instructions below. Patient Instructions  Medication Instructions: Your physician recommends that  you continue on your current medications as directed. Please refer to the Current Medication list given to you today.  Follow-Up: Your  physician recommends that you schedule a follow-up appointment in: 3 months with Dr. Gwenlyn Found.    Signed, Erma Heritage, PA-C  12/28/2016 7:38 AM    [address] [place], [place], [place]  [address] Phone: [phone]; Fax: [phone]  [address], [place], [place] [address] Phone: [phone]

## 2016-12-27 ENCOUNTER — Encounter: Payer: Self-pay | Admitting: Student

## 2016-12-27 ENCOUNTER — Telehealth: Payer: Self-pay | Admitting: Student

## 2016-12-27 ENCOUNTER — Ambulatory Visit (INDEPENDENT_AMBULATORY_CARE_PROVIDER_SITE_OTHER): Payer: Commercial Managed Care - PPO | Admitting: Student

## 2016-12-27 VITALS — BP 126/54 | HR 84 | Ht 71.0 in | Wt 235.0 lb

## 2016-12-27 DIAGNOSIS — E119 Type 2 diabetes mellitus without complications: Secondary | ICD-10-CM | POA: Diagnosis not present

## 2016-12-27 DIAGNOSIS — E785 Hyperlipidemia, unspecified: Secondary | ICD-10-CM | POA: Diagnosis not present

## 2016-12-27 DIAGNOSIS — I25700 Atherosclerosis of coronary artery bypass graft(s), unspecified, with unstable angina pectoris: Secondary | ICD-10-CM

## 2016-12-27 DIAGNOSIS — Z794 Long term (current) use of insulin: Secondary | ICD-10-CM

## 2016-12-27 DIAGNOSIS — I10 Essential (primary) hypertension: Secondary | ICD-10-CM

## 2016-12-27 MED ORDER — ISOSORBIDE MONONITRATE ER 30 MG PO TB24: 15.0000 mg | ORAL_TABLET | Freq: Every day | ORAL | 3 refills | Status: DC

## 2016-12-27 MED ORDER — CLOPIDOGREL BISULFATE 75 MG PO TABS: 75.0000 mg | ORAL_TABLET | Freq: Every day | ORAL | 3 refills | Status: DC

## 2016-12-27 NOTE — Telephone Encounter (Signed)
Received [employer] Disability and Leave Forms from [place].  Forms given to B. Strader, PA.  (patient has appointment this am.)  Bernerd Pho PA returned completed and signed Forms.  Signed Forms sent to [employer] @ [facility] via [place] 12/27/16. lp

## 2016-12-27 NOTE — Patient Instructions (Signed)
Medication Instructions: Your physician recommends that you continue on your current medications as directed. Please refer to the Current Medication list given to you today.   Follow-Up: Your physician recommends that you schedule a follow-up appointment in: 3 months with Dr. Berry.     

## 2017-04-02 NOTE — Progress Notes (Signed)
[facility] Note  04/03/2017     CHIEF COMPLAINT Patient presents for Retina Evaluation and Diabetic Eye Exam   HISTORY OF PRESENT ILLNESS: Juan [name] is a 52 y.o. male who presents to the clinic today for:   HPI    Retina Evaluation    In both eyes.  This started 5 years ago.  Associated Symptoms Floaters.  Negative for Distortion, Photophobia, Jaw Claudication, Shoulder/Hip pain, Glare, Blind Spot, Fatigue, Weight Loss, Scalp Tenderness, Redness, Flashes, Pain, Trauma and Fever.  Context:  distance vision, mid-range vision and near vision.  Treatments tried include no treatments.  I, the attending physician,  performed the HPI with the patient and updated documentation appropriately.          Diabetic Eye Exam    Vision is stable.  Associated Symptoms Floaters.  Negative for Photophobia, Jaw Claudication, Fatigue, Shoulder/Hip pain, Glare, Blind Spot, Distortion, Redness, Scalp Tenderness, Weight Loss, Fever, Trauma, Pain and Flashes.  Diabetes characteristics include Type 2, on insulin and taking oral medications.  This started 25 years ago.  Blood sugar level fluctuates.  Last Blood Glucose 131.  Last A1C 8.  Associated Diagnosis Neuropathy.  I, the attending physician,  performed the HPI with the patient and updated documentation appropriately.          Comments    Referral of [name] for DME. Patient states for many years he has had occasional  floaters OU. Pt reports having adhesive dust getting into his eyes appx 2 yrs ago . Pt reports having neuropathy and blurred vision ou. Denies pain, glare and flashes.  Pt is a diabetic and bs fluctuate but denies visual changes with bs. . Bs this am 131. A1C 8 Last week. Pt is on insulin pump  and Metformin. Denies vits/ gtts       Last edited by Juan Caffey, MD on 04/03/2017  2:47 PM. (History)    Pt reports he has annual eye exams from Dr. Patrice [name] at [facility] eye care; Pt states that upon last visit with  Dr. Patrice [name] he was told he has retinopathy; Pt states he told his PCP this and was sent to Dr. Coralyn [name]; Pt reports he has floaters OU off and on x 25 years; Pt has been diabetic x 25 years, pt report he is taking oral medications and insulin; Pt reports he was dx with non hodgekins lymphoma in the 90's, pt states he was cleared in 1998; Pt reports he does not get scans regularly for hx of lymphoma; Pt states he had an uncontrolled bout of diabetes in 2017, states CBG was in the upper 400s; Pt reports BP is well controled on oral medication;   Referring physician: [name], MD [address], [place] [address]  HISTORICAL INFORMATION:   Selected notes from the MEDICAL RECORD NUMBER Referred by Dr. Cyd [name] for DM exam LEE-  Ocular Hx-  PMH- type II DM, HTN, hyperlipidemia, hypertriglyceridemia, anemia, non hodgekins lymphoma, sleep apnea; last A1C 8.0    CURRENT MEDICATIONS: No current outpatient medications on file. (Ophthalmic Drugs)   No current facility-administered medications for this visit.  (Ophthalmic Drugs)   Current Outpatient Medications (Other)  Medication Sig  . aspirin 81 MG chewable tablet Chew 1 tablet (81 mg total) by mouth daily.  . betamethasone dipropionate (DIPROLENE) 0.05 % cream Apply 1 application topically 2 (two) times daily.  . cephALEXin (KEFLEX) 500 MG capsule TAKE 1 CAPSULE BY MOUTH TWICE  A DAY FOR 10 DAYS  . clopidogrel (PLAVIX) 75 MG tablet Take 1 tablet (75 mg total) by mouth daily with breakfast.  . FARXIGA 5 MG TABS tablet Take 5 mg by mouth daily.  . fenofibrate 160 MG tablet Take 160 mg by mouth daily.   . furosemide (LASIX) 40 MG tablet Take 1 tablet (40 mg total) by mouth daily.  . Insulin Human (INSULIN PUMP) SOLN Inject into the skin.  Marland Kitchen insulin regular human CONCENTRATED (HUMULIN R) 500 UNIT/ML SOLN injection Inject into the skin. Using insulin pump  . isosorbide mononitrate (IMDUR) 30 MG 24 hr tablet Take 0.5 tablets (15 mg  total) by mouth daily.  . metFORMIN (GLUCOPHAGE) 1000 MG tablet Take 1,000 mg by mouth 2 (two) times daily with a meal.  . metoprolol succinate (TOPROL-XL) 50 MG 24 hr tablet TAKE 1 TABLET EVERY DAY WITH OR IMMEDIATELY  . nitroGLYCERIN (NITROSTAT) 0.4 MG SL tablet Place 1 tablet (0.4 mg total) under the tongue every 5 (five) minutes as needed.  . Omega-3 Fatty Acids (FISH OIL) 1000 MG CAPS Take 6,000 mg by mouth 2 (two) times daily.   . ramipril (ALTACE) 5 MG capsule Take 5 mg by mouth daily.  . simvastatin (ZOCOR) 10 MG tablet TAKE 1 TABLET(10 MG) BY MOUTH DAILY  . clindamycin (CLEOCIN) 300 MG capsule Take 300 mg by mouth 3 (three) times daily.   No current facility-administered medications for this visit.  (Other)      REVIEW OF SYSTEMS: ROS    Positive for: Genitourinary, Musculoskeletal, Endocrine, Cardiovascular, Eyes   Negative for: Constitutional, Gastrointestinal, Neurological, Skin, HENT, Respiratory, Psychiatric, Allergic/Imm, Heme/Lymph   Last edited by Juan Jordan, LPN on 5/73/2202  5:42 PM. (History)       ALLERGIES Allergies  Allergen Reactions  . Reglan [Metoclopramide] Other (See Comments)    anxiety    PAST MEDICAL HISTORY Past Medical History:  Diagnosis Date  . Abnormal cardiovascular stress test 02/06/2013  . Anemia    occasional - no problems currently(10/02/2011)  . Bilateral renal cysts 06/16/2011   states no known problems  . Blood transfusion without reported diagnosis   . CAD (coronary artery disease), LAD 90%, 1st diag 95%, LCX 70% wth AV groove 90%, RCA 40-50% mid and 80% long distal stenosis 02/03/13 02/04/2013  . Chest pain    positive Myoview stress test  . Coronary artery calcification seen on CAT scan   . Diabetes mellitus    IDDM  . Fatty liver disease, nonalcoholic 09/22/2374  . Hyperlipidemia   . Hypertension    states no dx. of HTN, takes med. to protect kidneys due to DM  . Lateral meniscus tear 09/2011   left  . Loose body in  knee 09/2011   loose bodies left knee  . Non Hodgkin's lymphoma ([place]) 1991  . Pancreatitis    occasional - last episode 06/2011  . S/P CABG x 4, 02/05/13 LIMA-LAD; LT. RADIAL-OM;VG-DIAG; VG-PDA 02/06/2013   10/18 3/4 patent grafts (occluded SVG-->Diag), PCI/DESx 3 SVG-->RCA, normal EF  . Splenomegaly, congestive, chronic   . Stuffy and runny nose 10/02/2011   yellow drainage from nose   Past Surgical History:  Procedure Laterality Date  . CORONARY ARTERY BYPASS GRAFT N/A 02/05/2013   Procedure: CORONARY ARTERY BYPASS GRAFTING (CABG);  Surgeon: Ivin Poot, MD;  Location: [place];  Service: Open Heart Surgery;  Laterality: N/A;  Coronary artery bypass graft on pump times four using left internal mammary artery and right greater saphenous vein  via endovein harvest and left radial artery harvest.   . CORONARY STENT INTERVENTION  12/17/2016    PCI and drug-eluting stenting of the mid and distal RCA SVG   . CORONARY STENT INTERVENTION N/A 12/17/2016   Procedure: CORONARY STENT INTERVENTION;  Surgeon: Lorretta Harp, MD;  Location: [facility] [facility];  Service: Cardiovascular;  Laterality: N/A;  . ELBOW SURGERY    . HERNIA REPAIR     inguinal   . INTRAOPERATIVE TRANSESOPHAGEAL ECHOCARDIOGRAM N/A 02/05/2013   Procedure: INTRAOPERATIVE TRANSESOPHAGEAL ECHOCARDIOGRAM;  Surgeon: Ivin Poot, MD;  Location: [facility];  Service: Open Heart Surgery;  Laterality: N/A;  . KNEE ARTHROSCOPY  10/09/2011   Procedure: ARTHROSCOPY KNEE;  Surgeon: Nita Sells, MD;  Location: [facility];  Service: Orthopedics;  Laterality: Left;  . LEFT HEART CATH AND CORS/GRAFTS ANGIOGRAPHY N/A 12/17/2016   Procedure: LEFT HEART CATH AND CORS/GRAFTS ANGIOGRAPHY;  Surgeon: Lorretta Harp, MD;  Location: [place] [facility];  Service: Cardiovascular;  Laterality: N/A;  . LEFT HEART CATHETERIZATION WITH CORONARY ANGIOGRAM N/A 02/03/2013   Procedure: LEFT HEART CATHETERIZATION WITH CORONARY ANGIOGRAM;   Surgeon: Lorretta Harp, MD;  Location: [facility];  Service: Cardiovascular;  Laterality: N/A;  . NASAL SEPTUM SURGERY    . RADIAL ARTERY HARVEST Left 02/05/2013   Procedure: RADIAL ARTERY HARVEST;  Surgeon: Ivin Poot, MD;  Location: [facility];  Service: Vascular;  Laterality: Left;  . TIBIA BONE BIOPSY  x 3   left  . TONSILLECTOMY      FAMILY HISTORY Family History  Adopted: Yes    SOCIAL HISTORY Social History   Tobacco Use  . Smoking status: Never Smoker  . Smokeless tobacco: Never Used  Substance Use Topics  . Alcohol use: No  . Drug use: No         OPHTHALMIC EXAM:  Base Eye Exam    Visual Acuity (Snellen - Linear)      Right Left   Dist cc 20/25 +1 20/20 -2   Dist ph cc 20/20 -1        Tonometry (Tonopen, 2:12 PM)      Right Left   Pressure 15 12       Pupils      Dark Light Shape React APD   Right 4 3 Round Slow None   Left 4 3 Round Slow None       Visual Fields (Counting fingers)      Left Right    Full Full       Extraocular Movement      Right Left    Full, Ortho Full, Ortho       Neuro/Psych    Oriented x3:  Yes   Mood/Affect:  Normal       Dilation    Both eyes:  1.0% Mydriacyl, 2.5% Phenylephrine @ 2:14 PM        Slit Lamp and Fundus Exam    Slit Lamp Exam      Right Left   Lids/Lashes Normal Normal   Conjunctiva/Sclera White and quiet White and quiet   Cornea Inferior-temporal K scar, 1+ Punctate epithelial erosions, mild Arcus 1+ Punctate epithelial erosions, mild Arcus, scattered peripheral areas of corneal haze / mild scarring   Anterior Chamber Deep and quiet Deep and quiet   Iris Round and dilated, No NVI Round and dilated, No NVI   Lens 1-2+ Nuclear sclerosis, 1-2+ Cortical cataract 1-2+ Nuclear sclerosis, 1-2+ Cortical cataract  Vitreous Vitreous syneresis Vitreous syneresis       Fundus Exam      Right Left   Disc sharp, No NVD Normal, No NVD   C/D Ratio 0.25 0.35   Macula Good foveal reflex, few  Microaneurysms, No edema Good foveal reflex, few Microaneurysms, no edema   Vessels Mild Copper wiring, mild AV crossing changes Mild Copper wiring, mild AV crossing changes   Periphery Attached, scattered punctate RPE changes, focal area of exudation just above superior arcade 0.5 DD in size Attached, exudates along inferior arcade, white centered hemorrhage above the supratemporal arcade, dot hem inferior to inferior arcade, focal area of exudation off the 0800 nerve with central hemorrhage, focal hemorrhage with surrounding exudation at 0100 1.5 DD from disc        Refraction    Wearing Rx      Sphere Cylinder Axis Add   Right -4.00   +1.25   Left -3.75 +0.50 060 +1.25       Manifest Refraction      Sphere Cylinder Axis Dist VA   Right -4.00   20/20-2   Left -3.75 +0.50 060 20/20-2          IMAGING AND PROCEDURES  Imaging and Procedures for 04/04/17  OCT, Retina - OU - Both Eyes     Right Eye Quality was good. Central Foveal Thickness: 284. Progression has no prior data. Findings include normal foveal contour, no IRF, no SRF.   Left Eye Quality was good. Central Foveal Thickness: 287. Progression has no prior data. Findings include normal foveal contour, no IRF, no SRF.   Notes *Images captured and stored on drive  Diagnosis / Impression:  NFP, No IRF/SRF focal areas of retinal thickening nasal periphery OU  Clinical management:  See below  Abbreviations: NFP - Normal foveal profile. CME - cystoid macular edema. PED - pigment epithelial detachment. IRF - intraretinal fluid. SRF - subretinal fluid. EZ - ellipsoid zone. ERM - epiretinal membrane. ORA - outer retinal atrophy. ORT - outer retinal tubulation. SRHM - subretinal hyper-reflective material         Fluorescein Angiography Optos (Transit OS)     Right Eye Progression has no prior data. Early phase findings include microaneurysm, leakage. Mid/Late phase findings include leakage, microaneurysm.   Left  Eye Progression has no prior data. Early phase findings include microaneurysm, blockage. Mid/Late phase findings include microaneurysm, leakage.   Notes Impression:   OD - 360 peripheral MAs with late leakage; focal area of hyperfluorescent leakage above ST arcade, No NV OS - 360 peripheral Mas with late leakage; focal areas of hyperfluorescent leakage at 1100 and 0800 about the disc, No NV                ASSESSMENT/PLAN:    ICD-10-CM   1. Diabetic retinal microaneurysm (HCC) E11.319 Fluorescein Angiography Optos (Transit OS)   H35.049   2. Moderate nonproliferative diabetic retinopathy of both eyes without macular edema associated with type 2 diabetes mellitus (HCC) O75.6433 Fluorescein Angiography Optos (Transit OS)  3. Essential hypertension I10   4. Hypertensive retinopathy of both eyes H35.033 Fluorescein Angiography Optos (Transit OS)  5. Retinal edema H35.81 OCT, Retina - OU - Both Eyes    Fluorescein Angiography Optos (Transit OS)  6. Combined forms of age-related cataract of both eyes H25.813     1,2. DMII w/ nonproliferative diabetic retinopathy w/o CSME, both eyes - The incidence, risk factors for progression, natural history and treatment options for diabetic retinopathy  were discussed with patient.   - The need for close monitoring of blood glucose, blood pressure, and serum lipids, avoiding cigarette or any type of tobacco, and the need for long term follow up was also discussed with patient. - exam shows focal areas of hemorrhage and exudation OU  - FA shows non central, focal areas of hemorrhage and leakage - OCT without central diabetic macular edema, both eyes , but focal areas of retinal edema / thickening corresponding to peripheral / noncentral areas of hemorrhage and exudation - no intervention indicated at this time, but discussed treatment options should disease progress to central or macular edema - monitor for now and encouraged tight BG and BP  control - f/u in 2-3 mos  3,4. Hypertensive retinopathy OU - discussed importance of tight BP control - monitor  5. Peripheral retinal edema noted on exam and OCT as described above  6. Combined form age-related cataract OU-  - The symptoms of cataract, surgical options, and treatments and risks were discussed with patient. - discussed diagnosis and progression - not yet visually significant - monitor for now   Ophthalmic Meds Ordered this visit:  No orders of the defined types were placed in this encounter.      Return in about 7 weeks (around 05/22/2017) for F/U NPDR OU.  There are no Patient Instructions on file for this visit.   Explained the diagnoses, plan, and follow up with the patient and they expressed understanding.  Patient expressed understanding of the importance of proper follow up care.   This document serves as a record of services personally performed by Gardiner Sleeper, MD, PhD. It was created on their behalf by Catha Brow, [place], a certified ophthalmic assistant. The creation of this record is the provider's dictation and/or activities during the visit.  Electronically signed by: Catha Brow, COA  04/04/17 1:44 PM    Gardiner Sleeper, M.D., Ph.D. Diseases & Surgery of the Retina and [place] 04/04/17   I have reviewed the above documentation for accuracy and completeness, and I agree with the above. Gardiner Sleeper, M.D., Ph.D. 04/04/17 1:49 PM     Abbreviations: M myopia (nearsighted); A astigmatism; H hyperopia (farsighted); P presbyopia; Mrx spectacle prescription;  CTL contact lenses; OD right eye; OS left eye; OU both eyes  XT exotropia; ET esotropia; PEK punctate epithelial keratitis; PEE punctate epithelial erosions; DES dry eye syndrome; MGD meibomian gland dysfunction; ATs artificial tears; PFAT's preservative free artificial tears; [place] nuclear sclerotic cataract; PSC posterior subcapsular cataract; ERM  epi-retinal membrane; PVD posterior vitreous detachment; RD retinal detachment; DM diabetes mellitus; DR diabetic retinopathy; NPDR non-proliferative diabetic retinopathy; PDR proliferative diabetic retinopathy; CSME clinically significant macular edema; DME diabetic macular edema; dbh dot blot hemorrhages; CWS cotton wool spot; POAG primary open angle glaucoma; C/D cup-to-disc ratio; HVF humphrey visual field; GVF goldmann visual field; OCT optical coherence tomography; IOP intraocular pressure; BRVO Branch retinal vein occlusion; CRVO central retinal vein occlusion; CRAO central retinal artery occlusion; BRAO branch retinal artery occlusion; RT retinal tear; SB scleral buckle; PPV pars plana vitrectomy; VH Vitreous hemorrhage; PRP panretinal laser photocoagulation; IVK intravitreal kenalog; VMT vitreomacular traction; MH Macular hole;  NVD neovascularization of the disc; NVE neovascularization elsewhere; AREDS age related eye disease study; ARMD age related macular degeneration; POAG primary open angle glaucoma; EBMD epithelial/anterior basement membrane dystrophy; ACIOL anterior chamber intraocular lens; IOL intraocular lens; PCIOL posterior chamber intraocular lens; Phaco/IOL phacoemulsification with intraocular lens placement; PRK photorefractive  keratectomy; LASIK laser assisted in situ keratomileusis; HTN hypertension; DM diabetes mellitus; COPD chronic obstructive pulmonary disease

## 2017-04-03 ENCOUNTER — Encounter (INDEPENDENT_AMBULATORY_CARE_PROVIDER_SITE_OTHER): Payer: Self-pay | Admitting: Ophthalmology

## 2017-04-03 ENCOUNTER — Encounter: Payer: Self-pay | Admitting: Cardiovascular Disease

## 2017-04-03 ENCOUNTER — Ambulatory Visit: Payer: Commercial Managed Care - PPO | Admitting: Cardiovascular Disease

## 2017-04-03 ENCOUNTER — Encounter (INDEPENDENT_AMBULATORY_CARE_PROVIDER_SITE_OTHER): Payer: Self-pay

## 2017-04-03 ENCOUNTER — Ambulatory Visit (INDEPENDENT_AMBULATORY_CARE_PROVIDER_SITE_OTHER): Payer: Commercial Managed Care - PPO | Admitting: Ophthalmology

## 2017-04-03 DIAGNOSIS — E785 Hyperlipidemia, unspecified: Secondary | ICD-10-CM

## 2017-04-03 DIAGNOSIS — I519 Heart disease, unspecified: Secondary | ICD-10-CM

## 2017-04-03 DIAGNOSIS — E113393 Type 2 diabetes mellitus with moderate nonproliferative diabetic retinopathy without macular edema, bilateral: Secondary | ICD-10-CM | POA: Diagnosis not present

## 2017-04-03 DIAGNOSIS — I10 Essential (primary) hypertension: Secondary | ICD-10-CM

## 2017-04-03 DIAGNOSIS — Z951 Presence of aortocoronary bypass graft: Secondary | ICD-10-CM | POA: Diagnosis not present

## 2017-04-03 DIAGNOSIS — I5189 Other ill-defined heart diseases: Secondary | ICD-10-CM

## 2017-04-03 DIAGNOSIS — H25813 Combined forms of age-related cataract, bilateral: Secondary | ICD-10-CM | POA: Diagnosis not present

## 2017-04-03 DIAGNOSIS — H35033 Hypertensive retinopathy, bilateral: Secondary | ICD-10-CM

## 2017-04-03 DIAGNOSIS — H3581 Retinal edema: Secondary | ICD-10-CM | POA: Diagnosis not present

## 2017-04-03 DIAGNOSIS — H35049 Retinal micro-aneurysms, unspecified, unspecified eye: Secondary | ICD-10-CM

## 2017-04-03 DIAGNOSIS — E11319 Type 2 diabetes mellitus with unspecified diabetic retinopathy without macular edema: Secondary | ICD-10-CM

## 2017-04-03 NOTE — Progress Notes (Signed)
04/03/2017 Juan [name]   November 02, 1965  [id number]  Primary Physician System, Pcp Not In Primary Cardiologist: Lorretta Harp MD FACP, [place], [place], [place]  HPI:  Juan [name] is a 52 y.o.  mild to moderately overweight, married Caucasian male, father of 1 child who I last saw in the office 03/23/16. He has seen Dr. Sallyanne Kuster twice in 2011. He has a long history of insulin-dependent diabetes as well as extremely high hypertriglyceridemia in the 3000-6000 range with multiple episodes of pancreatitis. He had negative venous Dopplers for DVTs. He does wear compression stockings. He has complained of left inframammary chest pain and increasing shortness of breath. He is not aware of his family history since he was adopted. He had a CT scan of his chest and abdomen which showed a mass at the tail of his pancreas but also showed severe calcification in the LAD. He did have a Myoview stress test performed 2 years ago that was nonischemic. At that time, he had no symptoms of chest pain or shortness of breath. I referred him to Dr. Lavetta Nielsen at [facility] for more intense treatment of his severe hypertriglyceridemia.since I saw him last in December of last several weeks he developed exertional chest pain and shortness of breath.I was concerned that he has developed progression of disease given all of his risk factors.  He had a Myoview stress test performed that showed inferior ischemia which is high risk. He continued to have exertional chest pain with left upper irradiation.I performed cardiac catheterization on him 02/03/13 revealing three-vessel disease and preserved LV function. He subsequently underwent coronary artery bypass bypass grafting x4 by Dr. Tharon Aquas Trigt with a LIMA to his LAD, a left radial to obtuse marginal branch, vein graft to an diagonal branch and PDA. His postop course was uncomplicated. He completed 7 weeks her cardiac rehabilitation and is back to work. Since I  saw him in the office 4 months ago he has seen Kerin Ransom back who ordered a 2-D echo that showed normal LV systolic function, grade 2 diastolic dysfunction and a Myoview stress test that was normal as well.  Saw [place] last in October he is complaining of chest pain and left upper extremity pain. Based on this I performed outpatient cardiac catheterization on him 12/17/16 revealing high-grade disease in his RCA SVG which is stented using several synergy drug-eluting stents. This resulted in resolution of his anginal symptoms. He also has recurrent cellulitis of his right lower extremity on antibiotics with chronic lower extremity edema for which she uses compression stockings.      Current Meds  Medication Sig  . aspirin 81 MG chewable tablet Chew 1 tablet (81 mg total) by mouth daily.  . betamethasone dipropionate (DIPROLENE) 0.05 % cream Apply 1 application topically 2 (two) times daily.  . cephALEXin (KEFLEX) 500 MG capsule TAKE 1 CAPSULE BY MOUTH TWICE A DAY FOR 10 DAYS  . clopidogrel (PLAVIX) 75 MG tablet Take 1 tablet (75 mg total) by mouth daily with breakfast.  . FARXIGA 5 MG TABS tablet Take 5 mg by mouth daily.  . fenofibrate 160 MG tablet Take 160 mg by mouth daily.   . furosemide (LASIX) 40 MG tablet Take 1 tablet (40 mg total) by mouth daily.  . Insulin Human (INSULIN PUMP) SOLN Inject into the skin.  Marland Kitchen isosorbide mononitrate (IMDUR) 30 MG 24 hr tablet Take 0.5 tablets (15 mg total) by mouth daily.  . metFORMIN (GLUCOPHAGE) 1000  MG tablet Take 1,000 mg by mouth 2 (two) times daily with a meal.  . metoprolol succinate (TOPROL-XL) 50 MG 24 hr tablet TAKE 1 TABLET EVERY DAY WITH OR IMMEDIATELY  . nitroGLYCERIN (NITROSTAT) 0.4 MG SL tablet Place 1 tablet (0.4 mg total) under the tongue every 5 (five) minutes as needed.  . Omega-3 Fatty Acids (FISH OIL) 1000 MG CAPS Take 6,000 mg by mouth 2 (two) times daily.   . ramipril (ALTACE) 5 MG capsule Take 5 mg by mouth daily.  . simvastatin  (ZOCOR) 10 MG tablet TAKE 1 TABLET(10 MG) BY MOUTH DAILY     No Known Allergies  Social History   Socioeconomic History  . Marital status: Married    Spouse name: [name]  . Number of children: 1  . Years of education: 28  . Highest education level: Not on file  Social Needs  . Financial resource strain: Not on file  . Food insecurity - worry: Not on file  . Food insecurity - inability: Not on file  . Transportation needs - medical: Not on file  . Transportation needs - non-medical: Not on file  Occupational History  . Occupation: Engineer, drilling     Employer: [employer]  Tobacco Use  . Smoking status: Never Smoker  . Smokeless tobacco: Never Used  Substance and Sexual Activity  . Alcohol use: No  . Drug use: No  . Sexual activity: Yes  Other Topics Concern  . Not on file  Social History Narrative   Adopted, so no pertinent FH.  Married.  Lives with wife in [place].  Independent of ADLs and ambulation.     Review of Systems: General: negative for chills, fever, night sweats or weight changes.  Cardiovascular: negative for chest pain, dyspnea on exertion, edema, orthopnea, palpitations, paroxysmal nocturnal dyspnea or shortness of breath Dermatological: negative for rash Respiratory: negative for cough or wheezing Urologic: negative for hematuria Abdominal: negative for nausea, vomiting, diarrhea, bright red blood per rectum, melena, or hematemesis Neurologic: negative for visual changes, syncope, or dizziness All other systems reviewed and are otherwise negative except as noted above.    Blood pressure 134/62, pulse 70, height 5\' 11"  (1.803 m), weight 240 lb (108.9 kg).  General appearance: alert and no distress Neck: no adenopathy, no carotid bruit, no JVD, supple, symmetrical, trachea midline and thyroid not enlarged, symmetric, no tenderness/mass/nodules Lungs: clear to auscultation bilaterally Heart: regular rate and rhythm, S1, S2 normal,  no murmur, click, rub or gallop Extremities: extremities normal, atraumatic, no cyanosis or edema Pulses: 2+ and symmetric Skin: Skin color, texture, turgor normal. No rashes or lesions Neurologic: Alert and oriented X 3, normal strength and tone. Normal symmetric reflexes. Normal coordination and gait  EKG not performed today  ASSESSMENT AND PLAN:   Dyslipidemia History of hyperlipidemia/hypertriglyceridemia on fenofibrate and simvastatin on by Dr. Arther Dames at [facility]  S/P CABG x 4 History of CAD status post coronary artery bypass grafting 4 02/05/13 with a LIMA to the LAD, vein to ramus ramus branch, obtuse marginal branch, diagonal branch and PDA. Because of chest pain which was fairly typical I performed outpatient cardiac Catheterization on him 12/17/16 revealing an occluded vein to diagonal branch, patent LIMA to the LAD, patent vein to a obtuse marginal branch with high-grade proximal mid and distal RCA SVG stenosis which is stented using several synergy drug-eluting stents. I had a favorable and graphic result. His anginal symptoms have resolved. He remains on dual antiplatelet therapy.  Diastolic dysfunction History of diastolic heart failure with grade 2 diastolic dysfunction and preserved LV function on oral diuretics. He does have chronic lower extremity edema probably multifactorial related to venous insufficiency and diastolic heart failure.        Lorretta Harp MD FACP,FACC,FAHA, [facility] 04/03/2017 9:27 AM

## 2017-04-03 NOTE — Assessment & Plan Note (Signed)
History of CAD status post coronary artery bypass grafting 4 02/05/13 with a LIMA to the LAD, vein to ramus ramus branch, obtuse marginal branch, diagonal branch and PDA. Because of chest pain which was fairly typical I performed outpatient cardiac Catheterization on him 12/17/16 revealing an occluded vein to diagonal branch, patent LIMA to the LAD, patent vein to a obtuse marginal branch with high-grade proximal mid and distal RCA SVG stenosis which is stented using several synergy drug-eluting stents. I had a favorable and graphic result. His anginal symptoms have resolved. He remains on dual antiplatelet therapy.

## 2017-04-03 NOTE — Patient Instructions (Signed)

## 2017-04-03 NOTE — Assessment & Plan Note (Signed)
History of diastolic heart failure with grade 2 diastolic dysfunction and preserved LV function on oral diuretics. He does have chronic lower extremity edema probably multifactorial related to venous insufficiency and diastolic heart failure.

## 2017-04-03 NOTE — Assessment & Plan Note (Signed)
History of hyperlipidemia/hypertriglyceridemia on fenofibrate and simvastatin on by Dr. Arther Dames at [facility]

## 2017-04-04 ENCOUNTER — Encounter (INDEPENDENT_AMBULATORY_CARE_PROVIDER_SITE_OTHER): Payer: Self-pay | Admitting: Ophthalmology

## 2017-05-20 NOTE — Progress Notes (Signed)
[facility] Note  05/22/2017     CHIEF COMPLAINT Patient presents for Retina Follow Up   HISTORY OF PRESENT ILLNESS: Juan [name] is a 52 y.o. male who presents to the clinic today for:   HPI    Retina Follow Up    Patient presents with  Diabetic Retinopathy.  In both eyes.  Severity is moderate.  Duration of 7 weeks.  Since onset it is stable.  I, the attending physician,  performed the HPI with the patient and updated documentation appropriately.          Comments    Pt present today for F/U for NPDR OU, pt states VA is the same, he states he has never had vision problems, pt states he has the same floaters he's had for years, but denies flashes, wavy vision, or pain, pts BS is currently 403, last A1C was 8.2, pt denies the use of gtts,        Last edited by Bernarda Caffey, MD on 05/22/2017  1:46 PM. (History)       Referring physician: No referring provider defined for this encounter.  HISTORICAL INFORMATION:   Selected notes from the MEDICAL RECORD NUMBER Referred by Dr. [name] for DM exam LEE-  Ocular Hx-  PMH- type II DM, HTN, hyperlipidemia, hypertriglyceridemia, anemia, non hodgekins lymphoma, sleep apnea; last A1C 8.0    CURRENT MEDICATIONS: No current outpatient medications on file. (Ophthalmic Drugs)   No current facility-administered medications for this visit.  (Ophthalmic Drugs)   Current Outpatient Medications (Other)  Medication Sig  . aspirin 81 MG chewable tablet Chew 1 tablet (81 mg total) by mouth daily.  . betamethasone dipropionate (DIPROLENE) 0.05 % cream Apply 1 application topically 2 (two) times daily.  . cephALEXin (KEFLEX) 500 MG capsule TAKE 1 CAPSULE BY MOUTH TWICE A DAY FOR 10 DAYS  . clindamycin (CLEOCIN) 300 MG capsule Take 300 mg by mouth 3 (three) times daily.  . clopidogrel (PLAVIX) 75 MG tablet Take 1 tablet (75 mg total) by mouth daily with breakfast.  . Continuous Blood Gluc Receiver (FREESTYLE LIBRE  14 DAY READER) DEVI daily. as directed  . Continuous Blood Gluc Sensor (FREESTYLE LIBRE 14 DAY SENSOR) MISC USE AS DIRECTED EVERY 14 DAYS  . FARXIGA 5 MG TABS tablet Take 5 mg by mouth daily.  . fenofibrate 160 MG tablet Take 160 mg by mouth daily.   . furosemide (LASIX) 40 MG tablet Take 1 tablet (40 mg total) by mouth daily.  . Insulin Human (INSULIN PUMP) SOLN Inject into the skin.  Marland Kitchen insulin regular human CONCENTRATED (HUMULIN R) 500 UNIT/ML SOLN injection Inject into the skin. Using insulin pump  . isosorbide mononitrate (IMDUR) 30 MG 24 hr tablet Take 0.5 tablets (15 mg total) by mouth daily.  . metFORMIN (GLUCOPHAGE) 1000 MG tablet Take 1,000 mg by mouth 2 (two) times daily with a meal.  . metoprolol succinate (TOPROL-XL) 50 MG 24 hr tablet TAKE 1 TABLET EVERY DAY WITH OR IMMEDIATELY  . nitroGLYCERIN (NITROSTAT) 0.4 MG SL tablet Place 1 tablet (0.4 mg total) under the tongue every 5 (five) minutes as needed.  . Omega-3 Fatty Acids (FISH OIL) 1000 MG CAPS Take 6,000 mg by mouth 2 (two) times daily.   . ramipril (ALTACE) 5 MG capsule Take 5 mg by mouth daily.  . simvastatin (ZOCOR) 10 MG tablet TAKE 1 TABLET(10 MG) BY MOUTH DAILY   No current facility-administered medications for this visit.  (  Other)      REVIEW OF SYSTEMS: ROS    Positive for: Endocrine, Cardiovascular, Eyes   Negative for: Constitutional, Gastrointestinal, Neurological, Skin, Genitourinary, Musculoskeletal, HENT, Respiratory, Psychiatric, Allergic/Imm, Heme/Lymph   Last edited by Debbrah Alar, COT on 05/22/2017  1:21 PM. (History)       ALLERGIES Allergies  Allergen Reactions  . Reglan [Metoclopramide] Other (See Comments)    anxiety    PAST MEDICAL HISTORY Past Medical History:  Diagnosis Date  . Abnormal cardiovascular stress test 02/06/2013  . Anemia    occasional - no problems currently(10/02/2011)  . Bilateral renal cysts 06/16/2011   states no known problems  . Blood transfusion without  reported diagnosis   . CAD (coronary artery disease), LAD 90%, 1st diag 95%, LCX 70% wth AV groove 90%, RCA 40-50% mid and 80% long distal stenosis 02/03/13 02/04/2013  . Chest pain    positive Myoview stress test  . Coronary artery calcification seen on CAT scan   . Diabetes mellitus    IDDM  . Fatty liver disease, nonalcoholic 5/57/3220  . Hyperlipidemia   . Hypertension    states no dx. of HTN, takes med. to protect kidneys due to DM  . Lateral meniscus tear 09/2011   left  . Loose body in knee 09/2011   loose bodies left knee  . Non Hodgkin's lymphoma ([place]) 1991  . Pancreatitis    occasional - last episode 06/2011  . S/P CABG x 4, 02/05/13 LIMA-LAD; LT. RADIAL-OM;VG-DIAG; VG-PDA 02/06/2013   10/18 3/4 patent grafts (occluded SVG-->Diag), PCI/DESx 3 SVG-->RCA, normal EF  . Splenomegaly, congestive, chronic   . Stuffy and runny nose 10/02/2011   yellow drainage from nose   Past Surgical History:  Procedure Laterality Date  . CORONARY ARTERY BYPASS GRAFT N/A 02/05/2013   Procedure: CORONARY ARTERY BYPASS GRAFTING (CABG);  Surgeon: Ivin Poot, MD;  Location: [place];  Service: Open Heart Surgery;  Laterality: N/A;  Coronary artery bypass graft on pump times four using left internal mammary artery and right greater saphenous vein via endovein harvest and left radial artery harvest.   . CORONARY STENT INTERVENTION  12/17/2016    PCI and drug-eluting stenting of the mid and distal RCA SVG   . CORONARY STENT INTERVENTION N/A 12/17/2016   Procedure: CORONARY STENT INTERVENTION;  Surgeon: Lorretta Harp, MD;  Location: [place] [facility];  Service: Cardiovascular;  Laterality: N/A;  . ELBOW SURGERY    . HERNIA REPAIR     inguinal   . INTRAOPERATIVE TRANSESOPHAGEAL ECHOCARDIOGRAM N/A 02/05/2013   Procedure: INTRAOPERATIVE TRANSESOPHAGEAL ECHOCARDIOGRAM;  Surgeon: Ivin Poot, MD;  Location: [place];  Service: Open Heart Surgery;  Laterality: N/A;  . KNEE ARTHROSCOPY  10/09/2011    Procedure: ARTHROSCOPY KNEE;  Surgeon: Nita Sells, MD;  Location: [place];  Service: Orthopedics;  Laterality: Left;  . LEFT HEART CATH AND CORS/GRAFTS ANGIOGRAPHY N/A 12/17/2016   Procedure: LEFT HEART CATH AND CORS/GRAFTS ANGIOGRAPHY;  Surgeon: Lorretta Harp, MD;  Location: [place] [facility];  Service: Cardiovascular;  Laterality: N/A;  . LEFT HEART CATHETERIZATION WITH CORONARY ANGIOGRAM N/A 02/03/2013   Procedure: LEFT HEART CATHETERIZATION WITH CORONARY ANGIOGRAM;  Surgeon: Lorretta Harp, MD;  Location: [facility] CATH LAB;  Service: Cardiovascular;  Laterality: N/A;  . NASAL SEPTUM SURGERY    . RADIAL ARTERY HARVEST Left 02/05/2013   Procedure: RADIAL ARTERY HARVEST;  Surgeon: Ivin Poot, MD;  Location: [place];  Service: Vascular;  Laterality:  Left;  . TIBIA BONE BIOPSY  x 3   left  . TONSILLECTOMY      FAMILY HISTORY Family History  Adopted: Yes    SOCIAL HISTORY Social History   Tobacco Use  . Smoking status: Never Smoker  . Smokeless tobacco: Never Used  Substance Use Topics  . Alcohol use: No  . Drug use: No         OPHTHALMIC EXAM:  Base Eye Exam    Visual Acuity (Snellen - Linear)      Right Left   Dist cc 20/20 -1 20/20 -2   Dist ph cc NI 20/20 -1   Correction:  Glasses       Tonometry (Tonopen, 1:27 PM)      Right Left   Pressure 14 11       Pupils      Dark Light Shape React APD   Right 4 3 Round Slow None   Left 4 3 Round Slow None       Visual Fields (Counting fingers)      Left Right    Full Full       Extraocular Movement      Right Left    Full, Ortho Full, Ortho       Neuro/Psych    Oriented x3:  Yes   Mood/Affect:  Normal       Dilation    Both eyes:  1.0% Mydriacyl, 2.5% Phenylephrine @ 1:27 PM        Slit Lamp and Fundus Exam    Slit Lamp Exam      Right Left   Lids/Lashes Normal Normal   Conjunctiva/Sclera White and quiet White and quiet   Cornea Inferior-temporal K scar, 1+  Punctate epithelial erosions, mild Arcus 1+ Punctate epithelial erosions, mild Arcus, scattered peripheral areas of corneal haze / mild scarring   Anterior Chamber Deep and quiet Deep and quiet   Iris Round and dilated, No NVI Round and dilated, No NVI   Lens 1-2+ Nuclear sclerosis, 1-2+ Cortical cataract 1-2+ Nuclear sclerosis, 1-2+ Cortical cataract   Vitreous Vitreous syneresis Vitreous syneresis       Fundus Exam      Right Left   Disc sharp, No NVD Normal, No NVD   C/D Ratio 0.25 0.35   Macula Good foveal reflex, few Microaneurysms, No edema Good foveal reflex, few Microaneurysms, no edema, small flame hemorrhage along inferior arcades   Vessels Mild Copper wiring, mild AV crossing changes Mild Copper wiring, mild AV crossing changes   Periphery Attached, scattered punctate RPE changes, focal area of exudation just above superior arcade 0.5 DD in size Attached, exudates along inferior arcade, white centered hemorrhage above the supratemporal arcade, dot hem inferior to inferior arcade, focal area of exudation off the 0800 nerve with central hemorrhage, focal hemorrhage with surrounding exudation at 0100 1.5 DD from disc          IMAGING AND PROCEDURES  Imaging and Procedures for 05/23/17  OCT, Retina - OU - Both Eyes     Right Eye Quality was good. Central Foveal Thickness: 288. Progression has been stable. Findings include normal foveal contour, no IRF, no SRF.   Left Eye Quality was good. Central Foveal Thickness: 294. Progression has been stable. Findings include normal foveal contour, no IRF, no SRF, vitreomacular adhesion .   Notes *Images captured and stored on drive  Diagnosis / Impression:  NFP, No IRF/SRF VMA OS  Clinical management:  See below  Abbreviations:  NFP - Normal foveal profile. CME - cystoid macular edema. PED - pigment epithelial detachment. IRF - intraretinal fluid. SRF - subretinal fluid. EZ - ellipsoid zone. ERM - epiretinal membrane. ORA - outer  retinal atrophy. ORT - outer retinal tubulation. SRHM - subretinal hyper-reflective material                  ASSESSMENT/PLAN:    ICD-10-CM   1. 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 Moderate nonproliferative diabetic retinopathy of both eyes without macular edema associated with type 2 diabetes mellitus ([place]) Z61.0960   3. Essential hypertension I10   4. Hypertensive retinopathy of both eyes H35.033   5. Retinal edema H35.81 OCT, Retina - OU - Both Eyes  6. Combined forms of age-related cataract of both eyes H25.813     1,2. DMII w/ nonproliferative diabetic retinopathy w/o CSME, both eyes - The incidence, risk factors for progression, natural history and treatment options for diabetic retinopathy were discussed with patient.   - The need for close monitoring of blood glucose, blood pressure, and serum lipids, avoiding cigarette or any type of tobacco, and the need for long term follow up was also discussed with patient. - exam shows focal areas of hemorrhage and exudation OU -- no significant change from prior - FA on  showed non central, focal areas of hemorrhage and leakage - OCT without central diabetic macular edema, both eyes, but focal areas of retinal edema / thickening corresponding to peripheral / noncentral areas of hemorrhage and exudation - no intervention indicated at this time, but discussed treatment options should disease progress to central or macular edema - monitor for now and encouraged tight BG and BP control - f/u in 4 mos with wide field OCT and repeat FA (OS transit)  3,4. Hypertensive retinopathy OU - discussed importance of tight BP control - monitor  5. Peripheral retinal edema noted on exam and OCT as described above  6. Combined form age-related cataract OU-  - The symptoms of cataract, surgical options, and treatments and risks were discussed with patient. - discussed diagnosis and progression - not yet visually  significant - monitor for now   Ophthalmic Meds Ordered this visit:  No orders of the defined types were placed in this encounter.      Return in about 4 months (around 09/21/2017) for F/U NPDR OU .  There are no Patient Instructions on file for this visit.   Explained the diagnoses, plan, and follow up with the patient and they expressed understanding.  Patient expressed understanding of the importance of proper follow up care.   This document serves as a record of services personally performed by Gardiner Sleeper, MD, PhD. It was created on their behalf by Catha Brow, [place], a certified ophthalmic assistant. The creation of this record is the provider's dictation and/or activities during the visit.  Electronically signed by: Catha Brow, [place]  05/23/17 10:49 PM   Gardiner Sleeper, M.D., Ph.D. Diseases & Surgery of the Retina and [place] 05/23/17   I have reviewed the above documentation for accuracy and completeness, and I agree with the above. Gardiner Sleeper, M.D., Ph.D. 05/23/17 10:51 PM    Abbreviations: M myopia (nearsighted); A astigmatism; H hyperopia (farsighted); P presbyopia; Mrx spectacle prescription;  CTL contact lenses; OD right eye; OS left eye; OU both eyes  XT exotropia; ET esotropia; PEK punctate epithelial keratitis; PEE punctate epithelial erosions; DES dry  eye syndrome; MGD meibomian gland dysfunction; ATs artificial tears; PFAT's preservative free artificial tears; [place] nuclear sclerotic cataract; PSC posterior subcapsular cataract; ERM epi-retinal membrane; PVD posterior vitreous detachment; RD retinal detachment; DM diabetes mellitus; DR diabetic retinopathy; NPDR non-proliferative diabetic retinopathy; PDR proliferative diabetic retinopathy; CSME clinically significant macular edema; DME diabetic macular edema; dbh dot blot hemorrhages; CWS cotton wool spot; POAG primary open angle glaucoma; C/D cup-to-disc ratio; HVF  humphrey visual field; GVF goldmann visual field; OCT optical coherence tomography; IOP intraocular pressure; BRVO Branch retinal vein occlusion; CRVO central retinal vein occlusion; CRAO central retinal artery occlusion; BRAO branch retinal artery occlusion; RT retinal tear; SB scleral buckle; PPV pars plana vitrectomy; VH Vitreous hemorrhage; PRP panretinal laser photocoagulation; IVK intravitreal kenalog; VMT vitreomacular traction; MH Macular hole;  NVD neovascularization of the disc; NVE neovascularization elsewhere; AREDS age related eye disease study; ARMD age related macular degeneration; POAG primary open angle glaucoma; EBMD epithelial/anterior basement membrane dystrophy; ACIOL anterior chamber intraocular lens; IOL intraocular lens; PCIOL posterior chamber intraocular lens; Phaco/IOL phacoemulsification with intraocular lens placement; [place] photorefractive keratectomy; LASIK laser assisted in situ keratomileusis; HTN hypertension; DM diabetes mellitus; COPD chronic obstructive pulmonary disease

## 2017-05-22 ENCOUNTER — Encounter (INDEPENDENT_AMBULATORY_CARE_PROVIDER_SITE_OTHER): Payer: Self-pay | Admitting: Ophthalmology

## 2017-05-22 ENCOUNTER — Ambulatory Visit (INDEPENDENT_AMBULATORY_CARE_PROVIDER_SITE_OTHER): Payer: Commercial Managed Care - PPO | Admitting: Ophthalmology

## 2017-05-22 DIAGNOSIS — H35049 Retinal micro-aneurysms, unspecified, unspecified eye: Secondary | ICD-10-CM

## 2017-05-22 DIAGNOSIS — E113393 Type 2 diabetes mellitus with moderate nonproliferative diabetic retinopathy without macular edema, bilateral: Secondary | ICD-10-CM

## 2017-05-22 DIAGNOSIS — E11319 Type 2 diabetes mellitus with unspecified diabetic retinopathy without macular edema: Secondary | ICD-10-CM

## 2017-05-22 DIAGNOSIS — H35033 Hypertensive retinopathy, bilateral: Secondary | ICD-10-CM

## 2017-05-22 DIAGNOSIS — H3581 Retinal edema: Secondary | ICD-10-CM

## 2017-05-22 DIAGNOSIS — I10 Essential (primary) hypertension: Secondary | ICD-10-CM

## 2017-05-22 DIAGNOSIS — H25813 Combined forms of age-related cataract, bilateral: Secondary | ICD-10-CM

## 2017-05-23 ENCOUNTER — Encounter (INDEPENDENT_AMBULATORY_CARE_PROVIDER_SITE_OTHER): Payer: Self-pay | Admitting: Ophthalmology

## 2017-06-03 ENCOUNTER — Other Ambulatory Visit: Payer: Self-pay | Admitting: Cardiovascular Disease

## 2017-06-03 NOTE — Telephone Encounter (Signed)
REFILL 

## 2017-07-02 ENCOUNTER — Ambulatory Visit (HOSPITAL_COMMUNITY)
Admission: RE | Admit: 2017-07-02 | Discharge: 2017-07-02 | Disposition: A | Discharge status: Home / Self Care | Payer: Commercial Managed Care - PPO | Source: Ambulatory Visit | Attending: Vascular Surgery | Admitting: Vascular Surgery

## 2017-07-02 ENCOUNTER — Other Ambulatory Visit (HOSPITAL_COMMUNITY): Payer: Self-pay | Admitting: Internal Medicine

## 2017-07-02 DIAGNOSIS — L0390 Cellulitis, unspecified: Secondary | ICD-10-CM | POA: Diagnosis not present

## 2017-07-02 DIAGNOSIS — M7989 Other specified soft tissue disorders: Secondary | ICD-10-CM

## 2017-09-18 NOTE — Progress Notes (Signed)
[facility] Note  09/23/2017     CHIEF COMPLAINT Patient presents for Retina Follow Up   HISTORY OF PRESENT ILLNESS: Juan [name] is a 52 y.o. male who presents to the clinic today for:   HPI    Retina Follow Up    Patient presents with  Diabetic Retinopathy.  In both eyes.  Severity is moderate.  Duration of 4 months.  Since onset it is stable.  I, the attending physician,  performed the HPI with the patient and updated documentation appropriately.          Comments    Pt presents for NPDR OU F/U, pt states VA has been fine since last visit, pt states he has the same floaters he has always had, but denies flashes, pain, or wavy vision, pt has an appt next week to check A1C, pt did not check BS this morning, but it was 159 last night after he ate       Last edited by Bernarda Caffey, MD on 09/23/2017  8:19 AM. (History)     Pt states there has been minimal change in OS VA; Pt states BP has been well controlled; Pt states CBG has not been well controlled due to expense; Pt endorses being on Plavix x 1 year for 3 coronary stents;   Referring physician: No referring provider defined for this encounter.  HISTORICAL INFORMATION:   Selected notes from the MEDICAL RECORD NUMBER Referred by Dr. [name] for DM exam LEE-  Ocular Hx-  PMH- type II DM, HTN, hyperlipidemia, hypertriglyceridemia, anemia, non hodgekins lymphoma, sleep apnea; last A1C 8.0    CURRENT MEDICATIONS: No current outpatient medications on file. (Ophthalmic Drugs)   No current facility-administered medications for this visit.  (Ophthalmic Drugs)   Current Outpatient Medications (Other)  Medication Sig  . aspirin 81 MG chewable tablet Chew 1 tablet (81 mg total) by mouth daily.  . betamethasone dipropionate (DIPROLENE) 0.05 % cream Apply 1 application topically 2 (two) times daily.  . cephALEXin (KEFLEX) 500 MG capsule TAKE 1 CAPSULE BY MOUTH TWICE A DAY FOR 10 DAYS  . clindamycin  (CLEOCIN) 300 MG capsule Take 300 mg by mouth 3 (three) times daily.  . clopidogrel (PLAVIX) 75 MG tablet Take 1 tablet (75 mg total) by mouth daily with breakfast.  . Continuous Blood Gluc Receiver (FREESTYLE LIBRE 14 DAY READER) DEVI daily. as directed  . Continuous Blood Gluc Sensor (FREESTYLE LIBRE 14 DAY SENSOR) MISC USE AS DIRECTED EVERY 14 DAYS  . FARXIGA 5 MG TABS tablet Take 5 mg by mouth daily.  . fenofibrate 160 MG tablet Take 160 mg by mouth daily.   . furosemide (LASIX) 40 MG tablet Take 1 tablet (40 mg total) by mouth daily.  . Insulin Human (INSULIN PUMP) SOLN Inject into the skin.  Marland Kitchen insulin regular human CONCENTRATED (HUMULIN R) 500 UNIT/ML SOLN injection Inject into the skin. Using insulin pump  . isosorbide mononitrate (IMDUR) 30 MG 24 hr tablet Take 0.5 tablets (15 mg total) by mouth daily.  . metFORMIN (GLUCOPHAGE) 1000 MG tablet Take 1,000 mg by mouth 2 (two) times daily with a meal.  . metoprolol succinate (TOPROL-XL) 50 MG 24 hr tablet TAKE 1 TABLET EVERY DAY WITH OR IMMEDIATELY  . nitroGLYCERIN (NITROSTAT) 0.4 MG SL tablet Place 1 tablet (0.4 mg total) under the tongue every 5 (five) minutes as needed.  . Omega-3 Fatty Acids (FISH OIL) 1000 MG CAPS Take 6,000 mg by mouth  2 (two) times daily.   . ramipril (ALTACE) 5 MG capsule Take 5 mg by mouth daily.  . simvastatin (ZOCOR) 10 MG tablet TAKE 1 TABLET EVERY DAY   No current facility-administered medications for this visit.  (Other)      REVIEW OF SYSTEMS: ROS    Positive for: Endocrine, Cardiovascular, Eyes   Negative for: Constitutional, Gastrointestinal, Neurological, Skin, Genitourinary, Musculoskeletal, HENT, Respiratory, Psychiatric, Allergic/Imm, Heme/Lymph   Last edited by Debbrah Alar, COT on 09/23/2017  8:07 AM. (History)       ALLERGIES Allergies  Allergen Reactions  . Reglan [Metoclopramide] Other (See Comments)    anxiety    PAST MEDICAL HISTORY Past Medical History:  Diagnosis Date  .  Abnormal cardiovascular stress test 02/06/2013  . Anemia    occasional - no problems currently(10/02/2011)  . Bilateral renal cysts 06/16/2011   states no known problems  . Blood transfusion without reported diagnosis   . CAD (coronary artery disease), LAD 90%, 1st diag 95%, LCX 70% wth AV groove 90%, RCA 40-50% mid and 80% long distal stenosis 02/03/13 02/04/2013  . Chest pain    positive Myoview stress test  . Coronary artery calcification seen on CAT scan   . Diabetes mellitus    IDDM  . Fatty liver disease, nonalcoholic 06/25/8117  . Hyperlipidemia   . Hypertension    states no dx. of HTN, takes med. to protect kidneys due to DM  . Lateral meniscus tear 09/2011   left  . Loose body in knee 09/2011   loose bodies left knee  . Non Hodgkin's lymphoma ([place]) 1991  . Pancreatitis    occasional - last episode 06/2011  . S/P CABG x 4, 02/05/13 LIMA-LAD; LT. RADIAL-OM;VG-DIAG; VG-PDA 02/06/2013   10/18 3/4 patent grafts (occluded SVG-->Diag), PCI/DESx 3 SVG-->RCA, normal EF  . Splenomegaly, congestive, chronic   . Stuffy and runny nose 10/02/2011   yellow drainage from nose   Past Surgical History:  Procedure Laterality Date  . CORONARY ARTERY BYPASS GRAFT N/A 02/05/2013   Procedure: CORONARY ARTERY BYPASS GRAFTING (CABG);  Surgeon: Ivin Poot, MD;  Location: [place];  Service: Open Heart Surgery;  Laterality: N/A;  Coronary artery bypass graft on pump times four using left internal mammary artery and right greater saphenous vein via endovein harvest and left radial artery harvest.   . CORONARY STENT INTERVENTION  12/17/2016    PCI and drug-eluting stenting of the mid and distal RCA SVG   . CORONARY STENT INTERVENTION N/A 12/17/2016   Procedure: CORONARY STENT INTERVENTION;  Surgeon: Lorretta Harp, MD;  Location: [place] [facility];  Service: Cardiovascular;  Laterality: N/A;  . ELBOW SURGERY    . HERNIA REPAIR     inguinal   . INTRAOPERATIVE TRANSESOPHAGEAL ECHOCARDIOGRAM N/A  02/05/2013   Procedure: INTRAOPERATIVE TRANSESOPHAGEAL ECHOCARDIOGRAM;  Surgeon: Ivin Poot, MD;  Location: [place];  Service: Open Heart Surgery;  Laterality: N/A;  . KNEE ARTHROSCOPY  10/09/2011   Procedure: ARTHROSCOPY KNEE;  Surgeon: Nita Sells, MD;  Location: [place];  Service: Orthopedics;  Laterality: Left;  . LEFT HEART CATH AND CORS/GRAFTS ANGIOGRAPHY N/A 12/17/2016   Procedure: LEFT HEART CATH AND CORS/GRAFTS ANGIOGRAPHY;  Surgeon: Lorretta Harp, MD;  Location: [place] [facility];  Service: Cardiovascular;  Laterality: N/A;  . LEFT HEART CATHETERIZATION WITH CORONARY ANGIOGRAM N/A 02/03/2013   Procedure: LEFT HEART CATHETERIZATION WITH CORONARY ANGIOGRAM;  Surgeon: Lorretta Harp, MD;  [facility];  Service: Cardiovascular;  Laterality: N/A;  . NASAL SEPTUM SURGERY    . RADIAL ARTERY HARVEST Left 02/05/2013   Procedure: RADIAL ARTERY HARVEST;  Surgeon: Ivin Poot, MD;  Location: [place];  Service: Vascular;  Laterality: Left;  . TIBIA BONE BIOPSY  x 3   left  . TONSILLECTOMY      FAMILY HISTORY Family History  Adopted: Yes    SOCIAL HISTORY Social History   Tobacco Use  . Smoking status: Never Smoker  . Smokeless tobacco: Never Used  Substance Use Topics  . Alcohol use: No  . Drug use: No         OPHTHALMIC EXAM:  Base Eye Exam    Visual Acuity (Snellen - Linear)      Right Left   Dist [place] 20/30 20/30 +2   Dist ph [place] 20/20 -1 20/25 +1   Correction:  Glasses       Tonometry (Tonopen, 8:14 AM)      Right Left   Pressure 13 12       Pupils      Dark Light Shape React APD   Right 5 3 Round Brisk None   Left 5 3 Round Brisk None       Visual Fields (Counting fingers)      Left Right    Full Full       Extraocular Movement      Right Left    Full, Ortho Full, Ortho       Neuro/Psych    Oriented x3:  Yes   Mood/Affect:  Normal       Dilation    Both eyes:  1.0% Mydriacyl, 2.5% Phenylephrine @  8:14 AM        Slit Lamp and Fundus Exam    Slit Lamp Exam      Right Left   Lids/Lashes Normal Normal   Conjunctiva/Sclera White and quiet White and quiet   Cornea Inferior-temporal K scar, 1+ Punctate epithelial erosions, mild Arcus 1+ Punctate epithelial erosions, mild Arcus, scattered peripheral areas of corneal haze / mild scarring   Anterior Chamber Deep and quiet Deep and quiet   Iris Round and dilated, No NVI Round and dilated, No NVI   Lens 1-2+ Nuclear sclerosis, 1-2+ Cortical cataract 1-2+ Nuclear sclerosis, 1-2+ Cortical cataract   Vitreous Vitreous syneresis Vitreous syneresis       Fundus Exam      Right Left   Disc sharp, No NVD Normal, No NVD   C/D Ratio 0.3 0.35   Macula Good foveal reflex, few Microaneurysms, No edema, mild Retinal pigment epithelial mottling Good foveal reflex, few Microaneurysms, no edema, small flame hemorrhage along inferior arcades   Vessels Mild Copper wiring, mild AV crossing changes Mild Copper wiring, mild AV crossing changes   Periphery Attached, scattered punctate RPE changes, focal area of exudation just above superior arcade 0.5 DD in size Attached, exudates along inferior arcade, white centered hemorrhage above the supratemporal arcade, dot heme inferior to inferior arcade, focal area of exudation off the 0800 nerve with central hemorrhage, focal hemorrhageexudation at 0100 1.5 DD from disc -- improved from prior with surrounding exudation at 0100 1.5 DD from disc -- improved from prior          IMAGING AND PROCEDURES  Imaging and Procedures for 05/23/17  OCT, Retina - OU - Both Eyes       Right Eye Quality was good. Central Foveal Thickness: 290. Progression has been stable. Findings include  normal foveal contour, no IRF, no SRF.   Left Eye Quality was good. Central Foveal Thickness: 290. Progression has been stable. Findings include normal foveal contour, no IRF, no SRF, vitreomacular adhesion .   Notes *Images captured and  stored on drive  Diagnosis / Impression:  NFP, No IRF/SRF VMA OS  Clinical management:  See below  Abbreviations: NFP - Normal foveal profile. CME - cystoid macular edema. PED - pigment epithelial detachment. IRF - intraretinal fluid. SRF - subretinal fluid. EZ - ellipsoid zone. ERM - epiretinal membrane. ORA - outer retinal atrophy. ORT - outer retinal tubulation. SRHM - subretinal hyper-reflective material         Fluorescein Angiography Optos (Transit OS)       Right Eye Progression has been stable. Early phase findings include microaneurysm, leakage. Mid/Late phase findings include leakage, microaneurysm.   Left Eye Progression has improved. Early phase findings include microaneurysm, blockage. Mid/Late phase findings include microaneurysm, leakage.   Notes *Images captured and stored on drive.  Impression:  Low fluorescein signal OD - 360 peripheral MAs with late leakage; focal area of hyperfluorescent leakage above ST arcade, No NV OS - 360 peripheral Mas with late leakage; focal areas of hyperfluorescent leakage at 1100 and 0800 about the disc, No NV                ASSESSMENT/PLAN:    ICD-10-CM   1. 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 Moderate nonproliferative diabetic retinopathy of both eyes without macular edema associated with type 2 diabetes mellitus ([place]) E42.3536   3. Essential hypertension I10   4. Hypertensive retinopathy of both eyes H35.033   5. Retinal edema H35.81 OCT, Retina - OU - Both Eyes    Fluorescein Angiography Optos (Transit OS)  6. Combined forms of age-related cataract of both eyes H25.813     1,2. DMII w/ nonproliferative diabetic retinopathy w/o CSME, both eyes - The incidence, risk factors for progression, natural history and treatment options for diabetic retinopathy were discussed with patient.   - The need for close monitoring of  blood glucose, blood pressure, and serum lipids, avoiding cigarette or any type of tobacco, and the need for long term follow up was also discussed with patient. - exam shows focal areas of hemorrhage and exudation OU -- mild improvement from prior but perisstent - FA on  showed non central, focal areas of hemorrhage and leakage - OCT without central diabetic macular edema, both eyes, but focal areas of retinal edema / thickening corresponding to peripheral / noncentral areas of hemorrhage and exudation - monitor for now and encouraged tight BG and BP control - pt reports history of CAD with stents placed Oct 2018, currently on plavix for 1 year -- possibly contributing to pathology. - discussed treatment plans if no improvement next visit - possible laser vs possible injection - f/u in 2-3 months with wide field OCT and repeat FA (OS transit) -- possible laser OS  3,4. Hypertensive retinopathy OU - discussed importance of tight BP control - monitor  5. Peripheral retinal edema noted on exam and OCT as described above  6. Combined form age-related cataract OU-  - The symptoms of cataract, surgical options, and treatments and risks were discussed with patient. - discussed diagnosis and progression - not yet visually significant - monitor for now   Ophthalmic Meds Ordered this visit:  No orders of the defined types  were placed in this encounter.      Return for 2-3 mos, DFE/OCT/FA  transit OS, possible laser OS.  There are no Patient Instructions on file for this visit.   Explained the diagnoses, plan, and follow up with the patient and they expressed understanding.  Patient expressed understanding of the importance of proper follow up care.   This document serves as a record of services personally performed by Gardiner Sleeper, MD, PhD. It was created on their behalf by Catha Brow, [place], a certified ophthalmic assistant. The creation of this record is the provider's dictation  and/or activities during the visit.  Electronically signed by: Catha Brow, COA  07.03.19 12:22 PM   Gardiner Sleeper, M.D., Ph.D. Diseases & Surgery of the Retina and Vitreous [facility]   I have reviewed the above documentation for accuracy and completeness, and I agree with the above. Gardiner Sleeper, M.D., Ph.D. 09/23/17 12:33 PM   Abbreviations: M myopia (nearsighted); A astigmatism; H hyperopia (farsighted); P presbyopia; Mrx spectacle prescription;  CTL contact lenses; OD right eye; OS left eye; OU both eyes  XT exotropia; ET esotropia; PEK punctate epithelial keratitis; PEE punctate epithelial erosions; DES dry eye syndrome; MGD meibomian gland dysfunction; ATs artificial tears; PFAT's preservative free artificial tears; [place] nuclear sclerotic cataract; PSC posterior subcapsular cataract; ERM epi-retinal membrane; PVD posterior vitreous detachment; RD retinal detachment; DM diabetes mellitus; DR diabetic retinopathy; NPDR non-proliferative diabetic retinopathy; PDR proliferative diabetic retinopathy; CSME clinically significant macular edema; DME diabetic macular edema; dbh dot blot hemorrhages; CWS cotton wool spot; POAG primary open angle glaucoma; C/D cup-to-disc ratio; HVF humphrey visual field; GVF goldmann visual field; OCT optical coherence tomography; IOP intraocular pressure; BRVO Branch retinal vein occlusion; CRVO central retinal vein occlusion; CRAO central retinal artery occlusion; BRAO branch retinal artery occlusion; RT retinal tear; SB scleral buckle; PPV pars plana vitrectomy; VH Vitreous hemorrhage; PRP panretinal laser photocoagulation; IVK intravitreal kenalog; VMT vitreomacular traction; MH Macular hole;  NVD neovascularization of the disc; NVE neovascularization elsewhere; AREDS age related eye disease study; ARMD age related macular degeneration; POAG primary open angle glaucoma; EBMD epithelial/anterior basement membrane dystrophy; ACIOL  anterior chamber intraocular lens; IOL intraocular lens; PCIOL posterior chamber intraocular lens; Phaco/IOL phacoemulsification with intraocular lens placement; [place] photorefractive keratectomy; LASIK laser assisted in situ keratomileusis; HTN hypertension; DM diabetes mellitus; COPD chronic obstructive pulmonary disease

## 2017-09-23 ENCOUNTER — Ambulatory Visit (INDEPENDENT_AMBULATORY_CARE_PROVIDER_SITE_OTHER): Payer: Commercial Managed Care - PPO | Admitting: Ophthalmology

## 2017-09-23 ENCOUNTER — Encounter (INDEPENDENT_AMBULATORY_CARE_PROVIDER_SITE_OTHER): Payer: Self-pay | Admitting: Ophthalmology

## 2017-09-23 DIAGNOSIS — E11319 Type 2 diabetes mellitus with unspecified diabetic retinopathy without macular edema: Secondary | ICD-10-CM

## 2017-09-23 DIAGNOSIS — H3581 Retinal edema: Secondary | ICD-10-CM | POA: Diagnosis not present

## 2017-09-23 DIAGNOSIS — E113393 Type 2 diabetes mellitus with moderate nonproliferative diabetic retinopathy without macular edema, bilateral: Secondary | ICD-10-CM

## 2017-09-23 DIAGNOSIS — H25813 Combined forms of age-related cataract, bilateral: Secondary | ICD-10-CM

## 2017-09-23 DIAGNOSIS — I10 Essential (primary) hypertension: Secondary | ICD-10-CM | POA: Diagnosis not present

## 2017-09-23 DIAGNOSIS — H35049 Retinal micro-aneurysms, unspecified, unspecified eye: Secondary | ICD-10-CM | POA: Diagnosis not present

## 2017-09-23 DIAGNOSIS — H35033 Hypertensive retinopathy, bilateral: Secondary | ICD-10-CM

## 2017-10-09 ENCOUNTER — Other Ambulatory Visit: Payer: Self-pay | Admitting: Cardiovascular Disease

## 2017-11-27 ENCOUNTER — Other Ambulatory Visit: Payer: Self-pay

## 2017-11-27 ENCOUNTER — Telehealth: Payer: Self-pay | Admitting: Cardiovascular Disease

## 2017-11-27 MED ORDER — FUROSEMIDE 40 MG PO TABS: 40.0000 mg | ORAL_TABLET | Freq: Every day | ORAL | 0 refills | Status: DC

## 2017-11-27 NOTE — Telephone Encounter (Signed)
New Message   Pt c/o medication issue:  1. Name of Medication: furosemide (LASIX) 40 MG tablet  2. How are you currently taking this medication (dosage and times per day)?   3. Are you having a reaction (difficulty breathing--STAT)?   4. What is your medication issue? Patients wife is upset and is wanting an explanation as to why they can not get a 90 day on the furosemide. Please call to discuss.

## 2017-11-27 NOTE — Telephone Encounter (Signed)
Called patient and advised that a 90 day could be submitted today, but that an appointment needed to be made. Patient wife voiced understanding and would have husband call to make appointment.

## 2017-11-27 NOTE — Telephone Encounter (Signed)
Called patient wife and advised of the medication issue. I advised I could send a 90 day supply, but patient was over due for an appointment he was due in July. Patient wife stated that she would let him do that so he could schedule when appropriate for him.  Wife verbalized understanding.

## 2017-11-29 NOTE — Progress Notes (Addendum)
[facility] Note  12/02/2017     CHIEF COMPLAINT Patient presents for Retina Follow Up   HISTORY OF PRESENT ILLNESS: Juan [name] is a 52 y.o. male who presents to the clinic today for:   HPI    Retina Follow Up    Patient presents with  Diabetic Retinopathy.  In both eyes.  Severity is moderate.  Duration of 2 months.  Since onset it is stable.  I, the attending physician,  performed the HPI with the patient and updated documentation appropriately.          Comments    F/U NPDR OU; Pt states OU VA is stable; Pt states OU are "itchy due to allergies"; Pt reports CBG has been elevated since last visit; Pt states CBG was 212 this AM; Pt states last A1C was 8.8 x 3-4 weeks ago; Pt denies floaters, denies flashes, denies wavy VA;        Last edited by Bernarda Caffey, MD on 12/02/2017  9:57 AM. (History)     Pt states there has been minimal change in OS VA; Pt states BP has been well controlled; Pt states CBG has not been well controlled due to expense; Pt endorses being on Plavix x 1   Referring physician: No referring provider defined for this encounter.  HISTORICAL INFORMATION:   Selected notes from the MEDICAL RECORD NUMBER Referred by Dr. [name] for DM exam LEE-  Ocular Hx-  PMH- type II DM, HTN, hyperlipidemia, hypertriglyceridemia, anemia, non hodgekins lymphoma, sleep apnea; last A1C 8.0    CURRENT MEDICATIONS: Current Outpatient Medications (Ophthalmic Drugs)  Medication Sig  . prednisoLONE acetate (PRED FORTE) 1 % ophthalmic suspension Place 1 drop into the left eye 4 (four) times daily for 7 days.   No current facility-administered medications for this visit.  (Ophthalmic Drugs)   Current Outpatient Medications (Other)  Medication Sig  . aspirin 81 MG chewable tablet Chew 1 tablet (81 mg total) by mouth daily.  . betamethasone dipropionate (DIPROLENE) 0.05 % cream Apply 1 application topically 2 (two) times daily.  . cephALEXin  (KEFLEX) 500 MG capsule TAKE 1 CAPSULE BY MOUTH TWICE A DAY FOR 10 DAYS  . clindamycin (CLEOCIN) 300 MG capsule Take 300 mg by mouth 3 (three) times daily.  . clopidogrel (PLAVIX) 75 MG tablet Take 1 tablet (75 mg total) by mouth daily with breakfast.  . Continuous Blood Gluc Receiver (FREESTYLE LIBRE 14 DAY READER) DEVI daily. as directed  . Continuous Blood Gluc Sensor (FREESTYLE LIBRE 14 DAY SENSOR) MISC USE AS DIRECTED EVERY 14 DAYS  . FARXIGA 5 MG TABS tablet Take 5 mg by mouth daily.  . fenofibrate 160 MG tablet Take 160 mg by mouth daily.   . furosemide (LASIX) 40 MG tablet Take 1 tablet (40 mg total) by mouth daily.  . Insulin Human (INSULIN PUMP) SOLN Inject into the skin.  Marland Kitchen insulin regular human CONCENTRATED (HUMULIN R) 500 UNIT/ML SOLN injection Inject into the skin. Using insulin pump  . isosorbide mononitrate (IMDUR) 30 MG 24 hr tablet Take 0.5 tablets (15 mg total) by mouth daily.  . metFORMIN (GLUCOPHAGE) 1000 MG tablet Take 1,000 mg by mouth 2 (two) times daily with a meal.  . metoprolol succinate (TOPROL-XL) 50 MG 24 hr tablet TAKE 1 TABLET EVERY DAY WITH OR IMMEDIATELY  . nitroGLYCERIN (NITROSTAT) 0.4 MG SL tablet Place 1 tablet (0.4 mg total) under the tongue every 5 (five) minutes as needed.  Marland Kitchen  Omega-3 Fatty Acids (FISH OIL) 1000 MG CAPS Take 6,000 mg by mouth 2 (two) times daily.   . ramipril (ALTACE) 5 MG capsule Take 5 mg by mouth daily.  . simvastatin (ZOCOR) 10 MG tablet TAKE 1 TABLET EVERY DAY   No current facility-administered medications for this visit.  (Other)      REVIEW OF SYSTEMS: ROS    Positive for: Genitourinary, Endocrine, Cardiovascular, Eyes   Negative for: Constitutional, Gastrointestinal, Neurological, Skin, Musculoskeletal, HENT, Respiratory, Psychiatric, Allergic/Imm, Heme/Lymph   Last edited by Cherrie Gauze, COA on 12/02/2017  8:32 AM. (History)       ALLERGIES Allergies  Allergen Reactions  . Reglan [Metoclopramide] Other (See  Comments)    anxiety    PAST MEDICAL HISTORY Past Medical History:  Diagnosis Date  . Abnormal cardiovascular stress test 02/06/2013  . Anemia    occasional - no problems currently(10/02/2011)  . Bilateral renal cysts 06/16/2011   states no known problems  . Blood transfusion without reported diagnosis   . CAD (coronary artery disease), LAD 90%, 1st diag 95%, LCX 70% wth AV groove 90%, RCA 40-50% mid and 80% long distal stenosis 02/03/13 02/04/2013  . Chest pain    positive Myoview stress test  . Coronary artery calcification seen on CAT scan   . Diabetes mellitus    IDDM  . Fatty liver disease, nonalcoholic 2/35/5732  . Hyperlipidemia   . Hypertension    states no dx. of HTN, takes med. to protect kidneys due to DM  . Lateral meniscus tear 09/2011   left  . Loose body in knee 09/2011   loose bodies left knee  . Non Hodgkin's lymphoma ([place]) 1991  . Pancreatitis    occasional - last episode 06/2011  . S/P CABG x 4, 02/05/13 LIMA-LAD; LT. RADIAL-OM;VG-DIAG; VG-PDA 02/06/2013   10/18 3/4 patent grafts (occluded SVG-->Diag), PCI/DESx 3 SVG-->RCA, normal EF  . Splenomegaly, congestive, chronic   . Stuffy and runny nose 10/02/2011   yellow drainage from nose   Past Surgical History:  Procedure Laterality Date  . CORONARY ARTERY BYPASS GRAFT N/A 02/05/2013   Procedure: CORONARY ARTERY BYPASS GRAFTING (CABG);  Surgeon: Ivin Poot, MD;  Location: [facility];  Service: Open Heart Surgery;  Laterality: N/A;  Coronary artery bypass graft on pump times four using left internal mammary artery and right greater saphenous vein via endovein harvest and left radial artery harvest.   . CORONARY STENT INTERVENTION  12/17/2016    PCI and drug-eluting stenting of the mid and distal RCA SVG   . CORONARY STENT INTERVENTION N/A 12/17/2016   Procedure: CORONARY STENT INTERVENTION;  Surgeon: Lorretta Harp, MD;  Location: [facility];  Service: Cardiovascular;  Laterality: N/A;  . ELBOW SURGERY     . HERNIA REPAIR     inguinal   . herniated disc    . INTRAOPERATIVE TRANSESOPHAGEAL ECHOCARDIOGRAM N/A 02/05/2013   Procedure: INTRAOPERATIVE TRANSESOPHAGEAL ECHOCARDIOGRAM;  Surgeon: Ivin Poot, MD;  Location: [facility];  Service: Open Heart Surgery;  Laterality: N/A;  . KNEE ARTHROSCOPY  10/09/2011   Procedure: ARTHROSCOPY KNEE;  Surgeon: Nita Sells, MD;  Location: [facility];  Service: Orthopedics;  Laterality: Left;  . LEFT HEART CATH AND CORS/GRAFTS ANGIOGRAPHY N/A 12/17/2016   Procedure: LEFT HEART CATH AND CORS/GRAFTS ANGIOGRAPHY;  Surgeon: Lorretta Harp, MD;  Location: [facility];  Service: Cardiovascular;  Laterality: N/A;  . LEFT HEART CATHETERIZATION WITH CORONARY ANGIOGRAM N/A 02/03/2013  Procedure: LEFT HEART CATHETERIZATION WITH CORONARY ANGIOGRAM;  Surgeon: Lorretta Harp, MD;  Location: [facility];  Service: Cardiovascular;  Laterality: N/A;  . NASAL SEPTUM SURGERY    . RADIAL ARTERY HARVEST Left 02/05/2013   Procedure: RADIAL ARTERY HARVEST;  Surgeon: Ivin Poot, MD;  Location: [place] [facility];  Service: Vascular;  Laterality: Left;  . TIBIA BONE BIOPSY  x 3   left  . TONSILLECTOMY      FAMILY HISTORY Family History  Adopted: Yes    SOCIAL HISTORY Social History   Tobacco Use  . Smoking status: Never Smoker  . Smokeless tobacco: Never Used  Substance Use Topics  . Alcohol use: No  . Drug use: No         OPHTHALMIC EXAM:  Base Eye Exam    Visual Acuity (Snellen - Linear)      Right Left   Dist cc 20/20 20/20   Dist ph cc 20/20 20/20   Correction:  Glasses       Tonometry (Tonopen, 8:40 AM)      Right Left   Pressure 20 18       Pupils      Dark Light Shape React APD   Right 5 4 Round Brisk None   Left 5 4 Round Brisk None       Visual Fields (Counting fingers)      Left Right    Full Full       Extraocular Movement      Right Left    Full, Ortho Full, Ortho       Neuro/Psych    Oriented x3:   Yes   Mood/Affect:  Normal       Dilation    Both eyes:  1.0% Mydriacyl, 2.5% Phenylephrine @ 8:40 AM        Slit Lamp and Fundus Exam    Slit Lamp Exam      Right Left   Lids/Lashes Normal Normal   Conjunctiva/Sclera White and quiet White and quiet   Cornea Inferior-temporal K scar, 1+ Punctate epithelial erosions, mild Arcus 1+ Punctate epithelial erosions, mild Arcus, scattered peripheral areas of corneal haze / mild scarring   Anterior Chamber Deep and quiet Deep and quiet   Iris Round and dilated, No NVI Round and dilated, No NVI   Lens 1-2+ Nuclear sclerosis, 1-2+ Cortical cataract 1-2+ Nuclear sclerosis, 1-2+ Cortical cataract   Vitreous Vitreous syneresis Vitreous syneresis       Fundus Exam      Right Left   Disc sharp, No NVD Normal, No NVD   C/D Ratio 0.3 0.4   Macula Good foveal reflex, few Microaneurysms, mild Retinal pigment epithelial mottling, macroaneurysms along arcades Good foveal reflex, few Microaneurysms, small flame hemorrhage along inferior arcades, no edema   Vessels Mild Copper wiring, mild AV crossing changes Mild Copper wiring, mild AV crossing changes   Periphery Attached, scattered punctate RPE changes, focal area of exudation just above superior arcade 0.5 DD in size Attached, exudates along inferior arcade, white centered hemorrhage above the supratemporal arcade, dot heme inferior to inferior arcade, focal area of exudation off the 0800 nerve with central hemorrhage, focal hemorrhage/exudation at 0100 1.5 DD from disc -- increase in exudation at superior, inferior, and SN arcades        Refraction    Wearing Rx      Sphere Cylinder Axis Add   Right -4.00 Sphere  +1.25   Left -3.75 +0.50 060 +1.25  IMAGING AND PROCEDURES  Imaging and Procedures for 05/23/17  OCT, Retina - OU - Both Eyes       Right Eye Quality was good. Central Foveal Thickness: 295. Progression has been stable. Findings include normal foveal contour, no IRF, no  SRF.   Left Eye Quality was good. Central Foveal Thickness: 295. Progression has been stable. Findings include normal foveal contour, no IRF, no SRF, vitreomacular adhesion .   Notes *Images captured and stored on drive  Diagnosis / Impression:  NFP, No IRF/SRF VMA OS  Clinical management:  See below  Abbreviations: NFP - Normal foveal profile. CME - cystoid macular edema. PED - pigment epithelial detachment. IRF - intraretinal fluid. SRF - subretinal fluid. EZ - ellipsoid zone. ERM - epiretinal membrane. ORA - outer retinal atrophy. ORT - outer retinal tubulation. SRHM - subretinal hyper-reflective material         Fluorescein Angiography Optos (Transit OS)       Right Eye   Progression has been stable. Early phase findings include microaneurysm, leakage. Mid/Late phase findings include leakage, microaneurysm.   Left Eye   Progression has been stable. Early phase findings include microaneurysm, blockage. Mid/Late phase findings include microaneurysm, leakage.   Notes *Images captured and stored on drive.  Impression:  Low fluorescein signal OU OD - 360 peripheral MAs with late leakage; focal area of hyperfluorescent leakage above ST arcade, No NV OS - 360 peripheral Mas with late leakage; focal areas of hyperfluorescent leakage and capillary nonperfusion at 1100 and 0800 about the disc, No NV       Focal Laser - OS - Left Eye       LASER PROCEDURE NOTE  Diagnosis:   Diabetic microaneurysms and retinal edema, left eye  Procedure:  Focal laser photocoagulation using slit lamp laser, left eye  Anesthesia:  Topical  Surgeon: Bernarda Caffey, MD, PhD   Informed consent obtained, operative eye marked, and time out performed prior to initiation of laser.   Lumenis [id number] Focal/Grid laser Lens: [id number] lens Power: 280 mW Duration: 40 msec  Spot size: 50 microns  # spots: 473 spots placed to Mas and exudates supero and infero nasal to disc.  Complications:  None.  RTC: 3-4 wks  Patient tolerated the procedure well and received written and verbal post-procedure care information/education.                   ASSESSMENT/PLAN:    ICD-10-CM   1. Diabetic retinal microaneurysm (HCC) E11.319 OCT, Retina - OU - Both Eyes   H35.049 Fluorescein Angiography Optos (Transit OS)    Focal Laser - OS - Left Eye  2. Moderate nonproliferative diabetic retinopathy of both eyes without macular edema associated with type 2 diabetes mellitus (HCC) J82.5053 Focal Laser - OS - Left Eye  3. Essential hypertension I10   4. Hypertensive retinopathy of both eyes H35.033 Fluorescein Angiography Optos (Transit OS)    Focal Laser - OS - Left Eye  5. Retinal edema H35.81 OCT, Retina - OU - Both Eyes  6. Combined forms of age-related cataract of both eyes H25.813     1,2. DMII w/ nonproliferative diabetic retinopathy and noncentral diabetic retinal edema, both eyes - exam shows focal areas of hemorrhage and exudation OU -- interval increase in hemorrhage and exudation OS - FA shows non central, focal areas of late leaking MA with surrounding cap nonperfusion - OCT without central diabetic macular edema, both eyes, but focal areas of retinal edema / thickening corresponding to  peripheral / noncentral areas of hemorrhage and exudation - pt reports history of CAD with stents placed Oct 2018, currently on plavix for 1 year -- possibly contributing to pathology. - recommend focal laser OS today to target clusters MA/hemorrhage/exudation (09.16.19) - pt wishes to proceed - RBA of procedure discussed, questions answered - informed consent obtained and signed - see procedure note - f/u in 3-4 weeks with wide field OCT and color fundus photos  3,4. Hypertensive retinopathy OU - discussed importance of tight BP control - monitor  5. Peripheral retinal edema noted on exam and OCT as described above  6. Combined form age-related cataract OU-  - The symptoms of  cataract, surgical options, and treatments and risks were discussed with patient. - discussed diagnosis and progression - not yet visually significant - monitor for now   Ophthalmic Meds Ordered this visit:  Meds ordered this encounter  Medications  . prednisoLONE acetate (PRED FORTE) 1 % ophthalmic suspension    Sig: Place 1 drop into the left eye 4 (four) times daily for 7 days.    Dispense:  10 mL    Refill:  0       Return in about 4 weeks (around 12/30/2017) for F/U NPDR, DFE, OCT.  There are no Patient Instructions on file for this visit.   Explained the diagnoses, plan, and follow up with the patient and they expressed understanding.  Patient expressed understanding of the importance of proper follow up care.   This document serves as a record of services personally performed by Gardiner Sleeper, MD, PhD. It was created on their behalf by Catha Brow, [place], a certified ophthalmic assistant. The creation of this record is the provider's dictation and/or activities during the visit.  Electronically signed by: Catha Brow, [place]  09.13.19 12:07 PM   Gardiner Sleeper, M.D., Ph.D. Diseases & Surgery of the Retina and Vitreous [facility]  I have reviewed the above documentation for accuracy and completeness, and I agree with the above. Gardiner Sleeper, M.D., Ph.D. 12/02/17 12:07 PM    Abbreviations: M myopia (nearsighted); A astigmatism; H hyperopia (farsighted); P presbyopia; Mrx spectacle prescription;  CTL contact lenses; OD right eye; OS left eye; OU both eyes  XT exotropia; ET esotropia; PEK punctate epithelial keratitis; PEE punctate epithelial erosions; DES dry eye syndrome; MGD meibomian gland dysfunction; ATs artificial tears; PFAT's preservative free artificial tears; Universal nuclear sclerotic cataract; PSC posterior subcapsular cataract; ERM epi-retinal membrane; PVD posterior vitreous detachment; RD retinal detachment; DM diabetes mellitus; DR  diabetic retinopathy; NPDR non-proliferative diabetic retinopathy; PDR proliferative diabetic retinopathy; CSME clinically significant macular edema; DME diabetic macular edema; dbh dot blot hemorrhages; CWS cotton wool spot; POAG primary open angle glaucoma; C/D cup-to-disc ratio; HVF humphrey visual field; GVF goldmann visual field; OCT optical coherence tomography; IOP intraocular pressure; BRVO Branch retinal vein occlusion; CRVO central retinal vein occlusion; CRAO central retinal artery occlusion; BRAO branch retinal artery occlusion; RT retinal tear; SB scleral buckle; PPV pars plana vitrectomy; VH Vitreous hemorrhage; PRP panretinal laser photocoagulation; IVK intravitreal kenalog; VMT vitreomacular traction; MH Macular hole;  NVD neovascularization of the disc; NVE neovascularization elsewhere; AREDS age related eye disease study; ARMD age related macular degeneration; POAG primary open angle glaucoma; EBMD epithelial/anterior basement membrane dystrophy; ACIOL anterior chamber intraocular lens; IOL intraocular lens; PCIOL posterior chamber intraocular lens; Phaco/IOL phacoemulsification with intraocular lens placement; [place] photorefractive keratectomy; LASIK laser assisted in situ keratomileusis; HTN hypertension; DM diabetes mellitus; COPD chronic obstructive  pulmonary disease

## 2017-12-02 ENCOUNTER — Ambulatory Visit (INDEPENDENT_AMBULATORY_CARE_PROVIDER_SITE_OTHER): Payer: Commercial Managed Care - PPO | Admitting: Ophthalmology

## 2017-12-02 ENCOUNTER — Encounter (INDEPENDENT_AMBULATORY_CARE_PROVIDER_SITE_OTHER): Payer: Self-pay | Admitting: Ophthalmology

## 2017-12-02 DIAGNOSIS — E113393 Type 2 diabetes mellitus with moderate nonproliferative diabetic retinopathy without macular edema, bilateral: Secondary | ICD-10-CM

## 2017-12-02 DIAGNOSIS — H35033 Hypertensive retinopathy, bilateral: Secondary | ICD-10-CM | POA: Diagnosis not present

## 2017-12-02 DIAGNOSIS — I10 Essential (primary) hypertension: Secondary | ICD-10-CM

## 2017-12-02 DIAGNOSIS — E11319 Type 2 diabetes mellitus with unspecified diabetic retinopathy without macular edema: Secondary | ICD-10-CM

## 2017-12-02 DIAGNOSIS — H25813 Combined forms of age-related cataract, bilateral: Secondary | ICD-10-CM

## 2017-12-02 DIAGNOSIS — H3581 Retinal edema: Secondary | ICD-10-CM

## 2017-12-02 DIAGNOSIS — H35049 Retinal micro-aneurysms, unspecified, unspecified eye: Secondary | ICD-10-CM

## 2017-12-02 MED ORDER — PREDNISOLONE ACETATE 1 % OP SUSP: 1.0000 [drp] | Freq: Four times a day (QID) | OPHTHALMIC | 0 refills | Status: AC

## 2017-12-17 DIAGNOSIS — M5020 Other cervical disc displacement, unspecified cervical region: Secondary | ICD-10-CM | POA: Insufficient documentation

## 2018-01-03 NOTE — Progress Notes (Signed)
[facility] Note  01/06/2018     CHIEF COMPLAINT Patient presents for Retina Follow Up   HISTORY OF PRESENT ILLNESS: Juan [name] is a 52 y.o. male who presents to the clinic today for:   HPI    Retina Follow Up    Patient presents with  Diabetic Retinopathy.  In both eyes.  Severity is moderate.  Duration of 4 weeks.  Since onset it is stable.  I, the attending physician,  performed the HPI with the patient and updated documentation appropriately.          Comments    Pt presents for diabetic retinal microaneurysm F/U, pt states his vision has been fine since last visit, he states his eye got sore a couple days from allergies, pt denies any new flashes or floaters, pts blood sugar was 250 this morning       Last edited by Bernarda Caffey, MD on 01/06/2018  9:09 AM. (History)       Referring physician: [name], NP [place] [place], [place] [address]  HISTORICAL INFORMATION:   Selected notes from the MEDICAL RECORD NUMBER Referred by Dr. [name] for DM exam LEE-  Ocular Hx-  PMH- type II DM, HTN, hyperlipidemia, hypertriglyceridemia, anemia, non hodgekins lymphoma, sleep apnea; last A1C 8.0    CURRENT MEDICATIONS: No current outpatient medications on file. (Ophthalmic Drugs)   No current facility-administered medications for this visit.  (Ophthalmic Drugs)   Current Outpatient Medications (Other)  Medication Sig  . aspirin 81 MG chewable tablet Chew 1 tablet (81 mg total) by mouth daily.  . betamethasone dipropionate (DIPROLENE) 0.05 % cream Apply 1 application topically 2 (two) times daily.  . cephALEXin (KEFLEX) 500 MG capsule TAKE 1 CAPSULE BY MOUTH TWICE A DAY FOR 10 DAYS  . clindamycin (CLEOCIN) 300 MG capsule Take 300 mg by mouth 3 (three) times daily.  . clopidogrel (PLAVIX) 75 MG tablet Take 1 tablet (75 mg total) by mouth daily with breakfast.  . Continuous Blood Gluc Receiver (FREESTYLE LIBRE 14 DAY  READER) DEVI daily. as directed  . Continuous Blood Gluc Sensor (FREESTYLE LIBRE 14 DAY SENSOR) MISC USE AS DIRECTED EVERY 14 DAYS  . FARXIGA 5 MG TABS tablet Take 5 mg by mouth daily.  . fenofibrate 160 MG tablet Take 160 mg by mouth daily.   . furosemide (LASIX) 40 MG tablet Take 1 tablet (40 mg total) by mouth daily.  . Insulin Human (INSULIN PUMP) SOLN Inject into the skin.  Marland Kitchen insulin regular human CONCENTRATED (HUMULIN R) 500 UNIT/ML SOLN injection Inject into the skin. Using insulin pump  . isosorbide mononitrate (IMDUR) 30 MG 24 hr tablet Take 0.5 tablets (15 mg total) by mouth daily.  . metFORMIN (GLUCOPHAGE) 1000 MG tablet Take 1,000 mg by mouth 2 (two) times daily with a meal.  . metoprolol succinate (TOPROL-XL) 50 MG 24 hr tablet TAKE 1 TABLET EVERY DAY WITH OR IMMEDIATELY  . nitroGLYCERIN (NITROSTAT) 0.4 MG SL tablet Place 1 tablet (0.4 mg total) under the tongue every 5 (five) minutes as needed.  . Omega-3 Fatty Acids (FISH OIL) 1000 MG CAPS Take 6,000 mg by mouth 2 (two) times daily.   . ramipril (ALTACE) 5 MG capsule Take 5 mg by mouth daily.  . simvastatin (ZOCOR) 10 MG tablet TAKE 1 TABLET EVERY DAY   No current facility-administered medications for this visit.  (Other)      REVIEW OF SYSTEMS: ROS  Positive for: Endocrine, Cardiovascular, Eyes   Negative for: Constitutional, Gastrointestinal, Neurological, Skin, Genitourinary, Musculoskeletal, HENT, Respiratory, Psychiatric, Allergic/Imm, Heme/Lymph   Last edited by Debbrah Alar, COT on 01/06/2018  8:27 AM. (History)    pt states there has been no change in his vision, pt states [name] is his primary ophthalmologist   ALLERGIES Allergies  Allergen Reactions  . Reglan [Metoclopramide] Other (See Comments)    anxiety    PAST MEDICAL HISTORY Past Medical History:  Diagnosis Date  . Abnormal cardiovascular stress test 02/06/2013  . Anemia    occasional - no problems currently(10/02/2011)  . Bilateral  renal cysts 06/16/2011   states no known problems  . Blood transfusion without reported diagnosis   . CAD (coronary artery disease), LAD 90%, 1st diag 95%, LCX 70% wth AV groove 90%, RCA 40-50% mid and 80% long distal stenosis 02/03/13 02/04/2013  . Chest pain    positive Myoview stress test  . Coronary artery calcification seen on CAT scan   . Diabetes mellitus    IDDM  . Fatty liver disease, nonalcoholic 09/28/4578  . Hyperlipidemia   . Hypertension    states no dx. of HTN, takes med. to protect kidneys due to DM  . Lateral meniscus tear 09/2011   left  . Loose body in knee 09/2011   loose bodies left knee  . Non Hodgkin's lymphoma ([place]) 1991  . Pancreatitis    occasional - last episode 06/2011  . S/P CABG x 4, 02/05/13 LIMA-LAD; LT. RADIAL-OM;VG-DIAG; VG-PDA 02/06/2013   10/18 3/4 patent grafts (occluded SVG-->Diag), PCI/DESx 3 SVG-->RCA, normal EF  . Splenomegaly, congestive, chronic   . Stuffy and runny nose 10/02/2011   yellow drainage from nose   Past Surgical History:  Procedure Laterality Date  . CORONARY ARTERY BYPASS GRAFT N/A 02/05/2013   Procedure: CORONARY ARTERY BYPASS GRAFTING (CABG);  Surgeon: Ivin Poot, MD;  Location: [facility];  Service: Open Heart Surgery;  Laterality: N/A;  Coronary artery bypass graft on pump times four using left internal mammary artery and right greater saphenous vein via endovein harvest and left radial artery harvest.   . CORONARY STENT INTERVENTION  12/17/2016    PCI and drug-eluting stenting of the mid and distal RCA SVG   . CORONARY STENT INTERVENTION N/A 12/17/2016   Procedure: CORONARY STENT INTERVENTION;  Surgeon: Lorretta Harp, MD;  Location: [facility];  Service: Cardiovascular;  Laterality: N/A;  . ELBOW SURGERY    . HERNIA REPAIR     inguinal   . herniated disc    . INTRAOPERATIVE TRANSESOPHAGEAL ECHOCARDIOGRAM N/A 02/05/2013   Procedure: INTRAOPERATIVE TRANSESOPHAGEAL ECHOCARDIOGRAM;  Surgeon: Ivin Poot, MD;   Location: [facility];  Service: Open Heart Surgery;  Laterality: N/A;  . KNEE ARTHROSCOPY  10/09/2011   Procedure: ARTHROSCOPY KNEE;  Surgeon: Nita Sells, MD;  Location: [facility];  Service: Orthopedics;  Laterality: Left;  . LEFT HEART CATH AND CORS/GRAFTS ANGIOGRAPHY N/A 12/17/2016   Procedure: LEFT HEART CATH AND CORS/GRAFTS ANGIOGRAPHY;  Surgeon: Lorretta Harp, MD;  Location: [facility];  Service: Cardiovascular;  Laterality: N/A;  . LEFT HEART CATHETERIZATION WITH CORONARY ANGIOGRAM N/A 02/03/2013   Procedure: LEFT HEART CATHETERIZATION WITH CORONARY ANGIOGRAM;  Surgeon: Lorretta Harp, MD;  Location: [facility] C[facility];  Service: Cardiovascular;  Laterality: N/A;  . NASAL SEPTUM SURGERY    . RADIAL ARTERY HARVEST Left 02/05/2013   Procedure: RADIAL ARTERY HARVEST;  Surgeon: Tharon Aquas Trigt,  MD;  Location: [facility];  Service: Vascular;  Laterality: Left;  . TIBIA BONE BIOPSY  x 3   left  . TONSILLECTOMY      FAMILY HISTORY Family History  Adopted: Yes    SOCIAL HISTORY Social History   Tobacco Use  . Smoking status: Never Smoker  . Smokeless tobacco: Never Used  Substance Use Topics  . Alcohol use: No  . Drug use: No         OPHTHALMIC EXAM:  Base Eye Exam    Visual Acuity (Snellen - Linear)      Right Left   Dist cc 20/20 -1 20/25 -2   Dist ph cc NI 20/25 +2       Tonometry (Tonopen, 8:33 AM)      Right Left   Pressure 13 15       Pupils      Dark Light Shape React APD   Right 4 2 Round Brisk None   Left 4 2 Round Brisk None       Visual Fields (Counting fingers)      Left Right    Full Full       Extraocular Movement      Right Left    Full, Ortho Full, Ortho       Neuro/Psych    Oriented x3:  Yes   Mood/Affect:  Normal       Dilation    Both eyes:  1.0% Mydriacyl, 2.5% Phenylephrine @ 8:33 AM        Slit Lamp and Fundus Exam    Slit Lamp Exam      Right Left   Lids/Lashes Normal Normal   Conjunctiva/Sclera  White and quiet White and quiet   Cornea Inferior-temporal K scar, 1+ Punctate epithelial erosions, mild Arcus 1+ Punctate epithelial erosions, mild Arcus, scattered peripheral areas of corneal haze / mild scarring   Anterior Chamber Deep and quiet Deep and quiet   Iris Round and dilated, No NVI Round and dilated, No NVI   Lens 1-2+ Nuclear sclerosis, 1-2+ Cortical cataract 1-2+ Nuclear sclerosis, 1-2+ Cortical cataract   Vitreous Vitreous syneresis Vitreous syneresis       Fundus Exam      Right Left   Disc sharp, No NVD, Pink and Sharp Normal, No NVD   C/D Ratio 0.2 0.4   Macula Good foveal reflex, few Microaneurysms, mild Retinal pigment epithelial mottling, macroaneurysms along arcades Good foveal reflex, few Microaneurysms, small flame hemorrhage along inferior arcades, no edema   Vessels Mild Copper wiring, mild AV crossing changes Mild Copper wiring, mild AV crossing changes   Periphery Attached, scattered punctate RPE changes, focal area of exudation just above superior arcade -- improved scattered small patches of IRH and exudates Attached, focal area of exudation off the 0800 nerve with central hemorrhage, focal hemorrhage/exudation at 0100 1.5 DD from disc -- improved; scattered focal areas of IRH/exudation -- minimal          IMAGING AND PROCEDURES  Imaging and Procedures for 05/23/17  OCT, Retina - OU - Both Eyes       Right Eye Quality was good. Central Foveal Thickness: 295. Progression has been stable. Findings include normal foveal contour, no IRF, no SRF.   Left Eye Quality was good. Central Foveal Thickness: 300. Progression has been stable. Findings include normal foveal contour, no IRF, no SRF, vitreomacular adhesion , intraretinal hyper-reflective material (Focal areas of IRHM nasal to disc).   Notes *Images captured and stored  on drive  Diagnosis / Impression:  NFP, No IRF/SRF VMA, focal area of IRHM OS -- sup and inf nasal to disc  Clinical management:   See below  Abbreviations: NFP - Normal foveal profile. CME - cystoid macular edema. PED - pigment epithelial detachment. IRF - intraretinal fluid. SRF - subretinal fluid. EZ - ellipsoid zone. ERM - epiretinal membrane. ORA - outer retinal atrophy. ORT - outer retinal tubulation. SRHM - subretinal hyper-reflective material                  ASSESSMENT/PLAN:    ICD-10-CM   1. Diabetic retinal microaneurysm (HCC) E11.319 OCT, Retina - OU - Both Eyes   H35.049   2. Moderate nonproliferative diabetic retinopathy of both eyes without macular edema associated with type 2 diabetes mellitus ([place]) K02.5427   3. Essential hypertension I10   4. Hypertensive retinopathy of both eyes H35.033   5. Retinal edema H35.81 OCT, Retina - OU - Both Eyes  6. Combined forms of age-related cataract of both eyes H25.813     1,2. DMII w/ nonproliferative diabetic retinopathy and noncentral diabetic retinal edema, both eyes - exam shows focal area of hemorrhage and exudates OU -- prior exams with progressively worsening focal hemorrhage and exudates - FA shows non central, focal areas of late leaking MA with surrounding cap nonperfusion - OCT without central diabetic macular edema, both eyes, but focal areas of retinal edema / thickening corresponding to peripheral / noncentral areas of hemorrhage and exudation - pt reports history of CAD with stents placed Oct 2018, currently on plavix for 1 year -- possibly contributing to pathology. - S/P focal laser OS (09.16.19) - good response -- interval improvement in hemorrhage and exudation OS - f/u in 3 months with wide field OCT and color fundus photos  3,4. Hypertensive retinopathy OU - discussed importance of tight BP control - monitor  5. Peripheral retinal edema noted on exam and OCT as described above  6. Combined form age-related cataract OU-  - The symptoms of cataract, surgical options, and treatments and risks were discussed with patient. -  discussed diagnosis and progression - not yet visually significant - monitor for now   Ophthalmic Meds Ordered this visit:  No orders of the defined types were placed in this encounter.      Return in about 3 months (around 04/08/2018) for F/U NPDR OU, DFE, OCT.  There are no Patient Instructions on file for this visit.   Explained the diagnoses, plan, and follow up with the patient and they expressed understanding.  Patient expressed understanding of the importance of proper follow up care.   This document serves as a record of services personally performed by Gardiner Sleeper, MD, PhD. It was created on their behalf by Ernest Mallick, OA, an ophthalmic assistant. The creation of this record is the provider's dictation and/or activities during the visit.    Electronically signed by: Ernest Mallick, OA  10.18.19 9:44 AM    Gardiner Sleeper, M.D., Ph.D. Diseases & Surgery of the Retina and Vitreous [facility]   I have reviewed the above documentation for accuracy and completeness, and I agree with the above. Gardiner Sleeper, M.D., Ph.D. 01/06/18 9:44 AM    Abbreviations: M myopia (nearsighted); A astigmatism; H hyperopia (farsighted); P presbyopia; Mrx spectacle prescription;  CTL contact lenses; OD right eye; OS left eye; OU both eyes  XT exotropia; ET esotropia; PEK punctate epithelial keratitis; PEE punctate epithelial erosions; DES  dry eye syndrome; MGD meibomian gland dysfunction; ATs artificial tears; PFAT's preservative free artificial tears; [place] nuclear sclerotic cataract; PSC posterior subcapsular cataract; ERM epi-retinal membrane; PVD posterior vitreous detachment; RD retinal detachment; DM diabetes mellitus; DR diabetic retinopathy; NPDR non-proliferative diabetic retinopathy; PDR proliferative diabetic retinopathy; CSME clinically significant macular edema; DME diabetic macular edema; dbh dot blot hemorrhages; CWS cotton wool spot; POAG primary open angle  glaucoma; C/D cup-to-disc ratio; HVF humphrey visual field; GVF goldmann visual field; OCT optical coherence tomography; IOP intraocular pressure; BRVO Branch retinal vein occlusion; CRVO central retinal vein occlusion; CRAO central retinal artery occlusion; BRAO branch retinal artery occlusion; RT retinal tear; SB scleral buckle; PPV pars plana vitrectomy; VH Vitreous hemorrhage; PRP panretinal laser photocoagulation; IVK intravitreal kenalog; VMT vitreomacular traction; MH Macular hole;  NVD neovascularization of the disc; NVE neovascularization elsewhere; AREDS age related eye disease study; ARMD age related macular degeneration; POAG primary open angle glaucoma; EBMD epithelial/anterior basement membrane dystrophy; ACIOL anterior chamber intraocular lens; IOL intraocular lens; PCIOL posterior chamber intraocular lens; Phaco/IOL phacoemulsification with intraocular lens placement; [place] photorefractive keratectomy; LASIK laser assisted in situ keratomileusis; HTN hypertension; DM diabetes mellitus; COPD chronic obstructive pulmonary disease

## 2018-01-06 ENCOUNTER — Ambulatory Visit (INDEPENDENT_AMBULATORY_CARE_PROVIDER_SITE_OTHER): Payer: Commercial Managed Care - PPO | Admitting: Ophthalmology

## 2018-01-06 ENCOUNTER — Encounter (INDEPENDENT_AMBULATORY_CARE_PROVIDER_SITE_OTHER): Payer: Self-pay | Admitting: Ophthalmology

## 2018-01-06 DIAGNOSIS — H35033 Hypertensive retinopathy, bilateral: Secondary | ICD-10-CM

## 2018-01-06 DIAGNOSIS — H35049 Retinal micro-aneurysms, unspecified, unspecified eye: Secondary | ICD-10-CM

## 2018-01-06 DIAGNOSIS — H3581 Retinal edema: Secondary | ICD-10-CM | POA: Diagnosis not present

## 2018-01-06 DIAGNOSIS — E11319 Type 2 diabetes mellitus with unspecified diabetic retinopathy without macular edema: Secondary | ICD-10-CM

## 2018-01-06 DIAGNOSIS — H25813 Combined forms of age-related cataract, bilateral: Secondary | ICD-10-CM

## 2018-01-06 DIAGNOSIS — E113393 Type 2 diabetes mellitus with moderate nonproliferative diabetic retinopathy without macular edema, bilateral: Secondary | ICD-10-CM

## 2018-01-06 DIAGNOSIS — I10 Essential (primary) hypertension: Secondary | ICD-10-CM

## 2018-01-07 ENCOUNTER — Ambulatory Visit: Payer: Commercial Managed Care - PPO | Admitting: Cardiovascular Disease

## 2018-01-07 ENCOUNTER — Encounter: Payer: Self-pay | Admitting: Cardiovascular Disease

## 2018-01-07 VITALS — BP 136/88 | HR 96 | Ht 71.0 in | Wt 238.0 lb

## 2018-01-07 DIAGNOSIS — I4510 Unspecified right bundle-branch block: Secondary | ICD-10-CM | POA: Insufficient documentation

## 2018-01-07 DIAGNOSIS — Z951 Presence of aortocoronary bypass graft: Secondary | ICD-10-CM | POA: Diagnosis not present

## 2018-01-07 DIAGNOSIS — I5189 Other ill-defined heart diseases: Secondary | ICD-10-CM | POA: Diagnosis not present

## 2018-01-07 MED ORDER — ISOSORBIDE MONONITRATE ER 30 MG PO TB24: 15.0000 mg | ORAL_TABLET | Freq: Every day | ORAL | 3 refills | Status: DC

## 2018-01-07 NOTE — Patient Instructions (Signed)
Medication Instructions:  Your physician has recommended you make the following change in your medication:  1) START Imdur 15mg   Take HALF tablet by mouth ONCE daily  If you need a refill on your cardiac medications before your next appointment, please call your pharmacy.   Lab work: none If you have labs (blood work) drawn today and your tests are completely normal, you will receive your results only by: Marland Kitchen MyChart Message (if you have MyChart) OR . A paper copy in the mail If you have any lab test that is abnormal or we need to change your treatment, we will call you to review the results.  Testing/Procedures: none  Follow-Up: At [facility], you and your health needs are our priority.  As part of our continuing mission to provide you with exceptional heart care, we have created designated Provider Care Teams.  These Care Teams include your primary Cardiologist (physician) and Advanced Practice Providers (APPs -  Physician Assistants and Nurse Practitioners) who all work together to provide you with the care you need, when you need it. Your physician wants you to follow-up in: 6 months with Dr. Gwenlyn Found. Please call our office 2 months before to schedule.  You have been referred to Dr. Debara Pickett - [facility]   Any Other Special Instructions Will Be Listed Below (If Applicable).

## 2018-01-07 NOTE — Assessment & Plan Note (Signed)
History of dyslipidemia/hypertriglyceridemia on fenofibrate and simvastatin followed by Dr. Arther Dames at [facility] with recent lipid profile performed 09/24/2017 revealing total cholesterol 550, triglyceride level of 4730.  I am going to refer him to Dr. Debara Pickett for further evaluation.

## 2018-01-07 NOTE — Assessment & Plan Note (Signed)
This is a new finding on his EKG today.

## 2018-01-07 NOTE — Progress Notes (Signed)
01/07/2018 Juan [name]   1966/02/19  [id number]  Primary Physician [name], NP Primary Cardiologist: Lorretta Harp MD FACP, [place], [place], [place]  HPI:  Juan [name] is a 52 y.o.  mild to moderately overweight, married Caucasian male, father of 1 child who I last saw in the 04/03/2017. He has seen Dr. Sallyanne Kuster twice in 2011. He has a long history of insulin-dependent diabetes as well as extremely high hypertriglyceridemia in the 3000-6000 range with multiple episodes of pancreatitis. He had negative venous Dopplers for DVTs. He does wear compression stockings. He has complained of left inframammary chest pain and increasing shortness of breath. He is not aware of his family history since he was adopted. He had a CT scan of his chest and abdomen which showed a mass at the tail of his pancreas but also showed severe calcification in the LAD. He did have a Myoview stress test performed 2 years ago that was nonischemic. At that time, he had no symptoms of chest pain or shortness of breath. I referred him to Dr. Lavetta Nielsen at [facility] for more intense treatment of his severe hypertriglyceridemia.since I saw him last in December of last several weeks he developed exertional chest pain and shortness of breath.I was concerned that he has developed progression of disease given all of his risk factors.  He had a Myoview stress test performed that showed inferior ischemia which is high risk. He continued to have exertional chest pain with left upper irradiation.I performed cardiac catheterization on him 02/03/13 revealing three-vessel disease and preserved LV function. He subsequently underwent coronary artery bypass bypass grafting x4 by Dr. Tharon Aquas Trigt with a LIMA to his LAD, a left radial to obtuse marginal branch, vein graft to an diagonal branch and PDA. His postop course was uncomplicated. He completed 7 weeks her cardiac rehabilitation and is back to work. Since I saw  him in the office 4 months ago he has seen Kerin Ransom back who ordered a 2-D echo that showed normal LV systolic function, grade 2 diastolic dysfunction and a Myoview stress test that was normal as well.  Saw Doyel last in October he is complaining of chest pain and left upper extremity pain. Based on this I performed outpatient cardiac catheterization on him 12/17/16 revealing high-grade disease in his RCA SVG which is stented using several synergy drug-eluting stents. This resulted in resolution of his anginal symptoms. He also has recurrent cellulitis of his right lower extremity on antibiotics with chronic lower extremity edema for which she uses compression stockings.  Since I saw him back 2 months ago he is done relatively well.  He has had new onset atypical chest pain occurring at least once a week lasting minutes at a time which he attributes to increasing stress.  He never filled his prescription for isosorbide mononitrate.  His recent lab work did reveal total cholesterol 550 with her triglyceride level of 4730.  He has chronic lower extremity edema.  Current Meds  Medication Sig  . aspirin 81 MG chewable tablet Chew 1 tablet (81 mg total) by mouth daily.  . clopidogrel (PLAVIX) 75 MG tablet Take 1 tablet (75 mg total) by mouth daily with breakfast.  . Continuous Blood Gluc Receiver (FREESTYLE LIBRE 14 DAY READER) DEVI daily. as directed  . Continuous Blood Gluc Sensor (FREESTYLE LIBRE 14 DAY SENSOR) MISC USE AS DIRECTED EVERY 14 DAYS  . FARXIGA 5 MG TABS tablet Take 5 mg by mouth  daily.  . fenofibrate 160 MG tablet Take 160 mg by mouth daily.   . furosemide (LASIX) 40 MG tablet Take 1 tablet (40 mg total) by mouth daily.  . Insulin Human (INSULIN PUMP) SOLN Inject into the skin.  Marland Kitchen insulin regular human CONCENTRATED (HUMULIN R) 500 UNIT/ML SOLN injection Inject into the skin. Using insulin pump  . metFORMIN (GLUCOPHAGE) 1000 MG tablet Take 1,000 mg by mouth 2 (two) times daily with a  meal.  . metoprolol succinate (TOPROL-XL) 50 MG 24 hr tablet TAKE 1 TABLET EVERY DAY WITH OR IMMEDIATELY  . nitroGLYCERIN (NITROSTAT) 0.4 MG SL tablet Place 1 tablet (0.4 mg total) under the tongue every 5 (five) minutes as needed.  . Omega-3 Fatty Acids (FISH OIL) 1000 MG CAPS Take 6,000 mg by mouth 2 (two) times daily.   . ramipril (ALTACE) 5 MG capsule Take 5 mg by mouth daily.  . simvastatin (ZOCOR) 10 MG tablet TAKE 1 TABLET EVERY DAY     Allergies  Allergen Reactions  . Reglan [Metoclopramide] Other (See Comments)    anxiety    Social History   Socioeconomic History  . Marital status: Married    Spouse name: [name]  . Number of children: 1  . Years of education: 59  . Highest education level: Not on file  Occupational History  . Occupation: Engineer, drilling     Employer: [employer]  Social Needs  . Financial resource strain: Not on file  . Food insecurity:    Worry: Not on file    Inability: Not on file  . Transportation needs:    Medical: Not on file    Non-medical: Not on file  Tobacco Use  . Smoking status: Never Smoker  . Smokeless tobacco: Never Used  Substance and Sexual Activity  . Alcohol use: No  . Drug use: No  . Sexual activity: Yes  Lifestyle  . Physical activity:    Days per week: Not on file    Minutes per session: Not on file  . Stress: Not on file  Relationships  . Social connections:    Talks on phone: Not on file    Gets together: Not on file    Attends religious service: Not on file    Active member of club or organization: Not on file    Attends meetings of clubs or organizations: Not on file    Relationship status: Not on file  . Intimate partner violence:    Fear of current or ex partner: Not on file    Emotionally abused: Not on file    Physically abused: Not on file    Forced sexual activity: Not on file  Other Topics Concern  . Not on file  Social History Narrative   Adopted, so no pertinent FH.  Married.   Lives with wife in [place].  Independent of ADLs and ambulation.     Review of Systems: General: negative for chills, fever, night sweats or weight changes.  Cardiovascular: negative for chest pain, dyspnea on exertion, edema, orthopnea, palpitations, paroxysmal nocturnal dyspnea or shortness of breath Dermatological: negative for rash Respiratory: negative for cough or wheezing Urologic: negative for hematuria Abdominal: negative for nausea, vomiting, diarrhea, bright red blood per rectum, melena, or hematemesis Neurologic: negative for visual changes, syncope, or dizziness All other systems reviewed and are otherwise negative except as noted above.    Blood pressure 136/88, pulse 96, height 5\' 11"  (1.803 m), weight 238 lb (108 kg).  General appearance: alert  and no distress Neck: no adenopathy, no carotid bruit, no JVD, supple, symmetrical, trachea midline and thyroid not enlarged, symmetric, no tenderness/mass/nodules Lungs: clear to auscultation bilaterally Heart: regular rate and rhythm, S1, S2 normal, no murmur, click, rub or gallop Extremities: 2+ lower extremity edema bilaterally Pulses: 2+ and symmetric Skin: Skin color, texture, turgor normal. No rashes or lesions Neurologic: Alert and oriented X 3, normal strength and tone. Normal symmetric reflexes. Normal coordination and gait  EKG sinus rhythm 96 with bifascicular block (right bundle branch block/left anterior fascicular block).  This is a new finding since his previous EKG a year ago.  I personally reviewed this EKG.  ASSESSMENT AND PLAN:   Dyslipidemia History of dyslipidemia/hypertriglyceridemia on fenofibrate and simvastatin followed by Dr. Arther Dames at [facility] with recent lipid profile performed 09/24/2017 revealing total cholesterol 550, triglyceride level of 4730.  I am going to refer him to Dr. Debara Pickett for further evaluation.  S/P CABG x 4 History of CAD status post CABG times 411/20/14 with a LIMA to his LAD, left  radial to an OM, vein graft to diagonal branch and PDA.  Because of anginal symptoms I performed cardiac catheterization on him 12/17/2016 revealing occluded vein to diagonal branch, patent left radial to an OM, patent LIMA to the LAD and patent vein to a PDA with high-grade disease in the mid and distal portions all of which I stented.  His symptoms improved after that.  He currently has had some mild recurrent symptoms occurring for minutes at a time on a weekly basis which he relates to increased stress.  I am going to renew his prescription for indoor 50 mg a day.  Right bundle branch block This is a new finding on his EKG today.      Lorretta Harp MD FACP,FACC,FAHA, [facility] 01/07/2018 10:04 AM

## 2018-01-07 NOTE — Assessment & Plan Note (Signed)
History of CAD status post CABG times 411/20/14 with a LIMA to his LAD, left radial to an OM, vein graft to diagonal branch and PDA.  Because of anginal symptoms I performed cardiac catheterization on him 12/17/2016 revealing occluded vein to diagonal branch, patent left radial to an OM, patent LIMA to the LAD and patent vein to a PDA with high-grade disease in the mid and distal portions all of which I stented.  His symptoms improved after that.  He currently has had some mild recurrent symptoms occurring for minutes at a time on a weekly basis which he relates to increased stress.  I am going to renew his prescription for indoor 50 mg a day.

## 2018-02-07 ENCOUNTER — Encounter: Payer: Self-pay | Admitting: Internal Medicine

## 2018-02-07 ENCOUNTER — Ambulatory Visit: Payer: Commercial Managed Care - PPO | Admitting: Internal Medicine

## 2018-02-07 VITALS — BP 152/82 | HR 85 | Ht 71.0 in | Wt 234.8 lb

## 2018-02-07 DIAGNOSIS — E783 Hyperchylomicronemia: Secondary | ICD-10-CM

## 2018-02-07 DIAGNOSIS — Z79899 Other long term (current) drug therapy: Secondary | ICD-10-CM | POA: Diagnosis not present

## 2018-02-07 DIAGNOSIS — E119 Type 2 diabetes mellitus without complications: Secondary | ICD-10-CM | POA: Diagnosis not present

## 2018-02-07 DIAGNOSIS — Z794 Long term (current) use of insulin: Secondary | ICD-10-CM

## 2018-02-07 DIAGNOSIS — Z951 Presence of aortocoronary bypass graft: Secondary | ICD-10-CM | POA: Diagnosis not present

## 2018-02-07 MED ORDER — ICOSAPENT ETHYL 1 G PO CAPS: 2.0000 g | ORAL_CAPSULE | Freq: Two times a day (BID) | ORAL | 11 refills | Status: DC

## 2018-02-07 NOTE — Patient Instructions (Signed)
Medication Instructions:  START vascepa 2 gram by mouth twice daily If you need a refill on your cardiac medications before your next appointment, please call your pharmacy.   Lab work: Liver Function Test in 2-4 weeks (Dec 6-20) Lipid Panel with Direct LDL in 3-4 months (prior to next visit with Dr. Debara Pickett) If you have labs (blood work) drawn today and your tests are completely normal, you will receive your results only by: Marland Kitchen MyChart Message (if you have MyChart) OR . A paper copy in the mail If you have any lab test that is abnormal or we need to change your treatment, we will call you to review the results.  Testing/Procedures: NONE  Follow-Up: At [facility], you and your health needs are our priority.  As part of our continuing mission to provide you with exceptional heart care, we have created designated Provider Care Teams.  These Care Teams include your primary Cardiologist (physician) and Advanced Practice Providers (APPs -  Physician Assistants and Nurse Practitioners) who all work together to provide you with the care you need, when you need it. You will need a follow up appointment in 3-4 months with Dr. Debara Pickett (lipid clinic)  Any Other Special Instructions Will Be Listed Below (If Applicable).

## 2018-02-07 NOTE — Progress Notes (Addendum)
LIPID CLINIC CONSULT NOTE  Chief Complaint:  High triglycerides  Primary Care Physician: [name], NP  HPI:  Juan [name] is a 52 y.o. male who is being seen today for the evaluation of high triglycerides at the request of [name], NP.  Juan [name] is seen today for evaluation of elevated triglycerides.  He has been a patient of Juan [name] at [facility] for many years.  He was last seen there in 2018.  He has hyperchylomicronemia.  Triglycerides have been very high and most recently up to 4730.  He says at best his triglycerides have come down to 450, but this was after very strict low saturated fat and calorie diet.  He says he has not been able to maintain that.  He also has diabetes and his blood sugars have not been optimally controlled.  His most recent A1c in September 2019 was 9.6.  He sees [name].  He has been on fenofibrate 160 mg.  In addition he takes low-dose simvastatin 10 mg.  He has a history of steatohepatitis and elevated liver enzymes in the past however most recently his ALT was 62 in September.  He also has clinical coronary disease having been found to have multivessel coronary disease in 2014 and subsequently he underwent coronary artery bypass grafting and has had multivessel PCI.  He was previously considered for niacin therapy however never really took that because of not being able to achieve good glycemic control.  He is not previously been on any omega-3's and had been considered for clinical trials but was not enrolled.  PMHx:  Past Medical History:  Diagnosis Date  . Abnormal cardiovascular stress test 02/06/2013  . Anemia    occasional - no problems currently(10/02/2011)  . Bilateral renal cysts 06/16/2011   states no known problems  . Blood transfusion without reported diagnosis   . CAD (coronary artery disease), LAD 90%, 1st diag 95%, LCX 70% wth AV groove 90%, RCA 40-50% mid and 80% long distal stenosis 02/03/13 02/04/2013  . Chest pain    positive Myoview stress test  . Coronary artery calcification seen on CAT scan   . Diabetes mellitus    IDDM  . Fatty liver disease, nonalcoholic 0/17/7939  . Hyperlipidemia   . Hypertension    states no dx. of HTN, takes med. to protect kidneys due to DM  . Lateral meniscus tear 09/2011   left  . Loose body in knee 09/2011   loose bodies left knee  . Non Hodgkin's lymphoma ([place]) 1991  . Pancreatitis    occasional - last episode 06/2011  . S/P CABG x 4, 02/05/13 LIMA-LAD; LT. RADIAL-OM;VG-DIAG; VG-PDA 02/06/2013   10/18 3/4 patent grafts (occluded SVG-->Diag), PCI/DESx 3 SVG-->RCA, normal EF  . Splenomegaly, congestive, chronic   . Stuffy and runny nose 10/02/2011   yellow drainage from nose    Past Surgical History:  Procedure Laterality Date  . ABDOMINAL AORTIC ANEURYSM REPAIR    . CORONARY ARTERY BYPASS GRAFT N/A 02/05/2013   Procedure: CORONARY ARTERY BYPASS GRAFTING (CABG);  Surgeon: Ivin Poot, MD;  Location: [place];  Service: Open Heart Surgery;  Laterality: N/A;  Coronary artery bypass graft on pump times four using left internal mammary artery and right greater saphenous vein via endovein harvest and left radial artery harvest.   . CORONARY STENT INTERVENTION  12/17/2016    PCI and drug-eluting stenting of the mid and distal RCA SVG   . CORONARY STENT INTERVENTION N/A  12/17/2016   Procedure: CORONARY STENT INTERVENTION;  Surgeon: Lorretta Harp, MD;  Location: [place] CV [facility];  Service: Cardiovascular;  Laterality: N/A;  . ELBOW SURGERY    . HERNIA REPAIR     inguinal   . herniated disc    . INTRAOPERATIVE TRANSESOPHAGEAL ECHOCARDIOGRAM N/A 02/05/2013   Procedure: INTRAOPERATIVE TRANSESOPHAGEAL ECHOCARDIOGRAM;  Surgeon: Ivin Poot, MD;  Location: [place];  Service: Open Heart Surgery;  Laterality: N/A;  . KNEE ARTHROSCOPY  10/09/2011   Procedure: ARTHROSCOPY KNEE;  Surgeon: Nita Sells, MD;  Location: [place];  Service: Orthopedics;   Laterality: Left;  . LEFT HEART CATH AND CORS/GRAFTS ANGIOGRAPHY N/A 12/17/2016   Procedure: LEFT HEART CATH AND CORS/GRAFTS ANGIOGRAPHY;  Surgeon: Lorretta Harp, MD;  Location: [place] [facility];  Service: Cardiovascular;  Laterality: N/A;  . LEFT HEART CATHETERIZATION WITH CORONARY ANGIOGRAM N/A 02/03/2013   Procedure: LEFT HEART CATHETERIZATION WITH CORONARY ANGIOGRAM;  Surgeon: Lorretta Harp, MD;  Location: [facility] CATH LAB;  Service: Cardiovascular;  Laterality: N/A;  . NASAL SEPTUM SURGERY    . RADIAL ARTERY HARVEST Left 02/05/2013   Procedure: RADIAL ARTERY HARVEST;  Surgeon: Ivin Poot, MD;  Location: [place];  Service: Vascular;  Laterality: Left;  . TIBIA BONE BIOPSY  x 3   left  . TONSILLECTOMY      FAMHx:  Family History  Adopted: Yes    SOCHx:   reports that he has never smoked. He has never used smokeless tobacco. He reports that he does not drink alcohol or use drugs.  ALLERGIES:  Allergies  Allergen Reactions  . Reglan [Metoclopramide] Other (See Comments)    anxiety    ROS: Pertinent items noted in HPI and remainder of comprehensive ROS otherwise negative.  HOME MEDS: Current Outpatient Medications on File Prior to Visit  Medication Sig Dispense Refill  . aspirin 81 MG chewable tablet Chew 1 tablet (81 mg total) by mouth daily.    . clopidogrel (PLAVIX) 75 MG tablet Take 1 tablet (75 mg total) by mouth daily with breakfast. 90 tablet 3  . Continuous Blood Gluc Receiver (FREESTYLE LIBRE 14 DAY READER) DEVI daily. as directed  6  . Continuous Blood Gluc Sensor (FREESTYLE LIBRE 14 DAY SENSOR) MISC USE AS DIRECTED EVERY 14 DAYS  6  . FARXIGA 5 MG TABS tablet Take 5 mg by mouth daily.  11  . fenofibrate 160 MG tablet Take 160 mg by mouth daily.     . furosemide (LASIX) 40 MG tablet Take 1 tablet (40 mg total) by mouth daily. 90 tablet 0  . Insulin Human (INSULIN PUMP) SOLN Inject into the skin.    Marland Kitchen insulin regular human CONCENTRATED (HUMULIN R) 500 UNIT/ML  SOLN injection Inject into the skin. Using insulin pump    . isosorbide mononitrate (IMDUR) 30 MG 24 hr tablet Take 0.5 tablets (15 mg total) by mouth daily. 45 tablet 3  . metFORMIN (GLUCOPHAGE) 1000 MG tablet Take 1,000 mg by mouth 2 (two) times daily with a meal.    . metoprolol succinate (TOPROL-XL) 50 MG 24 hr tablet TAKE 1 TABLET EVERY DAY WITH OR IMMEDIATELY 90 tablet 2  . nitroGLYCERIN (NITROSTAT) 0.4 MG SL tablet Place 1 tablet (0.4 mg total) under the tongue every 5 (five) minutes as needed. 25 tablet 2  . Omega-3 Fatty Acids (FISH OIL) 1000 MG CAPS Take 6,000 mg by mouth 2 (two) times daily.     . ramipril (ALTACE) 5 MG  capsule Take 5 mg by mouth daily.  4  . simvastatin (ZOCOR) 10 MG tablet TAKE 1 TABLET EVERY DAY 90 tablet 2   No current facility-administered medications on file prior to visit.     LABS/IMAGING: No results found for this or any previous visit (from the past 48 hour(s)). No results found.  LIPID PANEL:    Component Value Date/Time   CHOL 142 06/17/2011 0400   TRIG 709 (H) 06/17/2011 0400   HDL 10 (L) 06/17/2011 0400   CHOLHDL 14.2 06/17/2011 0400   VLDL UNABLE TO CALCULATE IF TRIGLYCERIDE OVER 400 mg/dL 06/17/2011 0400   LDLCALC UNABLE TO CALCULATE IF TRIGLYCERIDE OVER 400 mg/dL 06/17/2011 0400    WEIGHTS: Wt Readings from Last 3 Encounters:  02/07/18 234 lb 12.8 oz (106.5 kg)  01/07/18 238 lb (108 kg)  04/03/17 240 lb (108.9 kg)    VITALS: BP (!) 152/82   Pulse 85   Ht 5\' 11"  (1.803 m)   Wt 234 lb 12.8 oz (106.5 kg)   BMI 32.75 kg/m   EXAM: General appearance: alert and no distress Neck: no carotid bruit, no JVD and thyroid not enlarged, symmetric, no tenderness/mass/nodules Lungs: clear to auscultation bilaterally Heart: regular rate and rhythm, S1, S2 normal, no murmur, click, rub or gallop Abdomen: soft, non-tender; bowel sounds normal; no masses,  no organomegaly Extremities: multiple cholesterol xanthomas on the elbows, knees and  feet Pulses: 2+ and symmetric Skin: multiple xanthomas Neurologic: Grossly normal Psych: Pleasant  EKG: Deferred  ASSESSMENT: 1. Hyperchylomicronemia 2. ASCVD with prior four-vessel CABG and PCI 3. Insulin-dependent diabetes 4. Hypertension  PLAN: 1.   Mr. Zavadil has significant longstanding hyperchylomicronemia. This is associated with clinical ASCVD and prior CABG with PCI. He is at high risk for recurrent events. At one point, amazingly with strict diet he was able to achieve TG's of 450, however, most recently he is 10X that number. Diet is not strict at this point. He is aware of what he needs to eat and we reviewed that today. I think we should consider adding Vascepa, especially in light of the REDUCE-IT trial data. We will need to follow liver enzymes given his history of steatohepatitis. Plan follow-up with direct LDL and FLP in 3 months.  Thanks for the kind referral.  Pixie Casino, MD, FACC, [place] Director of the Advanced Lipid Disorders &  Cardiovascular Risk Reduction Clinic Diplomate of the American Board of Clinical Lipidology Attending Cardiologist  Direct Dial: [phone]  Fax: [phone]  Website:  [email or web address] 02/07/2018, 10:04 AM

## 2018-03-06 ENCOUNTER — Other Ambulatory Visit: Payer: Self-pay | Admitting: Orthopedic Surgery

## 2018-03-20 ENCOUNTER — Telehealth: Payer: Self-pay | Admitting: *Deleted

## 2018-03-20 ENCOUNTER — Encounter: Payer: Self-pay | Admitting: *Deleted

## 2018-03-20 NOTE — Telephone Encounter (Signed)
Dr Gwenlyn Found can you comment on holding ASA and Plavix in this patient with CAD, PVD, and HLD- he is going to have C-spine surgery so I imagine 5-7 days off DAPT.   Kerin Ransom PA-C 03/20/2018 4:25 PM

## 2018-03-20 NOTE — Telephone Encounter (Signed)
This encounter was created in error - please disregard.

## 2018-03-20 NOTE — Telephone Encounter (Signed)
   [facility] Pre-operative Risk Assessment    Request for surgical clearance:  1. What type of surgery is being performed? ANTERIOR CERVICAL DECOMPRESSION FUSION C5-6 W/INSTRUMENTATION AND ALLOGRAFT  2. When is this surgery scheduled? 05/12/18   3. What type of clearance is required (medical clearance vs. Pharmacy clearance to hold med vs. Both)? BOTH  4. Are there any medications that need to be held prior to surgery and how long?BOTH PLAVIX AND ASA HELD 7 DAYS PRIOR   5. Practice name and name of physician performing surgery? [facility]; DR. [place]   6. What is your office phone number [phone]    7.   What is your office fax number [phone]  8.   Anesthesia type (None, local, MAC, general) ? GENERAL   Julaine Hua 03/20/2018, 3:09 PM  _________________________________________________________________   (provider comments below)

## 2018-03-20 NOTE — Telephone Encounter (Signed)
ADDENDUM: CORRECTION ON DATE OF SURGERY; CORRECT DATE OF SURGERY IS 03/26/18 AS LISTED IN THE VISIT INFO. NOT 05/12/18.

## 2018-03-20 NOTE — Telephone Encounter (Signed)
It is been over a year since his right bypass graft stent procedure.  Okay to hold antiplatelet drugs for his orthopedic procedure.

## 2018-03-20 NOTE — Telephone Encounter (Signed)
   [facility] Pre-operative Risk Assessment    Request for surgical clearance:  1. What type of surgery is being performed? ANTERIOR CERVICAL DECOMPRESSION FUSION C5-6 W/INSTRUMENTATION AND ALLOGRAFT   2. When is this surgery scheduled? 03/26/18   3. What type of clearance is required (medical clearance vs. Pharmacy clearance to hold med vs. Both)? BOTH   4. Are there any medications that need to be held prior to surgery and how long? PLAVIX AND ASA   5. Practice name and name of physician performing surgery? [facility]; DR [place]   6. What is your office phone number [phone]    7.   What is your office fax number [phone]  8.   Anesthesia type (None, local, MAC, general) ? GENERAL   Juan [name] 03/20/2018, 3:25 PM  _________________________________________________________________   (provider comments below)

## 2018-03-21 NOTE — Pre-Procedure Instructions (Signed)
Juan [name]  03/21/2018      [id number] - [place],  - [address] [place]. AT [address]. [name] [address] Phone: [phone] Fax: [phone]    Your procedure is scheduled on March 26, 2018.  Report to [facility] Admitting at [phone] AM.  Call this number if you have problems the morning of surgery:  [phone]   Remember:  Do not eat or drink after midnight.   Take these medicines the morning of surgery with A SIP OF WATER  Metoprolol succinate (Toprol XL) Nitrostat-if needed for chest pain  7 days prior to surgery STOP taking any Aleve, Naproxen, Ibuprofen, Motrin, Advil, Goody's, BC's, all herbal medications, fish oil, and all vitamins  Follow your surgeon's instructions on when to hold/resume Plavix and aspirin. Hold 5-7 days per MD note   WHAT DO I DO ABOUT MY DIABETES MEDICATION?   Juan Kitchen Do not take oral diabetes medicines (pills) the morning of surgery (farxiga or metformin (glucophage).  . Do not take Wilder Glade the day before your surgery   Reviewed and Endorsed by [facility] Patient Education Committee, August 2015  Patients with Insulin Pumps     For patients with Insulin Pumps:  ? Contact your diabetes doctor for specific instructions before surgery. ? Decrease basal insulin rates by 20% at midnight the night before surgery. ? Note that if your surgery is planned to be longer than 2 hours, your insulin pump will be removed and intravenous (IV) insulin will be started and managed by the nurses and anesthesiologist. You will be able to restart your insulin pump once you are awake and able to manage it. ? Make sure to bring insulin pump supplies to the hospital with you in case your site needs to be changed.   How to Manage Your Diabetes Before and After Surgery  Why is it important to control my blood sugar before and after surgery? . Improving blood sugar levels before and after  surgery helps healing and can limit problems. . A way of improving blood sugar control is eating a healthy diet by: o  Eating less sugar and carbohydrates o  Increasing activity/exercise o  Talking with your doctor about reaching your blood sugar goals . High blood sugars (greater than 180 mg/dL) can raise your risk of infections and slow your recovery, so you will need to focus on controlling your diabetes during the weeks before surgery. . Make sure that the doctor who takes care of your diabetes knows about your planned surgery including the date and location.  How do I manage my blood sugar before surgery? . Check your blood sugar at least 4 times a day, starting 2 days before surgery, to make sure that the level is not too high or low. o Check your blood sugar the morning of your surgery when you wake up and every 2 hours until you get to the Short Stay unit. . If your blood sugar is less than 70 mg/dL, you will need to treat for low blood sugar: o Do not take insulin. o Treat a low blood sugar (less than 70 mg/dL) with  cup of clear juice (cranberry or apple), 4 glucose tablets, OR glucose gel. Recheck blood sugar in 15 minutes after treatment (to make sure it is greater than 70 mg/dL). If your blood sugar is not greater than 70 mg/dL on recheck, call [phone] o  for further instructions. . Report your blood sugar  to the short stay nurse when you get to Short Stay.  . If you are admitted to the hospital after surgery: o Your blood sugar will be checked by the staff and you will probably be given insulin after surgery (instead of oral diabetes medicines) to make sure you have good blood sugar levels. o The goal for blood sugar control after surgery is 80-180 mg/dL.  Juan [name]- Preparing For Surgery  Before surgery, you can play an important role. Because skin is not sterile, your skin needs to be as free of germs as possible. You can reduce the number of germs on your skin by  washing with CHG (chlorahexidine gluconate) Soap before surgery.  CHG is an antiseptic cleaner which kills germs and bonds with the skin to continue killing germs even after washing.    Oral Hygiene is also important to reduce your risk of infection.  Remember - BRUSH YOUR TEETH THE MORNING OF SURGERY WITH YOUR REGULAR TOOTHPASTE  Please do not use if you have an allergy to CHG or antibacterial soaps. If your skin becomes reddened/irritated stop using the CHG.  Do not shave (including legs and underarms) for at least 48 hours prior to first CHG shower. It is OK to shave your face.  Please follow these instructions carefully.   1. Shower the NIGHT BEFORE SURGERY and the MORNING OF SURGERY with CHG.   2. If you chose to wash your hair, wash your hair first as usual with your normal shampoo.  3. After you shampoo, rinse your hair and body thoroughly to remove the shampoo.  4. Use CHG as you would any other liquid soap. You can apply CHG directly to the skin and wash gently with a scrungie or a clean washcloth.   5. Apply the CHG Soap to your body ONLY FROM THE NECK DOWN.  Do not use on open wounds or open sores. Avoid contact with your eyes, ears, mouth and genitals (private parts). Wash Face and genitals (private parts)  with your normal soap.  6. Wash thoroughly, paying special attention to the area where your surgery will be performed.  7. Thoroughly rinse your body with warm water from the neck down.  8. DO NOT shower/wash with your normal soap after using and rinsing off the CHG Soap.  9. Pat yourself dry with a CLEAN TOWEL.  10. Wear CLEAN PAJAMAS to bed the night before surgery, wear comfortable clothes the morning of surgery  11. Place CLEAN SHEETS on your bed the night of your first shower and DO NOT SLEEP WITH PETS.  Day of Surgery:  Do not apply any deodorants/lotions.  Please wear clean clothes to the hospital/surgery center.   Remember to brush your teeth WITH YOUR  REGULAR TOOTHPASTE.   Do not wear jewelry,  Do not wear lotions, powders, or colognes, or deodorant.  Men may shave face and neck.  Do not bring valuables to the hospital.   [facility] is not responsible for any belongings or valuables.  Contacts, dentures or bridgework may not be worn into surgery.  Leave your suitcase in the car.  After surgery it may be brought to your room.  For patients admitted to the hospital, discharge time will be determined by your treatment team.  Patients discharged the day of surgery will not be allowed to drive home.   Please read over the following fact sheets that you were given.

## 2018-03-21 NOTE — Progress Notes (Addendum)
PCP - [name], MD Cardiologist - Quay Burow, MD Endocrinologist- Dr. [name], MD  Chest x-ray- pt denies EKG - 01/09/18 in EPIC  Stress Test - 01/2016 in EPIC ECHO - 03/01/16 in EPIC  Cardiac Cath - 10/18 in EPIC  Sleep Study - Yes- mild per patient CPAP - does not use CPAP  Fasting Blood Sugar - 150s Checks Blood Sugar 6 times a day-Patient has insulin pump-no instructions given by his Endocrinologists  Called and informed Bethena Roys (diabetes coordinator) of patient's admission and surgery date/times.  Unless otherwise instructed by his endocrinologist he will be instructed to decrease basal rate at MD the night prior to surgery by 20%.  Insulin pump will remain on as surgery is 2 hours only.    Blood Thinner Instructions: Hold plavix 03/20/17 Aspirin Instructions: Hold 03/20/17  Anesthesia review: YES-Heart History  Patient denies shortness of breath, fever, cough and chest pain at PAT appointment  Patient verbalized understanding of instructions that were given to them at the PAT appointment. Patient was also instructed that they will need to review over the PAT instructions again at home before surgery.

## 2018-03-21 NOTE — Telephone Encounter (Signed)
   Primary Cardiologist: Quay Burow, MD  Chart reviewed and patient interviewed over the phone today as part of pre-operative protocol coverage. Given past medical history and time since last visit, based on ACC/AHA guidelines, PARKER WHERLEY would be at acceptable risk for the planned procedure without further cardiovascular testing.   OK to hold ASA and Plavix 5-7 days pre op.   I will route this recommendation to the requesting party via Epic fax function and remove from pre-op pool.  Please call with questions.  Kerin Ransom, PA-C 03/21/2018, 9:52 AM

## 2018-03-24 ENCOUNTER — Encounter (HOSPITAL_COMMUNITY): Payer: Self-pay

## 2018-03-24 ENCOUNTER — Other Ambulatory Visit: Payer: Self-pay

## 2018-03-24 ENCOUNTER — Encounter (HOSPITAL_COMMUNITY)
Admission: RE | Admit: 2018-03-24 | Discharge: 2018-03-24 | Disposition: A | Discharge status: Home / Self Care | Payer: Commercial Managed Care - PPO | Source: Ambulatory Visit | Attending: Orthopedic Surgery | Admitting: Orthopedic Surgery

## 2018-03-24 DIAGNOSIS — M5412 Radiculopathy, cervical region: Secondary | ICD-10-CM | POA: Diagnosis not present

## 2018-03-24 DIAGNOSIS — Z7982 Long term (current) use of aspirin: Secondary | ICD-10-CM | POA: Insufficient documentation

## 2018-03-24 DIAGNOSIS — Z01818 Encounter for other preprocedural examination: Secondary | ICD-10-CM | POA: Insufficient documentation

## 2018-03-24 DIAGNOSIS — M50122 Cervical disc disorder at C5-C6 level with radiculopathy: Secondary | ICD-10-CM | POA: Insufficient documentation

## 2018-03-24 DIAGNOSIS — I2510 Atherosclerotic heart disease of native coronary artery without angina pectoris: Secondary | ICD-10-CM | POA: Insufficient documentation

## 2018-03-24 DIAGNOSIS — Z794 Long term (current) use of insulin: Secondary | ICD-10-CM

## 2018-03-24 DIAGNOSIS — Z951 Presence of aortocoronary bypass graft: Secondary | ICD-10-CM

## 2018-03-24 DIAGNOSIS — E119 Type 2 diabetes mellitus without complications: Secondary | ICD-10-CM

## 2018-03-24 DIAGNOSIS — Z8572 Personal history of non-Hodgkin lymphomas: Secondary | ICD-10-CM | POA: Diagnosis not present

## 2018-03-24 DIAGNOSIS — I10 Essential (primary) hypertension: Secondary | ICD-10-CM

## 2018-03-24 DIAGNOSIS — M4802 Spinal stenosis, cervical region: Secondary | ICD-10-CM | POA: Diagnosis not present

## 2018-03-24 DIAGNOSIS — Z955 Presence of coronary angioplasty implant and graft: Secondary | ICD-10-CM

## 2018-03-24 DIAGNOSIS — Z7902 Long term (current) use of antithrombotics/antiplatelets: Secondary | ICD-10-CM | POA: Diagnosis not present

## 2018-03-24 DIAGNOSIS — E785 Hyperlipidemia, unspecified: Secondary | ICD-10-CM | POA: Diagnosis not present

## 2018-03-24 DIAGNOSIS — Z79899 Other long term (current) drug therapy: Secondary | ICD-10-CM

## 2018-03-24 DIAGNOSIS — E669 Obesity, unspecified: Secondary | ICD-10-CM | POA: Diagnosis not present

## 2018-03-24 DIAGNOSIS — Z6832 Body mass index (BMI) 32.0-32.9, adult: Secondary | ICD-10-CM | POA: Diagnosis not present

## 2018-03-24 DIAGNOSIS — G4730 Sleep apnea, unspecified: Secondary | ICD-10-CM | POA: Diagnosis not present

## 2018-03-24 DIAGNOSIS — M79602 Pain in left arm: Secondary | ICD-10-CM | POA: Diagnosis present

## 2018-03-24 DIAGNOSIS — E1165 Type 2 diabetes mellitus with hyperglycemia: Secondary | ICD-10-CM | POA: Diagnosis not present

## 2018-03-24 LAB — CBC WITH DIFFERENTIAL/PLATELET
ABS IMMATURE GRANULOCYTES: 0 10*3/uL (ref 0.00–0.07)
Basophils Absolute: 0.1 10*3/uL (ref 0.0–0.1)
Basophils Relative: 1 %
Eosinophils Absolute: 0.5 10*3/uL (ref 0.0–0.5)
Eosinophils Relative: 7 %
HCT: 45.3 % (ref 39.0–52.0)
Hemoglobin: 15.7 g/dL (ref 13.0–17.0)
LYMPHS ABS: 2.5 10*3/uL (ref 0.7–4.0)
Lymphocytes Relative: 32 %
MCH: 27.9 pg (ref 26.0–34.0)
MCHC: 34.7 g/dL (ref 30.0–36.0)
MCV: 80.6 fL (ref 80.0–100.0)
MONOS PCT: 4 %
Monocytes Absolute: 0.3 10*3/uL (ref 0.1–1.0)
Neutro Abs: 4.3 10*3/uL (ref 1.7–7.7)
Neutrophils Relative %: 56 %
Platelets: 275 10*3/uL (ref 150–400)
RBC: 5.62 MIL/uL (ref 4.22–5.81)
RDW: 17.2 % — ABNORMAL HIGH (ref 11.5–15.5)
WBC: 7.7 10*3/uL (ref 4.0–10.5)
nRBC: 0 /100 WBC
nRBC: 0.3 % — ABNORMAL HIGH (ref 0.0–0.2)

## 2018-03-24 LAB — URINALYSIS, ROUTINE W REFLEX MICROSCOPIC
Bacteria, UA: NONE SEEN
Bilirubin Urine: NEGATIVE
Glucose, UA: >=500 mg/dL — AB
Ketones, ur: NEGATIVE mg/dL
Leukocytes, UA: NEGATIVE
Nitrite: NEGATIVE
Protein, ur: >=300 mg/dL — AB
SPECIFIC GRAVITY, URINE: 1.014 (ref 1.005–1.030)
pH: 5 (ref 5.0–8.0)

## 2018-03-24 LAB — SURGICAL PCR SCREEN
MRSA, PCR: NEGATIVE
Staphylococcus aureus: POSITIVE — AB

## 2018-03-24 LAB — COMPREHENSIVE METABOLIC PANEL
ALT: 65 U/L — ABNORMAL HIGH (ref 0–44)
AST: 75 U/L — ABNORMAL HIGH (ref 15–41)
Albumin: 4 g/dL (ref 3.5–5.0)
Alkaline Phosphatase: 60 U/L (ref 38–126)
Anion gap: 16 — ABNORMAL HIGH (ref 5–15)
BUN: 22 mg/dL — ABNORMAL HIGH (ref 6–20)
CHLORIDE: 107 mmol/L (ref 98–111)
CO2: 12 mmol/L — AB (ref 22–32)
Calcium: 10.7 mg/dL — ABNORMAL HIGH (ref 8.9–10.3)
Creatinine, Ser: 1.05 mg/dL (ref 0.61–1.24)
GFR calc Af Amer: >60 mL/min (ref >60)
GFR calc non Af Amer: >60 mL/min (ref >60)
Glucose, Bld: 195 mg/dL — ABNORMAL HIGH (ref 70–99)
Potassium: 5.2 mmol/L — ABNORMAL HIGH (ref 3.5–5.1)
Sodium: 135 mmol/L (ref 135–145)
Total Bilirubin: 2 mg/dL — ABNORMAL HIGH (ref 0.3–1.2)
Total Protein: 7.6 g/dL (ref 6.5–8.1)

## 2018-03-24 LAB — HEMOGLOBIN A1C
Hgb A1c MFr Bld: 7.9 % — ABNORMAL HIGH (ref 4.8–5.6)
Mean Plasma Glucose: 180.03 mg/dL

## 2018-03-24 LAB — PROTIME-INR
INR: 0.98
Prothrombin Time: 12.9 seconds (ref 11.4–15.2)

## 2018-03-24 LAB — GLUCOSE, CAPILLARY: Glucose-Capillary: 210 mg/dL — ABNORMAL HIGH (ref 70–99)

## 2018-03-24 LAB — APTT: aPTT: 29 seconds (ref 24–36)

## 2018-03-24 NOTE — Progress Notes (Addendum)
Patient will need repeat type and screen the day of surgery as he has anitbodies.  Order entered  PCR swab positive for Staph-patient will need treatment with betadine day of surgery per policy.

## 2018-03-25 LAB — TYPE AND SCREEN
ABO/RH(D): AB NEG
Antibody Screen: POSITIVE
DAT, IGG: POSITIVE

## 2018-03-25 NOTE — Progress Notes (Signed)
Received a call from blood bank in follow up from labs. Antibodies present on type and screen, patient has hx of lipemia and needs additional samples to be sent out for reference labs. Repeat type and screen to be drawn upon arrival. Results will be at least 24hr turn around. Dr Lynann Bologna made aware that blood would not be available at the time of surgery and is okay to proceed.

## 2018-03-25 NOTE — Progress Notes (Signed)
Anesthesia Chart Review:  Case:  [id number] Date/Time:  03/26/18 1300   Procedure:  ANTERIOR CERVICAL DECOMPRESSION FUSION CERVICAL 5-6 WITH INSTRUMENTATION AND ALLOGRAFT (N/A )   Anesthesia type:  General   Pre-op diagnosis:  LEFT C6 RADICULOPATHY SECONDARY TO A PROMINENT LEFT C5-C6 DISC HERNIATION   Location:  [facility] OR ROOM 05 / [place] OR   Surgeon:  Phylliss Bob, MD      DISCUSSION: 53 yo male never smoker. Pertinent hx includes IDDM, Non Hodgkin's lymphoma, NAFLD, Splenomegaly, Anemia, HTN, CAD (s/p CABG x 4 2014), Hypertryglyceridemia (3000-6000 range), Chronic LE edema.   Pt follows with Dr. Gwenlyn Found for CAD s/p CABG x 4 in 2014 and PCI with stent in 2018 as well as management of severe hypertryglyceridemia. Pt was last seen 01/07/2018. Per Dr. Kennon Holter note: "History of CAD s/p CABG x 4 02/05/13 with a LIMA to his LAD, left radial to an OM, vein graft to diagonal branch and PDA.  Because of anginal symptoms I performed cardiac catheterization on him 12/17/2016 revealing occluded vein to diagonal branch, patent left radial to an OM, patent LIMA to the LAD and patent vein to a PDA with high-grade disease in the mid and distal portions all of which I stented.  His symptoms improved after that.  He currently has had some mild recurrent symptoms occurring for minutes at a time on a weekly basis which he relates to increased stress.  I am going to renew his prescription for indoor 50 mg a day." Dr. Gwenlyn Found also noted new RBBB on EKG. Pt was also referred to Dr. Debara Pickett for further management of HLD.  Cardiac clearance 03/21/2018 by Kerin Ransom, PA-C states okay to hold ASA and Plavix 5-7d preop.  Anticiapte he can proceed as planned barring acute status change.   VS: BP (!) 142/90 Comment: manually recheck after PAT visit  Pulse 94   Temp 36.4 C   Ht 5\' 11"  (1.803 m)   Wt 104.8 kg   SpO2 97%   BMI 32.22 kg/m   PROVIDERS: [name], NP is PCP  Ancil Linsey, MD is Cardiologsit  LABS: Labs  reviewed: Acceptable for surgery. (all labs ordered are listed, but only abnormal results are displayed)  Labs Reviewed  SURGICAL PCR SCREEN - Abnormal; Notable for the following components:      Result Value   Staphylococcus aureus POSITIVE (*)    All other components within normal limits  GLUCOSE, CAPILLARY - Abnormal; Notable for the following components:   Glucose-Capillary 210 (*)    All other components within normal limits  CBC WITH DIFFERENTIAL/PLATELET - Abnormal; Notable for the following components:   RDW 17.2 (*)    nRBC 0.3 (*)    All other components within normal limits  COMPREHENSIVE METABOLIC PANEL - Abnormal; Notable for the following components:   Potassium 5.2 (*)    CO2 12 (*)    Glucose, Bld 195 (*)    BUN 22 (*)    Calcium 10.7 (*)    AST 75 (*)    ALT 65 (*)    Total Bilirubin 2.0 (*)    Anion gap 16 (*)    All other components within normal limits  URINALYSIS, ROUTINE W REFLEX MICROSCOPIC - Abnormal; Notable for the following components:   Color, Urine STRAW (*)    Glucose, UA >=500 (*)    Hgb urine dipstick SMALL (*)    Protein, ur >=300 (*)    All other components within normal limits  HEMOGLOBIN A1C -  Abnormal; Notable for the following components:   Hgb A1c MFr Bld 7.9 (*)    All other components within normal limits  APTT  PROTIME-INR  TYPE AND SCREEN     IMAGES: CHEST  2 VIEW 12/14/2016  COMPARISON:  03/04/2013  FINDINGS: Status post median sternotomy. Heart size is normal. There is mild perihilar peribronchial thickening, stable in appearance. No focal consolidations or pleural effusions. No pulmonary edema.  IMPRESSION: Stable bronchitic changes.  No focal acute pulmonary abnormality.  EKG: 01/09/2018: NSR. Rate 96. RBBB. LAFB. T wave abn, consider lateral ischemia.  CV: Cath and PCI 12/17/2016:  Prox RCA to Mid RCA lesion, 80 %stenosed.  Mid RCA lesion, 80 %stenosed.  Dist RCA lesion, 95 %stenosed.  Prox Cx lesion,  95 %stenosed.  Ost 1st Mrg to 1st Mrg lesion, 95 %stenosed.  Ost Cx to Prox Cx lesion, 70 %stenosed.  Mid Cx to Dist Cx lesion, 80 %stenosed.  Ost 1st Diag to 1st Diag lesion, 90 %stenosed.  Ost 2nd Diag to 2nd Diag lesion, 90 %stenosed.  Mid LAD to Dist LAD lesion, 90 %stenosed.  Dist LAD lesion, 100 %stenosed.  LIMA.  Left radial artery.  SVG.  Origin to Prox Graft lesion, 100 %stenosed.  SVG and is large and anatomically normal.  Mid Graft lesion, 80 %stenosed.  Prox Graft lesion, 80 %stenosed.  Post intervention, there is a 0% residual stenosis.  A stent was successfully placed.  Dist Graft to Insertion lesion, 95 %stenosed.  Post intervention, there is a 0% residual stenosis.  A stent was successfully placed.  The left ventricular systolic function is normal.  LV end diastolic pressure is normal.  The left ventricular ejection fraction is 50-55% by visual estimate.   TTE 03/01/2016: Study Conclusions  - Left ventricle: The cavity size was normal. Systolic function was   normal. The estimated ejection fraction was in the range of 60%   to 65%. Wall motion was normal; there were no regional wall   motion abnormalities. Features are consistent with a pseudonormal   left ventricular filling pattern, with concomitant abnormal   relaxation and increased filling pressure (grade 2 diastolic   dysfunction). Doppler parameters are consistent with elevated   ventricular end-diastolic filling pressure. - Aortic valve: Trileaflet; normal thickness leaflets. There was no   regurgitation. - Aortic root: The aortic root was normal in size. - Mitral valve: Structurally normal valve. There was trivial   regurgitation. - Left atrium: The atrium was mildly dilated. - Right ventricle: Systolic function was normal. - Right atrium: The atrium was normal in size. - Tricuspid valve: There was trivial regurgitation. - Pulmonary arteries: Systolic pressure was within the  normal   range. - Inferior vena cava: The vessel was normal in size. - Pericardium, extracardiac: There was no pericardial effusion.   Past Medical History:  Diagnosis Date  . Abnormal cardiovascular stress test 02/06/2013  . Anemia    occasional - no problems currently(10/02/2011)  . Bilateral renal cysts 06/16/2011   states no known problems  . Blood transfusion without reported diagnosis   . CAD (coronary artery disease), LAD 90%, 1st diag 95%, LCX 70% wth AV groove 90%, RCA 40-50% mid and 80% long distal stenosis 02/03/13 02/04/2013  . Chest pain    positive Myoview stress test  . Coronary artery calcification seen on CAT scan   . Diabetes mellitus    IDDM  . Fatty liver disease, nonalcoholic 9/44/9675  . Hyperlipidemia   . Hypertension    states  no dx. of HTN, takes med. to protect kidneys due to DM  . Lateral meniscus tear 09/2011   left  . Loose body in knee 09/2011   loose bodies left knee  . Non Hodgkin's lymphoma ([place]) 1991  . Pancreatitis    occasional - last episode 06/2011  . S/P CABG x 4, 02/05/13 LIMA-LAD; LT. RADIAL-OM;VG-DIAG; VG-PDA 02/06/2013   10/18 3/4 patent grafts (occluded SVG-->Diag), PCI/DESx 3 SVG-->RCA, normal EF  . Splenomegaly, congestive, chronic   . Stuffy and runny nose 10/02/2011   yellow drainage from nose    Past Surgical History:  Procedure Laterality Date  . ABDOMINAL AORTIC ANEURYSM REPAIR    . CORONARY ARTERY BYPASS GRAFT N/A 02/05/2013   Procedure: CORONARY ARTERY BYPASS GRAFTING (CABG);  Surgeon: Ivin Poot, MD;  Location: [place];  Service: Open Heart Surgery;  Laterality: N/A;  Coronary artery bypass graft on pump times four using left internal mammary artery and right greater saphenous vein via endovein harvest and left radial artery harvest.   . CORONARY STENT INTERVENTION  12/17/2016    PCI and drug-eluting stenting of the mid and distal RCA SVG   . CORONARY STENT INTERVENTION N/A 12/17/2016   Procedure: CORONARY STENT  INTERVENTION;  Surgeon: Lorretta Harp, MD;  Location: [place] [facility];  Service: Cardiovascular;  Laterality: N/A;  . ELBOW SURGERY    . HERNIA REPAIR     inguinal   . herniated disc    . INTRAOPERATIVE TRANSESOPHAGEAL ECHOCARDIOGRAM N/A 02/05/2013   Procedure: INTRAOPERATIVE TRANSESOPHAGEAL ECHOCARDIOGRAM;  Surgeon: Ivin Poot, MD;  Location: [place];  Service: Open Heart Surgery;  Laterality: N/A;  . KNEE ARTHROSCOPY  10/09/2011   Procedure: ARTHROSCOPY KNEE;  Surgeon: Nita Sells, MD;  Location: [place];  Service: Orthopedics;  Laterality: Left;  . LEFT HEART CATH AND CORS/GRAFTS ANGIOGRAPHY N/A 12/17/2016   Procedure: LEFT HEART CATH AND CORS/GRAFTS ANGIOGRAPHY;  Surgeon: Lorretta Harp, MD;  Location: [place] [facility];  Service: Cardiovascular;  Laterality: N/A;  . LEFT HEART CATHETERIZATION WITH CORONARY ANGIOGRAM N/A 02/03/2013   Procedure: LEFT HEART CATHETERIZATION WITH CORONARY ANGIOGRAM;  Surgeon: Lorretta Harp, MD;  Location: [facility] CATH LAB;  Service: Cardiovascular;  Laterality: N/A;  . NASAL SEPTUM SURGERY    . RADIAL ARTERY HARVEST Left 02/05/2013   Procedure: RADIAL ARTERY HARVEST;  Surgeon: Ivin Poot, MD;  Location: [place];  Service: Vascular;  Laterality: Left;  . TIBIA BONE BIOPSY  x 3   left  . TONSILLECTOMY      MEDICATIONS: . aspirin 81 MG chewable tablet  . clopidogrel (PLAVIX) 75 MG tablet  . Continuous Blood Gluc Receiver (FREESTYLE LIBRE 14 DAY READER) DEVI  . Continuous Blood Gluc Sensor (FREESTYLE LIBRE 14 DAY SENSOR) MISC  . FARXIGA 5 MG TABS tablet  . fenofibrate 160 MG tablet  . furosemide (LASIX) 40 MG tablet  . Icosapent Ethyl 1 g CAPS  . Insulin Human (INSULIN PUMP) SOLN  . insulin regular human CONCENTRATED (HUMULIN R) 500 UNIT/ML SOLN injection  . isosorbide mononitrate (IMDUR) 30 MG 24 hr tablet  . metFORMIN (GLUCOPHAGE) 1000 MG tablet  . metoprolol succinate (TOPROL-XL) 50 MG 24 hr tablet  .  nitroGLYCERIN (NITROSTAT) 0.4 MG SL tablet  . ramipril (ALTACE) 5 MG capsule  . simvastatin (ZOCOR) 10 MG tablet   No current facility-administered medications for this encounter.      Wynonia Musty [facility] Short Stay Center/Anesthesiology Phone [phone] 03/25/2018 9:00  AM     

## 2018-03-25 NOTE — Anesthesia Preprocedure Evaluation (Addendum)
Anesthesia Evaluation  Patient identified by MRN, date of birth, ID band Patient awake    Reviewed: Allergy & Precautions, NPO status , Patient's Chart, lab work & pertinent test results  History of Anesthesia Complications Negative for: history of anesthetic complications  Airway Mallampati: II  TM Distance: >3 FB Neck ROM: Limited    Dental  (+) Dental Advisory Given, Teeth Intact   Pulmonary sleep apnea ,    breath sounds clear to auscultation       Cardiovascular hypertension, Pt. on home beta blockers and Pt. on medications (-) angina+ CAD, + Cardiac Stents and + CABG   Rhythm:Regular Rate:Normal     Neuro/Psych negative neurological ROS  negative psych ROS   GI/Hepatic negative GI ROS, Neg liver ROS,   Endo/Other  diabetes, Type 2, Insulin Dependent, Oral Hypoglycemic Agents Obesity   Renal/GU Renal disease (cysts)     Musculoskeletal negative musculoskeletal ROS (+)   Abdominal   Peds  Hematology negative hematology ROS (+) anemia ,  Splenomegaly Non-Hodgkin's lymphoma    Anesthesia Other Findings   Reproductive/Obstetrics                            Anesthesia Physical Anesthesia Plan  ASA: III  Anesthesia Plan: General   Post-op Pain Management:    Induction: Intravenous  PONV Risk Score and Plan: 3 and Treatment may vary due to age or medical condition, Ondansetron and Midazolam  Airway Management Planned: Video Laryngoscope Planned  Additional Equipment: None  Intra-op Plan:   Post-operative Plan: Extubation in OR  Informed Consent: I have reviewed the patients History and Physical, chart, labs and discussed the procedure including the risks, benefits and alternatives for the proposed anesthesia with the patient or authorized representative who has indicated his/her understanding and acceptance.   Dental advisory given  Plan Discussed with: CRNA and  Anesthesiologist  Anesthesia Plan Comments:        Anesthesia Quick Evaluation

## 2018-03-26 ENCOUNTER — Encounter (HOSPITAL_COMMUNITY): Payer: Self-pay | Admitting: Certified Registered Nurse Anesthetist

## 2018-03-26 ENCOUNTER — Ambulatory Visit (HOSPITAL_COMMUNITY): Payer: Commercial Managed Care - PPO

## 2018-03-26 ENCOUNTER — Encounter (HOSPITAL_COMMUNITY)
Admission: RE | Disposition: A | Discharge status: Home / Self Care | Payer: Self-pay | Source: Home / Self Care | Attending: Orthopedic Surgery

## 2018-03-26 ENCOUNTER — Observation Stay (HOSPITAL_COMMUNITY)
Admission: RE | Admit: 2018-03-26 | Discharge: 2018-03-27 | Disposition: A | Discharge status: Home / Self Care | Payer: Commercial Managed Care - PPO | Attending: Orthopedic Surgery | Admitting: Orthopedic Surgery

## 2018-03-26 ENCOUNTER — Ambulatory Visit (HOSPITAL_COMMUNITY): Payer: Commercial Managed Care - PPO | Admitting: Physician Assistant

## 2018-03-26 ENCOUNTER — Ambulatory Visit (HOSPITAL_COMMUNITY): Payer: Commercial Managed Care - PPO | Admitting: Certified Registered Nurse Anesthetist

## 2018-03-26 DIAGNOSIS — M5410 Radiculopathy, site unspecified: Secondary | ICD-10-CM | POA: Diagnosis present

## 2018-03-26 DIAGNOSIS — E785 Hyperlipidemia, unspecified: Secondary | ICD-10-CM | POA: Insufficient documentation

## 2018-03-26 DIAGNOSIS — Z419 Encounter for procedure for purposes other than remedying health state, unspecified: Secondary | ICD-10-CM

## 2018-03-26 DIAGNOSIS — Z79899 Other long term (current) drug therapy: Secondary | ICD-10-CM | POA: Insufficient documentation

## 2018-03-26 DIAGNOSIS — E669 Obesity, unspecified: Secondary | ICD-10-CM | POA: Insufficient documentation

## 2018-03-26 DIAGNOSIS — Z8572 Personal history of non-Hodgkin lymphomas: Secondary | ICD-10-CM | POA: Insufficient documentation

## 2018-03-26 DIAGNOSIS — E119 Type 2 diabetes mellitus without complications: Secondary | ICD-10-CM | POA: Insufficient documentation

## 2018-03-26 DIAGNOSIS — M50122 Cervical disc disorder at C5-C6 level with radiculopathy: Secondary | ICD-10-CM | POA: Diagnosis not present

## 2018-03-26 DIAGNOSIS — M4322 Fusion of spine, cervical region: Secondary | ICD-10-CM | POA: Diagnosis not present

## 2018-03-26 DIAGNOSIS — Z7902 Long term (current) use of antithrombotics/antiplatelets: Secondary | ICD-10-CM | POA: Insufficient documentation

## 2018-03-26 DIAGNOSIS — I10 Essential (primary) hypertension: Secondary | ICD-10-CM | POA: Insufficient documentation

## 2018-03-26 DIAGNOSIS — Z7982 Long term (current) use of aspirin: Secondary | ICD-10-CM | POA: Insufficient documentation

## 2018-03-26 DIAGNOSIS — Z6832 Body mass index (BMI) 32.0-32.9, adult: Secondary | ICD-10-CM | POA: Insufficient documentation

## 2018-03-26 DIAGNOSIS — Z951 Presence of aortocoronary bypass graft: Secondary | ICD-10-CM | POA: Insufficient documentation

## 2018-03-26 DIAGNOSIS — Z794 Long term (current) use of insulin: Secondary | ICD-10-CM | POA: Insufficient documentation

## 2018-03-26 DIAGNOSIS — I2510 Atherosclerotic heart disease of native coronary artery without angina pectoris: Secondary | ICD-10-CM | POA: Insufficient documentation

## 2018-03-26 DIAGNOSIS — M4802 Spinal stenosis, cervical region: Secondary | ICD-10-CM | POA: Insufficient documentation

## 2018-03-26 DIAGNOSIS — M50322 Other cervical disc degeneration at C5-C6 level: Secondary | ICD-10-CM | POA: Diagnosis not present

## 2018-03-26 DIAGNOSIS — M5412 Radiculopathy, cervical region: Secondary | ICD-10-CM | POA: Diagnosis not present

## 2018-03-26 DIAGNOSIS — Z955 Presence of coronary angioplasty implant and graft: Secondary | ICD-10-CM | POA: Insufficient documentation

## 2018-03-26 DIAGNOSIS — G4730 Sleep apnea, unspecified: Secondary | ICD-10-CM | POA: Insufficient documentation

## 2018-03-26 HISTORY — PX: ANTERIOR CERVICAL DECOMP/DISCECTOMY FUSION: SHX1161

## 2018-03-26 LAB — GLUCOSE, CAPILLARY
GLUCOSE-CAPILLARY: 114 mg/dL — AB (ref 70–99)
Glucose-Capillary: 119 mg/dL — ABNORMAL HIGH (ref 70–99)
Glucose-Capillary: 129 mg/dL — ABNORMAL HIGH (ref 70–99)
Glucose-Capillary: 153 mg/dL — ABNORMAL HIGH (ref 70–99)
Glucose-Capillary: 200 mg/dL — ABNORMAL HIGH (ref 70–99)

## 2018-03-26 SURGERY — ANTERIOR CERVICAL DECOMPRESSION/DISCECTOMY FUSION 2 LEVELS
Anesthesia: General

## 2018-03-26 MED ORDER — DIAZEPAM 5 MG PO TABS
5.0000 mg | ORAL_TABLET | Freq: Four times a day (QID) | ORAL | Status: DC | PRN
  Administered 2018-03-26: 5 mg via ORAL

## 2018-03-26 MED ORDER — 0.9 % SODIUM CHLORIDE (POUR BTL) OPTIME
TOPICAL | Status: DC | PRN
  Administered 2018-03-26 (×2): 1000 mL

## 2018-03-26 MED ORDER — SODIUM CHLORIDE 0.9 % IV SOLN
INTRAVENOUS | Status: DC | PRN
  Administered 2018-03-26: 25 ug/min via INTRAVENOUS

## 2018-03-26 MED ORDER — MIDAZOLAM HCL 2 MG/2ML IJ SOLN
INTRAMUSCULAR | Status: AC
  Filled 2018-03-26: qty 2

## 2018-03-26 MED ORDER — MORPHINE SULFATE (PF) 2 MG/ML IV SOLN: 1.0000 mg | INTRAVENOUS | Status: DC | PRN

## 2018-03-26 MED ORDER — ONDANSETRON HCL 4 MG/2ML IJ SOLN: 4.0000 mg | Freq: Four times a day (QID) | INTRAMUSCULAR | Status: DC | PRN

## 2018-03-26 MED ORDER — BISACODYL 5 MG PO TBEC: 5.0000 mg | DELAYED_RELEASE_TABLET | Freq: Every day | ORAL | Status: DC | PRN

## 2018-03-26 MED ORDER — ONDANSETRON HCL 4 MG/2ML IJ SOLN
INTRAMUSCULAR | Status: DC | PRN
  Administered 2018-03-26: 4 mg via INTRAVENOUS

## 2018-03-26 MED ORDER — SODIUM CHLORIDE 0.9 % IV SOLN: 250.0000 mL | INTRAVENOUS | Status: DC

## 2018-03-26 MED ORDER — LACTATED RINGERS IV SOLN
INTRAVENOUS | Status: DC | PRN
  Administered 2018-03-26: 11:00:00 via INTRAVENOUS

## 2018-03-26 MED ORDER — FENTANYL CITRATE (PF) 100 MCG/2ML IJ SOLN
INTRAMUSCULAR | Status: DC | PRN
  Administered 2018-03-26: 100 ug via INTRAVENOUS
  Administered 2018-03-26: 50 ug via INTRAVENOUS

## 2018-03-26 MED ORDER — NITROGLYCERIN 0.4 MG SL SUBL: 0.4000 mg | SUBLINGUAL_TABLET | SUBLINGUAL | Status: DC | PRN

## 2018-03-26 MED ORDER — MIDAZOLAM HCL 5 MG/5ML IJ SOLN
INTRAMUSCULAR | Status: DC | PRN
  Administered 2018-03-26: 2 mg via INTRAVENOUS

## 2018-03-26 MED ORDER — FENOFIBRATE 160 MG PO TABS
160.0000 mg | ORAL_TABLET | Freq: Every day | ORAL | Status: DC
  Administered 2018-03-26: 160 mg via ORAL
  Filled 2018-03-26: qty 1

## 2018-03-26 MED ORDER — PROPOFOL 10 MG/ML IV BOLUS
INTRAVENOUS | Status: AC
  Filled 2018-03-26: qty 20

## 2018-03-26 MED ORDER — SIMVASTATIN 20 MG PO TABS: 10.0000 mg | ORAL_TABLET | Freq: Every day | ORAL | Status: DC

## 2018-03-26 MED ORDER — SODIUM CHLORIDE 0.9% FLUSH: 3.0000 mL | Freq: Two times a day (BID) | INTRAVENOUS | Status: DC

## 2018-03-26 MED ORDER — OXYCODONE-ACETAMINOPHEN 5-325 MG PO TABS
ORAL_TABLET | ORAL | Status: AC
  Filled 2018-03-26: qty 1

## 2018-03-26 MED ORDER — CEFAZOLIN SODIUM-DEXTROSE 2-4 GM/100ML-% IV SOLN
2.0000 g | Freq: Three times a day (TID) | INTRAVENOUS | Status: AC
  Administered 2018-03-26 – 2018-03-27 (×2): 2 g via INTRAVENOUS
  Filled 2018-03-26 (×2): qty 100

## 2018-03-26 MED ORDER — THROMBIN 20000 UNITS EX SOLR
CUTANEOUS | Status: DC | PRN
  Administered 2018-03-26: 20000 [IU] via TOPICAL

## 2018-03-26 MED ORDER — SENNOSIDES-DOCUSATE SODIUM 8.6-50 MG PO TABS: 1.0000 | ORAL_TABLET | Freq: Every evening | ORAL | Status: DC | PRN

## 2018-03-26 MED ORDER — PANTOPRAZOLE SODIUM 40 MG PO TBEC
40.0000 mg | DELAYED_RELEASE_TABLET | Freq: Every day | ORAL | Status: DC
  Administered 2018-03-26: 40 mg via ORAL
  Filled 2018-03-26: qty 1

## 2018-03-26 MED ORDER — METFORMIN HCL 500 MG PO TABS: 1000.0000 mg | ORAL_TABLET | Freq: Two times a day (BID) | ORAL | Status: DC

## 2018-03-26 MED ORDER — OMEGA-3-ACID ETHYL ESTERS 1 G PO CAPS
1.0000 g | ORAL_CAPSULE | Freq: Two times a day (BID) | ORAL | Status: DC
  Administered 2018-03-26: 1 g via ORAL
  Filled 2018-03-26 (×2): qty 1

## 2018-03-26 MED ORDER — ACETAMINOPHEN 650 MG RE SUPP: 650.0000 mg | RECTAL | Status: DC | PRN

## 2018-03-26 MED ORDER — BUPIVACAINE-EPINEPHRINE 0.25% -1:200000 IJ SOLN
INTRAMUSCULAR | Status: DC | PRN
  Administered 2018-03-26: 10 mL

## 2018-03-26 MED ORDER — RAMIPRIL 5 MG PO CAPS
5.0000 mg | ORAL_CAPSULE | Freq: Every day | ORAL | Status: DC
  Administered 2018-03-26: 5 mg via ORAL
  Filled 2018-03-26 (×2): qty 1

## 2018-03-26 MED ORDER — CANAGLIFLOZIN 100 MG PO TABS
100.0000 mg | ORAL_TABLET | Freq: Every day | ORAL | Status: DC
  Administered 2018-03-27: 100 mg via ORAL
  Filled 2018-03-26: qty 1

## 2018-03-26 MED ORDER — SODIUM CHLORIDE 0.9% FLUSH: 3.0000 mL | INTRAVENOUS | Status: DC | PRN

## 2018-03-26 MED ORDER — PANTOPRAZOLE SODIUM 40 MG IV SOLR: 40.0000 mg | Freq: Every day | INTRAVENOUS | Status: DC

## 2018-03-26 MED ORDER — FLEET ENEMA 7-19 GM/118ML RE ENEM: 1.0000 | ENEMA | Freq: Once | RECTAL | Status: DC | PRN

## 2018-03-26 MED ORDER — HEMOSTATIC AGENTS (NO CHARGE) OPTIME
TOPICAL | Status: DC | PRN
  Administered 2018-03-26: 1 via TOPICAL

## 2018-03-26 MED ORDER — ONDANSETRON HCL 4 MG/2ML IJ SOLN
INTRAMUSCULAR | Status: AC
  Filled 2018-03-26: qty 2

## 2018-03-26 MED ORDER — ACETAMINOPHEN 325 MG PO TABS: 650.0000 mg | ORAL_TABLET | ORAL | Status: DC | PRN

## 2018-03-26 MED ORDER — DEXAMETHASONE SODIUM PHOSPHATE 10 MG/ML IJ SOLN
INTRAMUSCULAR | Status: AC
  Filled 2018-03-26: qty 1

## 2018-03-26 MED ORDER — PROPOFOL 10 MG/ML IV BOLUS
INTRAVENOUS | Status: DC | PRN
  Administered 2018-03-26: 200 mg via INTRAVENOUS

## 2018-03-26 MED ORDER — ALUM & MAG HYDROXIDE-SIMETH 200-200-20 MG/5ML PO SUSP: 30.0000 mL | Freq: Four times a day (QID) | ORAL | Status: DC | PRN

## 2018-03-26 MED ORDER — SUGAMMADEX SODIUM 200 MG/2ML IV SOLN
INTRAVENOUS | Status: DC | PRN
  Administered 2018-03-26: 200 mg via INTRAVENOUS

## 2018-03-26 MED ORDER — ICOSAPENT ETHYL 1 G PO CAPS: 2.0000 g | ORAL_CAPSULE | Freq: Two times a day (BID) | ORAL | Status: DC

## 2018-03-26 MED ORDER — ROCURONIUM BROMIDE 50 MG/5ML IV SOSY
PREFILLED_SYRINGE | INTRAVENOUS | Status: AC
  Filled 2018-03-26: qty 15

## 2018-03-26 MED ORDER — DIAZEPAM 5 MG PO TABS
ORAL_TABLET | ORAL | Status: AC
  Filled 2018-03-26: qty 1

## 2018-03-26 MED ORDER — FUROSEMIDE 40 MG PO TABS
40.0000 mg | ORAL_TABLET | Freq: Every day | ORAL | Status: DC
  Administered 2018-03-26: 40 mg via ORAL
  Filled 2018-03-26: qty 1

## 2018-03-26 MED ORDER — CEFAZOLIN SODIUM-DEXTROSE 2-4 GM/100ML-% IV SOLN
INTRAVENOUS | Status: AC
  Filled 2018-03-26: qty 100

## 2018-03-26 MED ORDER — BUPIVACAINE-EPINEPHRINE (PF) 0.25% -1:200000 IJ SOLN
INTRAMUSCULAR | Status: AC
  Filled 2018-03-26: qty 30

## 2018-03-26 MED ORDER — MENTHOL 3 MG MT LOZG: 1.0000 | LOZENGE | OROMUCOSAL | Status: DC | PRN

## 2018-03-26 MED ORDER — THROMBIN (RECOMBINANT) 20000 UNITS EX SOLR
CUTANEOUS | Status: AC
  Filled 2018-03-26: qty 20000

## 2018-03-26 MED ORDER — LIDOCAINE 2% (20 MG/ML) 5 ML SYRINGE
INTRAMUSCULAR | Status: AC
  Filled 2018-03-26: qty 5

## 2018-03-26 MED ORDER — INSULIN PUMP
Freq: Three times a day (TID) | SUBCUTANEOUS | Status: DC
  Administered 2018-03-26: 17.5 via SUBCUTANEOUS
  Administered 2018-03-26: 3 via SUBCUTANEOUS
  Filled 2018-03-26: qty 1

## 2018-03-26 MED ORDER — METOPROLOL SUCCINATE ER 50 MG PO TB24: 50.0000 mg | ORAL_TABLET | Freq: Every day | ORAL | Status: DC

## 2018-03-26 MED ORDER — INSULIN PUMP
Freq: Three times a day (TID) | SUBCUTANEOUS | Status: DC
  Filled 2018-03-26: qty 1

## 2018-03-26 MED ORDER — OXYCODONE-ACETAMINOPHEN 5-325 MG PO TABS
1.0000 | ORAL_TABLET | ORAL | Status: DC | PRN
  Administered 2018-03-26: 2 via ORAL
  Administered 2018-03-26: 1 via ORAL
  Filled 2018-03-26: qty 2

## 2018-03-26 MED ORDER — ZOLPIDEM TARTRATE 5 MG PO TABS: 5.0000 mg | ORAL_TABLET | Freq: Every evening | ORAL | Status: DC | PRN

## 2018-03-26 MED ORDER — ONDANSETRON HCL 4 MG PO TABS: 4.0000 mg | ORAL_TABLET | Freq: Four times a day (QID) | ORAL | Status: DC | PRN

## 2018-03-26 MED ORDER — FENTANYL CITRATE (PF) 250 MCG/5ML IJ SOLN
INTRAMUSCULAR | Status: AC
  Filled 2018-03-26: qty 5

## 2018-03-26 MED ORDER — POVIDONE-IODINE 7.5 % EX SOLN
Freq: Once | CUTANEOUS | Status: DC
  Filled 2018-03-26: qty 118

## 2018-03-26 MED ORDER — PHENYLEPHRINE 40 MCG/ML (10ML) SYRINGE FOR IV PUSH (FOR BLOOD PRESSURE SUPPORT)
PREFILLED_SYRINGE | INTRAVENOUS | Status: AC
  Filled 2018-03-26: qty 10

## 2018-03-26 MED ORDER — ISOSORBIDE MONONITRATE ER 30 MG PO TB24
15.0000 mg | ORAL_TABLET | Freq: Every day | ORAL | Status: DC | PRN
  Filled 2018-03-26: qty 1

## 2018-03-26 MED ORDER — PHENOL 1.4 % MT LIQD
1.0000 | OROMUCOSAL | Status: DC | PRN
  Filled 2018-03-26: qty 177

## 2018-03-26 MED ORDER — CEFAZOLIN SODIUM-DEXTROSE 2-4 GM/100ML-% IV SOLN
2.0000 g | INTRAVENOUS | Status: AC
  Administered 2018-03-26: 2 g via INTRAVENOUS

## 2018-03-26 MED ORDER — ROCURONIUM BROMIDE 50 MG/5ML IV SOSY
PREFILLED_SYRINGE | INTRAVENOUS | Status: DC | PRN
  Administered 2018-03-26 (×2): 20 mg via INTRAVENOUS
  Administered 2018-03-26: 50 mg via INTRAVENOUS

## 2018-03-26 MED ORDER — PHENYLEPHRINE 40 MCG/ML (10ML) SYRINGE FOR IV PUSH (FOR BLOOD PRESSURE SUPPORT)
PREFILLED_SYRINGE | INTRAVENOUS | Status: DC | PRN
  Administered 2018-03-26: 80 ug via INTRAVENOUS

## 2018-03-26 MED ORDER — LIDOCAINE 2% (20 MG/ML) 5 ML SYRINGE
INTRAMUSCULAR | Status: DC | PRN
  Administered 2018-03-26: 80 mg via INTRAVENOUS

## 2018-03-26 SURGICAL SUPPLY — 79 items
BENZOIN TINCTURE PRP APPL 2/3 (GAUZE/BANDAGES/DRESSINGS) ×3 IMPLANT
BIT DRILL NEURO 2X3.1 SFT TUCH (MISCELLANEOUS) ×1 IMPLANT
BIT DRILL SRG 14X2.2XFLT CHK (BIT) IMPLANT
BIT DRL SRG 14X2.2XFLT CHK (BIT) ×1
BLADE CLIPPER SURG (BLADE) ×3 IMPLANT
BLADE SURG 15 STRL LF DISP TIS (BLADE) ×1 IMPLANT
BLADE SURG 15 STRL SS (BLADE) ×2
BONE VIVIGEN FORMABLE 1.3CC (Bone Implant) ×3 IMPLANT
BUR MATCHSTICK NEURO 3.0 LAGG (BURR) IMPLANT
CARTRIDGE OIL MAESTRO DRILL (MISCELLANEOUS) ×1 IMPLANT
CLOSURE WOUND 1/2 X4 (GAUZE/BANDAGES/DRESSINGS) ×1
COLLAR CERV LO CONTOUR FIRM DE (SOFTGOODS) IMPLANT
CORDS BIPOLAR (ELECTRODE) ×3 IMPLANT
COVER SURGICAL LIGHT HANDLE (MISCELLANEOUS) ×3 IMPLANT
COVER WAND RF STERILE (DRAPES) ×3 IMPLANT
CRADLE DONUT ADULT HEAD (MISCELLANEOUS) ×3 IMPLANT
DECANTER SPIKE VIAL GLASS SM (MISCELLANEOUS) ×3 IMPLANT
DIFFUSER DRILL AIR PNEUMATIC (MISCELLANEOUS) ×3 IMPLANT
DRAIN JACKSON RD 7FR 3/32 (WOUND CARE) IMPLANT
DRAPE C-ARM 42X72 X-RAY (DRAPES) ×3 IMPLANT
DRAPE POUCH INSTRU U-SHP 10X18 (DRAPES) ×3 IMPLANT
DRAPE SURG 17X23 STRL (DRAPES) ×12 IMPLANT
DRILL BIT SKYLINE 14MM (BIT) ×2
DRILL NEURO 2X3.1 SOFT TOUCH (MISCELLANEOUS) ×3
DURAPREP 26ML APPLICATOR (WOUND CARE) ×3 IMPLANT
ELECT COATED BLADE 2.86 ST (ELECTRODE) ×3 IMPLANT
ELECT REM PT RETURN 9FT ADLT (ELECTROSURGICAL) ×3
ELECTRODE REM PT RTRN 9FT ADLT (ELECTROSURGICAL) ×1 IMPLANT
EVACUATOR SILICONE 100CC (DRAIN) IMPLANT
GAUZE 4X4 16PLY RFD (DISPOSABLE) ×3 IMPLANT
GAUZE SPONGE 4X4 12PLY STRL (GAUZE/BANDAGES/DRESSINGS) ×3 IMPLANT
GAUZE SPONGE 4X4 12PLY STRL LF (GAUZE/BANDAGES/DRESSINGS) ×2 IMPLANT
GLOVE BIO SURGEON STRL SZ7 (GLOVE) ×3 IMPLANT
GLOVE BIO SURGEON STRL SZ8 (GLOVE) ×3 IMPLANT
GLOVE BIOGEL PI IND STRL 7.0 (GLOVE) ×2 IMPLANT
GLOVE BIOGEL PI IND STRL 8 (GLOVE) ×1 IMPLANT
GLOVE BIOGEL PI INDICATOR 7.0 (GLOVE) ×4
GLOVE BIOGEL PI INDICATOR 8 (GLOVE) ×2
GOWN STRL REUS W/ TWL LRG LVL3 (GOWN DISPOSABLE) ×1 IMPLANT
GOWN STRL REUS W/ TWL XL LVL3 (GOWN DISPOSABLE) ×1 IMPLANT
GOWN STRL REUS W/TWL LRG LVL3 (GOWN DISPOSABLE) ×2
GOWN STRL REUS W/TWL XL LVL3 (GOWN DISPOSABLE) ×2
GRAFT BNE MATRIX VG FRMBL SM 1 (Bone Implant) IMPLANT
INTERLOCK LRDTC CRVCL VBR 7MM (Bone Implant) IMPLANT
IV CATH 14GX2 1/4 (CATHETERS) ×3 IMPLANT
KIT BASIN OR (CUSTOM PROCEDURE TRAY) ×3 IMPLANT
KIT TURNOVER KIT B (KITS) ×3 IMPLANT
LORDOTIC CERVICAL VBR 7MM SM (Bone Implant) ×3 IMPLANT
MANIFOLD NEPTUNE II (INSTRUMENTS) ×3 IMPLANT
NDL PRECISIONGLIDE 27X1.5 (NEEDLE) ×1 IMPLANT
NDL SPNL 20GX3.5 QUINCKE YW (NEEDLE) ×1 IMPLANT
NEEDLE PRECISIONGLIDE 27X1.5 (NEEDLE) ×3 IMPLANT
NEEDLE SPNL 20GX3.5 QUINCKE YW (NEEDLE) ×3 IMPLANT
NS IRRIG 1000ML POUR BTL (IV SOLUTION) ×3 IMPLANT
OIL CARTRIDGE MAESTRO DRILL (MISCELLANEOUS) ×3
PACK ORTHO CERVICAL (CUSTOM PROCEDURE TRAY) ×3 IMPLANT
PAD ARMBOARD 7.5X6 YLW CONV (MISCELLANEOUS) ×6 IMPLANT
PATTIES SURGICAL .5 X.5 (GAUZE/BANDAGES/DRESSINGS) IMPLANT
PATTIES SURGICAL .5 X1 (DISPOSABLE) ×3 IMPLANT
PIN DISTRACTION 14 (PIN) ×4 IMPLANT
PLATE SKYLINE 12MM (Plate) ×2 IMPLANT
SCREW SKYLINE VAR OS 14MM (Screw) ×8 IMPLANT
SPONGE INTESTINAL PEANUT (DISPOSABLE) ×3 IMPLANT
SPONGE SURGIFOAM ABS GEL 100 (HEMOSTASIS) ×3 IMPLANT
STRIP CLOSURE SKIN 1/2X4 (GAUZE/BANDAGES/DRESSINGS) ×2 IMPLANT
SURGIFLO W/THROMBIN 8M KIT (HEMOSTASIS) IMPLANT
SUT MNCRL AB 4-0 PS2 18 (SUTURE) ×3 IMPLANT
SUT SILK 4 0 (SUTURE)
SUT SILK 4-0 18XBRD TIE 12 (SUTURE) IMPLANT
SUT VIC AB 2-0 CT2 18 VCP726D (SUTURE) ×3 IMPLANT
SYR BULB IRRIGATION 50ML (SYRINGE) ×3 IMPLANT
SYR CONTROL 10ML LL (SYRINGE) ×9 IMPLANT
TAPE CLOTH 4X10 WHT NS (GAUZE/BANDAGES/DRESSINGS) ×3 IMPLANT
TAPE CLOTH SURG 4X10 WHT LF (GAUZE/BANDAGES/DRESSINGS) ×2 IMPLANT
TAPE UMBILICAL COTTON 1/8X30 (MISCELLANEOUS) ×3 IMPLANT
TOWEL OR 17X24 6PK STRL BLUE (TOWEL DISPOSABLE) ×3 IMPLANT
TOWEL OR 17X26 10 PK STRL BLUE (TOWEL DISPOSABLE) ×3 IMPLANT
WATER STERILE IRR 1000ML POUR (IV SOLUTION) ×3 IMPLANT
YANKAUER SUCT BULB TIP NO VENT (SUCTIONS) ×3 IMPLANT

## 2018-03-26 NOTE — Op Note (Signed)
NAME: Juan [name]              MEDICAL RECORD NO.: [id number]    PHYSICIAN:  Phylliss Bob, MD      DATE OF BIRTH:  [birth year]   DATE OF PROCEDURE:  03/26/2018                               OPERATIVE REPORT   PREOPERATIVE DIAGNOSES: 1. Left-sided cervical radiculopathy. 2. Spinal stenosis, C5/6, with large left C5/6 disc hernation  POSTOPERATIVE DIAGNOSES: 1. Left-sided cervical radiculopathy. 2. Spinal stenosis, C5/6, with large left C5/6 disc hernation  PROCEDURE: 1. Anterior cervical decompression and fusion C5/6 2. Placement of anterior instrumentation, C5/6 3. Insertion of interbody device x1 (106mm Titan intervertebral spacer). 4. Intraoperative use of fluoroscopy. 5. Use of morselized allograft - ViviGen.  SURGEON:  Phylliss Bob, MD  ASSISTANT:  Pricilla Holm, PA-C.  ANESTHESIA:  General endotracheal anesthesia.  COMPLICATIONS:  None.  DISPOSITION:  Stable.  ESTIMATED BLOOD LOSS:  Minimal.  INDICATIONS FOR SURGERY:  Briefly, Juan [name] is a pleasant 53 -year- old male, who did present to me with severe pain in his neck and left arm.   The patient's MRI did reveal the findings noted above.  Given the ongoing rather debilitating pain and lack of improvement with appropriate treatment measures, we did discuss proceeding with the procedure noted above.  The patient was fully aware of the risks and limitations of surgery as outlined in my preoperative note.  OPERATIVE DETAILS:  On 03/26/2018 , the patient was brought to surgery and general endotracheal anesthesia was administered.  The patient was placed supine on the hospital bed. The neck was gently extended.  All bony prominences were meticulously padded.  The neck was prepped and draped in the usual sterile fashion.  At this point, I did make a left-sided transverse incision.  The platysma was incised.  A Smith-Robinson approach was used and the anterior spine was identified. A self-retaining  retractor was placed.  I then subperiosteally exposed the vertebral bodies from C5-C6.  Caspar pins were then placed into the C5 and C6 vertebral bodies and distraction was applied.  A thorough and complete C5-6 intervertebral diskectomy was performed.  The posterior longitudinal ligament was identified and entered using a nerve hook.  I then used #1 followed by #2 Kerrison to perform a thorough and complete intervertebral diskectomy.  The spinal canal was thoroughly decompressed, as was the left neuroforamen.  The endplates were then prepared and the appropriate-sized intervertebral spacer was then packed with ViviGen and tamped into position in the usual fashion. The Caspar pins  then were removed and bone wax was placed in their place.  The appropriate-sized anterior cervical plate was placed over the anterior spine. 12 mm variable angle screws were placed, 2 in each vertebral body from C5-C6 for a total of 4 vertebral body screws.  The screws were then locked to the plate using the Cam locking mechanism.  I was very pleased with the final fluoroscopic images.  The wound was then irrigated.  The wound was then explored for any undue bleeding and there was no bleeding noted. The wound was then closed in layers using 2-0 Vicryl, followed by 4-0 Monocryl.  Benzoin and Steri-Strips were applied, followed by sterile dressing.  All instrument counts were correct at the termination of the procedure.  Of note, Pricilla Holm, PA-C, was my assistant throughout surgery, and did aid in  retraction, suctioning, and closure from start to finish.     Phylliss Bob, MD

## 2018-03-26 NOTE — H&P (Signed)
PREOPERATIVE H&P  Chief Complaint: Left arm pain  HPI: Juan [name] is a 53 y.o. male who presents with ongoing pain in the left arm  MRI reveals severe NF stenosis on the left at C5/6  Patient has failed multiple forms of conservative care and continues to have pain (see office notes for additional details regarding the patient's full course of treatment)  Past Medical History:  Diagnosis Date  . Abnormal cardiovascular stress test 02/06/2013  . Anemia    occasional - no problems currently(10/02/2011)  . Bilateral renal cysts 06/16/2011   states no known problems  . Blood transfusion without reported diagnosis   . CAD (coronary artery disease), LAD 90%, 1st diag 95%, LCX 70% wth AV groove 90%, RCA 40-50% mid and 80% long distal stenosis 02/03/13 02/04/2013  . Chest pain    positive Myoview stress test  . Coronary artery calcification seen on CAT scan   . Diabetes mellitus    IDDM  . Fatty liver disease, nonalcoholic 09/09/7626  . Hyperlipidemia   . Hypertension    states no dx. of HTN, takes med. to protect kidneys due to DM  . Lateral meniscus tear 09/2011   left  . Loose body in knee 09/2011   loose bodies left knee  . Non Hodgkin's lymphoma ([place]) 1991  . Pancreatitis    occasional - last episode 06/2011  . S/P CABG x 4, 02/05/13 LIMA-LAD; LT. RADIAL-OM;VG-DIAG; VG-PDA 02/06/2013   10/18 3/4 patent grafts (occluded SVG-->Diag), PCI/DESx 3 SVG-->RCA, normal EF  . Splenomegaly, congestive, chronic   . Stuffy and runny nose 10/02/2011   yellow drainage from nose   Past Surgical History:  Procedure Laterality Date  . ABDOMINAL AORTIC ANEURYSM REPAIR    . CORONARY ARTERY BYPASS GRAFT N/A 02/05/2013   Procedure: CORONARY ARTERY BYPASS GRAFTING (CABG);  Surgeon: Ivin Poot, MD;  Location: [facility];  Service: Open Heart Surgery;  Laterality: N/A;  Coronary artery bypass graft on pump times four using left internal mammary artery and right greater saphenous vein via  endovein harvest and left radial artery harvest.   . CORONARY STENT INTERVENTION  12/17/2016    PCI and drug-eluting stenting of the mid and distal RCA SVG   . CORONARY STENT INTERVENTION N/A 12/17/2016   Procedure: CORONARY STENT INTERVENTION;  Surgeon: Lorretta Harp, MD;  Location: [facility];  Service: Cardiovascular;  Laterality: N/A;  . ELBOW SURGERY    . HERNIA REPAIR     inguinal   . herniated disc    . INTRAOPERATIVE TRANSESOPHAGEAL ECHOCARDIOGRAM N/A 02/05/2013   Procedure: INTRAOPERATIVE TRANSESOPHAGEAL ECHOCARDIOGRAM;  Surgeon: Ivin Poot, MD;  Location: [facility];  Service: Open Heart Surgery;  Laterality: N/A;  . KNEE ARTHROSCOPY  10/09/2011   Procedure: ARTHROSCOPY KNEE;  Surgeon: Nita Sells, MD;  Location: [facility];  Service: Orthopedics;  Laterality: Left;  . LEFT HEART CATH AND CORS/GRAFTS ANGIOGRAPHY N/A 12/17/2016   Procedure: LEFT HEART CATH AND CORS/GRAFTS ANGIOGRAPHY;  Surgeon: Lorretta Harp, MD;  Location: [facility];  Service: Cardiovascular;  Laterality: N/A;  . LEFT HEART CATHETERIZATION WITH CORONARY ANGIOGRAM N/A 02/03/2013   Procedure: LEFT HEART CATHETERIZATION WITH CORONARY ANGIOGRAM;  Surgeon: Lorretta Harp, MD;  Location: [facility] CATH LAB;  Service: Cardiovascular;  Laterality: N/A;  . NASAL SEPTUM SURGERY    . RADIAL ARTERY HARVEST Left 02/05/2013   Procedure: RADIAL ARTERY HARVEST;  Surgeon: Ivin Poot, MD;  Location: [facility];  Service: Vascular;  Laterality: Left;  . TIBIA BONE BIOPSY  x 3   left  . TONSILLECTOMY     Social History   Socioeconomic History  . Marital status: Married    Spouse name: Juan [name]  . Number of children: 1  . Years of education: 67  . Highest education level: Not on file  Occupational History  . Occupation: Engineer, drilling     Employer: [employer]  Social Needs  . Financial resource strain: Not on file  . Food insecurity:    Worry: Not on file     Inability: Not on file  . Transportation needs:    Medical: Not on file    Non-medical: Not on file  Tobacco Use  . Smoking status: Never Smoker  . Smokeless tobacco: Never Used  Substance and Sexual Activity  . Alcohol use: No  . Drug use: No  . Sexual activity: Yes  Lifestyle  . Physical activity:    Days per week: Not on file    Minutes per session: Not on file  . Stress: Not on file  Relationships  . Social connections:    Talks on phone: Not on file    Gets together: Not on file    Attends religious service: Not on file    Active member of club or organization: Not on file    Attends meetings of clubs or organizations: Not on file    Relationship status: Not on file  Other Topics Concern  . Not on file  Social History Narrative   Adopted, so no pertinent FH.  Married.  Lives with wife in [place].  Independent of ADLs and ambulation.   Family History  Adopted: Yes   Allergies  Allergen Reactions  . Reglan [Metoclopramide] Other (See Comments)    Only can tolerate in low doses, in higher doses it has the opposite effect   Prior to Admission medications   Medication Sig Start Date End Date Taking? Authorizing Provider  aspirin 81 MG chewable tablet Chew 1 tablet (81 mg total) by mouth daily. 12/19/16  Yes Cheryln Manly, NP  clopidogrel (PLAVIX) 75 MG tablet Take 1 tablet (75 mg total) by mouth daily with breakfast. 12/27/16  Yes Strader, [place] M, PA-C  FARXIGA 5 MG TABS tablet Take 5 mg by mouth daily. 09/06/15  Yes [provider]  fenofibrate 160 MG tablet Take 160 mg by mouth daily.    Yes [provider]  furosemide (LASIX) 40 MG tablet Take 1 tablet (40 mg total) by mouth daily. 11/27/17  Yes Lorretta Harp, MD  Icosapent Ethyl 1 g CAPS Take 2 capsules (2 g total) by mouth 2 (two) times daily. 02/07/18  Yes Hilty, Nadean Corwin, MD  Insulin Human (INSULIN PUMP) SOLN Inject into the skin.   Yes [provider]  insulin regular  human CONCENTRATED (HUMULIN R) 500 UNIT/ML SOLN injection Inject into the skin continuous. Using insulin pump   Yes [provider]  isosorbide mononitrate (IMDUR) 30 MG 24 hr tablet Take 0.5 tablets (15 mg total) by mouth daily. Patient taking differently: Take 15 mg by mouth daily as needed (chest pain).  01/07/18 07/06/18 Yes Lorretta Harp, MD  metFORMIN (GLUCOPHAGE) 1000 MG tablet Take 1,000 mg by mouth 2 (two) times daily with a meal.   Yes [provider]  metoprolol succinate (TOPROL-XL) 50 MG 24 hr tablet TAKE 1 TABLET EVERY DAY WITH OR IMMEDIATELY Patient taking differently: Take 50 mg  by mouth daily.  06/03/17  Yes Lorretta Harp, MD  nitroGLYCERIN (NITROSTAT) 0.4 MG SL tablet Place 1 tablet (0.4 mg total) under the tongue every 5 (five) minutes as needed. 12/18/16  Yes Reino Bellis B, NP  ramipril (ALTACE) 5 MG capsule Take 5 mg by mouth daily. 06/23/15  Yes [provider]  simvastatin (ZOCOR) 10 MG tablet TAKE 1 TABLET EVERY DAY Patient taking differently: Take 10 mg by mouth daily.  06/03/17  Yes Lorretta Harp, MD  Continuous Blood Gluc Receiver (FREESTYLE LIBRE 14 DAY READER) DEVI daily. as directed 04/14/17   [provider]  Continuous Blood Gluc Sensor (FREESTYLE LIBRE 14 DAY SENSOR) MISC USE AS DIRECTED EVERY 14 DAYS 05/11/17   [provider]     All other systems have been reviewed and were otherwise negative with the exception of those mentioned in the HPI and as above.  Physical Exam: There were no vitals filed for this visit.  There is no height or weight on file to calculate BMI.  General: Alert, no acute distress Cardiovascular: No pedal edema Respiratory: No cyanosis, no use of accessory musculature Skin: No lesions in the area of chief complaint Neurologic: Sensation intact distally Psychiatric: Patient is competent for consent with normal mood and affect Lymphatic: No axillary or cervical  lymphadenopathy  MUSCULOSKELETAL: + spurling's sign on the left   Assessment/Plan: LEFT C6 RADICULOPATHY SECONDARY TO A PROMINENT LEFT C5-C6 [place] for Procedure(s): ANTERIOR CERVICAL DECOMPRESSION FUSION CERVICAL 5-6 WITH INSTRUMENTATION AND ALLOGRAFT   Norva Karvonen, MD 03/26/2018 8:15 AM

## 2018-03-26 NOTE — Transfer of Care (Signed)
Immediate Anesthesia Transfer of Care Note  Patient: Juan [name]  Procedure(s) Performed: ANTERIOR CERVICAL DECOMPRESSION FUSION CERVICAL 5-6 WITH INSTRUMENTATION AND ALLOGRAFT (N/A )  Patient Location: PACU  Anesthesia Type:General  Level of Consciousness: drowsy  Airway & Oxygen Therapy: Patient Spontanous Breathing and Patient connected to nasal cannula oxygen  Post-op Assessment: Report given to RN and Post -op Vital signs reviewed and stable  Post vital signs: Reviewed and stable  Last Vitals:  Vitals Value Taken Time  BP 116/67 03/26/2018  2:25 PM  Temp    Pulse 87 03/26/2018  2:29 PM  Resp 22 03/26/2018  2:29 PM  SpO2 95 % 03/26/2018  2:29 PM  Vitals shown include unvalidated device data.  Last Pain:  Vitals:   03/26/18 1016  PainSc: 0-No pain      Patients Stated Pain Goal: 3 (18/56/31 4970)  Complications: No apparent anesthesia complications

## 2018-03-26 NOTE — Anesthesia Procedure Notes (Signed)
Procedure Name: Intubation Date/Time: 03/26/2018 12:28 PM Performed by: Candis Shine, CRNA Pre-anesthesia Checklist: Patient identified, Emergency Drugs available, Suction available and Patient being monitored Patient Re-evaluated:Patient Re-evaluated prior to induction Oxygen Delivery Method: Circle System Utilized Preoxygenation: Pre-oxygenation with 100% oxygen Induction Type: IV induction Ventilation: Mask ventilation without difficulty Laryngoscope Size: Glidescope and 4 Grade View: Grade I Tube type: Oral Tube size: 7.5 mm Number of attempts: 1 Airway Equipment and Method: Video-laryngoscopy and Rigid stylet Placement Confirmation: ETT inserted through vocal cords under direct vision,  positive ETCO2 and breath sounds checked- equal and bilateral Secured at: 23 cm Tube secured with: Tape Dental Injury: Teeth and Oropharynx as per pre-operative assessment

## 2018-03-27 ENCOUNTER — Other Ambulatory Visit: Payer: Self-pay

## 2018-03-27 ENCOUNTER — Encounter (HOSPITAL_COMMUNITY): Payer: Self-pay | Admitting: Orthopedic Surgery

## 2018-03-27 DIAGNOSIS — Z8572 Personal history of non-Hodgkin lymphomas: Secondary | ICD-10-CM | POA: Diagnosis not present

## 2018-03-27 DIAGNOSIS — M50122 Cervical disc disorder at C5-C6 level with radiculopathy: Secondary | ICD-10-CM | POA: Diagnosis not present

## 2018-03-27 DIAGNOSIS — E669 Obesity, unspecified: Secondary | ICD-10-CM | POA: Diagnosis not present

## 2018-03-27 LAB — GLUCOSE, CAPILLARY
Glucose-Capillary: 58 mg/dL — ABNORMAL LOW (ref 70–99)
Glucose-Capillary: 70 mg/dL (ref 70–99)
Glucose-Capillary: 165 mg/dL — ABNORMAL HIGH (ref 70–99)
Glucose-Capillary: 96 mg/dL (ref 70–99)

## 2018-03-27 MED FILL — Thrombin (Recombinant) For Soln 20000 Unit: CUTANEOUS | Qty: 1 | Status: AC

## 2018-03-27 NOTE — Progress Notes (Signed)
    Patient doing well  Patient reports resolved left arm pain Tolerating PO well   Physical Exam: Vitals:   03/26/18 2318 03/27/18 0307  BP: 131/74 130/72  Pulse: 77 80  Resp: 20 20  Temp: 98.4 F (36.9 C) 97.9 F (36.6 C)  SpO2: 96% 97%    Neck soft/supple Dressing in place NVI  POD #1 s/p C5/6 ACDF, doing well  - encourage ambulation - Percocet for pain, Valium for muscle spasms - d/c home today with f/u in 2 weeks

## 2018-03-27 NOTE — Anesthesia Postprocedure Evaluation (Signed)
Anesthesia Post Note  Patient: Juan [name]  Procedure(s) Performed: ANTERIOR CERVICAL DECOMPRESSION FUSION CERVICAL 5-6 WITH INSTRUMENTATION AND ALLOGRAFT (N/A )     Patient location during evaluation: PACU Anesthesia Type: General Level of consciousness: awake and alert Pain management: pain level controlled Vital Signs Assessment: post-procedure vital signs reviewed and stable Respiratory status: spontaneous breathing, nonlabored ventilation and respiratory function stable Cardiovascular status: blood pressure returned to baseline and stable Postop Assessment: no apparent nausea or vomiting Anesthetic complications: no    Last Vitals:  Vitals:   03/27/18 0307 03/27/18 0751  BP: 130/72 (!) 149/80  Pulse: 80 79  Resp: 20 18  Temp: 36.6 C 36.4 C  SpO2: 97% 99%    Last Pain:  Vitals:   03/27/18 0751  TempSrc: Oral  PainSc:                  Audry Pili

## 2018-03-27 NOTE — Progress Notes (Signed)
Hypoglycemic Event  CBG:58  Treatment:orange juice 8 oz juice/soda  Symptoms: None  Follow-up CBG: Time:0226 CBG Result:70  Possible Reasons for Event: Change in activity  Comments/MD :Dr Lambert Keto, Donia Guiles

## 2018-03-27 NOTE — Progress Notes (Signed)
Patient is discharged from room 3C05 at this time. Alert and in stable condition. IV site d/c'd and instructions read to patient and spouse with understanding verbalized. Left unit via wheelchair with all belongings at side. 

## 2018-03-28 LAB — TYPE AND SCREEN
ABO/RH(D): AB NEG
Antibody Screen: POSITIVE
DAT, IgG: POSITIVE

## 2018-04-03 NOTE — Discharge Summary (Signed)
Patient ID: Juan [name] MRN: [id number] DOB/AGE: [id number] y.o.  Admit date: 03/26/2018 Discharge date: 03/27/2018  Admission Diagnoses:  Active Problems:   Radiculopathy   Discharge Diagnoses:  Same  Past Medical History:  Diagnosis Date  . Abnormal cardiovascular stress test 02/06/2013  . Anemia    occasional - no problems currently(10/02/2011)  . Bilateral renal cysts 06/16/2011   states no known problems  . Blood transfusion without reported diagnosis   . CAD (coronary artery disease), LAD 90%, 1st diag 95%, LCX 70% wth AV groove 90%, RCA 40-50% mid and 80% long distal stenosis 02/03/13 02/04/2013  . Chest pain    positive Myoview stress test  . Coronary artery calcification seen on CAT scan   . Diabetes mellitus    IDDM  . Fatty liver disease, nonalcoholic 05/30/9700  . Hyperlipidemia   . Hypertension    states no dx. of HTN, takes med. to protect kidneys due to DM  . Lateral meniscus tear 09/2011   left  . Loose body in knee 09/2011   loose bodies left knee  . Non Hodgkin's lymphoma ([place]) 1991  . Pancreatitis    occasional - last episode 06/2011  . S/P CABG x 4, 02/05/13 LIMA-LAD; LT. RADIAL-OM;VG-DIAG; VG-PDA 02/06/2013   10/18 3/4 patent grafts (occluded SVG-->Diag), PCI/DESx 3 SVG-->RCA, normal EF  . Splenomegaly, congestive, chronic   . Stuffy and runny nose 10/02/2011   yellow drainage from nose    Surgeries: Procedure(s): ANTERIOR CERVICAL DECOMPRESSION FUSION CERVICAL 5-6 WITH INSTRUMENTATION AND ALLOGRAFT on 03/26/2018    Consultants: None  Discharged Condition: Improved  Hospital Course: AYUSH BOULET is an 53 y.o. male who was admitted 03/26/2018 for operative treatment of radiculopathy. Patient has severe unremitting pain that affects sleep, daily activities, and work/hobbies. After pre-op clearance the patient was taken to the operating room on 03/26/2018 and underwent  Procedure(s): ANTERIOR CERVICAL DECOMPRESSION FUSION CERVICAL 5-6 [place].    Patient was given perioperative antibiotics:  Anti-infectives (From admission, onward)   Start     Dose/Rate Route Frequency Ordered Stop   03/26/18 2100  ceFAZolin (ANCEF) IVPB 2g/100 mL premix     2 g 200 mL/hr over 30 Minutes Intravenous Every 8 hours 03/26/18 1654 03/27/18 0748   03/26/18 1300  ceFAZolin (ANCEF) IVPB 2g/100 mL premix     2 g 200 mL/hr over 30 Minutes Intravenous On call to O.R. 03/26/18 0949 03/26/18 1235   03/26/18 1022  ceFAZolin (ANCEF) 2-4 GM/100ML-% IVPB    Note to Pharmacy:  Ernesta Amble   : cabinet override      03/26/18 1022 03/26/18 1235       Patient was given sequential compression devices, early ambulation to prevent DVT.  Patient benefited maximally from hospital stay and there were no complications.    Recent vital signs: BP (!) 149/80 (BP Location: Right Arm)   Pulse 79   Temp 97.6 F (36.4 C) (Oral)   Resp 18   Ht 5\' 11"  (1.803 m)   Wt 104.8 kg   SpO2 99%   BMI 32.22 kg/m    Discharge Medications:   Allergies as of 03/27/2018      Reactions   Reglan [metoclopramide] Other (See Comments)   Only can tolerate in low doses, in higher doses it has the opposite effect      Medication List    TAKE these medications   aspirin 81 MG chewable tablet Chew 1 tablet (81 mg total)  by mouth daily.   clopidogrel 75 MG tablet Commonly known as:  PLAVIX Take 1 tablet (75 mg total) by mouth daily with breakfast.   FARXIGA 5 MG Tabs tablet Generic drug:  dapagliflozin propanediol Take 5 mg by mouth daily.   fenofibrate 160 MG tablet Take 160 mg by mouth daily.   FREESTYLE LIBRE 14 DAY READER Devi daily. as directed   FREESTYLE LIBRE 14 DAY SENSOR Misc USE AS DIRECTED EVERY 14 DAYS   furosemide 40 MG tablet Commonly known as:  LASIX Take 1 tablet (40 mg total) by mouth daily.   HUMULIN R 500 UNIT/ML injection Generic drug:  insulin regular human CONCENTRATED Inject into the skin continuous. Using  insulin pump   Icosapent Ethyl 1 g Caps Take 2 capsules (2 g total) by mouth 2 (two) times daily.   insulin pump Soln Inject into the skin.   isosorbide mononitrate 30 MG 24 hr tablet Commonly known as:  IMDUR Take 0.5 tablets (15 mg total) by mouth daily. What changed:    when to take this  reasons to take this   metFORMIN 1000 MG tablet Commonly known as:  GLUCOPHAGE Take 1,000 mg by mouth 2 (two) times daily with a meal.   metoprolol succinate 50 MG 24 hr tablet Commonly known as:  TOPROL-XL TAKE 1 TABLET EVERY DAY WITH OR IMMEDIATELY What changed:  See the new instructions.   nitroGLYCERIN 0.4 MG SL tablet Commonly known as:  NITROSTAT Place 1 tablet (0.4 mg total) under the tongue every 5 (five) minutes as needed.   ramipril 5 MG capsule Commonly known as:  ALTACE Take 5 mg by mouth daily.   simvastatin 10 MG tablet Commonly known as:  ZOCOR TAKE 1 TABLET EVERY DAY       Diagnostic Studies: Dg Cervical Spine 1 View  Result Date: 03/26/2018 CLINICAL DATA:  C5-6 disc herniation EXAM: DG CERVICAL SPINE - 1 VIEW; DG C-ARM 61-120 MIN COMPARISON:  11/08/2017 FLUOROSCOPY TIME:  Radiation Exposure Index (as provided by the fluoroscopic device): 2.64 mGy If the device does not provide the exposure index: Fluoroscopy Time:  13 seconds Number of Acquired Images:  1 FINDINGS: Interbody fusion at C5-6 with anterior fixation is noted. IMPRESSION: C5-6 fusion. Electronically Signed   By: Inez Catalina M.D.   On: 03/26/2018 14:25   Dg C-arm 1-60 Min  Result Date: 03/26/2018 CLINICAL DATA:  C5-6 disc herniation EXAM: DG CERVICAL SPINE - 1 VIEW; DG C-ARM 61-120 MIN COMPARISON:  11/08/2017 FLUOROSCOPY TIME:  Radiation Exposure Index (as provided by the fluoroscopic device): 2.64 mGy If the device does not provide the exposure index: Fluoroscopy Time:  13 seconds Number of Acquired Images:  1 FINDINGS: Interbody fusion at C5-6 with anterior fixation is noted. IMPRESSION: C5-6 fusion.  Electronically Signed   By: Inez Catalina M.D.   On: 03/26/2018 14:25    Disposition:    POD #1 s/p C5/6 ACDF, doing well  - encourage ambulation - Percocet for pain, Valium for muscle spasms -Written scripts for pain signed and in chart -D/C instructions sheet printed and in chart -D/C today  -F/U in office 2 weeks   Signed: Lennie Muckle Shahil Speegle 04/03/2018, 10:30 AM

## 2018-04-04 ENCOUNTER — Encounter (INDEPENDENT_AMBULATORY_CARE_PROVIDER_SITE_OTHER): Payer: Commercial Managed Care - PPO | Admitting: Ophthalmology

## 2018-04-15 ENCOUNTER — Encounter (INDEPENDENT_AMBULATORY_CARE_PROVIDER_SITE_OTHER): Payer: Commercial Managed Care - PPO | Admitting: Ophthalmology

## 2018-04-26 ENCOUNTER — Other Ambulatory Visit: Payer: Self-pay | Admitting: Student

## 2018-04-30 ENCOUNTER — Encounter (INDEPENDENT_AMBULATORY_CARE_PROVIDER_SITE_OTHER): Payer: Commercial Managed Care - PPO | Admitting: Ophthalmology

## 2018-05-09 DIAGNOSIS — M4802 Spinal stenosis, cervical region: Secondary | ICD-10-CM | POA: Diagnosis not present

## 2018-05-15 NOTE — Progress Notes (Signed)
[facility] Note  05/16/2018     CHIEF COMPLAINT Patient presents for Retina Follow Up   HISTORY OF PRESENT ILLNESS: Juan [name] is a 53 y.o. male who presents to the clinic today for:   HPI    Retina Follow Up    Patient presents with  Diabetic Retinopathy.  In both eyes.  This started 4 months ago.  Severity is moderate.  Duration of 4 months.  Since onset it is stable.  I, the attending physician,  performed the HPI with the patient and updated documentation appropriately.          Comments    Patient here for 4 months follow up for NPDR OU. Patient states vision doing alright. Eye waters a lot. No eye pain. Added  Vescpa 2 pills 2 times a day.        Last edited by Bernarda Caffey, MD on 05/16/2018 10:06 AM. (History)     pt states his vision is stable from last exam and he is not having any problems, pt states he is not aware of any BP spikes, but he does not check it at home  Referring physician: [name], NP [place]. [address] [place], [place] [address]  HISTORICAL INFORMATION:   Selected notes from the MEDICAL RECORD NUMBER Referred by Dr. [name] for DM exam LEE-  Ocular Hx-  PMH- type II DM, HTN, hyperlipidemia, hypertriglyceridemia, anemia, non hodgekins lymphoma, sleep apnea; last A1C 8.0    CURRENT MEDICATIONS: No current outpatient medications on file. (Ophthalmic Drugs)   No current facility-administered medications for this visit.  (Ophthalmic Drugs)   Current Outpatient Medications (Other)  Medication Sig  . aspirin 81 MG chewable tablet Chew 1 tablet (81 mg total) by mouth daily.  . clopidogrel (PLAVIX) 75 MG tablet TAKE 1 TABLET (75 MG TOTAL) BY MOUTH DAILY WITH BREAKFAST.  Marland Kitchen Continuous Blood Gluc Receiver (FREESTYLE LIBRE 14 DAY READER) DEVI daily. as directed  . Continuous Blood Gluc Sensor (FREESTYLE LIBRE 14 DAY SENSOR) MISC USE AS DIRECTED EVERY 14 DAYS  . FARXIGA 5 MG TABS tablet Take 5 mg by mouth  daily.  . fenofibrate 160 MG tablet Take 160 mg by mouth daily.   . furosemide (LASIX) 40 MG tablet Take 1 tablet (40 mg total) by mouth daily.  Vanessa Kick Ethyl 1 g CAPS Take 2 capsules (2 g total) by mouth 2 (two) times daily.  . Insulin Human (INSULIN PUMP) SOLN Inject into the skin.  Marland Kitchen insulin regular human CONCENTRATED (HUMULIN R) 500 UNIT/ML SOLN injection Inject into the skin continuous. Using insulin pump  . isosorbide mononitrate (IMDUR) 30 MG 24 hr tablet Take 0.5 tablets (15 mg total) by mouth daily. (Patient taking differently: Take 15 mg by mouth daily as needed (chest pain). )  . metFORMIN (GLUCOPHAGE) 1000 MG tablet Take 1,000 mg by mouth 2 (two) times daily with a meal.  . metoprolol succinate (TOPROL-XL) 50 MG 24 hr tablet TAKE 1 TABLET EVERY DAY WITH OR IMMEDIATELY (Patient taking differently: Take 50 mg by mouth daily. )  . nitroGLYCERIN (NITROSTAT) 0.4 MG SL tablet Place 1 tablet (0.4 mg total) under the tongue every 5 (five) minutes as needed.  . ramipril (ALTACE) 5 MG capsule Take 5 mg by mouth daily.  . simvastatin (ZOCOR) 10 MG tablet TAKE 1 TABLET EVERY DAY (Patient taking differently: Take 10 mg by mouth daily. )   No current facility-administered medications for this visit.  (  Other)      REVIEW OF SYSTEMS: ROS    Positive for: Genitourinary, Endocrine, Cardiovascular, Eyes   Negative for: Constitutional, Gastrointestinal, Neurological, Skin, Musculoskeletal, HENT, Respiratory, Psychiatric, Allergic/Imm, Heme/Lymph   Last edited by Theodore Demark on 05/16/2018  9:24 AM. (History)    pt states there has been no change in his vision, pt states [name] is his primary ophthalmologist   ALLERGIES Allergies  Allergen Reactions  . Reglan [Metoclopramide] Other (See Comments)    Only can tolerate in low doses, in higher doses it has the opposite effect    PAST MEDICAL HISTORY Past Medical History:  Diagnosis Date  . Abnormal cardiovascular stress test  02/06/2013  . Anemia    occasional - no problems currently(10/02/2011)  . Bilateral renal cysts 06/16/2011   states no known problems  . Blood transfusion without reported diagnosis   . CAD (coronary artery disease), LAD 90%, 1st diag 95%, LCX 70% wth AV groove 90%, RCA 40-50% mid and 80% long distal stenosis 02/03/13 02/04/2013  . Chest pain    positive Myoview stress test  . Coronary artery calcification seen on CAT scan   . Diabetes mellitus    IDDM  . Fatty liver disease, nonalcoholic 7/40/8144  . Hyperlipidemia   . Hypertension    states no dx. of HTN, takes med. to protect kidneys due to DM  . Lateral meniscus tear 09/2011   left  . Loose body in knee 09/2011   loose bodies left knee  . Non Hodgkin's lymphoma ([place]) 1991  . Pancreatitis    occasional - last episode 06/2011  . S/P CABG x 4, 02/05/13 LIMA-LAD; LT. RADIAL-OM;VG-DIAG; VG-PDA 02/06/2013   10/18 3/4 patent grafts (occluded SVG-->Diag), PCI/DESx 3 SVG-->RCA, normal EF  . Splenomegaly, congestive, chronic   . Stuffy and runny nose 10/02/2011   yellow drainage from nose   Past Surgical History:  Procedure Laterality Date  . ABDOMINAL AORTIC ANEURYSM REPAIR    . ANTERIOR CERVICAL DECOMP/DISCECTOMY FUSION N/A 03/26/2018   Procedure: ANTERIOR CERVICAL DECOMPRESSION FUSION CERVICAL 5-6 WITH INSTRUMENTATION AND ALLOGRAFT;  Surgeon: Phylliss Bob, MD;  Location: [facility];  Service: Orthopedics;  Laterality: N/A;  . CORONARY ARTERY BYPASS GRAFT N/A 02/05/2013   Procedure: CORONARY ARTERY BYPASS GRAFTING (CABG);  Surgeon: Ivin Poot, MD;  Location: [facility];  Service: Open Heart Surgery;  Laterality: N/A;  Coronary artery bypass graft on pump times four using left internal mammary artery and right greater saphenous vein via endovein harvest and left radial artery harvest.   . CORONARY STENT INTERVENTION  12/17/2016    PCI and drug-eluting stenting of the mid and distal RCA SVG   . CORONARY STENT INTERVENTION N/A 12/17/2016    Procedure: CORONARY STENT INTERVENTION;  Surgeon: Lorretta Harp, MD;  Location: [facility];  Service: Cardiovascular;  Laterality: N/A;  . ELBOW SURGERY    . HERNIA REPAIR     inguinal   . herniated disc    . INTRAOPERATIVE TRANSESOPHAGEAL ECHOCARDIOGRAM N/A 02/05/2013   Procedure: INTRAOPERATIVE TRANSESOPHAGEAL ECHOCARDIOGRAM;  Surgeon: Ivin Poot, MD;  Location: [facility];  Service: Open Heart Surgery;  Laterality: N/A;  . KNEE ARTHROSCOPY  10/09/2011   Procedure: ARTHROSCOPY KNEE;  Surgeon: Nita Sells, MD;  Location: [facility];  Service: Orthopedics;  Laterality: Left;  . LEFT HEART CATH AND CORS/GRAFTS ANGIOGRAPHY N/A 12/17/2016   Procedure: LEFT HEART CATH AND CORS/GRAFTS ANGIOGRAPHY;  Surgeon: Lorretta Harp, MD;  Location: [facility];  Service: Cardiovascular;  Laterality: N/A;  . LEFT HEART CATHETERIZATION WITH CORONARY ANGIOGRAM N/A 02/03/2013   Procedure: LEFT HEART CATHETERIZATION WITH CORONARY ANGIOGRAM;  Surgeon: Lorretta Harp, MD;  Location: [facility];  Service: Cardiovascular;  Laterality: N/A;  . NASAL SEPTUM SURGERY    . RADIAL ARTERY HARVEST Left 02/05/2013   Procedure: RADIAL ARTERY HARVEST;  Surgeon: Ivin Poot, MD;  Location: [facility];  Service: Vascular;  Laterality: Left;  . TIBIA BONE BIOPSY  x 3   left  . TONSILLECTOMY      FAMILY HISTORY Family History  Adopted: Yes    SOCIAL HISTORY Social History   Tobacco Use  . Smoking status: Never Smoker  . Smokeless tobacco: Never Used  Substance Use Topics  . Alcohol use: No  . Drug use: No         OPHTHALMIC EXAM:  Base Eye Exam    Visual Acuity (Snellen - Linear)      Right Left   Dist cc 20/20 20/20       Tonometry (Tonopen, 9:21 AM)      Right Left   Pressure 14 9       Pupils      Dark Light Shape React APD   Right 4 2 Round Brisk None   Left 4 2 Round Brisk None       Visual Fields (Counting fingers)      Left Right    Full  Full       Extraocular Movement      Right Left    Full, Ortho Full, Ortho       Neuro/Psych    Oriented x3:  Yes   Mood/Affect:  Normal       Dilation    Both eyes:  1.0% Mydriacyl, 2.5% Phenylephrine @ 9:21 AM        Slit Lamp and Fundus Exam    Slit Lamp Exam      Right Left   Lids/Lashes Normal Normal   Conjunctiva/Sclera White and quiet White and quiet   Cornea Inferior-temporal K scar, 1-2+ Punctate epithelial erosions, mild Arcus 2+ inferior Punctate epithelial erosions, mild Arcus, scattered peripheral areas of corneal haze / mild scarring/Salzmann's nodules   Anterior Chamber Deep and quiet Deep and quiet   Iris Round and dilated, No NVI Round and dilated, No NVI   Lens 1-2+ Nuclear sclerosis, 1-2+ Cortical cataract 1-2+ Nuclear sclerosis, 2+ Cortical cataract   Vitreous Vitreous syneresis Vitreous syneresis       Fundus Exam      Right Left   Disc sharp, No NVD, Pink and Sharp Normal, No NVD   C/D Ratio 0.2 0.4   Macula Good foveal reflex, few Microaneurysms, mild Retinal pigment epithelial mottling, macroaneurysms along arcades, focal collection of exudate along IT temporal arcades Good foveal reflex, few Microaneurysms, focal patches of exudate superior macula, no edema   Vessels Mild Copper wiring, Tortuous Mild Copper wiring, Vascular attenuation   Periphery Attached, scattered punctate RPE changes, focal area of exudation just above superior arcade -- improved scattered small patches of IRH and exudates Attached, focal area of exudation off the 0800 nerve with central hemorrhage, focal hemorrhage/exudation at 1130 1.5 DD from disc -- persistent, new heme; scattered focal areas of IRH/exudation -- minimal, interval improvement of exudate, new IRH        Refraction    Wearing Rx      Sphere Cylinder Axis Add   Right -4.00  Sphere  +1.25   Left -3.75 +0.50 060 +1.25          IMAGING AND PROCEDURES  Imaging and Procedures for 05/23/17  OCT, Retina - OU -  Both Eyes       Right Eye Quality was good. Central Foveal Thickness: 289. Progression has been stable. Findings include normal foveal contour, no IRF, no SRF.   Left Eye Quality was good. Central Foveal Thickness: 298. Progression has been stable. Findings include normal foveal contour, no IRF, no SRF, vitreomacular adhesion , intraretinal hyper-reflective material (Focal areas of IRHM nasal to disc).   Notes *Images captured and stored on drive  Diagnosis / Impression:  NFP, No IRF/SRF VMA, focal area of IRHM OS -- sup and inf nasal to disc  Clinical management:  See below  Abbreviations: NFP - Normal foveal profile. CME - cystoid macular edema. PED - pigment epithelial detachment. IRF - intraretinal fluid. SRF - subretinal fluid. EZ - ellipsoid zone. ERM - epiretinal membrane. ORA - outer retinal atrophy. ORT - outer retinal tubulation. SRHM - subretinal hyper-reflective material         Color Fundus Photography Optos - OU - Both Eyes       Right Eye Progression has no prior data. Disc findings include normal observations. Macula : exudates, normal observations (Tr exudates). Vessels : tortuous vessels, attenuated. Periphery : exudates, hemorrhage (Mild scattered IRH and exudates).   Left Eye Progression has no prior data. Disc findings include normal observations. Macula : exudates. Vessels : attenuated, tortuous vessels (+copper wiring). Periphery : exudates, hemorrhage.   Notes **Images stored on drive**  Impression: Focal patches of IRH and exudate OU (OS >>> OD)                  ASSESSMENT/PLAN:    ICD-10-CM   1. Diabetic retinal microaneurysm (HCC) E11.319 Color Fundus Photography Optos - OU - Both Eyes   H35.049   2. Moderate nonproliferative diabetic retinopathy of both eyes without macular edema associated with type 2 diabetes mellitus (HCC) I34.7425 Color Fundus Photography Optos - OU - Both Eyes  3. Essential hypertension I10   4. Hypertensive  retinopathy of both eyes H35.033   5. Retinal edema H35.81 OCT, Retina - OU - Both Eyes  6. Combined forms of age-related cataract of both eyes H25.813     1,2. DMII w/ nonproliferative diabetic retinopathy and noncentral diabetic retinal edema, both eyes - exam shows focal area of hemorrhage and exudates OU -- prior exams with progressively worsening focal hemorrhage and exudates - FA shows non central, focal areas of late leaking MA with surrounding capillary nonperfusion - OCT without central diabetic macular edema, both eyes, but focal areas of retinal edema / thickening corresponding to peripheral / noncentral areas of hemorrhage and exudation - pt reports history of CAD with stents placed Oct 2018, currently on plavix for 1 year -- possibly contributing to pathology. - S/P focal laser OS (09.16.19) - good initial response but now lesions have new hemorrhage - BCVA remains 20/20 - f/u in 3-4 months with wide field OCT and color fundus photos - will send letter to Donnella Bi, cardiologist, per pt request  3,4. Hypertensive retinopathy OU - discussed importance of tight BP control - BP in clinic 138/86 - BP likely contributing to retinal pathology above - monitor  5. Peripheral retinal edema noted on exam and OCT as described above  6. Combined form age-related cataract OU-  - The symptoms of cataract, surgical options,  and treatments and risks were discussed with patient. - discussed diagnosis and progression - not yet visually significant - monitor for now   Ophthalmic Meds Ordered this visit:  No orders of the defined types were placed in this encounter.      Return for f/u 3-4 months NPDR OU, DFE, OCT, OPTOS colors.  There are no Patient Instructions on file for this visit.   Explained the diagnoses, plan, and follow up with the patient and they expressed understanding.  Patient expressed understanding of the importance of proper follow up care.   This document  serves as a record of services personally performed by Gardiner Sleeper, MD, PhD. It was created on their behalf by Ernest Mallick, OA, an ophthalmic assistant. The creation of this record is the provider's dictation and/or activities during the visit.    Electronically signed by: Ernest Mallick, OA  02.27.2020 11:26 AM     Gardiner Sleeper, M.D., Ph.D. Diseases & Surgery of the Retina and Vitreous [facility]  I have reviewed the above documentation for accuracy and completeness, and I agree with the above. Gardiner Sleeper, M.D., Ph.D. 05/16/18 11:27 AM     Abbreviations: M myopia (nearsighted); A astigmatism; H hyperopia (farsighted); P presbyopia; Mrx spectacle prescription;  CTL contact lenses; OD right eye; OS left eye; OU both eyes  XT exotropia; ET esotropia; PEK punctate epithelial keratitis; PEE punctate epithelial erosions; DES dry eye syndrome; MGD meibomian gland dysfunction; ATs artificial tears; PFAT's preservative free artificial tears; [place] nuclear sclerotic cataract; PSC posterior subcapsular cataract; ERM epi-retinal membrane; PVD posterior vitreous detachment; RD retinal detachment; DM diabetes mellitus; DR diabetic retinopathy; NPDR non-proliferative diabetic retinopathy; PDR proliferative diabetic retinopathy; CSME clinically significant macular edema; DME diabetic macular edema; dbh dot blot hemorrhages; CWS cotton wool spot; POAG primary open angle glaucoma; C/D cup-to-disc ratio; HVF humphrey visual field; GVF goldmann visual field; OCT optical coherence tomography; IOP intraocular pressure; BRVO Branch retinal vein occlusion; CRVO central retinal vein occlusion; CRAO central retinal artery occlusion; BRAO branch retinal artery occlusion; RT retinal tear; SB scleral buckle; PPV pars plana vitrectomy; VH Vitreous hemorrhage; PRP panretinal laser photocoagulation; IVK intravitreal kenalog; VMT vitreomacular traction; MH Macular hole;  NVD neovascularization of  the disc; NVE neovascularization elsewhere; AREDS age related eye disease study; ARMD age related macular degeneration; POAG primary open angle glaucoma; EBMD epithelial/anterior basement membrane dystrophy; ACIOL anterior chamber intraocular lens; IOL intraocular lens; PCIOL posterior chamber intraocular lens; Phaco/IOL phacoemulsification with intraocular lens placement; [place] photorefractive keratectomy; LASIK laser assisted in situ keratomileusis; HTN hypertension; DM diabetes mellitus; COPD chronic obstructive pulmonary disease

## 2018-05-16 ENCOUNTER — Ambulatory Visit (INDEPENDENT_AMBULATORY_CARE_PROVIDER_SITE_OTHER): Payer: Commercial Managed Care - PPO | Admitting: Ophthalmology

## 2018-05-16 ENCOUNTER — Encounter (INDEPENDENT_AMBULATORY_CARE_PROVIDER_SITE_OTHER): Payer: Self-pay | Admitting: Ophthalmology

## 2018-05-16 VITALS — BP 138/86 | HR 78

## 2018-05-16 DIAGNOSIS — I10 Essential (primary) hypertension: Secondary | ICD-10-CM

## 2018-05-16 DIAGNOSIS — H35033 Hypertensive retinopathy, bilateral: Secondary | ICD-10-CM

## 2018-05-16 DIAGNOSIS — H35049 Retinal micro-aneurysms, unspecified, unspecified eye: Secondary | ICD-10-CM | POA: Diagnosis not present

## 2018-05-16 DIAGNOSIS — H3581 Retinal edema: Secondary | ICD-10-CM

## 2018-05-16 DIAGNOSIS — E11319 Type 2 diabetes mellitus with unspecified diabetic retinopathy without macular edema: Secondary | ICD-10-CM

## 2018-05-16 DIAGNOSIS — E113393 Type 2 diabetes mellitus with moderate nonproliferative diabetic retinopathy without macular edema, bilateral: Secondary | ICD-10-CM | POA: Diagnosis not present

## 2018-05-16 DIAGNOSIS — H25813 Combined forms of age-related cataract, bilateral: Secondary | ICD-10-CM

## 2018-05-22 DIAGNOSIS — M4322 Fusion of spine, cervical region: Secondary | ICD-10-CM | POA: Diagnosis not present

## 2018-05-28 DIAGNOSIS — M4322 Fusion of spine, cervical region: Secondary | ICD-10-CM | POA: Diagnosis not present

## 2018-05-29 DIAGNOSIS — E785 Hyperlipidemia, unspecified: Secondary | ICD-10-CM | POA: Diagnosis not present

## 2018-05-29 DIAGNOSIS — E781 Pure hyperglyceridemia: Secondary | ICD-10-CM | POA: Diagnosis not present

## 2018-05-29 LAB — LIPID PANEL
CHOLESTEROL TOTAL: 515 mg/dL — AB (ref 100–199)
Chol/HDL Ratio: 171.7 ratio — ABNORMAL HIGH (ref 0.0–5.0)
HDL: 3 mg/dL — ABNORMAL LOW (ref >39)
TRIGLYCERIDES: 5975 mg/dL — AB (ref 0–149)

## 2018-05-29 LAB — LDL CHOLESTEROL, DIRECT: LDL Direct: 6 mg/dL (ref 0–99)

## 2018-06-02 ENCOUNTER — Other Ambulatory Visit: Payer: Self-pay

## 2018-06-02 ENCOUNTER — Ambulatory Visit: Payer: Commercial Managed Care - PPO | Admitting: Internal Medicine

## 2018-06-02 ENCOUNTER — Encounter: Payer: Self-pay | Admitting: Internal Medicine

## 2018-06-02 VITALS — BP 146/72 | HR 100 | Ht 70.5 in | Wt 230.4 lb

## 2018-06-02 DIAGNOSIS — E785 Hyperlipidemia, unspecified: Secondary | ICD-10-CM

## 2018-06-02 DIAGNOSIS — E783 Hyperchylomicronemia: Secondary | ICD-10-CM | POA: Diagnosis not present

## 2018-06-02 DIAGNOSIS — Z951 Presence of aortocoronary bypass graft: Secondary | ICD-10-CM | POA: Diagnosis not present

## 2018-06-02 DIAGNOSIS — I25810 Atherosclerosis of coronary artery bypass graft(s) without angina pectoris: Secondary | ICD-10-CM | POA: Diagnosis not present

## 2018-06-02 DIAGNOSIS — E782 Mixed hyperlipidemia: Secondary | ICD-10-CM | POA: Diagnosis not present

## 2018-06-02 DIAGNOSIS — Z794 Long term (current) use of insulin: Secondary | ICD-10-CM

## 2018-06-02 DIAGNOSIS — E119 Type 2 diabetes mellitus without complications: Secondary | ICD-10-CM | POA: Diagnosis not present

## 2018-06-02 DIAGNOSIS — E1165 Type 2 diabetes mellitus with hyperglycemia: Secondary | ICD-10-CM | POA: Diagnosis not present

## 2018-06-02 DIAGNOSIS — R1012 Left upper quadrant pain: Secondary | ICD-10-CM | POA: Diagnosis not present

## 2018-06-02 DIAGNOSIS — Z23 Encounter for immunization: Secondary | ICD-10-CM | POA: Diagnosis not present

## 2018-06-02 NOTE — Progress Notes (Signed)
LIPID CLINIC CONSULT NOTE  Chief Complaint:  Follow-up high triglycerides  Primary Care Physician: [name], NP  HPI:  Juan [name] is a 53 y.o. male who is being seen today for the evaluation of high triglycerides at the request of [name], NP.  Juan [name] is seen today for evaluation of elevated triglycerides.  He has been a patient of Dr. Lavetta Nielsen at [facility] for many years.  He was last seen there in 2018.  He has hyperchylomicronemia.  Triglycerides have been very high and most recently up to 4730.  He says at best his triglycerides have come down to 450, but this was after very strict low saturated fat and calorie diet.  He says he has not been able to maintain that.  He also has diabetes and his blood sugars have not been optimally controlled.  His most recent A1c in September 2019 was 9.6.  He sees [name].  He has been on fenofibrate 160 mg.  In addition he takes low-dose simvastatin 10 mg.  He has a history of steatohepatitis and elevated liver enzymes in the past however most recently his ALT was 62 in September.  He also has clinical coronary disease having been found to have multivessel coronary disease in 2014 and subsequently he underwent coronary artery bypass grafting and has had multivessel PCI.  He was previously considered for niacin therapy however never really took that because of not being able to achieve good glycemic control.  He is not previously been on any omega-3's and had been considered for clinical trials but was not enrolled.  06/02/2018  Juan [name] returns today for follow-up of elevated triglycerides.  Fortunately, he has had a significant increase in his triglycerides since we last saw him.  His most recent labs showed total cholesterol of 515 with triglycerides of 5975.  HDL was 3 and LDL was 6 indicating persistent familial chylomicronemia syndrome.  He does say that his diet has significantly altered recently due to increased demands at work as  well as increased demands on his wife who works in a school [employer].  Although schools are now close due to the COVID-19 virus, she is now working extended hours to produce foods for children who are not at school.  He does report compliance with his medications.  PMHx:  Past Medical History:  Diagnosis Date  . Abnormal cardiovascular stress test 02/06/2013  . Anemia    occasional - no problems currently(10/02/2011)  . Bilateral renal cysts 06/16/2011   states no known problems  . Blood transfusion without reported diagnosis   . CAD (coronary artery disease), LAD 90%, 1st diag 95%, LCX 70% wth AV groove 90%, RCA 40-50% mid and 80% long distal stenosis 02/03/13 02/04/2013  . Chest pain    positive Myoview stress test  . Coronary artery calcification seen on CAT scan   . Diabetes mellitus    IDDM  . Fatty liver disease, nonalcoholic 3/90/3009  . Hyperlipidemia   . Hypertension    states no dx. of HTN, takes med. to protect kidneys due to DM  . Lateral meniscus tear 09/2011   left  . Loose body in knee 09/2011   loose bodies left knee  . Non Hodgkin's lymphoma ([place]) 1991  . Pancreatitis    occasional - last episode 06/2011  . S/P CABG x 4, 02/05/13 LIMA-LAD; LT. RADIAL-OM;VG-DIAG; VG-PDA 02/06/2013   10/18 3/4 patent grafts (occluded SVG-->Diag), PCI/DESx 3 SVG-->RCA, normal EF  . Splenomegaly, congestive,  chronic   . Stuffy and runny nose 10/02/2011   yellow drainage from nose    Past Surgical History:  Procedure Laterality Date  . ABDOMINAL AORTIC ANEURYSM REPAIR    . ANTERIOR CERVICAL DECOMP/DISCECTOMY FUSION N/A 03/26/2018   Procedure: ANTERIOR CERVICAL DECOMPRESSION FUSION CERVICAL 5-6 WITH INSTRUMENTATION AND ALLOGRAFT;  Surgeon: Phylliss Bob, MD;  Location: [place];  Service: Orthopedics;  Laterality: N/A;  . CORONARY ARTERY BYPASS GRAFT N/A 02/05/2013   Procedure: CORONARY ARTERY BYPASS GRAFTING (CABG);  Surgeon: Ivin Poot, MD;  Location: [place];  Service: Open Heart  Surgery;  Laterality: N/A;  Coronary artery bypass graft on pump times four using left internal mammary artery and right greater saphenous vein via endovein harvest and left radial artery harvest.   . CORONARY STENT INTERVENTION  12/17/2016    PCI and drug-eluting stenting of the mid and distal RCA SVG   . CORONARY STENT INTERVENTION N/A 12/17/2016   Procedure: CORONARY STENT INTERVENTION;  Surgeon: Lorretta Harp, MD;  Location: [place] [facility];  Service: Cardiovascular;  Laterality: N/A;  . ELBOW SURGERY    . HERNIA REPAIR     inguinal   . herniated disc    . INTRAOPERATIVE TRANSESOPHAGEAL ECHOCARDIOGRAM N/A 02/05/2013   Procedure: INTRAOPERATIVE TRANSESOPHAGEAL ECHOCARDIOGRAM;  Surgeon: Ivin Poot, MD;  Location: [place];  Service: Open Heart Surgery;  Laterality: N/A;  . KNEE ARTHROSCOPY  10/09/2011   Procedure: ARTHROSCOPY KNEE;  Surgeon: Nita Sells, MD;  Location: [place];  Service: Orthopedics;  Laterality: Left;  . LEFT HEART CATH AND CORS/GRAFTS ANGIOGRAPHY N/A 12/17/2016   Procedure: LEFT HEART CATH AND CORS/GRAFTS ANGIOGRAPHY;  Surgeon: Lorretta Harp, MD;  Location: [place] [facility];  Service: Cardiovascular;  Laterality: N/A;  . LEFT HEART CATHETERIZATION WITH CORONARY ANGIOGRAM N/A 02/03/2013   Procedure: LEFT HEART CATHETERIZATION WITH CORONARY ANGIOGRAM;  Surgeon: Lorretta Harp, MD;  [facility];  Service: Cardiovascular;  Laterality: N/A;  . NASAL SEPTUM SURGERY    . RADIAL ARTERY HARVEST Left 02/05/2013   Procedure: RADIAL ARTERY HARVEST;  Surgeon: Ivin Poot, MD;  Location: [place];  Service: Vascular;  Laterality: Left;  . TIBIA BONE BIOPSY  x 3   left  . TONSILLECTOMY      FAMHx:  Family History  Adopted: Yes    SOCHx:   reports that he has never smoked. He has never used smokeless tobacco. He reports that he does not drink alcohol or use drugs.  ALLERGIES:  Allergies  Allergen Reactions  . Reglan  [Metoclopramide] Other (See Comments)    Only can tolerate in low doses, in higher doses it has the opposite effect    ROS: Pertinent items noted in HPI and remainder of comprehensive ROS otherwise negative.  HOME MEDS: Current Outpatient Medications on File Prior to Visit  Medication Sig Dispense Refill  . aspirin 81 MG chewable tablet Chew 1 tablet (81 mg total) by mouth daily.    . clopidogrel (PLAVIX) 75 MG tablet TAKE 1 TABLET (75 MG TOTAL) BY MOUTH DAILY WITH BREAKFAST. 90 tablet 3  . Continuous Blood Gluc Receiver (FREESTYLE LIBRE 14 DAY READER) DEVI daily. as directed  6  . Continuous Blood Gluc Sensor (FREESTYLE LIBRE 14 DAY SENSOR) MISC USE AS DIRECTED EVERY 14 DAYS  6  . FARXIGA 5 MG TABS tablet Take 5 mg by mouth daily.  11  . fenofibrate 160 MG tablet Take 160 mg by mouth daily.     Marland Kitchen  furosemide (LASIX) 40 MG tablet Take 1 tablet (40 mg total) by mouth daily. 90 tablet 0  . Icosapent Ethyl 1 g CAPS Take 2 capsules (2 g total) by mouth 2 (two) times daily. 120 capsule 11  . Insulin Human (INSULIN PUMP) SOLN Inject into the skin.    Marland Kitchen insulin regular human CONCENTRATED (HUMULIN R) 500 UNIT/ML SOLN injection Inject into the skin continuous. Using insulin pump    . isosorbide mononitrate (IMDUR) 30 MG 24 hr tablet Take 0.5 tablets (15 mg total) by mouth daily. (Patient taking differently: Take 15 mg by mouth daily as needed (chest pain). ) 45 tablet 3  . metFORMIN (GLUCOPHAGE) 1000 MG tablet Take 1,000 mg by mouth 2 (two) times daily with a meal.    . metoprolol succinate (TOPROL-XL) 50 MG 24 hr tablet TAKE 1 TABLET EVERY DAY WITH OR IMMEDIATELY (Patient taking differently: Take 50 mg by mouth daily. ) 90 tablet 2  . nitroGLYCERIN (NITROSTAT) 0.4 MG SL tablet Place 1 tablet (0.4 mg total) under the tongue every 5 (five) minutes as needed. 25 tablet 2  . ramipril (ALTACE) 5 MG capsule Take 5 mg by mouth daily.  4  . simvastatin (ZOCOR) 10 MG tablet TAKE 1 TABLET EVERY DAY (Patient  taking differently: Take 10 mg by mouth daily. ) 90 tablet 2   No current facility-administered medications on file prior to visit.     LABS/IMAGING: No results found for this or any previous visit (from the past 48 hour(s)). No results found.  LIPID PANEL:    Component Value Date/Time   CHOL 515 (H) 05/29/2018 1123   TRIG 5,975 (HH) 05/29/2018 1123   HDL 3 (L) 05/29/2018 1123   CHOLHDL 171.7 (H) 05/29/2018 1123   CHOLHDL 14.2 06/17/2011 0400   VLDL UNABLE TO CALCULATE IF TRIGLYCERIDE OVER 400 mg/dL 06/17/2011 0400   LDLCALC Comment 05/29/2018 1123   LDLDIRECT 6 05/29/2018 1123    WEIGHTS: Wt Readings from Last 3 Encounters:  06/02/18 230 lb 6.4 oz (104.5 kg)  03/26/18 231 lb (104.8 kg)  03/24/18 231 lb (104.8 kg)    VITALS: BP (!) 146/72   Pulse 100   Ht 5' 10.5" (1.791 m)   Wt 230 lb 6.4 oz (104.5 kg)   BMI 32.59 kg/m   EXAM: General appearance: alert and no distress Neck: no carotid bruit, no JVD and thyroid not enlarged, symmetric, no tenderness/mass/nodules Lungs: clear to auscultation bilaterally Heart: regular rate and rhythm, S1, S2 normal, no murmur, click, rub or gallop Abdomen: soft, non-tender; bowel sounds normal; no masses,  no organomegaly Extremities: multiple cholesterol xanthomas on the elbows, knees and feet Pulses: 2+ and symmetric Skin: multiple xanthomas Neurologic: Grossly normal Psych: Pleasant  EKG: Deferred  ASSESSMENT: 1. Hyperchylomicronemia syndrome 2. ASCVD with prior four-vessel CABG and PCI 3. Insulin-dependent diabetes 4. Hypertension  PLAN: 1.   Mr. Hardenbrook had much better control over his triglycerides several months ago but recently has had significant dietary changes, decreased activity and stresses which have contributed to a significant increase in his triglycerides.  His hemoglobin A1c is also not adequately controlled and will need to be further adjusted.  He reports compliance with his medications.  At this point will  need to work on much stricter diet. We discussed several medications which are in development - including Volenosorsen (which was rejected by the FDA in 01/2018) - I will notify him if new medications become available.  Follow-up with me in 6 months.  Nadean Corwin  Debara Pickett, MD, [facility], [place] Director of the Advanced Lipid Disorders &  Cardiovascular Risk Reduction Clinic Diplomate of the American Board of Clinical Lipidology Attending Cardiologist  Direct Dial: [phone]  Fax: [phone]  Website:  [email or web address] Honor [email or web address] 06/02/2018, 8:43 AM

## 2018-06-02 NOTE — Patient Instructions (Signed)
Medication Instructions:  NOT NEEDED If you need a refill on your cardiac medications before your next appointment, please call your pharmacy.   Lab work: In 6 months -- LIPIDS, LDL-DIRECT - WILL MAIL YOU LABSLIP PRIOR TO YOUR APPOINTMENT If you have labs (blood work) drawn today and your tests are completely normal, you will receive your results only by: Marland Kitchen MyChart Message (if you have MyChart) OR . A paper copy in the mail If you have any lab test that is abnormal or we need to change your treatment, we will call you to review the results.  Testing/Procedures: NOT NEEDED  Follow-Up: At [facility], you and your health needs are our priority.  As part of our continuing mission to provide you with exceptional heart care, we have created designated Provider Care Teams.  These Care Teams include your primary Cardiologist (physician) and Advanced Practice Providers (APPs -  Physician Assistants and Nurse Practitioners) who all work together to provide you with the care you need, when you need it. . Your physician recommends that you schedule a follow-up appointment in [place] .   Any Other Special Instructions Will Be Listed Below (If Applicable).

## 2018-06-04 DIAGNOSIS — I10 Essential (primary) hypertension: Secondary | ICD-10-CM | POA: Diagnosis not present

## 2018-06-04 DIAGNOSIS — R809 Proteinuria, unspecified: Secondary | ICD-10-CM | POA: Diagnosis not present

## 2018-06-04 DIAGNOSIS — E782 Mixed hyperlipidemia: Secondary | ICD-10-CM | POA: Diagnosis not present

## 2018-06-04 DIAGNOSIS — E1165 Type 2 diabetes mellitus with hyperglycemia: Secondary | ICD-10-CM | POA: Diagnosis not present

## 2018-06-06 DIAGNOSIS — E871 Hypo-osmolality and hyponatremia: Secondary | ICD-10-CM | POA: Diagnosis not present

## 2018-06-06 DIAGNOSIS — M4322 Fusion of spine, cervical region: Secondary | ICD-10-CM | POA: Diagnosis not present

## 2018-06-09 DIAGNOSIS — M4322 Fusion of spine, cervical region: Secondary | ICD-10-CM | POA: Diagnosis not present

## 2018-06-12 ENCOUNTER — Telehealth: Payer: Self-pay | Admitting: *Deleted

## 2018-06-12 DIAGNOSIS — M4322 Fusion of spine, cervical region: Secondary | ICD-10-CM | POA: Diagnosis not present

## 2018-06-12 NOTE — Telephone Encounter (Signed)
   Cardiac Questionnaire:    Since your last visit or hospitalization:    1. Have you been having new or worsening chest pain? no   2. Have you been having new or worsening shortness of breath? no 3. Have you been having new or worsening leg swelling, wt gain, or increase in abdominal girth (pants fitting more tightly)? no   4. Have you had any passing out spells? no    *A YES to any of these questions would result in the appointment being kept. *If all the answers to these questions are NO, we should indicate that given the current situation regarding the worldwide coronarvirus pandemic, at the recommendation of the CDC, we are looking to limit gatherings in our waiting area, and thus will reschedule their appointment beyond four weeks from today.   _____________      Primary Cardiologist:  Quay Burow, MD   Patient contacted.  History reviewed.  No symptoms to suggest any unstable cardiac conditions.  Based on discussion, with current pandemic situation, we will be postponing this appointment for Juan [name] with a plan for f/u in 3 months or sooner if feasible/necessary.  If symptoms change, he has been instructed to contact our office.   Patient will call back to reschedule  Fredia Beets, RN  06/12/2018 2:10 PM         .

## 2018-06-13 ENCOUNTER — Ambulatory Visit: Payer: Commercial Managed Care - PPO | Admitting: Cardiovascular Disease

## 2018-07-15 ENCOUNTER — Other Ambulatory Visit: Payer: Self-pay | Admitting: Cardiovascular Disease

## 2018-07-15 NOTE — Telephone Encounter (Signed)
Metoprolol XL 50 MG refilled.

## 2018-08-05 ENCOUNTER — Other Ambulatory Visit: Payer: Self-pay | Admitting: Cardiovascular Disease

## 2018-08-12 ENCOUNTER — Ambulatory Visit: Payer: Commercial Managed Care - PPO | Admitting: Cardiovascular Disease

## 2018-08-14 NOTE — Progress Notes (Signed)
[facility] Note  08/15/2018     CHIEF COMPLAINT Patient presents for Diabetic Eye Exam and Retina Follow Up   HISTORY OF PRESENT ILLNESS: Juan [name] is a 53 y.o. male who presents to the clinic today for:   HPI    Diabetic Eye Exam    I, the attending physician,  performed the HPI with the patient and updated documentation appropriately.          Retina Follow Up    Patient presents with  Other.  In both eyes.  This started 9 months ago.  Severity is mild.  Since onset it is stable.  I, the attending physician,  performed the HPI with the patient and updated documentation appropriately.          Comments    F/U DRM OU. Patient states for the last month he has been having watery itchy eyes, left eye is tender in the outer corner per patient .Pt also states he is having light sensitivity when he turns on the lights after being in the dark   Denies ocular pain.BS 160 , BS has been fluctuating per patient . Pt is using lubricant eye gtt's prn       Last edited by Bernarda Caffey, MD on 08/15/2018  9:52 AM. (History)     pt states his eyes are watering and itching and he is a little light sensitive, pts cholesterol has been very high recently and he was given a new medication to get it back under control  Referring physician: [name], NP [address] [place], [place] [address]  HISTORICAL INFORMATION:   Selected notes from the MEDICAL RECORD NUMBER Referred by Dr. [name] for DM exam LEE-  Ocular Hx-  PMH- type II DM, HTN, hyperlipidemia, hypertriglyceridemia, anemia, non hodgekins lymphoma, sleep apnea; last A1C 8.0    CURRENT MEDICATIONS: No current outpatient medications on file. (Ophthalmic Drugs)   No current facility-administered medications for this visit.  (Ophthalmic Drugs)   Current Outpatient Medications (Other)  Medication Sig  . aspirin 81 MG chewable tablet Chew 1 tablet (81 mg total) by mouth daily.  .  clopidogrel (PLAVIX) 75 MG tablet TAKE 1 TABLET (75 MG TOTAL) BY MOUTH DAILY WITH BREAKFAST.  Marland Kitchen Continuous Blood Gluc Receiver (FREESTYLE LIBRE 14 DAY READER) DEVI daily. as directed  . Continuous Blood Gluc Sensor (FREESTYLE LIBRE 14 DAY SENSOR) MISC USE AS DIRECTED EVERY 14 DAYS  . FARXIGA 5 MG TABS tablet Take 5 mg by mouth daily.  . fenofibrate 160 MG tablet Take 160 mg by mouth daily.   . furosemide (LASIX) 40 MG tablet Take 1 tablet (40 mg total) by mouth daily.  Vanessa Kick Ethyl 1 g CAPS Take 2 capsules (2 g total) by mouth 2 (two) times daily.  . Insulin Human (INSULIN PUMP) SOLN Inject into the skin.  Marland Kitchen insulin regular human CONCENTRATED (HUMULIN R) 500 UNIT/ML SOLN injection Inject into the skin continuous. Using insulin pump  . metFORMIN (GLUCOPHAGE) 1000 MG tablet Take 1,000 mg by mouth 2 (two) times daily with a meal.  . metoprolol succinate (TOPROL-XL) 50 MG 24 hr tablet TAKE 1 TABLET EVERY DAY WITH OR IMMEDIATELY FOLLOWING A MEAL  . nitroGLYCERIN (NITROSTAT) 0.4 MG SL tablet Place 1 tablet (0.4 mg total) under the tongue every 5 (five) minutes as needed.  . ramipril (ALTACE) 5 MG capsule Take 5 mg by mouth daily.  . simvastatin (ZOCOR) 10 MG tablet  TAKE 1 TABLET BY MOUTH EVERY DAY  . isosorbide mononitrate (IMDUR) 30 MG 24 hr tablet Take 0.5 tablets (15 mg total) by mouth daily. (Patient taking differently: Take 15 mg by mouth daily as needed (chest pain). )   No current facility-administered medications for this visit.  (Other)      REVIEW OF SYSTEMS: ROS    Positive for: Genitourinary, Endocrine, Eyes   Negative for: Constitutional, Gastrointestinal, Neurological, Skin, Musculoskeletal, HENT, Cardiovascular, Respiratory, Psychiatric, Allergic/Imm, Heme/Lymph   Last edited by Zenovia Jordan, LPN on 6/57/8469  6:29 AM. (History)    pt states there has been no change in his vision, pt states [name] is his primary ophthalmologist   ALLERGIES Allergies  Allergen  Reactions  . Reglan [Metoclopramide] Other (See Comments)    Only can tolerate in low doses, in higher doses it has the opposite effect    PAST MEDICAL HISTORY Past Medical History:  Diagnosis Date  . Abnormal cardiovascular stress test 02/06/2013  . Anemia    occasional - no problems currently(10/02/2011)  . Bilateral renal cysts 06/16/2011   states no known problems  . Blood transfusion without reported diagnosis   . CAD (coronary artery disease), LAD 90%, 1st diag 95%, LCX 70% wth AV groove 90%, RCA 40-50% mid and 80% long distal stenosis 02/03/13 02/04/2013  . Chest pain    positive Myoview stress test  . Coronary artery calcification seen on CAT scan   . Diabetes mellitus    IDDM  . Fatty liver disease, nonalcoholic 08/14/4130  . Hyperlipidemia   . Hypertension    states no dx. of HTN, takes med. to protect kidneys due to DM  . Lateral meniscus tear 09/2011   left  . Loose body in knee 09/2011   loose bodies left knee  . Non Hodgkin's lymphoma ([place]) 1991  . Pancreatitis    occasional - last episode 06/2011  . S/P CABG x 4, 02/05/13 LIMA-LAD; LT. RADIAL-OM;VG-DIAG; VG-PDA 02/06/2013   10/18 3/4 patent grafts (occluded SVG-->Diag), PCI/DESx 3 SVG-->RCA, normal EF  . Splenomegaly, congestive, chronic   . Stuffy and runny nose 10/02/2011   yellow drainage from nose   Past Surgical History:  Procedure Laterality Date  . ABDOMINAL AORTIC ANEURYSM REPAIR    . ANTERIOR CERVICAL DECOMP/DISCECTOMY FUSION N/A 03/26/2018   Procedure: ANTERIOR CERVICAL DECOMPRESSION FUSION CERVICAL 5-6 WITH INSTRUMENTATION AND ALLOGRAFT;  Surgeon: Phylliss Bob, MD;  Location: [facility];  Service: Orthopedics;  Laterality: N/A;  . CORONARY ARTERY BYPASS GRAFT N/A 02/05/2013   Procedure: CORONARY ARTERY BYPASS GRAFTING (CABG);  Surgeon: Ivin Poot, MD;  Location: [facility];  Service: Open Heart Surgery;  Laterality: N/A;  Coronary artery bypass graft on pump times four using left internal mammary artery and  right greater saphenous vein via endovein harvest and left radial artery harvest.   . CORONARY STENT INTERVENTION  12/17/2016    PCI and drug-eluting stenting of the mid and distal RCA SVG   . CORONARY STENT INTERVENTION N/A 12/17/2016   Procedure: CORONARY STENT INTERVENTION;  Surgeon: Lorretta Harp, MD;  Location: [facility];  Service: Cardiovascular;  Laterality: N/A;  . ELBOW SURGERY    . HERNIA REPAIR     inguinal   . herniated disc    . INTRAOPERATIVE TRANSESOPHAGEAL ECHOCARDIOGRAM N/A 02/05/2013   Procedure: INTRAOPERATIVE TRANSESOPHAGEAL ECHOCARDIOGRAM;  Surgeon: Ivin Poot, MD;  Location: [facility];  Service: Open Heart Surgery;  Laterality: N/A;  . KNEE ARTHROSCOPY  10/09/2011  Procedure: ARTHROSCOPY KNEE;  Surgeon: Nita Sells, MD;  Location: [facility];  Service: Orthopedics;  Laterality: Left;  . LEFT HEART CATH AND CORS/GRAFTS ANGIOGRAPHY N/A 12/17/2016   Procedure: LEFT HEART CATH AND CORS/GRAFTS ANGIOGRAPHY;  Surgeon: Lorretta Harp, MD;  Location: [facility];  Service: Cardiovascular;  Laterality: N/A;  . LEFT HEART CATHETERIZATION WITH CORONARY ANGIOGRAM N/A 02/03/2013   Procedure: LEFT HEART CATHETERIZATION WITH CORONARY ANGIOGRAM;  Surgeon: Lorretta Harp, MD;  [facility];  Service: Cardiovascular;  Laterality: N/A;  . NASAL SEPTUM SURGERY    . RADIAL ARTERY HARVEST Left 02/05/2013   Procedure: RADIAL ARTERY HARVEST;  Surgeon: Ivin Poot, MD;  Location: [facility];  Service: Vascular;  Laterality: Left;  . TIBIA BONE BIOPSY  x 3   left  . TONSILLECTOMY      FAMILY HISTORY Family History  Adopted: Yes    SOCIAL HISTORY Social History   Tobacco Use  . Smoking status: Never Smoker  . Smokeless tobacco: Never Used  Substance Use Topics  . Alcohol use: No  . Drug use: No         OPHTHALMIC EXAM:  Base Eye Exam    Visual Acuity (Snellen - Linear)      Right Left   Dist cc 20/25 -1 20/25 -1    Dist ph cc 20/25 +2 20/20 -1   Correction:  Glasses       Tonometry (Tonopen, 9:16 AM)      Right Left   Pressure 12 13       Pupils      Dark Light Shape React APD   Right 4 3 Round Brisk None   Left 4 3 Round Brisk None       Visual Fields (Counting fingers)      Left Right    Full Full       Extraocular Movement      Right Left    Ortho Ortho    -- -- --  --  --  -- -- --   -- -- --  --  --  -- -- --         Neuro/Psych    Oriented x3:  Yes   Mood/Affect:  Normal       Dilation    Both eyes:  1.0% Mydriacyl, 2.5% Phenylephrine @ 9:12 AM        Slit Lamp and Fundus Exam    Slit Lamp Exam      Right Left   Lids/Lashes Normal Normal   Conjunctiva/Sclera White and quiet White and quiet   Cornea Inferior-temporal K scar, 1-2+ Punctate epithelial erosions, mild Arcus 2+ inferior Punctate epithelial erosions, mild Arcus, scattered peripheral areas of corneal haze / mild scarring/Salzmann's nodules   Anterior Chamber Deep and quiet Deep and quiet   Iris Round and dilated, No NVI Round and dilated, No NVI   Lens 1-2+ Nuclear sclerosis, 1-2+ Cortical cataract 1-2+ Nuclear sclerosis, 2+ Cortical cataract   Vitreous Vitreous syneresis Vitreous syneresis       Fundus Exam      Right Left   Disc Pink and Sharp, no NVD Normal, No NVD   C/D Ratio 0.3 0.3   Macula Good foveal reflex, few Microaneurysms, mild Retinal pigment epithelial mottling, macroaneurysms along arcades, focal collections of exudate along IT temporal arcades - improved Good foveal reflex, few Microaneurysms, focal patches of exudate superior macula, no edema   Vessels Interval worsening of  copper wiring, Tortuous, Vascular attenuation Interval worsening of copper wiring, Tortuous, Vascular attenuation   Periphery Attached, scattered punctate RPE changes, focal area of exudation just above SN arcade -- improved; scattered small patches of IRH and exudates Attached, focal area of exudation off the 0800  nerve with central hemorrhage, focal hemorrhage/exudation at 1130 1.5 DD from disc -- persistent, new heme; scattered focal areas of IRH/exudation -- minimal, interval improvement of exudate, new IRH          IMAGING AND PROCEDURES  Imaging and Procedures for 05/23/17  OCT, Retina - OU - Both Eyes       Right Eye Quality was good. Central Foveal Thickness: 287. Progression has been stable. Findings include normal foveal contour, no IRF, no SRF.   Left Eye Quality was good. Central Foveal Thickness: 293. Progression has been stable. Findings include normal foveal contour, no IRF, no SRF, vitreomacular adhesion , intraretinal hyper-reflective material (Focal areas of IRHM nasal to disc -- mild interval increase).   Notes *Images captured and stored on drive  Diagnosis / Impression:  NFP, No IRF/SRF VMA, focal area of IRHM OS -- sup and inf nasal to disc - mild interval increase  Clinical management:  See below  Abbreviations: NFP - Normal foveal profile. CME - cystoid macular edema. PED - pigment epithelial detachment. IRF - intraretinal fluid. SRF - subretinal fluid. EZ - ellipsoid zone. ERM - epiretinal membrane. ORA - outer retinal atrophy. ORT - outer retinal tubulation. SRHM - subretinal hyper-reflective material                  ASSESSMENT/PLAN:    ICD-10-CM   1. 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 Moderate nonproliferative diabetic retinopathy of both eyes without macular edema associated with type 2 diabetes mellitus ([place]) S28.3151   3. Essential hypertension I10   4. Hypertensive retinopathy of both eyes H35.033   5. Dyslipidemia E78.5   6. Retinal edema H35.81 OCT, Retina - OU - Both Eyes  7. Combined forms of age-related cataract of both eyes H25.813     1,2. DMII w/ nonproliferative diabetic retinopathy and noncentral diabetic retinal edema, both eyes  - h/o focal areas of hemorrhage and exudates OU -- prior exams with  progressively worsening focal hemorrhage and exudates  - exam today shows striking interval worsening of copper wiring of arterial vessels -- likely related to uncontrolled hypertriglyceridemia and hyperchylomicronemia  - prior FAs show non central, focal areas of late leaking MA with surrounding capillary nonperfusion  - OCT without central diabetic macular edema, both eyes, but focal areas of retinal edema / thickening corresponding to peripheral / noncentral areas of hemorrhage and exudation  - pt reports history of CAD with stents placed Oct 2018, currently on plavix for 1 year -- possibly contributing to pathology.  - S/P focal laser OS (09.16.19)  - good initial response but now lesions have persistent hemorrhage  - BCVA remains fairly stable-- 20/25+2 OD, 2020-1 OS  - f/u in 2-3 months with wide field OCT and color fundus photos  - will send letter to Donnella Bi, cardiologist, per pt request  3,4. Hypertensive retinopathy and dyslipidemia / hypertriglyceridemia OU  - discussed importance of tight BP control  - BP likely contributing to retinal vascular pathology above  - significant worsening of arterial vessel appearance OU -- increased copper wiring -- likely related to uncontrolled hypertriglyceridemia  - monitor for now  5. Peripheral retinal edema noted on exam and  OCT as described above  6. Combined form age-related cataract OU-   - The symptoms of cataract, surgical options, and treatments and risks were discussed with patient.  - discussed diagnosis and progression  - not yet visually significant  - monitor for now   Ophthalmic Meds Ordered this visit:  No orders of the defined types were placed in this encounter.      Return in about 3 months (around 11/15/2018) for f/u NPDR OU, DFE, OCT.  There are no Patient Instructions on file for this visit.   Explained the diagnoses, plan, and follow up with the patient and they expressed understanding.  Patient expressed  understanding of the importance of proper follow up care.   This document serves as a record of services personally performed by Gardiner Sleeper, MD, PhD. It was created on their behalf by Ernest Mallick, OA, an ophthalmic assistant. The creation of this record is the provider's dictation and/or activities during the visit.    Electronically signed by: Ernest Mallick, OA  05.28.2020 2:09 AM    Gardiner Sleeper, M.D., Ph.D. Diseases & Surgery of the Retina and Vitreous [facility]  I have reviewed the above documentation for accuracy and completeness, and I agree with the above. Gardiner Sleeper, M.D., Ph.D. 08/16/18 2:11 AM    Abbreviations: M myopia (nearsighted); A astigmatism; H hyperopia (farsighted); P presbyopia; Mrx spectacle prescription;  CTL contact lenses; OD right eye; OS left eye; OU both eyes  XT exotropia; ET esotropia; PEK punctate epithelial keratitis; PEE punctate epithelial erosions; DES dry eye syndrome; MGD meibomian gland dysfunction; ATs artificial tears; PFAT's preservative free artificial tears; Superior nuclear sclerotic cataract; PSC posterior subcapsular cataract; ERM epi-retinal membrane; PVD posterior vitreous detachment; RD retinal detachment; DM diabetes mellitus; DR diabetic retinopathy; NPDR non-proliferative diabetic retinopathy; PDR proliferative diabetic retinopathy; CSME clinically significant macular edema; DME diabetic macular edema; dbh dot blot hemorrhages; CWS cotton wool spot; POAG primary open angle glaucoma; C/D cup-to-disc ratio; HVF humphrey visual field; GVF goldmann visual field; OCT optical coherence tomography; IOP intraocular pressure; BRVO Branch retinal vein occlusion; CRVO central retinal vein occlusion; CRAO central retinal artery occlusion; BRAO branch retinal artery occlusion; RT retinal tear; SB scleral buckle; PPV pars plana vitrectomy; VH Vitreous hemorrhage; PRP panretinal laser photocoagulation; IVK intravitreal kenalog; VMT  vitreomacular traction; MH Macular hole;  NVD neovascularization of the disc; NVE neovascularization elsewhere; AREDS age related eye disease study; ARMD age related macular degeneration; POAG primary open angle glaucoma; EBMD epithelial/anterior basement membrane dystrophy; ACIOL anterior chamber intraocular lens; IOL intraocular lens; PCIOL posterior chamber intraocular lens; Phaco/IOL phacoemulsification with intraocular lens placement; O'Donnell photorefractive keratectomy; LASIK laser assisted in situ keratomileusis; HTN hypertension; DM diabetes mellitus; COPD chronic obstructive pulmonary disease

## 2018-08-15 ENCOUNTER — Ambulatory Visit (INDEPENDENT_AMBULATORY_CARE_PROVIDER_SITE_OTHER): Payer: Commercial Managed Care - PPO | Admitting: Ophthalmology

## 2018-08-15 ENCOUNTER — Other Ambulatory Visit: Payer: Self-pay

## 2018-08-15 ENCOUNTER — Encounter (INDEPENDENT_AMBULATORY_CARE_PROVIDER_SITE_OTHER): Payer: Self-pay | Admitting: Ophthalmology

## 2018-08-15 DIAGNOSIS — I10 Essential (primary) hypertension: Secondary | ICD-10-CM | POA: Diagnosis not present

## 2018-08-15 DIAGNOSIS — H35049 Retinal micro-aneurysms, unspecified, unspecified eye: Secondary | ICD-10-CM

## 2018-08-15 DIAGNOSIS — H25813 Combined forms of age-related cataract, bilateral: Secondary | ICD-10-CM

## 2018-08-15 DIAGNOSIS — H35033 Hypertensive retinopathy, bilateral: Secondary | ICD-10-CM | POA: Diagnosis not present

## 2018-08-15 DIAGNOSIS — E113393 Type 2 diabetes mellitus with moderate nonproliferative diabetic retinopathy without macular edema, bilateral: Secondary | ICD-10-CM | POA: Diagnosis not present

## 2018-08-15 DIAGNOSIS — E785 Hyperlipidemia, unspecified: Secondary | ICD-10-CM

## 2018-08-15 DIAGNOSIS — H3581 Retinal edema: Secondary | ICD-10-CM

## 2018-08-15 DIAGNOSIS — E11319 Type 2 diabetes mellitus with unspecified diabetic retinopathy without macular edema: Secondary | ICD-10-CM

## 2018-08-19 ENCOUNTER — Telehealth (INDEPENDENT_AMBULATORY_CARE_PROVIDER_SITE_OTHER): Payer: Commercial Managed Care - PPO | Admitting: Cardiovascular Disease

## 2018-08-19 ENCOUNTER — Telehealth: Payer: Self-pay

## 2018-08-19 DIAGNOSIS — I5189 Other ill-defined heart diseases: Secondary | ICD-10-CM | POA: Diagnosis not present

## 2018-08-19 DIAGNOSIS — E785 Hyperlipidemia, unspecified: Secondary | ICD-10-CM | POA: Diagnosis not present

## 2018-08-19 DIAGNOSIS — E783 Hyperchylomicronemia: Secondary | ICD-10-CM

## 2018-08-19 DIAGNOSIS — I4510 Unspecified right bundle-branch block: Secondary | ICD-10-CM

## 2018-08-19 DIAGNOSIS — Z951 Presence of aortocoronary bypass graft: Secondary | ICD-10-CM

## 2018-08-19 NOTE — Patient Instructions (Addendum)

## 2018-08-19 NOTE — Telephone Encounter (Signed)
Patient and/or DPR-approved person aware of AVS instructions and verbalized understanding. Letter including After Visit Summary and any other necessary documents to be mailed to the patient's address on file.  

## 2018-08-19 NOTE — Progress Notes (Signed)
Virtual Visit via Telephone Note   This visit type was conducted due to national recommendations for restrictions regarding the COVID-19 Pandemic (e.g. social distancing) in an effort to limit this patient's exposure and mitigate transmission in our community.  Due to his co-morbid illnesses, this patient is at least at moderate risk for complications without adequate follow up.  This format is felt to be most appropriate for this patient at this time.  The patient did not have access to video technology/had technical difficulties with video requiring transitioning to audio format only (telephone).  All issues noted in this document were discussed and addressed.  No physical exam could be performed with this format.  Please refer to the patient's chart for his  consent to telehealth for [facility].   Date:  08/19/2018   ID:  Juan [name], DOB 1965/12/24, MRN [id number]  Patient Location: Home Provider Location: Home  PCP:  [name], NP (Inactive)  Cardiologist:  Quay Burow, MD  Electrophysiologist:  None   Evaluation Performed:  Follow-Up Visit  Chief Complaint: Follow-up CAD, hypertension and hyperlipidemia  History of Present Illness:    Juan [name] is a 53 y.o.  mild to moderately overweight, married Caucasian male, father of 1 child who I last saw in the  01/07/2018. He has seen Dr. Sallyanne Kuster twice in 2011. He has a long history of insulin-dependent diabetes as well as extremely high hypertriglyceridemia in the 3000-6000 range with multiple episodes of pancreatitis. He had negative venous Dopplers for DVTs. He does wear compression stockings. He has complained of left inframammary chest pain and increasing shortness of breath. He is not aware of his family history since he was adopted. He had a CT scan of his chest and abdomen which showed a mass at the tail of his pancreas but also showed severe calcification in the LAD. He did have a Myoview stress test performed 2 years  ago that was nonischemic. At that time, he had no symptoms of chest pain or shortness of breath. I referred him to Dr. Lavetta Nielsen at [facility] for more intense treatment of his severe hypertriglyceridemia.since I saw him last in December of last several weeks he developed exertional chest pain and shortness of breath.I was concerned that he has developed progression of disease given all of his risk factors.  He had a Myoview stress test performed that showed inferior ischemia which is high risk. He continued to have exertional chest pain with left upper irradiation.I performed cardiac catheterization on him 02/03/13 revealing three-vessel disease and preserved LV function. He subsequently underwent coronary artery bypass bypass grafting x4 by Dr. Tharon Aquas Trigt with a LIMA to his LAD, a left radial to obtuse marginal branch, vein graft to an diagonal branch and PDA. His postop course was uncomplicated. He completed 7 weeks her cardiac rehabilitation and is back to work. Since I saw him in the office 4 months ago he has seen Kerin Ransom back who ordered a 2-D echo that showed normal LV systolic function, grade 2 diastolic dysfunction and a Myoview stress test that was normal as well.  Saw Dicky last in October he is complaining of chest pain and left upper extremity pain. Based on this I performed outpatient cardiac catheterization on him 12/17/16 revealing high-grade disease in his RCA SVG which is stented using several synergy drug-eluting stents. This resulted in resolution of his anginal symptoms. He also has recurrent cellulitis of his right lower extremity on antibiotics with chronic  lower extremity edema for which she uses compression stockings.  Since I saw him back in the office 8 months ago he is done well.  He was complaining some stress at that time which has since resolved.  He gets occasional atypical chest pain.  He has seen Dr. Debara Pickett at my request in the lipid clinic who  started him on Vascepa in addition to his other medications.  His most recent lipid profile performed 05/29/2018 revealed triglyceride level of nearly 6000.   The patient does not have symptoms concerning for COVID-19 infection (fever, chills, cough, or new shortness of breath).    Past Medical History:  Diagnosis Date  . Abnormal cardiovascular stress test 02/06/2013  . Anemia    occasional - no problems currently(10/02/2011)  . Bilateral renal cysts 06/16/2011   states no known problems  . Blood transfusion without reported diagnosis   . CAD (coronary artery disease), LAD 90%, 1st diag 95%, LCX 70% wth AV groove 90%, RCA 40-50% mid and 80% long distal stenosis 02/03/13 02/04/2013  . Chest pain    positive Myoview stress test  . Coronary artery calcification seen on CAT scan   . Diabetes mellitus    IDDM  . Fatty liver disease, nonalcoholic 8/54/6270  . Hyperlipidemia   . Hypertension    states no dx. of HTN, takes med. to protect kidneys due to DM  . Lateral meniscus tear 09/2011   left  . Loose body in knee 09/2011   loose bodies left knee  . Non Hodgkin's lymphoma ([place]) 1991  . Pancreatitis    occasional - last episode 06/2011  . S/P CABG x 4, 02/05/13 LIMA-LAD; LT. RADIAL-OM;VG-DIAG; VG-PDA 02/06/2013   10/18 3/4 patent grafts (occluded SVG-->Diag), PCI/DESx 3 SVG-->RCA, normal EF  . Splenomegaly, congestive, chronic   . Stuffy and runny nose 10/02/2011   yellow drainage from nose   Past Surgical History:  Procedure Laterality Date  . ABDOMINAL AORTIC ANEURYSM REPAIR    . ANTERIOR CERVICAL DECOMP/DISCECTOMY FUSION N/A 03/26/2018   Procedure: ANTERIOR CERVICAL DECOMPRESSION FUSION CERVICAL 5-6 WITH INSTRUMENTATION AND ALLOGRAFT;  Surgeon: Phylliss Bob, MD;  Location: [place];  Service: Orthopedics;  Laterality: N/A;  . CORONARY ARTERY BYPASS GRAFT N/A 02/05/2013   Procedure: CORONARY ARTERY BYPASS GRAFTING (CABG);  Surgeon: Ivin Poot, MD;  Location: [place];  Service: Open  Heart Surgery;  Laterality: N/A;  Coronary artery bypass graft on pump times four using left internal mammary artery and right greater saphenous vein via endovein harvest and left radial artery harvest.   . CORONARY STENT INTERVENTION  12/17/2016    PCI and drug-eluting stenting of the mid and distal RCA SVG   . CORONARY STENT INTERVENTION N/A 12/17/2016   Procedure: CORONARY STENT INTERVENTION;  Surgeon: Lorretta Harp, MD;  Location: [place] [facility];  Service: Cardiovascular;  Laterality: N/A;  . ELBOW SURGERY    . HERNIA REPAIR     inguinal   . herniated disc    . INTRAOPERATIVE TRANSESOPHAGEAL ECHOCARDIOGRAM N/A 02/05/2013   Procedure: INTRAOPERATIVE TRANSESOPHAGEAL ECHOCARDIOGRAM;  Surgeon: Ivin Poot, MD;  Location: [place];  Service: Open Heart Surgery;  Laterality: N/A;  . KNEE ARTHROSCOPY  10/09/2011   Procedure: ARTHROSCOPY KNEE;  Surgeon: Nita Sells, MD;  Location: [place];  Service: Orthopedics;  Laterality: Left;  . LEFT HEART CATH AND CORS/GRAFTS ANGIOGRAPHY N/A 12/17/2016   Procedure: LEFT HEART CATH AND CORS/GRAFTS ANGIOGRAPHY;  Surgeon: Lorretta Harp, MD;  Location: [facility]  INVASIVE CV LAB;  Service: Cardiovascular;  Laterality: N/A;  . LEFT HEART CATHETERIZATION WITH CORONARY ANGIOGRAM N/A 02/03/2013   Procedure: LEFT HEART CATHETERIZATION WITH CORONARY ANGIOGRAM;  Surgeon: Lorretta Harp, MD;  Location: [facility] CATH LAB;  Service: Cardiovascular;  Laterality: N/A;  . NASAL SEPTUM SURGERY    . RADIAL ARTERY HARVEST Left 02/05/2013   Procedure: RADIAL ARTERY HARVEST;  Surgeon: Ivin Poot, MD;  Location: [place];  Service: Vascular;  Laterality: Left;  . TIBIA BONE BIOPSY  x 3   left  . TONSILLECTOMY       Current Meds  Medication Sig  . aspirin 81 MG chewable tablet Chew 1 tablet (81 mg total) by mouth daily.  . clopidogrel (PLAVIX) 75 MG tablet TAKE 1 TABLET (75 MG TOTAL) BY MOUTH DAILY WITH BREAKFAST.  Marland Kitchen Continuous Blood Gluc  Receiver (FREESTYLE LIBRE 14 DAY READER) DEVI daily. as directed  . Continuous Blood Gluc Sensor (FREESTYLE LIBRE 14 DAY SENSOR) MISC USE AS DIRECTED EVERY 14 DAYS  . FARXIGA 5 MG TABS tablet Take 5 mg by mouth daily.  . fenofibrate 160 MG tablet Take 160 mg by mouth daily.   . furosemide (LASIX) 40 MG tablet Take 1 tablet (40 mg total) by mouth daily.  Vanessa Kick Ethyl 1 g CAPS Take 2 capsules (2 g total) by mouth 2 (two) times daily.  . Insulin Human (INSULIN PUMP) SOLN Inject into the skin.  Marland Kitchen insulin regular human CONCENTRATED (HUMULIN R) 500 UNIT/ML SOLN injection Inject into the skin continuous. Using insulin pump  . metFORMIN (GLUCOPHAGE) 1000 MG tablet Take 1,000 mg by mouth 2 (two) times daily with a meal.  . metoprolol succinate (TOPROL-XL) 50 MG 24 hr tablet TAKE 1 TABLET EVERY DAY WITH OR IMMEDIATELY FOLLOWING A MEAL  . nitroGLYCERIN (NITROSTAT) 0.4 MG SL tablet Place 1 tablet (0.4 mg total) under the tongue every 5 (five) minutes as needed.  . ramipril (ALTACE) 5 MG capsule Take 5 mg by mouth daily.  . simvastatin (ZOCOR) 10 MG tablet TAKE 1 TABLET BY MOUTH EVERY DAY     Allergies:   Reglan [metoclopramide]   Social History   Tobacco Use  . Smoking status: Never Smoker  . Smokeless tobacco: Never Used  Substance Use Topics  . Alcohol use: No  . Drug use: No     Family Hx: The patient's family history is not on file. He was adopted.  ROS:   Please see the history of present illness.     All other systems reviewed and are negative.   Prior CV studies:   The following studies were reviewed today:  None  Labs/Other Tests and Data Reviewed:    EKG:  No ECG reviewed.  Recent Labs: 03/24/2018: ALT 65; BUN 22; Creatinine, Ser 1.05; Hemoglobin 15.7; Platelets 275; Potassium 5.2; Sodium 135   Recent Lipid Panel Lab Results  Component Value Date/Time   CHOL 515 (H) 05/29/2018 11:23 AM   TRIG 5,975 (HH) 05/29/2018 11:23 AM   HDL 3 (L) 05/29/2018 11:23 AM    CHOLHDL 171.7 (H) 05/29/2018 11:23 AM   CHOLHDL 14.2 06/17/2011 04:00 AM   LDLCALC Comment 05/29/2018 11:23 AM   LDLDIRECT 6 05/29/2018 11:23 AM    Wt Readings from Last 3 Encounters:  08/19/18 228 lb (103.4 kg)  06/02/18 230 lb 6.4 oz (104.5 kg)  03/26/18 231 lb (104.8 kg)     Objective:    Vital Signs:  Ht 5' 10.5" (1.791 m)   Wt 228  lb (103.4 kg)   BMI 32.25 kg/m    VITAL SIGNS:  reviewed a complete physical exam was not performed today since this was a virtual telemedicine phone visit  ASSESSMENT & PLAN:    1. Coronary artery disease- history of CAD status post cardiac catheterization by myself 02/03/2013 revealing three-vessel disease ultimately requiring CABG x4 by Dr. Darcey Nora.  He had a LIMA to the LAD, left radial to an obtuse marginal branch and a vein to diagonal branch and PDA.  I cathed him 12/17/2016 revealing a severely diseased RCA vein graft which I stented multiple locations.  The vein graft to his diagonal branch was occluded.  The LIMA to the LAD was patent as was the left radial to the obtuse marginal branch with normal LV function.  Since that time he is remained relatively stable. 2. Essential hypertension- on metoprolol and ramipril.  He did not check his blood pressure today 3. Hyperlipidemia- he most likely has familial hypertriglyceridemia with a lipid profile performed 05/29/2018 revealing a triglyceride level of nearly 6000.  He is on fenofibrate, simvastatin and Vascepa followed by Dr. Debara Pickett in the lipid clinic.  COVID-19 Education: The signs and symptoms of COVID-19 were discussed with the patient and how to seek care for testing (follow up with PCP or arrange E-visit).  The importance of social distancing was discussed today.  Time:   Today, I have spent 5 minutes with the patient with telehealth technology discussing the above problems.     Medication Adjustments/Labs and Tests Ordered: Current medicines are reviewed at length with the patient today.   Concerns regarding medicines are outlined above.   Tests Ordered: No orders of the defined types were placed in this encounter.   Medication Changes: No orders of the defined types were placed in this encounter.   Disposition:  Follow up in 6 month(s)  Signed, Quay Burow, MD  08/19/2018 2:58 PM    [place]

## 2018-09-16 ENCOUNTER — Other Ambulatory Visit: Payer: Self-pay | Admitting: Internal Medicine

## 2018-11-03 ENCOUNTER — Other Ambulatory Visit: Payer: Self-pay | Admitting: *Deleted

## 2018-11-03 DIAGNOSIS — Z794 Long term (current) use of insulin: Secondary | ICD-10-CM

## 2018-11-03 DIAGNOSIS — E785 Hyperlipidemia, unspecified: Secondary | ICD-10-CM

## 2018-11-03 DIAGNOSIS — E119 Type 2 diabetes mellitus without complications: Secondary | ICD-10-CM

## 2018-11-09 IMAGING — CR DG CHEST 2V
3 series · 3 of 3 positions shown · non-contrast
Comparison: 03/04/2013

CLINICAL DATA: Pre testing for cardiac cath, recent chest pains,
past hx of CABG, past hx of NHL

EXAM:
CHEST  2 VIEW

[w chest pa (1 of 2)]
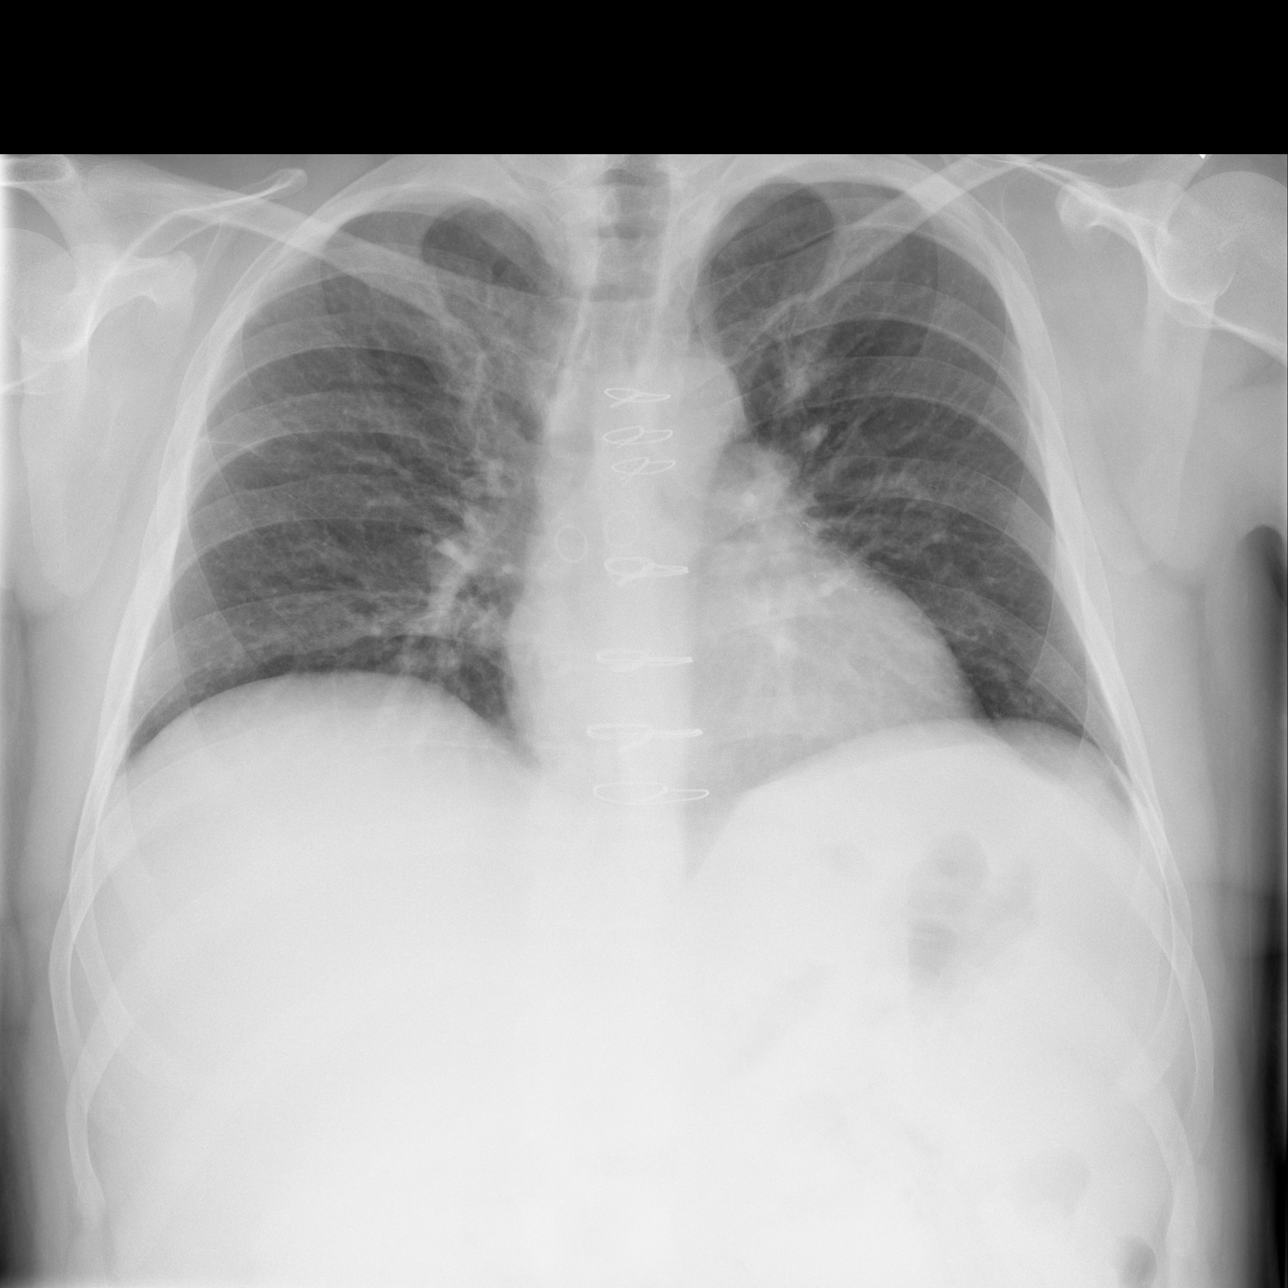

[w chest lat]
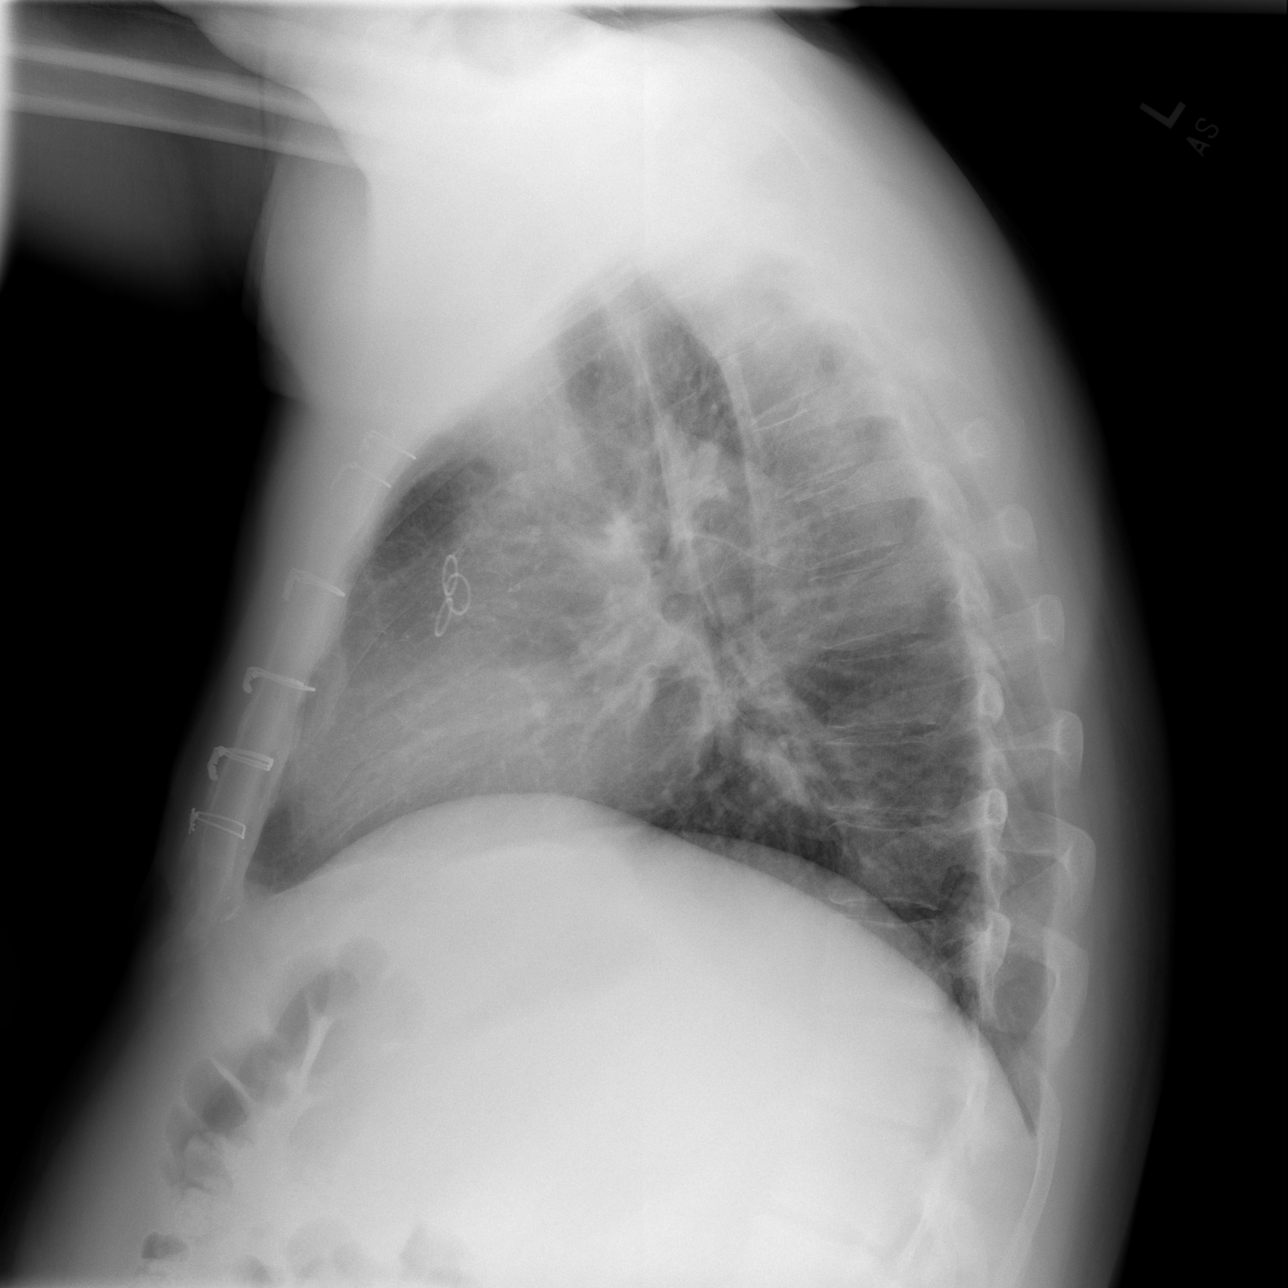

[w chest pa (2 of 2)]
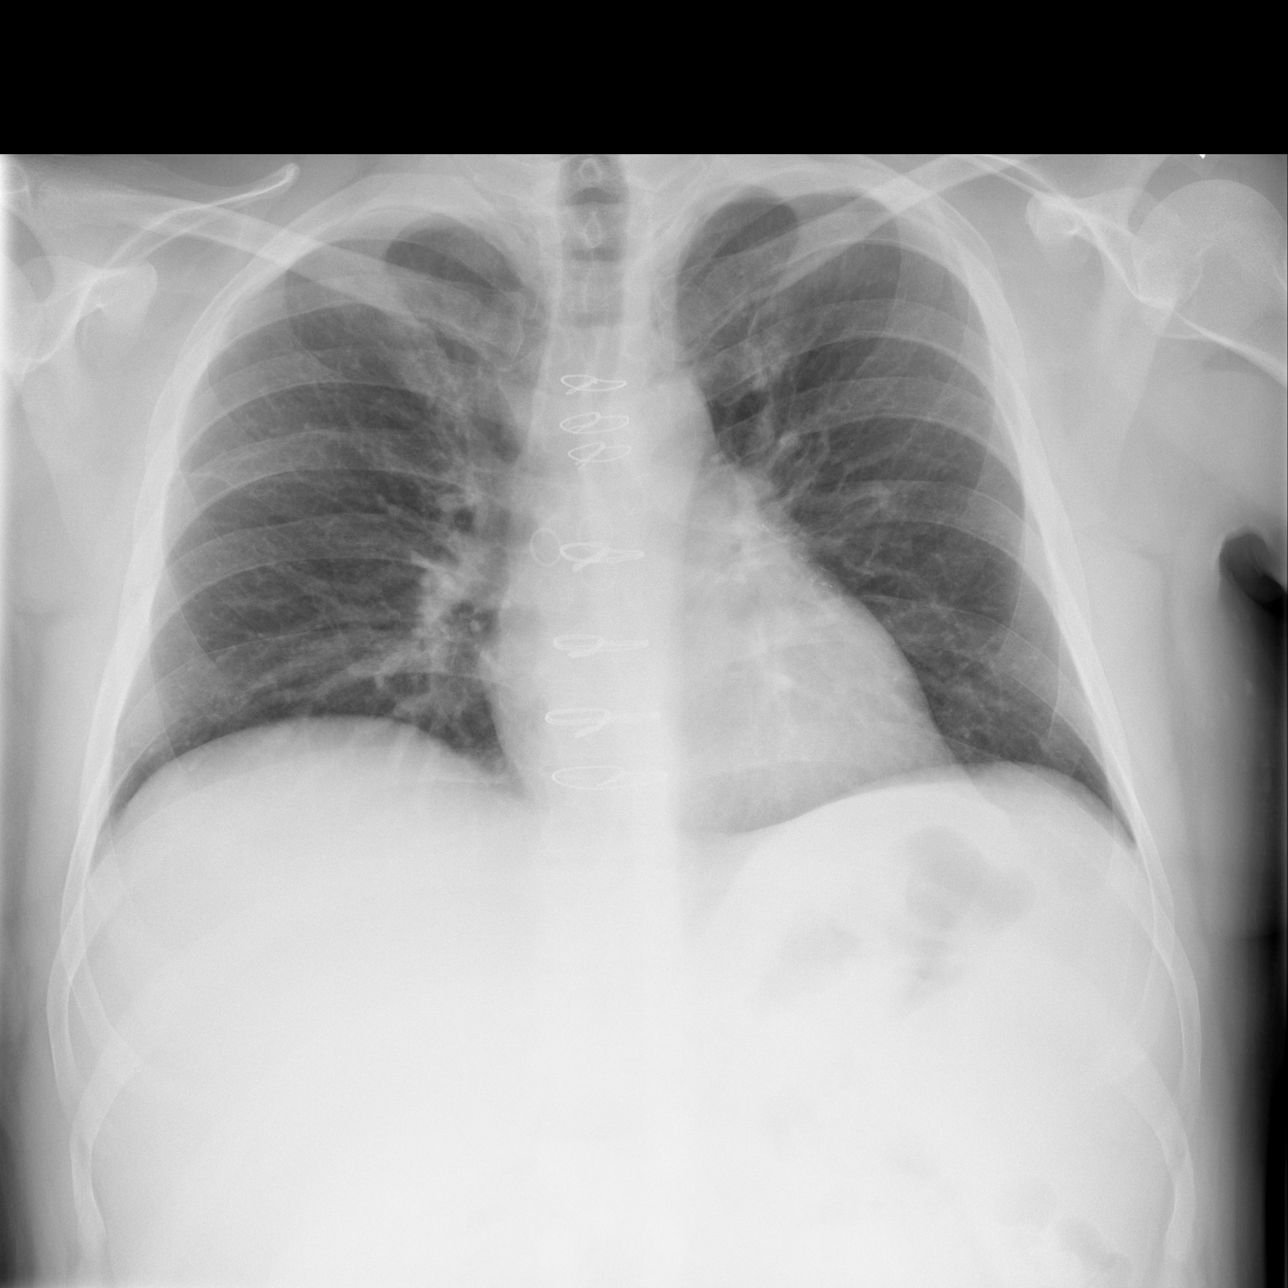

[3 of 3 positions shown; findings below may reference images not displayed]

FINDINGS: Status post median sternotomy. Heart size is normal. There is mild
perihilar peribronchial thickening, stable in appearance. No focal
consolidations or pleural effusions. No pulmonary edema.
IMPRESSION: Stable bronchitic changes.  No focal acute pulmonary abnormality.

## 2018-11-13 NOTE — Progress Notes (Signed)
[facility] Note  11/17/2018     CHIEF COMPLAINT Patient presents for Retina Follow Up   HISTORY OF PRESENT ILLNESS: Juan [name] is a 53 y.o. male who presents to the clinic today for:   HPI    Retina Follow Up    Patient presents with  Diabetic Retinopathy.  In both eyes.  Severity is moderate.  Duration of 3 months.  I, the attending physician,  performed the HPI with the patient and updated documentation appropriately.          Comments    Patient states vision the same OU. BS was 180 last night after dinner. Last a1c was 9.0 January 2020.        Last edited by Bernarda Caffey, MD on 11/17/2018 10:12 AM. (History)     pt states he is not having any eye problems, pt states he is still on all his lipid medications, he states he has been told there are new medications coming out that may help him more than his current medications, pt states he is supposed to be upgrading his insulin pump and it is going to have a continuous glucose monitor on it  Referring physician: No referring provider defined for this encounter.  HISTORICAL INFORMATION:   Selected notes from the MEDICAL RECORD NUMBER Referred by Dr. [name] for DM exam LEE-  Ocular Hx-  PMH- type II DM, HTN, hyperlipidemia, hypertriglyceridemia, anemia, non hodgekins lymphoma, sleep apnea; last A1C 8.0    CURRENT MEDICATIONS: No current outpatient medications on file. (Ophthalmic Drugs)   No current facility-administered medications for this visit.  (Ophthalmic Drugs)   Current Outpatient Medications (Other)  Medication Sig  . aspirin 81 MG chewable tablet Chew 1 tablet (81 mg total) by mouth daily.  . clopidogrel (PLAVIX) 75 MG tablet TAKE 1 TABLET (75 MG TOTAL) BY MOUTH DAILY WITH BREAKFAST.  Marland Kitchen Continuous Blood Gluc Receiver (FREESTYLE LIBRE 14 DAY READER) DEVI daily. as directed  . Continuous Blood Gluc Sensor (FREESTYLE LIBRE 14 DAY SENSOR) MISC USE AS DIRECTED EVERY 14 DAYS  .  FARXIGA 5 MG TABS tablet Take 5 mg by mouth daily.  . fenofibrate 160 MG tablet Take 160 mg by mouth daily.   . furosemide (LASIX) 40 MG tablet Take 1 tablet (40 mg total) by mouth daily.  . Insulin Human (INSULIN PUMP) SOLN Inject into the skin.  Marland Kitchen insulin regular human CONCENTRATED (HUMULIN R) 500 UNIT/ML SOLN injection Inject into the skin continuous. Using insulin pump  . metFORMIN (GLUCOPHAGE) 1000 MG tablet Take 1,000 mg by mouth 2 (two) times daily with a meal.  . metoprolol succinate (TOPROL-XL) 50 MG 24 hr tablet TAKE 1 TABLET EVERY DAY WITH OR IMMEDIATELY FOLLOWING A MEAL  . nitroGLYCERIN (NITROSTAT) 0.4 MG SL tablet Place 1 tablet (0.4 mg total) under the tongue every 5 (five) minutes as needed.  . ramipril (ALTACE) 5 MG capsule Take 5 mg by mouth daily.  . simvastatin (ZOCOR) 10 MG tablet TAKE 1 TABLET BY MOUTH EVERY DAY  . VASCEPA 1 g CAPS TAKE 2 CAPSULES TWICE A DAY  . isosorbide mononitrate (IMDUR) 30 MG 24 hr tablet Take 0.5 tablets (15 mg total) by mouth daily. (Patient taking differently: Take 15 mg by mouth daily as needed (chest pain). )   No current facility-administered medications for this visit.  (Other)      REVIEW OF SYSTEMS: ROS    Positive for: Genitourinary, Endocrine, Eyes   Negative  for: Constitutional, Gastrointestinal, Neurological, Skin, Musculoskeletal, HENT, Cardiovascular, Respiratory, Psychiatric, Allergic/Imm, Heme/Lymph   Last edited by Jobe Marker, COT on 11/17/2018  8:55 AM. (History)    pt states there has been no change in his vision, pt states [name] is his primary ophthalmologist   ALLERGIES Allergies  Allergen Reactions  . Reglan [Metoclopramide] Other (See Comments)    Only can tolerate in low doses, in higher doses it has the opposite effect    PAST MEDICAL HISTORY Past Medical History:  Diagnosis Date  . Abnormal cardiovascular stress test 02/06/2013  . Anemia    occasional - no problems currently(10/02/2011)  . Bilateral  renal cysts 06/16/2011   states no known problems  . Blood transfusion without reported diagnosis   . CAD (coronary artery disease), LAD 90%, 1st diag 95%, LCX 70% wth AV groove 90%, RCA 40-50% mid and 80% long distal stenosis 02/03/13 02/04/2013  . Chest pain    positive Myoview stress test  . Coronary artery calcification seen on CAT scan   . Diabetes mellitus    IDDM  . Fatty liver disease, nonalcoholic [id number]  . Hyperlipidemia   . Hypertension    states no dx. of HTN, takes med. to protect kidneys due to DM  . Lateral meniscus tear 09/2011   left  . Loose body in knee 09/2011   loose bodies left knee  . Non Hodgkin's lymphoma ([place]) 1991  . Pancreatitis    occasional - last episode 06/2011  . S/P CABG x 4, 02/05/13 LIMA-LAD; LT. RADIAL-OM;VG-DIAG; VG-PDA 02/06/2013   10/18 3/4 patent grafts (occluded SVG-->Diag), PCI/DESx 3 SVG-->RCA, normal EF  . Splenomegaly, congestive, chronic   . Stuffy and runny nose 10/02/2011   yellow drainage from nose   Past Surgical History:  Procedure Laterality Date  . ABDOMINAL AORTIC ANEURYSM REPAIR    . ANTERIOR CERVICAL DECOMP/DISCECTOMY FUSION N/A 03/26/2018   Procedure: ANTERIOR CERVICAL DECOMPRESSION FUSION CERVICAL 5-6 WITH INSTRUMENTATION AND ALLOGRAFT;  Surgeon: Phylliss Bob, MD;  Location: [facility];  Service: Orthopedics;  Laterality: N/A;  . CORONARY ARTERY BYPASS GRAFT N/A 02/05/2013   Procedure: CORONARY ARTERY BYPASS GRAFTING (CABG);  Surgeon: Ivin Poot, MD;  Location: [facility];  Service: Open Heart Surgery;  Laterality: N/A;  Coronary artery bypass graft on pump times four using left internal mammary artery and right greater saphenous vein via endovein harvest and left radial artery harvest.   . CORONARY STENT INTERVENTION  12/17/2016    PCI and drug-eluting stenting of the mid and distal RCA SVG   . CORONARY STENT INTERVENTION N/A 12/17/2016   Procedure: CORONARY STENT INTERVENTION;  Surgeon: Lorretta Harp, MD;  Location: [facility] [facility];  Service: Cardiovascular;  Laterality: N/A;  . ELBOW SURGERY    . HERNIA REPAIR     inguinal   . herniated disc    . INTRAOPERATIVE TRANSESOPHAGEAL ECHOCARDIOGRAM N/A 02/05/2013   Procedure: INTRAOPERATIVE TRANSESOPHAGEAL ECHOCARDIOGRAM;  Surgeon: Ivin Poot, MD;  Location: [facility];  Service: Open Heart Surgery;  Laterality: N/A;  . KNEE ARTHROSCOPY  10/09/2011   Procedure: ARTHROSCOPY KNEE;  Surgeon: Nita Sells, MD;  Location: [facility];  Service: Orthopedics;  Laterality: Left;  . LEFT HEART CATH AND CORS/GRAFTS ANGIOGRAPHY N/A 12/17/2016   Procedure: LEFT HEART CATH AND CORS/GRAFTS ANGIOGRAPHY;  Surgeon: Lorretta Harp, MD;  Location: [name];  Service: Cardiovascular;  Laterality: N/A;  . LEFT HEART CATHETERIZATION WITH CORONARY ANGIOGRAM N/A 02/03/2013  Procedure: LEFT HEART CATHETERIZATION WITH CORONARY ANGIOGRAM;  Surgeon: Lorretta Harp, MD;  Location[facility];  Service: Cardiovascular;  Laterality: N/A;  . NASAL SEPTUM SURGERY    . RADIAL ARTERY HARVEST Left 02/05/2013   Procedure: RADIAL ARTERY HARVEST;  Surgeon: Ivin Poot, MD;  Location: [facility];  Service: Vascular;  Laterality: Left;  . TIBIA BONE BIOPSY  x 3   left  . TONSILLECTOMY      FAMILY HISTORY Family History  Adopted: Yes    SOCIAL HISTORY Social History   Tobacco Use  . Smoking status: Never Smoker  . Smokeless tobacco: Never Used  Substance Use Topics  . Alcohol use: No  . Drug use: No         OPHTHALMIC EXAM:  Base Eye Exam    Visual Acuity (Snellen - Linear)      Right Left   Dist cc 20/20 -2 20/20 -2   Dist ph cc 20/20 20/20   Correction: Glasses       Tonometry (Tonopen, 9:03 AM)      Right Left   Pressure 13 12       Pupils      Dark Light Shape React APD   Right 4 3 Round Brisk None   Left 4 3 Round Brisk None       Visual Fields (Counting fingers)      Left Right    Full Full       Extraocular  Movement      Right Left    Full, Ortho Full, Ortho       Neuro/Psych    Oriented x3: Yes   Mood/Affect: Normal       Dilation    Both eyes: 1.0% Mydriacyl, 2.5% Phenylephrine @ 9:03 AM        Slit Lamp and Fundus Exam    Slit Lamp Exam      Right Left   Lids/Lashes Normal Normal   Conjunctiva/Sclera White and quiet White and quiet   Cornea Inferior-temporal K scar, 1-2+ Punctate epithelial erosions, mild Arcus 2+ inferior Punctate epithelial erosions, mild Arcus, scattered peripheral areas of corneal haze / mild scarring/Salzmann's nodules   Anterior Chamber Deep and quiet Deep and quiet   Iris Round and dilated, No NVI Round and dilated, No NVI   Lens 1-2+ Nuclear sclerosis, 1-2+ Cortical cataract 1-2+ Nuclear sclerosis, 2+ Cortical cataract   Vitreous Vitreous syneresis Vitreous syneresis       Fundus Exam      Right Left   Disc Pink and Sharp, no NVD pink and sharp; No NVD   C/D Ratio 0.3 0.3   Macula Good foveal reflex, few Microaneurysms, mild Retinal pigment epithelial mottling, macroaneurysms along arcades, focal collections of exudate along IT temporal arcades Good foveal reflex, few Microaneurysms, focal patches of exudate superior macula, no edema; focal patch of IRH/exudate just inside IT arcades   Vessels Tortuous, Copper wiring Tortuous, Copper wiring   Periphery Attached, scattered punctate RPE changes, focal area of exudation just above SN arcade -- stable from prior; scattered small patches of IRH and exudates --stable from prior Attached, scattered IRH; focal IRH w/ mild surrounding exudation--0800 midzone, focal hemorrhage/exudation at 1130 1.5 DD from disc -- increased from prior; new [facility] along proximal ST arcades        Refraction    Wearing Rx      Sphere Cylinder Axis Add   Right -4.00 Sphere  +1.25   Left -3.75 +0.50  060 +1.25          IMAGING AND PROCEDURES  Imaging and Procedures for 05/23/17  OCT, Retina - OU - Both Eyes       Right  Eye Quality was good. Central Foveal Thickness: 289. Progression has been stable. Findings include normal foveal contour, no IRF, no SRF, vitreomacular adhesion .   Left Eye Quality was good. Central Foveal Thickness: 301. Progression has worsened. Findings include normal foveal contour, no IRF, no SRF, vitreomacular adhesion , intraretinal hyper-reflective material (Focal areas of IRHM nasal to disc -- mild interval increase).   Notes *Images captured and stored on drive  Diagnosis / Impression:  NFP, No IRF/SRF VMA, focal area of IRHM OS -- sup and inf nasal to disc - mild interval increase  Clinical management:  See below  Abbreviations: NFP - Normal foveal profile. CME - cystoid macular edema. PED - pigment epithelial detachment. IRF - intraretinal fluid. SRF - subretinal fluid. EZ - ellipsoid zone. ERM - epiretinal membrane. ORA - outer retinal atrophy. ORT - outer retinal tubulation. SRHM - subretinal hyper-reflective material         Color Fundus Photography Optos - OU - Both Eyes       Right Eye Progression has been stable. Disc findings include normal observations. Macula : exudates, normal observations (Tr exudates). Vessels : tortuous vessels, attenuated. Periphery : exudates, hemorrhage (Mild scattered IRH and exudates).   Left Eye Progression has worsened. Disc findings include normal observations. Macula : exudates. Vessels : attenuated, tortuous vessels (+copper wiring). Periphery : exudates, hemorrhage.   Notes **Images stored on drive**  Impression: Focal patches of IRH and exudate OU (OS >>> OD)  OS: interval increase in heme and exudation superior to disc                 ASSESSMENT/PLAN:    ICD-10-CM   1. Diabetic retinal microaneurysm (HCC)  E11.319 Color Fundus Photography Optos - OU - Both Eyes   H35.049   2. Moderate nonproliferative diabetic retinopathy of both eyes without macular edema associated with type 2 diabetes mellitus ([place])   [id number]   3. Retinal edema  H35.81 OCT, Retina - OU - Both Eyes  4. Essential hypertension  I10   5. Hypertensive retinopathy of both eyes  H35.033   6. Dyslipidemia  E78.5   7. Combined forms of age-related cataract of both eyes  H25.813     1-3. DMII w/ nonproliferative diabetic retinopathy and noncentral diabetic retinal edema, both eyes  - h/o focal areas of hemorrhage and exudates OU -- prior exams with progressively worsening focal hemorrhage and exudates  - exam today shows striking interval worsening of copper wiring of arterial vessels -- likely related to uncontrolled hypertriglyceridemia and hyperchylomicronemia  - prior FAs show non central, focal areas of late leaking MA with surrounding capillary nonperfusion  - OCT without central diabetic macular edema, both eyes, but focal areas of retinal edema / thickening corresponding to peripheral / noncentral areas of hemorrhage and exudation  - pt reports history of CAD with stents placed Oct 2018, currently on plavix for 1 year -- possibly contributing to pathology.  - S/P focal laser OS (09.16.19)  - good initial response but now lesions have persistent hemorrhage and exudation OU, OS worsening  - BCVA remains stable-- 20/20 OU  - discussed findings and possible treatment option of intravitreal anti-VEGF therapy -- pt declines at this time  - f/u in 3 months with wide field OCT and color  fundus photos  4-6. Hypertensive retinopathy and dyslipidemia / hypertriglyceridemia OU  - discussed importance of tight BP control  - BP likely contributing to retinal vascular pathology above  - significant worsening of arterial vessel appearance OU -- increased copper wiring -- likely related to uncontrolled hypertriglyceridemia  - monitor for now  7. Combined form age-related cataract OU-   - The symptoms of cataract, surgical options, and treatments and risks were discussed with patient.  - discussed diagnosis and progression  - not yet  visually significant  - monitor for now   Ophthalmic Meds Ordered this visit:  No orders of the defined types were placed in this encounter.      Return in about 3 months (around 02/16/2019) for f/u DM exam.  There are no Patient Instructions on file for this visit.   Explained the diagnoses, plan, and follow up with the patient and they expressed understanding.  Patient expressed understanding of the importance of proper follow up care.   This document serves as a record of services personally performed by Gardiner Sleeper, MD, PhD. It was created on their behalf by Roselee Nova, COMT. The creation of this record is the provider's dictation and/or activities during the visit.  Electronically signed by: Roselee Nova, COMT 11/17/18 8:32 PM   This document serves as a record of services personally performed by Gardiner Sleeper, MD, PhD. It was created on their behalf by Ernest Mallick, OA, an ophthalmic assistant. The creation of this record is the provider's dictation and/or activities during the visit.    Electronically signed by: Ernest Mallick, OA  08.31.2020 8:32 PM    Gardiner Sleeper, M.D., Ph.D. Diseases & Surgery of the Retina and Vitreous [facility]  I have reviewed the above documentation for accuracy and completeness, and I agree with the above. Gardiner Sleeper, M.D., Ph.D. 11/17/18 8:32 PM    Abbreviations: M myopia (nearsighted); A astigmatism; H hyperopia (farsighted); P presbyopia; Mrx spectacle prescription;  CTL contact lenses; OD right eye; OS left eye; OU both eyes  XT exotropia; ET esotropia; PEK punctate epithelial keratitis; PEE punctate epithelial erosions; DES dry eye syndrome; MGD meibomian gland dysfunction; ATs artificial tears; PFAT's preservative free artificial tears; [place] nuclear sclerotic cataract; PSC posterior subcapsular cataract; ERM epi-retinal membrane; PVD posterior vitreous detachment; RD retinal detachment; DM diabetes  mellitus; DR diabetic retinopathy; NPDR non-proliferative diabetic retinopathy; PDR proliferative diabetic retinopathy; CSME clinically significant macular edema; DME diabetic macular edema; dbh dot blot hemorrhages; CWS cotton wool spot; POAG primary open angle glaucoma; C/D cup-to-disc ratio; HVF humphrey visual field; GVF goldmann visual field; OCT optical coherence tomography; IOP intraocular pressure; BRVO Branch retinal vein occlusion; CRVO central retinal vein occlusion; CRAO central retinal artery occlusion; BRAO branch retinal artery occlusion; RT retinal tear; SB scleral buckle; PPV pars plana vitrectomy; VH Vitreous hemorrhage; PRP panretinal laser photocoagulation; IVK intravitreal kenalog; VMT vitreomacular traction; MH Macular hole;  NVD neovascularization of the disc; NVE neovascularization elsewhere; AREDS age related eye disease study; ARMD age related macular degeneration; POAG primary open angle glaucoma; EBMD epithelial/anterior basement membrane dystrophy; ACIOL anterior chamber intraocular lens; IOL intraocular lens; PCIOL posterior chamber intraocular lens; Phaco/IOL phacoemulsification with intraocular lens placement; [place] photorefractive keratectomy; LASIK laser assisted in situ keratomileusis; HTN hypertension; DM diabetes mellitus; COPD chronic obstructive pulmonary disease

## 2018-11-17 ENCOUNTER — Encounter (INDEPENDENT_AMBULATORY_CARE_PROVIDER_SITE_OTHER): Payer: Self-pay | Admitting: Ophthalmology

## 2018-11-17 ENCOUNTER — Other Ambulatory Visit: Payer: Self-pay

## 2018-11-17 ENCOUNTER — Ambulatory Visit (INDEPENDENT_AMBULATORY_CARE_PROVIDER_SITE_OTHER): Payer: Commercial Managed Care - PPO | Admitting: Ophthalmology

## 2018-11-17 DIAGNOSIS — I10 Essential (primary) hypertension: Secondary | ICD-10-CM | POA: Diagnosis not present

## 2018-11-17 DIAGNOSIS — H3581 Retinal edema: Secondary | ICD-10-CM | POA: Diagnosis not present

## 2018-11-17 DIAGNOSIS — H25813 Combined forms of age-related cataract, bilateral: Secondary | ICD-10-CM

## 2018-11-17 DIAGNOSIS — E11319 Type 2 diabetes mellitus with unspecified diabetic retinopathy without macular edema: Secondary | ICD-10-CM

## 2018-11-17 DIAGNOSIS — H35033 Hypertensive retinopathy, bilateral: Secondary | ICD-10-CM

## 2018-11-17 DIAGNOSIS — E113393 Type 2 diabetes mellitus with moderate nonproliferative diabetic retinopathy without macular edema, bilateral: Secondary | ICD-10-CM | POA: Diagnosis not present

## 2018-11-17 DIAGNOSIS — H35049 Retinal micro-aneurysms, unspecified, unspecified eye: Secondary | ICD-10-CM

## 2018-11-17 DIAGNOSIS — E785 Hyperlipidemia, unspecified: Secondary | ICD-10-CM

## 2018-12-29 ENCOUNTER — Encounter: Payer: Self-pay | Admitting: Internal Medicine

## 2018-12-29 ENCOUNTER — Other Ambulatory Visit: Payer: Self-pay

## 2018-12-29 ENCOUNTER — Ambulatory Visit: Payer: Commercial Managed Care - PPO | Admitting: Internal Medicine

## 2018-12-29 VITALS — BP 153/88 | HR 91 | Ht 70.5 in | Wt 235.6 lb

## 2018-12-29 DIAGNOSIS — E119 Type 2 diabetes mellitus without complications: Secondary | ICD-10-CM | POA: Diagnosis not present

## 2018-12-29 DIAGNOSIS — Z951 Presence of aortocoronary bypass graft: Secondary | ICD-10-CM | POA: Diagnosis not present

## 2018-12-29 DIAGNOSIS — E783 Hyperchylomicronemia: Secondary | ICD-10-CM

## 2018-12-29 DIAGNOSIS — Z794 Long term (current) use of insulin: Secondary | ICD-10-CM | POA: Diagnosis not present

## 2018-12-29 NOTE — Progress Notes (Addendum)
LIPID CLINIC CONSULT NOTE  Chief Complaint:  No complaints  Primary Care Physician: [name], NP (Inactive)  HPI:  Juan [name] is a 53 y.o. male who is being seen today for the evaluation of high triglycerides at the request of No ref. provider found.  Juan [name] is seen today for evaluation of elevated triglycerides.  He has been a patient of Dr. Lavetta Nielsen at [facility] for many years.  He was last seen there in 2018.  He has hyperchylomicronemia.  Triglycerides have been very high and most recently up to 4730.  He says at best his triglycerides have come down to 450, but this was after very strict low saturated fat and calorie diet.  He says he has not been able to maintain that.  He also has diabetes and his blood sugars have not been optimally controlled.  His most recent A1c in September 2019 was 9.6.  He sees [name].  He has been on fenofibrate 160 mg.  In addition he takes low-dose simvastatin 10 mg.  He has a history of steatohepatitis and elevated liver enzymes in the past however most recently his ALT was 62 in September.  He also has clinical coronary disease having been found to have multivessel coronary disease in 2014 and subsequently he underwent coronary artery bypass grafting and has had multivessel PCI.  He was previously considered for niacin therapy however never really took that because of not being able to achieve good glycemic control.  He is not previously been on any omega-3's and had been considered for clinical trials but was not enrolled.  06/02/2018  Juan [name] returns today for follow-up of elevated triglycerides.  Fortunately, he has had a significant increase in his triglycerides since we last saw him.  His most recent labs showed total cholesterol of 515 with triglycerides of 5975.  HDL was 3 and LDL was 6 indicating persistent familial chylomicronemia syndrome.  He does say that his diet has significantly altered recently due to increased demands at work as  well as increased demands on his wife who works in a school [employer].  Although schools are now close due to the COVID-19 virus, she is now working extended hours to produce foods for children who are not at school.  He does report compliance with his medications.  12/29/2018  Juan [name] is seen today for follow-up.  Overall he seems to be doing well without any new chest pain or worsening shortness of breath.  Unfortunate his triglycerides remain elevated.  They have come down some in the mid 3600s.  His hemoglobin A1c is improved as well in the low sevens.  Unfortunately, were not able to get his triglycerides to be normalized at this point.  He is on a strict diet to lower saturated fats.  He is on maximal therapy at this point with fibrate and Vascepa.  We will need to investigate a possible clinical trial options for him.  LDL has been as low as 6.   PMHx:  Past Medical History:  Diagnosis Date  . Abnormal cardiovascular stress test 02/06/2013  . Anemia    occasional - no problems currently(10/02/2011)  . Bilateral renal cysts 06/16/2011   states no known problems  . Blood transfusion without reported diagnosis   . CAD (coronary artery disease), LAD 90%, 1st diag 95%, LCX 70% wth AV groove 90%, RCA 40-50% mid and 80% long distal stenosis 02/03/13 02/04/2013  . Chest pain    positive Myoview stress  test  . Coronary artery calcification seen on CAT scan   . Diabetes mellitus    IDDM  . Fatty liver disease, nonalcoholic 123456  . Hyperlipidemia   . Hypertension    states no dx. of HTN, takes med. to protect kidneys due to DM  . Lateral meniscus tear 09/2011   left  . Loose body in knee 09/2011   loose bodies left knee  . Non Hodgkin's lymphoma ([place]) 1991  . Pancreatitis    occasional - last episode 06/2011  . S/P CABG x 4, 02/05/13 LIMA-LAD; LT. RADIAL-OM;VG-DIAG; VG-PDA 02/06/2013   10/18 3/4 patent grafts (occluded SVG-->Diag), PCI/DESx 3 SVG-->RCA, normal EF  . Splenomegaly,  congestive, chronic   . Stuffy and runny nose 10/02/2011   yellow drainage from nose    Past Surgical History:  Procedure Laterality Date  . ABDOMINAL AORTIC ANEURYSM REPAIR    . ANTERIOR CERVICAL DECOMP/DISCECTOMY FUSION N/A 03/26/2018   Procedure: ANTERIOR CERVICAL DECOMPRESSION FUSION CERVICAL 5-6 WITH INSTRUMENTATION AND ALLOGRAFT;  Surgeon: Phylliss Bob, MD;  Location: [place];  Service: Orthopedics;  Laterality: N/A;  . CORONARY ARTERY BYPASS GRAFT N/A 02/05/2013   Procedure: CORONARY ARTERY BYPASS GRAFTING (CABG);  Surgeon: Ivin Poot, MD;  Location: [place];  Service: Open Heart Surgery;  Laterality: N/A;  Coronary artery bypass graft on pump times four using left internal mammary artery and right greater saphenous vein via endovein harvest and left radial artery harvest.   . CORONARY STENT INTERVENTION  12/17/2016    PCI and drug-eluting stenting of the mid and distal RCA SVG   . CORONARY STENT INTERVENTION N/A 12/17/2016   Procedure: CORONARY STENT INTERVENTION;  Surgeon: Lorretta Harp, MD;  Location: [place] [facility];  Service: Cardiovascular;  Laterality: N/A;  . ELBOW SURGERY    . HERNIA REPAIR     inguinal   . herniated disc    . INTRAOPERATIVE TRANSESOPHAGEAL ECHOCARDIOGRAM N/A 02/05/2013   Procedure: INTRAOPERATIVE TRANSESOPHAGEAL ECHOCARDIOGRAM;  Surgeon: Ivin Poot, MD;  Location: [place];  Service: Open Heart Surgery;  Laterality: N/A;  . KNEE ARTHROSCOPY  10/09/2011   Procedure: ARTHROSCOPY KNEE;  Surgeon: Nita Sells, MD;  Location: [place];  Service: Orthopedics;  Laterality: Left;  . LEFT HEART CATH AND CORS/GRAFTS ANGIOGRAPHY N/A 12/17/2016   Procedure: LEFT HEART CATH AND CORS/GRAFTS ANGIOGRAPHY;  Surgeon: Lorretta Harp, MD;  Location: [place] [facility];  Service: Cardiovascular;  Laterality: N/A;  . LEFT HEART CATHETERIZATION WITH CORONARY ANGIOGRAM N/A 02/03/2013   Procedure: LEFT HEART CATHETERIZATION WITH CORONARY  ANGIOGRAM;  Surgeon: Lorretta Harp, MD;  Location: [facility] C[facility];  Service: Cardiovascular;  Laterality: N/A;  . NASAL SEPTUM SURGERY    . RADIAL ARTERY HARVEST Left 02/05/2013   Procedure: RADIAL ARTERY HARVEST;  Surgeon: Ivin Poot, MD;  Location: [place];  Service: Vascular;  Laterality: Left;  . TIBIA BONE BIOPSY  x 3   left  . TONSILLECTOMY      FAMHx:  Family History  Adopted: Yes    SOCHx:   reports that he has never smoked. He has never used smokeless tobacco. He reports that he does not drink alcohol or use drugs.  ALLERGIES:  Allergies  Allergen Reactions  . Reglan [Metoclopramide] Other (See Comments)    Only can tolerate in low doses, in higher doses it has the opposite effect    ROS: Pertinent items noted in HPI and remainder of comprehensive ROS otherwise negative.  HOME MEDS:  Current Outpatient Medications on File Prior to Visit  Medication Sig Dispense Refill  . aspirin 81 MG chewable tablet Chew 1 tablet (81 mg total) by mouth daily.    . clopidogrel (PLAVIX) 75 MG tablet TAKE 1 TABLET (75 MG TOTAL) BY MOUTH DAILY WITH BREAKFAST. 90 tablet 3  . FARXIGA 5 MG TABS tablet Take 5 mg by mouth daily.  11  . fenofibrate 160 MG tablet Take 160 mg by mouth daily.     . furosemide (LASIX) 40 MG tablet Take 1 tablet (40 mg total) by mouth daily. 90 tablet 0  . Insulin Human (INSULIN PUMP) SOLN Inject into the skin.    Marland Kitchen insulin regular human CONCENTRATED (HUMULIN R) 500 UNIT/ML SOLN injection Inject into the skin continuous. Using insulin pump    . isosorbide mononitrate (IMDUR) 30 MG 24 hr tablet Take 0.5 tablets (15 mg total) by mouth daily. (Patient taking differently: Take 15 mg by mouth daily as needed (chest pain). ) 45 tablet 3  . metFORMIN (GLUCOPHAGE) 1000 MG tablet Take 1,000 mg by mouth 2 (two) times daily with a meal.    . metoprolol succinate (TOPROL-XL) 50 MG 24 hr tablet TAKE 1 TABLET EVERY DAY WITH OR IMMEDIATELY FOLLOWING A MEAL (Patient taking  differently: Take 50 mg by mouth 2 (two) times daily. ) 90 tablet 2  . nitroGLYCERIN (NITROSTAT) 0.4 MG SL tablet Place 1 tablet (0.4 mg total) under the tongue every 5 (five) minutes as needed. 25 tablet 2  . ramipril (ALTACE) 5 MG capsule Take 5 mg by mouth daily.  4  . simvastatin (ZOCOR) 10 MG tablet TAKE 1 TABLET BY MOUTH EVERY DAY 90 tablet 2  . VASCEPA 1 g CAPS TAKE 2 CAPSULES TWICE A DAY 360 capsule 4   No current facility-administered medications on file prior to visit.     LABS/IMAGING: No results found for this or any previous visit (from the past 48 hour(s)). No results found.  LIPID PANEL:    Component Value Date/Time   CHOL 515 (H) 05/29/2018 1123   TRIG 5,975 (HH) 05/29/2018 1123   HDL 3 (L) 05/29/2018 1123   CHOLHDL 171.7 (H) 05/29/2018 1123   CHOLHDL 14.2 06/17/2011 0400   VLDL UNABLE TO CALCULATE IF TRIGLYCERIDE OVER 400 mg/dL 06/17/2011 0400   LDLCALC Comment 05/29/2018 1123   LDLDIRECT 6 05/29/2018 1123    WEIGHTS: Wt Readings from Last 3 Encounters:  12/29/18 235 lb 9.6 oz (106.9 kg)  08/19/18 228 lb (103.4 kg)  06/02/18 230 lb 6.4 oz (104.5 kg)    VITALS: BP (!) 153/88   Pulse 91   Ht 5' 10.5" (1.791 m)   Wt 235 lb 9.6 oz (106.9 kg)   SpO2 95%   BMI 33.33 kg/m   EXAM: General appearance: alert and no distress Neck: no carotid bruit, no JVD and thyroid not enlarged, symmetric, no tenderness/mass/nodules Lungs: clear to auscultation bilaterally Heart: regular rate and rhythm, S1, S2 normal, no murmur, click, rub or gallop Abdomen: soft, non-tender; bowel sounds normal; no masses,  no organomegaly Extremities: multiple cholesterol xanthomas on the elbows, knees and feet Pulses: 2+ and symmetric Skin: multiple xanthomas Neurologic: Grossly normal Psych: Pleasant  EKG: Deferred  ASSESSMENT: 1. Hyperchylomicronemia syndrome 2. ASCVD with prior four-vessel CABG and PCI 3. Insulin-dependent diabetes 4. Hypertension  PLAN: 1.   Juan [name]  has persistently elevated triglycerides now on the 3600s.  At its worst it was over 15,000.  He has not had a recent episode  of pancreatitis.  He is concerned about whether he may require repeat CABG however I reassured him today that is unlikely.  We have multiple techniques now to potentially stent bypass grafts or treat chronic total occlusions.  Will continue to monitor for clinical trial options in the area which he may be a candidate for - particularly the ApoC3 inhibitiors.  Follow-up with me in 6 months.  Pixie Casino, MD, [facility], [place] Director of the Advanced Lipid Disorders &  Cardiovascular Risk Reduction Clinic Diplomate of the American Board of Clinical Lipidology Attending Cardiologist  Direct Dial: [phone]  Fax: [phone]  Website:  [email or web address] 12/29/2018, 9:20 AM

## 2018-12-29 NOTE — Patient Instructions (Addendum)
Medication Instructions:  Dr Debara Pickett recommends that you continue on your current medications as directed. Please refer to the Current Medication list given to you today.  If you need a refill on your cardiac medications before your next appointment, please call your pharmacy.   Follow-Up: At [facility], you and your health needs are our priority.  As part of our continuing mission to provide you with exceptional heart care, we have created designated Provider Care Teams.  These Care Teams include your primary Cardiologist (physician) and Advanced Practice Providers (APPs -  Physician Assistants and Nurse Practitioners) who all work together to provide you with the care you need, when you need it. You will need a follow up appointment in 6 months.  Please call our office 2 months in advance to schedule this appointment.  You may see Pixie Casino, MD or one of the following Advanced Practice Providers on your designated Care Team: [place], [place] . Fabian Sharp, PA-C . You will receive a reminder letter in the mail two months in advance. If you don't receive a letter, please call our office to schedule the follow-up appointment.

## 2019-01-06 ENCOUNTER — Other Ambulatory Visit: Payer: Self-pay | Admitting: Cardiovascular Disease

## 2019-02-16 ENCOUNTER — Encounter (INDEPENDENT_AMBULATORY_CARE_PROVIDER_SITE_OTHER): Payer: Commercial Managed Care - PPO | Admitting: Ophthalmology

## 2019-02-19 NOTE — Progress Notes (Signed)
[facility]  02/24/2019     CHIEF COMPLAINT Patient presents for Diabetic Eye Exam   HISTORY OF PRESENT ILLNESS: Juan [name] is a 53 y.o. male who presents to the clinic today for:   HPI    Diabetic Eye Exam    Vision is stable.  Diabetes characteristics include Type 2.  Last Blood Glucose 186.  Last A1C 7.  I, the attending physician,  performed the HPI with the patient and updated documentation appropriately.          Comments    Last BS: 186 last night Last A1c: 7.0 Pt states his vision is stable in both eyes.  He has a lot of irritation in both eyes currently.  He denies eye pain.  Patient denies any new or worsening floaters or fol OU.       Last edited by Bernarda Caffey, MD on 02/24/2019  8:10 AM. (History)     pt states states he has not had his triglycerides checked since he was here last, he states his cardiologist is trying to get him into some clinical trails    Referring physician: [name], [place] [place],  [place] [address]  HISTORICAL INFORMATION:   Selected notes from the [facility] Referred by Dr. [name] for DM exam    CURRENT MEDICATIONS: No current outpatient medications on file. (Ophthalmic Drugs)   No current facility-administered medications for this visit.  (Ophthalmic Drugs)   Current Outpatient Medications (Other)  Medication Sig  . aspirin 81 MG chewable tablet Chew 1 tablet (81 mg total) by mouth daily.  . clopidogrel (PLAVIX) 75 MG tablet TAKE 1 TABLET (75 MG TOTAL) BY MOUTH DAILY WITH BREAKFAST.  Marland Kitchen FARXIGA 5 MG TABS tablet Take 5 mg by mouth daily.  . fenofibrate 160 MG tablet Take 160 mg by mouth daily.   . furosemide (LASIX) 40 MG tablet TAKE 1 TABLET BY MOUTH EVERY DAY  . Insulin Human (INSULIN PUMP) SOLN Inject into the skin.  Marland Kitchen insulin regular human CONCENTRATED (HUMULIN R) 500 UNIT/ML SOLN injection Inject into the skin continuous. Using insulin pump  .  isosorbide mononitrate (IMDUR) 30 MG 24 hr tablet Take 0.5 tablets (15 mg total) by mouth daily. (Patient taking differently: Take 15 mg by mouth daily as needed (chest pain). )  . metFORMIN (GLUCOPHAGE) 1000 MG tablet Take 1,000 mg by mouth 2 (two) times daily with a meal.  . metoprolol succinate (TOPROL-XL) 50 MG 24 hr tablet TAKE 1 TABLET EVERY DAY WITH OR IMMEDIATELY FOLLOWING A MEAL (Patient taking differently: Take 50 mg by mouth 2 (two) times daily. )  . nitroGLYCERIN (NITROSTAT) 0.4 MG SL tablet Place 1 tablet (0.4 mg total) under the tongue every 5 (five) minutes as needed.  . ramipril (ALTACE) 5 MG capsule Take 5 mg by mouth daily.  . simvastatin (ZOCOR) 10 MG tablet TAKE 1 TABLET BY MOUTH EVERY DAY  . VASCEPA 1 g CAPS TAKE 2 CAPSULES TWICE A DAY   No current facility-administered medications for this visit.  (Other)      REVIEW OF SYSTEMS: ROS    Positive for: Genitourinary, Endocrine, Eyes   Negative for: Constitutional, Gastrointestinal, Neurological, Skin, Musculoskeletal, HENT, Cardiovascular, Respiratory, Psychiatric, Allergic/Imm, Heme/Lymph   Last edited by Doneen Poisson on 02/24/2019  7:43 AM. (History)    pt states there has been no change in his vision, pt states [name] is his primary ophthalmologist  ALLERGIES Allergies  Allergen Reactions  . Reglan [Metoclopramide] Other (See Comments)    Only can tolerate in low doses, in higher doses it has the opposite effect    PAST MEDICAL HISTORY Past Medical History:  Diagnosis Date  . Abnormal cardiovascular stress test 02/06/2013  . Anemia    occasional - no problems currently(10/02/2011)  . Bilateral renal cysts 06/16/2011   states no known problems  . Blood transfusion without reported diagnosis   . CAD (coronary artery disease), LAD 90%, 1st diag 95%, LCX 70% wth AV groove 90%, RCA 40-50% mid and 80% long distal stenosis 02/03/13 02/04/2013  . Chest pain    positive Myoview stress test  . Coronary  artery calcification seen on CAT scan   . Diabetes mellitus    IDDM  . Fatty liver disease, nonalcoholic [id number]  . Hyperlipidemia   . Hypertension    states no dx. of HTN, takes med. to protect kidneys due to DM  . Lateral meniscus tear 09/2011   left  . Loose body in knee 09/2011   loose bodies left knee  . Non Hodgkin's lymphoma ([facility]) 1991  . Pancreatitis    occasional - last episode 06/2011  . S/P CABG x 4, 02/05/13 LIMA-LAD; LT. RADIAL-OM;VG-DIAG; VG-PDA 02/06/2013   10/18 3/4 patent grafts (occluded SVG-->Diag), PCI/DESx 3 SVG-->RCA, normal EF  . Splenomegaly, congestive, chronic   . Stuffy and runny nose 10/02/2011   yellow drainage from nose   Past Surgical History:  Procedure Laterality Date  . ABDOMINAL AORTIC ANEURYSM REPAIR    . ANTERIOR CERVICAL DECOMP/DISCECTOMY FUSION N/A 03/26/2018   Procedure: ANTERIOR CERVICAL DECOMPRESSION FUSION CERVICAL 5-6 WITH INSTRUMENTATION AND ALLOGRAFT;  Surgeon: Phylliss Bob, MD;  Location: [facility];  Service: Orthopedics;  Laterality: N/A;  . CORONARY ARTERY BYPASS GRAFT N/A 02/05/2013   Procedure: CORONARY ARTERY BYPASS GRAFTING (CABG);  Surgeon: Ivin Poot, MD;  Location: [facility];  Service: Open Heart Surgery;  Laterality: N/A;  Coronary artery bypass graft on pump times four using left internal mammary artery and right greater saphenous vein via endovein harvest and left radial artery harvest.   . CORONARY STENT INTERVENTION  12/17/2016    PCI and drug-eluting stenting of the mid and distal RCA SVG   . CORONARY STENT INTERVENTION N/A 12/17/2016   Procedure: CORONARY STENT INTERVENTION;  Surgeon: Lorretta Harp, MD;  Location: [facility];  Service: Cardiovascular;  Laterality: N/A;  . ELBOW SURGERY    . HERNIA REPAIR     inguinal   . herniated disc    . INTRAOPERATIVE TRANSESOPHAGEAL ECHOCARDIOGRAM N/A 02/05/2013   Procedure: INTRAOPERATIVE TRANSESOPHAGEAL ECHOCARDIOGRAM;  Surgeon: Ivin Poot, MD;  Location: [facility];   Service: Open Heart Surgery;  Laterality: N/A;  . KNEE ARTHROSCOPY  10/09/2011   Procedure: ARTHROSCOPY KNEE;  Surgeon: Nita Sells, MD;  Location: [facility];  Service: Orthopedics;  Laterality: Left;  . LEFT HEART CATH AND CORS/GRAFTS ANGIOGRAPHY N/A 12/17/2016   Procedure: LEFT HEART CATH AND CORS/GRAFTS ANGIOGRAPHY;  Surgeon: Lorretta Harp, MD;  Location: [facility];  Service: Cardiovascular;  Laterality: N/A;  . LEFT HEART CATHETERIZATION WITH CORONARY ANGIOGRAM N/A 02/03/2013   Procedure: LEFT HEART CATHETERIZATION WITH CORONARY ANGIOGRAM;  Surgeon: Lorretta Harp, MD;  Location: [facility];  Service: Cardiovascular;  Laterality: N/A;  . NASAL SEPTUM SURGERY    . RADIAL ARTERY HARVEST Left 02/05/2013   Procedure: RADIAL ARTERY HARVEST;  Surgeon: Ivin Poot, MD;  Location: [facility];  Service: Vascular;  Laterality: Left;  . TIBIA BONE BIOPSY  x 3   left  . TONSILLECTOMY      FAMILY HISTORY Family History  Adopted: Yes    SOCIAL HISTORY Social History   Tobacco Use  . Smoking status: Never Smoker  . Smokeless tobacco: Never Used  Substance Use Topics  . Alcohol use: No  . Drug use: No         OPHTHALMIC EXAM:  Base Eye Exam    Visual Acuity (Snellen - Linear)      Right Left   Dist cc 20/20 -2 20/20 -1   Correction: Glasses       Tonometry (Tonopen, 7:47 AM)      Right Left   Pressure 14 14       Pupils      Dark Light Shape React APD   Right 3 2 Round Brisk 0   Left 3 2 Round Brisk 0       Visual Fields      Left Right    Full Full       Extraocular Movement      Right Left    Full Full       Neuro/Psych    Oriented x3: Yes   Mood/Affect: Normal       Dilation    Both eyes: 1.0% Mydriacyl, 2.5% Phenylephrine @ 7:47 AM        Slit Lamp and Fundus Exam    Slit Lamp Exam      Right Left   Lids/Lashes Normal Normal   Conjunctiva/Sclera White and quiet White and quiet   Cornea Inferior-temporal K  scar, 1-2+ Punctate epithelial erosions, mild Arcus, focal corneal haze IT quad 1+ inferior Punctate epithelial erosions, mild Arcus, scattered peripheral areas of corneal haze / mild scarring/Salzmann's nodules   Anterior Chamber Deep and quiet Deep and quiet   Iris Round and dilated, No NVI Round and dilated, No NVI   Lens 2+ Nuclear sclerosis, 2+ Cortical cataract 2+ Nuclear sclerosis, 2+ Cortical cataract   Vitreous Vitreous syneresis Vitreous syneresis       Fundus Exam      Right Left   Disc Pink and Sharp, no NVD pink and sharp; No NVD   C/D Ratio 0.3 0.3   Macula Good foveal reflex, mild Retinal pigment epithelial mottling, macroaneurysms along arcades, focal collections of exudate along IT temporal arcades Good foveal reflex, focal flame hemes with exudate and surrounding exudate along ST and temporal arcades, larger patch of exudate along nasal arcades   Vessels Vascular attenuation, Tortuous, mild Copper wiring Vascular attenuation, Tortuous, mild Copper wiring, focal perivascular lipid exudation along superior vessels -- interval increase from prior   Periphery Attached, mild focal patch of flame heme and exudate nasal to disc Attached, scattered IRH; focal IRH w/ mild surrounding exudation--0800 midzone, focal hemorrhage/exudation at 1130 1.5 DD from disc -- increased from prior; new [facility] along proximal ST arcades        Refraction    Wearing Rx      Sphere Cylinder Axis Add   Right -4.00 Sphere  +1.25   Left -3.75 +0.50 060 +1.25          IMAGING AND PROCEDURES  Imaging and Procedures for 05/23/17  OCT, Retina - OU - Both Eyes       Right Eye Quality was good. Central Foveal Thickness: 281. Progression has been stable. Findings include normal foveal contour, no IRF,  no SRF, vitreomacular adhesion .   Left Eye Quality was good. Central Foveal Thickness: 296. Progression has been stable. Findings include normal foveal contour, no IRF, no SRF, vitreomacular adhesion ,  intraretinal hyper-reflective material (Focal areas of IRHM superior and nasal to disc and along temporal arcades -- mild interval increase).   Notes *Images captured and stored on drive  Diagnosis / Impression:  NFP, No IRF/SRF VMA, focal area of IRHM OS -- Focal areas of IRHM superior and nasal to disc and along temporal arcades -- mild interval increase  Clinical management:  See below  Abbreviations: NFP - Normal foveal profile. CME - cystoid macular edema. PED - pigment epithelial detachment. IRF - intraretinal fluid. SRF - subretinal fluid. EZ - ellipsoid zone. ERM - epiretinal membrane. ORA - outer retinal atrophy. ORT - outer retinal tubulation. SRHM - subretinal hyper-reflective material         Color Fundus Photography Optos - OU - Both Eyes       Right Eye Progression has been stable. Disc findings include normal observations. Macula : exudates, normal observations (Tr exudates). Vessels : tortuous vessels, attenuated. Periphery : exudates, hemorrhage (Mild scattered IRH and exudates).   Left Eye Progression has worsened. Disc findings include normal observations. Macula : exudates. Vessels : attenuated, tortuous vessels (+copper wiring). Periphery : exudates, hemorrhage.   Notes **Images stored on drive**  Impression: Focal patches of IRH and exudate OU (OS >>> OD)  OS: interval increase in heme and exudation superior and nasal to disc and along temporal arcades                 ASSESSMENT/PLAN:    ICD-10-CM   1. Diabetic retinal microaneurysm (HCC)  E11.319 Color Fundus Photography Optos - OU - Both Eyes   H35.049   2. Moderate nonproliferative diabetic retinopathy of both eyes without macular edema associated with type 2 diabetes mellitus ([facility])  [id number]   3. Retinal edema  H35.81 OCT, Retina - OU - Both Eyes  4. Essential hypertension  I10   5. Hypertensive retinopathy of both eyes  H35.033   6. Dyslipidemia  E78.5   7. Combined forms of age-related  cataract of both eyes  H25.813     1-3. DMII w/ nonproliferative diabetic retinopathy and noncentral diabetic retinal edema, both eyes  - h/o focal areas of hemorrhage and exudates OU -- prior exams with progressively worsening focal hemorrhage and exudates  - exam today shows striking interval worsening of copper wiring of arterial vessels -- likely related to uncontrolled hypertriglyceridemia and hyperchylomicronemia  - prior FAs show non central, focal areas of late leaking MA with surrounding capillary nonperfusion  - OCT without central diabetic macular edema, both eyes, but focal areas of retinal edema / thickening corresponding to peripheral / noncentral areas of hemorrhage and exudation  - pt reports history of CAD with stents placed Oct 2018, currently on plavix for 1 year -- possibly contributing to pathology.  - S/P focal laser OS (09.16.19)  - good initial response but now lesions have persistent hemorrhage and exudation OU -- mild progressive worsening on serial color fundus photos  - BCVA remains stable-- 20/20 OU  - discussed findings and possible treatment option of intravitreal anti-VEGF therapy -- pt declines at this time  - recommend referral to [facility] for second opinion regarding exudative retinopathy related to dyslipidemia / hypertriglyceridemia OU  - f/u in 3 months with wide field OCT and color fundus photos  4-6.  Hypertensive retinopathy and dyslipidemia / hypertriglyceridemia OU  - discussed importance of tight BP control  - BP likely contributing to retinal vascular pathology above  - significant worsening of arterial vessel appearance OU -- increased copper wiring and perivascular exudation -- likely related to uncontrolled hypertriglyceridemia  - per Cardiology notes and patient, trying to get pt enrolled in a clinic trial for dyslipidemia / hypertriglyceridemia  - monitor for now  7. Combined form age-related cataract OU-   - The  symptoms of cataract, surgical options, and treatments and risks were discussed with patient.  - discussed diagnosis and progression  - not yet visually significant  - monitor for now   Ophthalmic Meds Ordered this visit:  No orders of the defined types were placed in this encounter.      Return in about 4 months (around 06/25/2019) for f/u DM / noncentral retinal edema OU, DFE, OCT.  There are no Patient Instructions on file for this visit.   Explained the diagnoses, plan, and follow up with the patient and they expressed understanding.  Patient expressed understanding of the importance of proper follow up care.   This document serves as a record of services personally performed by Gardiner Sleeper, MD, PhD. It was created on their behalf by Estill Bakes, COT an ophthalmic technician. The creation of this record is the provider's dictation and/or activities during the visit.    Electronically signed by: Estill Bakes, COT 02/19/19 @ 1:15 PM  Gardiner Sleeper, M.D., Ph.D. Diseases & Surgery of the Retina and [place] 02/24/2019   I have reviewed the above documentation for accuracy and completeness, and I agree with the above. Gardiner Sleeper, M.D., Ph.D. 02/25/19 1:19 PM   Abbreviations: M myopia (nearsighted); A astigmatism; H hyperopia (farsighted); P presbyopia; Mrx spectacle prescription;  CTL contact lenses; OD right eye; OS left eye; OU both eyes  XT exotropia; ET esotropia; PEK punctate epithelial keratitis; PEE punctate epithelial erosions; DES dry eye syndrome; MGD meibomian gland dysfunction; ATs artificial tears; PFAT's preservative free artificial tears; [place] nuclear sclerotic cataract; PSC posterior subcapsular cataract; ERM epi-retinal membrane; PVD posterior vitreous detachment; RD retinal detachment; DM diabetes mellitus; DR diabetic retinopathy; NPDR non-proliferative diabetic retinopathy; PDR proliferative diabetic retinopathy; CSME  clinically significant macular edema; DME diabetic macular edema; dbh dot blot hemorrhages; CWS cotton wool spot; POAG primary open angle glaucoma; C/D cup-to-disc ratio; HVF humphrey visual field; GVF goldmann visual field; OCT optical coherence tomography; IOP intraocular pressure; BRVO Branch retinal vein occlusion; CRVO central retinal vein occlusion; CRAO central retinal artery occlusion; BRAO branch retinal artery occlusion; RT retinal tear; SB scleral buckle; PPV pars plana vitrectomy; VH Vitreous hemorrhage; PRP panretinal laser photocoagulation; IVK intravitreal kenalog; VMT vitreomacular traction; MH Macular hole;  NVD neovascularization of the disc; NVE neovascularization elsewhere; AREDS age related eye disease study; ARMD age related macular degeneration; POAG primary open angle glaucoma; EBMD epithelial/anterior basement membrane dystrophy; ACIOL anterior chamber intraocular lens; IOL intraocular lens; PCIOL posterior chamber intraocular lens; Phaco/IOL phacoemulsification with intraocular lens placement; [place] photorefractive keratectomy; LASIK laser assisted in situ keratomileusis; HTN hypertension; DM diabetes mellitus; COPD chronic obstructive pulmonary disease

## 2019-02-24 ENCOUNTER — Encounter (INDEPENDENT_AMBULATORY_CARE_PROVIDER_SITE_OTHER): Payer: Self-pay | Admitting: Ophthalmology

## 2019-02-24 ENCOUNTER — Ambulatory Visit (INDEPENDENT_AMBULATORY_CARE_PROVIDER_SITE_OTHER): Payer: Commercial Managed Care - PPO | Admitting: Ophthalmology

## 2019-02-24 DIAGNOSIS — H35049 Retinal micro-aneurysms, unspecified, unspecified eye: Secondary | ICD-10-CM

## 2019-02-24 DIAGNOSIS — E11319 Type 2 diabetes mellitus with unspecified diabetic retinopathy without macular edema: Secondary | ICD-10-CM

## 2019-02-24 DIAGNOSIS — E113393 Type 2 diabetes mellitus with moderate nonproliferative diabetic retinopathy without macular edema, bilateral: Secondary | ICD-10-CM

## 2019-02-24 DIAGNOSIS — E785 Hyperlipidemia, unspecified: Secondary | ICD-10-CM

## 2019-02-24 DIAGNOSIS — H3581 Retinal edema: Secondary | ICD-10-CM | POA: Diagnosis not present

## 2019-02-24 DIAGNOSIS — I10 Essential (primary) hypertension: Secondary | ICD-10-CM

## 2019-02-24 DIAGNOSIS — H25813 Combined forms of age-related cataract, bilateral: Secondary | ICD-10-CM

## 2019-02-24 DIAGNOSIS — H35033 Hypertensive retinopathy, bilateral: Secondary | ICD-10-CM

## 2019-05-15 ENCOUNTER — Other Ambulatory Visit: Payer: Self-pay | Admitting: Student

## 2019-05-21 ENCOUNTER — Inpatient Hospital Stay (HOSPITAL_COMMUNITY)
Admission: EM | Admit: 2019-05-21 | Discharge: 2019-05-25 | DRG: 872 | Disposition: A | Discharge status: Home / Self Care | Payer: Commercial Managed Care - PPO | Attending: Internal Medicine | Admitting: Internal Medicine

## 2019-05-21 ENCOUNTER — Encounter (HOSPITAL_COMMUNITY): Payer: Self-pay

## 2019-05-21 ENCOUNTER — Emergency Department (HOSPITAL_COMMUNITY): Payer: Commercial Managed Care - PPO

## 2019-05-21 ENCOUNTER — Other Ambulatory Visit: Payer: Self-pay

## 2019-05-21 ENCOUNTER — Inpatient Hospital Stay (HOSPITAL_COMMUNITY)
Admit: 2019-05-21 | Discharge: 2019-05-21 | Disposition: A | Discharge status: Home / Self Care | Payer: Commercial Managed Care - PPO | Attending: Internal Medicine | Admitting: Internal Medicine

## 2019-05-21 DIAGNOSIS — E1165 Type 2 diabetes mellitus with hyperglycemia: Secondary | ICD-10-CM | POA: Diagnosis present

## 2019-05-21 DIAGNOSIS — I5040 Unspecified combined systolic (congestive) and diastolic (congestive) heart failure: Secondary | ICD-10-CM | POA: Diagnosis not present

## 2019-05-21 DIAGNOSIS — I5032 Chronic diastolic (congestive) heart failure: Secondary | ICD-10-CM | POA: Diagnosis not present

## 2019-05-21 DIAGNOSIS — Z794 Long term (current) use of insulin: Secondary | ICD-10-CM

## 2019-05-21 DIAGNOSIS — M879 Osteonecrosis, unspecified: Secondary | ICD-10-CM | POA: Diagnosis not present

## 2019-05-21 DIAGNOSIS — N179 Acute kidney failure, unspecified: Secondary | ICD-10-CM | POA: Diagnosis not present

## 2019-05-21 DIAGNOSIS — I248 Other forms of acute ischemic heart disease: Secondary | ICD-10-CM | POA: Diagnosis present

## 2019-05-21 DIAGNOSIS — E781 Pure hyperglyceridemia: Secondary | ICD-10-CM | POA: Diagnosis present

## 2019-05-21 DIAGNOSIS — Z8572 Personal history of non-Hodgkin lymphomas: Secondary | ICD-10-CM

## 2019-05-21 DIAGNOSIS — R718 Other abnormality of red blood cells: Secondary | ICD-10-CM | POA: Diagnosis present

## 2019-05-21 DIAGNOSIS — Z7902 Long term (current) use of antithrombotics/antiplatelets: Secondary | ICD-10-CM | POA: Diagnosis not present

## 2019-05-21 DIAGNOSIS — I110 Hypertensive heart disease with heart failure: Secondary | ICD-10-CM | POA: Diagnosis present

## 2019-05-21 DIAGNOSIS — Z955 Presence of coronary angioplasty implant and graft: Secondary | ICD-10-CM

## 2019-05-21 DIAGNOSIS — Z79899 Other long term (current) drug therapy: Secondary | ICD-10-CM | POA: Diagnosis not present

## 2019-05-21 DIAGNOSIS — K760 Fatty (change of) liver, not elsewhere classified: Secondary | ICD-10-CM | POA: Diagnosis present

## 2019-05-21 DIAGNOSIS — E871 Hypo-osmolality and hyponatremia: Secondary | ICD-10-CM | POA: Diagnosis not present

## 2019-05-21 DIAGNOSIS — I10 Essential (primary) hypertension: Secondary | ICD-10-CM | POA: Diagnosis not present

## 2019-05-21 DIAGNOSIS — I82401 Acute embolism and thrombosis of unspecified deep veins of right lower extremity: Secondary | ICD-10-CM | POA: Diagnosis not present

## 2019-05-21 DIAGNOSIS — Z888 Allergy status to other drugs, medicaments and biological substances status: Secondary | ICD-10-CM

## 2019-05-21 DIAGNOSIS — L03115 Cellulitis of right lower limb: Secondary | ICD-10-CM | POA: Diagnosis not present

## 2019-05-21 DIAGNOSIS — D696 Thrombocytopenia, unspecified: Secondary | ICD-10-CM | POA: Diagnosis present

## 2019-05-21 DIAGNOSIS — E785 Hyperlipidemia, unspecified: Secondary | ICD-10-CM | POA: Diagnosis present

## 2019-05-21 DIAGNOSIS — M609 Myositis, unspecified: Secondary | ICD-10-CM | POA: Diagnosis present

## 2019-05-21 DIAGNOSIS — E875 Hyperkalemia: Secondary | ICD-10-CM | POA: Diagnosis not present

## 2019-05-21 DIAGNOSIS — Z951 Presence of aortocoronary bypass graft: Secondary | ICD-10-CM

## 2019-05-21 DIAGNOSIS — R778 Other specified abnormalities of plasma proteins: Secondary | ICD-10-CM

## 2019-05-21 DIAGNOSIS — R197 Diarrhea, unspecified: Secondary | ICD-10-CM | POA: Diagnosis present

## 2019-05-21 DIAGNOSIS — R6520 Severe sepsis without septic shock: Secondary | ICD-10-CM | POA: Diagnosis not present

## 2019-05-21 DIAGNOSIS — Z7982 Long term (current) use of aspirin: Secondary | ICD-10-CM

## 2019-05-21 DIAGNOSIS — E872 Acidosis, unspecified: Secondary | ICD-10-CM | POA: Diagnosis present

## 2019-05-21 DIAGNOSIS — Z20822 Contact with and (suspected) exposure to covid-19: Secondary | ICD-10-CM | POA: Diagnosis present

## 2019-05-21 DIAGNOSIS — Z9641 Presence of insulin pump (external) (internal): Secondary | ICD-10-CM | POA: Diagnosis present

## 2019-05-21 DIAGNOSIS — A419 Sepsis, unspecified organism: Principal | ICD-10-CM | POA: Diagnosis present

## 2019-05-21 DIAGNOSIS — R7989 Other specified abnormal findings of blood chemistry: Secondary | ICD-10-CM | POA: Diagnosis present

## 2019-05-21 DIAGNOSIS — I2510 Atherosclerotic heart disease of native coronary artery without angina pectoris: Secondary | ICD-10-CM | POA: Diagnosis not present

## 2019-05-21 DIAGNOSIS — R9431 Abnormal electrocardiogram [ECG] [EKG]: Secondary | ICD-10-CM

## 2019-05-21 LAB — CBC
HCT: 36.6 % — ABNORMAL LOW (ref 39.0–52.0)
Hemoglobin: 11.9 g/dL — ABNORMAL LOW (ref 13.0–17.0)
MCH: 25.9 pg — ABNORMAL LOW (ref 26.0–34.0)
MCHC: 32.5 g/dL (ref 30.0–36.0)
MCV: 79.6 fL — ABNORMAL LOW (ref 80.0–100.0)
Platelets: 87 10*3/uL — ABNORMAL LOW (ref 150–400)
RBC: 4.6 MIL/uL (ref 4.22–5.81)
RDW: 16.9 % — ABNORMAL HIGH (ref 11.5–15.5)
WBC: 13.2 10*3/uL — ABNORMAL HIGH (ref 4.0–10.5)
nRBC: 0 % (ref 0.0–0.2)

## 2019-05-21 LAB — CBC WITH DIFFERENTIAL/PLATELET
Abs Immature Granulocytes: 0.34 10*3/uL — ABNORMAL HIGH (ref 0.00–0.07)
Basophils Absolute: 0.1 10*3/uL (ref 0.0–0.1)
Basophils Relative: 1 %
Eosinophils Absolute: 0 10*3/uL (ref 0.0–0.5)
Eosinophils Relative: 0 %
HCT: 40.8 % (ref 39.0–52.0)
Hemoglobin: 13.5 g/dL (ref 13.0–17.0)
Immature Granulocytes: 2 %
Lymphocytes Relative: 5 %
Lymphs Abs: 0.7 10*3/uL (ref 0.7–4.0)
MCH: 26 pg (ref 26.0–34.0)
MCHC: 33.1 g/dL (ref 30.0–36.0)
MCV: 78.6 fL — ABNORMAL LOW (ref 80.0–100.0)
Monocytes Absolute: 0.9 10*3/uL (ref 0.1–1.0)
Monocytes Relative: 5 %
Neutro Abs: 13.8 10*3/uL — ABNORMAL HIGH (ref 1.7–7.7)
Neutrophils Relative %: 87 %
Platelets: 123 10*3/uL — ABNORMAL LOW (ref 150–400)
RBC: 5.19 MIL/uL (ref 4.22–5.81)
RDW: 16.7 % — ABNORMAL HIGH (ref 11.5–15.5)
WBC: 15.9 10*3/uL — ABNORMAL HIGH (ref 4.0–10.5)
nRBC: 0 % (ref 0.0–0.2)

## 2019-05-21 LAB — URINALYSIS, ROUTINE W REFLEX MICROSCOPIC
Bilirubin Urine: NEGATIVE
Glucose, UA: 50 mg/dL — AB
Ketones, ur: NEGATIVE mg/dL
Leukocytes,Ua: NEGATIVE
Nitrite: NEGATIVE
Protein, ur: >=300 mg/dL — AB
Specific Gravity, Urine: 1.022 (ref 1.005–1.030)
pH: 5 (ref 5.0–8.0)

## 2019-05-21 LAB — COMPREHENSIVE METABOLIC PANEL
ALT: 43 U/L (ref 0–44)
AST: 46 U/L — ABNORMAL HIGH (ref 15–41)
Albumin: 3.3 g/dL — ABNORMAL LOW (ref 3.5–5.0)
Alkaline Phosphatase: 50 U/L (ref 38–126)
Anion gap: 11 (ref 5–15)
BUN: 28 mg/dL — ABNORMAL HIGH (ref 6–20)
CO2: 21 mmol/L — ABNORMAL LOW (ref 22–32)
Calcium: 9.9 mg/dL (ref 8.9–10.3)
Chloride: 100 mmol/L (ref 98–111)
Creatinine, Ser: 2.31 mg/dL — ABNORMAL HIGH (ref 0.61–1.24)
GFR calc Af Amer: 36 mL/min — ABNORMAL LOW (ref >60)
GFR calc non Af Amer: 31 mL/min — ABNORMAL LOW (ref >60)
Glucose, Bld: 145 mg/dL — ABNORMAL HIGH (ref 70–99)
Potassium: 4.8 mmol/L (ref 3.5–5.1)
Sodium: 132 mmol/L — ABNORMAL LOW (ref 135–145)
Total Bilirubin: 1.2 mg/dL (ref 0.3–1.2)
Total Protein: 6.6 g/dL (ref 6.5–8.1)

## 2019-05-21 LAB — FERRITIN: Ferritin: 189 ng/mL (ref 24–336)

## 2019-05-21 LAB — CREATININE, SERUM
Creatinine, Ser: 2.54 mg/dL — ABNORMAL HIGH (ref 0.61–1.24)
GFR calc Af Amer: 32 mL/min — ABNORMAL LOW (ref >60)
GFR calc non Af Amer: 28 mL/min — ABNORMAL LOW (ref >60)

## 2019-05-21 LAB — IRON AND TIBC
Iron: 24 ug/dL — ABNORMAL LOW (ref 45–182)
Saturation Ratios: 8 % — ABNORMAL LOW (ref 17.9–39.5)
TIBC: 286 ug/dL (ref 250–450)
UIBC: 262 ug/dL

## 2019-05-21 LAB — LACTIC ACID, PLASMA
Lactic Acid, Venous: 4.1 mmol/L (ref 0.5–1.9)
Lactic Acid, Venous: 3.7 mmol/L (ref 0.5–1.9)

## 2019-05-21 LAB — GLUCOSE, CAPILLARY: Glucose-Capillary: 180 mg/dL — ABNORMAL HIGH (ref 70–99)

## 2019-05-21 LAB — CBG MONITORING, ED: Glucose-Capillary: 124 mg/dL — ABNORMAL HIGH (ref 70–99)

## 2019-05-21 LAB — PROTIME-INR
INR: 1 (ref 0.8–1.2)
Prothrombin Time: 13.5 seconds (ref 11.4–15.2)

## 2019-05-21 LAB — RESPIRATORY PANEL BY RT PCR (FLU A&B, COVID)
Influenza A by PCR: NEGATIVE
Influenza B by PCR: NEGATIVE
SARS Coronavirus 2 by RT PCR: NEGATIVE

## 2019-05-21 LAB — TROPONIN I (HIGH SENSITIVITY)
Troponin I (High Sensitivity): 4494 ng/L (ref <18)
Troponin I (High Sensitivity): 4814 ng/L (ref <18)

## 2019-05-21 LAB — HEMOGLOBIN A1C
Hgb A1c MFr Bld: 8.5 % — ABNORMAL HIGH (ref 4.8–5.6)
Mean Plasma Glucose: 197.25 mg/dL

## 2019-05-21 LAB — MAGNESIUM: Magnesium: 1.7 mg/dL (ref 1.7–2.4)

## 2019-05-21 LAB — HIV ANTIBODY (ROUTINE TESTING W REFLEX): HIV Screen 4th Generation wRfx: NONREACTIVE

## 2019-05-21 MED ORDER — ICOSAPENT ETHYL 1 G PO CAPS
2.0000 g | ORAL_CAPSULE | Freq: Two times a day (BID) | ORAL | Status: DC
  Administered 2019-05-21 – 2019-05-25 (×8): 2 g via ORAL
  Filled 2019-05-21 (×9): qty 2

## 2019-05-21 MED ORDER — SODIUM CHLORIDE 0.9 % IV BOLUS: 1000.0000 mL | Freq: Once | INTRAVENOUS | Status: DC

## 2019-05-21 MED ORDER — SODIUM CHLORIDE 0.9 % IV SOLN
INTRAVENOUS | Status: DC
  Administered 2019-05-21: 1000 mL via INTRAVENOUS

## 2019-05-21 MED ORDER — FENOFIBRATE 160 MG PO TABS
160.0000 mg | ORAL_TABLET | Freq: Every day | ORAL | Status: DC
  Administered 2019-05-22 – 2019-05-25 (×4): 160 mg via ORAL
  Filled 2019-05-21 (×4): qty 1

## 2019-05-21 MED ORDER — ACETAMINOPHEN 650 MG RE SUPP: 650.0000 mg | Freq: Four times a day (QID) | RECTAL | Status: DC | PRN

## 2019-05-21 MED ORDER — SODIUM CHLORIDE 0.9 % IV SOLN
2.0000 g | INTRAVENOUS | Status: DC
  Administered 2019-05-21: 2 g via INTRAVENOUS
  Filled 2019-05-21: qty 20

## 2019-05-21 MED ORDER — INSULIN PUMP
Freq: Three times a day (TID) | SUBCUTANEOUS | Status: DC
  Administered 2019-05-24: 6.3 via SUBCUTANEOUS
  Administered 2019-05-24 (×2): 0.4 via SUBCUTANEOUS
  Filled 2019-05-21: qty 1

## 2019-05-21 MED ORDER — CLOPIDOGREL BISULFATE 75 MG PO TABS
75.0000 mg | ORAL_TABLET | Freq: Every day | ORAL | Status: DC
  Administered 2019-05-22 – 2019-05-25 (×4): 75 mg via ORAL
  Filled 2019-05-21 (×4): qty 1

## 2019-05-21 MED ORDER — ACETAMINOPHEN 325 MG PO TABS
650.0000 mg | ORAL_TABLET | Freq: Four times a day (QID) | ORAL | Status: DC | PRN
  Administered 2019-05-21: 650 mg via ORAL
  Filled 2019-05-21: qty 2

## 2019-05-21 MED ORDER — SIMVASTATIN 20 MG PO TABS
10.0000 mg | ORAL_TABLET | Freq: Every day | ORAL | Status: DC
  Administered 2019-05-21 – 2019-05-23 (×3): 10 mg via ORAL
  Filled 2019-05-21 (×4): qty 1

## 2019-05-21 MED ORDER — SODIUM CHLORIDE 0.9 % IV BOLUS (SEPSIS)
1000.0000 mL | Freq: Once | INTRAVENOUS | Status: AC
  Administered 2019-05-21: 1000 mL via INTRAVENOUS

## 2019-05-21 MED ORDER — SODIUM CHLORIDE 0.9 % IV SOLN
2.0000 g | Freq: Two times a day (BID) | INTRAVENOUS | Status: DC
  Administered 2019-05-21 – 2019-05-22 (×2): 2 g via INTRAVENOUS
  Filled 2019-05-21 (×2): qty 2

## 2019-05-21 MED ORDER — SODIUM CHLORIDE 0.9 % IV BOLUS (SEPSIS)
500.0000 mL | Freq: Once | INTRAVENOUS | Status: AC
  Administered 2019-05-21: 500 mL via INTRAVENOUS

## 2019-05-21 MED ORDER — ONDANSETRON HCL 4 MG PO TABS: 4.0000 mg | ORAL_TABLET | Freq: Four times a day (QID) | ORAL | Status: DC | PRN

## 2019-05-21 MED ORDER — NITROGLYCERIN 0.4 MG SL SUBL: 0.4000 mg | SUBLINGUAL_TABLET | SUBLINGUAL | Status: DC | PRN

## 2019-05-21 MED ORDER — ENOXAPARIN SODIUM 40 MG/0.4ML ~~LOC~~ SOLN
40.0000 mg | SUBCUTANEOUS | Status: DC
  Filled 2019-05-21: qty 0.4

## 2019-05-21 MED ORDER — ONDANSETRON HCL 4 MG/2ML IJ SOLN: 4.0000 mg | Freq: Four times a day (QID) | INTRAMUSCULAR | Status: DC | PRN

## 2019-05-21 MED ORDER — ACETAMINOPHEN 500 MG PO TABS
1000.0000 mg | ORAL_TABLET | Freq: Once | ORAL | Status: AC
  Administered 2019-05-21: 1000 mg via ORAL
  Filled 2019-05-21: qty 2

## 2019-05-21 MED ORDER — ASPIRIN 81 MG PO CHEW
81.0000 mg | CHEWABLE_TABLET | Freq: Every day | ORAL | Status: DC
  Administered 2019-05-21 – 2019-05-25 (×5): 81 mg via ORAL
  Filled 2019-05-21 (×6): qty 1

## 2019-05-21 NOTE — ED Notes (Signed)
[phone] pts wife [name], would like an update on pt

## 2019-05-21 NOTE — Consult Note (Addendum)
Cardiology Consultation:  Patient ID: Juan [name] MRN: [id number]; DOB: [birth year]  Admit date: 05/21/2019 Date of Consult: 05/21/2019  Primary Care Provider: [name], NP (Inactive) Primary Cardiologist: Quay Burow, MD   Patient Profile:  Juan [name] is a 54 y.o. male with a hx of hypertriglyceridemia (multiple issues with pancreatitis in the past, most recent triglycerides about 5000), CAD status post CABG, diabetes, hypertension who is being seen today for the evaluation of elevated troponin at the request of Early Osmond, MD.  History of Present Illness:  Juan [name] presents with 1 day history of right lower extremity concerning for cellulitis.  He reports that he had multiple issues of cellulitis in the past.  He reports that his right lower extremity became swollen and hot yesterday and then he sought medical care today.  He also reports fever at home associated with nausea.  He was in the waiting room and reports he did not eat or drink anything all day.  On arrival to the emergency room he had rather soft blood pressures around 90/60.  Initial labs show an AKI with a creatinine that is more than doubled up to 2.31.  He had a lactic acidosis on admission with a lactic acid of 4.1 that is trending down.  Cardiac enzymes consistent with high-sensitivity troponin value of 4000 494.  EKG demonstrates normal sinus rhythm right bundle branch block, old anteroseptal infarct and ST depressions noted in anterior lateral leads.  On my interview, he reports no chest pain or shortness of breath.  He reports his main issue is his leg and that is been hurting and he also had an associated fever.  Chest x-ray demonstrates no acute cardiopulmonary process.  He has been started on treatment for sepsis with intravenous fluids as well as antibiotics in the emergency room.  He continues to deny chest pain.  He has a long cardiac history.  He underwent coronary artery bypass grafting x4 in the past.   Most recent left heart cath in 2018 showed a patent LIMA to LAD, occluded vein graft to diagonal, patent radial graft to obtuse marginal branch and high-grade stenosis in vein graft RCA that underwent PCI.  He was noted to have very diffuse severe disease in his native coronary arteries as well as several lesions in the left circumflex that were not amenable to PCI.  Furthermore given that the obtuse marginal arterial graft was patent, the circumflex lesions were left alone.  Heart Pathway Score:       Past Medical History: Past Medical History:  Diagnosis Date  . Abnormal cardiovascular stress test 02/06/2013  . Anemia    occasional - no problems currently(10/02/2011)  . Bilateral renal cysts 06/16/2011   states no known problems  . Blood transfusion without reported diagnosis   . CAD (coronary artery disease), LAD 90%, 1st diag 95%, LCX 70% wth AV groove 90%, RCA 40-50% mid and 80% long distal stenosis 02/03/13 02/04/2013  . Chest pain    positive Myoview stress test  . Coronary artery calcification seen on CAT scan   . Diabetes mellitus    IDDM  . Fatty liver disease, nonalcoholic 123456  . Hyperlipidemia   . Hypertension    states no dx. of HTN, takes med. to protect kidneys due to DM  . Lateral meniscus tear 09/2011   left  . Loose body in knee 09/2011   loose bodies left knee  . Non Hodgkin's lymphoma ([place]) 1991  . Pancreatitis    occasional -  last episode 06/2011  . S/P CABG x 4, 02/05/13 LIMA-LAD; LT. RADIAL-OM;VG-DIAG; VG-PDA 02/06/2013   10/18 3/4 patent grafts (occluded SVG-->Diag), PCI/DESx 3 SVG-->RCA, normal EF  . Splenomegaly, congestive, chronic   . Stuffy and runny nose 10/02/2011   yellow drainage from nose    Past Surgical History: Past Surgical History:  Procedure Laterality Date  . ABDOMINAL AORTIC ANEURYSM REPAIR    . ANTERIOR CERVICAL DECOMP/DISCECTOMY FUSION N/A 03/26/2018   Procedure: ANTERIOR CERVICAL DECOMPRESSION FUSION CERVICAL 5-6 WITH  INSTRUMENTATION AND ALLOGRAFT;  Surgeon: Phylliss Bob, MD;  Location: [place];  Service: Orthopedics;  Laterality: N/A;  . CORONARY ARTERY BYPASS GRAFT N/A 02/05/2013   Procedure: CORONARY ARTERY BYPASS GRAFTING (CABG);  Surgeon: Ivin Poot, MD;  Location: [place];  Service: Open Heart Surgery;  Laterality: N/A;  Coronary artery bypass graft on pump times four using left internal mammary artery and right greater saphenous vein via endovein harvest and left radial artery harvest.   . CORONARY STENT INTERVENTION  12/17/2016    PCI and drug-eluting stenting of the mid and distal RCA SVG   . CORONARY STENT INTERVENTION N/A 12/17/2016   Procedure: CORONARY STENT INTERVENTION;  Surgeon: Lorretta Harp, MD;  Location: [place] [facility];  Service: Cardiovascular;  Laterality: N/A;  . ELBOW SURGERY    . HERNIA REPAIR     inguinal   . herniated disc    . INTRAOPERATIVE TRANSESOPHAGEAL ECHOCARDIOGRAM N/A 02/05/2013   Procedure: INTRAOPERATIVE TRANSESOPHAGEAL ECHOCARDIOGRAM;  Surgeon: Ivin Poot, MD;  Location: [facility];  Service: Open Heart Surgery;  Laterality: N/A;  . KNEE ARTHROSCOPY  10/09/2011   Procedure: ARTHROSCOPY KNEE;  Surgeon: Nita Sells, MD;  Location: [place];  Service: Orthopedics;  Laterality: Left;  . LEFT HEART CATH AND CORS/GRAFTS ANGIOGRAPHY N/A 12/17/2016   Procedure: LEFT HEART CATH AND CORS/GRAFTS ANGIOGRAPHY;  Surgeon: Lorretta Harp, MD;  Location: [place] [facility];  Service: Cardiovascular;  Laterality: N/A;  . LEFT HEART CATHETERIZATION WITH CORONARY ANGIOGRAM N/A 02/03/2013   Procedure: LEFT HEART CATHETERIZATION WITH CORONARY ANGIOGRAM;  Surgeon: Lorretta Harp, MD;  Location: [facility];  Service: Cardiovascular;  Laterality: N/A;  . NASAL SEPTUM SURGERY    . RADIAL ARTERY HARVEST Left 02/05/2013   Procedure: RADIAL ARTERY HARVEST;  Surgeon: Ivin Poot, MD;  Location: [place];  Service: Vascular;  Laterality: Left;  . TIBIA BONE  BIOPSY  x 3   left  . TONSILLECTOMY      Home Medications:  Prior to Admission medications   Medication Sig Start Date End Date Taking? Authorizing Provider  aspirin 81 MG chewable tablet Chew 1 tablet (81 mg total) by mouth daily. 12/19/16  Yes Reino Bellis B, NP  clopidogrel (PLAVIX) 75 MG tablet TAKE 1 TABLET BY MOUTH DAILY WITH BREAKFAST. Patient taking differently: Take 75 mg by mouth daily.  05/18/19  Yes Lorretta Harp, MD  FARXIGA 5 MG TABS tablet Take 5 mg by mouth daily. 09/06/15  Yes [provider]  fenofibrate 160 MG tablet Take 160 mg by mouth daily.    Yes [provider]  furosemide (LASIX) 40 MG tablet TAKE 1 TABLET BY MOUTH EVERY DAY Patient taking differently: Take 40 mg by mouth daily.  01/06/19  Yes Lorretta Harp, MD  Insulin Human (INSULIN PUMP) SOLN Inject into the skin.   Yes [provider]  insulin regular human CONCENTRATED (HUMULIN R) 500 UNIT/ML SOLN injection Inject into the skin continuous.  Using insulin pump   Yes [provider]  isosorbide mononitrate (IMDUR) 30 MG 24 hr tablet Take 0.5 tablets (15 mg total) by mouth daily. Patient taking differently: Take 15 mg by mouth daily as needed (chest pain).  01/07/18 05/21/19 Yes Lorretta Harp, MD  metFORMIN (GLUCOPHAGE) 1000 MG tablet Take 1,000 mg by mouth 2 (two) times daily with a meal.   Yes [provider]  metoprolol succinate (TOPROL-XL) 50 MG 24 hr tablet TAKE 1 TABLET EVERY DAY WITH OR IMMEDIATELY FOLLOWING A MEAL Patient taking differently: Take 50 mg by mouth 2 (two) times daily.  07/15/18  Yes Hilty, Nadean Corwin, MD  nitroGLYCERIN (NITROSTAT) 0.4 MG SL tablet Place 1 tablet (0.4 mg total) under the tongue every 5 (five) minutes as needed. 12/18/16  Yes Cheryln Manly, NP  Propylene Glycol (SYSTANE BALANCE) 0.6 % SOLN Place 1 drop into both eyes daily as needed (dry eyes).   Yes [provider]  ramipril (ALTACE) 5 MG capsule Take 5 mg by  mouth daily. 06/23/15  Yes [provider]  simvastatin (ZOCOR) 10 MG tablet TAKE 1 TABLET BY MOUTH EVERY DAY Patient taking differently: Take 10 mg by mouth daily at 6 PM.  08/05/18  Yes Lorretta Harp, MD  VASCEPA 1 g CAPS TAKE 2 CAPSULES TWICE A DAY Patient taking differently: Take 2 g by mouth 2 (two) times daily.  09/16/18  Yes Pixie Casino, MD    Inpatient Medications: Scheduled Meds: . aspirin  81 mg Oral Daily  . [START ON 05/22/2019] clopidogrel  75 mg Oral Q breakfast  . enoxaparin (LOVENOX) injection  40 mg Subcutaneous Q24H  . [START ON 05/22/2019] fenofibrate  160 mg Oral Daily  . icosapent Ethyl  2 g Oral BID  . insulin pump   Subcutaneous TID WC, HS, 0200  . simvastatin  10 mg Oral Daily   Continuous Infusions: . sodium chloride    . ceFEPime (MAXIPIME) IV     PRN Meds: acetaminophen **OR** acetaminophen, nitroGLYCERIN, ondansetron **OR** ondansetron (ZOFRAN) IV  Allergies:    Allergies  Allergen Reactions  . Reglan [Metoclopramide] Other (See Comments)    Only can tolerate in low doses, in higher doses it has the opposite effect    Social History:   Social History   Socioeconomic History  . Marital status: Married    Spouse name: [name]  . Number of children: 1  . Years of education: 58  . Highest education level: Not on file  Occupational History  . Occupation: Engineer, drilling     Employer: [employer]  Tobacco Use  . Smoking status: Never Smoker  . Smokeless tobacco: Never Used  Substance and Sexual Activity  . Alcohol use: No  . Drug use: No  . Sexual activity: Yes  Other Topics Concern  . Not on file  Social History Narrative   Adopted, so no pertinent FH.  Married.  Lives with wife in [place].  Independent of ADLs and ambulation.   Social Determinants of Health   Financial Resource Strain:   . Difficulty of Paying Living Expenses: Not on file  Food Insecurity:   . Worried About Charity fundraiser in the  Last Year: Not on file  . Ran Out of Food in the Last Year: Not on file  Transportation Needs:   . Lack of Transportation (Medical): Not on file  . Lack of Transportation (Non-Medical): Not on file  Physical Activity:   . Days of  Exercise per Week: Not on file  . Minutes of Exercise per Session: Not on file  Stress:   . Feeling of Stress : Not on file  Social Connections:   . Frequency of Communication with Friends and Family: Not on file  . Frequency of Social Gatherings with Friends and Family: Not on file  . Attends Religious Services: Not on file  . Active Member of Clubs or Organizations: Not on file  . Attends Archivist Meetings: Not on file  . Marital Status: Not on file  Intimate Partner Violence:   . Fear of Current or Ex-Partner: Not on file  . Emotionally Abused: Not on file  . Physically Abused: Not on file  . Sexually Abused: Not on file     Family History:    Family History  Adopted: Yes     ROS:  All other ROS reviewed and negative. Pertinent positives noted in the HPI.     Physical Exam/Data:   Vitals:   05/21/19 0918 05/21/19 1039 05/21/19 1410 05/21/19 1430  BP:  95/61 105/62 96/65  Pulse:  68    Resp:  17 (!) 27 (!) 28  Temp:      TempSrc:      SpO2:  100%    Weight: 105.2 kg     Height: 5\' 10"  (1.778 m)        Intake/Output Summary (Last 24 hours) at 05/21/2019 1642 Last data filed at 05/21/2019 1602 Gross per 24 hour  Intake 2075 ml  Output --  Net 2075 ml    Last 3 Weights 05/21/2019 12/29/2018 08/19/2018  Weight (lbs) 232 lb 235 lb 9.6 oz 228 lb  Weight (kg) 105.235 kg 106.867 kg 103.42 kg    Body mass index is 33.29 kg/m.  General: Ill-appearing Head: Atraumatic, normal size  Eyes: PEERLA, EOMI  Neck: Supple, no JVD Endocrine: No thryomegaly Cardiac: Normal S1, S2; RRR; no murmurs, rubs, or gallops Lungs: Diminished breath sounds at lung bases Abd: Soft, nontender, no hepatomegaly  Ext: The right lower extremity is swollen  and edematous there is an open wound on the right lower heel, the left lower extremity also appears edematous but this is a baseline for him Musculoskeletal: No deformities, BUE and BLE strength normal and equal Skin: Warm and dry, no rashes   Neuro: Alert and oriented to person, place, time, and situation, CNII-XII grossly intact, no focal deficits  Psych: Normal mood and affect   EKG:  The EKG was personally reviewed and demonstrates: Normal sinus rhythm with right bundle branch block, old anteroseptal infarct, diffuse ST depressions anterior lateral leads Telemetry:  Telemetry was personally reviewed and demonstrates: Normal sinus rhythm  Relevant CV Studies: LHC 12/17/2016  Prox RCA to Mid RCA lesion, 80 %stenosed.  Mid RCA lesion, 80 %stenosed.  Dist RCA lesion, 95 %stenosed.  Prox Cx lesion, 95 %stenosed.  Ost 1st Mrg to 1st Mrg lesion, 95 %stenosed.  Ost Cx to Prox Cx lesion, 70 %stenosed.  Mid Cx to Dist Cx lesion, 80 %stenosed.  Ost 1st Diag to 1st Diag lesion, 90 %stenosed.  Ost 2nd Diag to 2nd Diag lesion, 90 %stenosed.  Mid LAD to Dist LAD lesion, 90 %stenosed.  Dist LAD lesion, 100 %stenosed.  LIMA.  Left radial artery.  SVG.  Origin to Prox Graft lesion, 100 %stenosed.  SVG and is large and anatomically normal.  Mid Graft lesion, 80 %stenosed.  Prox Graft lesion, 80 %stenosed.  Post intervention, there is a 0% residual  stenosis.  A stent was successfully placed.  Dist Graft to Insertion lesion, 95 %stenosed.  Post intervention, there is a 0% residual stenosis.  A stent was successfully placed.  The left ventricular systolic function is normal.  LV end diastolic pressure is normal.  The left ventricular ejection fraction is 50-55% by visual estimate.  Laboratory Data: High Sensitivity Troponin:   Recent Labs  Lab 05/21/19 1458  TROPONINIHS 4,494*     Cardiac EnzymesNo results for input(s): TROPONINI in the last 168 hours. No results  for input(s): TROPIPOC in the last 168 hours.  Chemistry Recent Labs  Lab 05/21/19 0923 05/21/19 1458  NA 132*  --   K 4.8  --   CL 100  --   CO2 21*  --   GLUCOSE 145*  --   BUN 28*  --   CREATININE 2.31* 2.54*  CALCIUM 9.9  --   GFRNONAA 31* 28*  GFRAA 36* 32*  ANIONGAP 11  --     Recent Labs  Lab 05/21/19 0923  PROT 6.6  ALBUMIN 3.3*  AST 46*  ALT 43  ALKPHOS 50  BILITOT 1.2   Hematology Recent Labs  Lab 05/21/19 0923 05/21/19 1600  WBC 15.9* 13.2*  RBC 5.19 4.60  HGB 13.5 11.9*  HCT 40.8 36.6*  MCV 78.6* 79.6*  MCH 26.0 25.9*  MCHC 33.1 32.5  RDW 16.7* 16.9*  PLT 123* PENDING   BNPNo results for input(s): BNP, PROBNP in the last 168 hours.  DDimer No results for input(s): DDIMER in the last 168 hours.  Radiology/Studies:  DG Chest Port 1 View  Result Date: 05/21/2019 CLINICAL DATA:  Fever EXAM: PORTABLE CHEST 1 VIEW COMPARISON:  12/14/2016 FINDINGS: Postop CABG. Negative for heart failure. Lungs remain clear without infiltrate or effusion. No mass or adenopathy. IMPRESSION: No active disease. Electronically Signed   By: Franchot Gallo M.D.   On: 05/21/2019 11:26   VAS [place] LOWER EXTREMITY VENOUS (DVT)  Result Date: 05/21/2019  Lower Venous DVTStudy Indications: Swelling.  Risk Factors: None identified. Limitations: Poor ultrasound/tissue interface. Comparison Study: No prior studies. Performing Technologist: Oliver Hum RVT  Examination Guidelines: A complete evaluation includes B-mode imaging, spectral Doppler, color Doppler, and power Doppler as needed of all accessible portions of each vessel. Bilateral testing is considered an integral part of a complete examination. Limited examinations for reoccurring indications may be performed as noted. The reflux portion of the exam is performed with the patient in reverse Trendelenburg.  +---------+---------------+---------+-----------+----------+--------------+ RIGHT     CompressibilityPhasicitySpontaneityPropertiesThrombus Aging +---------+---------------+---------+-----------+----------+--------------+ CFV      Full           Yes      Yes                                 +---------+---------------+---------+-----------+----------+--------------+ SFJ      Full                                                        +---------+---------------+---------+-----------+----------+--------------+ FV Prox  Full                                                        +---------+---------------+---------+-----------+----------+--------------+  FV Mid   Full                                                        +---------+---------------+---------+-----------+----------+--------------+ FV DistalFull                                                        +---------+---------------+---------+-----------+----------+--------------+ PFV      Full                                                        +---------+---------------+---------+-----------+----------+--------------+ POP      Full           Yes      Yes                                 +---------+---------------+---------+-----------+----------+--------------+ PTV      Full                                                        +---------+---------------+---------+-----------+----------+--------------+ PERO     Full                                                        +---------+---------------+---------+-----------+----------+--------------+   +----+---------------+---------+-----------+----------+--------------+ LEFTCompressibilityPhasicitySpontaneityPropertiesThrombus Aging +----+---------------+---------+-----------+----------+--------------+ CFV Full           Yes      Yes                                 +----+---------------+---------+-----------+----------+--------------+     Summary: RIGHT: - There is no evidence of deep vein thrombosis in the lower  extremity.  - No cystic structure found in the popliteal fossa.  LEFT: - No evidence of common femoral vein obstruction.  *See table(s) above for measurements and observations. Electronically signed by Servando Snare MD on 05/21/2019 at 4:08:18 PM.    Final     Assessment and Plan:  1. Demand Ischemia in the setting of sepsis vs NSTEMI: Overall his clinical picture is consistent with demand ischemia in the setting of sepsis.  Troponin is elevated around 4494 and EKG demonstrates diffuse anterolateral ST depressions.  Nonetheless, he reports no chest pain.  On presentation he was a bit hypotensive with a lactic acidosis.  He also has an acute kidney injury where his creatinine has more than doubled.  He has obvious evidence of cellulitis in the right lower extremity.  I am highly concerned for possible osteomyelitis and I discussed this with the hospital medicine team.  His kidney function will preclude any  MRI at this time.  For now he will be treated with intravenous fluids as well as IV antibiotics.  I think this is appropriate regarding his elevated troponin he will continue his home aspirin and Plavix. His platelets are low and around 87k on repeat this PM. Due to this will hold heparin drip at this time. Will continue ASA/plavix as on at home. We will obtain an echocardiogram to determine what his LVEF is.  He does not appear to be in congestive heart failure and has no symptoms suggestive of acute coronary syndrome.  We may just plan to treat this medically.  In the setting of infection and sepsis it is unclear the benefit of a cardiac catheterization.  For now we will treat medically and see how he does clinically. 2. CAD status post CABG: We will hold all antianginal therapy in the setting of severe sepsis with elevated lactic acidosis.  He has no chest pain is quite comfortable.  We will restart his medications as able.   3. Hypertriglyceridemia with history of pancreatitis: No symptoms to suggest  pancreatitis.  Continue home Vascepa, fenofibrate and simvastatin.    For questions or updates, please contact [place] Please consult [email or web address] for contact info under   Signed, [place] T. Audie Box, [place]  05/21/2019 4:42 PM

## 2019-05-21 NOTE — Plan of Care (Signed)

## 2019-05-21 NOTE — ED Provider Notes (Signed)
[facility] EMERGENCY DEPARTMENT Provider Note   CSN: [id number] Arrival date & time: 05/21/19  [id number]     History Chief Complaint  Patient presents with  . Cellulitis    Juan [name] is a 54 y.o. male.  Pt presents to the ED today with right leg redness.  Pt has a hx of cellulitis in the past and feels like he has it again.  Temp 102 last night.  Pt said redness started last night.    PCP:  Dr. Fara Olden Leodis Binet)        Past Medical History:  Diagnosis Date  . Abnormal cardiovascular stress test 02/06/2013  . Anemia    occasional - no problems currently(10/02/2011)  . Bilateral renal cysts 06/16/2011   states no known problems  . Blood transfusion without reported diagnosis   . CAD (coronary artery disease), LAD 90%, 1st diag 95%, LCX 70% wth AV groove 90%, RCA 40-50% mid and 80% long distal stenosis 02/03/13 02/04/2013  . Chest pain    positive Myoview stress test  . Coronary artery calcification seen on CAT scan   . Diabetes mellitus    IDDM  . Fatty liver disease, nonalcoholic [id number]  . Hyperlipidemia   . Hypertension    states no dx. of HTN, takes med. to protect kidneys due to DM  . Lateral meniscus tear 09/2011   left  . Loose body in knee 09/2011   loose bodies left knee  . Non Hodgkin's lymphoma ([place]) 1991  . Pancreatitis    occasional - last episode 06/2011  . S/P CABG x 4, 02/05/13 LIMA-LAD; LT. RADIAL-OM;VG-DIAG; VG-PDA 02/06/2013   10/18 3/4 patent grafts (occluded SVG-->Diag), PCI/DESx 3 SVG-->RCA, normal EF  . Splenomegaly, congestive, chronic   . Stuffy and runny nose 10/02/2011   yellow drainage from nose    Patient Active Problem List   Diagnosis Date Noted  . Radiculopathy 03/26/2018  . Right bundle branch block 01/07/2018  . Chest pain 12/17/2016  . Diastolic dysfunction [id number]  . Edema of both legs 02/03/2016  . Dyspnea on exertion 02/03/2016  . S/P CABG x 4 02/06/2013  . CAD (coronary artery disease)  02/04/2013  . Other malignant lymphomas, unspecified site, extranodal and solid organ sites 08/25/2012  . Personal history of lymphatic and hematopoietic neoplasm 06/05/2012  . Personal history of digestive disease 06/05/2012  . 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 Chronic nonalcoholic liver disease [id number]  . Thrombocytopenia ([place]) 06/17/2011  . Chronic pancreatitis ([place]) 06/16/2011  . Dyslipidemia 06/16/2011  . Hyperkalemia 06/16/2011  . Acute kidney injury ([place]) 06/16/2011  . Dehydration 06/16/2011  . Type 2 diabetes mellitus treated with insulin ([place]) 06/16/2011  . Microcytic anemia 06/16/2011  . Fatty liver disease, nonalcoholic [id number]  . Bilateral renal cysts 06/16/2011  . Splenomegaly 06/16/2011    Past Surgical History:  Procedure Laterality Date  . ABDOMINAL AORTIC ANEURYSM REPAIR    . ANTERIOR CERVICAL DECOMP/DISCECTOMY FUSION N/A 03/26/2018   Procedure: ANTERIOR CERVICAL DECOMPRESSION FUSION CERVICAL 5-6 WITH INSTRUMENTATION AND ALLOGRAFT;  Surgeon: Phylliss Bob, MD;  Location: [facility];  Service: Orthopedics;  Laterality: N/A;  . CORONARY ARTERY BYPASS GRAFT N/A 02/05/2013   Procedure: CORONARY ARTERY BYPASS GRAFTING (CABG);  Surgeon: Ivin Poot, MD;  Location: [facility];  Service: Open Heart Surgery;  Laterality: N/A;  Coronary artery bypass graft on pump times four using left internal mammary artery and right greater saphenous vein via endovein harvest and left radial artery harvest.   . CORONARY STENT  INTERVENTION  12/17/2016    PCI and drug-eluting stenting of the mid and distal RCA SVG   . CORONARY STENT INTERVENTION N/A 12/17/2016   Procedure: CORONARY STENT INTERVENTION;  Surgeon: Lorretta Harp, MD;  Location: [facility];  Service: Cardiovascular;  Laterality: N/A;  . ELBOW SURGERY    . HERNIA REPAIR     inguinal   . herniated disc    . INTRAOPERATIVE TRANSESOPHAGEAL ECHOCARDIOGRAM N/A 02/05/2013   Procedure: INTRAOPERATIVE TRANSESOPHAGEAL  ECHOCARDIOGRAM;  Surgeon: Ivin Poot, MD;  Location: [facility];  Service: Open Heart Surgery;  Laterality: N/A;  . KNEE ARTHROSCOPY  10/09/2011   Procedure: ARTHROSCOPY KNEE;  Surgeon: Nita Sells, MD;  Location: [facility];  Service: Orthopedics;  Laterality: Left;  . LEFT HEART CATH AND CORS/GRAFTS ANGIOGRAPHY N/A 12/17/2016   Procedure: LEFT HEART CATH AND CORS/GRAFTS ANGIOGRAPHY;  Surgeon: Lorretta Harp, MD;  Location: [facility];  Service: Cardiovascular;  Laterality: N/A;  . LEFT HEART CATHETERIZATION WITH CORONARY ANGIOGRAM N/A 02/03/2013   Procedure: LEFT HEART CATHETERIZATION WITH CORONARY ANGIOGRAM;  Surgeon: Lorretta Harp, MD;  Location: [facility] CATH LAB;  Service: Cardiovascular;  Laterality: N/A;  . NASAL SEPTUM SURGERY    . RADIAL ARTERY HARVEST Left 02/05/2013   Procedure: RADIAL ARTERY HARVEST;  Surgeon: Ivin Poot, MD;  Location: [facility];  Service: Vascular;  Laterality: Left;  . TIBIA BONE BIOPSY  x 3   left  . TONSILLECTOMY         Family History  Adopted: Yes    Social History   Tobacco Use  . Smoking status: Never Smoker  . Smokeless tobacco: Never Used  Substance Use Topics  . Alcohol use: No  . Drug use: No    Home Medications Prior to Admission medications   Medication Sig Start Date End Date Taking? Authorizing Provider  aspirin 81 MG chewable tablet Chew 1 tablet (81 mg total) by mouth daily. 12/19/16  Yes Reino Bellis B, NP  clopidogrel (PLAVIX) 75 MG tablet TAKE 1 TABLET BY MOUTH DAILY WITH BREAKFAST. Patient taking differently: Take 75 mg by mouth daily.  05/18/19  Yes Lorretta Harp, MD  FARXIGA 5 MG TABS tablet Take 5 mg by mouth daily. 09/06/15  Yes [provider]  fenofibrate 160 MG tablet Take 160 mg by mouth daily.    Yes [provider]  furosemide (LASIX) 40 MG tablet TAKE 1 TABLET BY MOUTH EVERY DAY Patient taking differently: Take 40 mg by mouth daily.  01/06/19  Yes Lorretta Harp, MD  Insulin Human (INSULIN PUMP) SOLN Inject into the skin.   Yes [provider]  insulin regular human CONCENTRATED (HUMULIN R) 500 UNIT/ML SOLN injection Inject into the skin continuous. Using insulin pump   Yes [provider]  isosorbide mononitrate (IMDUR) 30 MG 24 hr tablet Take 0.5 tablets (15 mg total) by mouth daily. Patient taking differently: Take 15 mg by mouth daily as needed (chest pain).  01/07/18 05/21/19 Yes Lorretta Harp, MD  metFORMIN (GLUCOPHAGE) 1000 MG tablet Take 1,000 mg by mouth 2 (two) times daily with a meal.   Yes [provider]  metoprolol succinate (TOPROL-XL) 50 MG 24 hr tablet TAKE 1 TABLET EVERY DAY WITH OR IMMEDIATELY FOLLOWING A MEAL Patient taking differently: Take 50 mg by mouth 2 (two) times daily.  07/15/18  Yes Hilty, Nadean Corwin, MD  nitroGLYCERIN (NITROSTAT) 0.4 MG SL tablet Place 1 tablet (0.4 mg total)  under the tongue every 5 (five) minutes as needed. 12/18/16  Yes Cheryln Manly, NP  Propylene Glycol (SYSTANE BALANCE) 0.6 % SOLN Place 1 drop into both eyes daily as needed (dry eyes).   Yes [provider]  ramipril (ALTACE) 5 MG capsule Take 5 mg by mouth daily. 06/23/15  Yes [provider]  simvastatin (ZOCOR) 10 MG tablet TAKE 1 TABLET BY MOUTH EVERY DAY Patient taking differently: Take 10 mg by mouth daily at 6 PM.  08/05/18  Yes Lorretta Harp, MD  VASCEPA 1 g CAPS TAKE 2 CAPSULES TWICE A DAY Patient taking differently: Take 2 g by mouth 2 (two) times daily.  09/16/18  Yes Hilty, Nadean Corwin, MD    Allergies    Reglan [metoclopramide]  Review of Systems   Review of Systems  Constitutional: Positive for fever.  Skin: Positive for [name].  All other systems reviewed and are negative.   Physical Exam Updated Vital Signs BP 105/62   Pulse 68   Temp 98.2 F (36.8 C) (Oral)   Resp (!) 27   Ht 5\' 10"  (1.778 m)   Wt 105.2 kg   SpO2 100%   BMI 33.29 kg/m   Physical Exam Vitals  and nursing note reviewed.  Constitutional:      Appearance: Normal appearance.  HENT:     Head: Normocephalic and atraumatic.     Right Ear: External ear normal.     Left Ear: External ear normal.     Nose: Nose normal.     Mouth/Throat:     Mouth: Mucous membranes are moist.     Pharynx: Oropharynx is clear.  Eyes:     Extraocular Movements: Extraocular movements intact.     Conjunctiva/sclera: Conjunctivae normal.     Pupils: Pupils are equal, round, and reactive to light.  Cardiovascular:     Rate and Rhythm: Normal rate and regular rhythm.     Pulses: Normal pulses.     Heart sounds: Normal heart sounds.  Pulmonary:     Effort: Pulmonary effort is normal.     Breath sounds: Normal breath sounds.  Abdominal:     General: Abdomen is flat. Bowel sounds are normal.     Palpations: Abdomen is soft.  Musculoskeletal:        General: Normal range of motion.     Cervical back: Normal range of motion and neck supple.  Skin:    General: Skin is warm.     Comments: See picture  Neurological:     General: No focal deficit present.     Mental Status: He is alert and oriented to person, place, and time.  Psychiatric:        Mood and Affect: Mood normal.        Behavior: Behavior normal.        Thought Content: Thought content normal.        Judgment: Judgment normal.       ED Results / Procedures / Treatments   Labs (all labs ordered are listed, but only abnormal results are displayed) Labs Reviewed  COMPREHENSIVE METABOLIC PANEL - Abnormal; Notable for the following components:      Result Value   Sodium 132 (*)    CO2 21 (*)    Glucose, Bld 145 (*)    BUN 28 (*)    Creatinine, Ser 2.31 (*)    Albumin 3.3 (*)    AST 46 (*)    GFR calc non Af Amer 31 (*)  GFR calc Af Amer 36 (*)    All other components within normal limits  LACTIC ACID, PLASMA - Abnormal; Notable for the following components:   Lactic Acid, Venous 4.1 (*)    All other components within normal  limits  LACTIC ACID, PLASMA - Abnormal; Notable for the following components:   Lactic Acid, Venous 3.7 (*)    All other components within normal limits  CBC WITH DIFFERENTIAL/PLATELET - Abnormal; Notable for the following components:   WBC 15.9 (*)    MCV 78.6 (*)    RDW 16.7 (*)    Platelets 123 (*)    Neutro Abs 13.8 (*)    Abs Immature Granulocytes 0.34 (*)    All other components within normal limits  CULTURE, BLOOD (ROUTINE X 2)  CULTURE, BLOOD (ROUTINE X 2)  URINE CULTURE  RESPIRATORY PANEL BY RT PCR (FLU A&B, COVID)  PROTIME-INR  URINALYSIS, ROUTINE W REFLEX MICROSCOPIC  TROPONIN I (HIGH SENSITIVITY)    EKG EKG Interpretation  Date/Time:  Thursday May 21 2019 13:53:28 EST Ventricular Rate:  100 PR Interval:    QRS Duration: 128 QT Interval:  353 QTC Calculation: 456 R Axis:   98 Text Interpretation: Sinus tachycardia Borderline prolonged PR interval Right bundle branch block Abnrm T, consider ischemia, anterolateral lds Baseline wander in lead(s) V3 ST depressions are new Confirmed by Isla Pence [phone]) on 05/21/2019 1:58:51 PM   Radiology DG Chest Port 1 View  Result Date: 05/21/2019 CLINICAL DATA:  Fever EXAM: PORTABLE CHEST 1 VIEW COMPARISON:  12/14/2016 FINDINGS: Postop CABG. Negative for heart failure. Lungs remain clear without infiltrate or effusion. No mass or adenopathy. IMPRESSION: No active disease. Electronically Signed   By: Franchot Gallo M.D.   On: 05/21/2019 11:26    Procedures Procedures (including critical care time)  Medications Ordered in ED Medications  sodium chloride 0.9 % bolus 1,000 mL (has no administration in time range)    And  sodium chloride 0.9 % bolus 1,000 mL (1,000 mLs Intravenous New Bag/Given 05/21/19 1408)    And  sodium chloride 0.9 % bolus 1,000 mL (1,000 mLs Intravenous New Bag/Given 05/21/19 1358)    And  sodium chloride 0.9 % bolus 500 mL (has no administration in time range)  cefTRIAXone (ROCEPHIN) 2 g in sodium  chloride 0.9 % 100 mL IVPB (0 g Intravenous Stopped 05/21/19 1408)  acetaminophen (TYLENOL) tablet 1,000 mg (has no administration in time range)    ED Course  I have reviewed the triage vital signs and the nursing notes.  Pertinent labs & imaging results that were available during my care of the patient were reviewed by me and considered in my medical decision making (see chart for details).    MDM Rules/Calculators/A&P                     Pt meets sepsis criteria, so he is given IVFs and IV rocephin.  BP is improving slowly, but pt looks better.  Pt also has a significant increase in Cr from last year.  EKG does show some ST depression.  No CP.  Troponin added on.  Pt d/w Dr. Doristine Bosworth (triad) for admission.  Covid swab pending.  CRITICAL CARE Performed by: Isla Pence   Total critical care time: 30 minutes  Critical care time was exclusive of separately billable procedures and treating other patients.  Critical care was necessary to treat or prevent imminent or life-threatening deterioration.  Critical care was time spent personally by me on  the following activities: development of treatment plan with patient and/or surrogate as well as nursing, discussions with consultants, evaluation of patient's response to treatment, examination of patient, obtaining history from patient or surrogate, ordering and performing treatments and interventions, ordering and review of laboratory studies, ordering and review of radiographic studies, pulse oximetry and re-evaluation of patient's condition.  Final Clinical Impression(s) / ED Diagnoses Final diagnoses:  Cellulitis of right leg  AKI (acute kidney injury) ([place])  Sepsis with acute renal failure without septic shock, due to unspecified organism, unspecified acute renal failure type ([place])  ST segment depression    Rx / DC Orders ED Discharge Orders    None       Isla Pence, MD 05/21/19 1439

## 2019-05-21 NOTE — Progress Notes (Signed)
Pharmacy Antibiotic Note  Juan [name] is a 54 y.o. male admitted on 05/21/2019 with sepsis and cellulitis.  Pharmacy has been consulted for cefepime dosing. Pt is afebrile but WBC is elevated at 15.9. SCr and lactic acid are elevated.   Plan: Cefepime 2gm IV Q12H F/u renal fxn, C&S, clinical status   Height: 5\' 10"  (177.8 cm) Weight: 232 lb (105.2 kg) IBW/kg (Calculated) : 73  Temp (24hrs), Avg:98.2 F (36.8 C), Min:98.2 F (36.8 C), Max:98.2 F (36.8 C)  Recent Labs  Lab 05/21/19 0923 05/21/19 1133  WBC 15.9*  --   CREATININE 2.31*  --   LATICACIDVEN 4.1* 3.7*    Estimated Creatinine Clearance: 45.4 mL/min (A) (by C-G formula based on SCr of 2.31 mg/dL (H)).    Allergies  Allergen Reactions  . Reglan [Metoclopramide] Other (See Comments)    Only can tolerate in low doses, in higher doses it has the opposite effect    Antimicrobials this admission: Cefepime 3/4>> CTX x 1 3/4  Dose adjustments this admission: N/A  Microbiology results: Pending  Thank you for allowing pharmacy to be a part of this patient's care.  Jamyia Fortune, Rande Lawman 05/21/2019 3:08 PM

## 2019-05-21 NOTE — ED Triage Notes (Signed)
Pt reports cellulitis to right lower leg since last night, redness and swelling noted to leg. Pt diabetic with insulin pump. Pt ambulatory.

## 2019-05-21 NOTE — H&P (Addendum)
History and Physical    Juan [name] [id number] DOB: 05/26/1965 DOA: 05/21/2019  PCP: [name], NP (Inactive)  Patient coming from: Home I have personally briefly reviewed patient's old medical records in [place]  Chief Complaint: Right leg pain, swelling, redness started last night  HPI: Juan [name] is a 54 y.o. male with medical history significant of hypertension, hyperlipidemia, diabetes mellitus on insulin pump, coronary artery disease status post CABG x4, microcytic anemia, thrombocytopenia presents to emergency department due to right leg pain, swelling, redness which started last night.  Patient tells me that he was doing fine and last night he developed fever of 102 with chills and noticed right leg pain, swelling and redness.  Patient tells me that he has history of multiple episodes of cellulitis in the past.  No history of trauma however reports nausea.  Denies headache, blurry vision, chest pain, shortness of breath, palpitation, orthopnea, PND, abdominal pain, urinary or bowel changes.  He lives with his wife, no history of smoking, alcohol, illicit drug use.  He uses over-the-counter NSAIDs as needed however denies melena.  He is compliant with his home medication.  ED Course: Upon arrival: Patient's blood pressure was in 90s, tachycardic, tachypneic, WBC: 15.9, lactic acid: 4.1, blood culture, urine culture, troponin, COVID-19 pending.  Patient received IV fluids and Rocephin in ED.  Review of Systems: As per HPI otherwise negative.    Past Medical History:  Diagnosis Date  . Abnormal cardiovascular stress test 02/06/2013  . Anemia    occasional - no problems currently(10/02/2011)  . Bilateral renal cysts 06/16/2011   states no known problems  . Blood transfusion without reported diagnosis   . CAD (coronary artery disease), LAD 90%, 1st diag 95%, LCX 70% wth AV groove 90%, RCA 40-50% mid and 80% long distal stenosis 02/03/13 02/04/2013  . Chest pain      positive Myoview stress test  . Coronary artery calcification seen on CAT scan   . Diabetes mellitus    IDDM  . Fatty liver disease, nonalcoholic [id number]  . Hyperlipidemia   . Hypertension    states no dx. of HTN, takes med. to protect kidneys due to DM  . Lateral meniscus tear 09/2011   left  . Loose body in knee 09/2011   loose bodies left knee  . Non Hodgkin's lymphoma ([place]) 1991  . Pancreatitis    occasional - last episode 06/2011  . S/P CABG x 4, 02/05/13 LIMA-LAD; LT. RADIAL-OM;VG-DIAG; VG-PDA 02/06/2013   10/18 3/4 patent grafts (occluded SVG-->Diag), PCI/DESx 3 SVG-->RCA, normal EF  . Splenomegaly, congestive, chronic   . Stuffy and runny nose 10/02/2011   yellow drainage from nose    Past Surgical History:  Procedure Laterality Date  . ABDOMINAL AORTIC ANEURYSM REPAIR    . ANTERIOR CERVICAL DECOMP/DISCECTOMY FUSION N/A 03/26/2018   Procedure: ANTERIOR CERVICAL DECOMPRESSION FUSION CERVICAL 5-6 WITH INSTRUMENTATION AND ALLOGRAFT;  Surgeon: Phylliss Bob, MD;  Location: [place];  Service: Orthopedics;  Laterality: N/A;  . CORONARY ARTERY BYPASS GRAFT N/A 02/05/2013   Procedure: CORONARY ARTERY BYPASS GRAFTING (CABG);  Surgeon: Ivin Poot, MD;  Location: [place];  Service: Open Heart Surgery;  Laterality: N/A;  Coronary artery bypass graft on pump times four using left internal mammary artery and right greater saphenous vein via endovein harvest and left radial artery harvest.   . CORONARY STENT INTERVENTION  12/17/2016    PCI and drug-eluting stenting of the mid and distal RCA SVG   .  CORONARY STENT INTERVENTION N/A 12/17/2016   Procedure: CORONARY STENT INTERVENTION;  Surgeon: Lorretta Harp, MD;  Location: [place] [facility];  Service: Cardiovascular;  Laterality: N/A;  . ELBOW SURGERY    . HERNIA REPAIR     inguinal   . herniated disc    . INTRAOPERATIVE TRANSESOPHAGEAL ECHOCARDIOGRAM N/A 02/05/2013   Procedure: INTRAOPERATIVE TRANSESOPHAGEAL ECHOCARDIOGRAM;   Surgeon: Ivin Poot, MD;  Location: [place];  Service: Open Heart Surgery;  Laterality: N/A;  . KNEE ARTHROSCOPY  10/09/2011   Procedure: ARTHROSCOPY KNEE;  Surgeon: Nita Sells, MD;  Location: [place];  Service: Orthopedics;  Laterality: Left;  . LEFT HEART CATH AND CORS/GRAFTS ANGIOGRAPHY N/A 12/17/2016   Procedure: LEFT HEART CATH AND CORS/GRAFTS ANGIOGRAPHY;  Surgeon: Lorretta Harp, MD;  Location: [place] [facility];  Service: Cardiovascular;  Laterality: N/A;  . LEFT HEART CATHETERIZATION WITH CORONARY ANGIOGRAM N/A 02/03/2013   Procedure: LEFT HEART CATHETERIZATION WITH CORONARY ANGIOGRAM;  Surgeon: Lorretta Harp, MD;  Location: [facility] C[facility];  Service: Cardiovascular;  Laterality: N/A;  . NASAL SEPTUM SURGERY    . RADIAL ARTERY HARVEST Left 02/05/2013   Procedure: RADIAL ARTERY HARVEST;  Surgeon: Ivin Poot, MD;  Location: [place];  Service: Vascular;  Laterality: Left;  . TIBIA BONE BIOPSY  x 3   left  . TONSILLECTOMY       reports that he has never smoked. He has never used smokeless tobacco. He reports that he does not drink alcohol or use drugs.  Allergies  Allergen Reactions  . Reglan [Metoclopramide] Other (See Comments)    Only can tolerate in low doses, in higher doses it has the opposite effect    Family History  Adopted: Yes    Prior to Admission medications   Medication Sig Start Date End Date Taking? Authorizing Provider  aspirin 81 MG chewable tablet Chew 1 tablet (81 mg total) by mouth daily. 12/19/16  Yes Reino Bellis B, NP  clopidogrel (PLAVIX) 75 MG tablet TAKE 1 TABLET BY MOUTH DAILY WITH BREAKFAST. Patient taking differently: Take 75 mg by mouth daily.  05/18/19  Yes Lorretta Harp, MD  FARXIGA 5 MG TABS tablet Take 5 mg by mouth daily. 09/06/15  Yes [provider]  fenofibrate 160 MG tablet Take 160 mg by mouth daily.    Yes [provider]  furosemide (LASIX) 40 MG tablet TAKE 1 TABLET BY MOUTH  EVERY DAY Patient taking differently: Take 40 mg by mouth daily.  01/06/19  Yes Lorretta Harp, MD  Insulin Human (INSULIN PUMP) SOLN Inject into the skin.   Yes [provider]  insulin regular human CONCENTRATED (HUMULIN R) 500 UNIT/ML SOLN injection Inject into the skin continuous. Using insulin pump   Yes [provider]  isosorbide mononitrate (IMDUR) 30 MG 24 hr tablet Take 0.5 tablets (15 mg total) by mouth daily. Patient taking differently: Take 15 mg by mouth daily as needed (chest pain).  01/07/18 05/21/19 Yes Lorretta Harp, MD  metFORMIN (GLUCOPHAGE) 1000 MG tablet Take 1,000 mg by mouth 2 (two) times daily with a meal.   Yes [provider]  metoprolol succinate (TOPROL-XL) 50 MG 24 hr tablet TAKE 1 TABLET EVERY DAY WITH OR IMMEDIATELY FOLLOWING A MEAL Patient taking differently: Take 50 mg by mouth 2 (two) times daily.  07/15/18  Yes Hilty, Nadean Corwin, MD  nitroGLYCERIN (NITROSTAT) 0.4 MG SL tablet Place 1 tablet (0.4 mg total) under the tongue every  5 (five) minutes as needed. 12/18/16  Yes Cheryln Manly, NP  Propylene Glycol (SYSTANE BALANCE) 0.6 % SOLN Place 1 drop into both eyes daily as needed (dry eyes).   Yes [provider]  ramipril (ALTACE) 5 MG capsule Take 5 mg by mouth daily. 06/23/15  Yes [provider]  simvastatin (ZOCOR) 10 MG tablet TAKE 1 TABLET BY MOUTH EVERY DAY Patient taking differently: Take 10 mg by mouth daily at 6 PM.  08/05/18  Yes Lorretta Harp, MD  VASCEPA 1 g CAPS TAKE 2 CAPSULES TWICE A DAY Patient taking differently: Take 2 g by mouth 2 (two) times daily.  09/16/18  Yes Pixie Casino, MD    Physical Exam: Vitals:   05/21/19 0918 05/21/19 1039 05/21/19 1410 05/21/19 1430  BP:  95/61 105/62 96/65  Pulse:  68    Resp:  17 (!) 27 (!) 28  Temp:      TempSrc:      SpO2:  100%    Weight: 105.2 kg     Height: 5\' 10"  (1.778 m)       Constitutional: NAD, calm, comfortable Eyes: PERRL, lids and  conjunctivae normal ENMT: Mucous membranes are moist. Posterior pharynx clear of any exudate or lesions.Normal dentition.  Neck: normal, supple, no masses, no thyromegaly Respiratory: clear to auscultation bilaterally, no wheezing, no crackles. Normal respiratory effort. No accessory muscle use.  Cardiovascular: Regular rate and rhythm, no murmurs / rubs / gallops. No extremity edema. 2+ pedal pulses. No carotid bruits.  Abdomen: no tenderness, no masses palpated. No hepatosplenomegaly. Bowel sounds positive.  Musculoskeletal:      Neurologic: CN 2-12 grossly intact. Sensation intact, DTR normal. Strength 5/5 in all 4.  Psychiatric: Normal judgment and insight. Alert and oriented x 3. Normal mood.    Labs on Admission: I have personally reviewed following labs and imaging studies  CBC: Recent Labs  Lab 05/21/19 0923  WBC 15.9*  NEUTROABS 13.8*  HGB 13.5  HCT 40.8  MCV 78.6*  PLT [id number]*   Basic Metabolic Panel: Recent Labs  Lab 05/21/19 0923 05/21/19 1458  NA 132*  --   K 4.8  --   CL 100  --   CO2 21*  --   GLUCOSE 145*  --   BUN 28*  --   CREATININE 2.31* 2.54*  CALCIUM 9.9  --   MG  --  1.7   GFR: Estimated Creatinine Clearance: 41.3 mL/min (A) (by C-G formula based on SCr of 2.54 mg/dL (H)). Liver Function Tests: Recent Labs  Lab 05/21/19 0923  AST 46*  ALT 43  ALKPHOS 50  BILITOT 1.2  PROT 6.6  ALBUMIN 3.3*   No results for input(s): LIPASE, AMYLASE in the last 168 hours. No results for input(s): AMMONIA in the last 168 hours. Coagulation Profile: Recent Labs  Lab 05/21/19 0923  INR 1.0   Cardiac Enzymes: No results for input(s): CKTOTAL, CKMB, CKMBINDEX, TROPONINI in the last 168 hours. BNP (last 3 results) No results for input(s): PROBNP in the last 8760 hours. HbA1C: No results for input(s): HGBA1C in the last 72 hours. CBG: No results for input(s): GLUCAP in the last 168 hours. Lipid Profile: No results for input(s): CHOL, HDL, LDLCALC,  TRIG, CHOLHDL, LDLDIRECT in the last 72 hours. Thyroid Function Tests: No results for input(s): TSH, T4TOTAL, FREET4, T3FREE, THYROIDAB in the last 72 hours. Anemia Panel: No results for input(s): VITAMINB12, FOLATE, FERRITIN, TIBC, IRON, RETICCTPCT in the last 72 hours. Urine  analysis:    Component Value Date/Time   COLORURINE STRAW (A) 03/24/2018 [place] 03/24/2018 0834   LABSPEC 1.014 03/24/2018 0834   PHURINE 5.0 03/24/2018 0834   GLUCOSEU >=500 (A) 03/24/2018 0834   HGBUR SMALL (A) 03/24/2018 0834   BILIRUBINUR NEGATIVE 03/24/2018 0834   KETONESUR NEGATIVE 03/24/2018 0834   PROTEINUR >=300 (A) 03/24/2018 0834   UROBILINOGEN 0.2 02/04/2013 1430   NITRITE NEGATIVE 03/24/2018 0834   LEUKOCYTESUR NEGATIVE 03/24/2018 0834    Radiological Exams on Admission: DG Chest Port 1 View  Result Date: 05/21/2019 CLINICAL DATA:  Fever EXAM: PORTABLE CHEST 1 VIEW COMPARISON:  12/14/2016 FINDINGS: Postop CABG. Negative for heart failure. Lungs remain clear without infiltrate or effusion. No mass or adenopathy. IMPRESSION: No active disease. Electronically Signed   By: Franchot Gallo M.D.   On: 05/21/2019 11:26   VAS [place] LOWER EXTREMITY VENOUS (DVT)  Result Date: 05/21/2019  Lower Venous DVTStudy Indications: Swelling.  Risk Factors: None identified. Limitations: Poor ultrasound/tissue interface. Comparison Study: No prior studies. Performing Technologist: Oliver Hum RVT  Examination Guidelines: A complete evaluation includes B-mode imaging, spectral Doppler, color Doppler, and power Doppler as needed of all accessible portions of each vessel. Bilateral testing is considered an integral part of a complete examination. Limited examinations for reoccurring indications may be performed as noted. The reflux portion of the exam is performed with the patient in reverse Trendelenburg.  +---------+---------------+---------+-----------+----------+--------------+ RIGHT     CompressibilityPhasicitySpontaneityPropertiesThrombus Aging +---------+---------------+---------+-----------+----------+--------------+ CFV      Full           Yes      Yes                                 +---------+---------------+---------+-----------+----------+--------------+ SFJ      Full                                                        +---------+---------------+---------+-----------+----------+--------------+ FV Prox  Full                                                        +---------+---------------+---------+-----------+----------+--------------+ FV Mid   Full                                                        +---------+---------------+---------+-----------+----------+--------------+ FV DistalFull                                                        +---------+---------------+---------+-----------+----------+--------------+ PFV      Full                                                        +---------+---------------+---------+-----------+----------+--------------+  POP      Full           Yes      Yes                                 +---------+---------------+---------+-----------+----------+--------------+ PTV      Full                                                        +---------+---------------+---------+-----------+----------+--------------+ PERO     Full                                                        +---------+---------------+---------+-----------+----------+--------------+   +----+---------------+---------+-----------+----------+--------------+ LEFTCompressibilityPhasicitySpontaneityPropertiesThrombus Aging +----+---------------+---------+-----------+----------+--------------+ CFV Full           Yes      Yes                                 +----+---------------+---------+-----------+----------+--------------+     Summary: RIGHT: - There is no evidence of deep vein thrombosis in the lower  extremity.  - No cystic structure found in the popliteal fossa.  LEFT: - No evidence of common femoral vein obstruction.  *See table(s) above for measurements and observations. Electronically signed by Servando Snare MD on 05/21/2019 at 4:08:18 PM.    Final     EKG: Sinus tachycardia, right bundle branch block, ST depression in anterior leads  Assessment/Plan Principal Problem:   Sepsis ([place]) Active Problems:   Acute kidney injury ([place])   Microcytosis   Thrombocytopenia ([place])   Hyperlipidemia   CAD (coronary artery disease)   HTN (hypertension)   Cellulitis of right leg   Hyponatremia   Lactic acidosis   Elevated troponin   Sepsis in the setting of right leg cellulitis: -Patient presented with right leg pain, swelling, redness, history of multiple episodes of cellulitis in the past, patient is diabetic. -Upon arrival: Patient hypotensive, tachycardic, tachypneic, lactic acid elevated at 4.1, leukocytosis of 15.9. -Patient received IV Rocephin in ED. -Blood culture is obtained and is pending. -Admit patient at progressive unit for close monitoring. -Start on broad-spectrum antibiotics vancomycin and cefepime.  Will get right lower extremity Doppler ultrasound to rule out DVT. -Get MRI of right lower extremity to rule out osteomyelitis -Monitor vitals closely.  Continue IV fluids.  Elevated troponin: -Likely in the setting of sepsis.  Patient is asymptomatic. -Reviewed EKG.  Discussed with cardiology Dr. ONeal-recommended not to start on heparin, trend troponin.  His elevated troponin is likely secondary to sepsis.    AKI: -Likely secondary to underlying infection.  Continue IV fluids and monitor kidney function closely.  Avoid nephrotoxic medication.  Lactic acidosis: -Likely secondary to sepsis.  Continue IV fluids.  Hypertension: -Patient's current blood pressure is on lower side.  We will hold his home antihypertensive medication-ramipril, metoprolol, Imdur and Lasix And  monitor his blood pressure closely.  Coronary artery disease status post CABG x4: -Patient denies ACS symptoms.  Troponin is elevated likely secondary to underlying sepsis.  Reviewed EKG.  Discussed with cardiology-  we will continue aspirin and Plavix and hold metoprolol, ramipril and Imdur for now due to AKI.  Cardiology will evaluate the patient.  Diabetes mellitus: On insulin pump and Metformin at home. -Check A1c and continue insulin pump.  Hold Metformin due to AKI  Grade 2 diastolic congestive heart failure: Reviewed echo from 12/17 which showed reserved ejection fraction of 60 to 65%.  No signs of fluid overload noted.  -Lasix for now due to AKI.  Strict INO's and daily weight. -Monitor signs of fluid overload.  Monitor electrolytes closely. -Ordered transthoracic echo  Hyperlipidemia: Continue fenofibrate, Farxiga and simvastatin  Microcytosis: -Check iron studies.  H&H is stable.  No history of melena.  Thrombocytopenia: -Monitor platelet count closely, no signs of bleeding.  DVT prophylaxis: Lovenox, teds and SCD  code Status: Full code Family Communication: None present at bedside.  Plan of care discussed with patient in length and he verbalized understanding and agreed with it. Disposition Plan: To be determined  consults called: Cardiology Admission status: Inpatient  Mckinley Jewel MD [facility] Pager [phone]  If 7PM-7AM, please contact night-coverage [email or web address] Password [facility]  05/21/2019, 4:18 PM

## 2019-05-21 NOTE — ED Notes (Signed)
Notified Admitting Provider of elevated Troponin; refer to order set for further orders.

## 2019-05-21 NOTE — Progress Notes (Signed)
[facility] informed of need for MRI w/o contrast d/t GFR too low. MEWS score yellow d/t temp and HR, MD updated.

## 2019-05-21 NOTE — Progress Notes (Signed)
Right lower extremity venous duplex has been completed. Preliminary results can be found in CV Proc through chart review.   05/21/19 3:52 PM Carlos Levering RVT

## 2019-05-22 ENCOUNTER — Inpatient Hospital Stay (HOSPITAL_COMMUNITY): Payer: Commercial Managed Care - PPO

## 2019-05-22 DIAGNOSIS — I5040 Unspecified combined systolic (congestive) and diastolic (congestive) heart failure: Secondary | ICD-10-CM

## 2019-05-22 LAB — CBC
HCT: 35.7 % — ABNORMAL LOW (ref 39.0–52.0)
Hemoglobin: 11.6 g/dL — ABNORMAL LOW (ref 13.0–17.0)
MCH: 25.8 pg — ABNORMAL LOW (ref 26.0–34.0)
MCHC: 32.5 g/dL (ref 30.0–36.0)
MCV: 79.3 fL — ABNORMAL LOW (ref 80.0–100.0)
Platelets: 69 10*3/uL — ABNORMAL LOW (ref 150–400)
RBC: 4.5 MIL/uL (ref 4.22–5.81)
RDW: 17.1 % — ABNORMAL HIGH (ref 11.5–15.5)
WBC: 9.6 10*3/uL (ref 4.0–10.5)
nRBC: 0 % (ref 0.0–0.2)

## 2019-05-22 LAB — BASIC METABOLIC PANEL
Anion gap: 6 (ref 5–15)
Anion gap: 11 (ref 5–15)
BUN: 34 mg/dL — ABNORMAL HIGH (ref 6–20)
BUN: 35 mg/dL — ABNORMAL HIGH (ref 6–20)
CO2: 20 mmol/L — ABNORMAL LOW (ref 22–32)
CO2: 18 mmol/L — ABNORMAL LOW (ref 22–32)
Calcium: 9.1 mg/dL (ref 8.9–10.3)
Calcium: 9.7 mg/dL (ref 8.9–10.3)
Chloride: 105 mmol/L (ref 98–111)
Chloride: 102 mmol/L (ref 98–111)
Creatinine, Ser: 1.87 mg/dL — ABNORMAL HIGH (ref 0.61–1.24)
Creatinine, Ser: 1.62 mg/dL — ABNORMAL HIGH (ref 0.61–1.24)
GFR calc Af Amer: 47 mL/min — ABNORMAL LOW (ref >60)
GFR calc Af Amer: 56 mL/min — ABNORMAL LOW (ref >60)
GFR calc non Af Amer: 40 mL/min — ABNORMAL LOW (ref >60)
GFR calc non Af Amer: 48 mL/min — ABNORMAL LOW (ref >60)
Glucose, Bld: 133 mg/dL — ABNORMAL HIGH (ref 70–99)
Glucose, Bld: 100 mg/dL — ABNORMAL HIGH (ref 70–99)
Potassium: 6 mmol/L — ABNORMAL HIGH (ref 3.5–5.1)
Potassium: 4.4 mmol/L (ref 3.5–5.1)
Sodium: 131 mmol/L — ABNORMAL LOW (ref 135–145)
Sodium: 131 mmol/L — ABNORMAL LOW (ref 135–145)

## 2019-05-22 LAB — ECHOCARDIOGRAM COMPLETE
Height: 70 in
Weight: 3873.04 oz

## 2019-05-22 LAB — GLUCOSE, CAPILLARY
Glucose-Capillary: 120 mg/dL — ABNORMAL HIGH (ref 70–99)
Glucose-Capillary: 105 mg/dL — ABNORMAL HIGH (ref 70–99)
Glucose-Capillary: 112 mg/dL — ABNORMAL HIGH (ref 70–99)
Glucose-Capillary: 96 mg/dL (ref 70–99)
Glucose-Capillary: 91 mg/dL (ref 70–99)

## 2019-05-22 LAB — TROPONIN I (HIGH SENSITIVITY)
Troponin I (High Sensitivity): 8114 ng/L (ref <18)
Troponin I (High Sensitivity): 8668 ng/L (ref <18)

## 2019-05-22 LAB — VITAMIN B12: Vitamin B-12: 524 pg/mL (ref 180–914)

## 2019-05-22 LAB — POTASSIUM: Potassium: 4.6 mmol/L (ref 3.5–5.1)

## 2019-05-22 LAB — NA AND K (SODIUM & POTASSIUM), RAND UR
Potassium Urine: 49 mmol/L
Sodium, Ur: <10 mmol/L

## 2019-05-22 LAB — URINE CULTURE: Culture: NO GROWTH

## 2019-05-22 MED ORDER — PIPERACILLIN-TAZOBACTAM 3.375 G IVPB
3.3750 g | Freq: Three times a day (TID) | INTRAVENOUS | Status: DC
  Administered 2019-05-22 – 2019-05-25 (×9): 3.375 g via INTRAVENOUS
  Filled 2019-05-22 (×9): qty 50

## 2019-05-22 MED ORDER — FERROUS SULFATE 325 (65 FE) MG PO TABS
325.0000 mg | ORAL_TABLET | Freq: Three times a day (TID) | ORAL | Status: DC
  Administered 2019-05-22 – 2019-05-25 (×10): 325 mg via ORAL
  Filled 2019-05-22 (×12): qty 1

## 2019-05-22 MED ORDER — SODIUM ZIRCONIUM CYCLOSILICATE 10 G PO PACK
10.0000 g | PACK | Freq: Once | ORAL | Status: AC
  Administered 2019-05-22: 10 g via ORAL
  Filled 2019-05-22: qty 1

## 2019-05-22 MED ORDER — POLYETHYLENE GLYCOL 3350 17 G PO PACK: 17.0000 g | PACK | Freq: Every day | ORAL | Status: DC | PRN

## 2019-05-22 MED ORDER — SODIUM CHLORIDE 0.9 % IV BOLUS
500.0000 mL | Freq: Once | INTRAVENOUS | Status: AC
  Administered 2019-05-22: 500 mL via INTRAVENOUS

## 2019-05-22 MED ORDER — METOPROLOL SUCCINATE ER 50 MG PO TB24
50.0000 mg | ORAL_TABLET | Freq: Two times a day (BID) | ORAL | Status: DC
  Administered 2019-05-22 – 2019-05-24 (×6): 50 mg via ORAL
  Filled 2019-05-22 (×6): qty 1

## 2019-05-22 MED ORDER — SILVER SULFADIAZINE 1 % EX CREA
TOPICAL_CREAM | Freq: Two times a day (BID) | CUTANEOUS | Status: DC
  Administered 2019-05-22: 1 via TOPICAL
  Filled 2019-05-22: qty 85

## 2019-05-22 MED ORDER — VANCOMYCIN HCL 1250 MG/250ML IV SOLN
1250.0000 mg | INTRAVENOUS | Status: DC
  Filled 2019-05-22: qty 250

## 2019-05-22 MED ORDER — VANCOMYCIN HCL IN DEXTROSE 1-5 GM/200ML-% IV SOLN: 1000.0000 mg | Freq: Once | INTRAVENOUS | Status: DC

## 2019-05-22 MED ORDER — VANCOMYCIN HCL 2000 MG/400ML IV SOLN
2000.0000 mg | Freq: Once | INTRAVENOUS | Status: AC
  Administered 2019-05-22: 2000 mg via INTRAVENOUS
  Filled 2019-05-22: qty 400

## 2019-05-22 MED ORDER — SENNOSIDES-DOCUSATE SODIUM 8.6-50 MG PO TABS: 2.0000 | ORAL_TABLET | Freq: Every evening | ORAL | Status: DC | PRN

## 2019-05-22 MED ORDER — FUROSEMIDE 10 MG/ML IJ SOLN
40.0000 mg | Freq: Once | INTRAMUSCULAR | Status: AC
  Administered 2019-05-22: 40 mg via INTRAVENOUS
  Filled 2019-05-22: qty 4

## 2019-05-22 MED ORDER — PIPERACILLIN-TAZOBACTAM 3.375 G IVPB 30 MIN
3.3750 g | Freq: Once | INTRAVENOUS | Status: AC
  Administered 2019-05-22: 3.375 g via INTRAVENOUS
  Filled 2019-05-22 (×2): qty 50

## 2019-05-22 NOTE — Progress Notes (Signed)
PROGRESS NOTE    Juan [name]  [id number] DOB: 1965-06-04 DOA: 05/21/2019 PCP: [name], NP (Inactive)   Brief Narrative:  54 year old with history of HTN, DM 2 on insulin pump, CAD status post CABG, microcytic anemia, thrombocytopenia came to the ER with complains of right lower extremity swelling and erythema.  Has history of multiple episodes of cellulitis.   Assessment & Plan:   Principal Problem:   Sepsis ([place]) Active Problems:   Acute kidney injury ([place])   Microcytosis   Thrombocytopenia (HCC)   Hyperlipidemia   CAD (coronary artery disease)   HTN (hypertension)   Cellulitis of right leg   Hyponatremia   Lactic acidosis   Elevated troponin  Sepsis secondary to right lower extremity cellulitis, moderate to severe without pruritus -Sepsis physiology is slowly improving. -Currently on cefepime, broaden coverage to vancomycin and Zosyn for now. -RLE Dopplers-negative -MRI of right lower extremity ordered to rule out deeper infection-mild fasciitis/osteomyelitis  Elevated troponin CAD status post CABG Congestive heart failure with preserved ejection fraction, 65% -Patient is chest pain-free.  Spoke with cardiology team this morning, suspect demand ischemia.  No need for heparin drip at this time especially with worsening platelet count. -Echocardiogram ordered -On aspirin, Plavix, fenofibrate, Zocor  Hyperkalemia, 6.0 -No acute T wave changes, will order Lasix 40 mg IV, normal saline bolus and Lokelma.  Repeat BMP this afternoon.  Acute kidney injury, slowly improving -Baseline creatinine 1.0.  Admission creatinine 2.3, peaked at 2.5 -Trending downwards -Avoid nephrotoxic drugs. -Normal saline at 125 cc/h  Essential hypertension -Currently home medications on hold Due to soft blood pressure home medications are currently on hold  Microcytic anemia, iron deficiency Thrombocytopenia -Iron supplements and bowel regimen   DVT prophylaxis: Not on Lovenox  due to thrombocytopenia Code Status: Full code Family Communication: None Disposition Plan:   Patient From= home  Patient Anticipated D/C place= to be determined  Barriers= unsafe for discharge, undergoing extensive evaluation for his right lower extremity cellulitis and significantly elevated troponin levels.    Subjective: Denies any chest pain this morning.  Reports of right lower extremity discomfort especially with the swelling and tightness.  Review of Systems Otherwise negative except as per HPI, including: General: Denies fever, chills, night sweats or unintended weight loss. Resp: Denies cough, wheezing, shortness of breath. Cardiac: Denies chest pain, palpitations, orthopnea, paroxysmal nocturnal dyspnea. GI: Denies abdominal pain, nausea, vomiting, diarrhea or constipation GU: Denies dysuria, frequency, hesitancy or incontinence MS: Denies muscle aches Neuro: Denies headache, neurologic deficits (focal weakness, numbness, tingling), abnormal gait Psych: Denies anxiety, depression, SI/HI/AVH Skin: Denies new rashes or lesions ID: Denies sick contacts, exotic exposures, travel  Examination:  General exam: Appears calm and comfortable  Respiratory system: Clear to auscultation. Respiratory effort normal. Cardiovascular system: S1 & S2 heard, RRR. No JVD, murmurs, rubs, gallops or clicks. No pedal edema. Gastrointestinal system: Abdomen is nondistended, soft and nontender. No organomegaly or masses felt. Normal bowel sounds heard.  Tense nonpitting edema of right lower extremity with palpable pulse.  Trace edema of left lower extremity. Central nervous system: Alert and oriented. No focal neurological deficits. Extremities: Symmetric 5 x 5 power. Skin: Right lower extremity erythema extending up to his knees, quite tender to touch and warm.  Slight area of ulceration on the back of his heel. Psychiatry: Judgement and insight appear normal. Mood & affect appropriate.      Objective: Vitals:   05/21/19 2316 05/22/19 0421 05/22/19 0427 05/22/19 0815  BP: 115/66 125/66  127/77  Pulse: (!) 103 (!) 105  (!) 108  Resp: 18 17  (!) 32  Temp: 99.4 F (37.4 C) 99.1 F (37.3 C)  98.2 F (36.8 C)  TempSrc: Oral Oral  Oral  SpO2: 97% 99%  99%  Weight:   109.8 kg   Height:        Intake/Output Summary (Last 24 hours) at 05/22/2019 0824 Last data filed at 05/22/2019 [id number] Gross per 24 hour  Intake 2975 ml  Output 1080 ml  Net 1895 ml   Filed Weights   05/21/19 0918 05/21/19 2200 05/22/19 0427  Weight: 105.2 kg 109.8 kg 109.8 kg     Data Reviewed:   CBC: Recent Labs  Lab 05/21/19 0923 05/21/19 1600 05/22/19 0502  WBC 15.9* 13.2* 9.6  NEUTROABS 13.8*  --   --   HGB 13.5 11.9* 11.6*  HCT 40.8 36.6* 35.7*  MCV 78.6* 79.6* 79.3*  PLT 123* 87* 69*   Basic Metabolic Panel: Recent Labs  Lab 05/21/19 0923 05/21/19 1458 05/22/19 0502  NA 132*  --  131*  K 4.8  --  6.0*  CL 100  --  105  CO2 21*  --  20*  GLUCOSE 145*  --  133*  BUN 28*  --  34*  CREATININE 2.31* 2.54* 1.87*  CALCIUM 9.9  --  9.1  MG  --  1.7  --    GFR: Estimated Creatinine Clearance: 57.3 mL/min (A) (by C-G formula based on SCr of 1.87 mg/dL (H)). Liver Function Tests: Recent Labs  Lab 05/21/19 0923  AST 46*  ALT 43  ALKPHOS 50  BILITOT 1.2  PROT 6.6  ALBUMIN 3.3*   No results for input(s): LIPASE, AMYLASE in the last 168 hours. No results for input(s): AMMONIA in the last 168 hours. Coagulation Profile: Recent Labs  Lab 05/21/19 0923  INR 1.0   Cardiac Enzymes: No results for input(s): CKTOTAL, CKMB, CKMBINDEX, TROPONINI in the last 168 hours. BNP (last 3 results) No results for input(s): PROBNP in the last 8760 hours. HbA1C: Recent Labs    05/21/19 1600  HGBA1C 8.5*   CBG: Recent Labs  Lab 05/21/19 1812 05/21/19 2135 05/22/19 0157 05/22/19 0643  GLUCAP 124* 180* 120* 105*   Lipid Profile: No results for input(s): CHOL, HDL, LDLCALC,  TRIG, CHOLHDL, LDLDIRECT in the last 72 hours. Thyroid Function Tests: No results for input(s): TSH, T4TOTAL, FREET4, T3FREE, THYROIDAB in the last 72 hours. Anemia Panel: Recent Labs    05/21/19 2151  FERRITIN 189  TIBC 286  IRON 24*   Sepsis Labs: Recent Labs  Lab 05/21/19 0923 05/21/19 1133  LATICACIDVEN 4.1* 3.7*    Recent Results (from the past 240 hour(s))  Respiratory Panel by RT PCR (Flu A&B, Covid) - Nasopharyngeal Swab     Status: None   Collection Time: 05/21/19  2:35 PM   Specimen: Nasopharyngeal Swab  Result Value Ref Range Status   SARS Coronavirus 2 by RT PCR NEGATIVE NEGATIVE Final    Comment: (NOTE) SARS-CoV-2 target nucleic acids are NOT DETECTED. The SARS-CoV-2 RNA is generally detectable in upper respiratoy specimens during the acute phase of infection. The lowest concentration of SARS-CoV-2 viral copies this assay can detect is 131 copies/mL. A negative result does not preclude SARS-Cov-2 infection and should not be used as the sole basis for treatment or other patient management decisions. A negative result may occur with  improper specimen collection/handling, submission of specimen other than nasopharyngeal swab, presence of viral mutation(s) within the  areas targeted by this assay, and inadequate number of viral copies (<131 copies/mL). A negative result must be combined with clinical observations, patient history, and epidemiological information. The expected result is Negative. Fact Sheet for Patients:  PinkCheek.be Fact Sheet for Healthcare Providers:  GravelBags.it This test is not yet ap proved or cleared by the [place] FDA and  has been authorized for detection and/or diagnosis of SARS-CoV-2 by FDA under an Emergency Use Authorization (EUA). This EUA will remain  in effect (meaning this test can be used) for the duration of the COVID-19 declaration under Section 564(b)(1) of  the Act, 21 U.S.C. section 360bbb-3(b)(1), unless the authorization is terminated or revoked sooner.    Influenza A by PCR NEGATIVE NEGATIVE Final   Influenza B by PCR NEGATIVE NEGATIVE Final    Comment: (NOTE) The Xpert Xpress SARS-CoV-2/FLU/RSV assay is intended as an aid in  the diagnosis of influenza from Nasopharyngeal swab specimens and  should not be used as a sole basis for treatment. Nasal washings and  aspirates are unacceptable for Xpert Xpress SARS-CoV-2/FLU/RSV  testing. Fact Sheet for Patients: PinkCheek.be Fact Sheet for Healthcare Providers: GravelBags.it This test is not yet approved or cleared by the [place] FDA and  has been authorized for detection and/or diagnosis of SARS-CoV-2 by  FDA under an Emergency Use Authorization (EUA). This EUA will remain  in effect (meaning this test can be used) for the duration of the  Covid-19 declaration under Section 564(b)(1) of the Act, 21  U.S.C. section 360bbb-3(b)(1), unless the authorization is  terminated or revoked. Performed at [facility], [place] [address], [place], [place] [address]          Radiology Studies: DG Chest Port 1 View  Result Date: 05/21/2019 CLINICAL DATA:  Fever EXAM: PORTABLE CHEST 1 VIEW COMPARISON:  12/14/2016 FINDINGS: Postop CABG. Negative for heart failure. Lungs remain clear without infiltrate or effusion. No mass or adenopathy. IMPRESSION: No active disease. Electronically Signed   By: Franchot Gallo M.D.   On: 05/21/2019 11:26   VAS [place] LOWER EXTREMITY VENOUS (DVT)  Result Date: 05/21/2019  Lower Venous DVTStudy Indications: Swelling.  Risk Factors: None identified. Limitations: Poor ultrasound/tissue interface. Comparison Study: No prior studies. Performing Technologist: Oliver Hum RVT  Examination Guidelines: A complete evaluation includes B-mode imaging, spectral Doppler, color Doppler, and power Doppler as needed of  all accessible portions of each vessel. Bilateral testing is considered an integral part of a complete examination. Limited examinations for reoccurring indications may be performed as noted. The reflux portion of the exam is performed with the patient in reverse Trendelenburg.  +---------+---------------+---------+-----------+----------+--------------+ RIGHT    CompressibilityPhasicitySpontaneityPropertiesThrombus Aging +---------+---------------+---------+-----------+----------+--------------+ CFV      Full           Yes      Yes                                 +---------+---------------+---------+-----------+----------+--------------+ SFJ      Full                                                        +---------+---------------+---------+-----------+----------+--------------+ FV Prox  Full                                                        +---------+---------------+---------+-----------+----------+--------------+  FV Mid   Full                                                        +---------+---------------+---------+-----------+----------+--------------+ FV DistalFull                                                        +---------+---------------+---------+-----------+----------+--------------+ PFV      Full                                                        +---------+---------------+---------+-----------+----------+--------------+ POP      Full           Yes      Yes                                 +---------+---------------+---------+-----------+----------+--------------+ PTV      Full                                                        +---------+---------------+---------+-----------+----------+--------------+ PERO     Full                                                        +---------+---------------+---------+-----------+----------+--------------+   +----+---------------+---------+-----------+----------+--------------+  LEFTCompressibilityPhasicitySpontaneityPropertiesThrombus Aging +----+---------------+---------+-----------+----------+--------------+ CFV Full           Yes      Yes                                 +----+---------------+---------+-----------+----------+--------------+     Summary: RIGHT: - There is no evidence of deep vein thrombosis in the lower extremity.  - No cystic structure found in the popliteal fossa.  LEFT: - No evidence of common femoral vein obstruction.  *See table(s) above for measurements and observations. Electronically signed by Servando Snare MD on 05/21/2019 at 4:08:18 PM.    Final         Scheduled Meds: . aspirin  81 mg Oral Daily  . clopidogrel  75 mg Oral Q breakfast  . fenofibrate  160 mg Oral Daily  . icosapent Ethyl  2 g Oral BID  . insulin pump   Subcutaneous TID WC, HS, 0200  . simvastatin  10 mg Oral Daily   Continuous Infusions: . sodium chloride 125 mL/hr at 05/22/19 0301  . ceFEPime (MAXIPIME) IV 2 g (05/22/19 0304)     LOS: 1 day   Time spent= 40 mins    Kylina Vultaggio Arsenio Loader, MD [facility]  If 7PM-7AM, please contact night-coverage  05/22/2019, 8:24 AM

## 2019-05-22 NOTE — Progress Notes (Signed)
TED hose ordered by [facility] nurse at [facility]

## 2019-05-22 NOTE — Consult Note (Signed)
[facility] Nurse Consult Note: Reason for Consult: patient with erythema, edema and warmth or RLE, concerning for cellulitis.  Has had numerous episodes of cellulitis in the past. Right heel with fissure and small ulceration. Wound type: infectious Pressure Injury POA: N/A Measurement: Linear fissure measures 3cm in length, circular defect (full thickness) measures 1cm round x 0.4cm. Dry callus surrounds wound and fissure. Red, dry wound bed. Wound bed:As described above Drainage (amount, consistency, odor) None Periwound: as described above Dressing procedure/placement/frequency: Patient endorses history of cellulitis, but states that fissure and "sore" are new. Reports walking without protective shoe wear at home. Topical care will include floatation of heels off of the bed surface, washing of the foot with soap and water twice daily and following with a thin layer application of silver sulfadiazine (silvadene).  Guidance for that care is provided for Nursing via the Orders.  [facility] nursing team will not follow, but will remain available to this patient, the nursing and medical teams.  Please re-consult if needed. Thanks, Maudie Flakes, MSN, RN, [place], Arther Abbott  Pager# [phone]

## 2019-05-22 NOTE — TOC Initial Note (Addendum)
Transition of Care [facility]) - Initial/Assessment Note    Patient Details  Name: Juan [name] MRN: [id number] Date of Birth: 1965-11-26  Transition of [facility] CM/SW Contact:    Pollie Friar, RN Phone Number: 05/22/2019, 2:24 PM  Clinical Narrative:                 Awaiting decision on abx treatment (PO vs IV). Pt has spouse at home that is in and out.  Denies issues with home medications. Denies issues with transportation. TOC following for d/c needs.   Expected Discharge Plan: Home/Self Care Barriers to Discharge: Continued Medical Work up   Patient Goals and CMS Choice        Expected Discharge Plan and Services Expected Discharge Plan: Home/Self Care   Discharge Planning Services: CM Consult   Living arrangements for the past 2 months: Single Family Home                                      Prior Living Arrangements/Services Living arrangements for the past 2 months: Single Family Home Lives with:: Spouse Patient language and need for interpreter reviewed:: Yes Do you feel safe going back to the place where you live?: Yes        Care giver support system in place?: No (comment)(wife not home 24/7 but in and out)   Criminal Activity/Legal Involvement Pertinent to Current Situation/Hospitalization: No - Comment as needed  Activities of Daily Living Home Assistive Devices/Equipment: None ADL Screening (condition at time of admission) Patient's cognitive ability adequate to safely complete daily activities?: Yes Is the patient deaf or have difficulty hearing?: No Does the patient have difficulty seeing, even when wearing glasses/contacts?: No Does the patient have difficulty concentrating, remembering, or making decisions?: No Patient able to express need for assistance with ADLs?: Yes Does the patient have difficulty dressing or bathing?: No Independently performs ADLs?: Yes (appropriate for developmental age) Does the patient have difficulty walking or  climbing stairs?: Yes Weakness of Legs: Right Weakness of Arms/Hands: None  Permission Sought/Granted                  Emotional Assessment Appearance:: Appears stated age   Affect (typically observed): Calm, Blunt, Flat Orientation: : Oriented to Self, Oriented to Place, Oriented to  Time, Oriented to Situation   Psych Involvement: No (comment)  Admission diagnosis:  Cellulitis of right leg [L03.115] AKI (acute kidney injury) ([place]) [N17.9] Sepsis ([place]) [A41.9] ST segment depression [R94.31] Sepsis with acute renal failure without septic shock, due to unspecified organism, unspecified acute renal failure type ([place]) [A41.9, R65.20, N17.9] Patient Active Problem List   Diagnosis Date Noted  . HTN (hypertension)   . Sepsis ([place])   . Cellulitis of right leg   . Hyponatremia   . Lactic acidosis   . Elevated troponin   . Radiculopathy 03/26/2018  . Right bundle branch block 01/07/2018  . Chest pain 12/17/2016  . Diastolic dysfunction [id number]  . Edema of both legs 02/03/2016  . Dyspnea on exertion 02/03/2016  . S/P CABG x 4 02/06/2013  . CAD (coronary artery disease) 02/04/2013  . Other malignant lymphomas, unspecified site, extranodal and solid organ sites 08/25/2012  . Personal history of lymphatic and hematopoietic neoplasm 06/05/2012  . Personal history of digestive disease 06/05/2012  . Hyperlipidemia 06/04/2012  . Chronic nonalcoholic liver disease [id number]  . Thrombocytopenia ([place]) 06/17/2011  . Chronic  pancreatitis ([place]) 06/16/2011  . Dyslipidemia 06/16/2011  . Hyperkalemia 06/16/2011  . Acute kidney injury ([place]) 06/16/2011  . Dehydration 06/16/2011  . Type 2 diabetes mellitus treated with insulin ([place]) 06/16/2011  . Microcytosis 06/16/2011  . Fatty liver disease, nonalcoholic [id number]  . Bilateral renal cysts 06/16/2011  . Splenomegaly 06/16/2011   PCP:  [name], NP (Inactive) Pharmacy:   [id number] - [place], [place] [address] [place] [address] Phone: [phone] Fax: [phone]     Social Determinants of Health (SDOH) Interventions    Readmission Risk Interventions No flowsheet data found.

## 2019-05-22 NOTE — Progress Notes (Signed)
Inpatient Diabetes Program Recommendations  AACE/ADA: New Consensus Statement on Inpatient Glycemic Control (2015)  Target Ranges:  Prepandial:   less than 140 mg/dL      Peak postprandial:   less than 180 mg/dL (1-2 hours)      Critically ill patients:  140 - 180 mg/dL   Lab Results  Component Value Date   GLUCAP 112 (H) 05/22/2019   HGBA1C 8.5 (H) 05/21/2019    Review of Glycemic Control Results for Juan [name], Juan [name] (MRN [id number]) as of 05/22/2019 14:41  Ref. Range 05/21/2019 21:35 05/22/2019 01:57 05/22/2019 06:43 05/22/2019 11:42  Glucose-Capillary Latest Ref Range: 70 - 99 mg/dL 180 (H) 120 (H) 105 (H) 112 (H)   Diabetes history: Type 2 DM Outpatient Diabetes medications: U-500 via Medtronic insulin pump, Farxiga 5 mg QD Current orders for Inpatient glycemic control: insulin pump TID, HS, 0200  Inpatient Diabetes Program Recommendations:    Trending well.   Current insulin pump settings are as follows:  Basal insulin  0000-0400 2.70 units/hour 0401-0700 2.85 units/hour 0701-2200 2.95 units/hour 2201-0000 2.80 units/hour Total daily basal insulin: 59.15 units of U-500 insulin (295.75 total units)/24 hours  Carb Coverage 0000-0600  1:4.8 1 unit for every 4.8 grams of carbohydrates 0601-1830  1:4.0 1 unit for every 4.0 grams of carbohydrates 1831-0000  1:5.0 1 unit for every 5.0 grams of carbohydrates  Target Glucose Goals 0000-0000 130 mg/dl   Spoke with patient regarding insulin pump settings. Verified pump placed and working. Patient is seen by Dr Chalmers Cater, outpatient endocrinology. Reports that it has been "awhile" since his last appointment. Reviewed patient's current A1c of 8.7%. Explained what a A1c is and what it measures. Also reviewed goal A1c with patient, importance of good glucose control @ home, and blood sugar goals. Reviewed patho of infection, role of pancreas, vascular changes and commorbidities. Encouraged to make another appointment with Dr Chalmers Cater in the next few  weeks; patient agreed. Patient verbalized understanding of information discussed and states that he does not have any further questions related to diabetes at this time.  NURSING: Once insulin pump order set is ordered please print off the Patient insulin pump contract and flow sheet. The insulin pump contract should be signed by the patient and then placed in the chart. The patient insulin pump flow sheet will be completed by the patient at the bedside and the RN caring for the patient will use the patient's flow sheet to document in the [facility]. RN will need to complete the Nursing Insulin Pump Flowsheet at least once a shift. Patient will need to keep extra insulin pump supplies at the bedside at all times.   Thanks, Bronson Curb, MSN, RNC-OB Diabetes Coordinator [phone] (8a-5p)

## 2019-05-22 NOTE — Progress Notes (Signed)
Cardiology Progress Note  Patient ID: Juan [name] MRN: [id number] DOB: [birth year] Date of Encounter: 05/22/2019  Primary Cardiologist: Quay Burow, MD  Subjective  Doing well this morning.  EKG appears more like old EKG.  Acute dynamic changes have resolved.  He denies chest pain or shortness of breath this morning.  He reports he would like to go home.  ROS:  All other ROS reviewed and negative. Pertinent positives noted in the HPI.     Inpatient Medications  Scheduled Meds:  aspirin  81 mg Oral Daily   clopidogrel  75 mg Oral Q breakfast   fenofibrate  160 mg Oral Daily   icosapent Ethyl  2 g Oral BID   insulin pump   Subcutaneous TID WC, HS, 0200   simvastatin  10 mg Oral Daily   Continuous Infusions:  sodium chloride 125 mL/hr at 05/22/19 0301   ceFEPime (MAXIPIME) IV 2 g (05/22/19 0304)   PRN Meds: acetaminophen **OR** acetaminophen, nitroGLYCERIN, ondansetron **OR** ondansetron (ZOFRAN) IV   Vital Signs   Vitals:   05/21/19 2316 05/22/19 0421 05/22/19 0427 05/22/19 0815  BP: 115/66 125/66  127/77  Pulse: (!) 103 (!) 105  (!) 108  Resp: 18 17  (!) 32  Temp: 99.4 F (37.4 C) 99.1 F (37.3 C)  98.2 F (36.8 C)  TempSrc: Oral Oral  Oral  SpO2: 97% 99%  99%  Weight:   109.8 kg   Height:        Intake/Output Summary (Last 24 hours) at 05/22/2019 0829 Last data filed at 05/22/2019 [id number] Gross per 24 hour  Intake 2975 ml  Output 1080 ml  Net 1895 ml   Last 3 Weights 05/22/2019 05/21/2019 05/21/2019  Weight (lbs) 242 lb 1 oz 242 lb 1 oz 232 lb  Weight (kg) 109.8 kg 109.8 kg 105.235 kg      Telemetry  No telemetry overnight  ECG  The most recent ECG shows sinus tachycardia, heart rate 104, right bundle branch block with left anterior fascicular block, minimal ST depressions in anterolateral leads, much improved from yesterday, which I personally reviewed.   Physical Exam   Vitals:   05/21/19 2316 05/22/19 0421 05/22/19 0427 05/22/19 0815  BP: 115/66  125/66  127/77  Pulse: (!) 103 (!) 105  (!) 108  Resp: 18 17  (!) 32  Temp: 99.4 F (37.4 C) 99.1 F (37.3 C)  98.2 F (36.8 C)  TempSrc: Oral Oral  Oral  SpO2: 97% 99%  99%  Weight:   109.8 kg   Height:         Intake/Output Summary (Last 24 hours) at 05/22/2019 0829 Last data filed at 05/22/2019 [id number] Gross per 24 hour  Intake 2975 ml  Output 1080 ml  Net 1895 ml    Last 3 Weights 05/22/2019 05/21/2019 05/21/2019  Weight (lbs) 242 lb 1 oz 242 lb 1 oz 232 lb  Weight (kg) 109.8 kg 109.8 kg 105.235 kg    Body mass index is 34.73 kg/m.  General: Well nourished, well developed, in no acute distress Head: Atraumatic, normal size  Eyes: PEERLA, EOMI  Neck: Supple, no JVD Endocrine: No thryomegaly Cardiac: Normal S1, S2; RRR; no murmurs, rubs, or gallops Lungs: Clear to auscultation bilaterally, no wheezing, rhonchi or rales  Abd: Soft, nontender, no hepatomegaly  Ext: Right lower extremity is swollen and edematous, erythematous changes noted, right heel ulcerative changes Musculoskeletal: No deformities, BUE and BLE strength normal and equal Skin: Erythematous swollen right lower extremity with  ulcerative changes of the right heel Neuro: Alert and oriented to person, place, time, and situation, CNII-XII grossly intact, no focal deficits  Psych: Normal mood and affect   Labs  High Sensitivity Troponin:   Recent Labs  Lab 05/21/19 1458 05/21/19 1600  TROPONINIHS 4,494* 4,814*     Cardiac EnzymesNo results for input(s): TROPONINI in the last 168 hours. No results for input(s): TROPIPOC in the last 168 hours.  Chemistry Recent Labs  Lab 05/21/19 0923 05/21/19 1458 05/22/19 0502  NA 132*  --  131*  K 4.8  --  6.0*  CL 100  --  105  CO2 21*  --  20*  GLUCOSE 145*  --  133*  BUN 28*  --  34*  CREATININE 2.31* 2.54* 1.87*  CALCIUM 9.9  --  9.1  PROT 6.6  --   --   ALBUMIN 3.3*  --   --   AST 46*  --   --   ALT 43  --   --   ALKPHOS 50  --   --   BILITOT 1.2  --   --     GFRNONAA 31* 28* 40*  GFRAA 36* 32* 47*  ANIONGAP 11  --  6    Hematology Recent Labs  Lab 05/21/19 0923 05/21/19 1600 05/22/19 0502  WBC 15.9* 13.2* 9.6  RBC 5.19 4.60 4.50  HGB 13.5 11.9* 11.6*  HCT 40.8 36.6* 35.7*  MCV 78.6* 79.6* 79.3*  MCH 26.0 25.9* 25.8*  MCHC 33.1 32.5 32.5  RDW 16.7* 16.9* 17.1*  PLT 123* 87* 69*   BNPNo results for input(s): BNP, PROBNP in the last 168 hours.  DDimer No results for input(s): DDIMER in the last 168 hours.   Radiology  DG Chest Port 1 View  Result Date: 05/21/2019 CLINICAL DATA:  Fever EXAM: PORTABLE CHEST 1 VIEW COMPARISON:  12/14/2016 FINDINGS: Postop CABG. Negative for heart failure. Lungs remain clear without infiltrate or effusion. No mass or adenopathy. IMPRESSION: No active disease. Electronically Signed   By: Franchot Gallo M.D.   On: 05/21/2019 11:26   VAS [place] LOWER EXTREMITY VENOUS (DVT)  Result Date: 05/21/2019  Lower Venous DVTStudy Indications: Swelling.  Risk Factors: None identified. Limitations: Poor ultrasound/tissue interface. Comparison Study: No prior studies. Performing Technologist: Oliver Hum RVT  Examination Guidelines: A complete evaluation includes B-mode imaging, spectral Doppler, color Doppler, and power Doppler as needed of all accessible portions of each vessel. Bilateral testing is considered an integral part of a complete examination. Limited examinations for reoccurring indications may be performed as noted. The reflux portion of the exam is performed with the patient in reverse Trendelenburg.  +---------+---------------+---------+-----------+----------+--------------+  RIGHT     Compressibility Phasicity Spontaneity Properties Thrombus Aging  +---------+---------------+---------+-----------+----------+--------------+  CFV       Full            Yes       Yes                                    +---------+---------------+---------+-----------+----------+--------------+  SFJ       Full                                                              +---------+---------------+---------+-----------+----------+--------------+  FV Prox   Full                                                             +---------+---------------+---------+-----------+----------+--------------+  FV Mid    Full                                                             +---------+---------------+---------+-----------+----------+--------------+  FV Distal Full                                                             +---------+---------------+---------+-----------+----------+--------------+  PFV       Full                                                             +---------+---------------+---------+-----------+----------+--------------+  POP       Full            Yes       Yes                                    +---------+---------------+---------+-----------+----------+--------------+  PTV       Full                                                             +---------+---------------+---------+-----------+----------+--------------+  PERO      Full                                                             +---------+---------------+---------+-----------+----------+--------------+   +----+---------------+---------+-----------+----------+--------------+  LEFT Compressibility Phasicity Spontaneity Properties Thrombus Aging  +----+---------------+---------+-----------+----------+--------------+  CFV  Full            Yes       Yes                                    +----+---------------+---------+-----------+----------+--------------+     Summary: RIGHT: - There is no evidence of deep vein thrombosis in the lower extremity.  - No cystic structure found in the popliteal fossa.  LEFT: - No evidence of common femoral vein obstruction.  *See table(s) above for measurements and observations. Electronically signed by Servando Snare MD on 05/21/2019 at  4:08:18 PM.    Final     Cardiac Studies  LHC 12/17/2016  Prox RCA to Mid RCA lesion, 80  %stenosed.  Mid RCA lesion, 80 %stenosed.  Dist RCA lesion, 95 %stenosed.  Prox Cx lesion, 95 %stenosed.  Ost 1st Mrg to 1st Mrg lesion, 95 %stenosed.  Ost Cx to Prox Cx lesion, 70 %stenosed.  Mid Cx to Dist Cx lesion, 80 %stenosed.  Ost 1st Diag to 1st Diag lesion, 90 %stenosed.  Ost 2nd Diag to 2nd Diag lesion, 90 %stenosed.  Mid LAD to Dist LAD lesion, 90 %stenosed.  Dist LAD lesion, 100 %stenosed.  LIMA.  Left radial artery.  SVG.  Origin to Prox Graft lesion, 100 %stenosed.  SVG and is large and anatomically normal.  Mid Graft lesion, 80 %stenosed.  Prox Graft lesion, 80 %stenosed.  Post intervention, there is a 0% residual stenosis.  A stent was successfully placed.  Dist Graft to Insertion lesion, 95 %stenosed.  Post intervention, there is a 0% residual stenosis.  A stent was successfully placed.  The left ventricular systolic function is normal.  LV end diastolic pressure is normal.  The left ventricular ejection fraction is 50-55% by visual estimate.  Patient Profile  Juan [name] is a 54 y.o. male with hx of hypertriglyceridemia (multiple issues with pancreatitis in the past, most recent triglycerides about 5000), CAD status post CABG, diabetes, hypertension  who was admitted on 05/21/2019 for severe sepsis secondary to right lower extremity cellulitis.  Cardiology consulted for elevated troponin as well as dynamic EKG changes.  Assessment & Plan   1.  Demand ischemia in the setting of severe sepsis versus non-STEMI -Admitted with severe sepsis, hypotension and lactic acidosis secondary to right lower extremity cellulitis.  Troponin elevated up to 4494 and up to 4800 on repeat.  EKG yesterday showed chronic right bundle branch block/left anterior fascicular block with anterolateral ST depressions.  He reports no chest pain and had none yesterday.  His EKG today shows resolution of those dynamic ST depression changes. -He had no chest pain yesterday  and none today -He has known residual disease in the left circumflex territory that is not amenable to PCI.  I highly suspect he just had demand ischemia in the setting of severe sepsis.  This is not appear to represent ACS. -He is notably thrombocytopenic yesterday and again this morning.  We held heparin in this setting.  I think this is okay for now.  He has no chest pain and EKG changes are resolving. -We have continue his aspirin and Plavix.  I think this is okay for now.  She displays continue to drop we will need to reconsider holding this.  For now I think it is okay. -We will repeat an echocardiogram today.  If unchanged likely will not need further evaluation.  2.  CAD status post CABG -Blood pressure much improved today as sepsis has been treated.  I have added back his metoprolol succinate.  We will need to add back his ACE inhibitor as well as Imdur as able. -We will continue his aspirin Plavix as above.  We will be mindful of his platelets. -We will continue his statin and fenofibrate  3.  Hypertriglyceridemia -Very elevated hypertriglycerides.  Uncontrolled diabetes. -No symptoms of pancreatitis.  We will continue his statin and fenofibrate.  For questions or updates, please contact [place] Please consult [email or web address] for contact info under   Signed, [place] T. Audie Box, [place]  05/22/2019 8:29 AM

## 2019-05-22 NOTE — Progress Notes (Signed)
  Echocardiogram 2D Echocardiogram has been performed.  Juan [name] 05/22/2019, 10:21 AM

## 2019-05-22 NOTE — Progress Notes (Signed)
Potassium 6.0, [place] informed.

## 2019-05-22 NOTE — Progress Notes (Signed)
Pharmacy Antibiotic Note  Juan [name] is a 54 y.o. male admitted on 05/21/2019 with sepsis and cellulitis.  Hx multiple episodes of cellulitis. Pharmacy has been consulted for vancomycin and Zosyn dosing; cefepime has been discontinued. Tm 100.9, WBC down to normal. AKI improving and SCr trending down. MRI pending to r/o deeper infection.  Plan: D/C cefepime Zosyn 3.375 g IV q8h Vancomycin 2000 mg IV load then 1250 mg IV q24h Goal AUC 400-550. Expected AUC: 517.5 SCr used: 1.87 Monitor renal function, clinical progress, cultures/sensitivities F/U LOT and de-escalate as able Vancomycin levels as clinically indicated    Height: 5\' 10"  (177.8 cm) Weight: 242 lb 1 oz (109.8 kg) IBW/kg (Calculated) : 73  Temp (24hrs), Avg:99.4 F (37.4 C), Min:98.2 F (36.8 C), Max:100.9 F (38.3 C)  Recent Labs  Lab 05/21/19 0923 05/21/19 1133 05/21/19 1458 05/21/19 1600 05/22/19 0502  WBC 15.9*  --   --  13.2* 9.6  CREATININE 2.31*  --  2.54*  --  1.87*  LATICACIDVEN 4.1* 3.7*  --   --   --     Estimated Creatinine Clearance: 57.3 mL/min (A) (by C-G formula based on SCr of 1.87 mg/dL (H)).    Allergies  Allergen Reactions  . Reglan [Metoclopramide] Other (See Comments)    Only can tolerate in low doses, in higher doses it has the opposite effect    Antimicrobials this admission: Cefepime 3/4 >> 3/5 CTX x 1 3/4 Vanc 3/5 >> Zosyn 3/5 >>  Dose adjustments this admission:   Microbiology results: 3/4 UCx: pending 3/4 BCx: ngtd  Thank you for involving pharmacy in this patient's care.  Renold Genta, PharmD, BCPS Clinical Pharmacist Clinical phone for 05/22/2019 until 3p is [phone] 05/22/2019 11:42 AM  **Pharmacist phone directory can be found on [email or web address] listed under [place]**

## 2019-05-22 NOTE — Progress Notes (Signed)
Verified with Lab that Potassium has been normal twice as ordered. D/c lab because of order.

## 2019-05-23 DIAGNOSIS — D696 Thrombocytopenia, unspecified: Secondary | ICD-10-CM

## 2019-05-23 DIAGNOSIS — E781 Pure hyperglyceridemia: Secondary | ICD-10-CM

## 2019-05-23 LAB — COMPREHENSIVE METABOLIC PANEL WITH GFR
ALT: 47 U/L — ABNORMAL HIGH (ref 0–44)
AST: 56 U/L — ABNORMAL HIGH (ref 15–41)
Albumin: 2.6 g/dL — ABNORMAL LOW (ref 3.5–5.0)
Alkaline Phosphatase: 63 U/L (ref 38–126)
Anion gap: 10 (ref 5–15)
BUN: 30 mg/dL — ABNORMAL HIGH (ref 6–20)
CO2: 18 mmol/L — ABNORMAL LOW (ref 22–32)
Calcium: 10 mg/dL (ref 8.9–10.3)
Chloride: 104 mmol/L (ref 98–111)
Creatinine, Ser: 1.49 mg/dL — ABNORMAL HIGH (ref 0.61–1.24)
GFR calc Af Amer: >60 mL/min
GFR calc non Af Amer: 53 mL/min — ABNORMAL LOW
Glucose, Bld: 85 mg/dL (ref 70–99)
Potassium: 4.2 mmol/L (ref 3.5–5.1)
Sodium: 132 mmol/L — ABNORMAL LOW (ref 135–145)
Total Bilirubin: 0.9 mg/dL (ref 0.3–1.2)
Total Protein: 6.8 g/dL (ref 6.5–8.1)

## 2019-05-23 LAB — CBC
HCT: 34.3 % — ABNORMAL LOW (ref 39.0–52.0)
Hemoglobin: 11.2 g/dL — ABNORMAL LOW (ref 13.0–17.0)
MCH: 25.2 pg — ABNORMAL LOW (ref 26.0–34.0)
MCHC: 32.7 g/dL (ref 30.0–36.0)
MCV: 77.3 fL — ABNORMAL LOW (ref 80.0–100.0)
Platelets: 91 10*3/uL — ABNORMAL LOW (ref 150–400)
RBC: 4.44 MIL/uL (ref 4.22–5.81)
RDW: 17.2 % — ABNORMAL HIGH (ref 11.5–15.5)
WBC: 11.8 10*3/uL — ABNORMAL HIGH (ref 4.0–10.5)
nRBC: 0 % (ref 0.0–0.2)

## 2019-05-23 LAB — GLUCOSE, CAPILLARY
Glucose-Capillary: 78 mg/dL (ref 70–99)
Glucose-Capillary: 79 mg/dL (ref 70–99)
Glucose-Capillary: 80 mg/dL (ref 70–99)
Glucose-Capillary: 76 mg/dL (ref 70–99)
Glucose-Capillary: 95 mg/dL (ref 70–99)

## 2019-05-23 LAB — C DIFFICILE QUICK SCREEN W PCR REFLEX
C Diff antigen: NEGATIVE
C Diff interpretation: NOT DETECTED
C Diff toxin: NEGATIVE

## 2019-05-23 LAB — MAGNESIUM: Magnesium: 1.9 mg/dL (ref 1.7–2.4)

## 2019-05-23 MED ORDER — VANCOMYCIN HCL 1500 MG/300ML IV SOLN
1500.0000 mg | INTRAVENOUS | Status: DC
  Administered 2019-05-23 – 2019-05-25 (×3): 1500 mg via INTRAVENOUS
  Filled 2019-05-23 (×3): qty 300

## 2019-05-23 MED ORDER — ROSUVASTATIN CALCIUM 20 MG PO TABS
20.0000 mg | ORAL_TABLET | Freq: Every day | ORAL | Status: DC
  Administered 2019-05-23 – 2019-05-24 (×2): 20 mg via ORAL
  Filled 2019-05-23 (×2): qty 1

## 2019-05-23 MED ORDER — FUROSEMIDE 10 MG/ML IJ SOLN
40.0000 mg | Freq: Once | INTRAMUSCULAR | Status: AC
  Administered 2019-05-23: 40 mg via INTRAVENOUS
  Filled 2019-05-23: qty 4

## 2019-05-23 NOTE — Progress Notes (Signed)
Pharmacy Antibiotic Note  Juan [name] is a 54 y.o. male admitted on 05/21/2019 with sepsis and cellulitis.  Hx multiple episodes of cellulitis. Pharmacy has been consulted for vancomycin and Zosyn dosing; cefepime has been discontinued. Tm 99.5, WBC up to 11.8. AKI improving and SCr trending down, now 1.49. MRI pending to r/o deeper infection.  Plan: Zosyn 3.375 g IV q8h Change vancomycin to 1500 mg IV q24h for improved renal function Goal AUC 400-550. Expected AUC: 505 SCr used: 1.49 Monitor renal function, clinical progress, cultures/sensitivities F/U LOT and de-escalate as able Vancomycin levels as clinically indicated    Height: 5\' 10"  (177.8 cm) Weight: 242 lb 1 oz (109.8 kg) IBW/kg (Calculated) : 73  Temp (24hrs), Avg:98.7 F (37.1 C), Min:97.9 F (36.6 C), Max:99.5 F (37.5 C)  Recent Labs  Lab 05/21/19 0923 05/21/19 1133 05/21/19 1458 05/21/19 1600 05/22/19 0502 05/22/19 1503 05/23/19 0500  WBC 15.9*  --   --  13.2* 9.6  --  11.8*  CREATININE 2.31*  --  2.54*  --  1.87* 1.62* 1.49*  LATICACIDVEN 4.1* 3.7*  --   --   --   --   --     Estimated Creatinine Clearance: 71.9 mL/min (A) (by C-G formula based on SCr of 1.49 mg/dL (H)).    Allergies  Allergen Reactions  . Reglan [Metoclopramide] Other (See Comments)    Only can tolerate in low doses, in higher doses it has the opposite effect    Antimicrobials this admission: Cefepime 3/4 >> 3/5 CTX x 1 3/4 Vanc 3/5 >> Zosyn 3/5 >>  Dose adjustments this admission:   Microbiology results: 3/4 UCx: neg 3/4 BCx: ngtd  Thank you for involving pharmacy in this patient's care.  Renold Genta, PharmD, BCPS Clinical Pharmacist Clinical phone for 05/23/2019 until 3p is [phone] 05/23/2019 10:06 AM  **Pharmacist phone directory can be found on [email or web address] listed under [place]**

## 2019-05-23 NOTE — Progress Notes (Signed)
PROGRESS NOTE    Juan [name]  [id number] DOB: 1965-07-07 DOA: 05/21/2019 PCP: [name], NP (Inactive)   Brief Narrative:  54 year old with history of HTN, DM 2 on insulin pump, CAD status post CABG, microcytic anemia, thrombocytopenia came to the ER with complains of right lower extremity swelling and erythema.  Has history of multiple episodes of cellulitis.   Assessment & Plan:   Principal Problem:   Sepsis ([place]) Active Problems:   Acute kidney injury ([place])   Microcytosis   Thrombocytopenia ([facility])   Hyperlipidemia   CAD (coronary artery disease)   HTN (hypertension)   Cellulitis of right leg   Hyponatremia   Lactic acidosis   Elevated troponin  Sepsis secondary to right lower extremity cellulitis, moderate to severe without pruritus -Sepsis physiology is slowly improving. -Currently on cefepime, broaden coverage to vancomycin and Zosyn for now. -RLE Dopplers-negative -Patient unable to get MRI right lower extremity yesterday due to feeling of orthopnea.  He is agreeable to get this done today.  We need to rule out my fasciitis/osteomyelitis.  Acute mild respiratory distress with orthopnea -Suspect some fluid overload, IV fluids stopped.  Lasix 40 mg IV once.  Elevated troponin CAD status post CABG Congestive heart failure with preserved ejection fraction, 65% -Patient is chest pain-free.  Spoke with cardiology team this morning, suspect demand ischemia.  No need for heparin drip at this time especially with worsening platelet count. -Echocardiogram-EF 45-50%, global hypokinesis, grade 1 diastolic dysfunction. -On aspirin, Plavix, fenofibrate, Zocor  Nonspecific diarrhea -Check C. difficile studies.  Hyperkalemia, 6.0 -Resolved.  Sodium levels now 4.2.  Acute kidney injury, improved -Baseline creatinine 1.0.  Admission creatinine 2.3, peaked at 2.5 -Stop IV fluids due to signs of volume overload.  Creatinine down to 1.49.  Essential  hypertension -Stable, will slowly resume  Microcytic anemia, iron deficiency Thrombocytopenia -Iron supplements.  Bowel regimen held due to diarrhea   DVT prophylaxis: Not on Lovenox due to thrombocytopenia Code Status: Full code Family Communication: None Disposition Plan:   Patient From= home  Patient Anticipated D/C place= to be determined  Barriers= unsafe for discharge due to significant amount of right lower extremity erythema and orthopnea.    Subjective: Report right lower extremity feels slightly better and less tight after getting antibiotics.  Has had at least 5-7 episodes of watery stool over last 24 hours without taking any laxatives.  Review of Systems Otherwise negative except as per HPI, including: General = no fevers, chills, dizziness,  fatigue HEENT/EYES = negative for loss of vision, double vision, blurred vision,  sore throa Cardiovascular= negative for chest pain, palpitation Respiratory/lungs= negative for , cough, wheezing; hemoptysis,  Gastrointestinal= negative forabdominal pain Genitourinary= negative for Dysuria MSK = Negative for arthralgia, myalgias Neurology= Negative for headache, numbness, tingling  Psychiatry= Negative for suicidal and homocidal ideation Skin= Negative for Rash   Examination: Constitutional: Not in acute distress Respiratory: Bibasilar crackles Cardiovascular: Normal sinus rhythm, no rubs Abdomen: Nontender nondistended good bowel sounds Musculoskeletal: No edema noted Skin: Right lower extremity erythema noted extending up to his knees.  Small area of ulceration on the right heel. Neurologic: CN 2-12 grossly intact.  And nonfocal Psychiatric: Normal judgment and insight. Alert and oriented x 3. Normal mood.     Objective: Vitals:   05/22/19 2323 05/23/19 0315 05/23/19 0751 05/23/19 1124  BP: 138/86 127/77 125/78 117/74  Pulse: (!) 105 (!) 103 98 85  Resp: 18 18 18 18   Temp: 99.3 F (37.4 C) 99 F (37.2  C) 99.5  F (37.5 C) 99.8 F (37.7 C)  TempSrc: Oral Oral Oral Oral  SpO2: 96% 98% 94% 99%  Weight:      Height:        Intake/Output Summary (Last 24 hours) at 05/23/2019 1144 Last data filed at 05/23/2019 1046 Gross per 24 hour  Intake 2026.62 ml  Output 2275 ml  Net -248.38 ml   Filed Weights   05/21/19 0918 05/21/19 2200 05/22/19 0427  Weight: 105.2 kg 109.8 kg 109.8 kg     Data Reviewed:   CBC: Recent Labs  Lab 05/21/19 0923 05/21/19 1600 05/22/19 0502 05/23/19 0500  WBC 15.9* 13.2* 9.6 11.8*  NEUTROABS 13.8*  --   --   --   HGB 13.5 11.9* 11.6* 11.2*  HCT 40.8 36.6* 35.7* 34.3*  MCV 78.6* 79.6* 79.3* 77.3*  PLT 123* 87* 69* 91*   Basic Metabolic Panel: Recent Labs  Lab 05/21/19 0923 05/21/19 1458 05/22/19 0502 05/22/19 1503 05/22/19 1614 05/23/19 0500  NA 132*  --  131* 131*  --  132*  K 4.8  --  6.0* 4.4 4.6 4.2  CL 100  --  105 102  --  104  CO2 21*  --  20* 18*  --  18*  GLUCOSE 145*  --  133* 100*  --  85  BUN 28*  --  34* 35*  --  30*  CREATININE 2.31* 2.54* 1.87* 1.62*  --  1.49*  CALCIUM 9.9  --  9.1 9.7  --  10.0  MG  --  1.7  --   --   --  1.9   GFR: Estimated Creatinine Clearance: 71.9 mL/min (A) (by C-G formula based on SCr of 1.49 mg/dL (H)). Liver Function Tests: Recent Labs  Lab 05/21/19 0923 05/23/19 0500  AST 46* 56*  ALT 43 47*  ALKPHOS 50 63  BILITOT 1.2 0.9  PROT 6.6 6.8  ALBUMIN 3.3* 2.6*   No results for input(s): LIPASE, AMYLASE in the last 168 hours. No results for input(s): AMMONIA in the last 168 hours. Coagulation Profile: Recent Labs  Lab 05/21/19 0923  INR 1.0   Cardiac Enzymes: No results for input(s): CKTOTAL, CKMB, CKMBINDEX, TROPONINI in the last 168 hours. BNP (last 3 results) No results for input(s): PROBNP in the last 8760 hours. HbA1C: Recent Labs    05/21/19 1600  HGBA1C 8.5*   CBG: Recent Labs  Lab 05/22/19 1524 05/22/19 2239 05/23/19 0251 05/23/19 0611 05/23/19 1124  GLUCAP 96 91 78 79 80    Lipid Profile: No results for input(s): CHOL, HDL, LDLCALC, TRIG, CHOLHDL, LDLDIRECT in the last 72 hours. Thyroid Function Tests: No results for input(s): TSH, T4TOTAL, FREET4, T3FREE, THYROIDAB in the last 72 hours. Anemia Panel: Recent Labs    05/21/19 2151 05/22/19 1026  VITAMINB12  --  524  FERRITIN 189  --   TIBC 286  --   IRON 24*  --    Sepsis Labs: Recent Labs  Lab 05/21/19 0923 05/21/19 1133  LATICACIDVEN 4.1* 3.7*    Recent Results (from the past 240 hour(s))  Culture, blood (Routine x 2)     Status: None (Preliminary result)   Collection Time: 05/21/19  9:20 AM   Specimen: BLOOD  Result Value Ref Range Status   Specimen Description BLOOD LEFT ANTECUBITAL  Final   Special Requests   Final    BOTTLES DRAWN AEROBIC AND ANAEROBIC Blood Culture adequate volume   Culture   Final  NO GROWTH 2 DAYS Performed at [facility], [place] [address], [place], [place] [address]    Report Status PENDING  Incomplete  Culture, blood (Routine x 2)     Status: None (Preliminary result)   Collection Time: 05/21/19 11:37 AM   Specimen: BLOOD  Result Value Ref Range Status   Specimen Description BLOOD RIGHT ANTECUBITAL  Final   Special Requests   Final    BOTTLES DRAWN AEROBIC AND ANAEROBIC Blood Culture adequate volume   Culture   Final    NO GROWTH 2 DAYS Performed at [facility], [place] [address], [place],  [address]    Report Status PENDING  Incomplete  Respiratory Panel by RT PCR (Flu A&B, Covid) - Nasopharyngeal Swab     Status: None   Collection Time: 05/21/19  2:35 PM   Specimen: Nasopharyngeal Swab  Result Value Ref Range Status   SARS Coronavirus 2 by RT PCR NEGATIVE NEGATIVE Final    Comment: (NOTE) SARS-CoV-2 target nucleic acids are NOT DETECTED. The SARS-CoV-2 RNA is generally detectable in upper respiratoy specimens during the acute phase of infection. The lowest concentration of SARS-CoV-2 viral copies this assay can detect is 131  copies/mL. A negative result does not preclude SARS-Cov-2 infection and should not be used as the sole basis for treatment or other patient management decisions. A negative result may occur with  improper specimen collection/handling, submission of specimen other than nasopharyngeal swab, presence of viral mutation(s) within the areas targeted by this assay, and inadequate number of viral copies (<131 copies/mL). A negative result must be combined with clinical observations, patient history, and epidemiological information. The expected result is Negative. Fact Sheet for Patients:  PinkCheek.be Fact Sheet for Healthcare Providers:  GravelBags.it This test is not yet ap proved or cleared by the [place] FDA and  has been authorized for detection and/or diagnosis of SARS-CoV-2 by FDA under an Emergency Use Authorization (EUA). This EUA will remain  in effect (meaning this test can be used) for the duration of the COVID-19 declaration under Section 564(b)(1) of the Act, 21 U.S.C. section 360bbb-3(b)(1), unless the authorization is terminated or revoked sooner.    Influenza A by PCR NEGATIVE NEGATIVE Final   Influenza B by PCR NEGATIVE NEGATIVE Final    Comment: (NOTE) The Xpert Xpress SARS-CoV-2/FLU/RSV assay is intended as an aid in  the diagnosis of influenza from Nasopharyngeal swab specimens and  should not be used as a sole basis for treatment. Nasal washings and  aspirates are unacceptable for Xpert Xpress SARS-CoV-2/FLU/RSV  testing. Fact Sheet for Patients: PinkCheek.be Fact Sheet for Healthcare Providers: GravelBags.it This test is not yet approved or cleared by the [place] FDA and  has been authorized for detection and/or diagnosis of SARS-CoV-2 by  FDA under an Emergency Use Authorization (EUA). This EUA will remain  in effect (meaning this test can  be used) for the duration of the  Covid-19 declaration under Section 564(b)(1) of the Act, 21  U.S.C. section 360bbb-3(b)(1), unless the authorization is  terminated or revoked. Performed at [facility], [place] [address] [place],  [address]   Urine culture     Status: None   Collection Time: 05/21/19  7:20 PM   Specimen: In/Out Cath Urine  Result Value Ref Range Status   Specimen Description IN/OUT CATH URINE  Final   Special Requests NONE  Final   Culture   Final    NO GROWTH Performed at [facility], [place] [address], [place], [place] [address]    Report Status 05/22/2019 FINAL  Final         Radiology Studies: ECHOCARDIOGRAM COMPLETE  Result Date: 05/22/2019    ECHOCARDIOGRAM REPORT   Patient Name:   Aviraj SEBERT ADDLEMAN Date of Exam: 05/22/2019 Medical Rec #:  [id number]     Height:       70.0 in Accession #:    [id number]    Weight:       242.1 lb Date of Birth:  1965-04-30     BSA:          2.264 m Patient Age:    34 years      BP:           125/66 mmHg Patient Gender: M             HR:           106 bpm. Exam Location:  Inpatient Procedure: 2D Echo, 3D Echo, Cardiac Doppler and Color Doppler Indications:    I50.40* Unspecified combined systolic (congestive) and diastolic                 (congestive) heart failure  History:        Patient has prior history of Echocardiogram examinations. CAD,                 Prior CABG, Arrythmias:RBBB, Signs/Symptoms:Chest Pain and                 Dyspnea; Risk Factors:Hypertension and Dyslipidemia. Edema.  Sonographer:    Roseanna Rainbow RDCS Referring Phys: [id number] [place]  1. Left ventricular ejection fraction, by estimation, is 45 to 50%. The left ventricle has mildly decreased function. The left ventricle demonstrates global hypokinesis. There is mild left ventricular hypertrophy. Left ventricular diastolic parameters are consistent with Grade I diastolic dysfunction (impaired relaxation).  2. Right ventricular systolic  function is moderately reduced. The right ventricular size is mildly enlarged. There is moderately elevated pulmonary artery systolic pressure. The estimated right ventricular systolic pressure is 0000000 mmHg.  3. The mitral valve is normal in structure and function. Mild mitral valve regurgitation. No evidence of mitral stenosis.  4. The aortic valve is tricuspid. Aortic valve regurgitation is not visualized. Mild aortic valve sclerosis is present, with no evidence of aortic valve stenosis.  5. Aortic dilatation noted. There is mild dilatation of the aortic root measuring 37 mm.  6. The inferior vena cava is dilated in size with <50% respiratory variability, suggesting right atrial pressure of 15 mmHg. FINDINGS  Left Ventricle: Left ventricular ejection fraction, by estimation, is 45 to 50%. The left ventricle has mildly decreased function. The left ventricle demonstrates global hypokinesis. The left ventricular internal cavity size was normal in size. There is  mild left ventricular hypertrophy. Left ventricular diastolic parameters are consistent with Grade I diastolic dysfunction (impaired relaxation). Right Ventricle: The right ventricular size is mildly enlarged. No increase in right ventricular wall thickness. Right ventricular systolic function is moderately reduced. There is moderately elevated pulmonary artery systolic pressure. The tricuspid regurgitant velocity is 2.79 m/s, and with an assumed right atrial pressure of 15 mmHg, the estimated right ventricular systolic pressure is 0000000 mmHg. Left Atrium: Left atrial size was normal in size. Right Atrium: Right atrial size was normal in size. Pericardium: There is no evidence of pericardial effusion. Mitral Valve: The mitral valve is normal in structure and function. Mild mitral valve regurgitation. No evidence  of mitral valve stenosis. Tricuspid Valve: The tricuspid valve is normal in structure. Tricuspid valve regurgitation is trivial. Aortic Valve: The  aortic valve is tricuspid. Aortic valve regurgitation is not visualized. Mild aortic valve sclerosis is present, with no evidence of aortic valve stenosis. Pulmonic Valve: The pulmonic valve was normal in structure. Pulmonic valve regurgitation is not visualized. Aorta: Aortic dilatation noted. There is mild dilatation of the aortic root measuring 37 mm. Venous: The inferior vena cava is dilated in size with less than 50% respiratory variability, suggesting right atrial pressure of 15 mmHg. IAS/Shunts: No atrial level shunt detected by color flow Doppler.  LEFT VENTRICLE PLAX 2D LVIDd:         4.90 cm      Diastology LVIDs:         3.30 cm      LV e' lateral:   10.20 cm/s LV PW:         1.40 cm      LV E/e' lateral: 9.4 LV IVS:        1.30 cm      LV e' medial:    5.87 cm/s LVOT diam:     2.00 cm      LV E/e' medial:  16.4 LV SV:         48 LV SV Index:   21 LVOT Area:     3.14 cm                              3D Volume EF: LV Volumes (MOD)            3D EF:        43 % LV vol d, MOD A2C: 79.1 ml  LV EDV:       168 ml LV vol d, MOD A4C: 101.0 ml LV ESV:       95 ml LV vol s, MOD A2C: 54.5 ml  LV SV:        73 ml LV vol s, MOD A4C: 53.5 ml LV SV MOD A2C:     24.6 ml LV SV MOD A4C:     101.0 ml LV SV MOD BP:      35.7 ml RIGHT VENTRICLE RV S prime:     4.05 cm/s TAPSE (M-mode): 0.8 cm LEFT ATRIUM             Index       RIGHT ATRIUM           Index LA diam:        4.60 cm 2.03 cm/m  RA Area:     16.20 cm LA Vol (A2C):   36.7 ml 16.21 ml/m RA Volume:   42.20 ml  18.64 ml/m LA Vol (A4C):   42.6 ml 18.82 ml/m LA Biplane Vol: 39.6 ml 17.49 ml/m  AORTIC VALVE LVOT Vmax:   104.00 cm/s LVOT Vmean:  64.400 cm/s LVOT VTI:    0.153 m  AORTA Ao Root diam: 3.60 cm Ao Asc diam:  3.90 cm MV E velocity: 96.10 cm/s  TRICUSPID VALVE                            TR Peak grad:   31.1 mmHg                            TR Vmax:  279.00 cm/s                             SHUNTS                            Systemic VTI:  0.15 m                             Systemic Diam: 2.00 cm Loralie Champagne MD Electronically signed by Loralie Champagne MD Signature Date/Time: 05/22/2019/11:55:03 AM    Final    VAS [place] LOWER EXTREMITY VENOUS (DVT)  Result Date: 05/21/2019  Lower Venous DVTStudy Indications: Swelling.  Risk Factors: None identified. Limitations: Poor ultrasound/tissue interface. Comparison Study: No prior studies. Performing Technologist: Oliver Hum RVT  Examination Guidelines: A complete evaluation includes B-mode imaging, spectral Doppler, color Doppler, and power Doppler as needed of all accessible portions of each vessel. Bilateral testing is considered an integral part of a complete examination. Limited examinations for reoccurring indications may be performed as noted. The reflux portion of the exam is performed with the patient in reverse Trendelenburg.  +---------+---------------+---------+-----------+----------+--------------+ RIGHT    CompressibilityPhasicitySpontaneityPropertiesThrombus Aging +---------+---------------+---------+-----------+----------+--------------+ CFV      Full           Yes      Yes                                 +---------+---------------+---------+-----------+----------+--------------+ SFJ      Full                                                        +---------+---------------+---------+-----------+----------+--------------+ FV Prox  Full                                                        +---------+---------------+---------+-----------+----------+--------------+ FV Mid   Full                                                        +---------+---------------+---------+-----------+----------+--------------+ FV DistalFull                                                        +---------+---------------+---------+-----------+----------+--------------+ PFV      Full                                                         +---------+---------------+---------+-----------+----------+--------------+ POP      Full           Yes  Yes                                 +---------+---------------+---------+-----------+----------+--------------+ PTV      Full                                                        +---------+---------------+---------+-----------+----------+--------------+ PERO     Full                                                        +---------+---------------+---------+-----------+----------+--------------+   +----+---------------+---------+-----------+----------+--------------+ LEFTCompressibilityPhasicitySpontaneityPropertiesThrombus Aging +----+---------------+---------+-----------+----------+--------------+ CFV Full           Yes      Yes                                 +----+---------------+---------+-----------+----------+--------------+     Summary: RIGHT: - There is no evidence of deep vein thrombosis in the lower extremity.  - No cystic structure found in the popliteal fossa.  LEFT: - No evidence of common femoral vein obstruction.  *See table(s) above for measurements and observations. Electronically signed by Servando Snare MD on 05/21/2019 at 4:08:18 PM.    Final         Scheduled Meds: . aspirin  81 mg Oral Daily  . clopidogrel  75 mg Oral Q breakfast  . fenofibrate  160 mg Oral Daily  . ferrous sulfate  325 mg Oral TID WC  . icosapent Ethyl  2 g Oral BID  . insulin pump   Subcutaneous TID WC, HS, 0200  . metoprolol succinate  50 mg Oral BID  . silver sulfADIAZINE   Topical BID  . simvastatin  10 mg Oral Daily   Continuous Infusions: . piperacillin-tazobactam (ZOSYN)  IV 3.375 g (05/23/19 0936)  . vancomycin       LOS: 2 days   Time spent= 40 mins    Marybeth Dandy Arsenio Loader, MD [facility]  If 7PM-7AM, please contact night-coverage  05/23/2019, 11:44 AM

## 2019-05-23 NOTE — Progress Notes (Signed)
Progress Note  Patient Name: Juan [name] Date of Encounter: 05/23/2019  Primary Cardiologist: Quay Burow, MD   Subjective   No acute events overnight. He feels that the leg is improving. We discussed his echo together as well as his blood work. We discussed his statin history--he has been on simvastatin for years. He remembers being on crestor before and believes he did ok. He also remembers being told he shouldn't take atorvastatin due to his liver. He is amenable to a change to rosuvastatin today.  I discussed that any symptoms of chest pain need to be communicated, and that he may have atypical symptoms given his diabetes. He reports no symptoms at all this admission or prior.  He has had some diarrhea, being evaluated by primary team.  Inpatient Medications    Scheduled Meds: . aspirin  81 mg Oral Daily  . clopidogrel  75 mg Oral Q breakfast  . fenofibrate  160 mg Oral Daily  . ferrous sulfate  325 mg Oral TID WC  . icosapent Ethyl  2 g Oral BID  . insulin pump   Subcutaneous TID WC, HS, 0200  . metoprolol succinate  50 mg Oral BID  . silver sulfADIAZINE   Topical BID  . simvastatin  10 mg Oral Daily   Continuous Infusions: . piperacillin-tazobactam (ZOSYN)  IV 3.375 g (05/23/19 0936)  . vancomycin     PRN Meds: acetaminophen **OR** acetaminophen, nitroGLYCERIN, ondansetron **OR** ondansetron (ZOFRAN) IV   Vital Signs    Vitals:   05/22/19 2323 05/23/19 0315 05/23/19 0751 05/23/19 1124  BP: 138/86 127/77 125/78 117/74  Pulse: (!) 105 (!) 103 98 85  Resp: 18 18 18 18   Temp: 99.3 F (37.4 C) 99 F (37.2 C) 99.5 F (37.5 C) 99.8 F (37.7 C)  TempSrc: Oral Oral Oral Oral  SpO2: 96% 98% 94% 99%  Weight:      Height:        Intake/Output Summary (Last 24 hours) at 05/23/2019 1145 Last data filed at 05/23/2019 1046 Gross per 24 hour  Intake 2026.62 ml  Output 2275 ml  Net -248.38 ml   Last 3 Weights 05/22/2019 05/21/2019 05/21/2019  Weight (lbs) 242 lb 1 oz 242  lb 1 oz 232 lb  Weight (kg) 109.8 kg 109.8 kg 105.235 kg      Telemetry    NSR/sinus tach - Personally Reviewed  ECG    Sinus tach at 111 bpm, RBBB, LAFB - Personally Reviewed  Physical Exam   GEN: No acute distress.   Neck: No JVD Cardiac: RRR, no murmurs, rubs, or gallops.  Respiratory: Clear to auscultation bilaterally with very fine crackles at bases GI: Soft, nontender, non-distended  MS: No deformity. Ext: RLE is erythematous and edematous to his knee. Foot wound covered and not undressed this AM. Neuro:  Nonfocal  Psych: Normal affect   Labs    High Sensitivity Troponin:   Recent Labs  Lab 05/21/19 1458 05/21/19 1600 05/22/19 0720 05/22/19 1026  TROPONINIHS 4,494* 4,814* 8,114* 8,668*      Chemistry Recent Labs  Lab 05/21/19 0923 05/21/19 1458 05/22/19 0502 05/22/19 0502 05/22/19 1503 05/22/19 1614 05/23/19 0500  NA 132*  --  131*  --  131*  --  132*  K 4.8  --  6.0*   < > 4.4 4.6 4.2  CL 100  --  105  --  102  --  104  CO2 21*  --  20*  --  18*  --  18*  GLUCOSE 145*  --  133*  --  100*  --  85  BUN 28*  --  34*  --  35*  --  30*  CREATININE 2.31*   < > 1.87*  --  1.62*  --  1.49*  CALCIUM 9.9  --  9.1  --  9.7  --  10.0  PROT 6.6  --   --   --   --   --  6.8  ALBUMIN 3.3*  --   --   --   --   --  2.6*  AST 46*  --   --   --   --   --  56*  ALT 43  --   --   --   --   --  47*  ALKPHOS 50  --   --   --   --   --  63  BILITOT 1.2  --   --   --   --   --  0.9  GFRNONAA 31*   < > 40*  --  48*  --  53*  GFRAA 36*   < > 47*  --  56*  --  >60  ANIONGAP 11  --  6  --  11  --  10   < > = values in this interval not displayed.     Hematology Recent Labs  Lab 05/21/19 1600 05/22/19 0502 05/23/19 0500  WBC 13.2* 9.6 11.8*  RBC 4.60 4.50 4.44  HGB 11.9* 11.6* 11.2*  HCT 36.6* 35.7* 34.3*  MCV 79.6* 79.3* 77.3*  MCH 25.9* 25.8* 25.2*  MCHC 32.5 32.5 32.7  RDW 16.9* 17.1* 17.2*  PLT 87* 69* 91*    BNPNo results for input(s): BNP, PROBNP in  the last 168 hours.   DDimer No results for input(s): DDIMER in the last 168 hours.   Radiology   No New  Cardiac Studies  Echo 05/22/19 1. Left ventricular ejection fraction, by estimation, is 45 to 50%. The left ventricle has mildly decreased function. The left ventricle demonstrates global hypokinesis. There is mild left ventricular hypertrophy. Left ventricular diastolic parameters are consistent with Grade I diastolic dysfunction (impaired relaxation).  2. Right ventricular systolic function is moderately reduced. The right ventricular size is mildly enlarged. There is moderately elevated pulmonary artery systolic pressure. The estimated right ventricular systolic pressure is 0000000 mmHg.  3. The mitral valve is normal in structure and function. Mild mitral valve regurgitation. No evidence of mitral stenosis.  4. The aortic valve is tricuspid. Aortic valve regurgitation is not visualized. Mild aortic valve sclerosis is present, with no evidence of aortic valve stenosis.  5. Aortic dilatation noted. There is mild dilatation of the aortic root measuring 37 mm.  6. The inferior vena cava is dilated in size with <50% respiratory variability, suggesting right atrial pressure of 15 mmHg.   Patient Profile     54 y.o. male with a hx of hypertriglyceridemia (multiple issues with pancreatitis in the past, most recent triglycerides about 5000), CAD status post CABG, diabetes, hypertension who is being seen today for the evaluation of elevated troponin at the request of Early Osmond, MD.  Assessment & Plan    Elevated troponin: seen in consult initially by Dr. Audie Box. Felt to be demand ischemia in the setting of sepsis, as he had hypotension, acute kidney injury and lactic acidosis on presentation. He also has not had any cardiac symptoms. Also complicated by thrombocytopenia.  -recommended for medical  management -echo done yesterday with EF down from prior. Global hypokinesis reported. -his  troponins have continued to rise, initially 4994 and most recently 8668. Given his known severe CAD (see below) this may still be demand, however with dynamic ECG changes, rising troponin, and a change in EF from prior, NSTEMI cannot be excluded -he is on aspirin 81 mg and clopidogrel 75 mg -he is on metoprolol succinate 50 mg BID -he has been on simvastatin in the past. Given the severity of his CAD, will change to high intensity rosuvastatin 20 mg, which he has tolerated in the past (last seen in 2014). He remembers being told not to be on atorvastatin because of his liver. His LDL is low (see below) due to his lipid disease -platelets remain low at 91, but up from nadir of 69. They were 123 on admission. Need to be cautious with heparin given the thrombocytopenia. Does not report any active bleeding. If his platelets recover and are consistently >100, would start low dose heparin without bolus for at least 48 hours -his Cr is improving to 1.49 today, though this is still above his baseline. I suspect he may not have any intervenable disease given his prior cath in 2018, but if creatinine continues to improve can discuss cath in the future. Need to weigh risk of contrast nephropathy especially if there is low likelihood of intervenable disease  Mild volume overload: fine crackles at lung bases. Agree with one time lasix.  CAD s/p CABG. Cath from 2018 reviewed. He has severe native disease -aspirin, clopidogrel, beta blocker, and statin as above  Hypertriglyceridemia with history of pancreatitis: -continue vascepa 2 gm BID -continue fenofibrate 160 mg daily -changing statin as above. Though his LDL is low, we can attempt a higher intensity statin given his troponin elevation and history of severe CAD. -he has followed with Dr. Debara Pickett in the lipid clinic, and Dr. Debara Pickett is monitoring for clinical trials for which he may be a candidate  Type II diabetes, with insulin pump: complicates both his lipids  and his CAD  RLE cellulitis, with sepsis on presentation. MRI pending to exclude osteomyelitis  For questions or updates, please contact [place] Please consult [email or web address] for contact info under        Signed, Buford Dresser, MD  05/23/2019, 11:45 AM

## 2019-05-24 ENCOUNTER — Inpatient Hospital Stay (HOSPITAL_COMMUNITY): Payer: Commercial Managed Care - PPO

## 2019-05-24 DIAGNOSIS — N179 Acute kidney failure, unspecified: Secondary | ICD-10-CM

## 2019-05-24 DIAGNOSIS — R6520 Severe sepsis without septic shock: Secondary | ICD-10-CM

## 2019-05-24 DIAGNOSIS — L03115 Cellulitis of right lower limb: Secondary | ICD-10-CM

## 2019-05-24 DIAGNOSIS — I10 Essential (primary) hypertension: Secondary | ICD-10-CM

## 2019-05-24 LAB — COMPREHENSIVE METABOLIC PANEL
ALT: 49 U/L — ABNORMAL HIGH (ref 0–44)
AST: 50 U/L — ABNORMAL HIGH (ref 15–41)
Albumin: 2.3 g/dL — ABNORMAL LOW (ref 3.5–5.0)
Alkaline Phosphatase: 89 U/L (ref 38–126)
Anion gap: 8 (ref 5–15)
BUN: 35 mg/dL — ABNORMAL HIGH (ref 6–20)
CO2: 22 mmol/L (ref 22–32)
Calcium: 10 mg/dL (ref 8.9–10.3)
Chloride: 105 mmol/L (ref 98–111)
Creatinine, Ser: 1.53 mg/dL — ABNORMAL HIGH (ref 0.61–1.24)
GFR calc Af Amer: 60 mL/min — ABNORMAL LOW (ref >60)
GFR calc non Af Amer: 52 mL/min — ABNORMAL LOW (ref >60)
Glucose, Bld: 70 mg/dL (ref 70–99)
Potassium: 3.5 mmol/L (ref 3.5–5.1)
Sodium: 135 mmol/L (ref 135–145)
Total Bilirubin: 1 mg/dL (ref 0.3–1.2)
Total Protein: 6.1 g/dL — ABNORMAL LOW (ref 6.5–8.1)

## 2019-05-24 LAB — GLUCOSE, CAPILLARY
Glucose-Capillary: 84 mg/dL (ref 70–99)
Glucose-Capillary: 78 mg/dL (ref 70–99)
Glucose-Capillary: 193 mg/dL — ABNORMAL HIGH (ref 70–99)
Glucose-Capillary: 59 mg/dL — ABNORMAL LOW (ref 70–99)
Glucose-Capillary: 74 mg/dL (ref 70–99)
Glucose-Capillary: 134 mg/dL — ABNORMAL HIGH (ref 70–99)
Glucose-Capillary: 93 mg/dL (ref 70–99)

## 2019-05-24 LAB — CBC
HCT: 33.9 % — ABNORMAL LOW (ref 39.0–52.0)
Hemoglobin: 11 g/dL — ABNORMAL LOW (ref 13.0–17.0)
MCH: 25.3 pg — ABNORMAL LOW (ref 26.0–34.0)
MCHC: 32.4 g/dL (ref 30.0–36.0)
MCV: 78.1 fL — ABNORMAL LOW (ref 80.0–100.0)
Platelets: 99 10*3/uL — ABNORMAL LOW (ref 150–400)
RBC: 4.34 MIL/uL (ref 4.22–5.81)
RDW: 17.2 % — ABNORMAL HIGH (ref 11.5–15.5)
WBC: 10.1 10*3/uL (ref 4.0–10.5)
nRBC: 0 % (ref 0.0–0.2)

## 2019-05-24 LAB — FOLATE RBC
Folate, Hemolysate: 306 ng/mL
Folate, RBC: 775 ng/mL (ref >498)
Hematocrit: 39.5 % (ref 37.5–51.0)

## 2019-05-24 LAB — MAGNESIUM: Magnesium: 2.1 mg/dL (ref 1.7–2.4)

## 2019-05-24 NOTE — Progress Notes (Signed)
Patient requested blood sugar checked ,  He has mild short of breathe and palpitations.  Cbg 59.  Patient was given ginger ale and grape juice.  Rechecked 20 mins later CBG 74.  Patient said he felt normal.

## 2019-05-24 NOTE — Progress Notes (Signed)
Progress Note  Patient Name: Juan [name] Date of Encounter: 05/24/2019  Primary Cardiologist: Quay Burow, MD   Subjective   Seen with wife in room today. He denies any chest pain. Breathing improved. He is frustrated with recommendations for SCDs as he has been walking, but reaffirmed that if he is walking he does not have to use--if will be in bed for prolonged time then I would recommend.  Wife concerned that he will need another cath. We discussed this extensively, see below.  Inpatient Medications    Scheduled Meds: . aspirin  81 mg Oral Daily  . clopidogrel  75 mg Oral Q breakfast  . fenofibrate  160 mg Oral Daily  . ferrous sulfate  325 mg Oral TID WC  . icosapent Ethyl  2 g Oral BID  . insulin pump   Subcutaneous TID WC, HS, 0200  . metoprolol succinate  50 mg Oral BID  . rosuvastatin  20 mg Oral q1800  . silver sulfADIAZINE   Topical BID   Continuous Infusions: . piperacillin-tazobactam (ZOSYN)  IV 3.375 g (05/24/19 1022)  . vancomycin 1,500 mg (05/23/19 1421)   PRN Meds: acetaminophen **OR** acetaminophen, nitroGLYCERIN, ondansetron **OR** ondansetron (ZOFRAN) IV   Vital Signs    Vitals:   05/23/19 2034 05/23/19 2359 05/24/19 0410 05/24/19 0807  BP: 130/80 136/81 131/81 129/76  Pulse: 94 91 85 83  Resp: 19 20 19 18   Temp: 98.7 F (37.1 C) (!) 100.4 F (38 C) 99.4 F (37.4 C) 99.1 F (37.3 C)  TempSrc: Oral Oral Oral Oral  SpO2: 94% 95% 95% 96%  Weight:      Height:        Intake/Output Summary (Last 24 hours) at 05/24/2019 1200 Last data filed at 05/24/2019 0235 Gross per 24 hour  Intake 220 ml  Output 1800 ml  Net -1580 ml   Last 3 Weights 05/22/2019 05/21/2019 05/21/2019  Weight (lbs) 242 lb 1 oz 242 lb 1 oz 232 lb  Weight (kg) 109.8 kg 109.8 kg 105.235 kg      Telemetry    NSR/sinus tach - Personally Reviewed  ECG    3/5 Sinus tach at 111 bpm, RBBB, LAFB - Personally Reviewed  Physical Exam   GEN: Well nourished, well developed in no  acute distress HEENT: Normal, moist mucous membranes NECK: No JVD visible CARDIAC: regular rhythm, normal S1 and S2, no rubs or gallops. No murmur. VASCULAR: Radial pulses 2+ bilaterall RESPIRATORY:  Clear to auscultation, fine rales at bases ABDOMEN: Soft, non-tender, non-distended MUSCULOSKELETAL:  Ambulates independently SKIN: Foot wound being dressed today. Heel ulcer with erythema and edema to knee, mildly improved from yesterday.  NEUROLOGIC:  Alert and oriented x 3. No focal neuro deficits noted. PSYCHIATRIC:  Normal affect   Labs    High Sensitivity Troponin:   Recent Labs  Lab 05/21/19 1458 05/21/19 1600 05/22/19 0720 05/22/19 1026  TROPONINIHS 4,494* 4,814* 8,114* 8,668*      Chemistry Recent Labs  Lab 05/21/19 0923 05/21/19 1458 05/22/19 1503 05/22/19 1503 05/22/19 1614 05/23/19 0500 05/24/19 0218  NA 132*   < > 131*  --   --  132* 135  K 4.8   < > 4.4   < > 4.6 4.2 3.5  CL 100   < > 102  --   --  104 105  CO2 21*   < > 18*  --   --  18* 22  GLUCOSE 145*   < > 100*  --   --  85 70  BUN 28*   < > 35*  --   --  30* 35*  CREATININE 2.31*   < > 1.62*  --   --  1.49* 1.53*  CALCIUM 9.9   < > 9.7  --   --  10.0 10.0  PROT 6.6  --   --   --   --  6.8 6.1*  ALBUMIN 3.3*  --   --   --   --  2.6* 2.3*  AST 46*  --   --   --   --  56* 50*  ALT 43  --   --   --   --  47* 49*  ALKPHOS 50  --   --   --   --  63 89  BILITOT 1.2  --   --   --   --  0.9 1.0  GFRNONAA 31*   < > 48*  --   --  53* 52*  GFRAA 36*   < > 56*  --   --  >60 60*  ANIONGAP 11   < > 11  --   --  10 8   < > = values in this interval not displayed.     Hematology Recent Labs  Lab 05/22/19 0502 05/23/19 0500 05/24/19 0218  WBC 9.6 11.8* 10.1  RBC 4.50 4.44 4.34  HGB 11.6* 11.2* 11.0*  HCT 35.7* 34.3* 33.9*  MCV 79.3* 77.3* 78.1*  MCH 25.8* 25.2* 25.3*  MCHC 32.5 32.7 32.4  RDW 17.1* 17.2* 17.2*  PLT 69* 91* 99*    BNPNo results for input(s): BNP, PROBNP in the last 168 hours.    DDimer No results for input(s): DDIMER in the last 168 hours.   Radiology   MR foot 3/7 Diffuse and marked subcutaneous soft tissue swelling/edema/fluid suggesting severe cellulitis. Mild myositis involving the medial and lateral gastroc muscles. No significant findings for myofasciitis or pyomyositis. No evidence of septic arthritis at the knee or ankle joints. No findings for osteomyelitis involving the visualized bony structures. Incidental remote bone infarcts are noted in the distal femurs and proximal tibias bilaterally.  Cardiac Studies  Echo 05/22/19 1. Left ventricular ejection fraction, by estimation, is 45 to 50%. The left ventricle has mildly decreased function. The left ventricle demonstrates global hypokinesis. There is mild left ventricular hypertrophy. Left ventricular diastolic parameters are consistent with Grade I diastolic dysfunction (impaired relaxation).  2. Right ventricular systolic function is moderately reduced. The right ventricular size is mildly enlarged. There is moderately elevated pulmonary artery systolic pressure. The estimated right ventricular systolic pressure is 0000000 mmHg.  3. The mitral valve is normal in structure and function. Mild mitral valve regurgitation. No evidence of mitral stenosis.  4. The aortic valve is tricuspid. Aortic valve regurgitation is not visualized. Mild aortic valve sclerosis is present, with no evidence of aortic valve stenosis.  5. Aortic dilatation noted. There is mild dilatation of the aortic root measuring 37 mm.  6. The inferior vena cava is dilated in size with <50% respiratory variability, suggesting right atrial pressure of 15 mmHg.   Patient Profile     54 y.o. male with a hx of hypertriglyceridemia (multiple issues with pancreatitis in the past, most recent triglycerides about 5000), CAD status post CABG, diabetes, hypertension who is being seen today for the evaluation of elevated troponin at the request of  Early Osmond, MD.  Assessment & Plan    Elevated troponin: seen in consult initially by Dr.  O'Neal. Felt to be demand ischemia in the setting of sepsis, as he had hypotension, acute kidney injury and lactic acidosis on presentation. He also has not had any cardiac symptoms. Also complicated by thrombocytopenia.  -recommended for medical management -echo done 3/5 with EF down from prior. Global hypokinesis reported. -his troponins rose this admission, initially 4994 and most recently 8668. Given his known severe CAD (see below) this may still be demand, however with dynamic ECG changes, rising troponin, and a change in EF from prior, NSTEMI cannot be excluded -he is on aspirin 81 mg and clopidogrel 75 mg -he is on metoprolol succinate 50 mg BID -he has been on simvastatin in the past. Given the severity of his CAD, l changed to high intensity rosuvastatin 20 mg, which he has tolerated in the past (last seen in 2014). He remembers being told not to be on atorvastatin because of his liver. His LDL is low (see below) due to his lipid disease -platelets remain low at 99, but up from nadir of 69. They were 123 on admission. Need to be cautious with heparin given the thrombocytopenia. Does not report any active bleeding.  -his Cr is 1.53 today, down from 2.31 on 3/4 though this is still above his baseline. I suspect he may not have any intervenable disease given his prior cath in 2018, but if creatinine continues to improve can discuss cath in the future (would suspect as an outpatient). Need to weigh risk of contrast nephropathy especially if there is low likelihood of intervenable disease  Mild volume overload: fine crackles at lung bases. Lasix PRN while admitted, was on standing dose at home.  CAD s/p CABG. Cath from 2018 reviewed. He has severe native disease -aspirin, clopidogrel, beta blocker, and statin as above  Hypertriglyceridemia with history of pancreatitis: -continue vascepa 2 gm  BID -continue fenofibrate 160 mg daily -changed statin as above. Though his LDL is low, we can attempt a higher intensity statin given his troponin elevation and history of severe CAD. -he has followed with Dr. Debara Pickett in the lipid clinic, and Dr. Debara Pickett is monitoring for clinical trials for which he may be a candidate  Type II diabetes, with insulin pump: complicates both his lipids and his CAD  RLE cellulitis, with sepsis on presentation. MRI without evidence of osteomyelitis  For questions or updates, please contact [place] Please consult [email or web address] for contact info under        Signed, Buford Dresser, MD  05/24/2019, 12:00 PM

## 2019-05-24 NOTE — Progress Notes (Signed)
PROGRESS NOTE    Juan [name]  [id number] DOB: April 08, 1965 DOA: 05/21/2019 PCP: [name], NP (Inactive)   Brief Narrative:  54 year old with history of HTN, DM 2 on insulin pump, CAD status post CABG, microcytic anemia, thrombocytopenia came to the ER with complains of right lower extremity swelling and erythema.  Has history of multiple episodes of cellulitis.   Assessment & Plan:   Principal Problem:   Sepsis ([place]) Active Problems:   Acute kidney injury ([place])   Microcytosis   Thrombocytopenia ([facility])   Hyperlipidemia   CAD (coronary artery disease)   HTN (hypertension)   Cellulitis of right leg   Hyponatremia   Lactic acidosis   Elevated troponin  Sepsis secondary to right lower extremity cellulitis with myositis, moderate to severe without pruritus -Sepsis physiology is slowly improving.  Leukocytosis trending downwards, area of erythema improved. -Continue vancomycin and Zosyn.  Hopefully we can de-escalate over next 24-48 hours -RLE Dopplers-negative -MRI right lower extremity shows myositis but no pyomyositis.  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  Lower extremity, right bone infarct -Follow-up outpatient orthopedic.  Acute mild respiratory distress with orthopnea -Improved with Lasix.  Currently on room air.  Elevated troponin CAD status post CABG Congestive heart failure with preserved ejection fraction, 65% -Patient is chest pain-free.  Spoke with cardiology team this morning, suspect demand ischemia.  No need for heparin drip at this time especially with worsening platelet count. -Echocardiogram-EF 45-50%, global hypokinesis, grade 1 diastolic dysfunction. -On aspirin, Plavix, fenofibrate, Zocor  Nonspecific diarrhea -C. difficile - neg.   Hyperkalemia, 6.0 -Resolved.  Sodium levels now 4.2.  Acute kidney injury, improved -Baseline creatinine 1.0.  Admission creatinine 2.3, peaked at 2.5 -Stop IV fluids due to signs of volume overload.  Creatinine down to  1.49.  Essential hypertension -Stable, will slowly resume  Microcytic anemia, iron deficiency Thrombocytopenia -Iron supplements.  Bowel regimen held due to diarrhea   DVT prophylaxis: Not on Lovenox due to thrombocytopenia Code Status: Full code Family Communication: None Disposition Plan:   Patient From= home  Patient Anticipated D/C place= to be determined  Barriers= continue IV antibiotics at this time as his infection continues to improve.  Unsafe to transition to oral antibiotics yet.  Hopefully we can do so over next 24-48 hours and then will discharge him.  In the meantime we will also wait for cardiology clearance from their standpoint.    Subjective: Right lower extremity pain is slightly better as he is able to bear a bit more weight over last 24 hours.  Swelling has also improved but not resolved.  Review of Systems Otherwise negative except as per HPI, including: General = no fevers, chills, dizziness,  fatigue HEENT/EYES = negative for loss of vision, double vision, blurred vision,  sore throa Cardiovascular= negative for chest pain, palpitation Respiratory/lungs= negative for shortness of breath, cough, wheezing; hemoptysis,  Gastrointestinal= negative for nausea, vomiting, abdominal pain Genitourinary= negative for Dysuria MSK = Negative for arthralgia, myalgias Neurology= Negative for headache, numbness, tingling  Psychiatry= Negative for suicidal and homocidal ideation Skin= Negative for Rash   Examination: Constitutional: Not in acute distress Respiratory: Clear to auscultation bilaterally Cardiovascular: Normal sinus rhythm, no rubs Abdomen: Nontender nondistended good bowel sounds Musculoskeletal: No edema noted Skin: Right lower extremity erythema noted with swelling and tenderness but erythema has clearly regressed from the line of demarcation from the time of the admission. Neurologic: CN 2-12 grossly intact.  And nonfocal Psychiatric: Normal  judgment and insight. Alert and oriented x  3. Normal mood.       Objective: Vitals:   05/23/19 2034 05/23/19 2359 05/24/19 0410 05/24/19 0807  BP: 130/80 136/81 131/81 129/76  Pulse: 94 91 85 83  Resp: 19 20 19 18   Temp: 98.7 F (37.1 C) (!) 100.4 F (38 C) 99.4 F (37.4 C) 99.1 F (37.3 C)  TempSrc: Oral Oral Oral Oral  SpO2: 94% 95% 95% 96%  Weight:      Height:        Intake/Output Summary (Last 24 hours) at 05/24/2019 1110 Last data filed at 05/24/2019 0235 Gross per 24 hour  Intake 220 ml  Output 1800 ml  Net -1580 ml   Filed Weights   05/21/19 0918 05/21/19 2200 05/22/19 0427  Weight: 105.2 kg 109.8 kg 109.8 kg     Data Reviewed:   CBC: Recent Labs  Lab 05/21/19 0923 05/21/19 1600 05/22/19 0502 05/23/19 0500 05/24/19 0218  WBC 15.9* 13.2* 9.6 11.8* 10.1  NEUTROABS 13.8*  --   --   --   --   HGB 13.5 11.9* 11.6* 11.2* 11.0*  HCT 40.8 36.6* 35.7* 34.3* 33.9*  MCV 78.6* 79.6* 79.3* 77.3* 78.1*  PLT 123* 87* 69* 91* 99*   Basic Metabolic Panel: Recent Labs  Lab 05/21/19 0923 05/21/19 0923 05/21/19 1458 05/22/19 0502 05/22/19 1503 05/22/19 1614 05/23/19 0500 05/24/19 0218  NA 132*  --   --  131* 131*  --  132* 135  K 4.8   < >  --  6.0* 4.4 4.6 4.2 3.5  CL 100  --   --  105 102  --  104 105  CO2 21*  --   --  20* 18*  --  18* 22  GLUCOSE 145*  --   --  133* 100*  --  85 70  BUN 28*  --   --  34* 35*  --  30* 35*  CREATININE 2.31*   < > 2.54* 1.87* 1.62*  --  1.49* 1.53*  CALCIUM 9.9  --   --  9.1 9.7  --  10.0 10.0  MG  --   --  1.7  --   --   --  1.9 2.1   < > = values in this interval not displayed.   GFR: Estimated Creatinine Clearance: 70.1 mL/min (A) (by C-G formula based on SCr of 1.53 mg/dL (H)). Liver Function Tests: Recent Labs  Lab 05/21/19 0923 05/23/19 0500 05/24/19 0218  AST 46* 56* 50*  ALT 43 47* 49*  ALKPHOS 50 63 89  BILITOT 1.2 0.9 1.0  PROT 6.6 6.8 6.1*  ALBUMIN 3.3* 2.6* 2.3*   No results for input(s): LIPASE,  AMYLASE in the last 168 hours. No results for input(s): AMMONIA in the last 168 hours. Coagulation Profile: Recent Labs  Lab 05/21/19 0923  INR 1.0   Cardiac Enzymes: No results for input(s): CKTOTAL, CKMB, CKMBINDEX, TROPONINI in the last 168 hours. BNP (last 3 results) No results for input(s): PROBNP in the last 8760 hours. HbA1C: Recent Labs    05/21/19 1600  HGBA1C 8.5*   CBG: Recent Labs  Lab 05/23/19 1553 05/23/19 2114 05/24/19 0234 05/24/19 0619 05/24/19 1105  GLUCAP 76 95 84 78 193*   Lipid Profile: No results for input(s): CHOL, HDL, LDLCALC, TRIG, CHOLHDL, LDLDIRECT in the last 72 hours. Thyroid Function Tests: No results for input(s): TSH, T4TOTAL, FREET4, T3FREE, THYROIDAB in the last 72 hours. Anemia Panel: Recent Labs    05/21/19 2151 05/22/19 1026  VITAMINB12  --  524  FERRITIN 189  --   TIBC 286  --   IRON 24*  --    Sepsis Labs: Recent Labs  Lab 05/21/19 0923 05/21/19 1133  LATICACIDVEN 4.1* 3.7*    Recent Results (from the past 240 hour(s))  Culture, blood (Routine x 2)     Status: None (Preliminary result)   Collection Time: 05/21/19  9:20 AM   Specimen: BLOOD  Result Value Ref Range Status   Specimen Description BLOOD LEFT ANTECUBITAL  Final   Special Requests   Final    BOTTLES DRAWN AEROBIC AND ANAEROBIC Blood Culture adequate volume   Culture   Final    NO GROWTH 3 DAYS Performed at [facility], [place] [address], [place], [place] [address]    Report Status PENDING  Incomplete  Culture, blood (Routine x 2)     Status: None (Preliminary result)   Collection Time: 05/21/19 11:37 AM   Specimen: BLOOD  Result Value Ref Range Status   Specimen Description BLOOD RIGHT ANTECUBITAL  Final   Special Requests   Final    BOTTLES DRAWN AEROBIC AND ANAEROBIC Blood Culture adequate volume   Culture   Final    NO GROWTH 3 DAYS Performed at [facility], [place] [address] [place], [place] [address]    Report Status PENDING   Incomplete  Respiratory Panel by RT PCR (Flu A&B, Covid) - Nasopharyngeal Swab     Status: None   Collection Time: 05/21/19  2:35 PM   Specimen: Nasopharyngeal Swab  Result Value Ref Range Status   SARS Coronavirus 2 by RT PCR NEGATIVE NEGATIVE Final    Comment: (NOTE) SARS-CoV-2 target nucleic acids are NOT DETECTED. The SARS-CoV-2 RNA is generally detectable in upper respiratoy specimens during the acute phase of infection. The lowest concentration of SARS-CoV-2 viral copies this assay can detect is 131 copies/mL. A negative result does not preclude SARS-Cov-2 infection and should not be used as the sole basis for treatment or other patient management decisions. A negative result may occur with  improper specimen collection/handling, submission of specimen other than nasopharyngeal swab, presence of viral mutation(s) within the areas targeted by this assay, and inadequate number of viral copies (<131 copies/mL). A negative result must be combined with clinical observations, patient history, and epidemiological information. The expected result is Negative. Fact Sheet for Patients:  PinkCheek.be Fact Sheet for Healthcare Providers:  GravelBags.it This test is not yet ap proved or cleared by the [place] FDA and  has been authorized for detection and/or diagnosis of SARS-CoV-2 by FDA under an Emergency Use Authorization (EUA). This EUA will remain  in effect (meaning this test can be used) for the duration of the COVID-19 declaration under Section 564(b)(1) of the Act, 21 U.S.C. section 360bbb-3(b)(1), unless the authorization is terminated or revoked sooner.    Influenza A by PCR NEGATIVE NEGATIVE Final   Influenza B by PCR NEGATIVE NEGATIVE Final    Comment: (NOTE) The Xpert Xpress SARS-CoV-2/FLU/RSV assay is intended as an aid in  the diagnosis of influenza from Nasopharyngeal swab specimens and  should not be used  as a sole basis for treatment. Nasal washings and  aspirates are unacceptable for Xpert Xpress SARS-CoV-2/FLU/RSV  testing. Fact Sheet for Patients: PinkCheek.be Fact Sheet for Healthcare Providers: GravelBags.it This test is not yet approved or cleared by the [place] FDA and  has been authorized for detection and/or diagnosis of SARS-CoV-2 by  FDA under an  Emergency Use Authorization (EUA). This EUA will remain  in effect (meaning this test can be used) for the duration of the  Covid-19 declaration under Section 564(b)(1) of the Act, 21  U.S.C. section 360bbb-3(b)(1), unless the authorization is  terminated or revoked. Performed at [facility], [place] [address] [place], [place] [address]   Urine culture     Status: None   Collection Time: 05/21/19  7:20 PM   Specimen: In/Out Cath Urine  Result Value Ref Range Status   Specimen Description IN/OUT CATH URINE  Final   Special Requests NONE  Final   Culture   Final    NO GROWTH Performed at [facility], [place] [address], [place], [place] [address]    Report Status 05/22/2019 FINAL  Final  C difficile quick scan w PCR reflex     Status: None   Collection Time: 05/23/19 11:48 AM   Specimen: STOOL  Result Value Ref Range Status   C Diff antigen NEGATIVE NEGATIVE Final   C Diff toxin NEGATIVE NEGATIVE Final   C Diff interpretation No C. difficile detected.  Final    Comment: Performed at [facility], [place] [address] [place],  [address]         Radiology Studies: MR TIBIA FIBULA RIGHT WO CONTRAST  Result Date: 05/24/2019 CLINICAL DATA:  Sepsis. Lower extremity pain and swelling. EXAM: MRI OF LOWER RIGHT EXTREMITY WITHOUT CONTRAST TECHNIQUE: Multiplanar, multisequence MR imaging of the right lower extremity was performed. No intravenous contrast was administered. COMPARISON:  None. FINDINGS: Diffuse and marked subcutaneous soft tissue  swelling/edema/fluid suggesting severe cellulitis. Mild myositis involving the medial and lateral gastroc muscles. No significant findings for myofasciitis or pyomyositis. No evidence of septic arthritis at the knee or ankle joints. No findings for osteomyelitis involving the visualized bony structures. Incidental remote bone infarcts are noted in the distal femurs and proximal tibias bilaterally. IMPRESSION: 1. Diffuse and fairly marked bilateral cellulitis, right greater than left. There is also bilateral myositis but no findings for pyomyositis. 2. No MR findings suspicious for septic arthritis or osteomyelitis. 3. Incidental bilateral bone infarcts. Electronically Signed   By: Marijo Sanes M.D.   On: 05/24/2019 09:20        Scheduled Meds: . aspirin  81 mg Oral Daily  . clopidogrel  75 mg Oral Q breakfast  . fenofibrate  160 mg Oral Daily  . ferrous sulfate  325 mg Oral TID WC  . icosapent Ethyl  2 g Oral BID  . insulin pump   Subcutaneous TID WC, HS, 0200  . metoprolol succinate  50 mg Oral BID  . rosuvastatin  20 mg Oral q1800  . silver sulfADIAZINE   Topical BID   Continuous Infusions: . piperacillin-tazobactam (ZOSYN)  IV 3.375 g (05/24/19 1022)  . vancomycin 1,500 mg (05/23/19 1421)     LOS: 3 days   Time spent= 40 mins    Cloud Graham Arsenio Loader, MD [facility]  If 7PM-7AM, please contact night-coverage  05/24/2019, 11:10 AM

## 2019-05-25 LAB — COMPREHENSIVE METABOLIC PANEL
ALT: 68 U/L — ABNORMAL HIGH (ref 0–44)
AST: 62 U/L — ABNORMAL HIGH (ref 15–41)
Albumin: 2.2 g/dL — ABNORMAL LOW (ref 3.5–5.0)
Alkaline Phosphatase: 131 U/L — ABNORMAL HIGH (ref 38–126)
Anion gap: 9 (ref 5–15)
BUN: 26 mg/dL — ABNORMAL HIGH (ref 6–20)
CO2: 23 mmol/L (ref 22–32)
Calcium: 10 mg/dL (ref 8.9–10.3)
Chloride: 106 mmol/L (ref 98–111)
Creatinine, Ser: 1.15 mg/dL (ref 0.61–1.24)
GFR calc Af Amer: >60 mL/min (ref >60)
GFR calc non Af Amer: >60 mL/min (ref >60)
Glucose, Bld: 107 mg/dL — ABNORMAL HIGH (ref 70–99)
Potassium: 3.9 mmol/L (ref 3.5–5.1)
Sodium: 138 mmol/L (ref 135–145)
Total Bilirubin: 0.7 mg/dL (ref 0.3–1.2)
Total Protein: 6.3 g/dL — ABNORMAL LOW (ref 6.5–8.1)

## 2019-05-25 LAB — CBC
HCT: 35.3 % — ABNORMAL LOW (ref 39.0–52.0)
Hemoglobin: 11 g/dL — ABNORMAL LOW (ref 13.0–17.0)
MCH: 24.9 pg — ABNORMAL LOW (ref 26.0–34.0)
MCHC: 31.2 g/dL (ref 30.0–36.0)
MCV: 80 fL (ref 80.0–100.0)
Platelets: 90 10*3/uL — ABNORMAL LOW (ref 150–400)
RBC: 4.41 MIL/uL (ref 4.22–5.81)
RDW: 17.2 % — ABNORMAL HIGH (ref 11.5–15.5)
WBC: 7.6 10*3/uL (ref 4.0–10.5)
nRBC: 0 % (ref 0.0–0.2)

## 2019-05-25 LAB — GLUCOSE, CAPILLARY
Glucose-Capillary: 46 mg/dL — ABNORMAL LOW (ref 70–99)
Glucose-Capillary: 65 mg/dL — ABNORMAL LOW (ref 70–99)
Glucose-Capillary: 86 mg/dL (ref 70–99)
Glucose-Capillary: 172 mg/dL — ABNORMAL HIGH (ref 70–99)
Glucose-Capillary: 69 mg/dL — ABNORMAL LOW (ref 70–99)
Glucose-Capillary: 84 mg/dL (ref 70–99)

## 2019-05-25 LAB — TROPONIN I (HIGH SENSITIVITY): Troponin I (High Sensitivity): 1274 ng/L (ref <18)

## 2019-05-25 LAB — MAGNESIUM: Magnesium: 2.1 mg/dL (ref 1.7–2.4)

## 2019-05-25 MED ORDER — METOPROLOL SUCCINATE ER 50 MG PO TB24
75.0000 mg | ORAL_TABLET | Freq: Two times a day (BID) | ORAL | Status: DC
  Administered 2019-05-25: 75 mg via ORAL
  Filled 2019-05-25: qty 1

## 2019-05-25 MED ORDER — ROSUVASTATIN CALCIUM 20 MG PO TABS: 20.0000 mg | ORAL_TABLET | Freq: Every day | ORAL | 0 refills | Status: DC

## 2019-05-25 MED ORDER — AMOXICILLIN-POT CLAVULANATE 875-125 MG PO TABS: 1.0000 | ORAL_TABLET | Freq: Two times a day (BID) | ORAL | 0 refills | Status: AC

## 2019-05-25 MED ORDER — METOPROLOL SUCCINATE ER 25 MG PO TB24: 75.0000 mg | ORAL_TABLET | Freq: Two times a day (BID) | ORAL | 0 refills | Status: DC

## 2019-05-25 MED ORDER — FERROUS SULFATE 325 (65 FE) MG PO TABS: 325.0000 mg | ORAL_TABLET | Freq: Two times a day (BID) | ORAL | 1 refills | Status: DC

## 2019-05-25 MED ORDER — SENNOSIDES-DOCUSATE SODIUM 8.6-50 MG PO TABS: 2.0000 | ORAL_TABLET | Freq: Every day | ORAL | 1 refills | Status: DC | PRN

## 2019-05-25 NOTE — TOC Transition Note (Signed)
Transition of Care [facility]) - CM/SW Discharge Note   Patient Details  Name: Juan [name] MRN: [id number] Date of Birth: 09-04-1965  [facility]) CM/SW Contact:  Pollie Friar, RN Phone Number: 05/25/2019, 2:01 PM   Clinical Narrative:    Pt discharging home with self care. Pt and wife say they can do his dressing change at home.  Wife to provide transportation home.   Final next level of care: Home/Self Care Barriers to Discharge: No Barriers Identified   Patient Goals and CMS Choice        Discharge Placement                       Discharge Plan and Services   Discharge Planning Services: CM Consult                                 Social Determinants of Health (SDOH) Interventions     Readmission Risk Interventions No flowsheet data found.

## 2019-05-25 NOTE — Discharge Summary (Signed)
Physician Discharge Summary  Juan [name] [id number] DOB: January 10, 1966 DOA: 05/21/2019  PCP: [name], NP (Inactive)  Admit date: 05/21/2019 Discharge date: 05/25/2019  Admitted From: Home Disposition: Home  Recommendations for Outpatient Follow-up:  1. Follow up with PCP in 1-2 weeks 2. Please obtain BMP/CBC in one week your next doctors visit.  3. Augmentin orally for 7 days 4. Statin changed to Crestor 20 mg daily 5. Toprol increased by cardiology team to 75 mg twice daily 6. Recommend outpatient referral to orthopedic by his primary care physician for evaluation of bone infarcts.   Discharge Condition: Stable CODE STATUS: Full Diet recommendation: Cardiac  Brief/Interim Summary: 54 year old with history of HTN, DM 2 on insulin pump, CAD status post CABG, microcytic anemia, thrombocytopenia came to the ER with complains of right lower extremity swelling and erythema.  Has history of multiple episodes of cellulitis.  Initially his troponins were significantly elevated around 8000 without any EKG changes.  Cardiology team was consulted who recommended medical management due to demand ischemia and his advanced cardiac disease.  He was also treated with IV vancomycin and Zosyn for lower extremity cellulitis.  MRI was positive for some myositis but no evidence of pyomyositis was seen. Echocardiogram showed EF of 45-50%, global hypokinesia with grade 1 diastolic dysfunction.  On 05/25/2019 patient was deemed stable to be discharged as his cardiac condition remained stable and had significant improvement in his right lower extremity cellulitis.  Sepsis secondary to right lower extremity cellulitis with myositis, moderate to severe without pruritus -Sepsis physiology resolved.  Transition vancomycin/Zosyn to 7 more days of oral Augmentin. -RLE Dopplers-negative -MRI right lower extremity shows myositis but no pyomyositis.  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  Lower extremity, right bone  infarct -Follow-up outpatient orthopedic.  Acute mild respiratory distress with orthopnea -Resolved  Elevated troponin CAD status post CABG Congestive heart failure with preserved ejection fraction, 65% -Chest pain-free, likely demand ischemia.  Seen by cardiology. -Echocardiogram-EF 45-50%, global hypokinesis, grade 1 diastolic dysfunction. -On aspirin, Plavix, fenofibrate -Toprol increased to twice daily.  Nonspecific diarrhea -C. difficile - neg.   Hyperkalemia, 6.0 -Resolved.    Acute kidney injury, improved -Baseline creatinine 1.0.  Admission creatinine 2.3, peaked at 2.5 -Now at baseline 1.1  Essential hypertension -Stable, will slowly resume  Microcytic anemia, iron deficiency Thrombocytopenia -Iron supplements.  Bowel regimen     Discharge Diagnoses:  Principal Problem:   Sepsis ([place]) Active Problems:   Acute kidney injury ([place])   Microcytosis   Thrombocytopenia ([facility])   Hyperlipidemia   CAD (coronary artery disease)   HTN (hypertension)   Cellulitis of right leg   Hyponatremia   Lactic acidosis   Elevated troponin    Consultations:  Cardiology  Subjective: Feels great, able to bear weight on his right lower extremity and ambulate.  Remains chest pain-free and does not have any complaints.  General = no fevers, chills, dizziness,  fatigue HEENT/EYES = negative for loss of vision, double vision, blurred vision,  sore throa Cardiovascular= negative for chest pain, palpitation Respiratory/lungs= negative for shortness of breath, cough, wheezing; hemoptysis,  Gastrointestinal= negative for nausea, vomiting, abdominal pain Genitourinary= negative for Dysuria MSK = Negative for arthralgia, myalgias Neurology= Negative for headache, numbness, tingling  Psychiatry= Negative for suicidal and homocidal ideation Skin= Negative for Rash    Discharge Exam: Vitals:   05/25/19 0800 05/25/19 0857  BP:  129/73  Pulse:  85  Resp: 20 18  Temp:   97.9 F (36.6 C)  SpO2:  98%   Vitals:   05/24/19 2351 05/25/19 0345 05/25/19 0800 05/25/19 0857  BP: 129/77 129/81  129/73  Pulse: 82 85  85  Resp: 19 19 20 18   Temp: 99.4 F (37.4 C) 97.8 F (36.6 C)  97.9 F (36.6 C)  TempSrc: Oral Oral  Oral  SpO2: 97% 100%  98%  Weight:      Height:        General: Pt is alert, awake, not in acute distress Cardiovascular: RRR, S1/S2 +, no rubs, no gallops Respiratory: CTA bilaterally, no wheezing, no rhonchi Abdominal: Soft, NT, ND, bowel sounds + Extremities: no edema, no cyanosis  Discharge Instructions   Allergies as of 05/25/2019      Reactions   Reglan [metoclopramide] Other (See Comments)   Only can tolerate in low doses, in higher doses it has the opposite effect      Medication List    STOP taking these medications   simvastatin 10 MG tablet Commonly known as: ZOCOR     TAKE these medications   amoxicillin-clavulanate 875-125 MG tablet Commonly known as: Augmentin Take 1 tablet by mouth 2 (two) times daily for 7 days.   aspirin 81 MG chewable tablet Chew 1 tablet (81 mg total) by mouth daily.   clopidogrel 75 MG tablet Commonly known as: PLAVIX TAKE 1 TABLET BY MOUTH DAILY WITH BREAKFAST. What changed: when to take this   Farxiga 5 MG Tabs tablet Generic drug: dapagliflozin propanediol Take 5 mg by mouth daily.   fenofibrate 160 MG tablet Take 160 mg by mouth daily.   ferrous sulfate 325 (65 FE) MG tablet Take 1 tablet (325 mg total) by mouth 2 (two) times daily with a meal.   furosemide 40 MG tablet Commonly known as: LASIX TAKE 1 TABLET BY MOUTH EVERY DAY   HUMULIN R 500 UNIT/ML injection Generic drug: insulin regular human CONCENTRATED Inject into the skin continuous. Using insulin pump   insulin pump Soln Inject into the skin.   isosorbide mononitrate 30 MG 24 hr tablet Commonly known as: IMDUR Take 0.5 tablets (15 mg total) by mouth daily. What changed:   when to take this  reasons to  take this   metFORMIN 1000 MG tablet Commonly known as: GLUCOPHAGE Take 1,000 mg by mouth 2 (two) times daily with a meal.   metoprolol succinate 25 MG 24 hr tablet Commonly known as: TOPROL-XL Take 3 tablets (75 mg total) by mouth 2 (two) times daily. Take with or immediately following a meal. What changed:   medication strength  See the new instructions.   nitroGLYCERIN 0.4 MG SL tablet Commonly known as: Nitrostat Place 1 tablet (0.4 mg total) under the tongue every 5 (five) minutes as needed.   ramipril 5 MG capsule Commonly known as: ALTACE Take 5 mg by mouth daily.   rosuvastatin 20 MG tablet Commonly known as: CRESTOR Take 1 tablet (20 mg total) by mouth daily at 6 PM.   senna-docusate 8.6-50 MG tablet Commonly known as: Senokot-S Take 2 tablets by mouth daily as needed for mild constipation.   Systane Balance 0.6 % Soln Generic drug: Propylene Glycol Place 1 drop into both eyes daily as needed (dry eyes).   Vascepa 1 g capsule Generic drug: icosapent Ethyl TAKE 2 CAPSULES TWICE A DAY What changed: how much to take      Follow-up Information    [name], NP. Schedule an appointment as soon as possible for a visit in 1 week(s).   Specialty:  Nurse Practitioner Contact information: [address] [address] [phone]        Lorretta Harp, MD .   Specialties: Cardiology, Radiology Contact information: [address] [place] [address] [phone]          Allergies  Allergen Reactions  . Reglan [Metoclopramide] Other (See Comments)    Only can tolerate in low doses, in higher doses it has the opposite effect    You were cared for by a hospitalist during your hospital stay. If you have any questions about your discharge medications or the care you received while you were in the hospital after you are discharged, you can call the unit and asked to speak with the hospitalist on call if the  hospitalist that took care of you is not available. Once you are discharged, your primary care physician will handle any further medical issues. Please note that no refills for any discharge medications will be authorized once you are discharged, as it is imperative that you return to your primary care physician (or establish a relationship with a primary care physician if you do not have one) for your aftercare needs so that they can reassess your need for medications and monitor your lab values.   Procedures/Studies: MR TIBIA FIBULA RIGHT WO CONTRAST  Result Date: 05/24/2019 CLINICAL DATA:  Sepsis. Lower extremity pain and swelling. EXAM: MRI OF LOWER RIGHT EXTREMITY WITHOUT CONTRAST TECHNIQUE: Multiplanar, multisequence MR imaging of the right lower extremity was performed. No intravenous contrast was administered. COMPARISON:  None. FINDINGS: Diffuse and marked subcutaneous soft tissue swelling/edema/fluid suggesting severe cellulitis. Mild myositis involving the medial and lateral gastroc muscles. No significant findings for myofasciitis or pyomyositis. No evidence of septic arthritis at the knee or ankle joints. No findings for osteomyelitis involving the visualized bony structures. Incidental remote bone infarcts are noted in the distal femurs and proximal tibias bilaterally. IMPRESSION: 1. Diffuse and fairly marked bilateral cellulitis, right greater than left. There is also bilateral myositis but no findings for pyomyositis. 2. No MR findings suspicious for septic arthritis or osteomyelitis. 3. Incidental bilateral bone infarcts. Electronically Signed   By: Marijo Sanes M.D.   On: 05/24/2019 09:20   DG Chest Port 1 View  Result Date: 05/21/2019 CLINICAL DATA:  Fever EXAM: PORTABLE CHEST 1 VIEW COMPARISON:  12/14/2016 FINDINGS: Postop CABG. Negative for heart failure. Lungs remain clear without infiltrate or effusion. No mass or adenopathy. IMPRESSION: No active disease. Electronically Signed   By:  Franchot Gallo M.D.   On: 05/21/2019 11:26   ECHOCARDIOGRAM COMPLETE  Result Date: 05/22/2019    ECHOCARDIOGRAM REPORT   Patient Name:   Juan [name] Date of Exam: 05/22/2019 Medical Rec #:  [id number]     Height:       70.0 in Accession #:    [id number]    Weight:       242.1 lb Date of Birth:  [birth year]     BSA:          2.264 m Patient Age:    4 years      BP:           125/66 mmHg Patient Gender: M             HR:           106 bpm. Exam Location:  Inpatient Procedure: 2D Echo, 3D Echo, Cardiac Doppler and Color Doppler Indications:    I50.40* Unspecified combined systolic (congestive) and  diastolic                 (congestive) heart failure  History:        Patient has prior history of Echocardiogram examinations. CAD,                 Prior CABG, Arrythmias:RBBB, Signs/Symptoms:Chest Pain and                 Dyspnea; Risk Factors:Hypertension and Dyslipidemia. Edema.  Sonographer:    Roseanna Rainbow RDCS Referring Phys: [id number] [place]  1. Left ventricular ejection fraction, by estimation, is 45 to 50%. The left ventricle has mildly decreased function. The left ventricle demonstrates global hypokinesis. There is mild left ventricular hypertrophy. Left ventricular diastolic parameters are consistent with Grade I diastolic dysfunction (impaired relaxation).  2. Right ventricular systolic function is moderately reduced. The right ventricular size is mildly enlarged. There is moderately elevated pulmonary artery systolic pressure. The estimated right ventricular systolic pressure is 0000000 mmHg.  3. The mitral valve is normal in structure and function. Mild mitral valve regurgitation. No evidence of mitral stenosis.  4. The aortic valve is tricuspid. Aortic valve regurgitation is not visualized. Mild aortic valve sclerosis is present, with no evidence of aortic valve stenosis.  5. Aortic dilatation noted. There is mild dilatation of the aortic root measuring 37 mm.  6. The inferior vena cava is  dilated in size with <50% respiratory variability, suggesting right atrial pressure of 15 mmHg. FINDINGS  Left Ventricle: Left ventricular ejection fraction, by estimation, is 45 to 50%. The left ventricle has mildly decreased function. The left ventricle demonstrates global hypokinesis. The left ventricular internal cavity size was normal in size. There is  mild left ventricular hypertrophy. Left ventricular diastolic parameters are consistent with Grade I diastolic dysfunction (impaired relaxation). Right Ventricle: The right ventricular size is mildly enlarged. No increase in right ventricular wall thickness. Right ventricular systolic function is moderately reduced. There is moderately elevated pulmonary artery systolic pressure. The tricuspid regurgitant velocity is 2.79 m/s, and with an assumed right atrial pressure of 15 mmHg, the estimated right ventricular systolic pressure is 0000000 mmHg. Left Atrium: Left atrial size was normal in size. Right Atrium: Right atrial size was normal in size. Pericardium: There is no evidence of pericardial effusion. Mitral Valve: The mitral valve is normal in structure and function. Mild mitral valve regurgitation. No evidence of mitral valve stenosis. Tricuspid Valve: The tricuspid valve is normal in structure. Tricuspid valve regurgitation is trivial. Aortic Valve: The aortic valve is tricuspid. Aortic valve regurgitation is not visualized. Mild aortic valve sclerosis is present, with no evidence of aortic valve stenosis. Pulmonic Valve: The pulmonic valve was normal in structure. Pulmonic valve regurgitation is not visualized. Aorta: Aortic dilatation noted. There is mild dilatation of the aortic root measuring 37 mm. Venous: The inferior vena cava is dilated in size with less than 50% respiratory variability, suggesting right atrial pressure of 15 mmHg. IAS/Shunts: No atrial level shunt detected by color flow Doppler.  LEFT VENTRICLE PLAX 2D LVIDd:         4.90 cm       Diastology LVIDs:         3.30 cm      LV e' lateral:   10.20 cm/s LV PW:         1.40 cm      LV E/e' lateral: 9.4 LV IVS:        1.30 cm  LV e' medial:    5.87 cm/s LVOT diam:     2.00 cm      LV E/e' medial:  16.4 LV SV:         48 LV SV Index:   21 LVOT Area:     3.14 cm                              3D Volume EF: LV Volumes (MOD)            3D EF:        43 % LV vol d, MOD A2C: 79.1 ml  LV EDV:       168 ml LV vol d, MOD A4C: 101.0 ml LV ESV:       95 ml LV vol s, MOD A2C: 54.5 ml  LV SV:        73 ml LV vol s, MOD A4C: 53.5 ml LV SV MOD A2C:     24.6 ml LV SV MOD A4C:     101.0 ml LV SV MOD BP:      35.7 ml RIGHT VENTRICLE RV S prime:     4.05 cm/s TAPSE (M-mode): 0.8 cm LEFT ATRIUM             Index       RIGHT ATRIUM           Index LA diam:        4.60 cm 2.03 cm/m  RA Area:     16.20 cm LA Vol (A2C):   36.7 ml 16.21 ml/m RA Volume:   42.20 ml  18.64 ml/m LA Vol (A4C):   42.6 ml 18.82 ml/m LA Biplane Vol: 39.6 ml 17.49 ml/m  AORTIC VALVE LVOT Vmax:   104.00 cm/s LVOT Vmean:  64.400 cm/s LVOT VTI:    0.153 m  AORTA Ao Root diam: 3.60 cm Ao Asc diam:  3.90 cm MV E velocity: 96.10 cm/s  TRICUSPID VALVE                            TR Peak grad:   31.1 mmHg                            TR Vmax:        279.00 cm/s                             SHUNTS                            Systemic VTI:  0.15 m                            Systemic Diam: 2.00 cm Loralie Champagne MD Electronically signed by Loralie Champagne MD Signature Date/Time: 05/22/2019/11:55:03 AM    Final    VAS [place] LOWER EXTREMITY VENOUS (DVT)  Result Date: 05/21/2019  Lower Venous DVTStudy Indications: Swelling.  Risk Factors: None identified. Limitations: Poor ultrasound/tissue interface. Comparison Study: No prior studies. Performing Technologist: Oliver Hum RVT  Examination Guidelines: A complete evaluation includes B-mode imaging, spectral Doppler, color Doppler, and power Doppler as needed of all accessible portions of each vessel. Bilateral  testing is considered an integral part of  a complete examination. Limited examinations for reoccurring indications may be performed as noted. The reflux portion of the exam is performed with the patient in reverse Trendelenburg.  +---------+---------------+---------+-----------+----------+--------------+ RIGHT    CompressibilityPhasicitySpontaneityPropertiesThrombus Aging +---------+---------------+---------+-----------+----------+--------------+ CFV      Full           Yes      Yes                                 +---------+---------------+---------+-----------+----------+--------------+ SFJ      Full                                                        +---------+---------------+---------+-----------+----------+--------------+ FV Prox  Full                                                        +---------+---------------+---------+-----------+----------+--------------+ FV Mid   Full                                                        +---------+---------------+---------+-----------+----------+--------------+ FV DistalFull                                                        +---------+---------------+---------+-----------+----------+--------------+ PFV      Full                                                        +---------+---------------+---------+-----------+----------+--------------+ POP      Full           Yes      Yes                                 +---------+---------------+---------+-----------+----------+--------------+ PTV      Full                                                        +---------+---------------+---------+-----------+----------+--------------+ PERO     Full                                                        +---------+---------------+---------+-----------+----------+--------------+   +----+---------------+---------+-----------+----------+--------------+  LEFTCompressibilityPhasicitySpontaneityPropertiesThrombus Aging +----+---------------+---------+-----------+----------+--------------+ CFV Full           Yes  Yes                                 +----+---------------+---------+-----------+----------+--------------+     Summary: RIGHT: - There is no evidence of deep vein thrombosis in the lower extremity.  - No cystic structure found in the popliteal fossa.  LEFT: - No evidence of common femoral vein obstruction.  *See table(s) above for measurements and observations. Electronically signed by Servando Snare MD on 05/21/2019 at 4:08:18 PM.    Final      The results of significant diagnostics from this hospitalization (including imaging, microbiology, ancillary and laboratory) are listed below for reference.     Microbiology: Recent Results (from the past 240 hour(s))  Culture, blood (Routine x 2)     Status: None (Preliminary result)   Collection Time: 05/21/19  9:20 AM   Specimen: BLOOD  Result Value Ref Range Status   Specimen Description BLOOD LEFT ANTECUBITAL  Final   Special Requests   Final    BOTTLES DRAWN AEROBIC AND ANAEROBIC Blood Culture adequate volume   Culture   Final    NO GROWTH 4 DAYS Performed at [facility], [address] [place], [place] [address]    Report Status PENDING  Incomplete  Culture, blood (Routine x 2)     Status: None (Preliminary result)   Collection Time: 05/21/19 11:37 AM   Specimen: BLOOD  Result Value Ref Range Status   Specimen Description BLOOD RIGHT ANTECUBITAL  Final   Special Requests   Final    BOTTLES DRAWN AEROBIC AND ANAEROBIC Blood Culture adequate volume   Culture   Final    NO GROWTH 4 DAYS Performed at [facility], [employer] [address], [place], [place] [address]    Report Status PENDING  Incomplete  Respiratory Panel by RT PCR (Flu A&B, Covid) - Nasopharyngeal Swab     Status: None   Collection Time: 05/21/19  2:35 PM   Specimen: Nasopharyngeal Swab  Result  Value Ref Range Status   SARS Coronavirus 2 by RT PCR NEGATIVE NEGATIVE Final    Comment: (NOTE) SARS-CoV-2 target nucleic acids are NOT DETECTED. The SARS-CoV-2 RNA is generally detectable in upper respiratoy specimens during the acute phase of infection. The lowest concentration of SARS-CoV-2 viral copies this assay can detect is 131 copies/mL. A negative result does not preclude SARS-Cov-2 infection and should not be used as the sole basis for treatment or other patient management decisions. A negative result may occur with  improper specimen collection/handling, submission of specimen other than nasopharyngeal swab, presence of viral mutation(s) within the areas targeted by this assay, and inadequate number of viral copies (<131 copies/mL). A negative result must be combined with clinical observations, patient history, and epidemiological information. The expected result is Negative. Fact Sheet for Patients:  PinkCheek.be Fact Sheet for Healthcare Providers:  GravelBags.it This test is not yet ap proved or cleared by the [place] FDA and  has been authorized for detection and/or diagnosis of SARS-CoV-2 by FDA under an Emergency Use Authorization (EUA). This EUA will remain  in effect (meaning this test can be used) for the duration of the COVID-19 declaration under Section 564(b)(1) of the Act, 21 U.S.C. section 360bbb-3(b)(1), unless the authorization is terminated or revoked sooner.    Influenza A by PCR NEGATIVE NEGATIVE Final   Influenza B by PCR NEGATIVE NEGATIVE Final    Comment: (NOTE) The Xpert Xpress  SARS-CoV-2/FLU/RSV assay is intended as an aid in  the diagnosis of influenza from Nasopharyngeal swab specimens and  should not be used as a sole basis for treatment. Nasal washings and  aspirates are unacceptable for Xpert Xpress SARS-CoV-2/FLU/RSV  testing. Fact Sheet for  Patients: PinkCheek.be Fact Sheet for Healthcare Providers: GravelBags.it This test is not yet approved or cleared by the [place] FDA and  has been authorized for detection and/or diagnosis of SARS-CoV-2 by  FDA under an Emergency Use Authorization (EUA). This EUA will remain  in effect (meaning this test can be used) for the duration of the  Covid-19 declaration under Section 564(b)(1) of the Act, 21  U.S.C. section 360bbb-3(b)(1), unless the authorization is  terminated or revoked. Performed at [facility], [place] [address] [place], [place] [address]   Urine culture     Status: None   Collection Time: 05/21/19  7:20 PM   Specimen: In/Out Cath Urine  Result Value Ref Range Status   Specimen Description IN/OUT CATH URINE  Final   Special Requests NONE  Final   Culture   Final    NO GROWTH Performed at [facility], [place] [address] [place], [place] [address]    Report Status 05/22/2019 FINAL  Final  C difficile quick scan w PCR reflex     Status: None   Collection Time: 05/23/19 11:48 AM   Specimen: STOOL  Result Value Ref Range Status   C Diff antigen NEGATIVE NEGATIVE Final   C Diff toxin NEGATIVE NEGATIVE Final   C Diff interpretation No C. difficile detected.  Final    Comment: Performed at [facility], [place] [address] [place], [place] [address]     Labs: BNP (last 3 results) No results for input(s): BNP in the last 8760 hours. Basic Metabolic Panel: Recent Labs  Lab 05/21/19 0923 05/21/19 1458 05/22/19 0502 05/22/19 0502 05/22/19 1503 05/22/19 1614 05/23/19 0500 05/24/19 0218 05/25/19 0440  NA   < >  --  131*  --  131*  --  132* 135 138  K   < >  --  6.0*   < > 4.4 4.6 4.2 3.5 3.9  CL   < >  --  105  --  102  --  104 105 106  CO2   < >  --  20*  --  18*  --  18* 22 23  GLUCOSE   < >  --  133*  --  100*  --  85 70 107*  BUN   < >  --  34*  --  35*  --  30* 35* 26*  CREATININE    < > 2.54* 1.87*  --  1.62*  --  1.49* 1.53* 1.15  CALCIUM   < >  --  9.1  --  9.7  --  10.0 10.0 10.0  MG  --  1.7  --   --   --   --  1.9 2.1 2.1   < > = values in this interval not displayed.   Liver Function Tests: Recent Labs  Lab 05/21/19 0923 05/23/19 0500 05/24/19 0218 05/25/19 0440  AST 46* 56* 50* 62*  ALT 43 47* 49* 68*  ALKPHOS 50 63 89 131*  BILITOT 1.2 0.9 1.0 0.7  PROT 6.6 6.8 6.1* 6.3*  ALBUMIN 3.3* 2.6* 2.3* 2.2*   No results for input(s): LIPASE, AMYLASE in the last 168 hours. No results for input(s): AMMONIA in  the last 168 hours. CBC: Recent Labs  Lab 05/21/19 0923 05/21/19 0923 05/21/19 1600 05/21/19 1600 05/22/19 0502 05/22/19 1026 05/23/19 0500 05/24/19 0218 05/25/19 0440  WBC 15.9*   < > 13.2*  --  9.6  --  11.8* 10.1 7.6  NEUTROABS 13.8*  --   --   --   --   --   --   --   --   HGB 13.5   < > 11.9*  --  11.6*  --  11.2* 11.0* 11.0*  HCT 40.8   < > 36.6*   < > 35.7* 39.5 34.3* 33.9* 35.3*  MCV 78.6*   < > 79.6*  --  79.3*  --  77.3* 78.1* 80.0  PLT 123*   < > 87*  --  69*  --  91* 99* 90*   < > = values in this interval not displayed.   Cardiac Enzymes: No results for input(s): CKTOTAL, CKMB, CKMBINDEX, TROPONINI in the last 168 hours. BNP: Invalid input(s): POCBNP CBG: Recent Labs  Lab 05/24/19 2119 05/25/19 0258 05/25/19 0311 05/25/19 0328 05/25/19 0616  GLUCAP 93 46* 65* 86 172*   D-Dimer No results for input(s): DDIMER in the last 72 hours. Hgb A1c No results for input(s): HGBA1C in the last 72 hours. Lipid Profile No results for input(s): CHOL, HDL, LDLCALC, TRIG, CHOLHDL, LDLDIRECT in the last 72 hours. Thyroid function studies No results for input(s): TSH, T4TOTAL, T3FREE, THYROIDAB in the last 72 hours.  Invalid input(s): FREET3 Anemia work up No results for input(s): VITAMINB12, FOLATE, FERRITIN, TIBC, IRON, RETICCTPCT in the last 72 hours. Urinalysis    Component Value Date/Time   COLORURINE AMBER (A) 05/21/2019  1920   APPEARANCEUR CLOUDY (A) 05/21/2019 1920   LABSPEC 1.022 05/21/2019 1920   PHURINE 5.0 05/21/2019 1920   GLUCOSEU 50 (A) 05/21/2019 1920   HGBUR SMALL (A) 05/21/2019 1920   BILIRUBINUR NEGATIVE 05/21/2019 1920   KETONESUR NEGATIVE 05/21/2019 1920   PROTEINUR >=300 (A) 05/21/2019 1920   UROBILINOGEN 0.2 02/04/2013 1430   NITRITE NEGATIVE 05/21/2019 1920   LEUKOCYTESUR NEGATIVE 05/21/2019 1920   Sepsis Labs Invalid input(s): PROCALCITONIN,  WBC,  LACTICIDVEN Microbiology Recent Results (from the past 240 hour(s))  Culture, blood (Routine x 2)     Status: None (Preliminary result)   Collection Time: 05/21/19  9:20 AM   Specimen: BLOOD  Result Value Ref Range Status   Specimen Description BLOOD LEFT ANTECUBITAL  Final   Special Requests   Final    BOTTLES DRAWN AEROBIC AND ANAEROBIC Blood Culture adequate volume   Culture   Final    NO GROWTH 4 DAYS Performed at [facility], [address], [place], [place] [address]    Report Status PENDING  Incomplete  Culture, blood (Routine x 2)     Status: None (Preliminary result)   Collection Time: 05/21/19 11:37 AM   Specimen: BLOOD  Result Value Ref Range Status   Specimen Description BLOOD RIGHT ANTECUBITAL  Final   Special Requests   Final    BOTTLES DRAWN AEROBIC AND ANAEROBIC Blood Culture adequate volume   Culture   Final    NO GROWTH 4 DAYS Performed at [facility], [place] [address], [place], [place] [address]    Report Status PENDING  Incomplete  Respiratory Panel by RT PCR (Flu A&B, Covid) - Nasopharyngeal Swab     Status: None   Collection Time: 05/21/19  2:35 PM   Specimen: Nasopharyngeal Swab  Result Value Ref Range Status   SARS Coronavirus 2 by RT PCR NEGATIVE NEGATIVE Final    Comment: (NOTE) SARS-CoV-2 target nucleic acids are NOT DETECTED. The SARS-CoV-2 RNA is generally detectable in upper respiratoy specimens during the acute phase of infection. The lowest concentration of SARS-CoV-2 viral  copies this assay can detect is 131 copies/mL. A negative result does not preclude SARS-Cov-2 infection and should not be used as the sole basis for treatment or other patient management decisions. A negative result may occur with  improper specimen collection/handling, submission of specimen other than nasopharyngeal swab, presence of viral mutation(s) within the areas targeted by this assay, and inadequate number of viral copies (<131 copies/mL). A negative result must be combined with clinical observations, patient history, and epidemiological information. The expected result is Negative. Fact Sheet for Patients:  PinkCheek.be Fact Sheet for Healthcare Providers:  GravelBags.it This test is not yet ap proved or cleared by the [place] FDA and  has been authorized for detection and/or diagnosis of SARS-CoV-2 by FDA under an Emergency Use Authorization (EUA). This EUA will remain  in effect (meaning this test can be used) for the duration of the COVID-19 declaration under Section 564(b)(1) of the Act, 21 U.S.C. section 360bbb-3(b)(1), unless the authorization is terminated or revoked sooner.    Influenza A by PCR NEGATIVE NEGATIVE Final   Influenza B by PCR NEGATIVE NEGATIVE Final    Comment: (NOTE) The Xpert Xpress SARS-CoV-2/FLU/RSV assay is intended as an aid in  the diagnosis of influenza from Nasopharyngeal swab specimens and  should not be used as a sole basis for treatment. Nasal washings and  aspirates are unacceptable for Xpert Xpress SARS-CoV-2/FLU/RSV  testing. Fact Sheet for Patients: PinkCheek.be Fact Sheet for Healthcare Providers: GravelBags.it This test is not yet approved or cleared by the [place] FDA and  has been authorized for detection and/or diagnosis of SARS-CoV-2 by  FDA under an Emergency Use Authorization (EUA). This EUA will  remain  in effect (meaning this test can be used) for the duration of the  Covid-19 declaration under Section 564(b)(1) of the Act, 21  U.S.C. section 360bbb-3(b)(1), unless the authorization is  terminated or revoked. Performed at [facility], [place] [address], [place], [place] [address]   Urine culture     Status: None   Collection Time: 05/21/19  7:20 PM   Specimen: In/Out Cath Urine  Result Value Ref Range Status   Specimen Description IN/OUT CATH URINE  Final   Special Requests NONE  Final   Culture   Final    NO GROWTH Performed at [facility], [place] [address], [place], [place] [address]    Report Status 05/22/2019 FINAL  Final  C difficile quick scan w PCR reflex     Status: None   Collection Time: 05/23/19 11:48 AM   Specimen: STOOL  Result Value Ref Range Status   C Diff antigen NEGATIVE NEGATIVE Final   C Diff toxin NEGATIVE NEGATIVE Final   C Diff interpretation No C. difficile detected.  Final    Comment: Performed at [facility], [place] [address], [place], [place] [address]     Time coordinating discharge:  I have spent 35 minutes face to face with the patient and on the ward discussing the patients care, assessment, plan and disposition with other care givers. >50% of the time was devoted counseling the patient about the risks and benefits of treatment/Discharge disposition and coordinating care.  SIGNED:   Damita Lack, MD  [facility] 05/25/2019, 10:32 AM   If 7PM-7AM, please contact night-coverage

## 2019-05-25 NOTE — Progress Notes (Signed)
CRITICAL VALUE STICKER  CRITICAL VALUE:Results for Juan [name], Juan [name] (MRN [id number]) as of 05/25/2019 03:04  Ref. Range 05/25/2019 02:58  Glucose-Capillary Latest Ref Range: 70 - 99 mg/dL 46 (L)    RECEIVER (on-site recipient of call):T. Sharmane Dame, RN  MD NOTIFIED: No Treated per protocol   **Pt states he does not get enough food during his meals.

## 2019-05-25 NOTE — Progress Notes (Signed)
Pt. W/ discharge order. DC instructions reviewed w/pt and his spouse who verbalized understanding. Dressing supplies and instructions and return to work note given to the patient. Wheeled out via wheelchair.

## 2019-05-25 NOTE — Progress Notes (Addendum)
Progress Note  Patient Name: Juan [name] Date of Encounter: 05/25/2019  Primary Cardiologist: Quay Burow, MD   Subjective   Pt denies chest pain, SOB, orthopnea. RLE improving, Does not feel palpitations, unaware of his PVCs/bigeminy.  Inpatient Medications    Scheduled Meds:  aspirin  81 mg Oral Daily   clopidogrel  75 mg Oral Q breakfast   fenofibrate  160 mg Oral Daily   ferrous sulfate  325 mg Oral TID WC   icosapent Ethyl  2 g Oral BID   insulin pump   Subcutaneous TID WC, HS, 0200   metoprolol succinate  50 mg Oral BID   rosuvastatin  20 mg Oral q1800   silver sulfADIAZINE   Topical BID   Continuous Infusions:  piperacillin-tazobactam (ZOSYN)  IV 3.375 g (05/25/19 0409)   vancomycin Stopped (05/24/19 1730)   PRN Meds: acetaminophen **OR** acetaminophen, nitroGLYCERIN, ondansetron **OR** ondansetron (ZOFRAN) IV   Vital Signs    Vitals:   05/24/19 1628 05/24/19 1942 05/24/19 2351 05/25/19 0345  BP: (!) 153/85 (!) 142/80 129/77 129/81  Pulse: 90 90 82 85  Resp: 18 18 19 19   Temp: 98.9 F (37.2 C) 98.9 F (37.2 C) 99.4 F (37.4 C) 97.8 F (36.6 C)  TempSrc: Oral Oral Oral Oral  SpO2: 100% 98% 97% 100%  Weight:      Height:        Intake/Output Summary (Last 24 hours) at 05/25/2019 0744 Last data filed at 05/25/2019 0409 Gross per 24 hour  Intake 1000 ml  Output 1000 ml  Net 0 ml   Last 3 Weights 05/22/2019 05/21/2019 05/21/2019  Weight (lbs) 242 lb 1 oz 242 lb 1 oz 232 lb  Weight (kg) 109.8 kg 109.8 kg 105.235 kg      Telemetry    Sinus in the 80s with PVCs, periods of bigeminy - Personally Reviewed  ECG    No new tracings - Personally Reviewed  Physical Exam   GEN: No acute distress.   Neck: No JVD Cardiac: RRR, no murmurs, rubs, or gallops.  Respiratory: Clear to auscultation bilaterally. GI: Soft, nontender, non-distended  MS: trace edema Neuro:  Nonfocal  Psych: Normal affect   Labs    High Sensitivity Troponin:   Recent Labs    Lab 05/21/19 1458 05/21/19 1600 05/22/19 0720 05/22/19 1026  TROPONINIHS 4,494* 4,814* 8,114* 8,668*      Chemistry Recent Labs  Lab 05/23/19 0500 05/24/19 0218 05/25/19 0440  NA 132* 135 138  K 4.2 3.5 3.9  CL 104 105 106  CO2 18* 22 23  GLUCOSE 85 70 107*  BUN 30* 35* 26*  CREATININE 1.49* 1.53* 1.15  CALCIUM 10.0 10.0 10.0  PROT 6.8 6.1* 6.3*  ALBUMIN 2.6* 2.3* 2.2*  AST 56* 50* 62*  ALT 47* 49* 68*  ALKPHOS 63 89 131*  BILITOT 0.9 1.0 0.7  GFRNONAA 53* 52* >60  GFRAA >60 60* >60  ANIONGAP 10 8 9      Hematology Recent Labs  Lab 05/23/19 0500 05/24/19 0218 05/25/19 0440  WBC 11.8* 10.1 7.6  RBC 4.44 4.34 4.41  HGB 11.2* 11.0* 11.0*  HCT 34.3* 33.9* 35.3*  MCV 77.3* 78.1* 80.0  MCH 25.2* 25.3* 24.9*  MCHC 32.7 32.4 31.2  RDW 17.2* 17.2* 17.2*  PLT 91* 99* 90*    BNPNo results for input(s): BNP, PROBNP in the last 168 hours.   DDimer No results for input(s): DDIMER in the last 168 hours.   Radiology  MR TIBIA FIBULA RIGHT WO CONTRAST  Result Date: 05/24/2019 CLINICAL DATA:  Sepsis. Lower extremity pain and swelling. EXAM: MRI OF LOWER RIGHT EXTREMITY WITHOUT CONTRAST TECHNIQUE: Multiplanar, multisequence MR imaging of the right lower extremity was performed. No intravenous contrast was administered. COMPARISON:  None. FINDINGS: Diffuse and marked subcutaneous soft tissue swelling/edema/fluid suggesting severe cellulitis. Mild myositis involving the medial and lateral gastroc muscles. No significant findings for myofasciitis or pyomyositis. No evidence of septic arthritis at the knee or ankle joints. No findings for osteomyelitis involving the visualized bony structures. Incidental remote bone infarcts are noted in the distal femurs and proximal tibias bilaterally. IMPRESSION: 1. Diffuse and fairly marked bilateral cellulitis, right greater than left. There is also bilateral myositis but no findings for pyomyositis. 2. No MR findings suspicious for septic  arthritis or osteomyelitis. 3. Incidental bilateral bone infarcts. Electronically Signed   By: Marijo Sanes M.D.   On: 05/24/2019 09:20    Cardiac Studies   Echo 05/22/19: 1. Left ventricular ejection fraction, by estimation, is 45 to 50%. The  left ventricle has mildly decreased function. The left ventricle  demonstrates global hypokinesis. There is mild left ventricular  hypertrophy. Left ventricular diastolic parameters  are consistent with Grade I diastolic dysfunction (impaired relaxation).   2. Right ventricular systolic function is moderately reduced. The right  ventricular size is mildly enlarged. There is moderately elevated  pulmonary artery systolic pressure. The estimated right ventricular  systolic pressure is 0000000 mmHg.   3. The mitral valve is normal in structure and function. Mild mitral  valve regurgitation. No evidence of mitral stenosis.   4. The aortic valve is tricuspid. Aortic valve regurgitation is not  visualized. Mild aortic valve sclerosis is present, with no evidence of  aortic valve stenosis.   5. Aortic dilatation noted. There is mild dilatation of the aortic root  measuring 37 mm.   6. The inferior vena cava is dilated in size with <50% respiratory  variability, suggesting right atrial pressure of 15 mmHg.   Patient Profile     54 y.o. male with a hx of hypertriglyceridemia (multiple issues with pancreatitis in the past, most recent triglycerides about 5000), CAD status post CABG, diabetes, hypertension who is being seen today for the evaluation of elevated troponin   Assessment & Plan    1. Elevated troponin thought to be secondary to demand ischemia in the setting of sepsis with AKI, hypotension, and and lactic acidosis with lower extremity cellulitis 2. CAD s/p CABG x4 2014 (LIMA-LAD, radial-OM, SVG-diagonal, SVG-RCA - last heart cath in 2018 with patent LIMA-LAD, severe native disease, underwent DES x 3 to SVG-RCA - hs troponin: 4494 --> 4814 -->  8114 --> 8668 - heparin was initially held due to thrombocytopenia in the setting of no angina symptoms - PLT 90 - continue ASA and plavix, BB - pt continues to deny chest pain - will continue to plan for medical management   3. PVCs, bigeminy - pt is asymptomatic, unaware of bigeminy - increased BB to 75 mg toprol BID - unclear chronicity of his PVCs - may consider monitor outpatient to ascertain PVC burden - if high, could be the reason for EF reduction   4. Newly reduced EF to 45-50% - pt is not requiring diuresis   5. AKI - sCr now normalized to 1.15   6. Hyperlipidemia 05/29/2018: Cholesterol, Total 515; HDL 3; LDL Calculated Comment; Triglycerides 5,975 - was switched to crestor 20 mg given his known disease and demand ischemia -  continue fenofibrate and vascepa   7. Hx of pancreatitis - triglycerides significantly elevated this admission - previously 700s in 2013   8. RLE cellulitis - continue ABX - MRI negative for osteomyelitis   9. Hypertension - pressures are now normalized - home ACEI initially held for hypotension and AKI - home imdur held - will add back as needed - monitor after BB increase as above  I will arrange cardiology follow up.       For questions or updates, please contact [place] Please consult [email or web address] for contact info under        Signed, Ledora Bottcher, PA  05/25/2019, 7:44 AM    Patient examined chart reviewed. Patient anxious for d/c No angina no acute ECG changes. Reviewed last cath and likely issue with chronically and diffusely diseased RCA graft that now has full metal jacket and small vessel run off Patient radial to circumflex and LIMA to LAD. Will check troponin this am so long as coming down cant be d/c with outpatient f/u Dr Gwenlyn Found. May be worth outpatient myovue to assess ischemic burden. PLT 99-90 this am Cr improved to baseline  Jenkins Rouge MD [facility]

## 2019-05-26 LAB — CULTURE, BLOOD (ROUTINE X 2)
Culture: NO GROWTH
Culture: NO GROWTH
Special Requests: ADEQUATE
Special Requests: ADEQUATE

## 2019-05-31 NOTE — Progress Notes (Signed)
Virtual Visit via Video Note   This visit type was conducted due to national recommendations for restrictions regarding the COVID-19 Pandemic (e.g. social distancing) in an effort to limit this patient's exposure and mitigate transmission in our community.  Due to his co-morbid illnesses, this patient is at least at moderate risk for complications without adequate follow up.  This format is felt to be most appropriate for this patient at this time.  All issues noted in this document were discussed and addressed.  A limited physical exam was performed with this format.  Please refer to the patient's chart for his consent to telehealth for [facility].  Evaluation Performed:  Follow-up visit  This visit type was conducted due to national recommendations for restrictions regarding the COVID-19 Pandemic (e.g. social distancing).  This format is felt to be most appropriate for this patient at this time.  All issues noted in this document were discussed and addressed.  No physical exam was performed (except for noted visual exam findings with Video Visits).  Please refer to the patient's chart (MyChart message for video visits and phone note for telephone visits) for the patient's consent to telehealth for [facility]  Date:  06/01/2019   ID:  Juan [name], DOB [birth year], MRN [id number]  Patient Location:  [id number] [place] [address]   Provider location:     [place] [address] Office [phone] Fax [phone]   PCP:  [name], NP (Inactive)  Cardiologist:  Quay Burow, MD  Electrophysiologist:  None   Chief Complaint: Follow-up  History of Present Illness:    Juan [name] is a 54 y.o. male who presents via audio/video conferencing for a telehealth visit today.  Patient verified DOB and address.  The patient does not symptoms concerning for COVID-19 infection (fever, chills, cough, or new SHORTNESS  OF BREATH).   Juan [name] has a past medical history of hypertriglyceridemia, pancreatitis, coronary artery disease status post CABG x4 Dr. Prescott Gum (LIMA to LAD, left radial to OM, SVG to diagonal and PDA 11/14).  Echocardiogram 05/22/2019 showed an LVEF of 45-50% with global hypokinesis, LVH, G1 DD.  Moderately reduced RV function mildly enlarged RV, and moderately elevated pulmonary pressures.  Right ventricular systolic pressure 0000000 mmHg.  Mild aortic root dilation 37 mm.  He underwent cardiac catheterization before chest pain and left upper extremity pain 10/18 which showed high-grade disease in his RCA SVG which was stented with DES x3.   His lipids are now managed by Dr. Debara Pickett.  History of triglyceride levels in the 6000 range.  Currently on Vascepa.  He was recently admitted to the hospital 05/21/2019-05/25/2019.  He presented to the emergency department with complaints of right lower extremity swelling and erythema.  He has a history of multiple episodes of lower extremity cellulitis.  His troponins were initially elevated around 8000.  He had no EKG changes.  This was felt to be secondary to demand ischemia in the setting of sepsis with AKA, hypertension, and lactic acidosis with lower extremity cellulitis.  He was treated with IV Zosyn and vancomycin which were transitioned to p.o. Augmentin.  He was noted to have PVCs and bigeminy.  He was unaware of PVCs and his beta-blocker was increased to 75 mg twice daily.    He presents virtually for follow-up today and states he still has some lower extremity swelling.  Today is his last day of Augmentin.  He has  tolerated his increased metoprolol succinate dose without side effects.  His blood pressure has been in the [id number] systolic range.He does not add salt to his food but does not eat a low-sodium diet.  He is concerned that he will not be able to complete his job with his lower extremity swelling.  I will increase his furosemide to 80 mg x 3 days, give him 20  mEq of potassium x3 days order a BMP in 1 week and advise lower extremity support stockings.  I will also give him a work note so that he may be out of work for 1 more week.  Today he denies chest pain, shortness of breath,  fatigue, palpitations, melena, hematuria, hemoptysis, diaphoresis, weakness, presyncope, syncope, orthopnea, and PND.   Prior CV studies:   The following studies were reviewed today:  Echocardiogram 05/22/2019 IMPRESSIONS    1. Left ventricular ejection fraction, by estimation, is 45 to 50%. The  left ventricle has mildly decreased function. The left ventricle  demonstrates global hypokinesis. There is mild left ventricular  hypertrophy. Left ventricular diastolic parameters  are consistent with Grade I diastolic dysfunction (impaired relaxation).  2. Right ventricular systolic function is moderately reduced. The right  ventricular size is mildly enlarged. There is moderately elevated  pulmonary artery systolic pressure. The estimated right ventricular  systolic pressure is 0000000 mmHg.  3. The mitral valve is normal in structure and function. Mild mitral  valve regurgitation. No evidence of mitral stenosis.  4. The aortic valve is tricuspid. Aortic valve regurgitation is not  visualized. Mild aortic valve sclerosis is present, with no evidence of  aortic valve stenosis.  5. Aortic dilatation noted. There is mild dilatation of the aortic root  measuring 37 mm.  6. The inferior vena cava is dilated in size with <50% respiratory  variability, suggesting right atrial pressure of 15 mmHg.  Cardiac catheterization 12/17/2016  Prox RCA to Mid RCA lesion, 80 %stenosed.  Mid RCA lesion, 80 %stenosed.  Dist RCA lesion, 95 %stenosed.  Prox Cx lesion, 95 %stenosed.  Ost 1st Mrg to 1st Mrg lesion, 95 %stenosed.  Ost Cx to Prox Cx lesion, 70 %stenosed.  Mid Cx to Dist Cx lesion, 80 %stenosed.  Ost 1st Diag to 1st Diag lesion, 90 %stenosed.  Ost 2nd Diag to  2nd Diag lesion, 90 %stenosed.  Mid LAD to Dist LAD lesion, 90 %stenosed.  Dist LAD lesion, 100 %stenosed.  LIMA.  Left radial artery.  SVG.  Origin to Prox Graft lesion, 100 %stenosed.  SVG and is large and anatomically normal.  Mid Graft lesion, 80 %stenosed.  Prox Graft lesion, 80 %stenosed.  Post intervention, there is a 0% residual stenosis.  A stent was successfully placed.  Dist Graft to Insertion lesion, 95 %stenosed.  Post intervention, there is a 0% residual stenosis.  A stent was successfully placed.  The left ventricular systolic function is normal.  LV end diastolic pressure is normal.  The left ventricular ejection fraction is 50-55% by visual estimate.  Past Medical History:  Diagnosis Date  . Abnormal cardiovascular stress test 02/06/2013  . Anemia    occasional - no problems currently(10/02/2011)  . Bilateral renal cysts 06/16/2011   states no known problems  . Blood transfusion without reported diagnosis   . CAD (coronary artery disease), LAD 90%, 1st diag 95%, LCX 70% wth AV groove 90%, RCA 40-50% mid and 80% long distal stenosis 02/03/13 02/04/2013  . Chest pain    positive Myoview stress test  .  Coronary artery calcification seen on CAT scan   . Diabetes mellitus    IDDM  . Fatty liver disease, nonalcoholic [id number]  . Hyperlipidemia   . Hypertension    states no dx. of HTN, takes med. to protect kidneys due to DM  . Lateral meniscus tear 09/2011   left  . Loose body in knee 09/2011   loose bodies left knee  . Non Hodgkin's lymphoma ([place]) 1991  . Pancreatitis    occasional - last episode 06/2011  . S/P CABG x 4, 02/05/13 LIMA-LAD; LT. RADIAL-OM;VG-DIAG; VG-PDA 02/06/2013   10/18 3/4 patent grafts (occluded SVG-->Diag), PCI/DESx 3 SVG-->RCA, normal EF  . Splenomegaly, congestive, chronic   . Stuffy and runny nose 10/02/2011   yellow drainage from nose   Past Surgical History:  Procedure Laterality Date  . ABDOMINAL AORTIC ANEURYSM  REPAIR    . ANTERIOR CERVICAL DECOMP/DISCECTOMY FUSION N/A 03/26/2018   Procedure: ANTERIOR CERVICAL DECOMPRESSION FUSION CERVICAL 5-6 WITH INSTRUMENTATION AND ALLOGRAFT;  Surgeon: Phylliss Bob, MD;  Location: [place];  Service: Orthopedics;  Laterality: N/A;  . CORONARY ARTERY BYPASS GRAFT N/A 02/05/2013   Procedure: CORONARY ARTERY BYPASS GRAFTING (CABG);  Surgeon: Ivin Poot, MD;  Location: [place];  Service: Open Heart Surgery;  Laterality: N/A;  Coronary artery bypass graft on pump times four using left internal mammary artery and right greater saphenous vein via endovein harvest and left radial artery harvest.   . CORONARY STENT INTERVENTION  12/17/2016    PCI and drug-eluting stenting of the mid and distal RCA SVG   . CORONARY STENT INTERVENTION N/A 12/17/2016   Procedure: CORONARY STENT INTERVENTION;  Surgeon: Lorretta Harp, MD;  Location: [facility];  Service: Cardiovascular;  Laterality: N/A;  . ELBOW SURGERY    . HERNIA REPAIR     inguinal   . herniated disc    . INTRAOPERATIVE TRANSESOPHAGEAL ECHOCARDIOGRAM N/A 02/05/2013   Procedure: INTRAOPERATIVE TRANSESOPHAGEAL ECHOCARDIOGRAM;  Surgeon: Ivin Poot, MD;  Location: [place];  Service: Open Heart Surgery;  Laterality: N/A;  . KNEE ARTHROSCOPY  10/09/2011   Procedure: ARTHROSCOPY KNEE;  Surgeon: Nita Sells, MD;  Location: [place];  Service: Orthopedics;  Laterality: Left;  . LEFT HEART CATH AND CORS/GRAFTS ANGIOGRAPHY N/A 12/17/2016   Procedure: LEFT HEART CATH AND CORS/GRAFTS ANGIOGRAPHY;  Surgeon: Lorretta Harp, MD;  Location: [place] [facility];  Service: Cardiovascular;  Laterality: N/A;  . LEFT HEART CATHETERIZATION WITH CORONARY ANGIOGRAM N/A 02/03/2013   Procedure: LEFT HEART CATHETERIZATION WITH CORONARY ANGIOGRAM;  Surgeon: Lorretta Harp, MD;  Location: [facility] C[facility];  Service: Cardiovascular;  Laterality: N/A;  . NASAL SEPTUM SURGERY    . RADIAL ARTERY HARVEST Left 02/05/2013    Procedure: RADIAL ARTERY HARVEST;  Surgeon: Ivin Poot, MD;  Location: [place];  Service: Vascular;  Laterality: Left;  . TIBIA BONE BIOPSY  x 3   left  . TONSILLECTOMY       Current Meds  Medication Sig  . amoxicillin-clavulanate (AUGMENTIN) 875-125 MG tablet Take 1 tablet by mouth 2 (two) times daily for 7 days.  Marland Kitchen aspirin 81 MG chewable tablet Chew 1 tablet (81 mg total) by mouth daily.  . clopidogrel (PLAVIX) 75 MG tablet TAKE 1 TABLET BY MOUTH DAILY WITH BREAKFAST. (Patient taking differently: Take 75 mg by mouth daily. )  . FARXIGA 5 MG TABS tablet Take 5 mg by mouth daily.  . fenofibrate 160 MG tablet Take 160 mg by mouth  daily.   . ferrous sulfate 325 (65 FE) MG tablet Take 1 tablet (325 mg total) by mouth 2 (two) times daily with a meal.  . furosemide (LASIX) 40 MG tablet TAKE 1 TABLET BY MOUTH EVERY DAY (Patient taking differently: Take 40 mg by mouth daily. )  . Insulin Human (INSULIN PUMP) SOLN Inject into the skin.  Marland Kitchen insulin regular human CONCENTRATED (HUMULIN R) 500 UNIT/ML SOLN injection Inject into the skin continuous. Using insulin pump  . metFORMIN (GLUCOPHAGE) 1000 MG tablet Take 1,000 mg by mouth 2 (two) times daily with a meal.  . metoprolol succinate (TOPROL-XL) 25 MG 24 hr tablet Take 3 tablets (75 mg total) by mouth 2 (two) times daily. Take with or immediately following a meal.  . nitroGLYCERIN (NITROSTAT) 0.4 MG SL tablet Place 1 tablet (0.4 mg total) under the tongue every 5 (five) minutes as needed.  Marland Kitchen Propylene Glycol (SYSTANE BALANCE) 0.6 % SOLN Place 1 drop into both eyes daily as needed (dry eyes).  . ramipril (ALTACE) 5 MG capsule Take 5 mg by mouth daily.  . rosuvastatin (CRESTOR) 20 MG tablet Take 1 tablet (20 mg total) by mouth daily at 6 PM.  . VASCEPA 1 g CAPS TAKE 2 CAPSULES TWICE A DAY (Patient taking differently: Take 2 g by mouth 2 (two) times daily. )     Allergies:   Reglan [metoclopramide]   Social History   Tobacco Use  . Smoking  status: Never Smoker  . Smokeless tobacco: Never Used  Substance Use Topics  . Alcohol use: No  . Drug use: No     Family Hx: The patient's family history is not on file. He was adopted.  ROS:   Please see the history of present illness.     All other systems reviewed and are negative.   Labs/Other Tests and Data Reviewed:    Recent Labs: 05/25/2019: ALT 68; BUN 26; Creatinine, Ser 1.15; Hemoglobin 11.0; Magnesium 2.1; Platelets 90; Potassium 3.9; Sodium 138   Recent Lipid Panel Lab Results  Component Value Date/Time   CHOL 515 (H) 05/29/2018 11:23 AM   TRIG 5,975 (HH) 05/29/2018 11:23 AM   HDL 3 (L) 05/29/2018 11:23 AM   CHOLHDL 171.7 (H) 05/29/2018 11:23 AM   CHOLHDL 14.2 06/17/2011 04:00 AM   LDLCALC Comment 05/29/2018 11:23 AM   LDLDIRECT 6 05/29/2018 11:23 AM    Wt Readings from Last 3 Encounters:  06/01/19 232 lb (105.2 kg)  05/22/19 242 lb 1 oz (109.8 kg)  12/29/18 235 lb 9.6 oz (106.9 kg)     Exam:    Vital Signs:  BP 137/68   Pulse 89   Ht 5\' 10"  (1.778 m)   Wt 232 lb (105.2 kg)   BMI 33.29 kg/m    Well nourished, well developed male in no  acute distress.   ASSESSMENT & PLAN:    1.  Coronary artery disease-no chest discomfort today.  Status post CABG x4 2014 (LIMA to LAD, left radial to OM, SVG to diagonal and PDA ) cardiac catheterization 2018 with patent LIMA-LAD stent, severe native disease, underwent DES x3 to SVG-RCA. Continue aspirin 81 mg daily Continue Plavix 75 mg daily Continue nitroglycerin as needed Continue metoprolol succinate 75 mg daily Heart healthy low-sodium diet Increase physical activity as tolerated  Lower extremity edema-+1 pitting bilateral lower extremity edema. Increase furosemide to 80 mg x 3 days then resume 40 mg daily Start potassium 20 mEq x 3 days then stop Lower extremity support stockings  Elevate extremities when not active Heart healthy low-sodium diet-salty 6 given Order BMP in 1 week  New reduced  ejection fraction/HFrEF -no increased work of breathing or increased fatigue.  Echocardiogram showed reduced ejection fraction at 45-50% from 60-65% on last echocardiogram. Continue metoprolol succinate Continue continue furosemide 40 mg tablet daily Heart healthy low-sodium diet Increase physical activity as tolerated Daily weights Lower extremity support stockings Work note-out of work for 1 week.  PVCs/bigeminy-heart rate today 89.  Patient unaware Continue metoprolol succinate 75 mg daily Avoid triggers caffeine, chocolate, EtOH etc.  Hyperlipidemia-total cholesterol 515, triglycerides 5975 Continue Crestor 20 mg at bedtime Continue fenofibrate Continue Vascepa Followed by Dr. Debara Pickett  Essential hypertension-BP today 137/68. Continue metoprolol succinate 75 mg daily Continue ramipril 5 mg daily Heart healthy low-sodium diet-salty 6 given Increase physical activity as tolerated  Right lower extremity cellulitis-no increased edema or erythema today.  Received course of vancomycin and Zosyn during admission and was transitioned to Augmentin p.o. Followed by PCP  Disposition: Follow-up with me virtually in 1 week  COVID-19 Education: The signs and symptoms of COVID-19 were discussed with the patient and how to seek care for testing (follow up with PCP or arrange E-visit).  The importance of social distancing was discussed today.  Patient Risk:   After full review of this patients clinical status, I feel that they are at least moderate risk at this time.  Time:   Today, I have spent 15 minutes with the patient with telehealth technology discussing lower extremity swelling, cellulitis, medications, diet, BMP, working out..     Medication Adjustments/Labs and Tests Ordered: Current medicines are reviewed at length with the patient today.  Concerns regarding medicines are outlined above.   Tests Ordered: Orders Placed This Encounter  Procedures  . Basic metabolic panel    Medication Changes: Meds ordered this encounter  Medications  . potassium chloride SA (KLOR-CON M20) 20 MEQ tablet    Sig: Take 1 tablet (20 mEq total) by mouth daily for 3 days. THEN STOP    Dispense:  3 tablet    Refill:  0     Disposition:  in 1 week(s)  Signed, Jossie Ng. [facility] [place] [address] Office [phone] Fax [phone]

## 2019-06-01 ENCOUNTER — Encounter: Payer: Self-pay | Admitting: General Practice

## 2019-06-01 ENCOUNTER — Telehealth: Payer: Self-pay | Admitting: Cardiovascular Disease

## 2019-06-01 ENCOUNTER — Telehealth (INDEPENDENT_AMBULATORY_CARE_PROVIDER_SITE_OTHER): Payer: Commercial Managed Care - PPO | Admitting: General Practice

## 2019-06-01 VITALS — BP 137/68 | HR 89 | Ht 70.0 in | Wt 232.0 lb

## 2019-06-01 DIAGNOSIS — E785 Hyperlipidemia, unspecified: Secondary | ICD-10-CM

## 2019-06-01 DIAGNOSIS — L03115 Cellulitis of right lower limb: Secondary | ICD-10-CM

## 2019-06-01 DIAGNOSIS — I493 Ventricular premature depolarization: Secondary | ICD-10-CM

## 2019-06-01 DIAGNOSIS — I110 Hypertensive heart disease with heart failure: Secondary | ICD-10-CM | POA: Diagnosis not present

## 2019-06-01 DIAGNOSIS — I2510 Atherosclerotic heart disease of native coronary artery without angina pectoris: Secondary | ICD-10-CM

## 2019-06-01 DIAGNOSIS — I5021 Acute systolic (congestive) heart failure: Secondary | ICD-10-CM | POA: Diagnosis not present

## 2019-06-01 DIAGNOSIS — R600 Localized edema: Secondary | ICD-10-CM

## 2019-06-01 DIAGNOSIS — E783 Hyperchylomicronemia: Secondary | ICD-10-CM

## 2019-06-01 DIAGNOSIS — Z951 Presence of aortocoronary bypass graft: Secondary | ICD-10-CM

## 2019-06-01 DIAGNOSIS — I10 Essential (primary) hypertension: Secondary | ICD-10-CM

## 2019-06-01 DIAGNOSIS — I5020 Unspecified systolic (congestive) heart failure: Secondary | ICD-10-CM

## 2019-06-01 MED ORDER — POTASSIUM CHLORIDE CRYS ER 20 MEQ PO TBCR: 20.0000 meq | EXTENDED_RELEASE_TABLET | Freq: Every day | ORAL | 0 refills | Status: DC

## 2019-06-01 NOTE — Telephone Encounter (Signed)
Attempted to reach the patient. The line kept ringing. No voicemail available.

## 2019-06-01 NOTE — Patient Instructions (Addendum)
Medication Instructions:  Your physician has recommended you make the following change in your medication:   1. 2. START POTASSIUM 20 MEQ FOR 3 DAYS THEN STOP   *If you need a refill on your cardiac medications before your next appointment, please call your pharmacy*   Lab Work: TO BE DONE ON THE 18TH OR 19TH OF MARCH AT [facility]: [facility] PLEASE MAKE SURE TO FAX RESULTS TO [phone] ATTN: Coletta Memos, NP  If you have labs (blood work) drawn today and your tests are completely normal, you will receive your results only by: Marland Kitchen MyChart Message (if you have MyChart) OR . A paper copy in the mail If you have any lab test that is abnormal or we need to change your treatment, we will call you to review the results.   Testing/Procedures: NONE   Follow-Up: At [facility], you and your health needs are our priority.  As part of our continuing mission to provide you with exceptional heart care, we have created designated Provider Care Teams.  These Care Teams include your primary Cardiologist (physician) and Advanced Practice Providers (APPs -  Physician Assistants and Nurse Practitioners) who all work together to provide you with the care you need, when you need it.  We recommend signing up for the patient portal called "MyChart".  Sign up information is provided on this After Visit Summary.  MyChart is used to connect with patients for Virtual Visits (Telemedicine).  Patients are able to view lab/test results, encounter notes, upcoming appointments, etc.  Non-urgent messages can be sent to your provider as well.   To learn more about what you can do with MyChart, go to [email or web address].    Your next appointment:   1 WEEK   The format for your next appointment:   Virtual Visit   Provider:   Coletta Memos, FNP   Other Instructions 1. USE LOWER EXTREMITY SUPPORT STOCKINGS, ELEVATE YOUR LEGS WHEN NOT ACTIVE.   Low-Sodium Eating Plan Sodium, which is an element that makes up  salt, helps you maintain a healthy balance of fluids in your body. Too much sodium can increase your blood pressure and cause fluid and waste to be held in your body. Your health care provider or dietitian may recommend following this plan if you have high blood pressure (hypertension), kidney disease, liver disease, or heart failure. Eating less sodium can help lower your blood pressure, reduce swelling, and protect your heart, liver, and kidneys. What are tips for following this plan? General guidelines  Most people on this plan should limit their sodium intake to 1,500-2,000 mg (milligrams) of sodium each day. Reading food labels   The Nutrition Facts label lists the amount of sodium in one serving of the food. If you eat more than one serving, you must multiply the listed amount of sodium by the number of servings.  Choose foods with less than 140 mg of sodium per serving.  Avoid foods with 300 mg of sodium or more per serving. Shopping  Look for lower-sodium products, often labeled as "low-sodium" or "no salt added."  Always check the sodium content even if foods are labeled as "unsalted" or "no salt added".  Buy fresh foods. ? Avoid canned foods and premade or frozen meals. ? Avoid canned, cured, or processed meats  Buy breads that have less than 80 mg of sodium per slice. Cooking  Eat more home-cooked food and less restaurant, buffet, and fast food.  Avoid adding salt when cooking. Use salt-free seasonings or herbs  instead of table salt or sea salt. Check with your health care provider or pharmacist before using salt substitutes.  Cook with plant-based oils, such as canola, sunflower, or olive oil. Meal planning  When eating at a restaurant, ask that your food be prepared with less salt or no salt, if possible.  Avoid foods that contain MSG (monosodium glutamate). MSG is sometimes added to [place] food, bouillon, and some canned foods. What foods are recommended? The  items listed may not be a complete list. Talk with your dietitian about what dietary choices are best for you. Grains Low-sodium cereals, including oats, puffed wheat and rice, and shredded wheat. Low-sodium crackers. Unsalted rice. Unsalted pasta. Low-sodium bread. Whole-grain breads and whole-grain pasta. Vegetables Fresh or frozen vegetables. "No salt added" canned vegetables. "No salt added" tomato sauce and paste. Low-sodium or reduced-sodium tomato and vegetable juice. Fruits Fresh, frozen, or canned fruit. Fruit juice. Meats and other protein foods Fresh or frozen (no salt added) meat, poultry, seafood, and fish. Low-sodium canned tuna and salmon. Unsalted nuts. Dried peas, beans, and lentils without added salt. Unsalted canned beans. Eggs. Unsalted nut butters. Dairy Milk. Soy milk. Cheese that is naturally low in sodium, such as ricotta cheese, fresh mozzarella, or Swiss cheese Low-sodium or reduced-sodium cheese. Cream cheese. Yogurt. Fats and oils Unsalted butter. Unsalted margarine with no trans fat. Vegetable oils such as canola or olive oils. Seasonings and other foods Fresh and dried herbs and spices. Salt-free seasonings. Low-sodium mustard and ketchup. Sodium-free salad dressing. Sodium-free light mayonnaise. Fresh or refrigerated horseradish. Lemon juice. Vinegar. Homemade, reduced-sodium, or low-sodium soups. Unsalted popcorn and pretzels. Low-salt or salt-free chips. What foods are not recommended? The items listed may not be a complete list. Talk with your dietitian about what dietary choices are best for you. Grains Instant hot cereals. Bread stuffing, pancake, and biscuit mixes. Croutons. Seasoned rice or pasta mixes. Noodle soup cups. Boxed or frozen macaroni and cheese. Regular salted crackers. Self-rising flour. Vegetables Sauerkraut, pickled vegetables, and relishes. Olives. [place] fries. Onion rings. Regular canned vegetables (not low-sodium or reduced-sodium). Regular  canned tomato sauce and paste (not low-sodium or reduced-sodium). Regular tomato and vegetable juice (not low-sodium or reduced-sodium). Frozen vegetables in sauces. Meats and other protein foods Meat or fish that is salted, canned, smoked, spiced, or pickled. Bacon, ham, sausage, hotdogs, corned beef, chipped beef, packaged lunch meats, salt pork, jerky, pickled herring, anchovies, regular canned tuna, sardines, salted nuts. Dairy Processed cheese and cheese spreads. Cheese curds. Blue cheese. Feta cheese. String cheese. Regular cottage cheese. Buttermilk. Canned milk. Fats and oils Salted butter. Regular margarine. Ghee. Bacon fat. Seasonings and other foods Onion salt, garlic salt, seasoned salt, table salt, and sea salt. Canned and packaged gravies. Worcestershire sauce. Tartar sauce. Barbecue sauce. Teriyaki sauce. Soy sauce, including reduced-sodium. Steak sauce. Fish sauce. Oyster sauce. Cocktail sauce. Horseradish that you find on the shelf. Regular ketchup and mustard. Meat flavorings and tenderizers. Bouillon cubes. Hot sauce and Tabasco sauce. Premade or packaged marinades. Premade or packaged taco seasonings. Relishes. Regular salad dressings. Salsa. Potato and tortilla chips. Corn chips and puffs. Salted popcorn and pretzels. Canned or dried soups. Pizza. Frozen entrees and pot pies. Summary  Eating less sodium can help lower your blood pressure, reduce swelling, and protect your heart, liver, and kidneys.  Most people on this plan should limit their sodium intake to 1,500-2,000 mg (milligrams) of sodium each day.  Canned, boxed, and frozen foods are high in sodium. Restaurant foods, fast foods, and  pizza are also very high in sodium. You also get sodium by adding salt to food.  Try to cook at home, eat more fresh fruits and vegetables, and eat less fast food, canned, processed, or prepared foods. This information is not intended to replace advice given to you by your health care  provider. Make sure you discuss any questions you have with your health care provider. Document Revised: 02/15/2017 Document Reviewed: 02/27/2016 Elsevier Patient Education  [place] Heart-healthy meal planning includes:  Eating less unhealthy fats.  Eating more healthy fats.  Making other changes in your diet. Talk with your doctor or a diet specialist (dietitian) to create an eating plan that is right for you. What are tips for following this plan? Cooking Avoid frying your food. Try to bake, boil, grill, or broil it instead. You can also reduce fat by:  Removing the skin from poultry.  Removing all visible fats from meats.  Steaming vegetables in water or broth. Meal planning   At meals, divide your plate into four equal parts: ? Fill one-half of your plate with vegetables and green salads. ? Fill one-fourth of your plate with whole grains. ? Fill one-fourth of your plate with lean protein foods.  Eat 4-5 servings of vegetables per day. A serving of vegetables is: ? 1 cup of raw or cooked vegetables. ? 2 cups of raw leafy greens.  Eat 4-5 servings of fruit per day. A serving of fruit is: ? 1 medium whole fruit. ?  cup of dried fruit. ?  cup of fresh, frozen, or canned fruit. ?  cup of 100% fruit juice.  Eat more foods that have soluble fiber. These are apples, broccoli, carrots, beans, peas, and barley. Try to get 20-30 g of fiber per day.  Eat 4-5 servings of nuts, legumes, and seeds per week: ? 1 serving of dried beans or legumes equals  cup after being cooked. ? 1 serving of nuts is  cup. ? 1 serving of seeds equals 1 tablespoon. General information  Eat more home-cooked food. Eat less restaurant, buffet, and fast food.  Limit or avoid alcohol.  Limit foods that are high in starch and sugar.  Avoid fried foods.  Lose weight if you are overweight.  Keep track of how much salt (sodium) you eat. This is  important if you have high blood pressure. Ask your doctor to tell you more about this.  Try to add vegetarian meals each week. Fats  Choose healthy fats. These include olive oil and canola oil, flaxseeds, walnuts, almonds, and seeds.  Eat more omega-3 fats. These include salmon, mackerel, sardines, tuna, flaxseed oil, and ground flaxseeds. Try to eat fish at least 2 times each week.  Check food labels. Avoid foods with trans fats or high amounts of saturated fat.  Limit saturated fats. ? These are often found in animal products, such as meats, butter, and cream. ? These are also found in plant foods, such as palm oil, palm kernel oil, and coconut oil.  Avoid foods with partially hydrogenated oils in them. These have trans fats. Examples are stick margarine, some tub margarines, cookies, crackers, and other baked goods. What foods can I eat? Fruits All fresh, canned (in natural juice), or frozen fruits. Vegetables Fresh or frozen vegetables (raw, steamed, roasted, or grilled). Green salads. Grains Most grains. Choose whole wheat and whole grains most of the time. Rice and pasta, including brown rice and pastas made with whole  wheat. Meats and other proteins Lean, well-trimmed beef, veal, pork, and lamb. Chicken and [place] without skin. All fish and shellfish. Wild duck, rabbit, pheasant, and venison. Egg whites or low-cholesterol egg substitutes. Dried beans, peas, lentils, and tofu. Seeds and most nuts. Dairy Low-fat or nonfat cheeses, including ricotta and mozzarella. Skim or 1% milk that is liquid, powdered, or evaporated. Buttermilk that is made with low-fat milk. Nonfat or low-fat yogurt. Fats and oils Non-hydrogenated (trans-free) margarines. Vegetable oils, including soybean, sesame, sunflower, olive, peanut, safflower, corn, canola, and cottonseed. Salad dressings or mayonnaise made with a vegetable oil. Beverages Mineral water. Coffee and tea. Diet carbonated beverages. Sweets  and desserts Sherbet, gelatin, and fruit ice. Small amounts of dark chocolate. Limit all sweets and desserts. Seasonings and condiments All seasonings and condiments. The items listed above may not be a complete list of foods and drinks you can eat. Contact a dietitian for more options. What foods should I avoid? Fruits Canned fruit in heavy syrup. Fruit in cream or butter sauce. Fried fruit. Limit coconut. Vegetables Vegetables cooked in cheese, cream, or butter sauce. Fried vegetables. Grains Breads that are made with saturated or trans fats, oils, or whole milk. Croissants. Sweet rolls. Donuts. High-fat crackers, such as cheese crackers. Meats and other proteins Fatty meats, such as hot dogs, ribs, sausage, bacon, rib-eye roast or steak. High-fat deli meats, such as salami and bologna. Caviar. Domestic duck and goose. Organ meats, such as liver. Dairy Cream, sour cream, cream cheese, and creamed cottage cheese. Whole-milk cheeses. Whole or 2% milk that is liquid, evaporated, or condensed. Whole buttermilk. Cream sauce or high-fat cheese sauce. Yogurt that is made from whole milk. Fats and oils Meat fat, or shortening. Cocoa butter, hydrogenated oils, palm oil, coconut oil, palm kernel oil. Solid fats and shortenings, including bacon fat, salt pork, lard, and butter. Nondairy cream substitutes. Salad dressings with cheese or sour cream. Beverages Regular sodas and juice drinks with added sugar. Sweets and desserts Frosting. Pudding. Cookies. Cakes. Pies. Milk chocolate or white chocolate. Buttered syrups. Full-fat ice cream or ice cream drinks. The items listed above may not be a complete list of foods and drinks to avoid. Contact a dietitian for more information. Summary  Heart-healthy meal planning includes eating less unhealthy fats, eating more healthy fats, and making other changes in your diet.  Eat a balanced diet. This includes fruits and vegetables, low-fat or nonfat dairy,  lean protein, nuts and legumes, whole grains, and heart-healthy oils and fats. This information is not intended to replace advice given to you by your health care provider. Make sure you discuss any questions you have with your health care provider. Document Revised: 05/09/2017 Document Reviewed: 04/12/2017 Elsevier Patient Education  2020 [employer].

## 2019-06-01 NOTE — Telephone Encounter (Signed)
Patient's wife states she is calling to follow up in regards to the status of a letter stating that the patient is eligible to be out of work until next week. Please return call to assist.

## 2019-06-02 NOTE — Telephone Encounter (Signed)
Juan [name] (wife) notified letter is at the front desk he will p/u 06-03-2019. Placed at the front desk for p/u

## 2019-06-14 NOTE — Progress Notes (Signed)
Cardiology Office Note   Date:  06/17/2019   ID:  Juan [name], DOB 06-19-1965, MRN [id number]  PCP:  [name], NP (Inactive)  Cardiologist:  Quay Burow, MD EP: None  Chief Complaint  Patient presents with  . Follow-up    CHF      History of Present Illness: Juan [name] is a 54 y.o. male with PMH of CAD s/p CABG in 2014 with subsequent PCI/DES x3 to SVG to RCA in 2018, chronic combined CHF (EF 45-50% 05/22/19), HTN, HLD, hypertriglyceridemia, DM type 2, who presents for follow-up of his CHF.  He was last evaluated by cardiology via a telemedicine visit with Coletta Memos, NP 06/01/19 for post-hospital follow-up for management of LE cellulitis managed with IV>PO antibiotics. He was noted to have some LE edema at that visit and his lasix was increased to 80mg  daily x3 days, then resume 40mg  daily dosing with close outpatient follow-up. His last echo was 05/22/19 which showed EF 45-50% (new from previous), with global hypokinesis, LVH, G1DD, moderately reduced RV function, moderately elevated PA pressures, with mild dilation of the aortic root (40mm). Ischemic evaluation was deferred at that time given lack of chest pain, in favor of medical management for presume ICM.   He presents today for follow-up of his CHF. He notes significant improvement in his LE edema with the increased lasix. He has had some DOE more recently which is concerning to him. This has not progressed to the severity of symptoms experienced prior to his CABG and subsequent PCI to SVG, however is somewhat reminiscent of early symptoms. He asks about when he will need a redo CABG as he was once told he would he was unlikely to live past 38. He has had no chest pain complaints, though notes he did not struggle with chest pain in the past. He has chronic orthopnea which is at baseline. No complaints of dizziness, lightheadedness, or syncope.    Past Medical History:  Diagnosis Date  . Abnormal cardiovascular  stress test 02/06/2013  . Anemia    occasional - no problems currently(10/02/2011)  . Bilateral renal cysts 06/16/2011   states no known problems  . Blood transfusion without reported diagnosis   . CAD (coronary artery disease), LAD 90%, 1st diag 95%, LCX 70% wth AV groove 90%, RCA 40-50% mid and 80% long distal stenosis 02/03/13 02/04/2013  . Chest pain    positive Myoview stress test  . Coronary artery calcification seen on CAT scan   . Diabetes mellitus    IDDM  . Fatty liver disease, nonalcoholic 123456  . Hyperlipidemia   . Hypertension    states no dx. of HTN, takes med. to protect kidneys due to DM  . Lateral meniscus tear 09/2011   left  . Loose body in knee 09/2011   loose bodies left knee  . Non Hodgkin's lymphoma ([place]) 1991  . Pancreatitis    occasional - last episode 06/2011  . S/P CABG x 4, 02/05/13 LIMA-LAD; LT. RADIAL-OM;VG-DIAG; VG-PDA 02/06/2013   10/18 3/4 patent grafts (occluded SVG-->Diag), PCI/DESx 3 SVG-->RCA, normal EF  . Splenomegaly, congestive, chronic   . Stuffy and runny nose 10/02/2011   yellow drainage from nose    Past Surgical History:  Procedure Laterality Date  . ABDOMINAL AORTIC ANEURYSM REPAIR    . ANTERIOR CERVICAL DECOMP/DISCECTOMY FUSION N/A 03/26/2018   Procedure: ANTERIOR CERVICAL DECOMPRESSION FUSION CERVICAL 5-6 WITH INSTRUMENTATION AND ALLOGRAFT;  Surgeon: Phylliss Bob, MD;  Location: [facility];  Service: Orthopedics;  Laterality: N/A;  . CORONARY ARTERY BYPASS GRAFT N/A 02/05/2013   Procedure: CORONARY ARTERY BYPASS GRAFTING (CABG);  Surgeon: Ivin Poot, MD;  Location: [place];  Service: Open Heart Surgery;  Laterality: N/A;  Coronary artery bypass graft on pump times four using left internal mammary artery and right greater saphenous vein via endovein harvest and left radial artery harvest.   . CORONARY STENT INTERVENTION  12/17/2016    PCI and drug-eluting stenting of the mid and distal RCA SVG   . CORONARY STENT INTERVENTION N/A  12/17/2016   Procedure: CORONARY STENT INTERVENTION;  Surgeon: Lorretta Harp, MD;  Location: [facility];  Service: Cardiovascular;  Laterality: N/A;  . ELBOW SURGERY    . HERNIA REPAIR     inguinal   . herniated disc    . INTRAOPERATIVE TRANSESOPHAGEAL ECHOCARDIOGRAM N/A 02/05/2013   Procedure: INTRAOPERATIVE TRANSESOPHAGEAL ECHOCARDIOGRAM;  Surgeon: Ivin Poot, MD;  Location: [facility];  Service: Open Heart Surgery;  Laterality: N/A;  . KNEE ARTHROSCOPY  10/09/2011   Procedure: ARTHROSCOPY KNEE;  Surgeon: Nita Sells, MD;  Location: [facility];  Service: Orthopedics;  Laterality: Left;  . LEFT HEART CATH AND CORS/GRAFTS ANGIOGRAPHY N/A 12/17/2016   Procedure: LEFT HEART CATH AND CORS/GRAFTS ANGIOGRAPHY;  Surgeon: Lorretta Harp, MD;  Location: [facility];  Service: Cardiovascular;  Laterality: N/A;  . LEFT HEART CATHETERIZATION WITH CORONARY ANGIOGRAM N/A 02/03/2013   Procedure: LEFT HEART CATHETERIZATION WITH CORONARY ANGIOGRAM;  Surgeon: Lorretta Harp, MD;  Location: [facility] C[facility];  Service: Cardiovascular;  Laterality: N/A;  . NASAL SEPTUM SURGERY    . RADIAL ARTERY HARVEST Left 02/05/2013   Procedure: RADIAL ARTERY HARVEST;  Surgeon: Ivin Poot, MD;  Location: [place];  Service: Vascular;  Laterality: Left;  . TIBIA BONE BIOPSY  x 3   left  . TONSILLECTOMY       Current Outpatient Medications  Medication Sig Dispense Refill  . aspirin 81 MG chewable tablet Chew 1 tablet (81 mg total) by mouth daily.    . clopidogrel (PLAVIX) 75 MG tablet TAKE 1 TABLET BY MOUTH DAILY WITH BREAKFAST. (Patient taking differently: Take 75 mg by mouth daily. ) 90 tablet 3  . FARXIGA 5 MG TABS tablet Take 5 mg by mouth daily.  11  . fenofibrate 160 MG tablet Take 160 mg by mouth daily.     . ferrous sulfate 325 (65 FE) MG tablet Take 1 tablet (325 mg total) by mouth 2 (two) times daily with a meal. 60 tablet 1  . furosemide (LASIX) 40 MG tablet TAKE 1  TABLET BY MOUTH EVERY DAY (Patient taking differently: Take 40 mg by mouth daily. ) 90 tablet 3  . Insulin Human (INSULIN PUMP) SOLN Inject into the skin.    Marland Kitchen insulin regular human CONCENTRATED (HUMULIN R) 500 UNIT/ML SOLN injection Inject into the skin continuous. Using insulin pump    . metFORMIN (GLUCOPHAGE) 1000 MG tablet Take 1,000 mg by mouth 2 (two) times daily with a meal.    . metoprolol succinate (TOPROL-XL) 25 MG 24 hr tablet Take 3 tablets (75 mg total) by mouth 2 (two) times daily. Take with or immediately following a meal. 180 tablet 1  . nitroGLYCERIN (NITROSTAT) 0.4 MG SL tablet Place 1 tablet (0.4 mg total) under the tongue every 5 (five) minutes as needed. 25 tablet 2  . Propylene Glycol (SYSTANE BALANCE) 0.6 % SOLN Place 1 drop into both eyes  daily as needed (dry eyes).    . ramipril (ALTACE) 5 MG capsule Take 5 mg by mouth daily.  4  . rosuvastatin (CRESTOR) 20 MG tablet Take 1 tablet (20 mg total) by mouth daily at 6 PM. 30 tablet 0  . VASCEPA 1 g CAPS TAKE 2 CAPSULES TWICE A DAY (Patient taking differently: Take 2 g by mouth 2 (two) times daily. ) 360 capsule 4  . isosorbide mononitrate (IMDUR) 30 MG 24 hr tablet Take 0.5 tablets (15 mg total) by mouth daily. (Patient taking differently: Take 15 mg by mouth daily as needed (chest pain). ) 45 tablet 3  . potassium chloride SA (KLOR-CON M20) 20 MEQ tablet Take 1 tablet (20 mEq total) by mouth daily for 3 days. THEN STOP 3 tablet 0   No current facility-administered medications for this visit.    Allergies:   Reglan [metoclopramide]    Social History:  The patient  reports that he has never smoked. He has never used smokeless tobacco. He reports that he does not drink alcohol or use drugs.   Family History:  The patient's family history is not on file. He was adopted.    ROS:  Please see the history of present illness.   Otherwise, review of systems are positive for none.   All other systems are reviewed and negative.     PHYSICAL EXAM: VS:  BP 114/72   Pulse 85   Ht 5\' 10"  (1.778 m)   Wt 231 lb (104.8 kg)   SpO2 98%   BMI 33.15 kg/m  , BMI Body mass index is 33.15 kg/m. GEN: Well nourished, well developed, in no acute distress HEENT: sclera anicteric Neck: no JVD, carotid bruits, or masses Cardiac: RRR; no murmurs, rubs, or gallops, 1+ LE edema  Respiratory:  clear to auscultation bilaterally, normal work of breathing GI: soft, nontender, nondistended, + BS MS: no deformity or atrophy Skin: warm and dry, no rash Neuro:  Strength and sensation are intact Psych: euthymic mood, full affect   EKG:  EKG is ordered today. The ekg ordered today demonstrates sinus rhythm with rate 85 bpm, chronic RBBB and LAD, chronic ST-T wave abnormalities, no STE/D, no significant change from previous.    Recent Labs: 05/25/2019: ALT 68; BUN 26; Creatinine, Ser 1.15; Hemoglobin 11.0; Magnesium 2.1; Platelets 90; Potassium 3.9; Sodium 138    Lipid Panel    Component Value Date/Time   CHOL 515 (H) 05/29/2018 1123   TRIG 5,975 (HH) 05/29/2018 1123   HDL 3 (L) 05/29/2018 1123   CHOLHDL 171.7 (H) 05/29/2018 1123   CHOLHDL 14.2 06/17/2011 0400   VLDL UNABLE TO CALCULATE IF TRIGLYCERIDE OVER 400 mg/dL 06/17/2011 0400   LDLCALC Comment 05/29/2018 1123   LDLDIRECT 6 05/29/2018 1123      Wt Readings from Last 3 Encounters:  06/17/19 231 lb (104.8 kg)  06/01/19 232 lb (105.2 kg)  05/22/19 242 lb 1 oz (109.8 kg)      Other studies Reviewed: Additional studies/ records that were reviewed today include:   Echocardiogram 05/22/2019 IMPRESSIONS    1. Left ventricular ejection fraction, by estimation, is 45 to 50%. The  left ventricle has mildly decreased function. The left ventricle  demonstrates global hypokinesis. There is mild left ventricular  hypertrophy. Left ventricular diastolic parameters  are consistent with Grade I diastolic dysfunction (impaired relaxation).  2. Right ventricular systolic  function is moderately reduced. The right  ventricular size is mildly enlarged. There is moderately elevated  pulmonary artery  systolic pressure. The estimated right ventricular  systolic pressure is 0000000 mmHg.  3. The mitral valve is normal in structure and function. Mild mitral  valve regurgitation. No evidence of mitral stenosis.  4. The aortic valve is tricuspid. Aortic valve regurgitation is not  visualized. Mild aortic valve sclerosis is present, with no evidence of  aortic valve stenosis.  5. Aortic dilatation noted. There is mild dilatation of the aortic root  measuring 37 mm.  6. The inferior vena cava is dilated in size with <50% respiratory  variability, suggesting right atrial pressure of 15 mmHg.  Cardiac catheterization 12/17/2016  Prox RCA to Mid RCA lesion, 80 %stenosed.  Mid RCA lesion, 80 %stenosed.  Dist RCA lesion, 95 %stenosed.  Prox Cx lesion, 95 %stenosed.  Ost 1st Mrg to 1st Mrg lesion, 95 %stenosed.  Ost Cx to Prox Cx lesion, 70 %stenosed.  Mid Cx to Dist Cx lesion, 80 %stenosed.  Ost 1st Diag to 1st Diag lesion, 90 %stenosed.  Ost 2nd Diag to 2nd Diag lesion, 90 %stenosed.  Mid LAD to Dist LAD lesion, 90 %stenosed.  Dist LAD lesion, 100 %stenosed.  LIMA.  Left radial artery.  SVG.  Origin to Prox Graft lesion, 100 %stenosed.  SVG and is large and anatomically normal.  Mid Graft lesion, 80 %stenosed.  Prox Graft lesion, 80 %stenosed.  Post intervention, there is a 0% residual stenosis.  A stent was successfully placed.  Dist Graft to Insertion lesion, 95 %stenosed.  Post intervention, there is a 0% residual stenosis.  A stent was successfully placed.  The left ventricular systolic function is normal.  LV end diastolic pressure is normal.  The left ventricular ejection fraction is 50-55% by visual estimate.     ASSESSMENT AND PLAN:   1. Chronic combined CHF: LE edema has improve with increased lasix. We discussed  the importance of daily weights in an effort to stay ahead of swelling/SOB.  - Continue metoprolol succinate and lasix  - We discussed taking additional lasix as needed for weight gain or significant LE edema - Encouraged daily weights and compliance with a low sodium diet.   2. CAD s/p CABG in 2014 with subsequent PCI/DES x3 to SVG-RCA in 2018: no chest pain complaints, though has noticed an increase in DOE in recent months which is somewhat reminiscent of symptoms prior to CABG/PCI to SVG, though not as severe. EKG is without ischemic changes today; stable from previous.  - Will update a NST to evaluate anginal equivalent - Continue aspirin, plavix, and statin - Continue metoprolol succinate  3. HTN: BP 114/72 today - Continue metoprolol and ramipril  4. HLD: Followed by Dr. Debara Pickett for lipid management; lipids 11/2018 with T cholesterol 520, HDL 3, Triglycerides 5672. Due for follow-up with Lipid Clinic - Will schedule lipid clinic follow-up with Dr. Debara Pickett - Continue crestor, fenofibrate, and vascepa  5. DM type 2: A1C 8.5 05/21/19. He follows with an endocrinologist. We discussed that he may benefit from starting Jardiance. He wishes to speak with his endocrinologist prior to making changes - Continue metformin and insulin for now - Recommend discussing Jardiance with his endocrinologist given CAD and CHF history.    Current medicines are reviewed at length with the patient today.  The patient does not have concerns regarding medicines.  The following changes have been made:  As above  Labs/ tests ordered today include:   Orders Placed This Encounter  Procedures  . Basic Metabolic Panel (BMET)  . Myocardial Perfusion Imaging  .  EKG 12-Lead     Disposition:   FU with myself or Dr. Gwenlyn Found in 2 months. Follow-up with Dr. Debara Pickett in the lipid clinic within 1 month.   Signed, Abigail Butts, PA-C  06/17/2019 11:04 AM

## 2019-06-17 ENCOUNTER — Encounter: Payer: Self-pay | Admitting: Cardiovascular Disease

## 2019-06-17 ENCOUNTER — Ambulatory Visit: Payer: Commercial Managed Care - PPO | Admitting: Medical

## 2019-06-17 ENCOUNTER — Encounter: Payer: Self-pay | Admitting: Medical

## 2019-06-17 ENCOUNTER — Other Ambulatory Visit: Payer: Self-pay

## 2019-06-17 VITALS — BP 114/72 | HR 85 | Ht 70.0 in | Wt 231.0 lb

## 2019-06-17 DIAGNOSIS — Z951 Presence of aortocoronary bypass graft: Secondary | ICD-10-CM

## 2019-06-17 DIAGNOSIS — I25119 Atherosclerotic heart disease of native coronary artery with unspecified angina pectoris: Secondary | ICD-10-CM | POA: Diagnosis not present

## 2019-06-17 DIAGNOSIS — Z794 Long term (current) use of insulin: Secondary | ICD-10-CM

## 2019-06-17 DIAGNOSIS — E781 Pure hyperglyceridemia: Secondary | ICD-10-CM | POA: Diagnosis not present

## 2019-06-17 DIAGNOSIS — I10 Essential (primary) hypertension: Secondary | ICD-10-CM

## 2019-06-17 DIAGNOSIS — E11620 Type 2 diabetes mellitus with diabetic dermatitis: Secondary | ICD-10-CM

## 2019-06-17 DIAGNOSIS — I5032 Chronic diastolic (congestive) heart failure: Secondary | ICD-10-CM

## 2019-06-17 LAB — BASIC METABOLIC PANEL
BUN/Creatinine Ratio: 24 — ABNORMAL HIGH (ref 9–20)
BUN: 29 mg/dL — ABNORMAL HIGH (ref 6–24)
CO2: 21 mmol/L (ref 20–29)
Calcium: 10.5 mg/dL — ABNORMAL HIGH (ref 8.7–10.2)
Chloride: 101 mmol/L (ref 96–106)
Creatinine, Ser: 1.19 mg/dL (ref 0.76–1.27)
GFR calc Af Amer: 81 mL/min/{1.73_m2} (ref >59)
GFR calc non Af Amer: 70 mL/min/{1.73_m2} (ref >59)
Glucose: 142 mg/dL — ABNORMAL HIGH (ref 65–99)
Potassium: 4.5 mmol/L (ref 3.5–5.2)
Sodium: 134 mmol/L (ref 134–144)

## 2019-06-17 MED ORDER — METOPROLOL SUCCINATE ER 25 MG PO TB24: 75.0000 mg | ORAL_TABLET | Freq: Two times a day (BID) | ORAL | 1 refills | Status: DC

## 2019-06-17 NOTE — Patient Instructions (Addendum)
Medication Instructions:  Your physician recommends that you continue on your current medications as directed. Please refer to the Current Medication list given to you today.  *If you need a refill on your cardiac medications before your next appointment, please call your pharmacy*   Lab Work: BMET TODAY   If you have labs (blood work) drawn today and your tests are completely normal, you will receive your results only by: Marland Kitchen MyChart Message (if you have MyChart) OR . A paper copy in the mail If you have any lab test that is abnormal or we need to change your treatment, we will call you to review the results.   Testing/Procedures: Your physician has requested that you have a lexiscan myoview. For further information please visit [email or web address]. Please follow instruction sheet, as given.  Follow-Up: At [facility], you and your health needs are our priority.  As part of our continuing mission to provide you with exceptional heart care, we have created designated Provider Care Teams.  These Care Teams include your primary Cardiologist (physician) and Advanced Practice Providers (APPs -  Physician Assistants and Nurse Practitioners) who all work together to provide you with the care you need, when you need it.  We recommend signing up for the patient portal called "MyChart".  Sign up information is provided on this After Visit Summary.  MyChart is used to connect with patients for Virtual Visits (Telemedicine).  Patients are able to view lab/test results, encounter notes, upcoming appointments, etc.  Non-urgent messages can be sent to your provider as well.   To learn more about what you can do with MyChart, go to [email or web address].    Your next appointment:   2 month(s)  The format for your next appointment:   In Person  Provider:   You may see Quay Burow, MD or one of the following Advanced Practice Providers on your designated Care Team:    Ester Mabe. Daleen Snook  PA-C   Next available with Dr. Debara Pickett for Lipid   Other Instruction: Please talk with your endocrinologist about starting a medication called JARDIANCE for managing your diabetes which has added benefits for patients with known heart disease and heart failure.

## 2019-06-18 ENCOUNTER — Other Ambulatory Visit: Payer: Self-pay

## 2019-06-18 MED ORDER — METOPROLOL SUCCINATE ER 25 MG PO TB24: 75.0000 mg | ORAL_TABLET | Freq: Two times a day (BID) | ORAL | 3 refills | Status: DC

## 2019-06-18 MED ORDER — ROSUVASTATIN CALCIUM 20 MG PO TABS: 20.0000 mg | ORAL_TABLET | Freq: Every day | ORAL | 3 refills | Status: DC

## 2019-06-18 MED ORDER — NITROGLYCERIN 0.4 MG SL SUBL: 0.4000 mg | SUBLINGUAL_TABLET | SUBLINGUAL | 2 refills | Status: DC | PRN

## 2019-06-19 ENCOUNTER — Telehealth (HOSPITAL_COMMUNITY): Payer: Self-pay

## 2019-06-19 NOTE — Telephone Encounter (Signed)
Encounter complete. 

## 2019-06-25 ENCOUNTER — Telehealth (HOSPITAL_COMMUNITY): Payer: Self-pay | Admitting: *Deleted

## 2019-06-25 NOTE — Telephone Encounter (Signed)
Close encounter 

## 2019-06-26 ENCOUNTER — Other Ambulatory Visit: Payer: Self-pay

## 2019-06-26 ENCOUNTER — Encounter (INDEPENDENT_AMBULATORY_CARE_PROVIDER_SITE_OTHER): Payer: Commercial Managed Care - PPO | Admitting: Ophthalmology

## 2019-06-26 ENCOUNTER — Ambulatory Visit (HOSPITAL_COMMUNITY)
Admission: RE | Admit: 2019-06-26 | Discharge: 2019-06-26 | Disposition: A | Discharge status: Home / Self Care | Payer: Commercial Managed Care - PPO | Source: Ambulatory Visit | Attending: Cardiovascular Disease | Admitting: Cardiovascular Disease

## 2019-06-26 DIAGNOSIS — I25119 Atherosclerotic heart disease of native coronary artery with unspecified angina pectoris: Secondary | ICD-10-CM

## 2019-06-26 LAB — MYOCARDIAL PERFUSION IMAGING
LV dias vol: 163 mL (ref 62–150)
LV sys vol: 87 mL
Peak HR: 86 {beats}/min
Rest HR: 83 {beats}/min
SDS: 1
SRS: 10
SSS: 11
TID: 1.13

## 2019-06-26 MED ORDER — TECHNETIUM TC 99M TETROFOSMIN IV KIT
31.8000 | PACK | Freq: Once | INTRAVENOUS | Status: AC | PRN
  Administered 2019-06-26: 31.8 via INTRAVENOUS
  Filled 2019-06-26: qty 32

## 2019-06-26 MED ORDER — REGADENOSON 0.4 MG/5ML IV SOLN
0.4000 mg | Freq: Once | INTRAVENOUS | Status: AC
  Administered 2019-06-26: 0.4 mg via INTRAVENOUS

## 2019-06-26 MED ORDER — TECHNETIUM TC 99M TETROFOSMIN IV KIT
10.4000 | PACK | Freq: Once | INTRAVENOUS | Status: AC | PRN
  Administered 2019-06-26: 10.4 via INTRAVENOUS
  Filled 2019-06-26: qty 11

## 2019-06-26 MED ORDER — AMINOPHYLLINE 25 MG/ML IV SOLN
75.0000 mg | Freq: Once | INTRAVENOUS | Status: AC
  Administered 2019-06-26: 75 mg via INTRAVENOUS

## 2019-07-08 ENCOUNTER — Ambulatory Visit: Payer: Commercial Managed Care - PPO | Admitting: Cardiovascular Disease

## 2019-07-08 ENCOUNTER — Other Ambulatory Visit: Payer: Self-pay

## 2019-07-08 ENCOUNTER — Encounter: Payer: Self-pay | Admitting: Cardiovascular Disease

## 2019-07-08 DIAGNOSIS — I10 Essential (primary) hypertension: Secondary | ICD-10-CM

## 2019-07-08 DIAGNOSIS — E785 Hyperlipidemia, unspecified: Secondary | ICD-10-CM | POA: Diagnosis not present

## 2019-07-08 DIAGNOSIS — Z951 Presence of aortocoronary bypass graft: Secondary | ICD-10-CM | POA: Diagnosis not present

## 2019-07-08 DIAGNOSIS — I4510 Unspecified right bundle-branch block: Secondary | ICD-10-CM | POA: Diagnosis not present

## 2019-07-08 NOTE — Assessment & Plan Note (Signed)
History of essential hypertension blood pressure measured at 122/74.  He is on metoprolol and ramipril.

## 2019-07-08 NOTE — Patient Instructions (Signed)
Medication Instructions:  Your physician recommends that you continue on your current medications as directed. Please refer to the Current Medication list given to you today.  *If you need a refill on your cardiac medications before your next appointment, please call your pharmacy*  Follow-Up: At [facility], you and your health needs are our priority.  As part of our continuing mission to provide you with exceptional heart care, we have created designated Provider Care Teams.  These Care Teams include your primary Cardiologist (physician) and Advanced Practice Providers (APPs -  Physician Assistants and Nurse Practitioners) who all work together to provide you with the care you need, when you need it.  We recommend signing up for the patient portal called "MyChart".  Sign up information is provided on this After Visit Summary.  MyChart is used to connect with patients for Virtual Visits (Telemedicine).  Patients are able to view lab/test results, encounter notes, upcoming appointments, etc.  Non-urgent messages can be sent to your provider as well.   To learn more about what you can do with MyChart, go to [email or web address].    Your next appointment:   3 month(s) with Roby Lofts PA 6 months with Dr. Gwenlyn Found

## 2019-07-08 NOTE — Assessment & Plan Note (Signed)
History of dyslipidemia on Crestor, fenofibrate, Vascepa with lipid profile performed 12/11/2018 revealing cholesterol 520, LDL 112 triglyceride level of 5600 followed by Dr. Debara Pickett.

## 2019-07-08 NOTE — Assessment & Plan Note (Signed)
History of CAD status post CABG x4 by Dr. Darcey Nora 02/03/2013 with a LIMA to his LAD, left radial to noxious marginal branch, vein to diagonal branch and to the PDA.  Because of chest pain and left upper extremity pain I performed cardiac catheterization on him 12/17/2016 revealing high-grade disease within the RCA vein graft which I stented.  He has done well since.  He had normal LV function at that time and a Myoview performed 2017 showed normal perfusion.  His most recent 2D echo performed during his recent hospitalization for cellulitis and sepsis revealed an EF of 45 to 50%, slightly lower than it had been before and a Myoview stress test performed 06/26/2019 showed inferior scar without ischemia.  This was a new finding.  He has very rare if any chest pain.  It is possible that the vein graft has occluded silently in the past.  His troponins were up in the 4000 range during his hospitalization but this was thought to be demand ischemia by Dr. Johnsie Cancel.

## 2019-07-08 NOTE — Progress Notes (Signed)
07/08/2019 [name]   06/29/1965  [id number]  Primary Physician Patient, No Pcp Per Primary Cardiologist: Juan Harp MD Juan [name], [place], [place]  HPI:  Juan [name] is a 54 y.o.  mild to moderately overweight, married Caucasian male, father of 1 child who I last spoke to on the phone for a virtual telemedicine phone visit 08/19/2018.  He has seen Dr. Sallyanne [name] twice in 2011. He has a long history of insulin-dependent diabetes as well as extremely high hypertriglyceridemia in the 3000-6000 range with multiple episodes of pancreatitis. He had negative venous Dopplers for DVTs. He does wear compression stockings. He has complained of left inframammary chest pain and increasing shortness of breath. He is not aware of his family history since he was adopted. He had a CT scan of his chest and abdomen which showed a mass at the tail of his pancreas but also showed severe calcification in the LAD. He did have a Myoview stress test performed 2 years ago that was nonischemic. At that time, he had no symptoms of chest pain or shortness of breath. I referred him to Dr. Lavetta [name] at [facility] for more intense treatment of his severe hypertriglyceridemia.since I saw him last in December of last several weeks he developed exertional chest pain and shortness of breath.I was concerned that he has developed progression of disease given all of his risk factors.  He had a Myoview stress test performed that showed inferior ischemia which is high risk. He continued to have exertional chest pain with left upper irradiation.I performed cardiac catheterization on him 02/03/13 revealing three-vessel disease and preserved LV function. He subsequently underwent coronary artery bypass bypass grafting x4 by Dr. Tharon Aquas [name] with a LIMA to his LAD, a left radial to obtuse marginal branch, vein graft to an diagonal branch and PDA. His postop course was uncomplicated. He completed 7 weeks her cardiac  rehabilitation and is back to work. Since I saw him in the office 4 months ago he has seen Juan [name] back who ordered a 2-D echo that showed normal LV systolic function, grade 2 diastolic dysfunction and a Myoview stress test that was normal as well.  Saw Juan [name] last in October he is complaining of chest pain and left upper extremity pain. Based on this I performed outpatient cardiac catheterization on him 12/17/16 revealing high-grade disease in his RCA SVG which is stented using several synergy drug-eluting stents. This resulted in resolution of his anginal symptoms. He also has recurrent cellulitis of his right lower extremity on antibiotics with chronic lower extremity edema for which she uses compression stockings.  Since I spoke to him back in June for his telemedicine phone visit he was admitted last month with cellulitis and sepsis, acute renal insufficiency and demand ischemia with positive enzymes in the 4000 range.  2D echo during cephalization revealed EF of 45 to 50%.  A Myoview stress test performed 06/26/2019 revealed inferior scar without ischemia.  This was a new finding compared to his last Myoview in 2017.  It certainly possible that the vein graft which I stented 10/18 has occluded although he is minimally symptomatic from this.  He continues to follow with Juan [name] for his hypertriglyceridemia.   Current Meds  Medication Sig  . aspirin 81 MG chewable tablet Chew 1 tablet (81 mg total) by mouth daily.  . benzonatate (TESSALON) 200 MG capsule Take by mouth.  . clopidogrel (PLAVIX) 75 MG tablet TAKE 1 TABLET  BY MOUTH DAILY WITH BREAKFAST. (Patient taking differently: Take 75 mg by mouth daily. )  . fenofibrate 160 MG tablet Take 160 mg by mouth daily.   . ferrous sulfate 325 (65 FE) MG tablet Take 1 tablet (325 mg total) by mouth 2 (two) times daily with a meal.  . furosemide (LASIX) 40 MG tablet TAKE 1 TABLET BY MOUTH EVERY DAY (Patient taking differently: Take 40 mg by mouth  daily. )  . Insulin Human (INSULIN PUMP) SOLN Inject into the skin.  Marland Kitchen insulin regular human CONCENTRATED (HUMULIN R) 500 UNIT/ML SOLN injection Inject into the skin continuous. Using insulin pump  . metFORMIN (GLUCOPHAGE) 1000 MG tablet Take 1,000 mg by mouth 2 (two) times daily with a meal.  . metoprolol succinate (TOPROL-XL) 25 MG 24 hr tablet Take 3 tablets (75 mg total) by mouth 2 (two) times daily. Take with or immediately following a meal.  . nitroGLYCERIN (NITROSTAT) 0.4 MG SL tablet Place 1 tablet (0.4 mg total) under the tongue every 5 (five) minutes as needed.  Marland Kitchen Propylene Glycol (SYSTANE BALANCE) 0.6 % SOLN Place 1 drop into both eyes daily as needed (dry eyes).  . ramipril (ALTACE) 5 MG capsule Take 5 mg by mouth daily.  . rosuvastatin (CRESTOR) 20 MG tablet Take 1 tablet (20 mg total) by mouth daily at 6 PM.  . VASCEPA 1 g CAPS TAKE 2 CAPSULES TWICE A DAY (Patient taking differently: Take 2 g by mouth 2 (two) times daily. )  . [DISCONTINUED] FARXIGA 5 MG TABS tablet Take 5 mg by mouth daily.     Allergies  Allergen Reactions  . Reglan [Metoclopramide] Other (See Comments)    Only can tolerate in low doses, in higher doses it has the opposite effect    Social History   Socioeconomic History  . Marital status: Married    Spouse name: Juan [name]  . Number of children: 1  . Years of education: 49  . Highest education level: Not on file  Occupational History  . Occupation: Engineer, drilling     Employer: [employer]  Tobacco Use  . Smoking status: Never Smoker  . Smokeless tobacco: Never Used  Substance and Sexual Activity  . Alcohol use: No  . Drug use: No  . Sexual activity: Yes  Other Topics Concern  . Not on file  Social History Narrative   Adopted, so no pertinent FH.  Married.  Lives with wife in [place].  Independent of ADLs and ambulation.   Social Determinants of Health   Financial Resource Strain:   . Difficulty of Paying Living Expenses:    Food Insecurity:   . Worried About Charity fundraiser in the Last Year:   . Arboriculturist in the Last Year:   Transportation Needs:   . Film/video editor (Medical):   Marland Kitchen Lack of Transportation (Non-Medical):   Physical Activity:   . Days of Exercise per Week:   . Minutes of Exercise per Session:   Stress:   . Feeling of Stress :   Social Connections:   . Frequency of Communication with Friends and Family:   . Frequency of Social Gatherings with Friends and Family:   . Attends Religious Services:   . Active Member of Clubs or Organizations:   . Attends Archivist Meetings:   Marland Kitchen Marital Status:   Intimate Partner Violence:   . Fear of Current or Ex-Partner:   . Emotionally Abused:   Marland Kitchen Physically Abused:   .  Sexually Abused:      Review of Systems: General: negative for chills, fever, night sweats or weight changes.  Cardiovascular: negative for chest pain, dyspnea on exertion, edema, orthopnea, palpitations, paroxysmal nocturnal dyspnea or shortness of breath Dermatological: negative for rash Respiratory: negative for cough or wheezing Urologic: negative for hematuria Abdominal: negative for nausea, vomiting, diarrhea, bright red blood per rectum, melena, or hematemesis Neurologic: negative for visual changes, syncope, or dizziness All other systems reviewed and are otherwise negative except as noted above.    Blood pressure 122/74, pulse 92, height 5\' 10"  (1.778 m), weight 227 lb 9.6 oz (103.2 kg), SpO2 97 %.  General appearance: alert and no distress Neck: no adenopathy, no carotid bruit, no JVD, supple, symmetrical, trachea midline and thyroid not enlarged, symmetric, no tenderness/mass/nodules Lungs: clear to auscultation bilaterally Heart: regular rate and rhythm, S1, S2 normal, no murmur, click, rub or gallop Extremities: extremities normal, atraumatic, no cyanosis or edema Pulses: 2+ and symmetric Skin: Skin color, texture, turgor normal. No rashes  or lesions Neurologic: Alert and oriented X 3, normal strength and tone. Normal symmetric reflexes. Normal coordination and gait  EKG not performed today  ASSESSMENT AND PLAN:   Dyslipidemia History of dyslipidemia on Crestor, fenofibrate, Vascepa with lipid profile performed 12/11/2018 revealing cholesterol 520, LDL 112 triglyceride level of 5600 followed by Juan [name].  S/P CABG x 4 History of CAD status post CABG x4 by Dr. Darcey Nora 02/03/2013 with a LIMA to his LAD, left radial to noxious marginal branch, vein to diagonal branch and to the PDA.  Because of chest pain and left upper extremity pain I performed cardiac catheterization on him 12/17/2016 revealing high-grade disease within the RCA vein graft which I stented.  He has done well since.  He had normal LV function at that time and a Myoview performed 2017 showed normal perfusion.  His most recent 2D echo performed during his recent hospitalization for cellulitis and sepsis revealed an EF of 45 to 50%, slightly lower than it had been before and a Myoview stress test performed 06/26/2019 showed inferior scar without ischemia.  This was a new finding.  He has very rare if any chest pain.  It is possible that the vein graft has occluded silently in the past.  His troponins were up in the 4000 range during his hospitalization but this was thought to be demand ischemia by Dr. Johnsie Cancel.  Right bundle branch block Chronic  HTN (hypertension) History of essential hypertension blood pressure measured at 122/74.  He is on metoprolol and ramipril.      Juan Harp MD FACP,FACC,FAHA, [facility] 07/08/2019 11:36 AM

## 2019-07-08 NOTE — Assessment & Plan Note (Signed)
Chronic. 

## 2019-07-30 DIAGNOSIS — E113391 Type 2 diabetes mellitus with moderate nonproliferative diabetic retinopathy without macular edema, right eye: Secondary | ICD-10-CM | POA: Insufficient documentation

## 2019-07-30 DIAGNOSIS — E083312 Diabetes mellitus due to underlying condition with moderate nonproliferative diabetic retinopathy with macular edema, left eye: Secondary | ICD-10-CM | POA: Insufficient documentation

## 2019-07-30 DIAGNOSIS — H35012 Changes in retinal vascular appearance, left eye: Secondary | ICD-10-CM | POA: Insufficient documentation

## 2019-08-10 ENCOUNTER — Encounter: Payer: Self-pay | Admitting: Internal Medicine

## 2019-08-10 ENCOUNTER — Other Ambulatory Visit: Payer: Self-pay

## 2019-08-10 ENCOUNTER — Ambulatory Visit: Payer: Commercial Managed Care - PPO | Admitting: Internal Medicine

## 2019-08-10 VITALS — BP 120/60 | HR 80 | Temp 97.0°F | Ht 70.0 in | Wt 229.0 lb

## 2019-08-10 DIAGNOSIS — Z951 Presence of aortocoronary bypass graft: Secondary | ICD-10-CM | POA: Diagnosis not present

## 2019-08-10 DIAGNOSIS — E783 Hyperchylomicronemia: Secondary | ICD-10-CM | POA: Diagnosis not present

## 2019-08-10 DIAGNOSIS — I5032 Chronic diastolic (congestive) heart failure: Secondary | ICD-10-CM

## 2019-08-10 NOTE — Progress Notes (Signed)
LIPID CLINIC CONSULT NOTE  Chief Complaint:  Leg swelling  Primary Care Physician: Patient, No Pcp Per  HPI:  Juan [name] is a 55 y.o. male who is being seen today for the evaluation of high triglycerides at the request of No ref. provider found.  Juan [name] is seen today for evaluation of elevated triglycerides.  He has been a patient of Dr. Lavetta Nielsen at [facility] for many years.  He was last seen there in 2018.  He has hyperchylomicronemia.  Triglycerides have been very high and most recently up to 4730.  He says at best his triglycerides have come down to 450, but this was after very strict low saturated fat and calorie diet.  He says he has not been able to maintain that.  He also has diabetes and his blood sugars have not been optimally controlled.  His most recent A1c in September 2019 was 9.6.  He sees [name].  He has been on fenofibrate 160 mg.  In addition he takes low-dose simvastatin 10 mg.  He has a history of steatohepatitis and elevated liver enzymes in the past however most recently his ALT was 62 in September.  He also has clinical coronary disease having been found to have multivessel coronary disease in 2014 and subsequently he underwent coronary artery bypass grafting and has had multivessel PCI.  He was previously considered for niacin therapy however never really took that because of not being able to achieve good glycemic control.  He is not previously been on any omega-3's and had been considered for clinical trials but was not enrolled.  06/02/2018  Juan [name] returns today for follow-up of elevated triglycerides.  Fortunately, he has had a significant increase in his triglycerides since we last saw him.  His most recent labs showed total cholesterol of 515 with triglycerides of 5975.  HDL was 3 and LDL was 6 indicating persistent familial chylomicronemia syndrome.  He does say that his diet has significantly altered recently due to increased demands at work as well as  increased demands on his wife who works in a school [employer].  Although schools are now close due to the COVID-19 virus, she is now working extended hours to produce foods for children who are not at school.  He does report compliance with his medications.  12/29/2018  Juan [name] is seen today for follow-up.  Overall he seems to be doing well without any new chest pain or worsening shortness of breath.  Unfortunate his triglycerides remain elevated.  They have come down some in the mid 3600s.  His hemoglobin A1c is improved as well in the low sevens.  Unfortunately, were not able to get his triglycerides to be normalized at this point.  He is on a strict diet to lower saturated fats.  He is on maximal therapy at this point with fibrate and Vascepa.  We will need to investigate a possible clinical trial options for him.  LDL has been as low as 6.  08/10/2019  Juan [name] returns for follow-up.  Unfortunately spring was hospitalized with cellulitis/sepsis.  He was on an insulin drip and other things at that time and this may have contributed to a very low triglyceride number although previously he has had significantly elevated triglycerides and his generally never seen numbers below 800.  He continues on maximal standard therapy.  I had discussed with him previously about a clinical trial option for him and now that trial is available.  He is  a candidate for the balance trial and agreeable to participate.  Finally, he is noted to have persistent lower extremity edema.  He has seen Coletta Memos, NP about this who recommended increasing his Lasix.  I would advise increasing it to twice daily for least a couple weeks and he should have close clinical follow-up.  He still has some redness although no warmth and I suspect venous congestion of the right lower extremity.  Several rounds of antibiotics have not improved it.  He did have a ruptured blister today which I dressed.   PMHx:  Past Medical History:    Diagnosis Date  . Abnormal cardiovascular stress test 02/06/2013  . Anemia    occasional - no problems currently(10/02/2011)  . Bilateral renal cysts 06/16/2011   states no known problems  . Blood transfusion without reported diagnosis   . CAD (coronary artery disease), LAD 90%, 1st diag 95%, LCX 70% wth AV groove 90%, RCA 40-50% mid and 80% long distal stenosis 02/03/13 02/04/2013  . Chest pain    positive Myoview stress test  . Coronary artery calcification seen on CAT scan   . Diabetes mellitus    IDDM  . Fatty liver disease, nonalcoholic 123456  . Hyperlipidemia   . Hypertension    states no dx. of HTN, takes med. to protect kidneys due to DM  . Lateral meniscus tear 09/2011   left  . Loose body in knee 09/2011   loose bodies left knee  . Non Hodgkin's lymphoma ([place]) 1991  . Pancreatitis    occasional - last episode 06/2011  . S/P CABG x 4, 02/05/13 LIMA-LAD; LT. RADIAL-OM;VG-DIAG; VG-PDA 02/06/2013   10/18 3/4 patent grafts (occluded SVG-->Diag), PCI/DESx 3 SVG-->RCA, normal EF  . Splenomegaly, congestive, chronic   . Stuffy and runny nose 10/02/2011   yellow drainage from nose    Past Surgical History:  Procedure Laterality Date  . ABDOMINAL AORTIC ANEURYSM REPAIR    . ANTERIOR CERVICAL DECOMP/DISCECTOMY FUSION N/A 03/26/2018   Procedure: ANTERIOR CERVICAL DECOMPRESSION FUSION CERVICAL 5-6 WITH INSTRUMENTATION AND ALLOGRAFT;  Surgeon: Phylliss Bob, MD;  Location: [place];  Service: Orthopedics;  Laterality: N/A;  . CORONARY ARTERY BYPASS GRAFT N/A 02/05/2013   Procedure: CORONARY ARTERY BYPASS GRAFTING (CABG);  Surgeon: Ivin Poot, MD;  Location: [place];  Service: Open Heart Surgery;  Laterality: N/A;  Coronary artery bypass graft on pump times four using left internal mammary artery and right greater saphenous vein via endovein harvest and left radial artery harvest.   . CORONARY STENT INTERVENTION  12/17/2016    PCI and drug-eluting stenting of the mid and distal RCA  SVG   . CORONARY STENT INTERVENTION N/A 12/17/2016   Procedure: CORONARY STENT INTERVENTION;  Surgeon: Lorretta Harp, MD;  Location: [place] [facility];  Service: Cardiovascular;  Laterality: N/A;  . ELBOW SURGERY    . HERNIA REPAIR     inguinal   . herniated disc    . INTRAOPERATIVE TRANSESOPHAGEAL ECHOCARDIOGRAM N/A 02/05/2013   Procedure: INTRAOPERATIVE TRANSESOPHAGEAL ECHOCARDIOGRAM;  Surgeon: Ivin Poot, MD;  Location: [place];  Service: Open Heart Surgery;  Laterality: N/A;  . KNEE ARTHROSCOPY  10/09/2011   Procedure: ARTHROSCOPY KNEE;  Surgeon: Nita Sells, MD;  Location: [place];  Service: Orthopedics;  Laterality: Left;  . LEFT HEART CATH AND CORS/GRAFTS ANGIOGRAPHY N/A 12/17/2016   Procedure: LEFT HEART CATH AND CORS/GRAFTS ANGIOGRAPHY;  Surgeon: Lorretta Harp, MD;  Location: [place] [facility];  Service:  Cardiovascular;  Laterality: N/A;  . LEFT HEART CATHETERIZATION WITH CORONARY ANGIOGRAM N/A 02/03/2013   Procedure: LEFT HEART CATHETERIZATION WITH CORONARY ANGIOGRAM;  Surgeon: Lorretta Harp, MD;  Location: [facility] CATH LAB;  Service: Cardiovascular;  Laterality: N/A;  . NASAL SEPTUM SURGERY    . RADIAL ARTERY HARVEST Left 02/05/2013   Procedure: RADIAL ARTERY HARVEST;  Surgeon: Ivin Poot, MD;  Location: [place];  Service: Vascular;  Laterality: Left;  . TIBIA BONE BIOPSY  x 3   left  . TONSILLECTOMY      FAMHx:  Family History  Adopted: Yes    SOCHx:   reports that he has never smoked. He has never used smokeless tobacco. He reports that he does not drink alcohol or use drugs.  ALLERGIES:  Allergies  Allergen Reactions  . Reglan [Metoclopramide] Other (See Comments)    Only can tolerate in low doses, in higher doses it has the opposite effect    ROS: Pertinent items noted in HPI and remainder of comprehensive ROS otherwise negative.  HOME MEDS: Current Outpatient Medications on File Prior to Visit  Medication Sig Dispense  Refill  . aspirin 81 MG chewable tablet Chew 1 tablet (81 mg total) by mouth daily.    . clopidogrel (PLAVIX) 75 MG tablet TAKE 1 TABLET BY MOUTH DAILY WITH BREAKFAST. (Patient taking differently: Take 75 mg by mouth daily. ) 90 tablet 3  . fenofibrate 160 MG tablet Take 160 mg by mouth daily.     . ferrous sulfate 325 (65 FE) MG tablet Take 1 tablet (325 mg total) by mouth 2 (two) times daily with a meal. 60 tablet 1  . furosemide (LASIX) 40 MG tablet TAKE 1 TABLET BY MOUTH EVERY DAY (Patient taking differently: Take 40 mg by mouth daily. ) 90 tablet 3  . Insulin Human (INSULIN PUMP) SOLN Inject into the skin.    Marland Kitchen insulin regular human CONCENTRATED (HUMULIN R) 500 UNIT/ML SOLN injection Inject into the skin continuous. Using insulin pump    . metFORMIN (GLUCOPHAGE) 1000 MG tablet Take 1,000 mg by mouth 2 (two) times daily with a meal.    . metoprolol succinate (TOPROL-XL) 25 MG 24 hr tablet Take 3 tablets (75 mg total) by mouth 2 (two) times daily. Take with or immediately following a meal. 540 tablet 3  . nitroGLYCERIN (NITROSTAT) 0.4 MG SL tablet Place 1 tablet (0.4 mg total) under the tongue every 5 (five) minutes as needed. 25 tablet 2  . Propylene Glycol (SYSTANE BALANCE) 0.6 % SOLN Place 1 drop into both eyes daily as needed (dry eyes).    . ramipril (ALTACE) 5 MG capsule Take 5 mg by mouth daily.  4  . rosuvastatin (CRESTOR) 20 MG tablet Take 1 tablet (20 mg total) by mouth daily at 6 PM. 90 tablet 3  . VASCEPA 1 g CAPS TAKE 2 CAPSULES TWICE A DAY (Patient taking differently: Take 2 g by mouth 2 (two) times daily. ) 360 capsule 4  . isosorbide mononitrate (IMDUR) 30 MG 24 hr tablet Take 0.5 tablets (15 mg total) by mouth daily. (Patient taking differently: Take 15 mg by mouth daily as needed (chest pain). ) 45 tablet 3   No current facility-administered medications on file prior to visit.    LABS/IMAGING: No results found for this or any previous visit (from the past 48 hour(s)). No  results found.  LIPID PANEL:    Component Value Date/Time   CHOL 515 (H) 05/29/2018 1123   TRIG 5,975 (  HH) 05/29/2018 1123   HDL 3 (L) 05/29/2018 1123   CHOLHDL 171.7 (H) 05/29/2018 1123   CHOLHDL 14.2 06/17/2011 0400   VLDL UNABLE TO CALCULATE IF TRIGLYCERIDE OVER 400 mg/dL 06/17/2011 0400   LDLCALC Comment 05/29/2018 1123   LDLDIRECT 6 05/29/2018 1123    WEIGHTS: Wt Readings from Last 3 Encounters:  08/10/19 229 lb (103.9 kg)  07/08/19 227 lb 9.6 oz (103.2 kg)  06/26/19 231 lb (104.8 kg)    VITALS: BP 120/60   Pulse 80   Temp (!) 97 F (36.1 C)   Ht 5\' 10"  (1.778 m)   Wt 229 lb (103.9 kg)   SpO2 95%   BMI 32.86 kg/m   EXAM: General appearance: alert and no distress Neck: no carotid bruit, no JVD and thyroid not enlarged, symmetric, no tenderness/mass/nodules Lungs: clear to auscultation bilaterally Heart: regular rate and rhythm, S1, S2 normal, no murmur, click, rub or gallop Abdomen: soft, non-tender; bowel sounds normal; no masses,  no organomegaly Extremities: edema 2+ bilateral, venous stasis dermatitis noted and ruptured blister on the RLE Pulses: 2+ and symmetric Skin: multiple xanthomas Neurologic: Grossly normal Psych: Pleasant  EKG: Deferred  ASSESSMENT: 1. Familial chylomicronemia syndrome (FCS) 2. ASCVD with prior four-vessel CABG and PCI 3. Insulin-dependent diabetes 4. Hypertension  PLAN: 1.   Mr. Mchan certainly has a familial chylomicronemia syndrome.  His triglycerides were unusually low recently during hospitalization, best explained by being on insulin drip along other treatments.  They have likely rebounded as he typically runs in the thousands.  He did note some improvement in his xanthomas.  He is a very good candidate I believe for the BALANCE trial, in fact, this is the reason we got the study.  He is agreeable to proceed with the research trial and I will have our coordinator reach out to him for that.  I advised him an increase in his  Lasix to twice daily for least a couple weeks to see if the edema improves as well as wearing lower extremity compression stockings.  We will arrange for a follow-up appointment to reevaluate that with his primary cardiology team.  Pixie Casino, MD, [facility], [place] Director of the Advanced Lipid Disorders &  Cardiovascular Risk Reduction Clinic Diplomate of the American Board of Clinical Lipidology Attending Cardiologist  Direct Dial: [phone]  Fax: [phone]  Website:  [email or web address] 08/10/2019, 11:27 AM

## 2019-08-10 NOTE — Patient Instructions (Addendum)
Medication Instructions:  INCREASE YOUR LASIX (FUROSEMIDE) TO 40 MG TWICE A DAY FOR 2 WEEKS CALL THE OFFICE IF NO IMPROVEMENT   Labwork: NONE  Testing/Procedures: NONE  Follow-Up: Your physician recommends that you schedule a follow-up appointment in: DR BERRY OR JESSE C FNP   Dr. Debara Pickett recommends that you schedule a follow up visit with him the in the in 3 months.   SOMEONE WILL BE IN TOUCH WITH YOU REGARDING RESEARCH STUDY

## 2019-08-14 ENCOUNTER — Ambulatory Visit: Payer: Commercial Managed Care - PPO | Admitting: Cardiovascular Disease

## 2019-08-19 ENCOUNTER — Other Ambulatory Visit: Payer: Self-pay

## 2019-08-19 ENCOUNTER — Encounter: Payer: Commercial Managed Care - PPO | Admitting: *Deleted

## 2019-08-19 VITALS — BP 130/81 | HR 81 | Temp 97.6°F | Resp 20 | Wt 228.8 lb

## 2019-08-19 DIAGNOSIS — Z006 Encounter for examination for normal comparison and control in clinical research program: Secondary | ICD-10-CM

## 2019-08-19 NOTE — Research (Signed)
Subject Name: Juan [name]  Subject met inclusion and exclusion criteria.  The informed consent form, study requirements and expectations were reviewed with the subject and questions and concerns were addressed prior to the signing of the consent form.  The subject verbalized understanding of the trial requirements.  The subject agreed to participate in the BALANCE trial and signed the informed consent at 1615 on 08/19/19  The informed consent was obtained prior to performance of any protocol-specific procedures for the subject.  A copy of the signed informed consent was given to the subject and a copy was placed in the subject's medical record.   [name]

## 2019-08-21 ENCOUNTER — Encounter: Payer: Self-pay | Admitting: *Deleted

## 2019-08-21 DIAGNOSIS — Z006 Encounter for examination for normal comparison and control in clinical research program: Secondary | ICD-10-CM

## 2019-08-21 NOTE — Research (Addendum)
Screening run-in visit for BALANCE research study:  Subject may be excluded from study based on exclusion criteria 3, letter E in the protocol.                  Abnormal Labs:  These are the labs that are not within normal ranges: Chemistry: BUN: 35 mg/dL      [] Clinically Significant  [x] NOT Clinically Significant Calcium: 10.8 mg/dL      [] Clinically Significant  [x] NOT Clinically Significant  Uric Acid: 8.6 mg/dL      [] Clinically Significant  [x] NOT Clinically Significant Gamma Glutamyl Transferase (GGT): 136 U/L  [] Clinically Significant  [x] NOT Clinically Significant Creatine Kinase (CK): 318 U/L     [] Clinically Significant  [x] NOT Clinically Significant  Lipid Panel: HDL-Cholesterol: 15 mg/dL     [] Clinically Significant  [x] NOT Clinically Significant Triglyceride: 654 mg/dL      [] Clinically Significant  [x] NOT Clinically Significant Apoliprotein AI: 108 mg/dL      [] Clinically Significant  [x] NOT Clinically Significant  Hematology: MCV: 77.4 fL        [] Clinically Significant  [x] NOT Clinically Significant MCH: 26 pg        [] Clinically Significant  [x] NOT Clinically Significant RDW: 17.0 %        [] Clinically Significant  [x] NOT Clinically Significant Anisocytosis: 1+            [] Clinically Significant  [x] NOT Clinically Significant  Urinalysis: Blood: Trace                 [] Clinically Significant  [x] NOT Clinically Significant Protein: 100 mg/dL        [] Clinically Significant  [x] NOT Clinically Significant Mucus: 1+                      [] Clinically Significant  [x] NOT Clinically Significant  Urine Chemistry: Urine Protein: 151 mg/dL      [] Clinically Significant  [x] NOT Clinically Significant Urine Albumin: 125 mg/dL      [] Clinically Significant  [x] NOT Clinically Significant Protein Creatinine Ratio: 2359 mg/mg   [] Clinically Significant  [x] NOT Clinically Significant Albumin Creatinine Ratio: 1953 mg/g    [] Clinically Significant  [x] NOT Clinically  Significant   Pixie Casino, MD, [facility], [place] Director of the Advanced Lipid Disorders &  Cardiovascular Risk Reduction Clinic Diplomate of the American Board of Clinical Lipidology Attending Cardiologist  Direct Dial: [phone]  Fax: [phone]  Website:  [email or web address]

## 2019-09-10 ENCOUNTER — Encounter: Payer: Self-pay | Admitting: *Deleted

## 2019-09-10 DIAGNOSIS — Z006 Encounter for examination for normal comparison and control in clinical research program: Secondary | ICD-10-CM

## 2019-09-10 NOTE — Research (Addendum)
  Abnormal Labs:  [] Clinically Significant and require labs to be redrawn [] Clinically significant but do NOT require labs to be redrawn [x] Not Clinically Significant

## 2019-10-05 NOTE — Progress Notes (Signed)
Cardiology Clinic Note   Patient Name: Juan [name] Date of Encounter: 10/06/2019  Primary Care Provider:  Patient, No Pcp Per Primary Cardiologist:  Juan Burow, MD  Patient Profile    Juan [name] 54 year old male presents for follow-up of his coronary artery disease.  Status post CABG x4 (LIMA-LAD, left radial to marginal branch, SVG-diagonal, SVG-PDA (02/03/2013), cardiac catheterization 10/18 with DES x1 to RCA vein graft.    Past Medical History    Past Medical History:  Diagnosis Date  . Abnormal cardiovascular stress test 02/06/2013  . Anemia    occasional - no problems currently(10/02/2011)  . Bilateral renal cysts 06/16/2011   states no known problems  . Blood transfusion without reported diagnosis   . CAD (coronary artery disease), LAD 90%, 1st diag 95%, LCX 70% wth AV groove 90%, RCA 40-50% mid and 80% long distal stenosis 02/03/13 02/04/2013  . Chest pain    positive Myoview stress test  . Coronary artery calcification seen on CAT scan   . Diabetes mellitus    IDDM  . Fatty liver disease, nonalcoholic 04/18/8655  . Hyperlipidemia   . Hypertension    states no dx. of HTN, takes med. to protect kidneys due to DM  . Lateral meniscus tear 09/2011   left  . Loose body in knee 09/2011   loose bodies left knee  . Non Hodgkin's lymphoma ([place]) 1991  . Pancreatitis    occasional - last episode 06/2011  . S/P CABG x 4, 02/05/13 LIMA-LAD; LT. RADIAL-OM;VG-DIAG; VG-PDA 02/06/2013   10/18 3/4 patent grafts (occluded SVG-->Diag), PCI/DESx 3 SVG-->RCA, normal EF  . Splenomegaly, congestive, chronic   . Stuffy and runny nose 10/02/2011   yellow drainage from nose   Past Surgical History:  Procedure Laterality Date  . ABDOMINAL AORTIC ANEURYSM REPAIR    . ANTERIOR CERVICAL DECOMP/DISCECTOMY FUSION N/A 03/26/2018   Procedure: ANTERIOR CERVICAL DECOMPRESSION FUSION CERVICAL 5-6 WITH INSTRUMENTATION AND ALLOGRAFT;  Surgeon: Juan Bob, MD;  Location: [facility];  Service:  Orthopedics;  Laterality: N/A;  . CORONARY ARTERY BYPASS GRAFT N/A 02/05/2013   Procedure: CORONARY ARTERY BYPASS GRAFTING (CABG);  Surgeon: Juan Poot, MD;  Location: [facility];  Service: Open Heart Surgery;  Laterality: N/A;  Coronary artery bypass graft on pump times four using left internal mammary artery and right greater saphenous vein via endovein harvest and left radial artery harvest.   . CORONARY STENT INTERVENTION  12/17/2016    PCI and drug-eluting stenting of the mid and distal RCA SVG   . CORONARY STENT INTERVENTION N/A 12/17/2016   Procedure: CORONARY STENT INTERVENTION;  Surgeon: Juan Harp, MD;  Location: [facility];  Service: Cardiovascular;  Laterality: N/A;  . ELBOW SURGERY    . HERNIA REPAIR     inguinal   . herniated disc    . INTRAOPERATIVE TRANSESOPHAGEAL ECHOCARDIOGRAM N/A 02/05/2013   Procedure: INTRAOPERATIVE TRANSESOPHAGEAL ECHOCARDIOGRAM;  Surgeon: Juan Poot, MD;  Location: [facility];  Service: Open Heart Surgery;  Laterality: N/A;  . KNEE ARTHROSCOPY  10/09/2011   Procedure: ARTHROSCOPY KNEE;  Surgeon: Juan Sells, MD;  Location: [facility];  Service: Orthopedics;  Laterality: Left;  . LEFT HEART CATH AND CORS/GRAFTS ANGIOGRAPHY N/A 12/17/2016   Procedure: LEFT HEART CATH AND CORS/GRAFTS ANGIOGRAPHY;  Surgeon: Juan Harp, MD;  Location: [facility];  Service: Cardiovascular;  Laterality: N/A;  . LEFT HEART CATHETERIZATION WITH CORONARY ANGIOGRAM N/A 02/03/2013   Procedure: LEFT HEART CATHETERIZATION  WITH CORONARY ANGIOGRAM;  Surgeon: Juan Harp, MD;  Location: [facility];  Service: Cardiovascular;  Laterality: N/A;  . NASAL SEPTUM SURGERY    . RADIAL ARTERY HARVEST Left 02/05/2013   Procedure: RADIAL ARTERY HARVEST;  Surgeon: Juan Poot, MD;  Location: [facility];  Service: Vascular;  Laterality: Left;  . TIBIA BONE BIOPSY  x 3   left  . TONSILLECTOMY      Allergies  Allergies  Allergen  Reactions  . Reglan [Metoclopramide] Other (See Comments)    Only can tolerate in low doses, in higher doses it has the opposite effect    History of Present Illness    Mr. Juan [name] has a past medical history of hypertriglyceridemia, pancreatitis, coronary artery disease status post CABG x4 Juan [name] (LIMA to LAD, left radial to OM, SVG to diagonal and PDA 11/14).  Echocardiogram 05/22/2019 showed an LVEF of 45-50% with global hypokinesis, LVH, G1 DD.  Moderately reduced RV function mildly enlarged RV, and moderately elevated pulmonary pressures.  Right ventricular systolic pressure 67.8 mmHg.  Mild aortic root dilation 37 mm.  He underwent cardiac catheterization before chest pain and left upper extremity pain 10/18 which showed high-grade disease in his RCA SVG which was stented with DES x3.   His lipids are now managed by Dr. Debara [name].  History of triglyceride levels in the 6000 range.  Currently on Vascepa.  He was  admitted to the hospital 05/21/2019-05/25/2019.  He presented to the emergency department with complaints of right lower extremity swelling and erythema.  He has a history of multiple episodes of lower extremity cellulitis.  His troponins were initially elevated around 8000.  He had no EKG changes.  This was felt to be secondary to demand ischemia in the setting of sepsis with AKA, hypertension, and lactic acidosis with lower extremity cellulitis.  He was treated with IV Zosyn and vancomycin which were transitioned to p.o. Augmentin.  He was noted to have PVCs and bigeminy.  He was unaware of PVCs and his beta-blocker was increased to 75 mg twice daily.    His elevated troponins were felt to be related to demand ischemia by Dr. Johnsie [name].  Dr. Gwenlyn [name] felt it may be possible that his vein graft may also be occluded.  He was last seen by Dr. Gwenlyn [name] on 07/08/2019.  During that time he denied chest pain and was noted to be minimally symptomatic.   He was seen by me on 06/01/2019 for follow-up of his  CAD and evaluation of his lower extremity edema.  I increased his furosemide to 80 mg x 3 days and then had him resume his normal dosing of 40 mg daily.   He was seen by Dr. Debara [name] on 08/10/2019 in the lipid clinic, and was selected as a candidate for the balance trial.  He has since been excluded from trial.  He presents the clinic today and states he has had 2 occasions of chest discomfort.  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  The second was while installing a over range microwave.  He stated that on both occasions his chest discomfort dissipated/went away with rest.  When asked about his Imdur medication he stated that he was taking it as needed for chest discomfort.  He is following a low-sodium diet and does not exercise regularly.  I will have him increase his Imdur to 15 mg daily and begin walking 15 minutes/day outside of work.  I will have him follow-up in 1 month with me in 3 months with Dr. Gwenlyn [name].  I will also provide him with the elastic support stocking Harold sheet.  Today he denies chest pain, shortness of breath, increased lower extremity edema, fatigue,melena, hematuria, hemoptysis, diaphoresis, weakness, presyncope, syncope, orthopnea, and PND.    Home Medications    Prior to Admission medications   Medication Sig Start Date End Date Taking? Authorizing Provider  aspirin 81 MG chewable tablet Chew 1 tablet (81 mg total) by mouth daily. 12/19/16   Cheryln Manly, NP  clopidogrel (PLAVIX) 75 MG tablet TAKE 1 TABLET BY MOUTH DAILY WITH BREAKFAST. Patient taking differently: Take 75 mg by mouth daily.  05/18/19   Juan Harp, MD  fenofibrate 160 MG tablet Take 160 mg by mouth daily.     [provider]  ferrous sulfate 325 (65 FE) MG tablet Take 1 tablet (325 mg total) by mouth 2 (two) times daily with a meal. 05/25/19   Amin, Jeanella Flattery, MD  furosemide (LASIX) 40 MG tablet TAKE 1 TABLET BY MOUTH EVERY  DAY Patient taking differently: Take 40 mg by mouth daily.  01/06/19   Juan Harp, MD  Insulin Human (INSULIN PUMP) SOLN Inject into the skin.    [provider]  insulin regular human CONCENTRATED (HUMULIN R) 500 UNIT/ML SOLN injection Inject into the skin continuous. Using insulin pump    [provider]  isosorbide mononitrate (IMDUR) 30 MG 24 hr tablet Take 0.5 tablets (15 mg total) by mouth daily. Patient taking differently: Take 15 mg by mouth daily as needed (chest pain).  01/07/18 05/21/19  Juan Harp, MD  metFORMIN (GLUCOPHAGE) 1000 MG tablet Take 1,000 mg by mouth 2 (two) times daily with a meal.    [provider]  metoprolol succinate (TOPROL-XL) 25 MG 24 hr tablet Take 3 tablets (75 mg total) by mouth 2 (two) times daily. Take with or immediately following a meal. 06/18/19 06/12/20  Juan Harp, MD  nitroGLYCERIN (NITROSTAT) 0.4 MG SL tablet Place 1 tablet (0.4 mg total) under the tongue every 5 (five) minutes as needed. 06/18/19   Juan Harp, MD  Propylene Glycol (SYSTANE BALANCE) 0.6 % SOLN Place 1 drop into both eyes daily as needed (dry eyes).    [provider]  ramipril (ALTACE) 5 MG capsule Take 5 mg by mouth daily. 06/23/15   [provider]  rosuvastatin (CRESTOR) 20 MG tablet Take 1 tablet (20 mg total) by mouth daily at 6 PM. 06/18/19 06/12/20  Juan Harp, MD  VASCEPA 1 g CAPS TAKE 2 CAPSULES TWICE A DAY Patient taking differently: Take 2 g by mouth 2 (two) times daily.  09/16/18   Hilty, Nadean Corwin, MD    Family History    Family History  Adopted: Yes   is adopted.   Social History    Social History   Socioeconomic History  . Marital status: Married    Spouse name: [name]  . Number of children: 1  . Years of education: 60  . Highest education level: Not on file  Occupational History  . Occupation: Engineer, drilling     Employer: [employer]  Tobacco Use  . Smoking status:  Never Smoker  . Smokeless tobacco: Never Used  Vaping Use  . Vaping Use: Never used  Substance and Sexual Activity  . Alcohol use: No  . Drug use: No  . Sexual activity: Yes  Other Topics Concern  .  Not on file  Social History Narrative   Adopted, so no pertinent FH.  Married.  Lives with wife in [place].  Independent of ADLs and ambulation.   Social Determinants of Health   Financial Resource Strain:   . Difficulty of Paying Living Expenses:   Food Insecurity:   . Worried About Charity fundraiser in the Last Year:   . Arboriculturist in the Last Year:   Transportation Needs:   . Film/video editor (Medical):   Marland Kitchen Lack of Transportation (Non-Medical):   Physical Activity:   . Days of Exercise per Week:   . Minutes of Exercise per Session:   Stress:   . Feeling of Stress :   Social Connections:   . Frequency of Communication with Friends and Family:   . Frequency of Social Gatherings with Friends and Family:   . Attends Religious Services:   . Active Member of Clubs or Organizations:   . Attends Archivist Meetings:   Marland Kitchen Marital Status:   Intimate Partner Violence:   . Fear of Current or Ex-Partner:   . Emotionally Abused:   Marland Kitchen Physically Abused:   . Sexually Abused:      Review of Systems    General:  No chills, fever, night sweats or weight changes.  Cardiovascular:  No chest pain, dyspnea on exertion, edema, orthopnea, palpitations, paroxysmal nocturnal dyspnea. Dermatological: No rash, lesions/masses Respiratory: No cough, dyspnea Urologic: No hematuria, dysuria Abdominal:   No nausea, vomiting, diarrhea, bright red blood per rectum, melena, or hematemesis Neurologic:  No visual changes, wkns, changes in mental status. All other systems reviewed and are otherwise negative except as noted above.  Physical Exam    VS:  BP 134/76   Pulse 83   Ht 5\' 10"  (1.778 m)   Wt 231 lb 9.6 oz (105.1 kg)   SpO2 98%   BMI 33.23 kg/m  , BMI Body mass index  is 33.23 kg/m. GEN: Well nourished, well developed, in no acute distress. HEENT: normal. Neck: Supple, no JVD, carotid bruits, or masses. Cardiac: RRR, no murmurs, rubs, or gallops. No clubbing, cyanosis, +1 pitting bilateral lower extremity edema.  Radials/DP/PT 2+ and equal bilaterally.  Respiratory:  Respirations regular and unlabored, clear to auscultation bilaterally. GI: Soft, nontender, nondistended, BS + x 4. MS: no deformity or atrophy. Skin: warm and dry, no rash. Neuro:  Strength and sensation are intact. Psych: Normal affect.  Accessory Clinical Findings    Recent Labs: 05/25/2019: ALT 68; Hemoglobin 11.0; Magnesium 2.1; Platelets 90 06/17/2019: BUN 29; Creatinine, Ser 1.19; Potassium 4.5; Sodium 134   Recent Lipid Panel    Component Value Date/Time   CHOL 515 (H) 05/29/2018 1123   TRIG 5,975 (HH) 05/29/2018 1123   HDL 3 (L) 05/29/2018 1123   CHOLHDL 171.7 (H) 05/29/2018 1123   CHOLHDL 14.2 06/17/2011 0400   VLDL UNABLE TO CALCULATE IF TRIGLYCERIDE OVER 400 mg/dL 06/17/2011 0400   LDLCALC Comment 05/29/2018 1123   LDLDIRECT 6 05/29/2018 1123    ECG personally reviewed by me today-none today.   Echocardiogram 05/22/2019 IMPRESSIONS    1. Left ventricular ejection fraction, by estimation, is 45 to 50%. The  left ventricle has mildly decreased function. The left ventricle  demonstrates global hypokinesis. There is mild left ventricular  hypertrophy. Left ventricular diastolic parameters  are consistent with Grade I diastolic dysfunction (impaired relaxation).  2. Right ventricular systolic function is moderately reduced. The right  ventricular size is mildly enlarged. There  is moderately elevated  pulmonary artery systolic pressure. The estimated right ventricular  systolic pressure is 57.8 mmHg.  3. The mitral valve is normal in structure and function. Mild mitral  valve regurgitation. No evidence of mitral stenosis.  4. The aortic valve is tricuspid. Aortic  valve regurgitation is not  visualized. Mild aortic valve sclerosis is present, with no evidence of  aortic valve stenosis.  5. Aortic dilatation noted. There is mild dilatation of the aortic root  measuring 37 mm.  6. The inferior vena cava is dilated in size with <50% respiratory  variability, suggesting right atrial pressure of 15 mmHg.  Cardiac catheterization 12/17/2016  Prox RCA to Mid RCA lesion, 80 %stenosed.  Mid RCA lesion, 80 %stenosed.  Dist RCA lesion, 95 %stenosed.  Prox Cx lesion, 95 %stenosed.  Ost 1st Mrg to 1st Mrg lesion, 95 %stenosed.  Ost Cx to Prox Cx lesion, 70 %stenosed.  Mid Cx to Dist Cx lesion, 80 %stenosed.  Ost 1st Diag to 1st Diag lesion, 90 %stenosed.  Ost 2nd Diag to 2nd Diag lesion, 90 %stenosed.  Mid LAD to Dist LAD lesion, 90 %stenosed.  Dist LAD lesion, 100 %stenosed.  LIMA.  Left radial artery.  SVG.  Origin to Prox Graft lesion, 100 %stenosed.  SVG and is large and anatomically normal.  Mid Graft lesion, 80 %stenosed.  Prox Graft lesion, 80 %stenosed.  Post intervention, there is a 0% residual stenosis.  A stent was successfully placed.  Dist Graft to Insertion lesion, 95 %stenosed.  Post intervention, there is a 0% residual stenosis.  A stent was successfully placed.  The left ventricular systolic function is normal.  LV end diastolic pressure is normal.  The left ventricular ejection fraction is 50-55% by visual estimate.  Assessment & Plan   1.  Coronary artery disease-no chest discomfort today.  Has noticed periods of intermittent chest discomfort/pain with increased physical activity.  Relieved with rest.  Was taking Imdur as needed. Status post CABG x4 2014 (LIMA to LAD, left radial to OM, SVG to diagonal and PDA ) cardiac catheterization 2018 with patent LIMA-LAD stent, severe native disease, underwent DES x3 to SVG-RCA. Continue aspirin 81 mg daily Continue Plavix 75 mg daily Continue nitroglycerin as  needed Continue metoprolol succinate 75 mg daily Start Imdur 15 mg daily Heart healthy low-sodium diet Increase physical activity as tolerated-start walking 15 minutes daily.   Reduced ejection fraction/HFrEF -no increased work of breathing or increased fatigue.  Echocardiogram showed reduced ejection fraction at 45-50% from 60-65% on last echocardiogram. Continue metoprolol succinate Continue continue furosemide 40 mg tablet daily Heart healthy low-sodium diet Increase physical activity as tolerated Daily weights Lower extremity support stockings  PVCs/bigeminy-heart rate today 83.  Patient unaware Continue metoprolol succinate 75 mg daily Avoid triggers caffeine, chocolate, EtOH etc.  Hyperlipidemia-lipid clinic not a candidate for balance trial.  Total cholesterol 515, triglycerides 5975 Continue Crestor 20 mg at bedtime Continue fenofibrate Continue Vascepa Followed by Dr. Debara [name]  Essential hypertension-BP today  134/76.  Well-controlled at home Continue metoprolol succinate 75 mg daily Continue ramipril 5 mg daily Heart healthy low-sodium diet Increase physical activity as tolerated  Lower extremity edema-+1 pitting bilateral lower extremity edema.  Felt to be related to venous congestion.  Previously has not had much improvement with several rounds of antibiotics. Continue furosemide 40 mg daily Lower extremity support stockings-[place] sheet given elevate extremities when not active Heart healthy low-sodium diet  Disposition: Follow-up with me in 1 month and Dr. Gwenlyn [name] in  3 months.  Jossie Ng. Keivon Garden NP-C    10/06/2019, 8:38 AM [address] Office [phone] Fax [phone]

## 2019-10-06 ENCOUNTER — Telehealth: Payer: Self-pay | Admitting: General Practice

## 2019-10-06 ENCOUNTER — Ambulatory Visit (INDEPENDENT_AMBULATORY_CARE_PROVIDER_SITE_OTHER): Payer: Commercial Managed Care - PPO | Admitting: General Practice

## 2019-10-06 ENCOUNTER — Encounter: Payer: Self-pay | Admitting: General Practice

## 2019-10-06 ENCOUNTER — Other Ambulatory Visit: Payer: Self-pay

## 2019-10-06 VITALS — BP 134/76 | HR 83 | Ht 70.0 in | Wt 231.6 lb

## 2019-10-06 DIAGNOSIS — I10 Essential (primary) hypertension: Secondary | ICD-10-CM

## 2019-10-06 DIAGNOSIS — I5020 Unspecified systolic (congestive) heart failure: Secondary | ICD-10-CM | POA: Diagnosis not present

## 2019-10-06 DIAGNOSIS — E783 Hyperchylomicronemia: Secondary | ICD-10-CM | POA: Diagnosis not present

## 2019-10-06 DIAGNOSIS — R600 Localized edema: Secondary | ICD-10-CM

## 2019-10-06 DIAGNOSIS — I493 Ventricular premature depolarization: Secondary | ICD-10-CM

## 2019-10-06 DIAGNOSIS — I25119 Atherosclerotic heart disease of native coronary artery with unspecified angina pectoris: Secondary | ICD-10-CM | POA: Diagnosis not present

## 2019-10-06 NOTE — Telephone Encounter (Signed)
Juan [name] is calling stating they have unanswered questions that were asked during Juan [name]'s appointment with Denyse Amass today. They are wanting to know if Juan [name] has had a heart attack in the last year and Juan [name] states Juan [name] was not given a clear answer. Please advise.

## 2019-10-06 NOTE — Patient Instructions (Signed)
Medication Instructions:  START IMDUR 15MG  DAILY *If you need a refill on your cardiac medications before your next appointment, please call your pharmacy*  Special Instructions PLEASE PURCHASE AND WEAR COMPRESSION STOCKINGS DAILY AND OFF AT BEDTIME. Compression stockings are elastic socks that squeeze the legs. They help to increase blood flow to the legs and to decrease swelling in the legs from fluid retention, and reduce the chance of developing blood clots in the lower legs. Please put on in the AM when dressing and off at night when dressing for bed. PLEASE MAKE SURE TO ELEVATE YOU LEGS WHILE SITTING, THIS WILL HELP WITH THE SWELLING ALSO.  Follow-Up: Your next appointment:  1 month(s) Coletta Memos, FNP AND 3 MONTHS WITH DR Gwenlyn Found In Person   At [facility], you and your health needs are our priority.  As part of our continuing mission to provide you with exceptional heart care, we have created designated Provider Care Teams.  These Care Teams include your primary Cardiologist (physician) and Advanced Practice Providers (APPs -  Physician Assistants and Nurse Practitioners) who all work together to provide you with the care you need, when you need it.  We recommend signing up for the patient portal called "MyChart".  Sign up information is provided on this After Visit Summary.  MyChart is used to connect with patients for Virtual Visits (Telemedicine).  Patients are able to view lab/test results, encounter notes, upcoming appointments, etc.  Non-urgent messages can be sent to your provider as well.   To learn more about what you can do with MyChart, go to [email or web address].

## 2019-10-06 NOTE — Telephone Encounter (Signed)
Routed to [place] LPN to assist, as patient was seen today in office

## 2019-10-06 NOTE — Telephone Encounter (Signed)
Deberah Pelton, NP to Fidel Levy, RN   @ 10/06/19 10:38 AM Please contact Mr. Gurr and let him know that we do not believe he has had a heart attack in the last year. Has not had since CABG 2014 and CATH 2018. And no reason to believe he has had MI at all.   Returned call to wife she states that pt was not told if pt had a MI before. She states that she would like this question answered.  D/w Denyse Amass, he states that pt is deconditioned d/t no exercise. He will need to take his Imdur and this will help with CP and DOE. Also, increase physical activity 77min QD.  Dshawn Mcnay (wife) notified she states that pt will start taking Imdur and exercising on the Elliptical. We will see how it goes at that follow up.

## 2019-10-08 ENCOUNTER — Telehealth: Payer: Self-pay | Admitting: General Practice

## 2019-10-08 MED ORDER — ISOSORBIDE MONONITRATE ER 30 MG PO TB24: 15.0000 mg | ORAL_TABLET | Freq: Every day | ORAL | 3 refills | Status: DC

## 2019-10-08 NOTE — Telephone Encounter (Signed)
Pt c/o medication issue:  1. Name of Medication: isosorbide mononitrate (IMDUR) 30 MG 24 hr tablet  2. How are you currently taking this medication (dosage and times per day)? Half a tablet daily  3. Are you having a reaction (difficulty breathing--STAT)? no  4. What is your medication issue? Patient's wife calling stating the patient lost his previous prescription for the medication. She states the new prescription is for half a tablet daily instead of as needed and it has not been sent to the pharmacy yet. Since he lost the medication he is now out.

## 2019-10-08 NOTE — Telephone Encounter (Signed)
Spoke with pt wife, New script sent to the pharmacy

## 2019-10-09 ENCOUNTER — Telehealth: Payer: Self-pay | Admitting: Cardiovascular Disease

## 2019-10-09 NOTE — Telephone Encounter (Signed)
Spoke patient-- he states he is short of breath  -  No chest pain .  He states when takes a deep breath. No pain    He started 1/2 tablet Imdur(30 mg) today. He thought this may be the culprit. RN informed patient the reason why medication is given. He states he is at work. He does not have an  Oxygen saturation aailable to use. RN informed patient  He will need to be  evaluated at an urgent care or ER. Patient verbalized understanding.

## 2019-10-09 NOTE — Telephone Encounter (Signed)
Pt c/o Shortness Of Breath: STAT if SOB developed within the last 24 hours or pt is noticeably SOB on the phone  1. Are you currently SOB (can you hear that pt is SOB on the phone)? Patient states he is SOB right now  2. How long have you been experiencing SOB? Since 6:00 AM this morning, continuous  3. Are you SOB when sitting or when up moving around? Both  4. Are you currently experiencing any other symptoms? No

## 2019-11-02 ENCOUNTER — Telehealth: Payer: Self-pay | Admitting: Internal Medicine

## 2019-11-02 DIAGNOSIS — E783 Hyperchylomicronemia: Secondary | ICD-10-CM

## 2019-11-02 NOTE — Telephone Encounter (Signed)
Lipid panel ordered Left message with lab reminder, due prior to 11/12/19 visit with Dr. Debara Pickett

## 2019-11-03 NOTE — Progress Notes (Signed)
Cardiology Clinic Note   Patient Name: Juan [name] Date of Encounter: 11/06/2019  Primary Care Provider:  Patient, No Pcp Per Primary Cardiologist:  Juan Burow, MD  Patient Profile    Juan [name] 54 year old male presents for follow-up of his coronary artery disease.  Status post CABG x4 (LIMA-LAD, left radial to marginal branch, SVG-diagonal, SVG-PDA (02/03/2013), cardiac catheterization 10/18 with DES x1 to RCA vein graft.  Past Medical History    Past Medical History:  Diagnosis Date  . Abnormal cardiovascular stress test 02/06/2013  . Anemia    occasional - no problems currently(10/02/2011)  . Bilateral renal cysts 06/16/2011   states no known problems  . Blood transfusion without reported diagnosis   . CAD (coronary artery disease), LAD 90%, 1st diag 95%, LCX 70% wth AV groove 90%, RCA 40-50% mid and 80% long distal stenosis 02/03/13 02/04/2013  . Chest pain    positive Myoview stress test  . Coronary artery calcification seen on CAT scan   . Diabetes mellitus    IDDM  . Fatty liver disease, nonalcoholic 6/60/6301  . Hyperlipidemia   . Hypertension    states no dx. of HTN, takes med. to protect kidneys due to DM  . Lateral meniscus tear 09/2011   left  . Loose body in knee 09/2011   loose bodies left knee  . Non Hodgkin's lymphoma ([place]) 1991  . Pancreatitis    occasional - last episode 06/2011  . S/P CABG x 4, 02/05/13 LIMA-LAD; LT. RADIAL-OM;VG-DIAG; VG-PDA 02/06/2013   10/18 3/4 patent grafts (occluded SVG-->Diag), PCI/DESx 3 SVG-->RCA, normal EF  . Splenomegaly, congestive, chronic   . Stuffy and runny nose 10/02/2011   yellow drainage from nose   Past Surgical History:  Procedure Laterality Date  . ABDOMINAL AORTIC ANEURYSM REPAIR    . ANTERIOR CERVICAL DECOMP/DISCECTOMY FUSION Juan [name] 03/26/2018   Procedure: ANTERIOR CERVICAL DECOMPRESSION FUSION CERVICAL 5-6 WITH INSTRUMENTATION AND ALLOGRAFT;  Surgeon: Juan Bob, MD;  Location: [place];  Service:  Orthopedics;  Laterality: Juan [name];  . CORONARY ARTERY BYPASS GRAFT Juan [name] 02/05/2013   Procedure: CORONARY ARTERY BYPASS GRAFTING (CABG);  Surgeon: Juan Poot, MD;  Location: [place];  Service: Open Heart Surgery;  Laterality: Juan [name];  Coronary artery bypass graft on pump times four using left internal mammary artery and right greater saphenous vein via endovein harvest and left radial artery harvest.   . CORONARY STENT INTERVENTION  12/17/2016    PCI and drug-eluting stenting of the mid and distal RCA SVG   . CORONARY STENT INTERVENTION Juan [name] 12/17/2016   Procedure: CORONARY STENT INTERVENTION;  Surgeon: Juan Harp, MD;  Location: [facility];  Service: Cardiovascular;  Laterality: Juan [name];  . ELBOW SURGERY    . HERNIA REPAIR     inguinal   . herniated disc    . INTRAOPERATIVE TRANSESOPHAGEAL ECHOCARDIOGRAM Juan [name] 02/05/2013   Procedure: INTRAOPERATIVE TRANSESOPHAGEAL ECHOCARDIOGRAM;  Surgeon: Juan Poot, MD;  Location: [place];  Service: Open Heart Surgery;  Laterality: Juan [name];  . KNEE ARTHROSCOPY  10/09/2011   Procedure: ARTHROSCOPY KNEE;  Surgeon: Juan Sells, MD;  Location: [place];  Service: Orthopedics;  Laterality: Left;  . LEFT HEART CATH AND CORS/GRAFTS ANGIOGRAPHY Juan [name] 12/17/2016   Procedure: LEFT HEART CATH AND CORS/GRAFTS ANGIOGRAPHY;  Surgeon: Juan Harp, MD;  Location: [facility];  Service: Cardiovascular;  Laterality: Juan [name];  . LEFT HEART CATHETERIZATION WITH CORONARY ANGIOGRAM Juan [name] 02/03/2013   Procedure: LEFT HEART CATHETERIZATION WITH CORONARY  ANGIOGRAM;  Surgeon: Juan Harp, MD;  Location: [facility];  Service: Cardiovascular;  Laterality: Juan [name];  . NASAL SEPTUM SURGERY    . RADIAL ARTERY HARVEST Left 02/05/2013   Procedure: RADIAL ARTERY HARVEST;  Surgeon: Juan Poot, MD;  Location: [place];  Service: Vascular;  Laterality: Left;  . TIBIA BONE BIOPSY  x 3   left  . TONSILLECTOMY      Allergies  Allergies  Allergen  Reactions  . Reglan [Metoclopramide] Other (See Comments)    Only can tolerate in low doses, in higher doses it has the opposite effect    History of Present Illness    Mr. Juan [name] has a past medical history of hypertriglyceridemia, pancreatitis, coronary artery disease status post CABG x4 Dr. Darcey [name] (LIMA to LAD, left radial to OM, SVG to diagonal and PDA 11/14). Echocardiogram 05/22/2019 showed an LVEF of 45-50% with global hypokinesis, LVH, G1 DD. Moderately reduced RV function mildly enlarged RV, and moderately elevated pulmonary pressures. Right ventricular systolic pressure 35.5 mmHg. Mild aortic root dilation 37 mm. He underwent cardiac catheterization before chest pain and left upper extremity pain 10/18 which showed high-grade disease in his RCA SVG which was stented with DES x3.  His lipids are now managed by Dr. Debara [name]. History of triglyceride levels in the 6000 range. Currently on Vascepa.  He was  admitted to the hospital 05/21/2019-05/25/2019. He presented to the emergency department with complaints of right lower extremity swelling and erythema. He has a history of multiple episodes of lower extremity cellulitis. His troponins were initially elevated around 8000. He had no EKG changes. This was felt to be secondary to demand ischemia in the setting of sepsis with AKA, hypertension, and lactic acidosis with lower extremity cellulitis. He was treated with IV Zosyn and vancomycin which were transitioned to p.o. Augmentin. He was noted to have PVCs and bigeminy. He was unawareof PVCsand his beta-blocker was increased to 75 mg twice daily.   His elevated troponins were felt to be related to demand ischemia by Dr. Johnsie [name].  Dr. Gwenlyn [name] felt it may be possible that his vein graft may also be occluded.  He was last seen by Dr. Gwenlyn [name] on 07/08/2019.  During that time he denied chest pain and was noted to be minimally symptomatic.   He was seen by me on 06/01/2019 for follow-up of his  CAD and evaluation of his lower extremity edema.  I increased his furosemide to 80 mg x 3 days and then had him resume his normal dosing of 40 mg daily.   He was seen by Dr. Debara [name] on 08/10/2019 in the lipid clinic, and was selected as a candidate for the balance trial.  He has since been excluded from trial.  He presented the clinic 10/06/2019 and stated he has had 2 occasions of chest discomfort.  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  The second was while installing a over range microwave.  He stated that on both occasions his chest discomfort dissipated/went away with rest.  When asked about his Imdur medication he stated that he was taking it as needed for chest discomfort.  He was following a low-sodium diet and does not exercise regularly.  I had him increase his Imdur to 15 mg daily and begin walking 15 minutes/day outside of work.  I planned his follow-up in 1 month with me in 3 months with Dr. Gwenlyn [name].  I will also provide him with the  elastic support stocking [place] sheet.  He contacted the nurse triage line on 10/09/2019 and indicated that he was having increased shortness of breath.  He felt that this was related to the Imdur medication.  He presents to the clinic today for follow-up evaluation and states he has had occasional episodes of continued chest discomfort with increased activity.  He has seen a substantial decrease in his chest discomfort with the addition of Imdur 15.  On the first day of his daily dosing he did notice some changes with his breathing.  However since the first day he has not had any other symptoms.  I will increase his Imdur to 30 mg every other day and have him continue to take 15 mg on Tuesday Thursday and Saturday.  I have instructed him that he may take an extra dose of furosemide with a weight increase of 3 pounds overnight or 5 pounds in 1 week.  He does present today with a right shin wound that he states is a  result of an abrasion after a tripping incident.  He has been changing his dressing twice per day and it appears to be healing well.  I have recommended he continue to change his dressings keeping them clean dry and intact.  I will give him salty 6 diet sheet, have him continue to increase his physical activity as tolerated, and follow-up with Dr. Gwenlyn [name] as scheduled.  Today he denies chest pain, shortness of breath, increased lower extremity edema, fatigue,melena, hematuria, hemoptysis, diaphoresis, weakness, presyncope, syncope, orthopnea, and PND.  Home Medications    Prior to Admission medications   Medication Sig Start Date End Date Taking? Authorizing Provider  aspirin 81 MG chewable tablet Chew 1 tablet (81 mg total) by mouth daily. 12/19/16   Cheryln Manly, NP  clopidogrel (PLAVIX) 75 MG tablet TAKE 1 TABLET BY MOUTH DAILY WITH BREAKFAST. Patient taking differently: Take 75 mg by mouth daily.  05/18/19   Juan Harp, MD  fenofibrate 160 MG tablet Take 160 mg by mouth daily.     [provider]  ferrous sulfate 325 (65 FE) MG tablet Take 1 tablet (325 mg total) by mouth 2 (two) times daily with a meal. 05/25/19   Amin, Jeanella Flattery, MD  furosemide (LASIX) 40 MG tablet TAKE 1 TABLET BY MOUTH EVERY DAY Patient taking differently: Take 40 mg by mouth daily.  01/06/19   Juan Harp, MD  gabapentin (NEURONTIN) 300 MG capsule Take 300 mg by mouth at bedtime. 09/29/19   [provider]  Insulin Human (INSULIN PUMP) SOLN Inject into the skin.    [provider]  insulin regular human CONCENTRATED (HUMULIN R) 500 UNIT/ML SOLN injection Inject into the skin continuous. Using insulin pump    [provider]  isosorbide mononitrate (IMDUR) 30 MG 24 hr tablet Take 0.5 tablets (15 mg total) by mouth daily. 10/08/19 04/05/20  Deberah Pelton, NP  metFORMIN (GLUCOPHAGE) 1000 MG tablet Take 1,000 mg by mouth 2 (two) times daily with a meal.    [provider]  metoprolol succinate (TOPROL-XL) 25 MG 24 hr tablet Take 3 tablets (75 mg total) by mouth 2 (two) times daily. Take with or immediately following a meal. 06/18/19 06/12/20  Juan Harp, MD  nitroGLYCERIN (NITROSTAT) 0.4 MG SL tablet Place 1 tablet (0.4 mg total) under the tongue every 5 (five) minutes as needed. 06/18/19   Juan Harp, MD  Propylene Glycol (SYSTANE BALANCE) 0.6 % SOLN Place  1 drop into both eyes daily as needed (dry eyes).    [provider]  ramipril (ALTACE) 5 MG capsule Take 5 mg by mouth daily. 06/23/15   [provider]  rosuvastatin (CRESTOR) 20 MG tablet Take 1 tablet (20 mg total) by mouth daily at 6 PM. 06/18/19 06/12/20  Juan Harp, MD  VASCEPA 1 g CAPS TAKE 2 CAPSULES TWICE A DAY Patient taking differently: Take 2 g by mouth 2 (two) times daily.  09/16/18   Hilty, Nadean Corwin, MD    Family History    Family History  Adopted: Yes   is adopted.   Social History    Social History   Socioeconomic History  . Marital status: Married    Spouse name: [name]  . Number of children: 1  . Years of education: 8  . Highest education level: Not on file  Occupational History  . Occupation: Engineer, drilling     Employer: [employer]  Tobacco Use  . Smoking status: Never Smoker  . Smokeless tobacco: Never Used  Vaping Use  . Vaping Use: Never used  Substance and Sexual Activity  . Alcohol use: No  . Drug use: No  . Sexual activity: Yes  Other Topics Concern  . Not on file  Social History Narrative   Adopted, so no pertinent FH.  Married.  Lives with wife in [place].  Independent of ADLs and ambulation.   Social Determinants of Health   Financial Resource Strain:   . Difficulty of Paying Living Expenses: Not on file  Food Insecurity:   . Worried About Charity fundraiser in the Last Year: Not on file  . Ran Out of Food in the Last Year: Not on file  Transportation Needs:   . Lack of  Transportation (Medical): Not on file  . Lack of Transportation (Non-Medical): Not on file  Physical Activity:   . Days of Exercise per Week: Not on file  . Minutes of Exercise per Session: Not on file  Stress:   . Feeling of Stress : Not on file  Social Connections:   . Frequency of Communication with Friends and Family: Not on file  . Frequency of Social Gatherings with Friends and Family: Not on file  . Attends Religious Services: Not on file  . Active Member of Clubs or Organizations: Not on file  . Attends Archivist Meetings: Not on file  . Marital Status: Not on file  Intimate Partner Violence:   . Fear of Current or Ex-Partner: Not on file  . Emotionally Abused: Not on file  . Physically Abused: Not on file  . Sexually Abused: Not on file     Review of Systems    General:  No chills, fever, night sweats or weight changes.  Cardiovascular:  No chest pain, dyspnea on exertion, edema, orthopnea, palpitations, paroxysmal nocturnal dyspnea. Dermatological: No rash, lesions/masses Respiratory: No cough, dyspnea Urologic: No hematuria, dysuria Abdominal:   No nausea, vomiting, diarrhea, bright red blood per rectum, melena, or hematemesis Neurologic:  No visual changes, wkns, changes in mental status. All other systems reviewed and are otherwise negative except as noted above.  Physical Exam    VS:  BP 116/66   Pulse 86   Ht 5\' 10"  (1.778 m)   Wt 240 lb (108.9 kg)   SpO2 96%   BMI 34.44 kg/m  , BMI Body mass index is 34.44 kg/m. GEN: Well nourished, well developed, in no acute distress. HEENT: normal.  Neck: Supple, no JVD, carotid bruits, or masses. Cardiac: RRR, no murmurs, rubs, or gallops. No clubbing, cyanosis, bilateral lower extremity +1 pitting edema.  Radials/DP/PT 2+ and equal bilaterally.  Respiratory:  Respirations regular and unlabored, clear to auscultation bilaterally. GI: Soft, nontender, nondistended, BS + x 4. MS: no deformity or  atrophy. Skin: warm and dry, no rash.  Right shin superficial wound, mild erythema healing well. Neuro:  Strength and sensation are intact. Psych: Normal affect.  Accessory Clinical Findings    Recent Labs: 05/25/2019: ALT 68; Hemoglobin 11.0; Magnesium 2.1; Platelets 90 06/17/2019: BUN 29; Creatinine, Ser 1.19; Potassium 4.5; Sodium 134   Recent Lipid Panel    Component Value Date/Time   CHOL 515 (H) 05/29/2018 1123   TRIG 5,975 (HH) 05/29/2018 1123   HDL 3 (L) 05/29/2018 1123   CHOLHDL 171.7 (H) 05/29/2018 1123   CHOLHDL 14.2 06/17/2011 0400   VLDL UNABLE TO CALCULATE IF TRIGLYCERIDE OVER 400 mg/dL 06/17/2011 0400   LDLCALC Comment 05/29/2018 1123   LDLDIRECT 6 05/29/2018 1123    ECG personally reviewed by me today-none today.    Echocardiogram 05/22/2019 IMPRESSIONS    1. Left ventricular ejection fraction, by estimation, is 45 to 50%. The  left ventricle has mildly decreased function. The left ventricle  demonstrates global hypokinesis. There is mild left ventricular  hypertrophy. Left ventricular diastolic parameters  are consistent with Grade I diastolic dysfunction (impaired relaxation).  2. Right ventricular systolic function is moderately reduced. The right  ventricular size is mildly enlarged. There is moderately elevated  pulmonary artery systolic pressure. The estimated right ventricular  systolic pressure is 97.4 mmHg.  3. The mitral valve is normal in structure and function. Mild mitral  valve regurgitation. No evidence of mitral stenosis.  4. The aortic valve is tricuspid. Aortic valve regurgitation is not  visualized. Mild aortic valve sclerosis is present, with no evidence of  aortic valve stenosis.  5. Aortic dilatation noted. There is mild dilatation of the aortic root  measuring 37 mm.  6. The inferior vena cava is dilated in size with <50% respiratory  variability, suggesting right atrial pressure of 15 mmHg.  Cardiac catheterization  12/17/2016  Prox RCA to Mid RCA lesion, 80 %stenosed.  Mid RCA lesion, 80 %stenosed.  Dist RCA lesion, 95 %stenosed.  Prox Cx lesion, 95 %stenosed.  Ost 1st Mrg to 1st Mrg lesion, 95 %stenosed.  Ost Cx to Prox Cx lesion, 70 %stenosed.  Mid Cx to Dist Cx lesion, 80 %stenosed.  Ost 1st Diag to 1st Diag lesion, 90 %stenosed.  Ost 2nd Diag to 2nd Diag lesion, 90 %stenosed.  Mid LAD to Dist LAD lesion, 90 %stenosed.  Dist LAD lesion, 100 %stenosed.  LIMA.  Left radial artery.  SVG.  Origin to Prox Graft lesion, 100 %stenosed.  SVG and is large and anatomically normal.  Mid Graft lesion, 80 %stenosed.  Prox Graft lesion, 80 %stenosed.  Post intervention, there is a 0% residual stenosis.  A stent was successfully placed.  Dist Graft to Insertion lesion, 95 %stenosed.  Post intervention, there is a 0% residual stenosis.  A stent was successfully placed.  The left ventricular systolic function is normal.  LV end diastolic pressure is normal.  The left ventricular ejection fraction is 50-55% by visual estimate.  Assessment & Plan   1.  Coronary artery disease-no chest discomfort today.    Continues to noticed periods of intermittent chest discomfort/pain with increased physical activity.  Relieved with rest.  Status post CABG  [phone] (LIMA to LAD, left radial to OM, SVG to diagonal and PDA)cardiac catheterization 2018 with patent LIMA-LAD stent, severe native disease, underwent DES x3 to SVG-RCA. Continue aspirin81 mg daily Continue Plavix75 mg daily Continue nitroglycerin as needed Continue metoprolol succinate 75 mg daily Continue Imdur 15 mg Tuesday Thursday Saturday, 30 mg Monday Wednesday Friday Sunday Heart healthy low-sodium diet Increase physical activity as tolerated  Reduced ejection fraction/HFrEF-no increased work of breathing or increased fatigue. Echocardiogram showed reduced ejection fraction at 45-50% from 60-65% on last  echocardiogram. Continue metoprolol succinate Continue continue furosemide 40 mg tablet daily-May take an extra dose of furosemide with weight increase of 3 pounds overnight or 5 pounds in 1 week. Heart healthy low-sodium diet Increase physical activity as tolerated Daily weights Lower extremity support stockings  PVCs/bigeminy-heart rate [id number]. Patient unaware Continue metoprolol succinate 75 mg daily Avoid triggers caffeine, chocolate, EtOH etc.  Hyperlipidemia-lipid clinic not a candidate for balance trial.  Total cholesterol 515, triglycerides 5975 Continue Crestor 20 mg at bedtime Continue fenofibrate Continue Vascepa Followed by Dr. Debara [name]  Essential hypertension-BP today  116/66. Well-controlled at home Continue metoprolol succinate 75 mg daily Continue ramipril 5 mg daily Heart healthy low-sodium diet Increase physical activity as tolerated  Lower extremity edema-euvolemic today.  Previously has not had much improvement with several rounds of antibiotics. Continue furosemide 40 mg daily Continue lower extremity support stockings Heart healthy low-sodium diet  Disposition: Follow-up with me or Dr. Gwenlyn [name] in 3 months.   Jossie Ng. Faelynn Wynder NP-C    11/06/2019, 3:12 PM [facility] [place] [address] Office [phone] Fax [phone]  Notice: This dictation was prepared with Dragon dictation along with smaller phrase technology. Any transcriptional errors that result from this process are unintentional and may not be corrected upon review.

## 2019-11-04 NOTE — Research (Signed)
   Screening Run-In Clinic Visit     Date of Visit: 08/19/2019    Subject #: [id number]   During this visit the following activities were completed:  [x] Reading/Signing and Understanding the informed Consent   [x] Review Inclusion/Exclusion Criteria  [x] Vital Signs, Height, & Weight:  *Blood pressure: 130/81 (Subject sat supine for at least 5 minutes before this was performed) *Heart rate: 81 *Temperature: 97.6 *Respiratory Rate: 20 *Oxygen: 97% on room air *Weight: 228 lb 12.8 oz *Height: 5'10"  [x] Physical Exam done my PI or Sub-I  [x] Review Subjects Medical History & Concomitant Medications  [x] Review Any Adverse Events  [x] Review of any ER Visits, Hospitalizations and Inpatient Days  [x] 12-Lead ECG (Subject sat supine for at least 5 minutes before this was performed)  *All ECG's completed will be available in subjects binder   [x] Completed Blood Work per Protocol  [x] Genetic Testing Completed   [x] Extended Urinalysis/Pregnancy Test (if woman of childbearing age)  [x] Diet/Alcohol Counseling with Subject  [x] Y-Prime education/teaching with subject  [x] Y-Prime Forms to complete (2-Week FCS Symptom Recall, Pain Interference, and Daily Diary)  [x] Education/teaching subject on importance of completing the daily diary   [x] Education on the importance of complying with contraception precautions during study with subject agreement

## 2019-11-06 ENCOUNTER — Encounter: Payer: Self-pay | Admitting: General Practice

## 2019-11-06 ENCOUNTER — Ambulatory Visit (INDEPENDENT_AMBULATORY_CARE_PROVIDER_SITE_OTHER): Payer: Commercial Managed Care - PPO | Admitting: General Practice

## 2019-11-06 ENCOUNTER — Other Ambulatory Visit: Payer: Self-pay

## 2019-11-06 VITALS — BP 116/66 | HR 86 | Ht 70.0 in | Wt 240.0 lb

## 2019-11-06 DIAGNOSIS — I10 Essential (primary) hypertension: Secondary | ICD-10-CM

## 2019-11-06 DIAGNOSIS — I25119 Atherosclerotic heart disease of native coronary artery with unspecified angina pectoris: Secondary | ICD-10-CM | POA: Diagnosis not present

## 2019-11-06 DIAGNOSIS — E783 Hyperchylomicronemia: Secondary | ICD-10-CM | POA: Diagnosis not present

## 2019-11-06 DIAGNOSIS — I5020 Unspecified systolic (congestive) heart failure: Secondary | ICD-10-CM | POA: Diagnosis not present

## 2019-11-06 DIAGNOSIS — I493 Ventricular premature depolarization: Secondary | ICD-10-CM

## 2019-11-06 DIAGNOSIS — R600 Localized edema: Secondary | ICD-10-CM

## 2019-11-06 MED ORDER — ISOSORBIDE MONONITRATE ER 30 MG PO TB24: 30.0000 mg | ORAL_TABLET | Freq: Every day | ORAL | 3 refills | Status: DC

## 2019-11-06 NOTE — Patient Instructions (Signed)
Medication Instructions:  INCREASE ISOSORBIDE 30MG -MON, WED,FRI AND SUN  15MG -TUE,THU, SAT *If you need a refill on your cardiac medications before your next appointment, please call your pharmacy*  Special Instructions MAKE SURE TO CHANGE YOUR DRESSING TWICE DAILY  PLEASE READ AND FOLLOW SALTY 6-ATTACHED  Follow-Up: Your next appointment:  KEEP SCHEDULED APPOINTMENTS WITH DR HILTY AND DR BERRY.  In Person.  At [facility], you and your health needs are our priority.  As part of our continuing mission to provide you with exceptional heart care, we have created designated Provider Care Teams.  These Care Teams include your primary Cardiologist (physician) and Advanced Practice Providers (APPs -  Physician Assistants and Nurse Practitioners) who all work together to provide you with the care you need, when you need it.  We recommend signing up for the patient portal called "MyChart".  Sign up information is provided on this After Visit Summary.  MyChart is used to connect with patients for Virtual Visits (Telemedicine).  Patients are able to view lab/test results, encounter notes, upcoming appointments, etc.  Non-urgent messages can be sent to your provider as well.   To learn more about what you can do with MyChart, go to [email or web address].

## 2019-11-12 ENCOUNTER — Other Ambulatory Visit: Payer: Self-pay

## 2019-11-12 ENCOUNTER — Encounter: Payer: Self-pay | Admitting: Internal Medicine

## 2019-11-12 ENCOUNTER — Ambulatory Visit (INDEPENDENT_AMBULATORY_CARE_PROVIDER_SITE_OTHER): Payer: Commercial Managed Care - PPO | Admitting: Internal Medicine

## 2019-11-12 VITALS — BP 120/72 | HR 81 | Ht 70.0 in | Wt 234.0 lb

## 2019-11-12 DIAGNOSIS — E783 Hyperchylomicronemia: Secondary | ICD-10-CM | POA: Diagnosis not present

## 2019-11-12 DIAGNOSIS — E781 Pure hyperglyceridemia: Secondary | ICD-10-CM

## 2019-11-12 DIAGNOSIS — Z79899 Other long term (current) drug therapy: Secondary | ICD-10-CM

## 2019-11-12 MED ORDER — ISOSORBIDE MONONITRATE ER 30 MG PO TB24: 30.0000 mg | ORAL_TABLET | Freq: Every day | ORAL | 3 refills | Status: DC

## 2019-11-12 NOTE — Patient Instructions (Signed)
Medication Instructions:  Dr. Debara Pickett advised that you take isosorbide mononitrate 30mg  daily Continue all other current medications  *If you need a refill on your cardiac medications before your next appointment, please call your pharmacy*   Lab Work: FASTING lipid panel in 6 months at any [facility]  If you have labs (blood work) drawn today and your tests are completely normal, you will receive your results only by: Marland Kitchen MyChart Message (if you have MyChart) OR . A paper copy in the mail If you have any lab test that is abnormal or we need to change your treatment, we will call you to review the results.   Testing/Procedures: NONE   Follow-Up: At [facility], you and your health needs are our priority.  As part of our continuing mission to provide you with exceptional heart care, we have created designated Provider Care Teams.  These Care Teams include your primary Cardiologist (physician) and Advanced Practice Providers (APPs -  Physician Assistants and Nurse Practitioners) who all work together to provide you with the care you need, when you need it.  We recommend signing up for the patient portal called "MyChart".  Sign up information is provided on this After Visit Summary.  MyChart is used to connect with patients for Virtual Visits (Telemedicine).  Patients are able to view lab/test results, encounter notes, upcoming appointments, etc.  Non-urgent messages can be sent to your provider as well.   To learn more about what you can do with MyChart, go to [email or web address].    Your next appointment:   6 month(s) - lipid clinic  The format for your next appointment:   In Person or Virtual  Provider:   K. Mali Hilty, MD   Other Instructions

## 2019-11-12 NOTE — Progress Notes (Signed)
LIPID CLINIC CONSULT NOTE  Chief Complaint:  No complaints  Primary Care Physician: Patient, No Pcp Per  HPI:  Juan [name] is a 54 y.o. male who is being seen today for the evaluation of high triglycerides at the request of No ref. provider found.  Juan [name] is seen today for evaluation of elevated triglycerides.  He has been a patient of Juan [name] at [facility] for many years.  He was last seen there in 2018.  He has hyperchylomicronemia.  Triglycerides have been very high and most recently up to 4730.  He says at best his triglycerides have come down to 450, but this was after very strict low saturated fat and calorie diet.  He says he has not been able to maintain that.  He also has diabetes and his blood sugars have not been optimally controlled.  His most recent A1c in September 2019 was 9.6.  He sees Juan [name].  He has been on fenofibrate 160 mg.  In addition he takes low-dose simvastatin 10 mg.  He has a history of steatohepatitis and elevated liver enzymes in the past however most recently his ALT was 62 in September.  He also has clinical coronary disease having been found to have multivessel coronary disease in 2014 and subsequently he underwent coronary artery bypass grafting and has had multivessel PCI.  He was previously considered for niacin therapy however never really took that because of not being able to achieve good glycemic control.  He is not previously been on any omega-3's and had been considered for clinical trials but was not enrolled.  06/02/2018  Juan [name] returns today for follow-up of elevated triglycerides.  Fortunately, he has had a significant increase in his triglycerides since we last saw him.  His most recent labs showed total cholesterol of 515 with triglycerides of 5975.  HDL was 3 and LDL was 6 indicating persistent familial chylomicronemia syndrome.  He does say that his diet has significantly altered recently due to increased demands at work as well as  increased demands on his wife who works in a school [employer].  Although schools are now close due to the COVID-19 virus, she is now working extended hours to produce foods for children who are not at school.  He does report compliance with his medications.  12/29/2018  Juan [name] is seen today for follow-up.  Overall he seems to be doing well without any new chest pain or worsening shortness of breath.  Unfortunate his triglycerides remain elevated.  They have come down some in the mid 3600s.  His hemoglobin A1c is improved as well in the low sevens.  Unfortunately, were not able to get his triglycerides to be normalized at this point.  He is on a strict diet to lower saturated fats.  He is on maximal therapy at this point with fibrate and Vascepa.  We will need to investigate a possible clinical trial options for him.  LDL has been as low as 6.  08/10/2019  Juan [name] returns for follow-up.  Unfortunately spring was hospitalized with cellulitis/sepsis.  He was on an insulin drip and other things at that time and this may have contributed to a very low triglyceride number although previously he has had significantly elevated triglycerides and his generally never seen numbers below 800.  He continues on maximal standard therapy.  I had discussed with him previously about a clinical trial option for him and now that trial is available.  He is  a candidate for the balance trial and agreeable to participate.  Finally, he is noted to have persistent lower extremity edema.  He has seen Juan Memos, NP about this who recommended increasing his Lasix.  I would advise increasing it to twice daily for least a couple weeks and he should have close clinical follow-up.  He still has some redness although no warmth and I suspect venous congestion of the right lower extremity.  Several rounds of antibiotics have not improved it.  He did have a ruptured blister today which I dressed.  11/14/2019  Juan [name] is seen today  in follow-up.  He again recently had a wound to the right shin.  This is dressed today.  I looked at it and it seems to be slowly healing.  No evidence of any cellulitis I encouraged him to see the wound care clinic but he was going to try to see if he could doctor it home and follow-up with me if it was not improving.  We did screen him for the balance trial, but his FCS genetic screening was surprisingly negative.  Therefore he likely has severe multifactorial hypertriglyceridemia.  Most recently his triglycerides however have improved significantly down to 209.  He also had some recent chest discomfort.  He saw one of our NP's who had started isosorbide.  He has had some mild relief with this but occasionally gets some symptoms that persist.  PMHx:  Past Medical History:  Diagnosis Date  . Abnormal cardiovascular stress test 02/06/2013  . Anemia    occasional - no problems currently(10/02/2011)  . Bilateral renal cysts 06/16/2011   states no known problems  . Blood transfusion without reported diagnosis   . CAD (coronary artery disease), LAD 90%, 1st diag 95%, LCX 70% wth AV groove 90%, RCA 40-50% mid and 80% long distal stenosis 02/03/13 02/04/2013  . Chest pain    positive Myoview stress test  . Coronary artery calcification seen on CAT scan   . Diabetes mellitus    IDDM  . Fatty liver disease, nonalcoholic 1/60/1093  . Hyperlipidemia   . Hypertension    states no dx. of HTN, takes med. to protect kidneys due to DM  . Lateral meniscus tear 09/2011   left  . Loose body in knee 09/2011   loose bodies left knee  . Non Hodgkin's lymphoma ([place]) 1991  . Pancreatitis    occasional - last episode 06/2011  . S/P CABG x 4, 02/05/13 LIMA-LAD; LT. RADIAL-OM;VG-DIAG; VG-PDA 02/06/2013   10/18 3/4 patent grafts (occluded SVG-->Diag), PCI/DESx 3 SVG-->RCA, normal EF  . Splenomegaly, congestive, chronic   . Stuffy and runny nose 10/02/2011   yellow drainage from nose    Past Surgical History:   Procedure Laterality Date  . ABDOMINAL AORTIC ANEURYSM REPAIR    . ANTERIOR CERVICAL DECOMP/DISCECTOMY FUSION N/A 03/26/2018   Procedure: ANTERIOR CERVICAL DECOMPRESSION FUSION CERVICAL 5-6 WITH INSTRUMENTATION AND ALLOGRAFT;  Surgeon: Phylliss Bob, MD;  Location: [place];  Service: Orthopedics;  Laterality: N/A;  . CORONARY ARTERY BYPASS GRAFT N/A 02/05/2013   Procedure: CORONARY ARTERY BYPASS GRAFTING (CABG);  Surgeon: Ivin Poot, MD;  Location: [place];  Service: Open Heart Surgery;  Laterality: N/A;  Coronary artery bypass graft on pump times four using left internal mammary artery and right greater saphenous vein via endovein harvest and left radial artery harvest.   . CORONARY STENT INTERVENTION  12/17/2016    PCI and drug-eluting stenting of the mid and distal RCA SVG   .  CORONARY STENT INTERVENTION N/A 12/17/2016   Procedure: CORONARY STENT INTERVENTION;  Surgeon: Lorretta Harp, MD;  Location: [place] CV [facility];  Service: Cardiovascular;  Laterality: N/A;  . ELBOW SURGERY    . HERNIA REPAIR     inguinal   . herniated disc    . INTRAOPERATIVE TRANSESOPHAGEAL ECHOCARDIOGRAM N/A 02/05/2013   Procedure: INTRAOPERATIVE TRANSESOPHAGEAL ECHOCARDIOGRAM;  Surgeon: Ivin Poot, MD;  Location: [place];  Service: Open Heart Surgery;  Laterality: N/A;  . KNEE ARTHROSCOPY  10/09/2011   Procedure: ARTHROSCOPY KNEE;  Surgeon: Nita Sells, MD;  Location: [place];  Service: Orthopedics;  Laterality: Left;  . LEFT HEART CATH AND CORS/GRAFTS ANGIOGRAPHY N/A 12/17/2016   Procedure: LEFT HEART CATH AND CORS/GRAFTS ANGIOGRAPHY;  Surgeon: Lorretta Harp, MD;  Location: [name];  Service: Cardiovascular;  Laterality: N/A;  . LEFT HEART CATHETERIZATION WITH CORONARY ANGIOGRAM N/A 02/03/2013   Procedure: LEFT HEART CATHETERIZATION WITH CORONARY ANGIOGRAM;  Surgeon: Lorretta Harp, MD;  Location: [facility] CATH LAB;  Service: Cardiovascular;  Laterality: N/A;  . NASAL  SEPTUM SURGERY    . RADIAL ARTERY HARVEST Left 02/05/2013   Procedure: RADIAL ARTERY HARVEST;  Surgeon: Ivin Poot, MD;  Location: [place];  Service: Vascular;  Laterality: Left;  . TIBIA BONE BIOPSY  x 3   left  . TONSILLECTOMY      FAMHx:  Family History  Adopted: Yes    SOCHx:   reports that he has never smoked. He has never used smokeless tobacco. He reports that he does not drink alcohol and does not use drugs.  ALLERGIES:  Allergies  Allergen Reactions  . Reglan [Metoclopramide] Other (See Comments)    Only can tolerate in low doses, in higher doses it has the opposite effect    ROS: Pertinent items noted in HPI and remainder of comprehensive ROS otherwise negative.  HOME MEDS: Current Outpatient Medications on File Prior to Visit  Medication Sig Dispense Refill  . aspirin 81 MG chewable tablet Chew 1 tablet (81 mg total) by mouth daily.    . clopidogrel (PLAVIX) 75 MG tablet TAKE 1 TABLET BY MOUTH DAILY WITH BREAKFAST. 90 tablet 3  . fenofibrate 160 MG tablet Take 160 mg by mouth daily.     . furosemide (LASIX) 40 MG tablet TAKE 1 TABLET BY MOUTH EVERY DAY 90 tablet 3  . Insulin Human (INSULIN PUMP) SOLN Inject into the skin.    Marland Kitchen insulin regular human CONCENTRATED (HUMULIN R) 500 UNIT/ML SOLN injection Inject into the skin continuous. Using insulin pump    . isosorbide mononitrate (IMDUR) 30 MG 24 hr tablet Take 1 tablet (30 mg total) by mouth daily. 30MG  MON, WED, FRI, SUN 15MG -TUE,TH, SAT 24 tablet 3  . metFORMIN (GLUCOPHAGE) 1000 MG tablet Take 1,000 mg by mouth 2 (two) times daily with a meal.    . metoprolol succinate (TOPROL-XL) 25 MG 24 hr tablet Take 3 tablets (75 mg total) by mouth 2 (two) times daily. Take with or immediately following a meal. 540 tablet 3  . nitroGLYCERIN (NITROSTAT) 0.4 MG SL tablet Place 1 tablet (0.4 mg total) under the tongue every 5 (five) minutes as needed. 25 tablet 2  . Propylene Glycol (SYSTANE BALANCE) 0.6 % SOLN Place 1 drop  into both eyes daily as needed (dry eyes).    . ramipril (ALTACE) 5 MG capsule Take 5 mg by mouth daily.  4  . rosuvastatin (CRESTOR) 20 MG tablet Take 1 tablet (  20 mg total) by mouth daily at 6 PM. 90 tablet 3  . VASCEPA 1 g CAPS TAKE 2 CAPSULES TWICE A DAY 360 capsule 4   No current facility-administered medications on file prior to visit.    LABS/IMAGING: No results found for this or any previous visit (from the past 48 hour(s)). No results found.  LIPID PANEL:    Component Value Date/Time   CHOL 515 (H) 05/29/2018 1123   TRIG 5,975 (HH) 05/29/2018 1123   HDL 3 (L) 05/29/2018 1123   CHOLHDL 171.7 (H) 05/29/2018 1123   CHOLHDL 14.2 06/17/2011 0400   VLDL UNABLE TO CALCULATE IF TRIGLYCERIDE OVER 400 mg/dL 06/17/2011 0400   LDLCALC Comment 05/29/2018 1123   LDLDIRECT 6 05/29/2018 1123    WEIGHTS: Wt Readings from Last 3 Encounters:  11/12/19 234 lb (106.1 kg)  11/06/19 240 lb (108.9 kg)  10/06/19 231 lb 9.6 oz (105.1 kg)    VITALS: BP 120/72   Pulse 81   Ht 5\' 10"  (1.778 m)   Wt 234 lb (106.1 kg)   BMI 33.58 kg/m   EXAM: General appearance: alert and no distress Neck: no carotid bruit, no JVD and thyroid not enlarged, symmetric, no tenderness/mass/nodules Lungs: clear to auscultation bilaterally Heart: regular rate and rhythm, S1, S2 normal, no murmur, click, rub or gallop Abdomen: soft, non-tender; bowel sounds normal; no masses,  no organomegaly Extremities: edema 2+ bilateral, venous stasis dermatitis noted and ruptured blister on the RLE Pulses: 2+ and symmetric Skin: multiple xanthomas Neurologic: Grossly normal Psych: Pleasant  EKG: Deferred  ASSESSMENT: 1. Multifactorial severe hypertriglyceridemia 2. ASCVD with prior four-vessel CABG and PCI 3. Insulin-dependent diabetes 4. Hypertension  PLAN: 1.   Mr. Trotta was genetically negative for FCS (familial combined hyperlipidemia syndrome) and therefore likely has severe multifactorial  hypertriglyceridemia.  Unfortunately, this does not make him a candidate for the balance trial, however he would likely be a candidate for follow-up trial in patients that were genetically negative with high triglycerides.  Surprisingly though, on current therapies with dietary interventions, his most recent triglyceride level was 209.  We will continue to monitor this and continue current therapies.  With regard to intermittent chest discomfort.  Some relief was had with isosorbide.  I recommend increasing that instead of alternating doses up to 30 mg daily.  Should he have further wound healing issues, I have asked him to reach out to me and I am happy to refer him to the wound management clinic.  Follow-up with me in 6 months or sooner as necessary.  Pixie Casino, MD, [facility], [place] Director of the Advanced Lipid Disorders &  Cardiovascular Risk Reduction Clinic Diplomate of the American Board of Clinical Lipidology Attending Cardiologist  Direct Dial: [phone]  Fax: [phone]  Website:  [email or web address] 11/12/2019, 11:10 AM

## 2019-11-14 ENCOUNTER — Encounter: Payer: Self-pay | Admitting: Internal Medicine

## 2019-12-07 ENCOUNTER — Other Ambulatory Visit: Payer: Self-pay | Admitting: Internal Medicine

## 2020-01-06 ENCOUNTER — Encounter: Payer: Self-pay | Admitting: Cardiovascular Disease

## 2020-01-06 ENCOUNTER — Ambulatory Visit (INDEPENDENT_AMBULATORY_CARE_PROVIDER_SITE_OTHER): Payer: Commercial Managed Care - PPO | Admitting: Cardiovascular Disease

## 2020-01-06 ENCOUNTER — Other Ambulatory Visit: Payer: Self-pay

## 2020-01-06 VITALS — BP 138/68 | HR 92 | Ht 70.0 in | Wt 239.0 lb

## 2020-01-06 DIAGNOSIS — R0609 Other forms of dyspnea: Secondary | ICD-10-CM

## 2020-01-06 DIAGNOSIS — Z951 Presence of aortocoronary bypass graft: Secondary | ICD-10-CM

## 2020-01-06 DIAGNOSIS — I25119 Atherosclerotic heart disease of native coronary artery with unspecified angina pectoris: Secondary | ICD-10-CM | POA: Diagnosis not present

## 2020-01-06 DIAGNOSIS — I10 Essential (primary) hypertension: Secondary | ICD-10-CM

## 2020-01-06 DIAGNOSIS — R600 Localized edema: Secondary | ICD-10-CM

## 2020-01-06 DIAGNOSIS — E785 Hyperlipidemia, unspecified: Secondary | ICD-10-CM | POA: Diagnosis not present

## 2020-01-06 DIAGNOSIS — R0600 Dyspnea, unspecified: Secondary | ICD-10-CM

## 2020-01-06 MED ORDER — FUROSEMIDE 40 MG PO TABS: 40.0000 mg | ORAL_TABLET | Freq: Two times a day (BID) | ORAL | 0 refills | Status: DC

## 2020-01-06 NOTE — Assessment & Plan Note (Signed)
History of essential hypertension blood pressure measured today 138/68.  He is on Toprol and ramipril.

## 2020-01-06 NOTE — Assessment & Plan Note (Signed)
History of CAD status post CABG x4 by Dr. Dahlia Byes 11/14 with a LIMA to his LAD, left radial to the obtuse marginal branch, vein to a diagonal branch and to the PDA.  I performed cardiac catheterization on him because of chest pain 12/17/2016 revealing high-grade disease in his RCA SVG which I stented using several Synergy drug-eluting stents.  This resulted in excellent angiographic result and resolution of his anginal symptoms.  He did have a Myoview performed 06/26/2019 that showed inferior scar without ischemia.  When I saw him 6 months ago he was complaining of some chest pain which has since abated although his major complaints are now progressive dyspnea on exertion.  I am going to get a follow-up Lexiscan Myoview to rule out an ischemic etiology.

## 2020-01-06 NOTE — Assessment & Plan Note (Signed)
Bilateral lower extreme edema on furosemide 40 mg a day.  He has 2+ edema on exam with some cellulitic changes.  I am going to increase his furosemide to twice daily and will check a basic metabolic panel in 7 to 10 days.  He is 13 pounds heavier than he was when I saw him last.

## 2020-01-06 NOTE — Assessment & Plan Note (Signed)
Progressive dyspnea on exertion with EF by 2D echo performed back in April 1940 5 to 50%.  Again a recheck a 2D echo and double his furosemide.

## 2020-01-06 NOTE — Assessment & Plan Note (Signed)
History of dyslipidemia on fenofibrate Crestor and Vascepa with recent lipid profile performed 12/31/2019 revealing total cholesterol of 200, LDL 31, HDL of 19 and a triglyceride level of 2300 followed by Dr. Debara Pickett in the lipid clinic.

## 2020-01-06 NOTE — Patient Instructions (Signed)
Medication Instructions:  INCREASE the Furosemide to 40 mg twice daily  *If you need a refill on your cardiac medications before your next appointment, please call your pharmacy*   Lab Work: Your provider would like for you to return in 7-10 days to have the following labs drawn: BMET. You do not need an appointment for the lab. Once in our office lobby there is a podium where you can sign in and ring the doorbell to alert [place] that you are here. The lab is open from 8:00 am to 4:30 pm; closed for lunch from 12:45pm-1:45pm.  If you have labs (blood work) drawn today and your tests are completely normal, you will receive your results only by: Marland Kitchen MyChart Message (if you have MyChart) OR . A paper copy in the mail If you have any lab test that is abnormal or we need to change your treatment, we will call you to review the results.   Testing/Procedures: Your physician has requested that you have an echocardiogram. Echocardiography is a painless test that uses sound waves to create images of your heart. It provides your doctor with information about the size and shape of your heart and how well your heart's chambers and valves are working. You may receive an ultrasound enhancing agent through an IV if needed to better visualize your heart during the echo.This procedure takes approximately one hour. There are no restrictions for this procedure. This will take place at the [address], [address].   Your physician has requested that you have a lexiscan myoview. For further information please visit [email or web address]. Please follow instruction sheet, as given. This will take place at [place], [address]  How to prepare for your Myocardial Perfusion Test:  Do not eat or drink 3 hours prior to your test, except you may have water.  Do not consume products containing caffeine (regular or decaffeinated) 12 hours prior to your test. (ex: coffee, chocolate, sodas, tea).  Do bring a list of  your current medications with you.  If not listed below, you may take your medications as normal.  Do wear comfortable clothes (no dresses or overalls) and walking shoes, tennis shoes preferred (No heels or open toe shoes are allowed).  Do NOT wear cologne, perfume, aftershave, or lotions (deodorant is allowed).  The test will take approximately 3 to 4 hours to complete  If these instructions are not followed, your test will have to be rescheduled.  Follow-Up: At [facility], you and your health needs are our priority.  As part of our continuing mission to provide you with exceptional heart care, we have created designated Provider Care Teams.  These Care Teams include your primary Cardiologist (physician) and Advanced Practice Providers (APPs -  Physician Assistants and Nurse Practitioners) who all work together to provide you with the care you need, when you need it.  We recommend signing up for the patient portal called "MyChart".  Sign up information is provided on this After Visit Summary.  MyChart is used to connect with patients for Virtual Visits (Telemedicine).  Patients are able to view lab/test results, encounter notes, upcoming appointments, etc.  Non-urgent messages can be sent to your provider as well.   To learn more about what you can do with MyChart, go to [email or web address].    Your next appointment:   1 month(s)  The format for your next appointment:   In Person  Provider:   Quay Burow, MD

## 2020-01-06 NOTE — Progress Notes (Signed)
01/06/2020 LAREN WHALING   [birth year]  [id number]  Primary Physician Patient, No Pcp Per Primary Cardiologist: Lorretta Harp MD FACP, [place], [place], [place]  HPI:  Juan [name] is a 54 y.o.  mild to moderately overweight, married Caucasian male, father of 1 child who I last  saw in the office 07/08/2019. He has seen Dr. Sallyanne Kuster twice in 2011. He has a long history of insulin-dependent diabetes as well as extremely high hypertriglyceridemia in the 3000-6000 range with multiple episodes of pancreatitis. He had negative venous Dopplers for DVTs. He does wear compression stockings. He has complained of left inframammary chest pain and increasing shortness of breath. He is not aware of his family history since he was adopted. He had a CT scan of his chest and abdomen which showed a mass at the tail of his pancreas but also showed severe calcification in the LAD. He did have a Myoview stress test performed 2 years ago that was nonischemic. At that time, he had no symptoms of chest pain or shortness of breath. I referred him to Dr. Lavetta Nielsen at [facility] for more intense treatment of his severe hypertriglyceridemia.since I saw him last in December of last several weeks he developed exertional chest pain and shortness of breath.I was concerned that he has developed progression of disease given all of his risk factors.   He had a Myoview stress test performed that showed inferior ischemia which is high risk. He continued to have exertional chest pain with left upper irradiation.I performed cardiac catheterization on him 02/03/13 revealing three-vessel disease and preserved LV function. He subsequently underwent coronary artery bypass bypass grafting x4 by Dr. Tharon Aquas Trigt with a LIMA to his LAD, a left radial to obtuse marginal branch, vein graft to an diagonal branch and PDA. His postop course was uncomplicated. He completed 7 weeks her cardiac rehabilitation and is back to  work.  Since I saw him in the office 4 months ago he has seen Kerin Ransom back who ordered a 2-D echo that showed normal LV systolic function, grade 2 diastolic dysfunction and a Myoview stress test that was normal as well.  He was complaining of chest pain and left upper extremity pain. Based on this I performed outpatient cardiac catheterization on him 12/17/16 revealing high-grade disease in his RCA SVG which is stented using several synergy drug-eluting stents. This resulted in resolution of his anginal symptoms. He also has recurrent cellulitis of his right lower extremity on antibiotics with chronic lower extremity edema for which she uses compression stockings.   He was admitted with cellulitis and sepsis, acute renal insufficiency and demand ischemia with positive enzymes in the 4000 range.  2D echo during cephalization revealed EF of 45 to 50%.  A Myoview stress test performed 06/26/2019 revealed inferior scar without ischemia.  This was a new finding compared to his last Myoview in 2017.  It certainly possible that the vein graft which I stented 10/18 has occluded although he is minimally symptomatic from this.    Since I saw him 6 months ago he continues to see Dr. Debara Pickett for lipid management with triglycerides in the low 2000 range which is good for him.  The Myoview performed back in April did show inferior scar without ischemia with an EF of 45 to 50% by 2D echo.  Since that time he is gained 13 pounds and has 2+ pitting edema.  His chest pain has somewhat abated however his major  complaint is progressive dyspnea on exertion.   Current Meds  Medication Sig  . aspirin 81 MG chewable tablet Chew 1 tablet (81 mg total) by mouth daily.  . clopidogrel (PLAVIX) 75 MG tablet TAKE 1 TABLET BY MOUTH DAILY WITH BREAKFAST.  . fenofibrate 160 MG tablet Take 160 mg by mouth daily.   . furosemide (LASIX) 40 MG tablet Take 1 tablet (40 mg total) by mouth 2 (two) times daily.  . Insulin Human (INSULIN  PUMP) SOLN Inject into the skin.  Marland Kitchen insulin regular human CONCENTRATED (HUMULIN R) 500 UNIT/ML SOLN injection Inject into the skin continuous. Using insulin pump  . isosorbide mononitrate (IMDUR) 30 MG 24 hr tablet Take 1 tablet (30 mg total) by mouth daily.  . metFORMIN (GLUCOPHAGE) 1000 MG tablet Take 1,000 mg by mouth 2 (two) times daily with a meal.  . metoprolol succinate (TOPROL-XL) 25 MG 24 hr tablet Take 3 tablets (75 mg total) by mouth 2 (two) times daily. Take with or immediately following a meal.  . nitroGLYCERIN (NITROSTAT) 0.4 MG SL tablet Place 1 tablet (0.4 mg total) under the tongue every 5 (five) minutes as needed.  Marland Kitchen Propylene Glycol (SYSTANE BALANCE) 0.6 % SOLN Place 1 drop into both eyes daily as needed (dry eyes).  . ramipril (ALTACE) 5 MG capsule Take 5 mg by mouth daily.  . rosuvastatin (CRESTOR) 20 MG tablet Take 1 tablet (20 mg total) by mouth daily at 6 PM.  . VASCEPA 1 g capsule TAKE 2 CAPSULES BY MOUTH TWICE A DAY  . [DISCONTINUED] furosemide (LASIX) 40 MG tablet TAKE 1 TABLET BY MOUTH EVERY DAY     Allergies  Allergen Reactions  . Reglan [Metoclopramide] Other (See Comments)    Only can tolerate in low doses, in higher doses it has the opposite effect    Social History   Socioeconomic History  . Marital status: Married    Spouse name: [name]  . Number of children: 1  . Years of education: 83  . Highest education level: Not on file  Occupational History  . Occupation: Engineer, drilling     Employer: [employer]  Tobacco Use  . Smoking status: Never Smoker  . Smokeless tobacco: Never Used  Vaping Use  . Vaping Use: Never used  Substance and Sexual Activity  . Alcohol use: No  . Drug use: No  . Sexual activity: Yes  Other Topics Concern  . Not on file  Social History Narrative   Adopted, so no pertinent FH.  Married.  Lives with wife in [place].  Independent of ADLs and ambulation.   Social Determinants of Health   Financial  Resource Strain:   . Difficulty of Paying Living Expenses: Not on file  Food Insecurity:   . Worried About Charity fundraiser in the Last Year: Not on file  . Ran Out of Food in the Last Year: Not on file  Transportation Needs:   . Lack of Transportation (Medical): Not on file  . Lack of Transportation (Non-Medical): Not on file  Physical Activity:   . Days of Exercise per Week: Not on file  . Minutes of Exercise per Session: Not on file  Stress:   . Feeling of Stress : Not on file  Social Connections:   . Frequency of Communication with Friends and Family: Not on file  . Frequency of Social Gatherings with Friends and Family: Not on file  . Attends Religious Services: Not on file  . Active Member of  Clubs or Organizations: Not on file  . Attends Archivist Meetings: Not on file  . Marital Status: Not on file  Intimate Partner Violence:   . Fear of Current or Ex-Partner: Not on file  . Emotionally Abused: Not on file  . Physically Abused: Not on file  . Sexually Abused: Not on file     Review of Systems: General: negative for chills, fever, night sweats or weight changes.  Cardiovascular: negative for chest pain, dyspnea on exertion, edema, orthopnea, palpitations, paroxysmal nocturnal dyspnea or shortness of breath Dermatological: negative for rash Respiratory: negative for cough or wheezing Urologic: negative for hematuria Abdominal: negative for nausea, vomiting, diarrhea, bright red blood per rectum, melena, or hematemesis Neurologic: negative for visual changes, syncope, or dizziness All other systems reviewed and are otherwise negative except as noted above.    Blood pressure 138/68, pulse 92, height 5\' 10"  (1.778 m), weight 239 lb (108.4 kg), SpO2 96 %.  General appearance: alert and no distress Neck: no adenopathy, no carotid bruit, no JVD, supple, symmetrical, trachea midline and thyroid not enlarged, symmetric, no tenderness/mass/nodules Lungs: clear to  auscultation bilaterally Heart: regular rate and rhythm, S1, S2 normal, no murmur, click, rub or gallop Extremities: 2-3+ pitting edema Pulses: 2+ and symmetric Skin: Cellulitic changes in both pretibial areas Neurologic: Alert and oriented X 3, normal strength and tone. Normal symmetric reflexes. Normal coordination and gait  EKG not performed today  ASSESSMENT AND PLAN:   Dyslipidemia History of dyslipidemia on fenofibrate Crestor and Vascepa with recent lipid profile performed 12/31/2019 revealing total cholesterol of 200, LDL 31, HDL of 19 and a triglyceride level of 2300 followed by Dr. Debara Pickett in the lipid clinic.  S/P CABG x 4 History of CAD status post CABG x4 by Dr. Dahlia Byes 11/14 with a LIMA to his LAD, left radial to the obtuse marginal branch, vein to a diagonal branch and to the PDA.  I performed cardiac catheterization on him because of chest pain 12/17/2016 revealing high-grade disease in his RCA SVG which I stented using several Synergy drug-eluting stents.  This resulted in excellent angiographic result and resolution of his anginal symptoms.  He did have a Myoview performed 06/26/2019 that showed inferior scar without ischemia.  When I saw him 6 months ago he was complaining of some chest pain which has since abated although his major complaints are now progressive dyspnea on exertion.  I am going to get a follow-up Lexiscan Myoview to rule out an ischemic etiology.  Edema of both legs Bilateral lower extreme edema on furosemide 40 mg a day.  He has 2+ edema on exam with some cellulitic changes.  I am going to increase his furosemide to twice daily and will check a basic metabolic panel in 7 to 10 days.  He is 13 pounds heavier than he was when I saw him last.  Dyspnea on exertion Progressive dyspnea on exertion with EF by 2D echo performed back in April 1940 5 to 50%.  Again a recheck a 2D echo and double his furosemide.  HTN (hypertension) History of essential  hypertension blood pressure measured today 138/68.  He is on Toprol and ramipril.      Lorretta Harp MD FACP,FACC,FAHA, [facility] 01/06/2020 9:30 AM

## 2020-01-25 ENCOUNTER — Telehealth (HOSPITAL_COMMUNITY): Payer: Self-pay

## 2020-01-25 NOTE — Telephone Encounter (Signed)
Attempted to contact the patient, detialed instructions left on the patients answering machine. Asked to call back with any questions. S.Tran Randle EMTP

## 2020-01-29 ENCOUNTER — Other Ambulatory Visit (HOSPITAL_COMMUNITY): Payer: Commercial Managed Care - PPO

## 2020-01-29 ENCOUNTER — Ambulatory Visit (HOSPITAL_BASED_OUTPATIENT_CLINIC_OR_DEPARTMENT_OTHER): Payer: Commercial Managed Care - PPO

## 2020-01-29 ENCOUNTER — Other Ambulatory Visit: Payer: Self-pay

## 2020-01-29 ENCOUNTER — Ambulatory Visit (HOSPITAL_COMMUNITY): Payer: Commercial Managed Care - PPO | Attending: Cardiovascular Disease

## 2020-01-29 DIAGNOSIS — I25119 Atherosclerotic heart disease of native coronary artery with unspecified angina pectoris: Secondary | ICD-10-CM

## 2020-01-29 DIAGNOSIS — R079 Chest pain, unspecified: Secondary | ICD-10-CM | POA: Diagnosis present

## 2020-01-29 DIAGNOSIS — Z951 Presence of aortocoronary bypass graft: Secondary | ICD-10-CM | POA: Diagnosis present

## 2020-01-29 LAB — MYOCARDIAL PERFUSION IMAGING
LV dias vol: 163 mL (ref 62–150)
LV sys vol: 77 mL
Peak HR: 82 {beats}/min
Rest HR: 81 {beats}/min
SDS: 3
SRS: 13
SSS: 16
TID: 0.92

## 2020-01-29 LAB — ECHOCARDIOGRAM COMPLETE
Area-P 1/2: 5.27 cm2
S' Lateral: 3.5 cm

## 2020-01-29 MED ORDER — TECHNETIUM TC 99M TETROFOSMIN IV KIT
10.3000 | PACK | Freq: Once | INTRAVENOUS | Status: AC | PRN
  Administered 2020-01-29: 10.3 via INTRAVENOUS
  Filled 2020-01-29: qty 11

## 2020-01-29 MED ORDER — REGADENOSON 0.4 MG/5ML IV SOLN
0.4000 mg | Freq: Once | INTRAVENOUS | Status: AC
  Administered 2020-01-29: 0.4 mg via INTRAVENOUS

## 2020-01-29 MED ORDER — TECHNETIUM TC 99M TETROFOSMIN IV KIT
31.4000 | PACK | Freq: Once | INTRAVENOUS | Status: AC | PRN
  Administered 2020-01-29: 31.4 via INTRAVENOUS
  Filled 2020-01-29: qty 32

## 2020-01-30 ENCOUNTER — Ambulatory Visit: Payer: Commercial Managed Care - PPO | Attending: Internal Medicine

## 2020-01-30 DIAGNOSIS — Z23 Encounter for immunization: Secondary | ICD-10-CM

## 2020-01-30 NOTE — Progress Notes (Signed)
° °  Covid-19 Vaccination Clinic  Name:  RIYANSH GERSTNER    MRN: [id number] DOB: 1965/06/13  01/30/2020  Mr. Mcgroarty was observed post Covid-19 immunization for 15 minutes without incident. He was provided with Vaccine Information Sheet and instruction to access the V-Safe system.   Mr. Warriner was instructed to call 911 with any severe reactions post vaccine:  Difficulty breathing   Swelling of face and throat   A fast heartbeat   A bad rash all over body   Dizziness and weakness   Immunizations Administered    Name Date Dose VIS Date [place] COVID-19 Vaccine 01/30/2020  2:05 PM 0.3 mL 01/06/2020 Intramuscular   Manufacturer: [place]   Lot: [place]   [place]: [id number]

## 2020-02-09 ENCOUNTER — Encounter: Payer: Self-pay | Admitting: Cardiovascular Disease

## 2020-02-09 ENCOUNTER — Ambulatory Visit (INDEPENDENT_AMBULATORY_CARE_PROVIDER_SITE_OTHER): Payer: Commercial Managed Care - PPO | Admitting: Cardiovascular Disease

## 2020-02-09 ENCOUNTER — Other Ambulatory Visit: Payer: Self-pay

## 2020-02-09 VITALS — BP 122/64 | HR 82 | Ht 70.0 in | Wt 235.0 lb

## 2020-02-09 DIAGNOSIS — R0600 Dyspnea, unspecified: Secondary | ICD-10-CM

## 2020-02-09 DIAGNOSIS — I25119 Atherosclerotic heart disease of native coronary artery with unspecified angina pectoris: Secondary | ICD-10-CM

## 2020-02-09 DIAGNOSIS — Z951 Presence of aortocoronary bypass graft: Secondary | ICD-10-CM | POA: Diagnosis not present

## 2020-02-09 DIAGNOSIS — R0609 Other forms of dyspnea: Secondary | ICD-10-CM

## 2020-02-09 MED ORDER — SODIUM CHLORIDE 0.9% FLUSH: 3.0000 mL | Freq: Two times a day (BID) | INTRAVENOUS | Status: DC

## 2020-02-09 NOTE — Assessment & Plan Note (Signed)
Progressive dyspnea on exertion not significantly improved since I doubled his diuretics a month ago.  2D echo showed an EF of 45% with mild to moderate MR and a Myoview showed inferolateral scar without ischemia.  Based on his progressive symptoms I plan on performing right left heart cath to define his anatomy and physiology.

## 2020-02-09 NOTE — Addendum Note (Signed)
Addended by: Cristopher Estimable on: 02/09/2020 03:55 PM   Modules accepted: Orders, SmartSet

## 2020-02-09 NOTE — Assessment & Plan Note (Signed)
History of CAD status post coronary artery bypass grafting x4 by Dr. Dahlia Byes 02/03/2013.  He had a LIMA to his LAD, left radial to the obtuse marginal branch, vein to diagonal branch and a vein to the PDA.  I last cathed him on 12/17/2016 revealing an occluded vein to a diagonal branch, patent LIMA to the LAD, patent left radial to marginal branch and high-grade proximal mid and distal RCA SVG stenosis which I stented.  His symptoms resolved at that time.  He has had progressive shortness of breath.  He had a recent Myoview performed 01/29/2020 which showed inferolateral scar similar to his previous Myoview performed 06/26/2019.  The symptoms of progressive dyspnea.  I am concerned that this may be ischemically mediated.  I am going to perform right left heart cath on him.

## 2020-02-09 NOTE — Patient Instructions (Addendum)
    [place] [address] Dept: [phone] Loc: [place]  02/09/2020  GO TO [place], [place] ON Friday 02/12/20 @ 10:40 AM FOR COVID TESTING  You are scheduled for a Cardiac Catheterization on Monday, November 29 with Dr. Quay Burow.  1. Please arrive at the [facility] (Main Entrance A) at [facility]: [address] [place], [place] [address] at 9:00 AM (This time is two hours before your procedure to ensure your preparation). Free valet parking service is available.   Special note: Every effort is made to have your procedure done on time. Please understand that emergencies sometimes delay scheduled procedures.  2. Diet: Do not eat solid foods after midnight.  The patient may have clear liquids until 5am upon the day of the procedure.  3. Labs: You will need to have blood drawn on TODAY  4. Medication instructions in preparation for your procedure:  DO NOT TAKE METFORMIN Monday THE 29TH, Tuesday THE 30 TH OR Wednesday 12/1=RESTART ON Thursday 02/18/20  DO NOT TAKE FUROSEMIDE THE MORNING OF THE PROCEDURE  On the morning of your procedure, take your Aspirin and Plavix/Clopidogrel and any morning medicines NOT listed above.  You may use sips of water.  5. Plan for one night stay--bring personal belongings. 6. Bring a current list of your medications and current insurance cards. 7. You MUST have a responsible person to drive you home. 8. Someone MUST be with you the first 24 hours after you arrive home or your discharge will be delayed. 9. Please wear clothes that are easy to get on and off and wear slip-on shoes.  Thank you for allowing [place] to care for you!   -- [facility]   Your physician recommends that you schedule a follow-up appointment in: 2-3 [place]

## 2020-02-09 NOTE — H&P (View-Only) (Signed)
02/09/2020 Juan [name]   1965/09/28  [id number]  Primary Physician Patient, No Pcp Per Primary Cardiologist: Lorretta Harp MD FACP, [place], [place], [place]  HPI:  Juan [name] is a 54 y.o.  mild to moderately overweight, married Caucasian male, father of 1 child who I last saw in the office 01/06/2020.He has seen Dr. Sallyanne Kuster twice in 2011. He has a long history of insulin-dependent diabetes as well as extremely high hypertriglyceridemia in the 3000-6000 range with multiple episodes of pancreatitis. He had negative venous Dopplers for DVTs. He does wear compression stockings. He has complained of left inframammary chest pain and increasing shortness of breath. He is not aware of his family history since he was adopted. He had a CT scan of his chest and abdomen which showed a mass at the tail of his pancreas but also showed severe calcification in the LAD. He did have a Myoview stress test performed 2 years ago that was nonischemic. At that time, he had no symptoms of chest pain or shortness of breath. I referred him to Dr. Lavetta Nielsen at [facility] for more intense treatment of his severe hypertriglyceridemia.since I saw him last in December of last several weeks he developed exertional chest pain and shortness of breath.I was concerned that he has developed progression of disease given all of his risk factors.   He had a Myoview stress test performed that showed inferior ischemia which is high risk. He continued to have exertional chest pain with left upper irradiation.I performed cardiac catheterization on him 02/03/13 revealing three-vessel disease and preserved LV function. He subsequently underwent coronary artery bypass bypass grafting x4 by Dr. Tharon Aquas Trigt with a LIMA to his LAD, a left radial to obtuse marginal branch, vein graft to an diagonal branch and PDA. His postop course was uncomplicated. He completed 7 weeks her cardiac rehabilitation and is back to  work.  Since I saw him in the office 4 months ago he has seen Kerin Ransom back who ordered a 2-D echo that showed normal LV systolic function, grade 2 diastolic dysfunction and a Myoview stress test that was normal as well.  He was complaining of chest pain and left upper extremity pain. Based on this I performed outpatient cardiac catheterization on him 12/17/16 revealing high-grade disease in his RCA SVG which is stented using several synergy drug-eluting stents. This resulted in resolution of his anginal symptoms. He also has recurrent cellulitis of his right lower extremity on antibiotics with chronic lower extremity edema for which she uses compression stockings.   He was admitted with cellulitis and sepsis, acute renal insufficiency and demand ischemia with positive enzymes in the 4000 range. 2D echo during cephalization revealed EF of 45 to 50%. A Myoview stress test performed 06/26/2019 revealed inferior scar without ischemia. This was a new finding compared to his last Myoview in 2017. It certainly possible that the vein graft which I stented 10/18 has occluded although he is minimally symptomatic from this.   A  Myoview performed back in April did show inferior scar without ischemia with an EF of 45 to 50% by 2D echo.  Since that time he is gained 13 pounds and has 2+ pitting edema.  His chest pain has somewhat abated however his major complaint is progressive dyspnea on exertion.  Since I saw him a month ago I did double his furosemide however this has not significantly improved his dyspnea.  He still has 2+ pitting edema.  A 2D echo showed an EF of 45% with mild to moderate MR and a Myoview stress test showed inferolateral scar fairly similar to his Myoview performed 06/26/2019.  Given his progressive symptoms and the fact that his last cardiac cath was over 3 years ago I feel compelled to relook at his  anatomy and physiology.   Current Meds  Medication Sig  . aspirin 81 MG chewable  tablet Chew 1 tablet (81 mg total) by mouth daily.  . clopidogrel (PLAVIX) 75 MG tablet TAKE 1 TABLET BY MOUTH DAILY WITH BREAKFAST.  . fenofibrate 160 MG tablet Take 160 mg by mouth daily.   . furosemide (LASIX) 40 MG tablet Take 1 tablet (40 mg total) by mouth 2 (two) times daily.  . Insulin Human (INSULIN PUMP) SOLN Inject into the skin.  Marland Kitchen insulin regular human CONCENTRATED (HUMULIN R) 500 UNIT/ML SOLN injection Inject into the skin continuous. Using insulin pump  . isosorbide mononitrate (IMDUR) 30 MG 24 hr tablet Take 1 tablet (30 mg total) by mouth daily.  . metFORMIN (GLUCOPHAGE) 1000 MG tablet Take 1,000 mg by mouth 2 (two) times daily with a meal.  . metoprolol succinate (TOPROL-XL) 25 MG 24 hr tablet Take 3 tablets (75 mg total) by mouth 2 (two) times daily. Take with or immediately following a meal.  . nitroGLYCERIN (NITROSTAT) 0.4 MG SL tablet Place 1 tablet (0.4 mg total) under the tongue every 5 (five) minutes as needed.  Marland Kitchen Propylene Glycol (SYSTANE BALANCE) 0.6 % SOLN Place 1 drop into both eyes daily as needed (dry eyes).  . ramipril (ALTACE) 5 MG capsule Take 5 mg by mouth daily.  . rosuvastatin (CRESTOR) 20 MG tablet Take 1 tablet (20 mg total) by mouth daily at 6 PM.  . VASCEPA 1 g capsule TAKE 2 CAPSULES BY MOUTH TWICE A DAY     Allergies  Allergen Reactions  . Reglan [Metoclopramide] Other (See Comments)    Only can tolerate in low doses, in higher doses it has the opposite effect    Social History   Socioeconomic History  . Marital status: Married    Spouse name: [name]  . Number of children: 1  . Years of education: 48  . Highest education level: Not on file  Occupational History  . Occupation: Engineer, drilling     Employer: [employer]  Tobacco Use  . Smoking status: Never Smoker  . Smokeless tobacco: Never Used  Vaping Use  . Vaping Use: Never used  Substance and Sexual Activity  . Alcohol use: No  . Drug use: No  . Sexual activity:  Yes  Other Topics Concern  . Not on file  Social History Narrative   Adopted, so no pertinent FH.  Married.  Lives with wife in [place].  Independent of ADLs and ambulation.   Social Determinants of Health   Financial Resource Strain:   . Difficulty of Paying Living Expenses: Not on file  Food Insecurity:   . Worried About Charity fundraiser in the Last Year: Not on file  . Ran Out of Food in the Last Year: Not on file  Transportation Needs:   . Lack of Transportation (Medical): Not on file  . Lack of Transportation (Non-Medical): Not on file  Physical Activity:   . Days of Exercise per Week: Not on file  . Minutes of Exercise per Session: Not on file  Stress:   . Feeling of Stress : Not on file  Social Connections:   .  Frequency of Communication with Friends and Family: Not on file  . Frequency of Social Gatherings with Friends and Family: Not on file  . Attends Religious Services: Not on file  . Active Member of Clubs or Organizations: Not on file  . Attends Archivist Meetings: Not on file  . Marital Status: Not on file  Intimate Partner Violence:   . Fear of Current or Ex-Partner: Not on file  . Emotionally Abused: Not on file  . Physically Abused: Not on file  . Sexually Abused: Not on file     Review of Systems: General: negative for chills, fever, night sweats or weight changes.  Cardiovascular: negative for chest pain, dyspnea on exertion, edema, orthopnea, palpitations, paroxysmal nocturnal dyspnea or shortness of breath Dermatological: negative for rash Respiratory: negative for cough or wheezing Urologic: negative for hematuria Abdominal: negative for nausea, vomiting, diarrhea, bright red blood per rectum, melena, or hematemesis Neurologic: negative for visual changes, syncope, or dizziness All other systems reviewed and are otherwise negative except as noted above.    Blood pressure 122/64, pulse 72, height 5\' 10"  (1.778 m), weight 235 lb  (106.6 kg).  General appearance: alert and no distress Neck: no adenopathy, no carotid bruit, no JVD, supple, symmetrical, trachea midline and thyroid not enlarged, symmetric, no tenderness/mass/nodules Lungs: clear to auscultation bilaterally Heart: regular rate and rhythm, S1, S2 normal, no murmur, click, rub or gallop Extremities: 2+ pitting bilateral lower extremity edema Pulses: 2+ and symmetric Skin: Skin color, texture, turgor normal. No rashes or lesions Neurologic: Alert and oriented X 3, normal strength and tone. Normal symmetric reflexes. Normal coordination and gait  EKG sinus rhythm at 82 with right bundle branch block and left anterior fascicular block.  I personally reviewed this EKG.     ASSESSMENT AND PLAN:   S/P CABG x 4 History of CAD status post coronary artery bypass grafting x4 by Dr. Dahlia Byes 02/03/2013.  He had a LIMA to his LAD, left radial to the obtuse marginal branch, vein to diagonal branch and a vein to the PDA.  I last cathed him on 12/17/2016 revealing an occluded vein to a diagonal branch, patent LIMA to the LAD, patent left radial to marginal branch and high-grade proximal mid and distal RCA SVG stenosis which I stented.  His symptoms resolved at that time.  He has had progressive shortness of breath.  He had a recent Myoview performed 01/29/2020 which showed inferolateral scar similar to his previous Myoview performed 06/26/2019.  The symptoms of progressive dyspnea.  I am concerned that this may be ischemically mediated.  I am going to perform right left heart cath on him.  Dyspnea on exertion Progressive dyspnea on exertion not significantly improved since I doubled his diuretics a month ago.  2D echo showed an EF of 45% with mild to moderate MR and a Myoview showed inferolateral scar without ischemia.  Based on his progressive symptoms I plan on performing right left heart cath to define his anatomy and physiology.      Lorretta Harp MD  FACP,FACC,FAHA, [facility] 02/09/2020 3:32 PM

## 2020-02-09 NOTE — Progress Notes (Signed)
02/09/2020 BRIXON ZHEN   Aug 17, 1965  [id number]  Primary Physician Patient, No Pcp Per Primary Cardiologist: Lorretta Harp MD FACP, [place], [place], [place]  HPI:  Juan [name] is a 54 y.o.  mild to moderately overweight, married Caucasian male, father of 1 child who I last saw in the office 01/06/2020.He has seen Dr. Sallyanne Kuster twice in 2011. He has a long history of insulin-dependent diabetes as well as extremely high hypertriglyceridemia in the 3000-6000 range with multiple episodes of pancreatitis. He had negative venous Dopplers for DVTs. He does wear compression stockings. He has complained of left inframammary chest pain and increasing shortness of breath. He is not aware of his family history since he was adopted. He had a CT scan of his chest and abdomen which showed a mass at the tail of his pancreas but also showed severe calcification in the LAD. He did have a Myoview stress test performed 2 years ago that was nonischemic. At that time, he had no symptoms of chest pain or shortness of breath. I referred him to Dr. Lavetta Nielsen at [facility] for more intense treatment of his severe hypertriglyceridemia.since I saw him last in December of last several weeks he developed exertional chest pain and shortness of breath.I was concerned that he has developed progression of disease given all of his risk factors.   He had a Myoview stress test performed that showed inferior ischemia which is high risk. He continued to have exertional chest pain with left upper irradiation.I performed cardiac catheterization on him 02/03/13 revealing three-vessel disease and preserved LV function. He subsequently underwent coronary artery bypass bypass grafting x4 by Dr. Tharon Aquas Trigt with a LIMA to his LAD, a left radial to obtuse marginal branch, vein graft to an diagonal branch and PDA. His postop course was uncomplicated. He completed 7 weeks her cardiac rehabilitation and is back to  work.  Since I saw him in the office 4 months ago he has seen Kerin Ransom back who ordered a 2-D echo that showed normal LV systolic function, grade 2 diastolic dysfunction and a Myoview stress test that was normal as well.  He was complaining of chest pain and left upper extremity pain. Based on this I performed outpatient cardiac catheterization on him 12/17/16 revealing high-grade disease in his RCA SVG which is stented using several synergy drug-eluting stents. This resulted in resolution of his anginal symptoms. He also has recurrent cellulitis of his right lower extremity on antibiotics with chronic lower extremity edema for which she uses compression stockings.   He was admitted with cellulitis and sepsis, acute renal insufficiency and demand ischemia with positive enzymes in the 4000 range. 2D echo during cephalization revealed EF of 45 to 50%. A Myoview stress test performed 06/26/2019 revealed inferior scar without ischemia. This was a new finding compared to his last Myoview in 2017. It certainly possible that the vein graft which I stented 10/18 has occluded although he is minimally symptomatic from this.   A  Myoview performed back in April did show inferior scar without ischemia with an EF of 45 to 50% by 2D echo.  Since that time he is gained 13 pounds and has 2+ pitting edema.  His chest pain has somewhat abated however his major complaint is progressive dyspnea on exertion.  Since I saw him a month ago I did double his furosemide however this has not significantly improved his dyspnea.  He still has 2+ pitting edema.  A 2D echo showed an EF of 45% with mild to moderate MR and a Myoview stress test showed inferolateral scar fairly similar to his Myoview performed 06/26/2019.  Given his progressive symptoms and the fact that his last cardiac cath was over 3 years ago I feel compelled to relook at his  anatomy and physiology.   Current Meds  Medication Sig  . aspirin 81 MG chewable  tablet Chew 1 tablet (81 mg total) by mouth daily.  . clopidogrel (PLAVIX) 75 MG tablet TAKE 1 TABLET BY MOUTH DAILY WITH BREAKFAST.  . fenofibrate 160 MG tablet Take 160 mg by mouth daily.   . furosemide (LASIX) 40 MG tablet Take 1 tablet (40 mg total) by mouth 2 (two) times daily.  . Insulin Human (INSULIN PUMP) SOLN Inject into the skin.  Marland Kitchen insulin regular human CONCENTRATED (HUMULIN R) 500 UNIT/ML SOLN injection Inject into the skin continuous. Using insulin pump  . isosorbide mononitrate (IMDUR) 30 MG 24 hr tablet Take 1 tablet (30 mg total) by mouth daily.  . metFORMIN (GLUCOPHAGE) 1000 MG tablet Take 1,000 mg by mouth 2 (two) times daily with a meal.  . metoprolol succinate (TOPROL-XL) 25 MG 24 hr tablet Take 3 tablets (75 mg total) by mouth 2 (two) times daily. Take with or immediately following a meal.  . nitroGLYCERIN (NITROSTAT) 0.4 MG SL tablet Place 1 tablet (0.4 mg total) under the tongue every 5 (five) minutes as needed.  Marland Kitchen Propylene Glycol (SYSTANE BALANCE) 0.6 % SOLN Place 1 drop into both eyes daily as needed (dry eyes).  . ramipril (ALTACE) 5 MG capsule Take 5 mg by mouth daily.  . rosuvastatin (CRESTOR) 20 MG tablet Take 1 tablet (20 mg total) by mouth daily at 6 PM.  . VASCEPA 1 g capsule TAKE 2 CAPSULES BY MOUTH TWICE A DAY     Allergies  Allergen Reactions  . Reglan [Metoclopramide] Other (See Comments)    Only can tolerate in low doses, in higher doses it has the opposite effect    Social History   Socioeconomic History  . Marital status: Married    Spouse name: [name]  . Number of children: 1  . Years of education: 64  . Highest education level: Not on file  Occupational History  . Occupation: Engineer, drilling     Employer: [employer]  Tobacco Use  . Smoking status: Never Smoker  . Smokeless tobacco: Never Used  Vaping Use  . Vaping Use: Never used  Substance and Sexual Activity  . Alcohol use: No  . Drug use: No  . Sexual activity:  Yes  Other Topics Concern  . Not on file  Social History Narrative   Adopted, so no pertinent FH.  Married.  Lives with wife in [place].  Independent of ADLs and ambulation.   Social Determinants of Health   Financial Resource Strain:   . Difficulty of Paying Living Expenses: Not on file  Food Insecurity:   . Worried About Charity fundraiser in the Last Year: Not on file  . Ran Out of Food in the Last Year: Not on file  Transportation Needs:   . Lack of Transportation (Medical): Not on file  . Lack of Transportation (Non-Medical): Not on file  Physical Activity:   . Days of Exercise per Week: Not on file  . Minutes of Exercise per Session: Not on file  Stress:   . Feeling of Stress : Not on file  Social Connections:   .  Frequency of Communication with Friends and Family: Not on file  . Frequency of Social Gatherings with Friends and Family: Not on file  . Attends Religious Services: Not on file  . Active Member of Clubs or Organizations: Not on file  . Attends Archivist Meetings: Not on file  . Marital Status: Not on file  Intimate Partner Violence:   . Fear of Current or Ex-Partner: Not on file  . Emotionally Abused: Not on file  . Physically Abused: Not on file  . Sexually Abused: Not on file     Review of Systems: General: negative for chills, fever, night sweats or weight changes.  Cardiovascular: negative for chest pain, dyspnea on exertion, edema, orthopnea, palpitations, paroxysmal nocturnal dyspnea or shortness of breath Dermatological: negative for rash Respiratory: negative for cough or wheezing Urologic: negative for hematuria Abdominal: negative for nausea, vomiting, diarrhea, bright red blood per rectum, melena, or hematemesis Neurologic: negative for visual changes, syncope, or dizziness All other systems reviewed and are otherwise negative except as noted above.    Blood pressure 122/64, pulse 72, height 5\' 10"  (1.778 m), weight 235 lb  (106.6 kg).  General appearance: alert and no distress Neck: no adenopathy, no carotid bruit, no JVD, supple, symmetrical, trachea midline and thyroid not enlarged, symmetric, no tenderness/mass/nodules Lungs: clear to auscultation bilaterally Heart: regular rate and rhythm, S1, S2 normal, no murmur, click, rub or gallop Extremities: 2+ pitting bilateral lower extremity edema Pulses: 2+ and symmetric Skin: Skin color, texture, turgor normal. No rashes or lesions Neurologic: Alert and oriented X 3, normal strength and tone. Normal symmetric reflexes. Normal coordination and gait  EKG sinus rhythm at 82 with right bundle branch block and left anterior fascicular block.  I personally reviewed this EKG.     ASSESSMENT AND PLAN:   S/P CABG x 4 History of CAD status post coronary artery bypass grafting x4 by Dr. Dahlia Byes 02/03/2013.  He had a LIMA to his LAD, left radial to the obtuse marginal branch, vein to diagonal branch and a vein to the PDA.  I last cathed him on 12/17/2016 revealing an occluded vein to a diagonal branch, patent LIMA to the LAD, patent left radial to marginal branch and high-grade proximal mid and distal RCA SVG stenosis which I stented.  His symptoms resolved at that time.  He has had progressive shortness of breath.  He had a recent Myoview performed 01/29/2020 which showed inferolateral scar similar to his previous Myoview performed 06/26/2019.  The symptoms of progressive dyspnea.  I am concerned that this may be ischemically mediated.  I am going to perform right left heart cath on him.  Dyspnea on exertion Progressive dyspnea on exertion not significantly improved since I doubled his diuretics a month ago.  2D echo showed an EF of 45% with mild to moderate MR and a Myoview showed inferolateral scar without ischemia.  Based on his progressive symptoms I plan on performing right left heart cath to define his anatomy and physiology.      Lorretta Harp MD  FACP,FACC,FAHA, [facility] 02/09/2020 3:32 PM

## 2020-02-10 ENCOUNTER — Telehealth: Payer: Self-pay | Admitting: *Deleted

## 2020-02-10 LAB — CBC
Hematocrit: 41.2 % (ref 37.5–51.0)
Hemoglobin: 14.1 g/dL (ref 13.0–17.7)
MCH: 26.1 pg — ABNORMAL LOW (ref 26.6–33.0)
MCHC: 34.2 g/dL (ref 31.5–35.7)
MCV: 76 fL — ABNORMAL LOW (ref 79–97)
Platelets: 147 10*3/uL — ABNORMAL LOW (ref 150–450)
RBC: 5.4 x10E6/uL (ref 4.14–5.80)
RDW: 17.8 % — ABNORMAL HIGH (ref 11.6–15.4)
WBC: 8.3 10*3/uL (ref 3.4–10.8)

## 2020-02-10 LAB — BASIC METABOLIC PANEL
BUN/Creatinine Ratio: 39 — ABNORMAL HIGH (ref 9–20)
BUN: 60 mg/dL — ABNORMAL HIGH (ref 6–24)
CO2: 18 mmol/L — ABNORMAL LOW (ref 20–29)
Calcium: 10.6 mg/dL — ABNORMAL HIGH (ref 8.7–10.2)
Chloride: 94 mmol/L — ABNORMAL LOW (ref 96–106)
Creatinine, Ser: 1.52 mg/dL — ABNORMAL HIGH (ref 0.76–1.27)
GFR calc Af Amer: 60 mL/min/{1.73_m2} (ref >59)
GFR calc non Af Amer: 52 mL/min/{1.73_m2} — ABNORMAL LOW (ref >59)
Glucose: 79 mg/dL (ref 65–99)
Potassium: 4.4 mmol/L (ref 3.5–5.2)
Sodium: 133 mmol/L — ABNORMAL LOW (ref 134–144)

## 2020-02-10 NOTE — Telephone Encounter (Signed)
Pt contacted pre-catheterization scheduled at [facility] for: Monday February 15, 2020 11 AM Verified arrival time and place: [facility]) at: 9 AM   No solid food after midnight prior to cath, clear liquids until 5 AM day of procedure.  Hold: Lasix-hold PM dose prior to procedure and AM dose morning of procedure-GFR 52 Ramipril-hold day before and day of procedure-GFR 52 Insulin pump-pt will manage Metformin-day of procedure and 48 hours post procedure  Except hold medications AM meds can be  taken pre-cath with sips of water including: ASA 81 mg Plavix 75 mg  Confirmed patient has responsible adult to drive home post procedure and be with patient first 24 hours after arriving home: yes  You are allowed ONE visitor in the waiting room during the time you are at the hospital for your procedure. Both you and your visitor must wear a mask once you enter the hospital.       COVID-19 Pre-Screening Questions:  . In the past 14 days have you had any symptoms concerning for COVID-19 infection (fever, chills, cough, or new shortness of breath)? no . In the past 14 days have you been around anyone with known Covid 19? no   Reviewed procedure/mask/visitor instructions, COVID-19 questions with patient.

## 2020-02-12 ENCOUNTER — Other Ambulatory Visit (HOSPITAL_COMMUNITY)
Admission: RE | Admit: 2020-02-12 | Discharge: 2020-02-12 | Disposition: A | Discharge status: Home / Self Care | Payer: Commercial Managed Care - PPO | Source: Ambulatory Visit | Attending: Cardiovascular Disease | Admitting: Cardiovascular Disease

## 2020-02-12 DIAGNOSIS — Z20822 Contact with and (suspected) exposure to covid-19: Secondary | ICD-10-CM | POA: Insufficient documentation

## 2020-02-12 DIAGNOSIS — Z01812 Encounter for preprocedural laboratory examination: Secondary | ICD-10-CM | POA: Insufficient documentation

## 2020-02-12 LAB — SARS CORONAVIRUS 2 (TAT 6-24 HRS): SARS Coronavirus 2: NEGATIVE

## 2020-02-15 ENCOUNTER — Inpatient Hospital Stay (HOSPITAL_COMMUNITY)
Admission: RE | Admit: 2020-02-15 | Discharge: 2020-02-18 | DRG: 286 | Disposition: A | Discharge status: Home / Self Care | Payer: Commercial Managed Care - PPO | Attending: Internal Medicine | Admitting: Internal Medicine

## 2020-02-15 ENCOUNTER — Encounter (HOSPITAL_COMMUNITY)
Admission: RE | Disposition: A | Discharge status: Home / Self Care | Payer: Self-pay | Source: Home / Self Care | Attending: Cardiovascular Disease

## 2020-02-15 ENCOUNTER — Other Ambulatory Visit: Payer: Self-pay

## 2020-02-15 DIAGNOSIS — Z20822 Contact with and (suspected) exposure to covid-19: Secondary | ICD-10-CM | POA: Diagnosis present

## 2020-02-15 DIAGNOSIS — Z7982 Long term (current) use of aspirin: Secondary | ICD-10-CM

## 2020-02-15 DIAGNOSIS — I340 Nonrheumatic mitral (valve) insufficiency: Secondary | ICD-10-CM | POA: Diagnosis present

## 2020-02-15 DIAGNOSIS — Z955 Presence of coronary angioplasty implant and graft: Secondary | ICD-10-CM | POA: Diagnosis not present

## 2020-02-15 DIAGNOSIS — I493 Ventricular premature depolarization: Secondary | ICD-10-CM | POA: Diagnosis present

## 2020-02-15 DIAGNOSIS — Z951 Presence of aortocoronary bypass graft: Secondary | ICD-10-CM

## 2020-02-15 DIAGNOSIS — Z888 Allergy status to other drugs, medicaments and biological substances status: Secondary | ICD-10-CM

## 2020-02-15 DIAGNOSIS — Z794 Long term (current) use of insulin: Secondary | ICD-10-CM | POA: Diagnosis not present

## 2020-02-15 DIAGNOSIS — I25810 Atherosclerosis of coronary artery bypass graft(s) without angina pectoris: Secondary | ICD-10-CM | POA: Diagnosis present

## 2020-02-15 DIAGNOSIS — I25119 Atherosclerotic heart disease of native coronary artery with unspecified angina pectoris: Secondary | ICD-10-CM

## 2020-02-15 DIAGNOSIS — R0609 Other forms of dyspnea: Secondary | ICD-10-CM

## 2020-02-15 DIAGNOSIS — E781 Pure hyperglyceridemia: Secondary | ICD-10-CM | POA: Diagnosis present

## 2020-02-15 DIAGNOSIS — Z79899 Other long term (current) drug therapy: Secondary | ICD-10-CM

## 2020-02-15 DIAGNOSIS — Z9641 Presence of insulin pump (external) (internal): Secondary | ICD-10-CM | POA: Diagnosis present

## 2020-02-15 DIAGNOSIS — R0600 Dyspnea, unspecified: Secondary | ICD-10-CM

## 2020-02-15 DIAGNOSIS — I2510 Atherosclerotic heart disease of native coronary artery without angina pectoris: Secondary | ICD-10-CM | POA: Diagnosis present

## 2020-02-15 DIAGNOSIS — I5023 Acute on chronic systolic (congestive) heart failure: Secondary | ICD-10-CM | POA: Diagnosis present

## 2020-02-15 DIAGNOSIS — E119 Type 2 diabetes mellitus without complications: Secondary | ICD-10-CM | POA: Diagnosis present

## 2020-02-15 DIAGNOSIS — I452 Bifascicular block: Secondary | ICD-10-CM | POA: Diagnosis present

## 2020-02-15 DIAGNOSIS — I5043 Acute on chronic combined systolic (congestive) and diastolic (congestive) heart failure: Secondary | ICD-10-CM | POA: Diagnosis not present

## 2020-02-15 DIAGNOSIS — I110 Hypertensive heart disease with heart failure: Principal | ICD-10-CM | POA: Diagnosis present

## 2020-02-15 DIAGNOSIS — I5042 Chronic combined systolic (congestive) and diastolic (congestive) heart failure: Secondary | ICD-10-CM | POA: Diagnosis present

## 2020-02-15 HISTORY — PX: RIGHT/LEFT HEART CATH AND CORONARY/GRAFT ANGIOGRAPHY: CATH118267

## 2020-02-15 LAB — GLUCOSE, CAPILLARY
Glucose-Capillary: 180 mg/dL — ABNORMAL HIGH (ref 70–99)
Glucose-Capillary: 114 mg/dL — ABNORMAL HIGH (ref 70–99)
Glucose-Capillary: 103 mg/dL — ABNORMAL HIGH (ref 70–99)
Glucose-Capillary: 154 mg/dL — ABNORMAL HIGH (ref 70–99)

## 2020-02-15 LAB — POCT I-STAT 7, (LYTES, BLD GAS, ICA,H+H)
Acid-Base Excess: 1 mmol/L (ref 0.0–2.0)
Bicarbonate: 26.3 mmol/L (ref 20.0–28.0)
Calcium, Ion: 1.45 mmol/L — ABNORMAL HIGH (ref 1.15–1.40)
HCT: 36 % — ABNORMAL LOW (ref 39.0–52.0)
Hemoglobin: 12.2 g/dL — ABNORMAL LOW (ref 13.0–17.0)
O2 Saturation: 99 %
Potassium: 4.1 mmol/L (ref 3.5–5.1)
Sodium: 138 mmol/L (ref 135–145)
TCO2: 28 mmol/L (ref 22–32)
pCO2 arterial: 45.3 mmHg (ref 32.0–48.0)
pH, Arterial: 7.371 (ref 7.350–7.450)
pO2, Arterial: 136 mmHg — ABNORMAL HIGH (ref 83.0–108.0)

## 2020-02-15 LAB — POCT I-STAT EG7
Acid-Base Excess: 1 mmol/L (ref 0.0–2.0)
Acid-Base Excess: 3 mmol/L — ABNORMAL HIGH (ref 0.0–2.0)
Bicarbonate: 27.4 mmol/L (ref 20.0–28.0)
Bicarbonate: 29 mmol/L — ABNORMAL HIGH (ref 20.0–28.0)
Calcium, Ion: 1.49 mmol/L — ABNORMAL HIGH (ref 1.15–1.40)
Calcium, Ion: 1.5 mmol/L — ABNORMAL HIGH (ref 1.15–1.40)
HCT: 36 % — ABNORMAL LOW (ref 39.0–52.0)
HCT: 36 % — ABNORMAL LOW (ref 39.0–52.0)
Hemoglobin: 12.2 g/dL — ABNORMAL LOW (ref 13.0–17.0)
Hemoglobin: 12.2 g/dL — ABNORMAL LOW (ref 13.0–17.0)
O2 Saturation: 56 %
O2 Saturation: 58 %
Potassium: 4.2 mmol/L (ref 3.5–5.1)
Potassium: 4.3 mmol/L (ref 3.5–5.1)
Sodium: 137 mmol/L (ref 135–145)
Sodium: 138 mmol/L (ref 135–145)
TCO2: 29 mmol/L (ref 22–32)
TCO2: 31 mmol/L (ref 22–32)
pCO2, Ven: 51 mmHg (ref 44.0–60.0)
pCO2, Ven: 52.1 mmHg (ref 44.0–60.0)
pH, Ven: 7.339 (ref 7.250–7.430)
pH, Ven: 7.354 (ref 7.250–7.430)
pO2, Ven: 32 mmHg (ref 32.0–45.0)
pO2, Ven: 33 mmHg (ref 32.0–45.0)

## 2020-02-15 SURGERY — RIGHT/LEFT HEART CATH AND CORONARY/GRAFT ANGIOGRAPHY
Anesthesia: LOCAL

## 2020-02-15 MED ORDER — SODIUM CHLORIDE 0.9 % WEIGHT BASED INFUSION
3.0000 mL/kg/h | INTRAVENOUS | Status: DC
  Administered 2020-02-15: 3 mL/kg/h via INTRAVENOUS

## 2020-02-15 MED ORDER — SODIUM CHLORIDE 0.9 % IV SOLN: INTRAVENOUS | Status: AC

## 2020-02-15 MED ORDER — SODIUM CHLORIDE 0.9 % WEIGHT BASED INFUSION: 1.0000 mL/kg/h | INTRAVENOUS | Status: DC

## 2020-02-15 MED ORDER — IOHEXOL 350 MG/ML SOLN
INTRAVENOUS | Status: AC
  Filled 2020-02-15: qty 1

## 2020-02-15 MED ORDER — FUROSEMIDE 10 MG/ML IJ SOLN
INTRAMUSCULAR | Status: AC
  Filled 2020-02-15: qty 4

## 2020-02-15 MED ORDER — ISOSORBIDE MONONITRATE ER 30 MG PO TB24
30.0000 mg | ORAL_TABLET | Freq: Every day | ORAL | Status: DC
  Administered 2020-02-16 – 2020-02-18 (×3): 30 mg via ORAL
  Filled 2020-02-15 (×3): qty 1

## 2020-02-15 MED ORDER — FENOFIBRATE 160 MG PO TABS
160.0000 mg | ORAL_TABLET | Freq: Every day | ORAL | Status: DC
  Administered 2020-02-16 – 2020-02-18 (×3): 160 mg via ORAL
  Filled 2020-02-15 (×3): qty 1

## 2020-02-15 MED ORDER — LIDOCAINE HCL (PF) 1 % IJ SOLN
INTRAMUSCULAR | Status: AC
  Filled 2020-02-15: qty 30

## 2020-02-15 MED ORDER — MIDAZOLAM HCL 2 MG/2ML IJ SOLN
INTRAMUSCULAR | Status: AC
  Filled 2020-02-15: qty 2

## 2020-02-15 MED ORDER — ICOSAPENT ETHYL 1 G PO CAPS
2.0000 g | ORAL_CAPSULE | Freq: Two times a day (BID) | ORAL | Status: DC
  Administered 2020-02-15 – 2020-02-18 (×6): 2 g via ORAL
  Filled 2020-02-15 (×7): qty 2

## 2020-02-15 MED ORDER — ONDANSETRON HCL 4 MG/2ML IJ SOLN: 4.0000 mg | Freq: Four times a day (QID) | INTRAMUSCULAR | Status: DC | PRN

## 2020-02-15 MED ORDER — ASPIRIN 81 MG PO CHEW
81.0000 mg | CHEWABLE_TABLET | Freq: Every day | ORAL | Status: DC
  Administered 2020-02-16 – 2020-02-18 (×3): 81 mg via ORAL
  Filled 2020-02-15 (×3): qty 1

## 2020-02-15 MED ORDER — RAMIPRIL 2.5 MG PO CAPS
5.0000 mg | ORAL_CAPSULE | Freq: Every day | ORAL | Status: DC
  Administered 2020-02-16: 5 mg via ORAL
  Filled 2020-02-15: qty 2

## 2020-02-15 MED ORDER — ROSUVASTATIN CALCIUM 20 MG PO TABS: 20.0000 mg | ORAL_TABLET | Freq: Every day | ORAL | Status: DC

## 2020-02-15 MED ORDER — HYDRALAZINE HCL 20 MG/ML IJ SOLN: 10.0000 mg | INTRAMUSCULAR | Status: AC | PRN

## 2020-02-15 MED ORDER — METOPROLOL SUCCINATE ER 50 MG PO TB24
75.0000 mg | ORAL_TABLET | Freq: Two times a day (BID) | ORAL | Status: DC
  Administered 2020-02-15 – 2020-02-18 (×6): 75 mg via ORAL
  Filled 2020-02-15 (×6): qty 1

## 2020-02-15 MED ORDER — SODIUM CHLORIDE 0.9 % IV SOLN: 250.0000 mL | INTRAVENOUS | Status: DC | PRN

## 2020-02-15 MED ORDER — POLYVINYL ALCOHOL 1.4 % OP SOLN
1.0000 [drp] | OPHTHALMIC | Status: DC | PRN
  Filled 2020-02-15: qty 15

## 2020-02-15 MED ORDER — CLOPIDOGREL BISULFATE 75 MG PO TABS: 75.0000 mg | ORAL_TABLET | Freq: Every day | ORAL | Status: DC

## 2020-02-15 MED ORDER — ASPIRIN 81 MG PO CHEW: 81.0000 mg | CHEWABLE_TABLET | ORAL | Status: DC

## 2020-02-15 MED ORDER — ACETAMINOPHEN 325 MG PO TABS: 650.0000 mg | ORAL_TABLET | ORAL | Status: DC | PRN

## 2020-02-15 MED ORDER — FUROSEMIDE 10 MG/ML IJ SOLN
80.0000 mg | Freq: Two times a day (BID) | INTRAMUSCULAR | Status: DC
  Administered 2020-02-15 – 2020-02-18 (×6): 80 mg via INTRAVENOUS
  Filled 2020-02-15 (×6): qty 8

## 2020-02-15 MED ORDER — SODIUM CHLORIDE 0.9% FLUSH: 3.0000 mL | INTRAVENOUS | Status: DC | PRN

## 2020-02-15 MED ORDER — LABETALOL HCL 5 MG/ML IV SOLN: 10.0000 mg | INTRAVENOUS | Status: AC | PRN

## 2020-02-15 MED ORDER — FENTANYL CITRATE (PF) 100 MCG/2ML IJ SOLN
INTRAMUSCULAR | Status: DC | PRN
  Administered 2020-02-15: 25 ug via INTRAVENOUS

## 2020-02-15 MED ORDER — INSULIN PUMP
Freq: Three times a day (TID) | SUBCUTANEOUS | Status: DC
  Administered 2020-02-15: 10 via SUBCUTANEOUS
  Administered 2020-02-16: 5.4 via SUBCUTANEOUS
  Administered 2020-02-16: 1 via SUBCUTANEOUS
  Administered 2020-02-17: 9 via SUBCUTANEOUS
  Administered 2020-02-18: 5 via SUBCUTANEOUS
  Filled 2020-02-15: qty 1

## 2020-02-15 MED ORDER — HEPARIN (PORCINE) IN NACL 1000-0.9 UT/500ML-% IV SOLN
INTRAVENOUS | Status: AC
  Filled 2020-02-15: qty 1000

## 2020-02-15 MED ORDER — LIDOCAINE HCL (PF) 1 % IJ SOLN
INTRAMUSCULAR | Status: DC | PRN
  Administered 2020-02-15: 15 mL

## 2020-02-15 MED ORDER — ATORVASTATIN CALCIUM 80 MG PO TABS
80.0000 mg | ORAL_TABLET | Freq: Every day | ORAL | Status: DC
  Filled 2020-02-15: qty 1

## 2020-02-15 MED ORDER — MIDAZOLAM HCL 2 MG/2ML IJ SOLN
INTRAMUSCULAR | Status: DC | PRN
  Administered 2020-02-15: 1 mg via INTRAVENOUS

## 2020-02-15 MED ORDER — SODIUM CHLORIDE 0.9% FLUSH
3.0000 mL | Freq: Two times a day (BID) | INTRAVENOUS | Status: DC
  Administered 2020-02-15 – 2020-02-18 (×5): 3 mL via INTRAVENOUS

## 2020-02-15 MED ORDER — MORPHINE SULFATE (PF) 2 MG/ML IV SOLN: 2.0000 mg | INTRAVENOUS | Status: DC | PRN

## 2020-02-15 MED ORDER — CLOPIDOGREL BISULFATE 75 MG PO TABS
75.0000 mg | ORAL_TABLET | Freq: Every day | ORAL | Status: DC
  Administered 2020-02-16 – 2020-02-17 (×2): 75 mg via ORAL
  Filled 2020-02-15 (×3): qty 1

## 2020-02-15 MED ORDER — FENTANYL CITRATE (PF) 100 MCG/2ML IJ SOLN
INTRAMUSCULAR | Status: AC
  Filled 2020-02-15: qty 2

## 2020-02-15 MED ORDER — PROPYLENE GLYCOL 0.6 % OP SOLN: 1.0000 [drp] | Freq: Every day | OPHTHALMIC | Status: DC | PRN

## 2020-02-15 MED ORDER — ASPIRIN 81 MG PO CHEW: 81.0000 mg | CHEWABLE_TABLET | Freq: Every day | ORAL | Status: DC

## 2020-02-15 MED ORDER — IOHEXOL 350 MG/ML SOLN
INTRAVENOUS | Status: DC | PRN
  Administered 2020-02-15: 70 mL

## 2020-02-15 MED ORDER — FUROSEMIDE 40 MG PO TABS: 40.0000 mg | ORAL_TABLET | Freq: Two times a day (BID) | ORAL | Status: DC

## 2020-02-15 MED ORDER — FUROSEMIDE 10 MG/ML IJ SOLN
INTRAMUSCULAR | Status: DC | PRN
  Administered 2020-02-15: 40 mg via INTRAVENOUS

## 2020-02-15 MED ORDER — HEPARIN (PORCINE) IN NACL 1000-0.9 UT/500ML-% IV SOLN
INTRAVENOUS | Status: DC | PRN
  Administered 2020-02-15 (×2): 500 mL

## 2020-02-15 MED ORDER — SODIUM CHLORIDE 0.9% FLUSH
3.0000 mL | Freq: Two times a day (BID) | INTRAVENOUS | Status: DC
  Administered 2020-02-16 – 2020-02-18 (×3): 3 mL via INTRAVENOUS

## 2020-02-15 SURGICAL SUPPLY — 13 items
CATH INFINITI 5 FR RCB (CATHETERS) ×2 IMPLANT
CATH INFINITI 5FR MULTPACK ANG (CATHETERS) ×2 IMPLANT
CATH SWAN GANZ 7F STRAIGHT (CATHETERS) ×2 IMPLANT
CLOSURE MYNX CONTROL 5F (Vascular Products) ×2 IMPLANT
CLOSURE MYNX CONTROL 6F/7F (Vascular Products) ×2 IMPLANT
KIT HEART LEFT (KITS) ×2 IMPLANT
PACK CARDIAC CATHETERIZATION (CUSTOM PROCEDURE TRAY) ×2 IMPLANT
SHEATH PINNACLE 5F 10CM (SHEATH) ×2 IMPLANT
SHEATH PINNACLE 7F 10CM (SHEATH) ×2 IMPLANT
SHEATH PROBE COVER 6X72 (BAG) ×2 IMPLANT
TRANSDUCER W/STOPCOCK (MISCELLANEOUS) ×2 IMPLANT
WIRE EMERALD 3MM-J .025X260CM (WIRE) ×2 IMPLANT
WIRE EMERALD 3MM-J .035X150CM (WIRE) ×2 IMPLANT

## 2020-02-15 NOTE — Progress Notes (Signed)
Unna boots placed by [employer]. Patient tolerated well. Currently ambulating in hallway. No complaints at this time. Will continue to monitor.

## 2020-02-15 NOTE — Interval H&P Note (Signed)
Cath Lab Visit (complete for each Cath Lab visit)  Clinical Evaluation Leading to the Procedure:   ACS: No.  Non-ACS:    Anginal Classification: CCS II  Anti-ischemic medical therapy: Maximal Therapy (2 or more classes of medications)  Non-Invasive Test Results: Low-risk stress test findings: cardiac mortality <1%/year  Prior CABG: Previous CABG      History and Physical Interval Note:  02/15/2020 12:23 PM  Juan [name]  has presented today for surgery, with the diagnosis of shortness of breath.  The various methods of treatment have been discussed with the patient and family. After consideration of risks, benefits and other options for treatment, the patient has consented to  Procedure(s): RIGHT/LEFT HEART CATH AND CORONARY/GRAFT ANGIOGRAPHY (N/A) as a surgical intervention.  The patient's history has been reviewed, patient examined, no change in status, stable for surgery.  I have reviewed the patient's chart and labs.  Questions were answered to the patient's satisfaction.     Quay Burow

## 2020-02-15 NOTE — Progress Notes (Signed)
Orthopedic Tech Progress Note Patient Details:  Juan [name] [birth year] [id number]  Ortho Devices Type of Ortho Device: Haematologist Ortho Device/Splint Location: Bi LE Ortho Device/Splint Interventions: Application, Adjustment   Post Interventions Patient Tolerated: Well   Juan [name] Juan [name] 02/15/2020, 8:57 PM

## 2020-02-15 NOTE — Plan of Care (Signed)

## 2020-02-15 NOTE — Research (Signed)
[place] Informed Consent   Subject Name: Juan [name]  Subject met inclusion and exclusion criteria.  The informed consent form, study requirements and expectations were reviewed with the subject and questions and concerns were addressed prior to the signing of the consent form.  The subject verbalized understanding of the trail requirements.  The subject agreed to participate in the [facility] trial and signed the informed consent.  The informed consent was obtained prior to performance of any protocol-specific procedures for the subject.  A copy of the signed informed consent was given to the subject and a copy was placed in the subject's medical record.  Mena Goes. 02/15/2020, 0915 am

## 2020-02-15 NOTE — Consult Note (Addendum)
Advanced Heart Failure Team Consult Note   Primary Physician: Patient, No Pcp Per PCP-Cardiologist:  Quay Burow, MD  Reason for Consultation: Acute on chronic systolic heart failure  HPI:    Juan [name] is seen today for evaluation of acute on chronic systolic heart failure at the request of Dr. Gwenlyn Found, Cardiology.  54 y/o male w/ h/o systolic HF, EF 87% on most recent echo, 3VCAD s/p CABG in 2014 followed by stenting to SVG-RCA in 2018, IDDM, severe hypertriglyceridemia w/ subsequent episodes of pancreatitis. Family history unknown as he was adopted.   Echo in 2017 showed normal LVEF 60-65%. Repeat Echo 4/21 showed mildly reduced LVEF down to 45%. Had subsequent NST 4/21 that showed old scar but no ischemia.   Seen recently in cardiology clinic given progressive dyspnea and wt gain. Tried increasing home diuretics w/o much improvement in volume or symptoms. Echo repeated showing stable LVEF at 45%. RV systolic function low normal. Mild-mod MR also noted.   Given persistent symptoms, he was set up for [facility] that was done today by Dr. Gwenlyn Found. This shows progression of his native and graft disease.  All vein grafts are functionally occluded with minimal left to right collaterals.  His LIMA is patent although his LAD is diffusely diseased. No targets for intervention and not a candidate for redo CABG. Plan is for medical therapy. His RHC showed mPCWP mildly elevated at 19. LVEDP 23. CO by Fick 3.25, CI 1.48, by thermo calculation CO 7, CI 3.2.   He is markedly edematous and is being admitted for IV diuretics. There is also concern that his MR may be contributing to symptoms. TTE showed only mild-mod MR but he is being considered for TEE to better define the valve.   Echo 01/29/20  Left ventricular ejection fraction, by estimation, is 45%. The left ventricle has mildly decreased function. There is mild left ventricular hypertrophy. Indeterminate diastolic filling due to E-A fusion. 2.  Right ventricular systolic function is low normal. The right ventricular size is normal. 3. Left atrial size was mildly dilated. 4. The mitral valve is abnormal. Mild to moderate mitral valve regurgitation. 5. The aortic valve is normal in structure. Aortic valve regurgitation is not visualized. 6. The inferior vena cava is normal in size with greater than 50% respiratory variability, suggesting right atrial pressure of 3 mmHg.  R/LHC 11/298/21   Prox RCA lesion is 95% stenosed.  Mid RCA lesion is 100% stenosed.  Ost LM to Mid LM lesion is 40% stenosed.  Prox Cx to Mid Cx lesion is 100% stenosed.  Mid LAD lesion is 100% stenosed.  1st Diag lesion is 90% stenosed.  Origin lesion is 100% stenosed.  Origin to Prox Graft lesion is 100% stenosed.  Dist Cx lesion is 100% stenosed.  Right Heart Pressures Right atrial pressure-8/9 Right ventricular pressure-39/3 Pulmonary artery pressure-37/14, mean 20 Pulmonary wedge pressure-A-wave 22, V wave 27, mean 19 LVEDP-23 Cardiac output-3.25 L/min with an index of 1.48 L/min/m by Fick, 7 L/min with an index of 3.2 L/min/m by thermodilution     Review of Systems: [y] = yes, [ ]  = no   . General: Weight gain [ ] ; Weight loss [ ] ; Anorexia [ ] ; Fatigue [ Y]; Fever [ ] ; Chills [ ] ; Weakness [ ]   . Cardiac: Chest pain/pressure [ ] ; Resting SOB [ Y]; Exertional SOB [Y ]; Orthopnea [Y ]; Pedal Edema [Y ]; Palpitations [ ] ; Syncope [ ] ; Presyncope [ ] ; Paroxysmal nocturnal dyspnea[ ]   .  Pulmonary: Cough [ ] ; Wheezing[ ] ; Hemoptysis[ ] ; Sputum [ ] ; Snoring [ ]   . GI: Vomiting[ ] ; Dysphagia[ ] ; Melena[ ] ; Hematochezia [ ] ; Heartburn[ ] ; Abdominal pain [ ] ; Constipation [ ] ; Diarrhea [ ] ; BRBPR [ ]   . GU: Hematuria[ ] ; Dysuria [ ] ; Nocturia[ ]   . Vascular: Pain in legs with walking [ ] ; Pain in feet with lying flat [ ] ; Non-healing sores [ Y]; Stroke [ ] ; TIA [ ] ; Slurred speech [ ] ;  . Neuro: Headaches[ ] ; Vertigo[ ] ; Seizures[ ] ; Paresthesias[  ];Blurred vision [ ] ; Diplopia [ ] ; Vision changes [ ]   . Ortho/Skin: Arthritis [ ] ; Joint pain [ ] ; Muscle pain [ ] ; Joint swelling [ ] ; Back Pain [ ] ; Rash [ ]   . Psych: Depression[ ] ; Anxiety[ ]   . Heme: Bleeding problems [ ] ; Clotting disorders [ ] ; Anemia [ ]   . Endocrine: Diabetes [ ] ; Thyroid dysfunction[ ]   Home Medications Prior to Admission medications   Medication Sig Start Date End Date Taking? Authorizing Provider  aspirin 81 MG chewable tablet Chew 1 tablet (81 mg total) by mouth daily. 12/19/16  Yes Reino Bellis B, NP  clopidogrel (PLAVIX) 75 MG tablet TAKE 1 TABLET BY MOUTH DAILY WITH BREAKFAST. Patient taking differently: Take 75 mg by mouth daily.  05/18/19  Yes Lorretta Harp, MD  fenofibrate 160 MG tablet Take 160 mg by mouth daily.    Yes [provider]  furosemide (LASIX) 40 MG tablet Take 1 tablet (40 mg total) by mouth 2 (two) times daily. 01/06/20  Yes Lorretta Harp, MD  insulin regular human CONCENTRATED (HUMULIN R) 500 UNIT/ML SOLN injection Inject into the skin continuous. Using insulin pump   Yes [provider]  isosorbide mononitrate (IMDUR) 30 MG 24 hr tablet Take 1 tablet (30 mg total) by mouth daily. 11/12/19 05/10/20 Yes Hilty, Nadean Corwin, MD  metFORMIN (GLUCOPHAGE) 1000 MG tablet Take 1,000 mg by mouth 2 (two) times daily with a meal.   Yes [provider]  metoprolol succinate (TOPROL-XL) 25 MG 24 hr tablet Take 3 tablets (75 mg total) by mouth 2 (two) times daily. Take with or immediately following a meal. 06/18/19 06/12/20 Yes Lorretta Harp, MD  Propylene Glycol (SYSTANE BALANCE) 0.6 % SOLN Place 1 drop into both eyes daily as needed (dry eyes).   Yes [provider]  ramipril (ALTACE) 5 MG capsule Take 5 mg by mouth daily. 06/23/15  Yes [provider]  rosuvastatin (CRESTOR) 20 MG tablet Take 1 tablet (20 mg total) by mouth daily at 6 PM. 06/18/19 06/12/20 Yes Lorretta Harp, MD  VASCEPA 1 g capsule TAKE 2  CAPSULES BY MOUTH TWICE A DAY Patient taking differently: Take 2 g by mouth 2 (two) times daily.  12/08/19  Yes Lorretta Harp, MD  nitroGLYCERIN (NITROSTAT) 0.4 MG SL tablet Place 1 tablet (0.4 mg total) under the tongue every 5 (five) minutes as needed. 06/18/19   Lorretta Harp, MD    Past Medical History: Past Medical History:  Diagnosis Date  . Abnormal cardiovascular stress test 02/06/2013  . Anemia    occasional - no problems currently(10/02/2011)  . Bilateral renal cysts 06/16/2011   states no known problems  . Blood transfusion without reported diagnosis   . CAD (coronary artery disease), LAD 90%, 1st diag 95%, LCX 70% wth AV groove 90%, RCA 40-50% mid and 80% long distal stenosis 02/03/13 02/04/2013  . Chest pain    positive Myoview  stress test  . Coronary artery calcification seen on CAT scan   . Diabetes mellitus    IDDM  . Fatty liver disease, nonalcoholic 6/60/6301  . Hyperlipidemia   . Hypertension    states no dx. of HTN, takes med. to protect kidneys due to DM  . Lateral meniscus tear 09/2011   left  . Loose body in knee 09/2011   loose bodies left knee  . Non Hodgkin's lymphoma ([place]) 1991  . Pancreatitis    occasional - last episode 06/2011  . S/P CABG x 4, 02/05/13 LIMA-LAD; LT. RADIAL-OM;VG-DIAG; VG-PDA 02/06/2013   10/18 3/4 patent grafts (occluded SVG-->Diag), PCI/DESx 3 SVG-->RCA, normal EF  . Splenomegaly, congestive, chronic   . Stuffy and runny nose 10/02/2011   yellow drainage from nose    Past Surgical History: Past Surgical History:  Procedure Laterality Date  . ABDOMINAL AORTIC ANEURYSM REPAIR    . ANTERIOR CERVICAL DECOMP/DISCECTOMY FUSION N/A 03/26/2018   Procedure: ANTERIOR CERVICAL DECOMPRESSION FUSION CERVICAL 5-6 WITH INSTRUMENTATION AND ALLOGRAFT;  Surgeon: Phylliss Bob, MD;  Location: [place];  Service: Orthopedics;  Laterality: N/A;  . CORONARY ARTERY BYPASS GRAFT N/A 02/05/2013   Procedure: CORONARY ARTERY BYPASS GRAFTING (CABG);   Surgeon: Ivin Poot, MD;  Location: [place];  Service: Open Heart Surgery;  Laterality: N/A;  Coronary artery bypass graft on pump times four using left internal mammary artery and right greater saphenous vein via endovein harvest and left radial artery harvest.   . CORONARY STENT INTERVENTION  12/17/2016    PCI and drug-eluting stenting of the mid and distal RCA SVG   . CORONARY STENT INTERVENTION N/A 12/17/2016   Procedure: CORONARY STENT INTERVENTION;  Surgeon: Lorretta Harp, MD;  Location: [place] [facility];  Service: Cardiovascular;  Laterality: N/A;  . ELBOW SURGERY    . HERNIA REPAIR     inguinal   . herniated disc    . INTRAOPERATIVE TRANSESOPHAGEAL ECHOCARDIOGRAM N/A 02/05/2013   Procedure: INTRAOPERATIVE TRANSESOPHAGEAL ECHOCARDIOGRAM;  Surgeon: Ivin Poot, MD;  Location: [place];  Service: Open Heart Surgery;  Laterality: N/A;  . KNEE ARTHROSCOPY  10/09/2011   Procedure: ARTHROSCOPY KNEE;  Surgeon: Nita Sells, MD;  Location: [place];  Service: Orthopedics;  Laterality: Left;  . LEFT HEART CATH AND CORS/GRAFTS ANGIOGRAPHY N/A 12/17/2016   Procedure: LEFT HEART CATH AND CORS/GRAFTS ANGIOGRAPHY;  Surgeon: Lorretta Harp, MD;  Location: [place] [facility];  Service: Cardiovascular;  Laterality: N/A;  . LEFT HEART CATHETERIZATION WITH CORONARY ANGIOGRAM N/A 02/03/2013   Procedure: LEFT HEART CATHETERIZATION WITH CORONARY ANGIOGRAM;  Surgeon: Lorretta Harp, MD;  Location: [facility] CATH LAB;  Service: Cardiovascular;  Laterality: N/A;  . NASAL SEPTUM SURGERY    . RADIAL ARTERY HARVEST Left 02/05/2013   Procedure: RADIAL ARTERY HARVEST;  Surgeon: Ivin Poot, MD;  Location: [place];  Service: Vascular;  Laterality: Left;  . TIBIA BONE BIOPSY  x 3   left  . TONSILLECTOMY      Family History: Family History  Adopted: Yes    Social History: Social History   Socioeconomic History  . Marital status: Married    Spouse name: [name]  . Number  of children: 1  . Years of education: 72  . Highest education level: Not on file  Occupational History  . Occupation: Engineer, drilling     Employer: [employer]  Tobacco Use  . Smoking status: Never Smoker  . Smokeless tobacco: Never Used  Vaping  Use  . Vaping Use: Never used  Substance and Sexual Activity  . Alcohol use: No  . Drug use: No  . Sexual activity: Yes  Other Topics Concern  . Not on file  Social History Narrative   Adopted, so no pertinent FH.  Married.  Lives with wife in [place].  Independent of ADLs and ambulation.   Social Determinants of Health   Financial Resource Strain:   . Difficulty of Paying Living Expenses: Not on file  Food Insecurity:   . Worried About Charity fundraiser in the Last Year: Not on file  . Ran Out of Food in the Last Year: Not on file  Transportation Needs:   . Lack of Transportation (Medical): Not on file  . Lack of Transportation (Non-Medical): Not on file  Physical Activity:   . Days of Exercise per Week: Not on file  . Minutes of Exercise per Session: Not on file  Stress:   . Feeling of Stress : Not on file  Social Connections:   . Frequency of Communication with Friends and Family: Not on file  . Frequency of Social Gatherings with Friends and Family: Not on file  . Attends Religious Services: Not on file  . Active Member of Clubs or Organizations: Not on file  . Attends Archivist Meetings: Not on file  . Marital Status: Not on file    Allergies:  Allergies  Allergen Reactions  . Reglan [Metoclopramide] Other (See Comments)    Only can tolerate in low doses, in higher doses it has the opposite effect    Objective:    Vital Signs:   Temp:  [98.7 F (37.1 C)] 98.7 F (37.1 C) (11/29 0856) Pulse Rate:  [12-81] 78 (11/29 1336) Resp:  [8-22] 13 (11/29 1336) BP: (99-126)/(70-85) 99/77 (11/29 1336) SpO2:  [94 %-99 %] 99 % (11/29 1322) Weight:  [103.4 kg] 103.4 kg (11/29 0856)     Weight change: Filed Weights   02/15/20 0856  Weight: 103.4 kg    Intake/Output:  No intake or output data in the 24 hours ending 02/15/20 1413    Physical Exam    General:  Well appearing, moderately obese. No resp difficulty HEENT: normal Neck: supple. JVP 10 cm . Carotids 2+ bilat; no bruits. No lymphadenopathy or thyromegaly appreciated. Cor: PMI nondisplaced. Regular rate & rhythm. No rubs, gallops or murmurs. Lungs: clear  Abdomen: soft, nontender, nondistended. No hepatosplenomegaly. No bruits or masses. Good bowel sounds. Extremities: no cyanosis, clubbing, rash, 3+ bilateral LEE edema Neuro: alert & orientedx3, cranial nerves grossly intact. moves all 4 extremities w/o difficulty. Affect pleasant   Telemetry   NSR 90s   EKG    No new EKG to review   Labs   Basic Metabolic Panel: Recent Labs  Lab 02/09/20 1554  NA 133*  K 4.4  CL 94*  CO2 18*  GLUCOSE 79  BUN 60*  CREATININE 1.52*  CALCIUM 10.6*    Liver Function Tests: No results for input(s): AST, ALT, ALKPHOS, BILITOT, PROT, ALBUMIN in the last 168 hours. No results for input(s): LIPASE, AMYLASE in the last 168 hours. No results for input(s): AMMONIA in the last 168 hours.  CBC: Recent Labs  Lab 02/09/20 1554  WBC 8.3  HGB 14.1  HCT 41.2  MCV 76*  PLT 147*    Cardiac Enzymes: No results for input(s): CKTOTAL, CKMB, CKMBINDEX, TROPONINI in the last 168 hours.  BNP: BNP (last 3 results) No results for input(s): BNP  in the last 8760 hours.  ProBNP (last 3 results) No results for input(s): PROBNP in the last 8760 hours.   CBG: Recent Labs  Lab 02/15/20 0906 02/15/20 1345  GLUCAP 180* 114*    Coagulation Studies: No results for input(s): LABPROT, INR in the last 72 hours.   Imaging   CARDIAC CATHETERIZATION  Result Date: 02/15/2020  Prox RCA lesion is 95% stenosed.  Mid RCA lesion is 100% stenosed.  Ost LM to Mid LM lesion is 40% stenosed.  Prox Cx to Mid Cx  lesion is 100% stenosed.  Mid LAD lesion is 100% stenosed.  1st Diag lesion is 90% stenosed.  Origin lesion is 100% stenosed.  Origin to Prox Graft lesion is 100% stenosed.  Dist Cx lesion is 100% stenosed.  Juan [name] is a 54 y.o. male  [id number] LOCATION:  FACILITY: [facility] PHYSICIAN: Quay Burow, M.D. 04/17/1965 DATE OF PROCEDURE:  02/15/2020 DATE OF DISCHARGE: CARDIAC CATHETERIZATION History obtained from chart review.Juan [name] is a 54 y.o.  mild to moderately overweight, married Caucasian male, father of 1 child who I lastsaw in the office 01/06/2020.He has seen Dr. Sallyanne Kuster twice in 2011. He has a long history of insulin-dependent diabetes as well as extremely high hypertriglyceridemia in the 3000-6000 range with multiple episodes of pancreatitis. He had negative venous Dopplers for DVTs. He does wear compression stockings. He has complained of left inframammary chest pain and increasing shortness of breath. He is not aware of his family history since he was adopted. He had a CT scan of his chest and abdomen which showed a mass at the tail of his pancreas but also showed severe calcification in the LAD. He did have a Myoview stress test performed 2 years ago that was nonischemic. At that time, he had no symptoms of chest pain or shortness of breath. I referred him to Dr. Lavetta Nielsen at [facility] for more intense treatment of his severe hypertriglyceridemia.since I saw him last in December of last several weeks he developed exertional chest pain and shortness of breath.I was concerned that he has developed progression of disease given all of his risk factors.  He had a Myoview stress test performed that showed inferior ischemia which is high risk. He continued to have exertional chest pain with left upper irradiation.I performed cardiac catheterization on him 02/03/13 revealing three-vessel disease and preserved LV function. He subsequently underwent coronary artery bypass  bypass grafting x4 by Dr. Tharon Aquas Trigt with a LIMA to his LAD, a left radial to obtuse marginal branch, vein graft to an diagonal branch and PDA. His postop course was uncomplicated. He completed 7 weeks her cardiac rehabilitation and is back to work.  Since I saw him in the office 4 months ago he has seen Kerin Ransom back who ordered a 2-D echo that showed normal LV systolic function, grade 2 diastolic dysfunction and a Myoview stress test that was normal as well.  He wascomplaining of chest pain and left upper extremity pain. Based on this I performed outpatient cardiac catheterization on him 12/17/16 revealing high-grade disease in his RCA SVG which is stented using several synergy drug-eluting stents. This resulted in resolution of his anginal symptoms. He also has recurrent cellulitis of his right lower extremity on antibiotics with chronic lower extremity edema for which she uses compression stockings.  Hewas admitted with cellulitis and sepsis, acute renal insufficiency and demand ischemia with positive enzymes in the 4000 range. 2D echo during cephalization revealed EF of  45 to 50%. A Myoview stress test performed 06/26/2019 revealed inferior scar without ischemia. This was a new finding compared to his last Myoview in 2017. It certainly possible that the vein graft which I stented 10/18 has occluded although he is minimally symptomatic from this.  A  Myoview performed back in April did show inferior scar without ischemia with an EF of 45 to 50% by 2D echo. Since that time he is gained 13 pounds and has 2+ pitting edema. His chest pain has somewhat abated however his major complaint is progressive dyspnea on exertion.  Since I saw him a month ago I did double his furosemide however this has not significantly improved his dyspnea.  He still has 2+ pitting edema.  A 2D echo showed an EF of 45% with mild to moderate MR and a Myoview stress test showed inferolateral scar fairly similar to his  Myoview performed 06/26/2019.  Given his progressive symptoms and the fact that his last cardiac cath was over 3 years ago I feel compelled to relook at his  anatomy and physiology.   Mr. Kerlin unfortunately had progression of his native and graft disease.  All vein grafts are functionally occluded with minimal left to right collaterals.  His LIMA is patent although his LAD is a diabetic diffusely diseased vessel.  His systolic pressure is in the 90 range and his wedge pressure mean of 19 with a V wave of 27.  I reviewed his history, anatomy and physiology with Dr. Haroldine Laws in the advanced heart failure clinic who is agreed to take him on his service and optimize his medications, and oversee intravenous diuresis.  He may also need transesophageal echo to better define his mitral valve. Quay Burow. MD, [facility] 02/15/2020 1:45 PM      Medications:     Current Medications: . aspirin  81 mg Oral Pre-Cath  . furosemide  80 mg Intravenous Q12H     Infusions: . sodium chloride    . sodium chloride 50 mL/hr at 02/15/20 1347  . sodium chloride 50 mL/hr (02/15/20 1312)      Assessment/Plan   1. Acute on Chronic Systolic Heart Failure - Echo 2017 showed normal LVEF 60-65% - Echo 4/21, EF mildly reduced 45%. NST showed scar but no ischemia - Echo 11/21, EF 70%, RV systolic function low normal  - Complains of several month history of progressive dyspnea - LHC today shows progression of native vessel CAD and grafts. All vein grafts are functionally occluded with minimal left to right collaterals.  His LIMA is patent although his LAD is diffusely diseased (diabetic). No targets for intervention and not a candidate for redo CABG. Plan is for medical therapy. - RHC shows mildly elevated filling pressures, PCWP 19. LVEDP 23. CO by Fick 3.25, CI 1.48, by thermo calculation CO 7, CI 3.2. - Plan to admit for IV diuresis, Lasix 80 mg bid  - Add unna boots - if poor response to IV Lasix, may need trial  of inotrope to augment diuresis  - hold ACEi (altace). Would benefit from Entresto (EF <57%). Can bridge w/ Losartan for BP coverage.    2. CAD - s/p CABG in 2014, followed by PCI to [place] in 2018 - LHC today shows progression of native vessel CAD and grafts. All vein grafts are functionally occluded with minimal left to right collaterals.  His LIMA is patent although his LAD is diffusely diseased (diabetic). No targets for intervention and not a candidate for redo CABG. Plan is  for medical therapy. - Cont ASA, Plavix, statin, LA nitrate +  blocker  3. Mitral Regurgitation  - mild-mod MR noted on recent TTE 11/21 but ? If more severe  - concern may be contributing to recent symptoms - Plan TEE this admit to better define the valve  - if severe, may be candidate for MitraClip   4. IDDM - has insulin pump   5. Severe Hypertriglyceridemia  - has been as high as 6,000 - likely familial but he does not know family history (adopted).  - has had multiple occurences of pancreatitis. Denies ETOH use   - on Vascepa and fenofibrate as outpatient. Continue      Length of Stay: 0  Lyda Jester, PA-C  02/15/2020, 2:13 PM  Advanced Heart Failure Team Pager [phone] (M-F; 7a - 4p)  Please contact [facility] for night-coverage after hours (4p -7a ) and weekends on [email or web address]  Patient seen and examined with the above-signed Advanced Practice Provider and/or Housestaff. I personally reviewed laboratory data, imaging studies and relevant notes. I independently examined the patient and formulated the important aspects of the plan. I have edited the note to reflect any of my changes or salient points. I have personally discussed the plan with the patient and/or family.  54 y/o male with Type I DM, CAD s/p CABG, mild iCM EF 45% admitted for progressive HF with LE edema.   Continue to work FT at [employer] but struggling with progressive DOE and LE edema.  Echo EF 45-50% with at least  moderate MR and moderate RV dysfunction.  Brought in for outpatient cath today. Results as above. PCW only mildly elevated with mean 19 however prominine v-wave to almost 30 suggestive of significant MR. Fick output low.   General:  Ltyng in bed No resp difficulty HEENT: normal Neck: supple. JVP 8  Carotids 2+ bilat; no bruits. No lymphadenopathy or thryomegaly appreciated. Cor: PMI nondisplaced. Regular rate & rhythm. 2/6 MR Lungs: clear Abdomen: soft, nontender, nondistended. No hepatosplenomegaly. No bruits or masses. Good bowel sounds. Extremities: no cyanosis, clubbing, rash, 3+  edema Neuro: alert & orientedx3, cranial nerves grossly intact. moves all 4 extremities w/o difficulty. Affect pleasant  Cath results reviewed with him. Severe LE edema out proportion to RHC findings but numbers suggest significant MR and possible low output.   Will admit for IV diuresis. Wrap legs. Plan TEE to further evaluate MR.   Glori Bickers, MD  7:20 PM

## 2020-02-16 ENCOUNTER — Encounter (HOSPITAL_COMMUNITY): Payer: Self-pay | Admitting: Cardiovascular Disease

## 2020-02-16 DIAGNOSIS — Z0279 Encounter for issue of other medical certificate: Secondary | ICD-10-CM

## 2020-02-16 DIAGNOSIS — Z951 Presence of aortocoronary bypass graft: Secondary | ICD-10-CM | POA: Diagnosis not present

## 2020-02-16 DIAGNOSIS — I5043 Acute on chronic combined systolic (congestive) and diastolic (congestive) heart failure: Secondary | ICD-10-CM | POA: Diagnosis not present

## 2020-02-16 LAB — BASIC METABOLIC PANEL
Anion gap: 7 (ref 5–15)
BUN: 27 mg/dL — ABNORMAL HIGH (ref 6–20)
CO2: 24 mmol/L (ref 22–32)
Calcium: 9.6 mg/dL (ref 8.9–10.3)
Chloride: 99 mmol/L (ref 98–111)
Creatinine, Ser: 1 mg/dL (ref 0.61–1.24)
GFR, Estimated: >60 mL/min (ref >60)
Glucose, Bld: 114 mg/dL — ABNORMAL HIGH (ref 70–99)
Potassium: 3.6 mmol/L (ref 3.5–5.1)
Sodium: 130 mmol/L — ABNORMAL LOW (ref 135–145)

## 2020-02-16 LAB — GLUCOSE, CAPILLARY
Glucose-Capillary: 161 mg/dL — ABNORMAL HIGH (ref 70–99)
Glucose-Capillary: 96 mg/dL (ref 70–99)
Glucose-Capillary: 164 mg/dL — ABNORMAL HIGH (ref 70–99)
Glucose-Capillary: 96 mg/dL (ref 70–99)
Glucose-Capillary: 184 mg/dL — ABNORMAL HIGH (ref 70–99)

## 2020-02-16 LAB — CBC
HCT: 40 % (ref 39.0–52.0)
Hemoglobin: 12.9 g/dL — ABNORMAL LOW (ref 13.0–17.0)
MCH: 25.6 pg — ABNORMAL LOW (ref 26.0–34.0)
MCHC: 32.3 g/dL (ref 30.0–36.0)
MCV: 79.4 fL — ABNORMAL LOW (ref 80.0–100.0)
Platelets: 100 10*3/uL — ABNORMAL LOW (ref 150–400)
RBC: 5.04 MIL/uL (ref 4.22–5.81)
RDW: 16.3 % — ABNORMAL HIGH (ref 11.5–15.5)
WBC: 7.5 10*3/uL (ref 4.0–10.5)
nRBC: 0 % (ref 0.0–0.2)

## 2020-02-16 MED ORDER — LOSARTAN POTASSIUM 25 MG PO TABS
25.0000 mg | ORAL_TABLET | Freq: Every day | ORAL | Status: DC
  Administered 2020-02-17 – 2020-02-18 (×2): 25 mg via ORAL
  Filled 2020-02-16 (×2): qty 1

## 2020-02-16 MED ORDER — POTASSIUM CHLORIDE CRYS ER 20 MEQ PO TBCR
40.0000 meq | EXTENDED_RELEASE_TABLET | Freq: Once | ORAL | Status: AC
  Administered 2020-02-16: 40 meq via ORAL
  Filled 2020-02-16: qty 2

## 2020-02-16 NOTE — Progress Notes (Addendum)
Advanced Heart Failure Rounding Note  PCP-Cardiologist: Quay Burow, MD   Subjective:    Good diuresis yesterday w/ IV Lasix, -4 L in UOP.   SCr 1.00. K 3.6.   BP a bit soft but stable, low 782N systolic.  Remains fluid overloaded. No resting dyspnea.   Refusing Lipitor.     Objective:   Weight Range: 104.3 kg Body mass index is 32.99 kg/m.   Vital Signs:   Temp:  [97.7 F (36.5 C)-98.7 F (37.1 C)] 98.7 F (37.1 C) (11/30 1128) Pulse Rate:  [12-81] 72 (11/30 1128) Resp:  [8-22] 20 (11/30 1128) BP: (99-126)/(65-87) 99/65 (11/30 1128) SpO2:  [94 %-100 %] 95 % (11/30 1128) Weight:  [103.8 kg-104.3 kg] 104.3 kg (11/30 0139) Last BM Date: 02/15/20  Weight change: Filed Weights   02/15/20 0856 02/15/20 1630 02/16/20 0139  Weight: 103.4 kg 103.8 kg 104.3 kg    Intake/Output:   Intake/Output Summary (Last 24 hours) at 02/16/2020 1200 Last data filed at 02/16/2020 0900 Gross per 24 hour  Intake 2242.83 ml  Output 4475 ml  Net -2232.17 ml      Physical Exam    General:  Well appearing, moderately obese. No resp difficulty HEENT: Normal Neck: Supple. JVP elevated. Carotids 2+ bilat; no bruits. No lymphadenopathy or thyromegaly appreciated. Cor: PMI nondisplaced. Regular rate & rhythm. No rubs, gallops or murmurs. Lungs: Clear Abdomen: Soft, nontender, nondistended. No hepatosplenomegaly. No bruits or masses. Good bowel sounds. Extremities: No cyanosis, clubbing, rash, 2+ bilateral LE edema, bilateral unna boots  Neuro: Alert & orientedx3, cranial nerves grossly intact. moves all 4 extremities w/o difficulty. Affect pleasant   Telemetry   NSR 80s   EKG    No new EKG to review   Labs    CBC Recent Labs    02/15/20 1258 02/16/20 0343  WBC  --  7.5  HGB 12.2* 12.9*  HCT 36.0* 40.0  MCV  --  79.4*  PLT  --  562*   Basic Metabolic Panel Recent Labs    02/15/20 1258 02/16/20 0343  NA 138 130*  K 4.1 3.6  CL  --  99  CO2  --  24    GLUCOSE  --  114*  BUN  --  27*  CREATININE  --  1.00  CALCIUM  --  9.6   Liver Function Tests No results for input(s): AST, ALT, ALKPHOS, BILITOT, PROT, ALBUMIN in the last 72 hours. No results for input(s): LIPASE, AMYLASE in the last 72 hours. Cardiac Enzymes No results for input(s): CKTOTAL, CKMB, CKMBINDEX, TROPONINI in the last 72 hours.  BNP: BNP (last 3 results) No results for input(s): BNP in the last 8760 hours.  ProBNP (last 3 results) No results for input(s): PROBNP in the last 8760 hours.   D-Dimer No results for input(s): DDIMER in the last 72 hours. Hemoglobin A1C No results for input(s): HGBA1C in the last 72 hours. Fasting Lipid Panel No results for input(s): CHOL, HDL, LDLCALC, TRIG, CHOLHDL, LDLDIRECT in the last 72 hours. Thyroid Function Tests No results for input(s): TSH, T4TOTAL, T3FREE, THYROIDAB in the last 72 hours.  Invalid input(s): FREET3  Other results:   Imaging    CARDIAC CATHETERIZATION  Result Date: 02/15/2020  Prox RCA lesion is 95% stenosed.  Mid RCA lesion is 100% stenosed.  Ost LM to Mid LM lesion is 40% stenosed.  Prox Cx to Mid Cx lesion is 100% stenosed.  Mid LAD lesion is 100% stenosed.  1st  Diag lesion is 90% stenosed.  Origin lesion is 100% stenosed.  Origin to Prox Graft lesion is 100% stenosed.  Dist Cx lesion is 100% stenosed.  Juan [name] is a 54 y.o. male  [id number] LOCATION:  FACILITY: [facility] PHYSICIAN: Quay Burow, M.D. [birth year] DATE OF PROCEDURE:  02/15/2020 DATE OF DISCHARGE: CARDIAC CATHETERIZATION History obtained from chart review.Juan [name] is a 54 y.o.  mild to moderately overweight, married Caucasian male, father of 1 child who I lastsaw in the office 01/06/2020.He has seen Dr. Sallyanne Kuster twice in 2011. He has a long history of insulin-dependent diabetes as well as extremely high hypertriglyceridemia in the 3000-6000 range with multiple episodes of pancreatitis. He had negative venous Dopplers for  DVTs. He does wear compression stockings. He has complained of left inframammary chest pain and increasing shortness of breath. He is not aware of his family history since he was adopted. He had a CT scan of his chest and abdomen which showed a mass at the tail of his pancreas but also showed severe calcification in the LAD. He did have a Myoview stress test performed 2 years ago that was nonischemic. At that time, he had no symptoms of chest pain or shortness of breath. I referred him to Dr. Lavetta Nielsen at [facility] for more intense treatment of his severe hypertriglyceridemia.since I saw him last in December of last several weeks he developed exertional chest pain and shortness of breath.I was concerned that he has developed progression of disease given all of his risk factors.  He had a Myoview stress test performed that showed inferior ischemia which is high risk. He continued to have exertional chest pain with left upper irradiation.I performed cardiac catheterization on him 02/03/13 revealing three-vessel disease and preserved LV function. He subsequently underwent coronary artery bypass bypass grafting x4 by Dr. Tharon Aquas Trigt with a LIMA to his LAD, a left radial to obtuse marginal branch, vein graft to an diagonal branch and PDA. His postop course was uncomplicated. He completed 7 weeks her cardiac rehabilitation and is back to work.  Since I saw him in the office 4 months ago he has seen Kerin Ransom back who ordered a 2-D echo that showed normal LV systolic function, grade 2 diastolic dysfunction and a Myoview stress test that was normal as well.  He wascomplaining of chest pain and left upper extremity pain. Based on this I performed outpatient cardiac catheterization on him 12/17/16 revealing high-grade disease in his RCA SVG which is stented using several synergy drug-eluting stents. This resulted in resolution of his anginal symptoms. He also has recurrent cellulitis of his  right lower extremity on antibiotics with chronic lower extremity edema for which she uses compression stockings.  Hewas admitted with cellulitis and sepsis, acute renal insufficiency and demand ischemia with positive enzymes in the 4000 range. 2D echo during cephalization revealed EF of 45 to 50%. A Myoview stress test performed 06/26/2019 revealed inferior scar without ischemia. This was a new finding compared to his last Myoview in 2017. It certainly possible that the vein graft which I stented 10/18 has occluded although he is minimally symptomatic from this.  A  Myoview performed back in April did show inferior scar without ischemia with an EF of 45 to 50% by 2D echo. Since that time he is gained 13 pounds and has 2+ pitting edema. His chest pain has somewhat abated however his major complaint is progressive dyspnea on exertion.  Since I saw him a month  ago I did double his furosemide however this has not significantly improved his dyspnea.  He still has 2+ pitting edema.  A 2D echo showed an EF of 45% with mild to moderate MR and a Myoview stress test showed inferolateral scar fairly similar to his Myoview performed 06/26/2019.  Given his progressive symptoms and the fact that his last cardiac cath was over 3 years ago I feel compelled to relook at his  anatomy and physiology.   Juan [name] unfortunately had progression of his native and graft disease.  All vein grafts are functionally occluded with minimal left to right collaterals.  His LIMA is patent although his LAD is a diabetic diffusely diseased vessel.  His systolic pressure is in the 90 range and his wedge pressure mean of 19 with a V wave of 27.  I reviewed his history, anatomy and physiology with Dr. Haroldine Laws in the advanced heart failure clinic who is agreed to take him on his service and optimize his medications, and oversee intravenous diuresis.  He may also need transesophageal echo to better define his mitral valve. Quay Burow.  MD, [place] 02/15/2020 1:45 PM      Medications:     Scheduled Medications: . aspirin  81 mg Oral Daily  . atorvastatin  80 mg Oral Daily  . clopidogrel  75 mg Oral Q breakfast  . fenofibrate  160 mg Oral Daily  . furosemide  80 mg Intravenous Q12H  . icosapent Ethyl  2 g Oral BID  . insulin pump   Subcutaneous TID WC, HS, 0200  . isosorbide mononitrate  30 mg Oral Daily  . metoprolol succinate  75 mg Oral BID  . potassium chloride  40 mEq Oral Once  . ramipril  5 mg Oral Daily  . sodium chloride flush  3 mL Intravenous Q12H  . sodium chloride flush  3 mL Intravenous Q12H     Infusions: . sodium chloride       PRN Medications:  sodium chloride, acetaminophen, morphine injection, ondansetron (ZOFRAN) IV, polyvinyl alcohol, sodium chloride flush   Assessment/Plan    1. Acute on Chronic Systolic Heart Failure - Echo 2017 showed normal LVEF 60-65% - Echo 4/21, EF mildly reduced 45%. NST showed scar but no ischemia - Echo 11/21, EF 67%, RV systolic function low normal  - Complains of several month history of progressive dyspnea - LHC this showed progression of native vessel CAD and grafts.All vein grafts are functionally occluded with minimal left to right collaterals. His LIMA is patent although his LAD is diffusely diseased (diabetic). No targets for intervention and not a candidate for redo CABG. Plan is for medical therapy. - RHC showed mildly elevated filling pressures, PCWP 19. LVEDP 23. CO by Fick 3.25, CI 1.48, by thermo calculation CO 7, CI 3.2. - Good response to IV Lasix but remains fluid overloaded. SCr stable - Continue IV Lasix 80 mg bid  - Continue w/ unna boots - Stop ACEi (altace). Would benefit from Entresto (EF <57%). Will bridge w/ Losartan for BP coverage.    2. CAD - s/p CABG in 2014, followed by PCI to [facility] in 2018 - LHC today shows progression of native vessel CAD and grafts.All vein grafts are functionally occluded with minimal left to  right collaterals. His LIMA is patent although his LAD is diffusely diseased (diabetic). No targets for intervention and not a candidate for redo CABG. Plan is for medical therapy. - Cont ASA, Plavix, LA nitrate + ? blocker. Refusing statin  3. Mitral Regurgitation  - mild-mod MR noted on recent TTE 11/21 but ? If more severe  - concern may be contributing to recent symptoms - Plan TEE Thursday to better define the valve  - if severe, may be candidate for MitraClip   4. IDDM - has insulin pump   5. Severe Hypertriglyceridemia  - has been as high as 6,000 - likely familial but he does not know family history (adopted).  - has had multiple occurences of pancreatitis. Denies ETOH use   - on Vascepa and fenofibrate as outpatient. Continue      Length of Stay: 1  Brittainy Simmons, PA-C  02/16/2020, 12:00 PM  Advanced Heart Failure Team Pager [phone] (M-F; 7a - 4p)  Please contact [facility] for night-coverage after hours (4p -7a ) and weekends on [email or web address]  Patient seen and examined with the above-signed Advanced Practice Provider and/or Housestaff. I personally reviewed laboratory data, imaging studies and relevant notes. I independently examined the patient and formulated the important aspects of the plan. I have edited the note to reflect any of my changes or salient points. I have personally discussed the plan with the patient and/or family.  He is responding well to IV lasix. Breathing better. No CP. SBP 90-100 range. Refusing lipitor  General:  Sitting up in bed. No resp difficulty HEENT: normal Neck: supple.JVP 9-10. Carotids 2+ bilat; no bruits. No lymphadenopathy or thryomegaly appreciated. Cor: PMI nondisplaced. Regular rate & rhythm. Soft MR Lungs: clear Abdomen: soft, nontender, nondistended. No hepatosplenomegaly. No bruits or masses. Good bowel sounds. Extremities: no cyanosis, clubbing, rash, 1-2+ edema over unna boots Neuro: alert & orientedx3, cranial  nerves grossly intact. moves all 4 extremities w/o difficulty. Affect pleasant  Will continue diuresis as he still has volume on board. Suspect fluid overload due to a combination of LV/RV dysfunction and MR. Plan TEE to further evaluate MV once diuresed. I will defer further discussion about his lipid regimen to his primary cardiologist, Dr. Gwenlyn Found. I had advised him that high-potency statis are in his best interest.   Glori Bickers, MD  4:55 PM

## 2020-02-16 NOTE — Progress Notes (Signed)
Inpatient Diabetes Program Recommendations  AACE/ADA: New Consensus Statement on Inpatient Glycemic Control (2015)  Target Ranges:  Prepandial:   less than 140 mg/dL      Peak postprandial:   less than 180 mg/dL (1-2 hours)      Critically ill patients:  140 - 180 mg/dL   Lab Results  Component Value Date   GLUCAP 164 (H) 02/16/2020   HGBA1C 8.5 (H) 05/21/2019    Review of Glycemic Control Results for Juan [name], Juan [name] (MRN [id number]) as of 02/16/2020 12:49  Ref. Range 02/15/2020 16:31 02/15/2020 21:19 02/16/2020 01:38 02/16/2020 06:11 02/16/2020 11:27  Glucose-Capillary Latest Ref Range: 70 - 99 mg/dL 103 (H) 154 (H) 161 (H) 96 164 (H)   Diabetes history:  DM2 Outpatient Diabetes medications:  Medtronic Insulin pump with U-500 Current orders for Inpatient glycemic control:  Insulin Pump with U500   Current Insulin Pump Settings  Basal insulin  0000-0400       2.65 units/hour 0401-0700       2.90 units/hour 0701-2200       2.95 units/hour 2201-0000       2.50 units/hour Total daily basal insulin: 68.55 units of U-500 insulin (342.75 total units)/24 hours   Carb Coverage 0000-0600       1:4.8    1 unit for every 4.8 grams of carbohydrates 0601-1830       1:4.0    1 unit for every 4.0 grams of carbohydrates 1831-0000       1:5.0    1 unit for every 5.0 grams of carbohydrates  Target Glucose Goals 0000-0000       130 mg/dl  Note:  Spoke with patient at bedside.  He is current with [name] and sees her on a regular bases.  He is trying to work with MD and insurance for a Dexcom.  He currently checks his blood sugar with a glucometer daily and states he should check more than he does.  He admits to having difficulty limiting CHO's.  No current A1C drawn inpatient.  He states he recently had one drawn with [name] and it was around 8%.  Discussed importance of glucose control.    Will continue to follow while inpatient.  Thank you, Reche Dixon, RN, BSN Diabetes  Coordinator Inpatient Diabetes Program [phone] (team pager from 8a-5p)

## 2020-02-17 ENCOUNTER — Telehealth: Payer: Self-pay | Admitting: Cardiovascular Disease

## 2020-02-17 DIAGNOSIS — I5043 Acute on chronic combined systolic (congestive) and diastolic (congestive) heart failure: Secondary | ICD-10-CM | POA: Diagnosis not present

## 2020-02-17 DIAGNOSIS — Z951 Presence of aortocoronary bypass graft: Secondary | ICD-10-CM | POA: Diagnosis not present

## 2020-02-17 LAB — HEPATIC FUNCTION PANEL
ALT: 60 U/L — ABNORMAL HIGH (ref 0–44)
AST: 49 U/L — ABNORMAL HIGH (ref 15–41)
Albumin: 2.7 g/dL — ABNORMAL LOW (ref 3.5–5.0)
Alkaline Phosphatase: 49 U/L (ref 38–126)
Bilirubin, Direct: <0.1 mg/dL (ref 0.0–0.2)
Total Bilirubin: 0.6 mg/dL (ref 0.3–1.2)
Total Protein: 6 g/dL — ABNORMAL LOW (ref 6.5–8.1)

## 2020-02-17 LAB — GLUCOSE, CAPILLARY
Glucose-Capillary: 173 mg/dL — ABNORMAL HIGH (ref 70–99)
Glucose-Capillary: 115 mg/dL — ABNORMAL HIGH (ref 70–99)
Glucose-Capillary: 139 mg/dL — ABNORMAL HIGH (ref 70–99)
Glucose-Capillary: 126 mg/dL — ABNORMAL HIGH (ref 70–99)
Glucose-Capillary: 93 mg/dL (ref 70–99)

## 2020-02-17 LAB — BASIC METABOLIC PANEL
Anion gap: 8 (ref 5–15)
BUN: 25 mg/dL — ABNORMAL HIGH (ref 6–20)
CO2: 28 mmol/L (ref 22–32)
Calcium: 10.2 mg/dL (ref 8.9–10.3)
Chloride: 102 mmol/L (ref 98–111)
Creatinine, Ser: 1.24 mg/dL (ref 0.61–1.24)
GFR, Estimated: >60 mL/min (ref >60)
Glucose, Bld: 140 mg/dL — ABNORMAL HIGH (ref 70–99)
Potassium: 4.1 mmol/L (ref 3.5–5.1)
Sodium: 138 mmol/L (ref 135–145)

## 2020-02-17 MED ORDER — SODIUM CHLORIDE 0.9 % IV SOLN: INTRAVENOUS | Status: DC

## 2020-02-17 NOTE — Research (Addendum)
[facility] Informed Consent   Subject Name: KASHEEM TONER  Subject met inclusion and exclusion criteria.  The informed consent form, study requirements and expectations were reviewed with the subject and questions and concerns were addressed prior to the signing of the consent form.  The subject verbalized understanding of the trail requirements.  The subject agreed to participate in the [facility] trial and signed the informed consent.  The informed consent was obtained prior to performance of any protocol-specific procedures for the subject.  A copy of the signed informed consent was given to the subject and a copy was placed in the subject's medical record.  Mena Goes. 02/15/2020, 0915 am

## 2020-02-17 NOTE — Progress Notes (Addendum)
Advanced Heart Failure Rounding Note  PCP-Cardiologist: Quay Burow, MD   Subjective:    Diuresis slowing. Only 2.2L in UOP yesterday (4L the day prior). Wt down an additional 3 lb. BMP pending.  Overall feels better. Still mildly fluid overloaded on exam.      Objective:   Weight Range: 102.8 kg Body mass index is 32.53 kg/m.   Vital Signs:   Temp:  [98.2 F (36.8 C)-98.7 F (37.1 C)] 98.2 F (36.8 C) (12/01 0616) Pulse Rate:  [72-78] 78 (12/01 0616) Resp:  [17-20] 19 (12/01 0616) BP: (99-122)/(65-87) 113/87 (12/01 0616) SpO2:  [95 %-99 %] 95 % (12/01 0616) Weight:  [102.8 kg] 102.8 kg (12/01 0616) Last BM Date: 02/16/20  Weight change: Filed Weights   02/15/20 1630 02/16/20 0139 02/17/20 0616  Weight: 103.8 kg 104.3 kg 102.8 kg    Intake/Output:   Intake/Output Summary (Last 24 hours) at 02/17/2020 1031 Last data filed at 02/17/2020 0844 Gross per 24 hour  Intake 1200 ml  Output 2275 ml  Net -1075 ml      Physical Exam    PHYSICAL EXAM: General:  Well appearing middle aged male, sitting up in chair. No respiratory difficulty HEENT: normal Neck: supple.  JVD ~ 9 cm. Carotids 2+ bilat; no bruits. No lymphadenopathy or thyromegaly appreciated. Cor: PMI nondisplaced. Regular rate & rhythm. No rubs, gallops or murmurs. Lungs: clear Abdomen: soft, nontender, nondistended. No hepatosplenomegaly. No bruits or masses. Good bowel sounds. Extremities: no cyanosis, clubbing, rash, 1+ bilateral LEE + bilateral unna boots Neuro: alert & oriented x 3, cranial nerves grossly intact. moves all 4 extremities w/o difficulty. Affect pleasant.   Telemetry   NSR 70s w/ occasional PVCs   EKG    No new EKG to review   Labs    CBC Recent Labs    02/15/20 1258 02/16/20 0343  WBC  --  7.5  HGB 12.2* 12.9*  HCT 36.0* 40.0  MCV  --  79.4*  PLT  --  161*   Basic Metabolic Panel Recent Labs    02/15/20 1258 02/16/20 0343  NA 138 130*  K 4.1 3.6  CL  --   99  CO2  --  24  GLUCOSE  --  114*  BUN  --  27*  CREATININE  --  1.00  CALCIUM  --  9.6   Liver Function Tests Recent Labs    02/17/20 0050  AST 49*  ALT 60*  ALKPHOS 49  BILITOT 0.6  PROT 6.0*  ALBUMIN 2.7*   No results for input(s): LIPASE, AMYLASE in the last 72 hours. Cardiac Enzymes No results for input(s): CKTOTAL, CKMB, CKMBINDEX, TROPONINI in the last 72 hours.  BNP: BNP (last 3 results) No results for input(s): BNP in the last 8760 hours.  ProBNP (last 3 results) No results for input(s): PROBNP in the last 8760 hours.   D-Dimer No results for input(s): DDIMER in the last 72 hours. Hemoglobin A1C No results for input(s): HGBA1C in the last 72 hours. Fasting Lipid Panel No results for input(s): CHOL, HDL, LDLCALC, TRIG, CHOLHDL, LDLDIRECT in the last 72 hours. Thyroid Function Tests No results for input(s): TSH, T4TOTAL, T3FREE, THYROIDAB in the last 72 hours.  Invalid input(s): FREET3  Other results:   Imaging    No results found.   Medications:     Scheduled Medications: . aspirin  81 mg Oral Daily  . clopidogrel  75 mg Oral Q breakfast  . fenofibrate  160  mg Oral Daily  . furosemide  80 mg Intravenous Q12H  . icosapent Ethyl  2 g Oral BID  . insulin pump   Subcutaneous TID WC, HS, 0200  . isosorbide mononitrate  30 mg Oral Daily  . losartan  25 mg Oral Daily  . metoprolol succinate  75 mg Oral BID  . sodium chloride flush  3 mL Intravenous Q12H  . sodium chloride flush  3 mL Intravenous Q12H    Infusions: . sodium chloride      PRN Medications: sodium chloride, acetaminophen, morphine injection, ondansetron (ZOFRAN) IV, polyvinyl alcohol, sodium chloride flush   Assessment/Plan    1. Acute on Chronic Systolic Heart Failure - Echo 2017 showed normal LVEF 60-65% - Echo 4/21, EF mildly reduced 45%. NST showed scar but no ischemia - Echo 11/21, EF 23%, RV systolic function low normal  - Complains of several month history of  progressive dyspnea - LHC this showed progression of native vessel CAD and grafts.All vein grafts are functionally occluded with minimal left to right collaterals. His LIMA is patent although his LAD is diffusely diseased (diabetic). No targets for intervention and not a candidate for redo CABG. Plan is for medical therapy. - RHC showed mildly elevated filling pressures, PCWP 19. LVEDP 23. CO by Fick 3.25, CI 1.48, by thermo calculation CO 7, CI 3.2. - Good response to IV Lasix but remains mildly fluid overloaded. BMP pending  - Continue IV Lasix 80 mg bid through today, switch to back to PO diuretics tomorrow  - Continue w/ unna boots - Now off ACEi (altace) last dose 11/30. Would benefit from Entresto (EF <57%). Bridge w/ Losartan for BP coverage. Can start Entresto in the AM.    2. CAD - s/p CABG in 2014, followed by PCI to [facility] in 2018 - LHC this admit showed progression of native vessel CAD and grafts.All vein grafts are functionally occluded with minimal left to right collaterals. His LIMA is patent although his LAD is diffusely diseased (diabetic). No targets for intervention and not a candidate for redo CABG. Plan is for medical therapy. - Cont ASA, Plavix, LA nitrate + ? blocker. Refusing statin   3. Mitral Regurgitation  - mild-mod MR noted on recent TTE 11/21 but ? If more severe  - concern may be contributing to recent symptoms - Plan TEE Thursday to better define the valve  - if severe, may be candidate for MitraClip   4. IDDM - has insulin pump   5. Severe Hypertriglyceridemia  - has been as high as 6,000 - likely familial but he does not know family history (adopted).  - has had multiple occurences of pancreatitis. Denies ETOH use   - on Vascepa and fenofibrate as outpatient. Continue   - denies statin, defer further management to Dr. Brendolyn Patty of Stay: 2  Juan Jester, PA-C  02/17/2020, 10:31 AM  Advanced Heart Failure Team Pager [phone]  (M-F; 7a - 4p)  Please contact Juan [name] for night-coverage after hours (4p -7a ) and weekends on [email or web address]  Patient seen and examined with the above-signed Advanced Practice Provider and/or Housestaff. I personally reviewed laboratory data, imaging studies and relevant notes. I independently examined the patient and formulated the important aspects of the plan. I have edited the note to reflect any of my changes or salient points. I have personally discussed the plan with the patient and/or family.  Continues to diurese well. Volume status looks much better. No CP. Renal function stable.  Tolerating losartan well.   General: Sitting up on side of bed No resp difficulty HEENT: normal Neck: supple. JVP 7  Carotids 2+ bilat; no bruits. No lymphadenopathy or thryomegaly appreciated. Cor: PMI nondisplaced. Regular rate & rhythm. Soft MR Lungs: clear Abdomen: soft, nontender, nondistended. No hepatosplenomegaly. No bruits or masses. Good bowel sounds. Extremities: no cyanosis, clubbing, rash, tr edema + Unna  Neuro: alert & orientedx3, cranial nerves grossly intact. moves all 4 extremities w/o difficulty. Affect pleasant  Nearing euvolemia with IV lasix. Will switch to po diuretics tomorrow (would consider torsemide 40 daily). Also switch to [facility], TEE tomorrow to look at MV.   Juan Bickers, MD  6:08 PM

## 2020-02-17 NOTE — Telephone Encounter (Signed)
Forms received on 11/29, given to [facility] Nurse, forms received back from provider on 12/1, faxedand scanned into chart on 12/1.

## 2020-02-17 NOTE — H&P (View-Only) (Signed)
Advanced Heart Failure Rounding Note  PCP-Cardiologist: Quay Burow, MD   Subjective:    Diuresis slowing. Only 2.2L in UOP yesterday (4L the day prior). Wt down an additional 3 lb. BMP pending.  Overall feels better. Still mildly fluid overloaded on exam.      Objective:   Weight Range: 102.8 kg Body mass index is 32.53 kg/m.   Vital Signs:   Temp:  [98.2 F (36.8 C)-98.7 F (37.1 C)] 98.2 F (36.8 C) (12/01 0616) Pulse Rate:  [72-78] 78 (12/01 0616) Resp:  [17-20] 19 (12/01 0616) BP: (99-122)/(65-87) 113/87 (12/01 0616) SpO2:  [95 %-99 %] 95 % (12/01 0616) Weight:  [102.8 kg] 102.8 kg (12/01 0616) Last BM Date: 02/16/20  Weight change: Filed Weights   02/15/20 1630 02/16/20 0139 02/17/20 0616  Weight: 103.8 kg 104.3 kg 102.8 kg    Intake/Output:   Intake/Output Summary (Last 24 hours) at 02/17/2020 1031 Last data filed at 02/17/2020 0844 Gross per 24 hour  Intake 1200 ml  Output 2275 ml  Net -1075 ml      Physical Exam    PHYSICAL EXAM: General:  Well appearing middle aged male, sitting up in chair. No respiratory difficulty HEENT: normal Neck: supple.  JVD ~ 9 cm. Carotids 2+ bilat; no bruits. No lymphadenopathy or thyromegaly appreciated. Cor: PMI nondisplaced. Regular rate & rhythm. No rubs, gallops or murmurs. Lungs: clear Abdomen: soft, nontender, nondistended. No hepatosplenomegaly. No bruits or masses. Good bowel sounds. Extremities: no cyanosis, clubbing, rash, 1+ bilateral LEE + bilateral unna boots Neuro: alert & oriented x 3, cranial nerves grossly intact. moves all 4 extremities w/o difficulty. Affect pleasant.   Telemetry   NSR 70s w/ occasional PVCs   EKG    No new EKG to review   Labs    CBC Recent Labs    02/15/20 1258 02/16/20 0343  WBC  --  7.5  HGB 12.2* 12.9*  HCT 36.0* 40.0  MCV  --  79.4*  PLT  --  716*   Basic Metabolic Panel Recent Labs    02/15/20 1258 02/16/20 0343  NA 138 130*  K 4.1 3.6  CL  --   99  CO2  --  24  GLUCOSE  --  114*  BUN  --  27*  CREATININE  --  1.00  CALCIUM  --  9.6   Liver Function Tests Recent Labs    02/17/20 0050  AST 49*  ALT 60*  ALKPHOS 49  BILITOT 0.6  PROT 6.0*  ALBUMIN 2.7*   No results for input(s): LIPASE, AMYLASE in the last 72 hours. Cardiac Enzymes No results for input(s): CKTOTAL, CKMB, CKMBINDEX, TROPONINI in the last 72 hours.  BNP: BNP (last 3 results) No results for input(s): BNP in the last 8760 hours.  ProBNP (last 3 results) No results for input(s): PROBNP in the last 8760 hours.   D-Dimer No results for input(s): DDIMER in the last 72 hours. Hemoglobin A1C No results for input(s): HGBA1C in the last 72 hours. Fasting Lipid Panel No results for input(s): CHOL, HDL, LDLCALC, TRIG, CHOLHDL, LDLDIRECT in the last 72 hours. Thyroid Function Tests No results for input(s): TSH, T4TOTAL, T3FREE, THYROIDAB in the last 72 hours.  Invalid input(s): FREET3  Other results:   Imaging    No results found.   Medications:     Scheduled Medications: . aspirin  81 mg Oral Daily  . clopidogrel  75 mg Oral Q breakfast  . fenofibrate  160  mg Oral Daily  . furosemide  80 mg Intravenous Q12H  . icosapent Ethyl  2 g Oral BID  . insulin pump   Subcutaneous TID WC, HS, 0200  . isosorbide mononitrate  30 mg Oral Daily  . losartan  25 mg Oral Daily  . metoprolol succinate  75 mg Oral BID  . sodium chloride flush  3 mL Intravenous Q12H  . sodium chloride flush  3 mL Intravenous Q12H    Infusions: . sodium chloride      PRN Medications: sodium chloride, acetaminophen, morphine injection, ondansetron (ZOFRAN) IV, polyvinyl alcohol, sodium chloride flush   Assessment/Plan    1. Acute on Chronic Systolic Heart Failure - Echo 2017 showed normal LVEF 60-65% - Echo 4/21, EF mildly reduced 45%. NST showed scar but no ischemia - Echo 11/21, EF 63%, RV systolic function low normal  - Complains of several month history of  progressive dyspnea - LHC this showed progression of native vessel CAD and grafts.All vein grafts are functionally occluded with minimal left to right collaterals. His LIMA is patent although his LAD is diffusely diseased (diabetic). No targets for intervention and not a candidate for redo CABG. Plan is for medical therapy. - RHC showed mildly elevated filling pressures, PCWP 19. LVEDP 23. CO by Fick 3.25, CI 1.48, by thermo calculation CO 7, CI 3.2. - Good response to IV Lasix but remains mildly fluid overloaded. BMP pending  - Continue IV Lasix 80 mg bid through today, switch to back to PO diuretics tomorrow  - Continue w/ unna boots - Now off ACEi (altace) last dose 11/30. Would benefit from Entresto (EF <57%). Bridge w/ Losartan for BP coverage. Can start Entresto in the AM.    2. CAD - s/p CABG in 2014, followed by PCI to [facility] in 2018 - LHC this admit showed progression of native vessel CAD and grafts.All vein grafts are functionally occluded with minimal left to right collaterals. His LIMA is patent although his LAD is diffusely diseased (diabetic). No targets for intervention and not a candidate for redo CABG. Plan is for medical therapy. - Cont ASA, Plavix, LA nitrate + ? blocker. Refusing statin   3. Mitral Regurgitation  - mild-mod MR noted on recent TTE 11/21 but ? If more severe  - concern may be contributing to recent symptoms - Plan TEE Thursday to better define the valve  - if severe, may be candidate for MitraClip   4. IDDM - has insulin pump   5. Severe Hypertriglyceridemia  - has been as high as 6,000 - likely familial but he does not know family history (adopted).  - has had multiple occurences of pancreatitis. Denies ETOH use   - on Vascepa and fenofibrate as outpatient. Continue   - denies statin, defer further management to Dr. Brendolyn Patty of Stay: 2  Lyda Jester, PA-C  02/17/2020, 10:31 AM  Advanced Heart Failure Team Pager [phone]  (M-F; 7a - 4p)  Please contact [facility] for night-coverage after hours (4p -7a ) and weekends on [email or web address]  Patient seen and examined with the above-signed Advanced Practice Provider and/or Housestaff. I personally reviewed laboratory data, imaging studies and relevant notes. I independently examined the patient and formulated the important aspects of the plan. I have edited the note to reflect any of my changes or salient points. I have personally discussed the plan with the patient and/or family.  Continues to diurese well. Volume status looks much better. No CP. Renal function stable.  Tolerating losartan well.   General: Sitting up on side of bed No resp difficulty HEENT: normal Neck: supple. JVP 7  Carotids 2+ bilat; no bruits. No lymphadenopathy or thryomegaly appreciated. Cor: PMI nondisplaced. Regular rate & rhythm. Soft MR Lungs: clear Abdomen: soft, nontender, nondistended. No hepatosplenomegaly. No bruits or masses. Good bowel sounds. Extremities: no cyanosis, clubbing, rash, tr edema + Unna  Neuro: alert & orientedx3, cranial nerves grossly intact. moves all 4 extremities w/o difficulty. Affect pleasant  Nearing euvolemia with IV lasix. Will switch to po diuretics tomorrow (would consider torsemide 40 daily). Also switch to [facility], TEE tomorrow to look at MV.   Glori Bickers, MD  6:08 PM

## 2020-02-18 ENCOUNTER — Inpatient Hospital Stay (HOSPITAL_COMMUNITY): Payer: Commercial Managed Care - PPO

## 2020-02-18 ENCOUNTER — Telehealth: Payer: Self-pay | Admitting: Cardiovascular Disease

## 2020-02-18 ENCOUNTER — Inpatient Hospital Stay (HOSPITAL_COMMUNITY): Payer: Commercial Managed Care - PPO | Admitting: Certified Registered Nurse Anesthetist

## 2020-02-18 ENCOUNTER — Encounter (HOSPITAL_COMMUNITY)
Admission: RE | Disposition: A | Discharge status: Home / Self Care | Payer: Self-pay | Source: Home / Self Care | Attending: Cardiovascular Disease

## 2020-02-18 ENCOUNTER — Encounter (HOSPITAL_COMMUNITY): Payer: Self-pay | Admitting: Cardiovascular Disease

## 2020-02-18 DIAGNOSIS — I5043 Acute on chronic combined systolic (congestive) and diastolic (congestive) heart failure: Secondary | ICD-10-CM | POA: Diagnosis not present

## 2020-02-18 DIAGNOSIS — I340 Nonrheumatic mitral (valve) insufficiency: Secondary | ICD-10-CM

## 2020-02-18 HISTORY — PX: TEE WITHOUT CARDIOVERSION: SHX5443

## 2020-02-18 LAB — GLUCOSE, CAPILLARY
Glucose-Capillary: 210 mg/dL — ABNORMAL HIGH (ref 70–99)
Glucose-Capillary: 156 mg/dL — ABNORMAL HIGH (ref 70–99)
Glucose-Capillary: 169 mg/dL — ABNORMAL HIGH (ref 70–99)

## 2020-02-18 LAB — BASIC METABOLIC PANEL
Anion gap: 11 (ref 5–15)
BUN: 26 mg/dL — ABNORMAL HIGH (ref 6–20)
CO2: 25 mmol/L (ref 22–32)
Calcium: 10 mg/dL (ref 8.9–10.3)
Chloride: 100 mmol/L (ref 98–111)
Creatinine, Ser: 1.34 mg/dL — ABNORMAL HIGH (ref 0.61–1.24)
GFR, Estimated: >60 mL/min (ref >60)
Glucose, Bld: 166 mg/dL — ABNORMAL HIGH (ref 70–99)
Potassium: 3.9 mmol/L (ref 3.5–5.1)
Sodium: 136 mmol/L (ref 135–145)

## 2020-02-18 SURGERY — ECHOCARDIOGRAM, TRANSESOPHAGEAL
Anesthesia: Monitor Anesthesia Care

## 2020-02-18 MED ORDER — SODIUM CHLORIDE 0.9 % IV SOLN: INTRAVENOUS | Status: DC | PRN

## 2020-02-18 MED ORDER — PROPOFOL 10 MG/ML IV BOLUS
INTRAVENOUS | Status: DC | PRN
  Administered 2020-02-18 (×4): 10 mg via INTRAVENOUS

## 2020-02-18 MED ORDER — TORSEMIDE 20 MG PO TABS: 40.0000 mg | ORAL_TABLET | Freq: Every day | ORAL | 5 refills | Status: DC

## 2020-02-18 MED ORDER — PROPOFOL 500 MG/50ML IV EMUL
INTRAVENOUS | Status: DC | PRN
  Administered 2020-02-18: 100 ug/kg/min via INTRAVENOUS

## 2020-02-18 MED ORDER — SACUBITRIL-VALSARTAN 24-26 MG PO TABS: 1.0000 | ORAL_TABLET | Freq: Two times a day (BID) | ORAL | Status: DC

## 2020-02-18 MED ORDER — PHENYLEPHRINE 40 MCG/ML (10ML) SYRINGE FOR IV PUSH (FOR BLOOD PRESSURE SUPPORT)
PREFILLED_SYRINGE | INTRAVENOUS | Status: DC | PRN
  Administered 2020-02-18: 80 ug via INTRAVENOUS

## 2020-02-18 MED ORDER — SACUBITRIL-VALSARTAN 24-26 MG PO TABS: 1.0000 | ORAL_TABLET | Freq: Two times a day (BID) | ORAL | 5 refills | Status: DC

## 2020-02-18 NOTE — Progress Notes (Addendum)
Advanced Heart Failure Rounding Note  PCP-Cardiologist: Quay Burow, MD   Subjective:    Has diuresed well w/ IV Lasix. I/Os net negative 5L. Slight bump in SCr 1.0>>1.24>>1.34.   TEE today showed EF 30-35%, mod-severe RV dysfunction. Very mild MR.   He feels well. Ready to go home today.        Objective:   Weight Range: 102.5 kg Body mass index is 32.43 kg/m.   Vital Signs:   Temp:  [97.7 F (36.5 C)-98.6 F (37 C)] 97.7 F (36.5 C) (12/02 0850) Pulse Rate:  [70-87] 72 (12/02 0915) Resp:  [15-25] 15 (12/02 0915) BP: (101-147)/(66-86) 111/75 (12/02 0915) SpO2:  [96 %-100 %] 98 % (12/02 0915) Weight:  [102.5 kg-102.6 kg] 102.5 kg (12/02 0749) Last BM Date: 02/17/20  Weight change: Filed Weights   02/17/20 0616 02/18/20 0647 02/18/20 0749  Weight: 102.8 kg 102.6 kg 102.5 kg    Intake/Output:   Intake/Output Summary (Last 24 hours) at 02/18/2020 1016 Last data filed at 02/18/2020 0823 Gross per 24 hour  Intake 480.86 ml  Output 2400 ml  Net -1919.14 ml      Physical Exam    PHYSICAL EXAM: General:  Well appearing. No respiratory difficulty HEENT: normal Neck: supple. no JVD. Carotids 2+ bilat; no bruits. No lymphadenopathy or thyromegaly appreciated. Cor: PMI nondisplaced. Regular rate & rhythm. No rubs, gallops or murmurs. Lungs: clear Abdomen: soft, nontender, nondistended. No hepatosplenomegaly. No bruits or masses. Good bowel sounds. Extremities: no cyanosis, clubbing, rash, edema Neuro: alert & oriented x 3, cranial nerves grossly intact. moves all 4 extremities w/o difficulty. Affect pleasant.    Telemetry   NSR 70s   EKG    No new EKG to review   Labs    CBC Recent Labs    02/15/20 1258 02/16/20 0343  WBC  --  7.5  HGB 12.2* 12.9*  HCT 36.0* 40.0  MCV  --  79.4*  PLT  --  696*   Basic Metabolic Panel Recent Labs    02/17/20 1036 02/18/20 0522  NA 138 136  K 4.1 3.9  CL 102 100  CO2 28 25  GLUCOSE 140* 166*  BUN  25* 26*  CREATININE 1.24 1.34*  CALCIUM 10.2 10.0   Liver Function Tests Recent Labs    02/17/20 0050  AST 49*  ALT 60*  ALKPHOS 49  BILITOT 0.6  PROT 6.0*  ALBUMIN 2.7*   No results for input(s): LIPASE, AMYLASE in the last 72 hours. Cardiac Enzymes No results for input(s): CKTOTAL, CKMB, CKMBINDEX, TROPONINI in the last 72 hours.  BNP: BNP (last 3 results) No results for input(s): BNP in the last 8760 hours.  ProBNP (last 3 results) No results for input(s): PROBNP in the last 8760 hours.   D-Dimer No results for input(s): DDIMER in the last 72 hours. Hemoglobin A1C No results for input(s): HGBA1C in the last 72 hours. Fasting Lipid Panel No results for input(s): CHOL, HDL, LDLCALC, TRIG, CHOLHDL, LDLDIRECT in the last 72 hours. Thyroid Function Tests No results for input(s): TSH, T4TOTAL, T3FREE, THYROIDAB in the last 72 hours.  Invalid input(s): FREET3  Other results:   Imaging    No results found.   Medications:     Scheduled Medications: . aspirin  81 mg Oral Daily  . clopidogrel  75 mg Oral Q breakfast  . fenofibrate  160 mg Oral Daily  . furosemide  80 mg Intravenous Q12H  . icosapent Ethyl  2 g  Oral BID  . insulin pump   Subcutaneous TID WC, HS, 0200  . isosorbide mononitrate  30 mg Oral Daily  . losartan  25 mg Oral Daily  . metoprolol succinate  75 mg Oral BID  . sodium chloride flush  3 mL Intravenous Q12H  . sodium chloride flush  3 mL Intravenous Q12H    Infusions: . sodium chloride      PRN Medications: sodium chloride, acetaminophen, morphine injection, ondansetron (ZOFRAN) IV, polyvinyl alcohol, sodium chloride flush   Assessment/Plan    1. Acute on Chronic Systolic Heart Failure - Echo 2017 showed normal LVEF 60-65% - Echo 4/21, EF mildly reduced 45%. NST showed scar but no ischemia - Echo 11/21, EF 26%, RV systolic function low normal  - Complains of several month history of progressive dyspnea - LHC this showed  progression of native vessel CAD and grafts.All vein grafts are functionally occluded with minimal left to right collaterals. His LIMA is patent although his LAD is diffusely diseased (diabetic). No targets for intervention and not a candidate for redo CABG. Plan is for medical therapy. - RHC showed mildly elevated filling pressures, PCWP 19. LVEDP 23. CO by Fick 3.25, CI 1.48, by thermo calculation CO 7, CI 3.2. Admitted for IV diuresis  - Good response to IV Lasix and volume status improved.  - Transition back to PO diuretics today, Torsemide 40 mg once daily (previously on lasix 40 mg bid) - Start Entresto 24-26 mg bid today, (he has had 36 hr ACEi washout)    2. CAD - s/p CABG in 2014, followed by PCI to [facility] in 2018 - LHC this admit showed progression of native vessel CAD and grafts.All vein grafts are functionally occluded with minimal left to right collaterals. His LIMA is patent although his LAD is diffusely diseased (diabetic). No targets for intervention and not a candidate for redo CABG. Plan is for medical therapy. - Cont ASA, Plavix, LA nitrate + ? blocker. Refusing statin   3. Mitral Regurgitation  - mild-mod MR noted on recent TTE 11/21 - there were initial concerns that his MR could have been more severe and contributing to symptoms, however TEE today showed only very mild MR   4. IDDM - has insulin pump   5. Severe Hypertriglyceridemia  - has been as high as 6,000 - likely familial but he does not know family history (adopted).  - has had multiple occurences of pancreatitis. Denies ETOH use   - on Vascepa and fenofibrate as outpatient. Continue   - denies statin, defer further management to Dr. Gwenlyn Found   Plan d/c Home today. We will arrange f/u in our clinic for HF. Dr. Gwenlyn Found will continue to see for CAD and HLD.    Length of Stay: [address] PA-C  02/18/2020, 10:16 AM  Advanced Heart Failure Team Pager [phone] (M-F; 7a - 4p)  Please contact  [facility] for night-coverage after hours (4p -7a ) and weekends on [email or web address]   Patient seen and examined with the above-signed Advanced Practice Provider and/or Housestaff. I personally reviewed laboratory data, imaging studies and relevant notes. I independently examined the patient and formulated the important aspects of the plan. I have edited the note to reflect any of my changes or salient points. I have personally discussed the plan with the patient and/or family.  Much improved. Volume status looks better. TEE today with EF 30-35% Mild MR and moderate to severe RV dysfunction   General:  Well appearing. No resp  difficulty HEENT: normal Neck: supple. no JVD. Carotids 2+ bilat; no bruits. No lymphadenopathy or thryomegaly appreciated. Cor: PMI nondisplaced. Regular rate & rhythm. No rubs, gallops or murmurs. Lungs: clear Abdomen: soft, nontender, nondistended. No hepatosplenomegaly. No bruits or masses. Good bowel sounds. Extremities: no cyanosis, clubbing, rash, tr edema Neuro: alert & orientedx3, cranial nerves grossly intact. moves all 4 extremities w/o difficulty. Affect pleasant  Will d/c today. Med changes as above. F/u with HF Clinic and Dr. Gwenlyn Found.   Glori Bickers, MD  2:22 PM

## 2020-02-18 NOTE — Telephone Encounter (Signed)
Pt c/o medication issue:  1. Name of Medication: sacubitril-valsartan (ENTRESTO) 24-26 MG  2. How are you currently taking this medication (dosage and times per day)? As directed   3. Are you having a reaction (difficulty breathing--STAT)?  No reaction   4. What is your medication issue?  Insurance will not cover this medication, patient was told while at the hospital to call our office if insurance doesn't cover to find out if there is something that we can help. Right now it will cost them almost $700 a month. Patient has a 30 day free trial pack currently

## 2020-02-18 NOTE — Anesthesia Postprocedure Evaluation (Signed)
Anesthesia Post Note  Patient: Juan [name]  Procedure(s) Performed: TRANSESOPHAGEAL ECHOCARDIOGRAM (TEE) (N/A )     Patient location during evaluation: Endoscopy Anesthesia Type: MAC Level of consciousness: awake and alert Pain management: pain level controlled Vital Signs Assessment: post-procedure vital signs reviewed and stable Respiratory status: spontaneous breathing, nonlabored ventilation, respiratory function stable and patient connected to nasal cannula oxygen Cardiovascular status: stable and blood pressure returned to baseline Postop Assessment: no apparent nausea or vomiting Anesthetic complications: no   No complications documented.  Last Vitals:  Vitals:   02/18/20 0910 02/18/20 0915  BP: 106/79 111/75  Pulse: 71 72  Resp: (!) 22 15  Temp:    SpO2: 97% 98%    Last Pain:  Vitals:   02/18/20 0950  TempSrc:   PainSc: 0-No pain                 Syla Devoss

## 2020-02-18 NOTE — Care Plan (Signed)
February 17, 2020   Patient: Juan [name]  Date of Birth: 1966-01-29  Date of Visit: 02/09/2020    To Whom It May Concern:  It is my medical opinion that Hau Sanor should remain off of work until cleared by Dr. Gwenlyn Found on March 01, 2020.  If you have any questions or concerns, please don't hesitate to call.  Sincerely,    Carlene Coria, NP

## 2020-02-18 NOTE — CV Procedure (Signed)
    TRANSESOPHAGEAL ECHOCARDIOGRAM   NAME:  Juan [name]   MRN: [id number] DOB:  02/06/1966   ADMIT DATE: 02/15/2020  INDICATIONS:  Mitral regurgitation  PROCEDURE:   Informed consent was obtained prior to the procedure. The risks, benefits and alternatives for the procedure were discussed and the patient comprehended these risks.  Risks include, but are not limited to, cough, sore throat, vomiting, nausea, somnolence, esophageal and stomach trauma or perforation, bleeding, low blood pressure, aspiration, pneumonia, infection, trauma to the teeth and death.    After a procedural time-out, the patient was sedated by the anesthesia service. The transesophageal probe was inserted in the esophagus and stomach without difficulty and multiple views were obtained.    COMPLICATIONS:    There were no immediate complications.  FINDINGS:  LEFT VENTRICLE: EF = 30-35%. Global HK  RIGHT VENTRICLE: Moderate to severe HK  LEFT ATRIUM: Moderately dialted  LEFT ATRIAL APPENDAGE: No thrombus.   RIGHT ATRIUM: Mildly dilated  AORTIC VALVE:  Trileaflet. No AI/AS  MITRAL VALVE:    Normal. Mild MR  TRICUSPID VALVE: Normal. Mild TR  PULMONIC VALVE: Grossly normal. Triv PR  INTERATRIAL SEPTUM: No PFO or ASD.  PERICARDIUM: No effusion  DESCENDING AORTA: Mild plaque    Benay Spice 8:46 AM

## 2020-02-18 NOTE — Telephone Encounter (Signed)
Returned call to wife she states that [facility] 24-26 was started while pt was in the hospital(pt is currently in the hospital-[place]). They were told that this is not a covered medication thru the insurance. They did receive a free 30 day card so he will continue taking these. Will forward to Dr Kennon Holter RN and will follow next steps. Also, wife states that pt's Ramipril was stopped while in the hospital.

## 2020-02-18 NOTE — Anesthesia Procedure Notes (Signed)
Procedure Name: MAC Date/Time: 02/18/2020 8:20 AM Performed by: Harden Mo, CRNA Pre-anesthesia Checklist: Patient identified, Emergency Drugs available, Suction available and Patient being monitored Patient Re-evaluated:Patient Re-evaluated prior to induction Oxygen Delivery Method: Nasal cannula Preoxygenation: Pre-oxygenation with 100% oxygen Induction Type: IV induction Placement Confirmation: positive ETCO2 and breath sounds checked- equal and bilateral Dental Injury: Teeth and Oropharynx as per pre-operative assessment

## 2020-02-18 NOTE — Plan of Care (Signed)
  Problem: Education: Goal: Understanding of CV disease, CV risk reduction, and recovery process will improve Outcome: Adequate for Discharge Goal: Individualized Educational Video(s) Outcome: Adequate for Discharge   Problem: Activity: Goal: Ability to return to baseline activity level will improve Outcome: Adequate for Discharge   Problem: Cardiovascular: Goal: Ability to achieve and maintain adequate cardiovascular perfusion will improve Outcome: Adequate for Discharge Goal: Vascular access site(s) Level 0-1 will be maintained Outcome: Adequate for Discharge   Problem: Health Behavior/Discharge Planning: Goal: Ability to safely manage health-related needs after discharge will improve Outcome: Adequate for Discharge   Problem: Education: Goal: Ability to demonstrate management of disease process will improve Outcome: Adequate for Discharge Goal: Ability to verbalize understanding of medication therapies will improve Outcome: Adequate for Discharge Goal: Individualized Educational Video(s) Outcome: Adequate for Discharge   Problem: Activity: Goal: Capacity to carry out activities will improve Outcome: Adequate for Discharge   Problem: Cardiac: Goal: Ability to achieve and maintain adequate cardiopulmonary perfusion will improve Outcome: Adequate for Discharge   Problem: Education: Goal: Knowledge of General Education information will improve Description: Including pain rating scale, medication(s)/side effects and non-pharmacologic comfort measures Outcome: Adequate for Discharge   Problem: Health Behavior/Discharge Planning: Goal: Ability to manage health-related needs will improve Outcome: Adequate for Discharge   Problem: Clinical Measurements: Goal: Ability to maintain clinical measurements within normal limits will improve Outcome: Adequate for Discharge Goal: Will remain free from infection Outcome: Adequate for Discharge Goal: Diagnostic test results will  improve Outcome: Adequate for Discharge Goal: Respiratory complications will improve Outcome: Adequate for Discharge Goal: Cardiovascular complication will be avoided Outcome: Adequate for Discharge   Problem: Activity: Goal: Risk for activity intolerance will decrease Outcome: Adequate for Discharge   Problem: Nutrition: Goal: Adequate nutrition will be maintained Outcome: Adequate for Discharge   Problem: Coping: Goal: Level of anxiety will decrease Outcome: Adequate for Discharge   Problem: Elimination: Goal: Will not experience complications related to bowel motility Outcome: Adequate for Discharge Goal: Will not experience complications related to urinary retention Outcome: Adequate for Discharge   Problem: Pain Managment: Goal: General experience of comfort will improve Outcome: Adequate for Discharge   Problem: Safety: Goal: Ability to remain free from injury will improve Outcome: Adequate for Discharge   Problem: Skin Integrity: Goal: Risk for impaired skin integrity will decrease Outcome: Adequate for Discharge

## 2020-02-18 NOTE — Anesthesia Preprocedure Evaluation (Addendum)
Anesthesia Evaluation  Patient identified by MRN, date of birth, ID band Patient awake    Reviewed: Allergy & Precautions, NPO status , Patient's Chart, lab work & pertinent test results  History of Anesthesia Complications Negative for: history of anesthetic complications  Airway Mallampati: II  TM Distance: >3 FB Neck ROM: Full    Dental  (+) Dental Advisory Given   Pulmonary neg pulmonary ROS,  Covid-19 Nucleic Acid Test Results Lab Results      Component                Value               Date                      SARSCOV2NAA              NEGATIVE            02/12/2020                [place]              NEGATIVE            05/21/2019              breath sounds clear to auscultation       Cardiovascular hypertension, + CAD, + CABG and +CHF  + dysrhythmias  Rhythm:Regular  1. Left ventricular ejection fraction, by estimation, is 45%. The left  ventricle has mildly decreased function. There is mild left ventricular  hypertrophy. Indeterminate diastolic filling due to E-A fusion.  2. Right ventricular systolic function is low normal. The right  ventricular size is normal.  3. Left atrial size was mildly dilated.  4. The mitral valve is abnormal. Mild to moderate mitral valve  regurgitation.  5. The aortic valve is normal in structure. Aortic valve regurgitation is  not visualized.  6. The inferior vena cava is normal in size with greater than 50%  respiratory variability, suggesting right atrial pressure of 3 mmHg.    S/P CABG x 4, 02/05/13 LIMA-LAD; LT. RADIAL-OM;VG-DIAG; VG-PDA  10/18 3/4 patent grafts (occluded SVG-->Diag), PCI/DESx 3 SVG-->RCA, normal EF   Neuro/Psych neg Seizures  Neuromuscular disease negative psych ROS   GI/Hepatic   Endo/Other  diabetes, Type 2, Insulin DependentLab Results      Component                Value               Date                      HGBA1C                   8.5 (H)              05/21/2019             Renal/GU Renal diseaseLab Results      Component                Value               Date                      CREATININE               1.34 (H)            02/18/2020  Musculoskeletal negative musculoskeletal ROS (+)   Abdominal   Peds  Hematology  (+) Blood dyscrasia, anemia , Non Hodgkin's lymphoma (HCC)  Lab Results      Component                Value               Date                      WBC                      7.5                 02/16/2020                HGB                      12.9 (L)            02/16/2020                HCT                      40.0                02/16/2020                MCV                      79.4 (L)            02/16/2020                PLT                      100 (L)             02/16/2020              Anesthesia Other Findings   Reproductive/Obstetrics                           Anesthesia Physical Anesthesia Plan  ASA: III  Anesthesia Plan: MAC   Post-op Pain Management:    Induction: Intravenous  PONV Risk Score and Plan: 1 and Propofol infusion  Airway Management Planned: Nasal Cannula  Additional Equipment: None  Intra-op Plan:   Post-operative Plan:   Informed Consent: I have reviewed the patients History and Physical, chart, labs and discussed the procedure including the risks, benefits and alternatives for the proposed anesthesia with the patient or authorized representative who has indicated his/her understanding and acceptance.     Dental advisory given  Plan Discussed with: CRNA and Surgeon  Anesthesia Plan Comments:         Anesthesia Quick Evaluation

## 2020-02-18 NOTE — Progress Notes (Signed)
D/C instructions given and reviewed. Questions asked and answered. Tele and IV removed, tolerated well. 

## 2020-02-18 NOTE — Transfer of Care (Signed)
Immediate Anesthesia Transfer of Care Note  Patient: Juan [name]  Procedure(s) Performed: TRANSESOPHAGEAL ECHOCARDIOGRAM (TEE) (N/A )  Patient Location: Endoscopy Unit  Anesthesia Type:MAC  Level of Consciousness: awake and drowsy  Airway & Oxygen Therapy: Patient Spontanous Breathing  Post-op Assessment: Report given to RN and Post -op Vital signs reviewed and stable  Post vital signs: Reviewed and stable  Last Vitals:  Vitals Value Taken Time  BP 101/66 02/18/20 0850  Temp 36.5 C 02/18/20 0850  Pulse 71 02/18/20 0850  Resp 25 02/18/20 0850  SpO2 96 % 02/18/20 0850    Last Pain:  Vitals:   02/18/20 0850  TempSrc: Temporal  PainSc: 0-No pain      Patients Stated Pain Goal: 0 (88/28/00 3491)  Complications: No complications documented.

## 2020-02-18 NOTE — TOC Transition Note (Signed)
Transition of Care [facility]) - CM/SW Discharge Note   Patient Details  Name: Juan [name] MRN: [id number] Date of Birth: 1965-08-16  [facility]) CM/SW Contact:  Zenon Mayo, RN Phone Number: 02/18/2020, 12:19 PM   Clinical Narrative:    Patient is for dc today, patient is on entresto 24 to 26, benefit check sent in.  Patient states pharmacy called him and said entresto was 600.00, NCM contacted pharmacy gave 30 day free coupon info and the patient can pick up the entresto for 30 day supply, but the entresto will need prior auth per pharmacy.  NCM informed Brittainy PA ,she states her team is working on getting prior auth for patient refills. Per [place] PA patient can use Ted hose for his legs.  Patient states he has ted hose at home.    Final next level of care: Home/Self Care Barriers to Discharge: No Barriers Identified   Patient Goals and CMS Choice Patient states their goals for this hospitalization and ongoing recovery are:: get better      Discharge Placement                       Discharge Plan and Services                                     Social Determinants of Health (SDOH) Interventions     Readmission Risk Interventions No flowsheet data found.

## 2020-02-18 NOTE — Discharge Summary (Addendum)
Advanced Heart Failure Team  Discharge Summary   Patient ID: Juan [name] MRN: [id number], DOB/AGE: [birth year] 54 y.o. Admit date: 02/15/2020 D/C date:     02/18/2020   Primary Discharge Diagnoses:   1. Acute on Chronic Systolic Heart Failure 2. CAD 3. Mitral Regurgitation  4. IDDM 5. Severe Hypertriglyceridemia    Hospital Course: Juan [name] is 54 y/o male w/ h/o systolic HF, EF 32% on most recent echo, 3VCAD s/p CABG in 2014 followed by stenting to SVG-RCA in 2018, IDDM, severe hypertriglyceridemia w/ subsequent episodes of pancreatitis. Family history unknown as he was adopted. Seen on 02/09/2020 in the cardiology clinic for progressive dyspnea and wt gain which was being managed by increasing home diuretics w/o much improvement in volume or symptoms. Echo repeated showing stable LVEF at 45%. RV systolic function low normal. Mild-mod MR also noted. Given persistent symptoms, he had LHC that showed progression of his native and graft disease.  All vein grafts are functionally occluded with minimal left to right collaterals.  His LIMA is patent although his LAD is diffusely diseased. While his RHC showed mPCWP mildly elevated at 19. LVEDP 23. CO by Fick 3.25, CI 1.48, by thermo calculation CO 7, CI 3.2. Juan [name] was markedly edematous and admitted for manage of acute on chronic systolic heart failure with IV diuretics. In addition, he had TTE which showed only mild-mod MR.  Juan [name] had a good response to lasix 80 IV BID.  He was transitioned to entresto with losartan bridge and unna boots placed.  It was decided to proceed with a TEE to better define the mitral valve, however TEE showed EF 30-35%, mod-severe RV dysfunction and very mild MR.  Today, Juan [name] was feeling better after TEE and he was discharge home.    1. Acute on Chronic Systolic Heart Failure - Echo 2017 showed normal LVEF 60-65% - Echo 4/21, EF mildly reduced 45%. NST showed scar but no ischemia - Echo 11/21, EF 44%, RV  systolic function low normal  - Complains of several month history of progressive dyspnea - LHC this showed progression of native vessel CAD and grafts. All vein grafts are functionally occluded with minimal left to right collaterals.  His LIMA is patent although his LAD is diffusely diseased (diabetic). No targets for intervention and not a candidate for redo CABG.  - RHC showed mildly elevated filling pressures, PCWP 19. LVEDP 23. CO by Fick 3.25, CI 1.48, by thermo calculation CO 7, CI 3.2. Admitted for IV diuresis  - Transitioned to Torsemide 40 mg once daily from home lasix 40 mg daily. - Started Entresto 24-26 mg bid today, (he has had 36 hr ACEi washout - previously on ramipril 5 mg daily)      2. CAD - s/p CABG in 2014, followed by PCI to [place] in 2018 - LHC this admit showed progression of native vessel CAD and grafts. All vein grafts are functionally occluded with minimal left to right collaterals.  His LIMA is patent although his LAD is diffusely diseased (diabetic). No targets for intervention and not a candidate for redo CABG.  - Cont ASA, Plavix, LA nitrate + ? blocker. Refusing statin    3. Mitral Regurgitation  - mild-mod MR noted on recent TTE 11/21 - there were initial concerns that his MR could have been more severe and contributing to symptoms, however TEE showed only very mild MR    4. IDDM - has insulin pump    5. Severe Hypertriglyceridemia  -  has been as high as 6,000 - likely familial but he does not know family history (adopted).  - has had multiple occurences of pancreatitis. Denies ETOH use   - on Vascepa and fenofibrate as outpatient. Continue   - denies statin, defer further management to Dr. Gwenlyn Found   Discharge Weight Range: 102.5 kg  Discharge Vitals: Blood pressure 111/75, pulse 72, temperature 97.7 F (36.5 C), temperature source Temporal, resp. rate 15, height 5\' 10"  (1.778 m), weight 102.5 kg, SpO2 98 %.  Labs: Lab Results  Component Value Date    WBC 7.5 02/16/2020   HGB 12.9 (L) 02/16/2020   HCT 40.0 02/16/2020   MCV 79.4 (L) 02/16/2020   PLT 100 (L) 02/16/2020    Recent Labs  Lab 02/17/20 0050 02/17/20 1036 02/18/20 0522  NA  --    < > 136  K  --    < > 3.9  CL  --    < > 100  CO2  --    < > 25  BUN  --    < > 26*  CREATININE  --    < > 1.34*  CALCIUM  --    < > 10.0  PROT 6.0*  --   --   BILITOT 0.6  --   --   ALKPHOS 49  --   --   ALT 60*  --   --   AST 49*  --   --   GLUCOSE  --    < > 166*   < > = values in this interval not displayed.   Lab Results  Component Value Date   CHOL 515 (H) 05/29/2018   HDL 3 (L) 05/29/2018   LDLCALC Comment 05/29/2018   TRIG 5,975 ([place]) 05/29/2018   BNP (last 3 results) No results for input(s): BNP in the last 8760 hours.  ProBNP (last 3 results) No results for input(s): PROBNP in the last 8760 hours.   Diagnostic Studies/Procedures   No results found.  Discharge Medications   Allergies as of 02/18/2020       Reactions   Reglan [metoclopramide] Other (See Comments)   Only can tolerate in low doses, in higher doses it has the opposite effect        Medication List     STOP taking these medications    furosemide 40 MG tablet Commonly known as: LASIX   ramipril 5 MG capsule Commonly known as: ALTACE   rosuvastatin 20 MG tablet Commonly known as: CRESTOR       TAKE these medications    aspirin 81 MG chewable tablet Chew 1 tablet (81 mg total) by mouth daily.   clopidogrel 75 MG tablet Commonly known as: PLAVIX TAKE 1 TABLET BY MOUTH DAILY WITH BREAKFAST. What changed: when to take this   fenofibrate 160 MG tablet Take 160 mg by mouth daily.   HUMULIN R 500 UNIT/ML injection Generic drug: insulin regular human CONCENTRATED Inject into the skin continuous. Using insulin pump   isosorbide mononitrate 30 MG 24 hr tablet Commonly known as: IMDUR Take 1 tablet (30 mg total) by mouth daily.   metFORMIN 1000 MG tablet Commonly known as:  GLUCOPHAGE Take 1,000 mg by mouth 2 (two) times daily with a meal.   metoprolol succinate 25 MG 24 hr tablet Commonly known as: TOPROL-XL Take 3 tablets (75 mg total) by mouth 2 (two) times daily. Take with or immediately following a meal.   nitroGLYCERIN 0.4 MG SL tablet Commonly known as:  Nitrostat Place 1 tablet (0.4 mg total) under the tongue every 5 (five) minutes as needed.   sacubitril-valsartan 24-26 MG Commonly known as: ENTRESTO Take 1 tablet by mouth 2 (two) times daily.   Systane Balance 0.6 % Soln Generic drug: Propylene Glycol Place 1 drop into both eyes daily as needed (dry eyes).   torsemide 20 MG tablet Commonly known as: Demadex Take 2 tablets (40 mg total) by mouth daily. Start taking on: February 19, 2020   Vascepa 1 g capsule Generic drug: icosapent Ethyl TAKE 2 CAPSULES BY MOUTH TWICE A DAY What changed: how much to take        Disposition   The patient will be discharged in stable condition to home.    Follow-up Information     Neah Sporrer, [name], MD Follow up on 02/24/2020.   Specialty: Cardiology Why: 10:30 AM [facility], [facility] Entrance C Parking Garage Code [phone] Contact information: [address] [place] [address] [phone]                   Duration of Discharge Encounter: Greater than 35 minutes   Signed, Carlene Coria  02/18/2020, 4:37 PM  Patient seen and examined with the above-signed Advanced Practice Provider and/or Housestaff. I personally reviewed laboratory data, imaging studies and relevant notes. I independently examined the patient and formulated the important aspects of the plan. I have edited the note to reflect any of my changes or salient points. I have personally discussed the plan with the patient and/or family.  Carlos for d/c. See my note for further details.   Glori Bickers, MD  3:25 PM

## 2020-02-18 NOTE — Interval H&P Note (Signed)
History and Physical Interval Note:  02/18/2020 8:24 AM  Juan [name]  has presented today for surgery, with the diagnosis of mitral regurgitation.  The various methods of treatment have been discussed with the patient and family. After consideration of risks, benefits and other options for treatment, the patient has consented to  Procedure(s): TRANSESOPHAGEAL ECHOCARDIOGRAM (TEE) (N/A) as a surgical intervention.  The patient's history has been reviewed, patient examined, no change in status, stable for surgery.  I have reviewed the patient's chart and labs.  Questions were answered to the patient's satisfaction.     Marcie Shearon

## 2020-02-18 NOTE — Progress Notes (Signed)
  Echocardiogram 2D Echocardiogram has been performed.  Juan [name] 02/18/2020, 9:07 AM

## 2020-02-19 ENCOUNTER — Telehealth (HOSPITAL_COMMUNITY): Payer: Self-pay | Admitting: Pharmacist

## 2020-02-19 NOTE — Telephone Encounter (Signed)
Patient Advocate Encounter   Received notification from [place] that prior authorization for Delene Loll is required.   PA submitted on CoverMyMeds Key [id number] Status is pending   Will continue to follow.   Audry Riles, PharmD, BCPS, BCCP, CPP Heart Failure Clinic Pharmacist [phone]

## 2020-02-21 ENCOUNTER — Encounter (HOSPITAL_COMMUNITY): Payer: Self-pay | Admitting: Internal Medicine

## 2020-02-22 NOTE — Telephone Encounter (Signed)
Advanced Heart Failure Patient Advocate Encounter  Prior Authorization for Delene Loll has been approved.    Effective dates: 02/19/2020 - 02/19/2023  Audry Riles, PharmD, BCPS, BCCP, CPP Heart Failure Clinic Pharmacist [phone]

## 2020-02-24 ENCOUNTER — Other Ambulatory Visit: Payer: Self-pay

## 2020-02-24 ENCOUNTER — Ambulatory Visit (HOSPITAL_COMMUNITY)
Admit: 2020-02-24 | Discharge: 2020-02-24 | Disposition: A | Discharge status: Home / Self Care | Payer: Commercial Managed Care - PPO | Attending: Internal Medicine | Admitting: Internal Medicine

## 2020-02-24 VITALS — BP 141/87 | HR 89 | Wt 231.0 lb

## 2020-02-24 DIAGNOSIS — E781 Pure hyperglyceridemia: Secondary | ICD-10-CM | POA: Insufficient documentation

## 2020-02-24 DIAGNOSIS — I5022 Chronic systolic (congestive) heart failure: Secondary | ICD-10-CM | POA: Diagnosis not present

## 2020-02-24 DIAGNOSIS — I5189 Other ill-defined heart diseases: Secondary | ICD-10-CM

## 2020-02-24 DIAGNOSIS — I110 Hypertensive heart disease with heart failure: Secondary | ICD-10-CM | POA: Insufficient documentation

## 2020-02-24 DIAGNOSIS — I340 Nonrheumatic mitral (valve) insufficiency: Secondary | ICD-10-CM | POA: Diagnosis not present

## 2020-02-24 DIAGNOSIS — Z951 Presence of aortocoronary bypass graft: Secondary | ICD-10-CM | POA: Insufficient documentation

## 2020-02-24 DIAGNOSIS — Z7982 Long term (current) use of aspirin: Secondary | ICD-10-CM | POA: Diagnosis not present

## 2020-02-24 DIAGNOSIS — I2510 Atherosclerotic heart disease of native coronary artery without angina pectoris: Secondary | ICD-10-CM | POA: Diagnosis not present

## 2020-02-24 DIAGNOSIS — Z8719 Personal history of other diseases of the digestive system: Secondary | ICD-10-CM | POA: Diagnosis not present

## 2020-02-24 DIAGNOSIS — E785 Hyperlipidemia, unspecified: Secondary | ICD-10-CM | POA: Diagnosis not present

## 2020-02-24 DIAGNOSIS — Z7984 Long term (current) use of oral hypoglycemic drugs: Secondary | ICD-10-CM | POA: Diagnosis not present

## 2020-02-24 DIAGNOSIS — E138 Other specified diabetes mellitus with unspecified complications: Secondary | ICD-10-CM | POA: Diagnosis not present

## 2020-02-24 DIAGNOSIS — Z7902 Long term (current) use of antithrombotics/antiplatelets: Secondary | ICD-10-CM | POA: Insufficient documentation

## 2020-02-24 DIAGNOSIS — I25810 Atherosclerosis of coronary artery bypass graft(s) without angina pectoris: Secondary | ICD-10-CM | POA: Insufficient documentation

## 2020-02-24 DIAGNOSIS — Z9641 Presence of insulin pump (external) (internal): Secondary | ICD-10-CM | POA: Diagnosis not present

## 2020-02-24 DIAGNOSIS — Z79899 Other long term (current) drug therapy: Secondary | ICD-10-CM | POA: Insufficient documentation

## 2020-02-24 DIAGNOSIS — I5032 Chronic diastolic (congestive) heart failure: Secondary | ICD-10-CM | POA: Insufficient documentation

## 2020-02-24 DIAGNOSIS — Z794 Long term (current) use of insulin: Secondary | ICD-10-CM | POA: Insufficient documentation

## 2020-02-24 DIAGNOSIS — Z8572 Personal history of non-Hodgkin lymphomas: Secondary | ICD-10-CM | POA: Insufficient documentation

## 2020-02-24 DIAGNOSIS — Z955 Presence of coronary angioplasty implant and graft: Secondary | ICD-10-CM | POA: Insufficient documentation

## 2020-02-24 LAB — BASIC METABOLIC PANEL
Anion gap: 10 (ref 5–15)
BUN: 28 mg/dL — ABNORMAL HIGH (ref 6–20)
CO2: 25 mmol/L (ref 22–32)
Calcium: 10.1 mg/dL (ref 8.9–10.3)
Chloride: 105 mmol/L (ref 98–111)
Creatinine, Ser: 1.14 mg/dL (ref 0.61–1.24)
GFR, Estimated: >60 mL/min (ref >60)
Glucose, Bld: 80 mg/dL (ref 70–99)
Potassium: 3.8 mmol/L (ref 3.5–5.1)
Sodium: 140 mmol/L (ref 135–145)

## 2020-02-24 LAB — BRAIN NATRIURETIC PEPTIDE: B Natriuretic Peptide: 420.2 pg/mL — ABNORMAL HIGH (ref 0.0–100.0)

## 2020-02-24 MED ORDER — SPIRONOLACTONE 25 MG PO TABS: 12.5000 mg | ORAL_TABLET | Freq: Every day | ORAL | 3 refills | Status: DC

## 2020-02-24 MED ORDER — TORSEMIDE 20 MG PO TABS
40.0000 mg | ORAL_TABLET | Freq: Every day | ORAL | 5 refills | Status: DC
Start: 2020-02-24 — End: 2020-02-24

## 2020-02-24 MED ORDER — TORSEMIDE 20 MG PO TABS
40.0000 mg | ORAL_TABLET | Freq: Every day | ORAL | 5 refills | Status: DC
Start: 2020-02-24 — End: 2020-05-13

## 2020-02-24 NOTE — Progress Notes (Signed)
ReDS Vest / Clip - 02/24/20 1100      ReDS Vest / Clip   Station Marker D    Ruler Value 37    ReDS Value Range Moderate volume overload    ReDS Actual Value 37    Anatomical Comments sitting

## 2020-02-24 NOTE — Patient Instructions (Addendum)
START Spironolactone 12.5mg  (1/2 tablet) Daily  IF weight is greater or equal to 227lbs, take extra 20mg  (1 tablet) in the afternoon  A letter to return to work has been sent with you  Labs done today, your results will be available in MyChart, we will contact you for abnormal readings.  Your physician recommends that you schedule a follow-up appointment in: 2 weeks with the pharmacists  Your physician recommends that you schedule a follow-up appointment in: 3 months with Dr. Haroldine Laws  If you have any questions or concerns before your next appointment please send [place] a message through [facility] or call our office at [phone].    TO LEAVE A MESSAGE FOR THE NURSE SELECT OPTION 2, PLEASE LEAVE A MESSAGE INCLUDING: . YOUR NAME . DATE OF BIRTH . CALL BACK NUMBER . REASON FOR CALL**this is important as we prioritize the call backs  YOU WILL RECEIVE A CALL BACK THE SAME DAY AS LONG AS YOU CALL BEFORE 4:00 PM

## 2020-02-24 NOTE — Progress Notes (Signed)
ADVANCED HF CLINIC NOTE  PCP: Patient, No Pcp Per Primary Cardiologist: Juan [name] HF: DB  HPI:  Juan [name] 54 y/o male w/ h/o systolic HF, EF 74%, 3VCAD s/p CABG in 2014 followed by stenting to SVG-RCA in 2018, DM2, severe hypertriglyceridemia w/ subsequent episodes of pancreatitis.   Seen by Dr. Gwenlyn [name] 02/09/2020 = for progressive dyspnea and wt gain which was being managed by increasing home diuretics w/o much improvement in volume or symptoms. Echo repeated showing stable LVEF at 45%. RV systolic function low normal. Mild-mod MR also noted. Given persistent symptoms, he had LHC that showed progression of hisnative and graft disease. All vein grafts are functionally occluded with minimal left to right collaterals. His LIMA is patent although his LADis diffusely diseased. RHC showedmPCWP mildly elevated at 19. LVEDP 23. CO by Fick 3.25, CI 1.48, by thermo calculation CO 7, CI 3.2. Juan [name] was markedly edematous and admitted for management of acute on chronic systolic heart failure with IV diuretics.In addition, he had TTE which showed only mild-mod MR.  Diuresed with IV lasix and transitioned to [facility].  Here for post-hospital f/u. Feels pretty good. No CP. Breathing better. Has gained 2 pounds since d/c weight 226.5 -> 228.5. LE edema worse particularly later int he day. Taking torsemide 40 daily. Orthopnea and PND has resolved. BP 120/70s   ReDS 37%   ROS: All systems negative except as listed in HPI, PMH and Problem List.  SH:  Social History   Socioeconomic History  . Marital status: Married    Spouse name: Juan [name]  . Number of children: 1  . Years of education: 24  . Highest education level: Not on file  Occupational History  . Occupation: Engineer, drilling     Employer: [employer]  Tobacco Use  . Smoking status: Never Smoker  . Smokeless tobacco: Never Used  Vaping Use  . Vaping Use: Never used  Substance and Sexual Activity  . Alcohol use:  No  . Drug use: No  . Sexual activity: Yes  Other Topics Concern  . Not on file  Social History Narrative   Adopted, so no pertinent FH.  Married.  Lives with wife in [place].  Independent of ADLs and ambulation.   Social Determinants of Health   Financial Resource Strain:   . Difficulty of Paying Living Expenses: Not on file  Food Insecurity:   . Worried About Charity fundraiser in the Last Year: Not on file  . Ran Out of Food in the Last Year: Not on file  Transportation Needs:   . Lack of Transportation (Medical): Not on file  . Lack of Transportation (Non-Medical): Not on file  Physical Activity:   . Days of Exercise per Week: Not on file  . Minutes of Exercise per Session: Not on file  Stress:   . Feeling of Stress : Not on file  Social Connections:   . Frequency of Communication with Friends and Family: Not on file  . Frequency of Social Gatherings with Friends and Family: Not on file  . Attends Religious Services: Not on file  . Active Member of Clubs or Organizations: Not on file  . Attends Archivist Meetings: Not on file  . Marital Status: Not on file  Intimate Partner Violence:   . Fear of Current or Ex-Partner: Not on file  . Emotionally Abused: Not on file  . Physically Abused: Not on file  . Sexually Abused: Not on file  FH:  Family History  Adopted: Yes    Past Medical History:  Diagnosis Date  . Abnormal cardiovascular stress test 02/06/2013  . Anemia    occasional - no problems currently(10/02/2011)  . Bilateral renal cysts 06/16/2011   states no known problems  . Blood transfusion without reported diagnosis   . CAD (coronary artery disease), LAD 90%, 1st diag 95%, LCX 70% wth AV groove 90%, RCA 40-50% mid and 80% long distal stenosis 02/03/13 02/04/2013  . Chest pain    positive Myoview stress test  . Coronary artery calcification seen on CAT scan   . Diabetes mellitus    IDDM  . Fatty liver disease, nonalcoholic 7/51/7001  .  Hyperlipidemia   . Hypertension    states no dx. of HTN, takes med. to protect kidneys due to DM  . Lateral meniscus tear 09/2011   left  . Loose body in knee 09/2011   loose bodies left knee  . Non Hodgkin's lymphoma ([place]) 1991  . Pancreatitis    occasional - last episode 06/2011  . S/P CABG x 4, 02/05/13 LIMA-LAD; LT. RADIAL-OM;VG-DIAG; VG-PDA 02/06/2013   10/18 3/4 patent grafts (occluded SVG-->Diag), PCI/DESx 3 SVG-->RCA, normal EF  . Splenomegaly, congestive, chronic   . Stuffy and runny nose 10/02/2011   yellow drainage from nose    Current Outpatient Medications  Medication Sig Dispense Refill  . aspirin 81 MG chewable tablet Chew 1 tablet (81 mg total) by mouth daily.    . clopidogrel (PLAVIX) 75 MG tablet TAKE 1 TABLET BY MOUTH DAILY WITH BREAKFAST. (Patient taking differently: Take 75 mg by mouth daily. ) 90 tablet 3  . fenofibrate 160 MG tablet Take 160 mg by mouth daily.     . insulin regular human CONCENTRATED (HUMULIN R) 500 UNIT/ML SOLN injection Inject into the skin continuous. Using insulin pump    . isosorbide mononitrate (IMDUR) 30 MG 24 hr tablet Take 1 tablet (30 mg total) by mouth daily. 90 tablet 3  . metFORMIN (GLUCOPHAGE) 1000 MG tablet Take 1,000 mg by mouth 2 (two) times daily with a meal.    . metoprolol succinate (TOPROL-XL) 25 MG 24 hr tablet Take 3 tablets (75 mg total) by mouth 2 (two) times daily. Take with or immediately following a meal. 540 tablet 3  . nitroGLYCERIN (NITROSTAT) 0.4 MG SL tablet Place 1 tablet (0.4 mg total) under the tongue every 5 (five) minutes as needed. 25 tablet 2  . Propylene Glycol (SYSTANE BALANCE) 0.6 % SOLN Place 1 drop into both eyes daily as needed (dry eyes).    . sacubitril-valsartan (ENTRESTO) 24-26 MG Take 1 tablet by mouth 2 (two) times daily. 60 tablet 5  . torsemide (DEMADEX) 20 MG tablet Take 2 tablets (40 mg total) by mouth daily. 60 tablet 5  . VASCEPA 1 g capsule TAKE 2 CAPSULES BY MOUTH TWICE A DAY (Patient taking  differently: Take 2 g by mouth 2 (two) times daily. ) 360 capsule 4   No current facility-administered medications for this encounter.    Vitals:   02/24/20 1042  BP: (!) 141/87  Pulse: 89  SpO2: 98%  Weight: 104.8 kg (231 lb)    PHYSICAL EXAM:  General:  Well appearing. No resp difficulty HEENT: normal Neck: supple. JVP 7-8. Carotids 2+ bilaterally; no bruits. No lymphadenopathy or thryomegaly appreciated. Cor: PMI normal. Regular rate & rhythm. No rubs, gallops or murmurs. Lungs: clear Abdomen: obese soft, nontender, nondistended. No hepatosplenomegaly. No bruits or masses. Good bowel sounds.  Extremities: no cyanosis, clubbing, rash, 1+ edema Neuro: alert & orientedx3, cranial nerves grossly intact. Moves all 4 extremities w/o difficulty. Affect pleasant.   ECG: NSR 84 RBBB LAFB Personally reviewed   ASSESSMENT & PLAN:  1. Chronic Systolic Heart Failure - Echo 2017 showed normal LVEF 60-65% - Echo 4/21, EF mildly reduced 45%. NST showed scar but no ischemia - Echo 11/21, EF 31%, RV systolic function low normal  - RHC 11/21 showed mildly elevated filling pressures, PCWP 19. LVEDP 23.CO by Fick 3.25, CI 1.48, by thermo calculation CO 7, CI 3.2. - NYHA II. Volume status mildly elevated. ReDS 37% - Add spiro 12.5 - Continue torsemide 40 daily. If weight >= 227 should take 40/20. Reinforced need for daily weights and reviewed use of sliding scale diuretics. - Continue Entresto 24-26 mg bid  - Continue Toprol 75 bid - Ideally would like to add SGLT2i but he is functionally DM1.5 so I have asked him to discuss with Dr. Debbora Presto if this is ok   2. CAD - s/p CABG in 2014, followed by PCI to [place] in 2018 -LHC this admit showed progression of native vessel CAD and grafts.All vein grafts are functionally occluded with minimal left to right collaterals. His LIMA is patent although his LADis diffusely diseased(diabetic).No targets for intervention and not a candidate for  redo CABG.  - No s/s ischemia - Cont ASA, Plavix, LA nitrate +?blocker. Refusing statin   3. Mitral Regurgitation  - very mild MR noted on recent TEE 11/21 after diresis    4. IDDM - has insulin pump  - managed by Dr. Debbora Presto - see SGLT2i discussiona bove  5. Severe Hypertriglyceridemia  - has been as high as 6,000 - likely familial but he does not know family history (adopted).  - has had multiple occurences of pancreatitis. Denies ETOH use  - on Vascepa and fenofibrate as outpatient. Continue - denies statin, defer further management to Dr. Sanda Klein, MD  7:30 PM

## 2020-02-29 ENCOUNTER — Telehealth: Payer: Self-pay

## 2020-02-29 NOTE — Progress Notes (Signed)
PCP: Patient, No Pcp Per Primary Cardiologist: Gwenlyn Found HF: DB  HPI:  Juan [name] 54 y/o male w/ h/o systolic HF, EF 14%, 3VCAD s/p CABG in 2014 followed by stenting to SVG-RCA in 2018, DM2, severe hypertriglyceridemia w/ subsequent episodes of pancreatitis.  [name] Dr. Gwenlyn Found 02/09/2020=forprogressive dyspnea and weight gain which was being managed by increasinghome diuretics w/o much improvement in volume or symptoms. Echo repeated showing stable LVEF at 45%. RV systolic function low normal. Mild-mod MR also noted. Given persistent symptoms, he hadLHC that showedprogression of hisnative and graft disease. All vein grafts are functionally occluded with minimal left to right collaterals. His LIMA is patent although his LADis diffusely diseased.RHC showedmPCWP mildly elevated at 19. LVEDP 23. CO by Fick 3.25, CI 1.48, by thermo calculation CO 7, CI 3.2.[name] edematous and admitted for management of acute on chronic systolic heart failure withIV diuretics.In addition, he hadTTEwhichshowed only mild-mod MR. Diuresed with IV furosemide and transitioned to [place].  Recently presented for post-hospital follow up on 02/24/20 with Dr. Haroldine Laws. Felt pretty good. No CP. Breathing was better. Had gained 2 pounds since d/c weight 226.5 -> 228.5. LE edema worse particularly later in the day. Was taking torsemide 40 daily. Orthopnea and PND had resolved. BP 120/70s. ReDS Clip 37%   Today he returns to HF clinic for pharmacist medication titration. At last visit with MD spironolactone 12.5 mg daily was initiated. Overall he is feeling well today. No dizziness, lightheadedness, chest pain or palpitations. Has been increasing his activity level, walks 1/2 mile per day and uses his elliptical bike. His weight at home has been ~230 lbs, which is heavier than his normal. He attributes this to dietary indiscretions over the holidays. Has been taking torsemide 60 mg daily based on  weights. Has 1+ LEE. Wearing compression stockings. States LEE is always worse in the morning and goes down throughout the day. No PND/orthopnea. Taking all medications as prescribed and tolerating all medications.    HF Medications: Metoprolol succinate 75 mg BID Entresto 24/26 mg BID Spironolactone 12.5 mg daily Torsemide 40 mg daily   Has the patient been experiencing any side effects to the medications prescribed?  no  Does the patient have any problems obtaining medications due to transportation or finances?   No - [employer]  Understanding of regimen: good Understanding of indications: good Potential of compliance: good Patient understands to avoid NSAIDs. Patient understands to avoid decongestants.    Pertinent Lab Values: . Serum creatinine 1.72, BUN 44, Potassium 6.1 (hemolysis), Sodium 135  Vital Signs: . Weight: 230.4 lbs (last clinic weight: 231 lbs) . Blood pressure: 134/68  . Heart rate: 82   Assessment: 1. Chronic Systolic Heart Failure - Echo 2017 showed normal LVEF 60-65% - Echo 4/21, EF mildly reduced 45%. NST showed scar but no ischemia - Echo 11/21, EF 48%, RV systolic function low normal  - RHC 11/21 showed mildly elevated filling pressures, PCWP 19. LVEDP 23.CO by Fick 3.25, CI 1.48, by thermo calculation CO 7, CI 3.2. - NYHA II. Volume status mildly elevated based on LEE, but likely dry based on Scr bump.  - Labs returned after patient visit: K 6.1 (noted hemolysis) and Scr increased from 1.14 to 1.72. Will repeat BMET tomorrow given hemolysis.  - Patient has been taking torsemide 60 mg daily. Will have him decrease torsemide to 40 mg daily for 3 days, then return to using sliding scale diuretics (If weight >= 227 should take 40/20) given Scr bump.  -  Continue metoprolol succinate 75 mg BID - Continue Entresto 24/26 mg BID. Will not increase today given Scr bump.  - Continue spironolactone 12.5 mg daily - Ideally would like to add SGLT2i  but he is functionally DM1.5 so Dr. Haroldine Laws has asked him to discuss with Dr. Debbora Presto if this is ok   2. CAD - s/p CABG in 2014, followed by PCI to [place] in 2018 -LHC this admit showed progression of native vessel CAD and grafts.All vein grafts are functionally occluded with minimal left to right collaterals. His LIMA is patent although his LADis diffusely diseased(diabetic).No targets for intervention and not a candidate for redo CABG.  - No s/s ischemia - Cont ASA, Plavix, LA nitrate +?blocker. Refusing statin   3. Mitral Regurgitation  - very mild MR noted on recent TEE 11/21 after diuresis    4. IDDM - has insulin pump  - managed by Dr. Debbora Presto - see SGLT2i discussion above  5. Severe Hypertriglyceridemia  - has been as high as 6,000 - likely familial but he does not know family history (adopted).  - has had multiple occurences of pancreatitis. Denies ETOH use  - on Vascepa and fenofibrate as outpatient. Continue - denies statin, defer further management to Dr. Gwenlyn Found   Plan: 1) Medication changes: Based on clinical presentation, vital signs and recent labs will decrease torsemide to 40 mg daily for 3 days, then resume sliding scale diuretics. Will not increase Entresto at this time as previously planned given Scr bump. Repeat BMET tomorrow given hemolysis.  2) Follow-up: 3 weeks with Pharmacy Clinic   Audry Riles, PharmD, BCPS, BCCP, CPP Heart Failure Clinic Pharmacist [phone]

## 2020-02-29 NOTE — Telephone Encounter (Signed)
Prior Auth for [employer] 24-26 obtained: Your PA has been resolved, no additional PA is required.

## 2020-03-01 ENCOUNTER — Other Ambulatory Visit: Payer: Self-pay

## 2020-03-01 ENCOUNTER — Ambulatory Visit (INDEPENDENT_AMBULATORY_CARE_PROVIDER_SITE_OTHER): Payer: Commercial Managed Care - PPO | Admitting: Cardiovascular Disease

## 2020-03-01 ENCOUNTER — Encounter: Payer: Self-pay | Admitting: Cardiovascular Disease

## 2020-03-01 VITALS — BP 114/67 | HR 79 | Ht 70.0 in | Wt 232.4 lb

## 2020-03-01 DIAGNOSIS — E785 Hyperlipidemia, unspecified: Secondary | ICD-10-CM

## 2020-03-01 DIAGNOSIS — I25119 Atherosclerotic heart disease of native coronary artery with unspecified angina pectoris: Secondary | ICD-10-CM | POA: Diagnosis not present

## 2020-03-01 DIAGNOSIS — I10 Essential (primary) hypertension: Secondary | ICD-10-CM

## 2020-03-01 DIAGNOSIS — I2510 Atherosclerotic heart disease of native coronary artery without angina pectoris: Secondary | ICD-10-CM

## 2020-03-01 DIAGNOSIS — Z951 Presence of aortocoronary bypass graft: Secondary | ICD-10-CM

## 2020-03-01 NOTE — Patient Instructions (Signed)
Medication Instructions:  Your physician recommends that you continue on your current medications as directed. Please refer to the Current Medication list given to you today.  *If you need a refill on your cardiac medications before your next appointment, please call your pharmacy*   Follow-Up: At [facility], you and your health needs are our priority.  As part of our continuing mission to provide you with exceptional heart care, we have created designated Provider Care Teams.  These Care Teams include your primary Cardiologist (physician) and Advanced Practice Providers (APPs -  Physician Assistants and Nurse Practitioners) who all work together to provide you with the care you need, when you need it.  We recommend signing up for the patient portal called "MyChart".  Sign up information is provided on this After Visit Summary.  MyChart is used to connect with patients for Virtual Visits (Telemedicine).  Patients are able to view lab/test results, encounter notes, upcoming appointments, etc.  Non-urgent messages can be sent to your provider as well.   To learn more about what you can do with MyChart, go to [email or web address].    Your next appointment:   3 month(s)  The format for your next appointment:   In Person  Provider:   Jonathan Berry, MD 

## 2020-03-01 NOTE — Progress Notes (Signed)
03/01/2020 Juan [name]   28-Oct-1965  [id number]  Primary Physician Patient, No Pcp Per Primary Cardiologist: Lorretta Harp MD FACP, [place], [place], [place]  HPI:  Juan [name] is a 54 y.o.  mild to moderately overweight, married Caucasian male, father of 1 child who I lastsaw in the office  02/09/2020.He has seen Dr. Sallyanne Kuster twice in 2011. He has a long history of insulin-dependent diabetes as well as extremely high hypertriglyceridemia in the 3000-6000 range with multiple episodes of pancreatitis. He had negative venous Dopplers for DVTs. He does wear compression stockings. He has complained of left inframammary chest pain and increasing shortness of breath. He is not aware of his family history since he was adopted. He had a CT scan of his chest and abdomen which showed a mass at the tail of his pancreas but also showed severe calcification in the LAD. He did have a Myoview stress test performed 2 years ago that was nonischemic. At that time, he had no symptoms of chest pain or shortness of breath. I referred him to Dr. Lavetta Nielsen at [facility] for more intense treatment of his severe hypertriglyceridemia.since I saw him last in December of last several weeks he developed exertional chest pain and shortness of breath.I was concerned that he has developed progression of disease given all of his risk factors.   He had a Myoview stress test performed that showed inferior ischemia which is high risk. He continued to have exertional chest pain with left upper irradiation.I performed cardiac catheterization on him 02/03/13 revealing three-vessel disease and preserved LV function. He subsequently underwent coronary artery bypass bypass grafting x4 by Dr. Tharon Aquas Trigt with a LIMA to his LAD, a left radial to obtuse marginal branch, vein graft to an diagonal branch and PDA. His postop course was uncomplicated. He completed 7 weeks her cardiac rehabilitation and is back to  work.  Since I saw him in the office 4 months ago he has seen Kerin Ransom back who ordered a 2-D echo that showed normal LV systolic function, grade 2 diastolic dysfunction and a Myoview stress test that was normal as well.  He wascomplaining of chest pain and left upper extremity pain. Based on this I performed outpatient cardiac catheterization on him 12/17/16 revealing high-grade disease in his RCA SVG which is stented using several synergy drug-eluting stents. This resulted in resolution of his anginal symptoms. He also has recurrent cellulitis of his right lower extremity on antibiotics with chronic lower extremity edema for which she uses compression stockings.  Hewas admitted with cellulitis and sepsis, acute renal insufficiency and demand ischemia with positive enzymes in the 4000 range. 2D echo during cephalization revealed EF of 45 to 50%. A Myoview stress test performed 06/26/2019 revealed inferior scar without ischemia. This was a new finding compared to his last Myoview in 2017. It certainly possible that the vein graft which I stented 10/18 has occluded although he is minimally symptomatic from this.  A  Myoview performed back in April did show inferior scar without ischemia with an EF of 45 to 50% by 2D echo. Since that time he is gained 13 pounds and has 2+ pitting edema. His chest pain has somewhat abated however his major complaint is progressive dyspnea on exertion.  When I saw him a month ago he had developed  2+ pitting edema.  A 2D echo showed an EF of 45% with mild to moderate MR and a Myoview stress test showed  inferolateral scar fairly similar to his Myoview performed 06/26/2019.  Given his progressive symptoms and the fact that his last cardiac cath was over 3 years ago I feel compelled to relook at his  anatomy and physiology.  I performed right and left heart cath on him 02/15/2020 revealing occluded native vessels, occluded vein graft to the RCA and diagonal  branch, patent left radial to distal OM but the OM was functionally occluded after insertion and a patent LIMA to the LAD.  His LVEDP was elevated as was his V wave.  I ended up admitting him to the heart failure service for medication optimization.  His furosemide was changed to torsemide and his ramipril to [facility].  He was diuresed.  A follow-up TEE showed only minimal MR.  He feels clinically improved.   Current Meds  Medication Sig  . aspirin 81 MG chewable tablet Chew 1 tablet (81 mg total) by mouth daily.  . clopidogrel (PLAVIX) 75 MG tablet TAKE 1 TABLET BY MOUTH DAILY WITH BREAKFAST. (Patient taking differently: Take 75 mg by mouth daily.)  . Continuous Blood Gluc Receiver ([place]) DEVI See admin instructions.  . fenofibrate 160 MG tablet Take 160 mg by mouth daily.   . Glucose Blood (BAYER CONTOUR NEXT TEST VI) glucose testing  . insulin regular human CONCENTRATED (HUMULIN R) 500 UNIT/ML injection Inject into the skin continuous. Using insulin pump  . isosorbide mononitrate (IMDUR) 30 MG 24 hr tablet Take 1 tablet (30 mg total) by mouth daily.  . metFORMIN (GLUCOPHAGE) 1000 MG tablet Take 1,000 mg by mouth 2 (two) times daily with a meal.  . metoprolol succinate (TOPROL-XL) 25 MG 24 hr tablet Take 3 tablets (75 mg total) by mouth 2 (two) times daily. Take with or immediately following a meal.  . nitroGLYCERIN (NITROSTAT) 0.4 MG SL tablet Place 1 tablet (0.4 mg total) under the tongue every 5 (five) minutes as needed.  Marland Kitchen Propylene Glycol (SYSTANE BALANCE) 0.6 % SOLN Place 1 drop into both eyes daily as needed (dry eyes).  . sacubitril-valsartan (ENTRESTO) 24-26 MG Take 1 tablet by mouth 2 (two) times daily.  Marland Kitchen spironolactone (ALDACTONE) 25 MG tablet Take 0.5 tablets (12.5 mg total) by mouth daily.  Marland Kitchen torsemide (DEMADEX) 20 MG tablet Take 2 tablets (40 mg total) by mouth daily. IF weight is greater or equal to 227lbs, take extra 20mg  (1 tablet) in the afternoon  . VASCEPA 1 g  capsule TAKE 2 CAPSULES BY MOUTH TWICE A DAY (Patient taking differently: Take 2 g by mouth 2 (two) times daily.)     Allergies  Allergen Reactions  . Reglan [Metoclopramide] Other (See Comments)    Only can tolerate in low doses, in higher doses it has the opposite effect    Social History   Socioeconomic History  . Marital status: Married    Spouse name: [name]  . Number of children: 1  . Years of education: 36  . Highest education level: Not on file  Occupational History  . Occupation: Engineer, drilling     Employer: [employer]  Tobacco Use  . Smoking status: Never Smoker  . Smokeless tobacco: Never Used  Vaping Use  . Vaping Use: Never used  Substance and Sexual Activity  . Alcohol use: No  . Drug use: No  . Sexual activity: Yes  Other Topics Concern  . Not on file  Social History Narrative   Adopted, so no pertinent FH.  Married.  Lives with wife in [place].  Independent of ADLs and ambulation.   Social Determinants of Health   Financial Resource Strain: Not on file  Food Insecurity: Not on file  Transportation Needs: Not on file  Physical Activity: Not on file  Stress: Not on file  Social Connections: Not on file  Intimate Partner Violence: Not on file     Review of Systems: General: negative for chills, fever, night sweats or weight changes.  Cardiovascular: negative for chest pain, dyspnea on exertion, edema, orthopnea, palpitations, paroxysmal nocturnal dyspnea or shortness of breath Dermatological: negative for rash Respiratory: negative for cough or wheezing Urologic: negative for hematuria Abdominal: negative for nausea, vomiting, diarrhea, bright red blood per rectum, melena, or hematemesis Neurologic: negative for visual changes, syncope, or dizziness All other systems reviewed and are otherwise negative except as noted above.    Blood pressure 114/67, pulse 79, height 5\' 10"  (1.778 m), weight 232 lb 6.4 oz (105.4 kg), SpO2 96  %.  General appearance: alert and no distress Neck: no adenopathy, no carotid bruit, no JVD, supple, symmetrical, trachea midline and thyroid not enlarged, symmetric, no tenderness/mass/nodules Lungs: clear to auscultation bilaterally Heart: regular rate and rhythm, S1, S2 normal, no murmur, click, rub or gallop Extremities: 1-2+ pitting edema bilaterally Pulses: 2+ and symmetric Skin: Skin color, texture, turgor normal. No rashes or lesions Neurologic: Alert and oriented X 3, normal strength and tone. Normal symmetric reflexes. Normal coordination and gait  EKG sinus rhythm at 79 with right bundle branch block and left axis deviation.  I personally reviewed this EKG.  ASSESSMENT AND PLAN:   CAD (coronary artery disease) Mr. Kwiatek had 3 vessel days a cath which I performed 02/03/2013 which led to CABG x4 by Dr. Darcey Nora with a LIMA to the LAD, left radial to the obtuse marginal branch, vein to a diagonal and PDA.  He had a Myoview stress test performed 06/26/2019 revealing inferior scar without ischemia, a new finding compared to his last Myoview in 2017.  I had catheterized him 12/17/2016 and placed multiple stents in a highly diseased RCA vein graft at that time because of chest pain and left upper extremity pain.  He had an echo that showed an EF of 45 to 50% with mild to moderate MR and a similar Myoview stress test.  Because of this I elected to recath him 02/15/2020 which revealed occlusion of all 3 native coronary arteries, occluded RCA vein graft, occluded diagonal graft, patent left radial to an OM but subtotally occluded OM beyond radial insertion and patent LIMA to the LAD.  His LVEDP was 23 and his V wave was 27.  I ended up admitting him to the heart failure service who diuresed him and change his medications.  A follow-up TEE revealed only mild MR after diuresis.  He feels clinically improved.      Lorretta Harp MD FACP,FACC,FAHA, [facility] 03/01/2020 12:26 PM

## 2020-03-01 NOTE — Assessment & Plan Note (Signed)
Juan [name] had 3 vessel days a cath which I performed 02/03/2013 which led to CABG x4 by Dr. Darcey Nora with a LIMA to the LAD, left radial to the obtuse marginal branch, vein to a diagonal and PDA.  He had a Myoview stress test performed 06/26/2019 revealing inferior scar without ischemia, a new finding compared to his last Myoview in 2017.  I had catheterized him 12/17/2016 and placed multiple stents in a highly diseased RCA vein graft at that time because of chest pain and left upper extremity pain.  He had an echo that showed an EF of 45 to 50% with mild to moderate MR and a similar Myoview stress test.  Because of this I elected to recath him 02/15/2020 which revealed occlusion of all 3 native coronary arteries, occluded RCA vein graft, occluded diagonal graft, patent left radial to an OM but subtotally occluded OM beyond radial insertion and patent LIMA to the LAD.  His LVEDP was 23 and his V wave was 27.  I ended up admitting him to the heart failure service who diuresed him and change his medications.  A follow-up TEE revealed only mild MR after diuresis.  He feels clinically improved.

## 2020-03-14 ENCOUNTER — Other Ambulatory Visit: Payer: Self-pay

## 2020-03-14 ENCOUNTER — Ambulatory Visit (HOSPITAL_COMMUNITY)
Admission: RE | Admit: 2020-03-14 | Discharge: 2020-03-14 | Disposition: A | Discharge status: Home / Self Care | Payer: Commercial Managed Care - PPO | Source: Ambulatory Visit | Attending: Pharmacist | Admitting: Pharmacist

## 2020-03-14 ENCOUNTER — Other Ambulatory Visit (HOSPITAL_COMMUNITY): Payer: Self-pay

## 2020-03-14 VITALS — BP 134/68 | HR 82 | Wt 230.4 lb

## 2020-03-14 DIAGNOSIS — I2510 Atherosclerotic heart disease of native coronary artery without angina pectoris: Secondary | ICD-10-CM | POA: Insufficient documentation

## 2020-03-14 DIAGNOSIS — I5022 Chronic systolic (congestive) heart failure: Secondary | ICD-10-CM | POA: Insufficient documentation

## 2020-03-14 DIAGNOSIS — Z951 Presence of aortocoronary bypass graft: Secondary | ICD-10-CM | POA: Diagnosis not present

## 2020-03-14 DIAGNOSIS — Z955 Presence of coronary angioplasty implant and graft: Secondary | ICD-10-CM | POA: Diagnosis not present

## 2020-03-14 DIAGNOSIS — E781 Pure hyperglyceridemia: Secondary | ICD-10-CM | POA: Insufficient documentation

## 2020-03-14 DIAGNOSIS — E119 Type 2 diabetes mellitus without complications: Secondary | ICD-10-CM | POA: Insufficient documentation

## 2020-03-14 DIAGNOSIS — Z794 Long term (current) use of insulin: Secondary | ICD-10-CM | POA: Insufficient documentation

## 2020-03-14 DIAGNOSIS — I340 Nonrheumatic mitral (valve) insufficiency: Secondary | ICD-10-CM | POA: Insufficient documentation

## 2020-03-14 LAB — BASIC METABOLIC PANEL
Anion gap: 11 (ref 5–15)
BUN: 44 mg/dL — ABNORMAL HIGH (ref 6–20)
CO2: 24 mmol/L (ref 22–32)
Calcium: 10.7 mg/dL — ABNORMAL HIGH (ref 8.9–10.3)
Chloride: 100 mmol/L (ref 98–111)
Creatinine, Ser: 1.72 mg/dL — ABNORMAL HIGH (ref 0.61–1.24)
GFR, Estimated: 47 mL/min — ABNORMAL LOW (ref >60)
Glucose, Bld: 299 mg/dL — ABNORMAL HIGH (ref 70–99)
Potassium: 6.1 mmol/L — ABNORMAL HIGH (ref 3.5–5.1)
Sodium: 135 mmol/L (ref 135–145)

## 2020-03-14 MED ORDER — ENTRESTO 49-51 MG PO TABS
1.0000 | ORAL_TABLET | Freq: Two times a day (BID) | ORAL | 3 refills | Status: DC
Start: 2020-03-14 — End: 2020-03-14

## 2020-03-14 MED ORDER — SACUBITRIL-VALSARTAN 24-26 MG PO TABS
1.0000 | ORAL_TABLET | Freq: Two times a day (BID) | ORAL | 3 refills | Status: DC
Start: 2020-03-14 — End: 2020-10-26

## 2020-03-14 NOTE — Patient Instructions (Signed)
It was a pleasure seeing you today!  MEDICATIONS: -We are changing your medications today -Increase Entresto to 49/51 mg (1 tablet) twice daily. You may take 2 tablets of the 24/26 mg strength twice daily until you pick up the new strength.  -Call if you have questions about your medications.  LABS: -We will call you if your labs need attention.  NEXT APPOINTMENT: Return to clinic in 3 weeks with Pharmacy Clinic.  In general, to take care of your heart failure: -Limit your fluid intake to 2 Liters (half-gallon) per day.   -Limit your salt intake to ideally 2-3 grams (2000-3000 mg) per day. -Weigh yourself daily and record, and bring that "weight diary" to your next appointment.  (Weight gain of 2-3 pounds in 1 day typically means fluid weight.) -The medications for your heart are to help your heart and help you live longer.   -Please contact us before stopping any of your heart medications.  Call the clinic at [phone] with questions or to reschedule future appointments.

## 2020-03-15 ENCOUNTER — Telehealth (HOSPITAL_COMMUNITY): Payer: Self-pay | Admitting: *Deleted

## 2020-03-15 ENCOUNTER — Ambulatory Visit (HOSPITAL_COMMUNITY)
Admission: RE | Admit: 2020-03-15 | Discharge: 2020-03-15 | Disposition: A | Discharge status: Home / Self Care | Payer: Commercial Managed Care - PPO | Source: Ambulatory Visit | Attending: Internal Medicine | Admitting: Internal Medicine

## 2020-03-15 DIAGNOSIS — I5022 Chronic systolic (congestive) heart failure: Secondary | ICD-10-CM

## 2020-03-15 LAB — BASIC METABOLIC PANEL
Anion gap: 11 (ref 5–15)
Anion gap: 15 (ref 5–15)
BUN: 39 mg/dL — ABNORMAL HIGH (ref 6–20)
BUN: 41 mg/dL — ABNORMAL HIGH (ref 6–20)
CO2: 22 mmol/L (ref 22–32)
CO2: 21 mmol/L — ABNORMAL LOW (ref 22–32)
Calcium: 10.7 mg/dL — ABNORMAL HIGH (ref 8.9–10.3)
Calcium: 11 mg/dL — ABNORMAL HIGH (ref 8.9–10.3)
Chloride: 101 mmol/L (ref 98–111)
Chloride: 98 mmol/L (ref 98–111)
Creatinine, Ser: 1.52 mg/dL — ABNORMAL HIGH (ref 0.61–1.24)
Creatinine, Ser: 1.67 mg/dL — ABNORMAL HIGH (ref 0.61–1.24)
GFR, Estimated: 54 mL/min — ABNORMAL LOW (ref >60)
GFR, Estimated: 49 mL/min — ABNORMAL LOW (ref >60)
Glucose, Bld: 277 mg/dL — ABNORMAL HIGH (ref 70–99)
Glucose, Bld: 322 mg/dL — ABNORMAL HIGH (ref 70–99)
Potassium: 7 mmol/L (ref 3.5–5.1)
Potassium: 5 mmol/L (ref 3.5–5.1)
Sodium: 134 mmol/L — ABNORMAL LOW (ref 135–145)
Sodium: 134 mmol/L — ABNORMAL LOW (ref 135–145)

## 2020-03-15 NOTE — Telephone Encounter (Signed)
Received a call from the lab about a critical results. k-7.0 but was told the lab was hemolyzed. Per Dinah Beers have patient come back for STAT bmet. I called pt he is aware and will be here today at 1:30pm for labs.

## 2020-03-21 NOTE — Progress Notes (Incomplete)
***In Progress*** PCP: Patient, No Pcp Per Primary Cardiologist: Allyson Sabal HF: DB  HPI:  Juan [name] 55 y/o male w/ h/o systolic HF, EF 45%, 3VCAD s/p CABG in 2014 followed by stenting to SVG-RCA in 2018, DM2, severe hypertriglyceridemia w/ subsequent episodes of pancreatitis.  Juan [name] Dr. Allyson Sabal 02/09/2020=forprogressive dyspnea and weight gain which was being managed by increasinghome diuretics w/o much improvement in volume or symptoms. Echo repeated showing stable LVEF at 45%. RV systolic function low normal. Mild-mod MR also noted. Given persistent symptoms, he hadLHC that showedprogression of hisnative and graft disease. All vein grafts are functionally occluded with minimal left to right collaterals. His LIMA is patent although his LADis diffusely diseased.RHC showedmPCWP mildly elevated at 19. LVEDP 23. CO by Fick 3.25, CI 1.48, by thermo calculation CO 7, CI 3.2.Mr. Juan [name] edematous and admitted for management of acute on chronic systolic heart failure withIV diuretics.In addition, he hadTTEwhichshowed only mild-mod MR. Diuresed with IV furosemide and transitioned to [place].  Recently presented for post-hospital follow up on 02/24/20 with Dr. Gala Romney. Felt pretty good. No CP. Breathing was better. Had gained 2 pounds since d/c weight 226.5 -> 228.5. LE edema worse particularly later in the day. Was taking torsemide 40 daily. Orthopnea and PND had resolved. BP 120/70s. ReDS Clip 37%   Recently presented to HF clinic for pharmacist medication titration. At last visit with MD, spironolactone 12.5 mg daily was initiated. Overall he was feeling well today. No dizziness, lightheadedness, chest pain or palpitations. Had been increasing his activity level, walking 1/2 mile per day and uses his elliptical bike. His weight at home had been ~230 lbs, which is heavier than his normal. He attributed this to dietary indiscretions over the holidays. Had been taking  torsemide 60 mg daily based on weights. Had 1+ LEE. Wearing compression stockings. Stated LEE is always worse in the morning and goes down throughout the day. No PND/orthopnea. Taking all medications as prescribed and tolerating all medications.    HF Medications: Metoprolol succinate 75 mg BID Entresto 24/26 mg BID Spironolactone 12.5 mg daily Torsemide 40 mg daily   Has the patient been experiencing any side effects to the medications prescribed?  no  Does the patient have any problems obtaining medications due to transportation or finances?   No - UMR [employer]  Understanding of regimen: good Understanding of indications: good Potential of compliance: good Patient understands to avoid NSAIDs. Patient understands to avoid decongestants.    Pertinent Lab Values: . 03/14/20: Serum creatinine 1.72, BUN 44, Potassium 6.1 (hemolysis), Sodium 135 . 03/15/20: Serum creatinine 1.67, BUN 41, Potassium 5.0 (slight hemolysis), Sodium 134  Vital Signs:*** . Weight: 230.4 lbs (last clinic weight: 231 lbs) . Blood pressure: 134/68  . Heart rate: 82   Assessment: 1. Chronic Systolic Heart Failure - Echo 0630 showed normal LVEF 60-65% - Echo 4/21, EF mildly reduced 45%. NST showed scar but no ischemia - Echo 11/21, EF 45%, RV systolic function low normal  - RHC 11/21 showed mildly elevated filling pressures, PCWP 19. LVEDP 23.CO by Fick 3.25, CI 1.48, by thermo calculation CO 7, CI 3.2. - NYHA II. Volume status mildly elevated based on LEE, but likely dry based on Scr bump.  - Labs returned after patient visit: K 6.1 (noted hemolysis) and Scr increased from 1.14 to 1.72. Will repeat BMET tomorrow given hemolysis.  - Continue torsemide 40 mg daily - Continue metoprolol succinate 75 mg BID - Continue Entresto 24/26 mg BID. Will not increase today given  Scr bump.  - Continue spironolactone 12.5 mg daily - Ideally would like to add SGLT2i but he is functionally DM1.5 so Dr.  Haroldine Laws has asked him to discuss with Dr. Debbora Presto if this is ok   2. CAD - s/p CABG in 2014, followed by PCI to [place] in 2018 -LHC this admit showed progression of native vessel CAD and grafts.All vein grafts are functionally occluded with minimal left to right collaterals. His LIMA is patent although his LADis diffusely diseased(diabetic).No targets for intervention and not a candidate for redo CABG.  - No s/s ischemia - Cont ASA, Plavix, LA nitrate +?blocker. Refusing statin   3. Mitral Regurgitation  - very mild MR noted on recent TEE 11/21 after diuresis   4. IDDM - has insulin pump  - managed by Dr. Debbora Presto - see SGLT2i discussion above  5. Severe Hypertriglyceridemia  - has been as high as 6,000 - likely familial but he does not know family history (adopted).  - has had multiple occurences of pancreatitis. Denies ETOH use  - on Vascepa and fenofibrate as outpatient. Continue - denies statin, defer further management to Dr. Gwenlyn Found   Plan: 1) Medication changes: Based on clinical presentation, vital signs and recent labs will ***  2) Follow-up: ***   Audry Riles, PharmD, BCPS, BCCP, CPP Heart Failure Clinic Pharmacist [phone]

## 2020-03-28 ENCOUNTER — Encounter (HOSPITAL_COMMUNITY): Payer: Self-pay | Admitting: *Deleted

## 2020-03-28 NOTE — Progress Notes (Signed)
Disability form completed and signed by Dr Haroldine Laws with RTW date 02/29/20, form faxed along with recent records to [facility] at [phone], pt's wife aware, copy mailed to pt

## 2020-04-02 ENCOUNTER — Other Ambulatory Visit: Payer: Self-pay | Admitting: Cardiovascular Disease

## 2020-04-06 ENCOUNTER — Inpatient Hospital Stay (HOSPITAL_COMMUNITY): Admission: RE | Admit: 2020-04-06 | Payer: Commercial Managed Care - PPO | Source: Ambulatory Visit

## 2020-04-08 NOTE — Progress Notes (Signed)
PCP: Patient, No Pcp Per Primary Cardiologist: Gwenlyn Found HF: DB  HPI:  Juan [name] 55 y/o male w/ h/o systolic HF, EF 93%, 3VCAD s/p CABG in 2014 followed by stenting to SVG-RCA in 2018, DM2, severe hypertriglyceridemia w/ subsequent episodes of pancreatitis.  Juan [name] Dr. Gwenlyn Found 02/09/2020=forprogressive dyspnea and weight gain which was being managed by increasinghome diuretics w/o much improvement in volume or symptoms. Echo repeated showing stable LVEF at 45%. RV systolic function low normal. Mild-mod MR also noted. Given persistent symptoms, he hadLHC that showedprogression of hisnative and graft disease. All vein grafts are functionally occluded with minimal left to right collaterals. His LIMA is patent although his LADis diffusely diseased.RHC showedmPCWP mildly elevated at 19. LVEDP 23. CO by Fick 3.25, CI 1.48, by thermo calculation CO 7, CI 3.2.Juan [name] edematous and admitted for management of acute on chronic systolic heart failure withIV diuretics.In addition, he hadTTEwhichshowed only mild-mod MR. Diuresed with IV furosemide and transitioned to [place].  Recently presented for post-hospital follow up on 02/24/20 with Dr. Haroldine Laws. Felt pretty good. No CP. Breathing was better. Had gained 2 pounds since d/c weight 226.5 -> 228.5. LE edema worse particularly later in the day. Was taking torsemide 40 daily. Orthopnea and PND had resolved. BP 120/70s. ReDS Clip 37%.   Recently presented to HF clinic for pharmacist medication titration. At last visit with MD, spironolactone 12.5 mg daily was initiated. Overall he was feeling well today. No dizziness, lightheadedness, chest pain or palpitations. Had been increasing his activity level, walking 1/2 mile per day and uses his elliptical bike. His weight at home had been ~230 lbs, which is heavier than his normal. He attributed this to dietary indiscretions over the holidays. Had been taking torsemide 60 mg daily  based on weights. Had 1+ LEE. Wearing compression stockings. Stated LEE is always worse in the morning and goes down throughout the day. No PND/orthopnea. Taking all medications as prescribed and tolerating all medications.    Today he returns to HF clinic for pharmacist medication titration. At last visit with pharmacy clinic, labs returned showing Scr bump from 1.14 to 1.72. He was thought to be dry, so his torsemide was decreased to 40 mg daily. Overall he reports feeling well today. No dizziness, lightheadedness, chest pain or palpitations. His breathing is fine, only gets SOB while walking fast. Notes his weight has been increasing at home. Believes this to be a combination of dietary indiscretions and fluid. Has been taking torsemide 40 mg daily and has not used any extra. Has 1+ bilateral LEE. He notes his LEE has been worse the last few days. He is not wearing compression stockings. No PND/orthopnea. Taking all medications as prescribed and tolerating all medications.    HF Medications: Metoprolol succinate 75 mg BID Entresto 24/26 mg BID Spironolactone 12.5 mg daily Torsemide 40 mg daily   Has the patient been experiencing any side effects to the medications prescribed?  no  Does the patient have any problems obtaining medications due to transportation or finances?   No - UMR [employer]  Understanding of regimen: good Understanding of indications: good Potential of compliance: good Patient understands to avoid NSAIDs. Patient understands to avoid decongestants.    Pertinent Lab Values: . 03/14/20: Serum creatinine 1.72, BUN 44, Potassium 6.1 (hemolysis), Sodium 135 . 03/15/20: Serum creatinine 1.67, BUN 41, Potassium 5.0 (slight hemolysis), Sodium 134 . 04/19/20: Serum creatinine 1.44, BUN 31, Potassium 4.4, Sodium 139, BNP 217.4 pg/mL  Vital Signs: . Weight: 237.8 lbs (last  clinic weight: 230.4 lbs) . Blood pressure: 146/78 . Heart rate: 89   Assessment: 1.  Chronic Systolic Heart Failure - Echo 2017 showed normal LVEF 60-65% - Echo 4/21, EF mildly reduced 45%. NST showed scar but no ischemia - Echo 11/21, EF 33%, RV systolic function low normal  - RHC 11/21 showed mildly elevated filling pressures, PCWP 19. LVEDP 23.CO by Fick 3.25, CI 1.48, by thermo calculation CO 7, CI 3.2. - NYHA II. Volume status mildly elevated based on LEE. Weight up 7 lbs from last clinic visit. BNP improved to 217.4 pg/mL on today's labs.   - Take torsemide 40mg  QAM/20mg  QPM for 2 days, then decrease back to torsemide 40 mg daily.  - Continue metoprolol succinate 75 mg BID - Continue Entresto 24/26 mg BID. Consider increasing at next visit.  - Continue spironolactone 12.5 mg daily - Per Dr. Debbora Presto (his endocrinologist), patient may start Jardiance 10 mg daily but he should NOT be increased to the 25 mg daily dose. Of note, patient did not tolerate Farxiga in the past due to cellulitis. Start Jardiance 10 mg daily.    2. CAD - s/p CABG in 2014, followed by PCI to [place] in 2018 -LHC this admit showed progression of native vessel CAD and grafts.All vein grafts are functionally occluded with minimal left to right collaterals. His LIMA is patent although his LADis diffusely diseased(diabetic).No targets for intervention and not a candidate for redo CABG.  - No s/s ischemia - Cont ASA, Plavix, LA nitrate +?blocker. Refusing statin   3. Mitral Regurgitation  - very mild MR noted on recent TEE 11/21 after diuresis   4. IDDM - has insulin pump  - Start Jardiance 10 mg daily as above  5. Severe Hypertriglyceridemia  - has been as high as 6,000 - likely familial but he does not know family history (adopted).  - has had multiple occurences of pancreatitis. Denies ETOH use  - on Vascepa and fenofibrate as outpatient. Continue - denies statin, defer further management to Dr. Ballard Russell, PharmD, BCPS, [facility], [facility]  Pharmacist [phone]

## 2020-04-19 ENCOUNTER — Ambulatory Visit (HOSPITAL_COMMUNITY)
Admission: RE | Admit: 2020-04-19 | Discharge: 2020-04-19 | Disposition: A | Discharge status: Home / Self Care | Payer: Commercial Managed Care - PPO | Source: Ambulatory Visit | Attending: Pharmacist | Admitting: Pharmacist

## 2020-04-19 ENCOUNTER — Other Ambulatory Visit: Payer: Self-pay

## 2020-04-19 VITALS — BP 146/78 | HR 89 | Wt 237.8 lb

## 2020-04-19 DIAGNOSIS — E119 Type 2 diabetes mellitus without complications: Secondary | ICD-10-CM | POA: Insufficient documentation

## 2020-04-19 DIAGNOSIS — I2510 Atherosclerotic heart disease of native coronary artery without angina pectoris: Secondary | ICD-10-CM | POA: Diagnosis not present

## 2020-04-19 DIAGNOSIS — Z794 Long term (current) use of insulin: Secondary | ICD-10-CM | POA: Insufficient documentation

## 2020-04-19 DIAGNOSIS — I5022 Chronic systolic (congestive) heart failure: Secondary | ICD-10-CM | POA: Diagnosis not present

## 2020-04-19 DIAGNOSIS — Z951 Presence of aortocoronary bypass graft: Secondary | ICD-10-CM | POA: Insufficient documentation

## 2020-04-19 DIAGNOSIS — Z9641 Presence of insulin pump (external) (internal): Secondary | ICD-10-CM | POA: Diagnosis not present

## 2020-04-19 DIAGNOSIS — E781 Pure hyperglyceridemia: Secondary | ICD-10-CM | POA: Insufficient documentation

## 2020-04-19 DIAGNOSIS — I340 Nonrheumatic mitral (valve) insufficiency: Secondary | ICD-10-CM | POA: Diagnosis not present

## 2020-04-19 LAB — BASIC METABOLIC PANEL
Anion gap: 10 (ref 5–15)
BUN: 31 mg/dL — ABNORMAL HIGH (ref 6–20)
CO2: 27 mmol/L (ref 22–32)
Calcium: 10.5 mg/dL — ABNORMAL HIGH (ref 8.9–10.3)
Chloride: 102 mmol/L (ref 98–111)
Creatinine, Ser: 1.44 mg/dL — ABNORMAL HIGH (ref 0.61–1.24)
GFR, Estimated: 58 mL/min — ABNORMAL LOW (ref >60)
Glucose, Bld: 219 mg/dL — ABNORMAL HIGH (ref 70–99)
Potassium: 4.4 mmol/L (ref 3.5–5.1)
Sodium: 139 mmol/L (ref 135–145)

## 2020-04-19 LAB — BRAIN NATRIURETIC PEPTIDE: B Natriuretic Peptide: 217.4 pg/mL — ABNORMAL HIGH (ref 0.0–100.0)

## 2020-04-19 MED ORDER — EMPAGLIFLOZIN 10 MG PO TABS: 10.0000 mg | ORAL_TABLET | Freq: Every day | ORAL | 11 refills | Status: DC

## 2020-04-19 NOTE — Patient Instructions (Addendum)
It was a pleasure seeing you today!  MEDICATIONS: -We are changing your medications today -Start Jardiance 10 mg (1 tablet) daily -Take torsemide 40 mg (2 tablets) every morning and 20 mg (at night) for 2 days, then decrease torsemide to 40 mg daily.  -Call if you have questions about your medications.  LABS: -We will call you if your labs need attention.  NEXT APPOINTMENT: Return to clinic in 1 month with Dr. Haroldine Laws  In general, to take care of your heart failure: -Limit your fluid intake to 2 Liters (half-gallon) per day.   -Limit your salt intake to ideally 2-3 grams (2000-3000 mg) per day. -Weigh yourself daily and record, and bring that "weight diary" to your next appointment.  (Weight gain of 2-3 pounds in 1 day typically means fluid weight.) -The medications for your heart are to help your heart and help you live longer.   -Please contact us before stopping any of your heart medications.  Call the clinic at [phone] with questions or to reschedule future appointments.

## 2020-04-27 ENCOUNTER — Telehealth (HOSPITAL_COMMUNITY): Payer: Self-pay | Admitting: Pharmacist

## 2020-04-27 MED ORDER — CEPHALEXIN 500 MG PO TABS
500.0000 mg | ORAL_TABLET | Freq: Three times a day (TID) | ORAL | 0 refills | Status: AC
Start: 2020-04-27 — End: 2020-05-07

## 2020-04-27 NOTE — Telephone Encounter (Signed)
Received message from patient that he is getting "infections on his legs" which he attributes to the Jardiance. Of note, this previously occurred when he was on Farxiga in the past. I will have him discontinue the Jardiance and update his medication list. Patient aware.   Spoke with Allena Katz, NP about the case. Will have him take cephalexin 500 mg TID x10 days for cellulitis. Sent to [facility].     Audry Riles, PharmD, BCPS, BCCP, CPP Heart Failure Clinic Pharmacist [phone]

## 2020-05-09 LAB — LIPID PANEL
Chol/HDL Ratio: 33.8 ratio — ABNORMAL HIGH (ref 0.0–5.0)
Cholesterol, Total: 338 mg/dL — ABNORMAL HIGH (ref 100–199)
HDL: 10 mg/dL — ABNORMAL LOW (ref >39)
Triglycerides: 2689 mg/dL (ref 0–149)

## 2020-05-13 ENCOUNTER — Other Ambulatory Visit: Payer: Self-pay

## 2020-05-13 ENCOUNTER — Ambulatory Visit: Payer: Commercial Managed Care - PPO | Admitting: Internal Medicine

## 2020-05-13 ENCOUNTER — Encounter: Payer: Self-pay | Admitting: Internal Medicine

## 2020-05-13 VITALS — BP 132/78 | HR 83 | Ht 70.0 in | Wt 238.0 lb

## 2020-05-13 DIAGNOSIS — I5022 Chronic systolic (congestive) heart failure: Secondary | ICD-10-CM

## 2020-05-13 DIAGNOSIS — Z951 Presence of aortocoronary bypass graft: Secondary | ICD-10-CM

## 2020-05-13 DIAGNOSIS — E783 Hyperchylomicronemia: Secondary | ICD-10-CM | POA: Diagnosis not present

## 2020-05-13 NOTE — Patient Instructions (Signed)
Medication Instructions:  Your physician recommends that you continue on your current medications as directed. Please refer to the Current Medication list given to you today.  *If you need a refill on your cardiac medications before your next appointment, please call your pharmacy**  Follow-Up: At [facility], you and your health needs are our priority.  As part of our continuing mission to provide you with exceptional heart care, we have created designated Provider Care Teams.  These Care Teams include your primary Cardiologist (physician) and Advanced Practice Providers (APPs -  Physician Assistants and Nurse Practitioners) who all work together to provide you with the care you need, when you need it.  We recommend signing up for the patient portal called "MyChart".  Sign up information is provided on this After Visit Summary.  MyChart is used to connect with patients for Virtual Visits (Telemedicine).  Patients are able to view lab/test results, encounter notes, upcoming appointments, etc.  Non-urgent messages can be sent to your provider as well.   To learn more about what you can do with MyChart, go to [email or web address].    Your next appointment:   6 month(s)  The format for your next appointment:   In Person  Provider:   K. Mali Hilty, MD-lipid clinic

## 2020-05-13 NOTE — Progress Notes (Signed)
LIPID CLINIC CONSULT NOTE  Chief Complaint:  No complaints  Primary Care Physician: Leeroy Cha, MD  HPI:  Juan [name] is a 55 y.o. male who is being seen today for the evaluation of high triglycerides at the request of No ref. provider found.  Juan [name] is seen today for evaluation of elevated triglycerides.  He has been a patient of Dr. Lavetta Nielsen at [facility] for many years.  He was last seen there in 2018.  He has hyperchylomicronemia.  Triglycerides have been very high and most recently up to 4730.  He says at best his triglycerides have come down to 450, but this was after very strict low saturated fat and calorie diet.  He says he has not been able to maintain that.  He also has diabetes and his blood sugars have not been optimally controlled.  His most recent A1c in September 2019 was 9.6.  He sees [name].  He has been on fenofibrate 160 mg.  In addition he takes low-dose simvastatin 10 mg.  He has a history of steatohepatitis and elevated liver enzymes in the past however most recently his ALT was 62 in September.  He also has clinical coronary disease having been found to have multivessel coronary disease in 2014 and subsequently he underwent coronary artery bypass grafting and has had multivessel PCI.  He was previously considered for niacin therapy however never really took that because of not being able to achieve good glycemic control.  He is not previously been on any omega-3's and had been considered for clinical trials but was not enrolled.  06/02/2018  Juan [name] returns today for follow-up of elevated triglycerides.  Fortunately, he has had a significant increase in his triglycerides since we last saw him.  His most recent labs showed total cholesterol of 515 with triglycerides of 5975.  HDL was 3 and LDL was 6 indicating persistent familial chylomicronemia syndrome.  He does say that his diet has significantly altered recently due to increased demands at work as well  as increased demands on his wife who works in a school [employer].  Although schools are now close due to the COVID-19 virus, she is now working extended hours to produce foods for children who are not at school.  He does report compliance with his medications.  12/29/2018  Juan [name] is seen today for follow-up.  Overall he seems to be doing well without any new chest pain or worsening shortness of breath.  Unfortunate his triglycerides remain elevated.  They have come down some in the mid 3600s.  His hemoglobin A1c is improved as well in the low sevens.  Unfortunately, were not able to get his triglycerides to be normalized at this point.  He is on a strict diet to lower saturated fats.  He is on maximal therapy at this point with fibrate and Vascepa.  We will need to investigate a possible clinical trial options for him.  LDL has been as low as 6.  08/10/2019  Juan [name] returns for follow-up.  Unfortunately spring was hospitalized with cellulitis/sepsis.  He was on an insulin drip and other things at that time and this may have contributed to a very low triglyceride number although previously he has had significantly elevated triglycerides and his generally never seen numbers below 800.  He continues on maximal standard therapy.  I had discussed with him previously about a clinical trial option for him and now that trial is available.  He is a  candidate for the balance trial and agreeable to participate.  Finally, he is noted to have persistent lower extremity edema.  He has seen Coletta Memos, NP about this who recommended increasing his Lasix.  I would advise increasing it to twice daily for least a couple weeks and he should have close clinical follow-up.  He still has some redness although no warmth and I suspect venous congestion of the right lower extremity.  Several rounds of antibiotics have not improved it.  He did have a ruptured blister today which I dressed.  11/14/2019  Juan [name] is seen  today in follow-up.  He again recently had a wound to the right shin.  This is dressed today.  I looked at it and it seems to be slowly healing.  No evidence of any cellulitis I encouraged him to see the wound care clinic but he was going to try to see if he could doctor it home and follow-up with me if it was not improving.  We did screen him for the balance trial, but his FCS genetic screening was surprisingly negative.  Therefore he likely has severe multifactorial hypertriglyceridemia.  Most recently his triglycerides however have improved significantly down to 209.  He also had some recent chest discomfort.  He saw one of our NP's who had started isosorbide.  He has had some mild relief with this but occasionally gets some symptoms that persist.  05/13/2020  Juan [name] is seen today in follow-up. Unfortunately had a difficult year. He underwent repeat cardiac catheterization with a decline in LVEF showing some occluded grafts. He is now being followed by Dr. Haroldine Laws in the advanced heart failure clinic. He has been struggling with some lower extremity edema and shortness of breath. His triglycerides remain significantly elevated. Although they are improved by about 50% they were 2689 on February 21. He has not had any recent episodes of pancreatitis. Total cholesterol is 338, HDL is only 10 and LDL cannot be calculated. We evaluated him for the BALANCE trial however he tested genetically negative for FCS. Fortunately, he is a candidate to rollover into the CORE file which I anticipate to start within the next couple of weeks.  PMHx:  Past Medical History:  Diagnosis Date  . Abnormal cardiovascular stress test 02/06/2013  . Anemia    occasional - no problems currently(10/02/2011)  . Bilateral renal cysts 06/16/2011   states no known problems  . Blood transfusion without reported diagnosis   . CAD (coronary artery disease), LAD 90%, 1st diag 95%, LCX 70% wth AV groove 90%, RCA 40-50% mid and 80% long  distal stenosis 02/03/13 02/04/2013  . Chest pain    positive Myoview stress test  . Coronary artery calcification seen on CAT scan   . Diabetes mellitus    IDDM  . Fatty liver disease, nonalcoholic 1/94/1740  . Hyperlipidemia   . Hypertension    states no dx. of HTN, takes med. to protect kidneys due to DM  . Lateral meniscus tear 09/2011   left  . Loose body in knee 09/2011   loose bodies left knee  . Non Hodgkin's lymphoma ([place]) 1991  . Pancreatitis    occasional - last episode 06/2011  . S/P CABG x 4, 02/05/13 LIMA-LAD; LT. RADIAL-OM;VG-DIAG; VG-PDA 02/06/2013   10/18 3/4 patent grafts (occluded SVG-->Diag), PCI/DESx 3 SVG-->RCA, normal EF  . Splenomegaly, congestive, chronic   . Stuffy and runny nose 10/02/2011   yellow drainage from nose    Past Surgical History:  Procedure Laterality Date  . ABDOMINAL AORTIC ANEURYSM REPAIR    . ANTERIOR CERVICAL DECOMP/DISCECTOMY FUSION N/A 03/26/2018   Procedure: ANTERIOR CERVICAL DECOMPRESSION FUSION CERVICAL 5-6 WITH INSTRUMENTATION AND ALLOGRAFT;  Surgeon: Phylliss Bob, MD;  Location: [place];  Service: Orthopedics;  Laterality: N/A;  . CORONARY ARTERY BYPASS GRAFT N/A 02/05/2013   Procedure: CORONARY ARTERY BYPASS GRAFTING (CABG);  Surgeon: Ivin Poot, MD;  Location: [place];  Service: Open Heart Surgery;  Laterality: N/A;  Coronary artery bypass graft on pump times four using left internal mammary artery and right greater saphenous vein via endovein harvest and left radial artery harvest.   . CORONARY STENT INTERVENTION  12/17/2016    PCI and drug-eluting stenting of the mid and distal RCA SVG   . CORONARY STENT INTERVENTION N/A 12/17/2016   Procedure: CORONARY STENT INTERVENTION;  Surgeon: Lorretta Harp, MD;  Location: [place] CV LAB;  Service: Cardiovascular;  Laterality: N/A;  . ELBOW SURGERY    . HERNIA REPAIR     inguinal   . herniated disc    . INTRAOPERATIVE TRANSESOPHAGEAL ECHOCARDIOGRAM N/A 02/05/2013   Procedure:  INTRAOPERATIVE TRANSESOPHAGEAL ECHOCARDIOGRAM;  Surgeon: Ivin Poot, MD;  Location: [place];  Service: Open Heart Surgery;  Laterality: N/A;  . KNEE ARTHROSCOPY  10/09/2011   Procedure: ARTHROSCOPY KNEE;  Surgeon: Nita Sells, MD;  Location: [place];  Service: Orthopedics;  Laterality: Left;  . LEFT HEART CATH AND CORS/GRAFTS ANGIOGRAPHY N/A 12/17/2016   Procedure: LEFT HEART CATH AND CORS/GRAFTS ANGIOGRAPHY;  Surgeon: Lorretta Harp, MD;  Location: [place] CV LAB;  Service: Cardiovascular;  Laterality: N/A;  . LEFT HEART CATHETERIZATION WITH CORONARY ANGIOGRAM N/A 02/03/2013   Procedure: LEFT HEART CATHETERIZATION WITH CORONARY ANGIOGRAM;  Surgeon: Lorretta Harp, MD;  Location: [facility] CATH LAB;  Service: Cardiovascular;  Laterality: N/A;  . NASAL SEPTUM SURGERY    . RADIAL ARTERY HARVEST Left 02/05/2013   Procedure: RADIAL ARTERY HARVEST;  Surgeon: Ivin Poot, MD;  Location: [place];  Service: Vascular;  Laterality: Left;  . RIGHT/LEFT HEART CATH AND CORONARY/GRAFT ANGIOGRAPHY N/A 02/15/2020   Procedure: RIGHT/LEFT HEART CATH AND CORONARY/GRAFT ANGIOGRAPHY;  Surgeon: Lorretta Harp, MD;  Location: [place] CV LAB;  Service: Cardiovascular;  Laterality: N/A;  . TEE WITHOUT CARDIOVERSION N/A 02/18/2020   Procedure: TRANSESOPHAGEAL ECHOCARDIOGRAM (TEE);  Surgeon: Jolaine Artist, MD;  Location: [facility] ENDOSCOPY;  Service: Cardiovascular;  Laterality: N/A;  . TIBIA BONE BIOPSY  x 3   left  . TONSILLECTOMY      FAMHx:  Family History  Adopted: Yes    SOCHx:   reports that he has never smoked. He has never used smokeless tobacco. He reports that he does not drink alcohol and does not use drugs.  ALLERGIES:  Allergies  Allergen Reactions  . Reglan [Metoclopramide] Other (See Comments)    Only can tolerate in low doses, in higher doses it has the opposite effect  . Sglt2 Inhibitors     Cellulitis with Jardiance and Farxiga    ROS: Pertinent  items noted in HPI and remainder of comprehensive ROS otherwise negative.  HOME MEDS: Current Outpatient Medications on File Prior to Visit  Medication Sig Dispense Refill  . aspirin 81 MG chewable tablet Chew 1 tablet (81 mg total) by mouth daily.    . cephALEXin (KEFLEX) 500 MG capsule Take 500 mg by mouth 3 (three) times daily.    . clopidogrel (PLAVIX) 75 MG tablet Take 75 mg  by mouth daily. 1 Tablet Daily    . Continuous Blood Gluc Receiver ([place]) DEVI See admin instructions.    . fenofibrate 160 MG tablet Take 160 mg by mouth daily.     . Glucose Blood (BAYER CONTOUR NEXT TEST VI) glucose testing    . insulin regular human CONCENTRATED (HUMULIN R) 500 UNIT/ML injection Inject into the skin continuous. Using insulin pump    . metFORMIN (GLUCOPHAGE) 1000 MG tablet Take 1,000 mg by mouth 2 (two) times daily with a meal.    . metoprolol succinate (TOPROL-XL) 25 MG 24 hr tablet Take 3 tablets (75 mg total) by mouth 2 (two) times daily. Take with or immediately following a meal. 540 tablet 3  . nitroGLYCERIN (NITROSTAT) 0.4 MG SL tablet Place 1 tablet (0.4 mg total) under the tongue every 5 (five) minutes as needed. 25 tablet 2  . Propylene Glycol (SYSTANE BALANCE) 0.6 % SOLN Place 1 drop into both eyes daily as needed (dry eyes).    . sacubitril-valsartan (ENTRESTO) 24-26 MG Take 1 tablet by mouth 2 (two) times daily. 180 tablet 3  . spironolactone (ALDACTONE) 25 MG tablet Take 0.5 tablets (12.5 mg total) by mouth daily. 15 tablet 3  . torsemide (DEMADEX) 20 MG tablet Take by mouth.    Marland Kitchen VASCEPA 1 g capsule TAKE 2 CAPSULES BY MOUTH TWICE A DAY (Patient taking differently: Take 1 g by mouth 2 (two) times daily. Take two capsules by mouth Twice Daily) 360 capsule 4  . isosorbide mononitrate (IMDUR) 30 MG 24 hr tablet Take 1 tablet (30 mg total) by mouth daily. 90 tablet 3   No current facility-administered medications on file prior to visit.    LABS/IMAGING: No results found  for this or any previous visit (from the past 48 hour(s)). No results found.  LIPID PANEL:    Component Value Date/Time   CHOL 338 (H) 05/09/2020 0822   TRIG 2,689 (HH) 05/09/2020 0822   HDL 10 (L) 05/09/2020 0822   CHOLHDL 33.8 (H) 05/09/2020 0822   CHOLHDL 14.2 06/17/2011 0400   VLDL UNABLE TO CALCULATE IF TRIGLYCERIDE OVER 400 mg/dL 06/17/2011 0400   LDLCALC Comment (A) 05/09/2020 0822   LDLDIRECT 6 05/29/2018 1123    WEIGHTS: Wt Readings from Last 3 Encounters:  05/13/20 238 lb (108 kg)  04/19/20 237 lb 12.8 oz (107.9 kg)  03/14/20 230 lb 6.4 oz (104.5 kg)    VITALS: BP 132/78   Pulse 83   Ht 5\' 10"  (1.778 m)   Wt 238 lb (108 kg)   SpO2 98%   BMI 34.15 kg/m   EXAM: Deferred  EKG: Deferred  ASSESSMENT: 1. Multifactorial severe hyperchyomicronemia -FCS genetically negative 2. ASCVD with prior four-vessel CABG and PCI 3. Insulin-dependent diabetes 4. Hypertension  PLAN: 1.   Juan [name] has a multifactorial severe hyperchylomicronemia. His testing for FCS was genetically negative. Therefore he did not qualify for the BALANCE out, however he can roll into the CORE trial. I will reach out to Preston Fleeting who is the trial coordinator to see if we can get him randomized within the next week or so. His triglycerides remain significantly elevated. Unfortunately he has had progressive coronary disease and worsening heart failure. Follow-up with me in 6 months.  Pixie Casino, MD, [facility], [place] Director of the Advanced Lipid Disorders &  Cardiovascular Risk Reduction Clinic Diplomate of the American Board of Clinical Lipidology Attending Cardiologist  Direct Dial:  [phone]  Fax: [phone]  Website:  [email or web address] 05/13/2020, 9:01 AM

## 2020-05-21 ENCOUNTER — Other Ambulatory Visit: Payer: Self-pay | Admitting: Cardiovascular Disease

## 2020-05-26 ENCOUNTER — Encounter (HOSPITAL_COMMUNITY): Payer: Self-pay | Admitting: Internal Medicine

## 2020-05-26 ENCOUNTER — Ambulatory Visit (HOSPITAL_COMMUNITY)
Admission: RE | Admit: 2020-05-26 | Discharge: 2020-05-26 | Disposition: A | Discharge status: Home / Self Care | Payer: Commercial Managed Care - PPO | Source: Ambulatory Visit | Attending: Internal Medicine | Admitting: Internal Medicine

## 2020-05-26 ENCOUNTER — Other Ambulatory Visit: Payer: Self-pay

## 2020-05-26 VITALS — BP 140/70 | HR 92 | Wt 234.2 lb

## 2020-05-26 DIAGNOSIS — E783 Hyperchylomicronemia: Secondary | ICD-10-CM | POA: Diagnosis not present

## 2020-05-26 DIAGNOSIS — I2510 Atherosclerotic heart disease of native coronary artery without angina pectoris: Secondary | ICD-10-CM | POA: Diagnosis not present

## 2020-05-26 DIAGNOSIS — Z7902 Long term (current) use of antithrombotics/antiplatelets: Secondary | ICD-10-CM | POA: Diagnosis not present

## 2020-05-26 DIAGNOSIS — Z7984 Long term (current) use of oral hypoglycemic drugs: Secondary | ICD-10-CM | POA: Diagnosis not present

## 2020-05-26 DIAGNOSIS — Z79899 Other long term (current) drug therapy: Secondary | ICD-10-CM | POA: Insufficient documentation

## 2020-05-26 DIAGNOSIS — Z955 Presence of coronary angioplasty implant and graft: Secondary | ICD-10-CM | POA: Insufficient documentation

## 2020-05-26 DIAGNOSIS — I25810 Atherosclerosis of coronary artery bypass graft(s) without angina pectoris: Secondary | ICD-10-CM | POA: Diagnosis not present

## 2020-05-26 DIAGNOSIS — E781 Pure hyperglyceridemia: Secondary | ICD-10-CM | POA: Insufficient documentation

## 2020-05-26 DIAGNOSIS — Z9641 Presence of insulin pump (external) (internal): Secondary | ICD-10-CM | POA: Diagnosis not present

## 2020-05-26 DIAGNOSIS — I110 Hypertensive heart disease with heart failure: Secondary | ICD-10-CM | POA: Insufficient documentation

## 2020-05-26 DIAGNOSIS — I5042 Chronic combined systolic (congestive) and diastolic (congestive) heart failure: Secondary | ICD-10-CM | POA: Diagnosis not present

## 2020-05-26 DIAGNOSIS — Z7982 Long term (current) use of aspirin: Secondary | ICD-10-CM | POA: Diagnosis not present

## 2020-05-26 DIAGNOSIS — E119 Type 2 diabetes mellitus without complications: Secondary | ICD-10-CM | POA: Diagnosis not present

## 2020-05-26 DIAGNOSIS — Z8572 Personal history of non-Hodgkin lymphomas: Secondary | ICD-10-CM | POA: Insufficient documentation

## 2020-05-26 DIAGNOSIS — I10 Essential (primary) hypertension: Secondary | ICD-10-CM | POA: Diagnosis not present

## 2020-05-26 DIAGNOSIS — I340 Nonrheumatic mitral (valve) insufficiency: Secondary | ICD-10-CM | POA: Diagnosis not present

## 2020-05-26 DIAGNOSIS — Z794 Long term (current) use of insulin: Secondary | ICD-10-CM | POA: Diagnosis not present

## 2020-05-26 DIAGNOSIS — Z8719 Personal history of other diseases of the digestive system: Secondary | ICD-10-CM | POA: Diagnosis not present

## 2020-05-26 HISTORY — DX: Heart failure, unspecified: I50.9

## 2020-05-26 LAB — BASIC METABOLIC PANEL
Anion gap: 7 (ref 5–15)
BUN: 34 mg/dL — ABNORMAL HIGH (ref 6–20)
CO2: 25 mmol/L (ref 22–32)
Calcium: 11 mg/dL — ABNORMAL HIGH (ref 8.9–10.3)
Chloride: 105 mmol/L (ref 98–111)
Creatinine, Ser: 1.43 mg/dL — ABNORMAL HIGH (ref 0.61–1.24)
GFR, Estimated: 59 mL/min — ABNORMAL LOW (ref >60)
Glucose, Bld: 99 mg/dL (ref 70–99)
Potassium: 4.6 mmol/L (ref 3.5–5.1)
Sodium: 137 mmol/L (ref 135–145)

## 2020-05-26 MED ORDER — SPIRONOLACTONE 25 MG PO TABS: 25.0000 mg | ORAL_TABLET | Freq: Every day | ORAL | 3 refills | Status: DC

## 2020-05-26 NOTE — Progress Notes (Signed)
ReDS Vest / Clip - 05/26/20 1000      ReDS Vest / Clip   Station Marker D    Ruler Value 34    ReDS Value Range Moderate volume overload    ReDS Actual Value 39

## 2020-05-26 NOTE — Patient Instructions (Addendum)
Labs done today. We will contact you only if your labs are abnormal.  INCREASE Spironolactone to 25mg  (1 tablet) by mouth daily.   No other medication changes were made. Please continue all current medications as prescribed.  Your physician recommends that you schedule a follow-up appointment in: 1 week for a lab only appointment and in 2 months for an appointment with our APP Clinic here in office.    If you have any questions or concerns before your next appointment please send [place] a message through [place] or call our office at [phone].    TO LEAVE A MESSAGE FOR THE NURSE SELECT OPTION 2, PLEASE LEAVE A MESSAGE INCLUDING: . YOUR NAME . DATE OF BIRTH . CALL BACK NUMBER . REASON FOR CALL**this is important as we prioritize the call backs  YOU WILL RECEIVE A CALL BACK THE SAME DAY AS LONG AS YOU CALL BEFORE 4:00 PM   Do the following things EVERYDAY: 1) Weigh yourself in the morning before breakfast. Write it down and keep it in a log. 2) Take your medicines as prescribed 3) Eat low salt foods--Limit salt (sodium) to 2000 mg per day.  4) Stay as active as you can everyday 5) Limit all fluids for the day to less than 2 liters   At the [facility], you and your health needs are our priority. As part of our continuing mission to provide you with exceptional heart care, we have created designated Provider Care Teams. These Care Teams include your primary Cardiologist (physician) and Advanced Practice Providers (APPs- Physician Assistants and Nurse Practitioners) who all work together to provide you with the care you need, when you need it.   You may see any of the following providers on your designated Care Team at your next follow up: Marland Kitchen Dr Glori Bickers . Dr Loralie Champagne . Darrick Grinder, NP . Lyda Jester, PA . Audry Riles, PharmD   Please be sure to bring in all your medications bottles to every appointment.

## 2020-05-26 NOTE — Progress Notes (Signed)
ADVANCED HF CLINIC NOTE  PCP: Leeroy Cha, MD Primary Cardiologist: Gwenlyn Found HF: DB  HPI:  Juan Liberatore Corbyis 55 y/o male w/ h/o systolic HF, EF 03%, 3VCAD s/p CABG in 2014 followed by stenting to SVG-RCA in 2018, DM2, severe hypertriglyceridemia w/ subsequent episodes of pancreatitis.   Seen by Dr. Gwenlyn Found 02/09/2020 = for progressive dyspnea and wt gain which was being managed by increasing home diuretics w/o much improvement in volume or symptoms. Echo repeated showing stable LVEF at 45%. RV systolic function low normal. Mild-mod MR also noted. Given persistent symptoms, he had LHC that showed progression of hisnative and graft disease. All vein grafts are functionally occluded with minimal left to right collaterals. His LIMA is patent although his LADis diffusely diseased. RHC showedmPCWP mildly elevated at 19. LVEDP 23. CO by Fick 3.25, CI 1.48, by thermo calculation CO 7, CI 3.2. Juan [name] was markedly edematous and admitted for management of acute on chronic systolic heart failure with IV diuretics.In addition, he had TTE which showed only mild-mod MR.  Diuresed with IV lasix and transitioned to [facility].  Here for f/u. Says he's not doing a great job keeping the fluid off. Stil working at [employer] was 61 when he left the hospital. 228 at last visit and 233 today. Taking torsemide 40 daily occasionally will take extra at night. Was taking 20 at night routinely but cut back due to bump in creatinine to 1.5 ->1.7 Can do most activities without problem. Walks 10K steps at work. Gets SOB if running or carrying 70 pound bag. No orthopnea or PND. Complaint with meds.  ReDS 37%   ROS: All systems negative except as listed in HPI, PMH and Problem List.  SH:  Social History   Socioeconomic History  . Marital status: Married    Spouse name: [name]  . Number of children: 1  . Years of education: 21  . Highest education level: Not on file  Occupational History  .  Occupation: Engineer, drilling     Employer: [employer]  Tobacco Use  . Smoking status: Never Smoker  . Smokeless tobacco: Never Used  Vaping Use  . Vaping Use: Never used  Substance and Sexual Activity  . Alcohol use: No  . Drug use: No  . Sexual activity: Yes  Other Topics Concern  . Not on file  Social History Narrative   Adopted, so no pertinent FH.  Married.  Lives with wife in [place].  Independent of ADLs and ambulation.   Social Determinants of Health   Financial Resource Strain: Not on file  Food Insecurity: Not on file  Transportation Needs: Not on file  Physical Activity: Not on file  Stress: Not on file  Social Connections: Not on file  Intimate Partner Violence: Not on file    FH:  Family History  Adopted: Yes    Past Medical History:  Diagnosis Date  . Abnormal cardiovascular stress test 02/06/2013  . Anemia    occasional - no problems currently(10/02/2011)  . Bilateral renal cysts 06/16/2011   states no known problems  . Blood transfusion without reported diagnosis   . CAD (coronary artery disease), LAD 90%, 1st diag 95%, LCX 70% wth AV groove 90%, RCA 40-50% mid and 80% long distal stenosis 02/03/13 02/04/2013  . Chest pain    positive Myoview stress test  . CHF (congestive heart failure) ([place])   . Coronary artery calcification seen on CAT scan   . Diabetes mellitus  IDDM  . Fatty liver disease, nonalcoholic 5/78/4696  . Hyperlipidemia   . Hypertension    states no dx. of HTN, takes med. to protect kidneys due to DM  . Lateral meniscus tear 09/2011   left  . Loose body in knee 09/2011   loose bodies left knee  . Non Hodgkin's lymphoma ([place]) 1991  . Pancreatitis    occasional - last episode 06/2011  . S/P CABG x 4, 02/05/13 LIMA-LAD; LT. RADIAL-OM;VG-DIAG; VG-PDA 02/06/2013   10/18 3/4 patent grafts (occluded SVG-->Diag), PCI/DESx 3 SVG-->RCA, normal EF  . Splenomegaly, congestive, chronic   . Stuffy and runny nose 10/02/2011    yellow drainage from nose    Current Outpatient Medications  Medication Sig Dispense Refill  . aspirin 81 MG chewable tablet Chew 1 tablet (81 mg total) by mouth daily.    . clopidogrel (PLAVIX) 75 MG tablet TAKE 1 TABLET BY MOUTH EVERY DAY WITH BREAKFAST 90 tablet 3  . Continuous Blood Gluc Receiver ([place]) DEVI See admin instructions.    . fenofibrate 160 MG tablet Take 160 mg by mouth daily.     . Glucose Blood (BAYER CONTOUR NEXT TEST VI) glucose testing    . insulin regular human CONCENTRATED (HUMULIN R) 500 UNIT/ML injection Inject into the skin continuous. Using insulin pump    . isosorbide mononitrate (IMDUR) 30 MG 24 hr tablet Take 1 tablet (30 mg total) by mouth daily. 90 tablet 3  . metFORMIN (GLUCOPHAGE) 1000 MG tablet Take 1,000 mg by mouth 2 (two) times daily with a meal.    . metoprolol succinate (TOPROL-XL) 25 MG 24 hr tablet Take 3 tablets (75 mg total) by mouth 2 (two) times daily. Take with or immediately following a meal. 540 tablet 3  . nitroGLYCERIN (NITROSTAT) 0.4 MG SL tablet Place 1 tablet (0.4 mg total) under the tongue every 5 (five) minutes as needed. 25 tablet 2  . Propylene Glycol (SYSTANE BALANCE) 0.6 % SOLN Place 1 drop into both eyes daily as needed (dry eyes).    . sacubitril-valsartan (ENTRESTO) 24-26 MG Take 1 tablet by mouth 2 (two) times daily. 180 tablet 3  . spironolactone (ALDACTONE) 25 MG tablet Take 0.5 tablets (12.5 mg total) by mouth daily. 15 tablet 3  . torsemide (DEMADEX) 20 MG tablet Take 40 mg by mouth daily.    Marland Kitchen torsemide (DEMADEX) 20 MG tablet Take 20 mg by mouth as needed. In the evening    . VASCEPA 1 g capsule TAKE 2 CAPSULES BY MOUTH TWICE A DAY 360 capsule 4   No current facility-administered medications for this encounter.    Vitals:   05/26/20 0945  BP: 140/70  Pulse: 92  SpO2: 96%  Weight: 106.2 kg (234 lb 3.2 oz)    PHYSICAL EXAM:  General:  Well appearing. No resp difficulty HEENT: normal Neck: supple.  no JVD. Carotids 2+ bilat; no bruits. No lymphadenopathy or thryomegaly appreciated. Cor: PMI nondisplaced. Regular rate & rhythm. No rubs, gallops or murmurs. Lungs: clear Abdomen: soft, nontender, nondistended. No hepatosplenomegaly. No bruits or masses. Good bowel sounds. Extremities: no cyanosis, clubbing, rash, edema Neuro: alert & orientedx3, cranial nerves grossly intact. moves all 4 extremities w/o difficulty. Affect pleasant   ECG: NSR 84 RBBB LAFB Personally reviewed   ASSESSMENT & PLAN:  1. Chronic Systolic Heart Failure - Echo 2017 showed normal LVEF 60-65% - Echo 4/21, EF mildly reduced 45%. NST showed scar but no ischemia - Echo 11/21, EF 29%, RV systolic function  low normal  - RHC 11/21 showed mildly elevated filling pressures, PCWP 19. LVEDP 23.CO by Fick 3.25, CI 1.48, by thermo calculation CO 7, CI 3.2. - Improved NYHA III-> II. Likely mildly volume overloaded. ReDS 39% - Increase spiro to 25 (he prefers not to split tablets) - Continue torsemide 40 daily. Will have him take an extra tab today - Continue Entresto 24-26 mg bid for now - Continue Toprol 75 bid - Ideally would like to add SGLT2i but he is functionally DM1.5 so I have asked him to discuss with Dr. Debbora Presto if this is ok. She said it was ok to try but stopped because he got cellulitis - I think Cardiomems will help better manage his fluid and avoid ups/downs in his renal function. He agrees. Will submit insurance approval   2. CAD - s/p CABG in 2014, followed by PCI to [place] in 2018 -LHC this admit showed progression of native vessel CAD and grafts.All vein grafts are functionally occluded with minimal left to right collaterals. His LIMA is patent although his LADis diffusely diseased(diabetic).No targets for intervention and not a candidate for redo CABG.  - No s/s ischemia - Cont ASA, Plavix, LA nitrate +?blocker. Refusing statin   3. Mitral Regurgitation  - very mild MR noted on recent  TEE 11/21 after diuresis  - no change - due for repeat echo   4. IDDM - has insulin pump  - managed by Dr. Debbora Presto - see SGLT2i discussion above  5. Severe Hypertriglyceridemia  - has been as high as 6,000 - likely familial but he does not know family history (adopted).  - has had multiple occurences of pancreatitis. - now follows with Dr. Debara Pickett with diagnosis of severe hyperchylomicronemia. His testing for FCS was genetically negative. Therefore he did not qualify for the BALANCE out, however he can roll into the CORE trial.  Glori Bickers, MD  9:54 AM

## 2020-05-27 ENCOUNTER — Other Ambulatory Visit (HOSPITAL_COMMUNITY): Payer: Self-pay | Admitting: Cardiology

## 2020-05-27 NOTE — Telephone Encounter (Signed)
Opened in error

## 2020-06-02 ENCOUNTER — Other Ambulatory Visit: Payer: Self-pay

## 2020-06-02 ENCOUNTER — Ambulatory Visit (HOSPITAL_COMMUNITY)
Admission: RE | Admit: 2020-06-02 | Discharge: 2020-06-02 | Disposition: A | Discharge status: Home / Self Care | Payer: Commercial Managed Care - PPO | Source: Ambulatory Visit | Attending: Internal Medicine | Admitting: Internal Medicine

## 2020-06-02 DIAGNOSIS — I5042 Chronic combined systolic (congestive) and diastolic (congestive) heart failure: Secondary | ICD-10-CM | POA: Insufficient documentation

## 2020-06-02 LAB — BASIC METABOLIC PANEL
Anion gap: 7 (ref 5–15)
BUN: 31 mg/dL — ABNORMAL HIGH (ref 6–20)
CO2: 26 mmol/L (ref 22–32)
Calcium: 10.3 mg/dL (ref 8.9–10.3)
Chloride: 100 mmol/L (ref 98–111)
Creatinine, Ser: 1.3 mg/dL — ABNORMAL HIGH (ref 0.61–1.24)
GFR, Estimated: >60 mL/min (ref >60)
Glucose, Bld: 251 mg/dL — ABNORMAL HIGH (ref 70–99)
Potassium: 4.6 mmol/L (ref 3.5–5.1)
Sodium: 133 mmol/L — ABNORMAL LOW (ref 135–145)

## 2020-06-02 LAB — BRAIN NATRIURETIC PEPTIDE: B Natriuretic Peptide: 287.3 pg/mL — ABNORMAL HIGH (ref 0.0–100.0)

## 2020-06-03 ENCOUNTER — Ambulatory Visit: Payer: Commercial Managed Care - PPO | Admitting: Cardiovascular Disease

## 2020-06-03 ENCOUNTER — Encounter: Payer: Self-pay | Admitting: Cardiovascular Disease

## 2020-06-03 VITALS — BP 130/68 | HR 89 | Ht 70.0 in | Wt 241.0 lb

## 2020-06-03 DIAGNOSIS — I5022 Chronic systolic (congestive) heart failure: Secondary | ICD-10-CM | POA: Diagnosis not present

## 2020-06-03 DIAGNOSIS — I5042 Chronic combined systolic (congestive) and diastolic (congestive) heart failure: Secondary | ICD-10-CM

## 2020-06-03 DIAGNOSIS — I5043 Acute on chronic combined systolic (congestive) and diastolic (congestive) heart failure: Secondary | ICD-10-CM

## 2020-06-03 DIAGNOSIS — E785 Hyperlipidemia, unspecified: Secondary | ICD-10-CM

## 2020-06-03 DIAGNOSIS — Z951 Presence of aortocoronary bypass graft: Secondary | ICD-10-CM

## 2020-06-03 NOTE — Assessment & Plan Note (Signed)
History of hyperlipidemia and hypertriglyceridemia followed by Dr. Debara Pickett in the lipid clinic.  He is on fenofibrate, and Vascepa.  He may be enrolled in trials for familial hypertriglyceridemia.  His most recent lipid profile performed 05/09/2020 revealed total cholesterol 338 with a triglyceride level of 2689.

## 2020-06-03 NOTE — Progress Notes (Signed)
06/03/2020 Juan [name]   [birth year]  [id number]  Primary Physician Leeroy Cha, MD Primary Cardiologist: Lorretta Harp MD FACP, [place], [place], [place]  HPI:  Juan [name] is a 55 y.o.  mild to moderately overweight, married Caucasian male, father of 1 child who I lastsaw in the office  03/01/2020.He has seen Dr. Sallyanne Kuster twice in 2011. He has a long history of insulin-dependent diabetes as well as extremely high hypertriglyceridemia in the 3000-6000 range with multiple episodes of pancreatitis. He had negative venous Dopplers for DVTs. He does wear compression stockings. He has complained of left inframammary chest pain and increasing shortness of breath. He is not aware of his family history since he was adopted. He had a CT scan of his chest and abdomen which showed a mass at the tail of his pancreas but also showed severe calcification in the LAD. He did have a Myoview stress test performed 2 years ago that was nonischemic. At that time, he had no symptoms of chest pain or shortness of breath. I referred him to Dr. Lavetta Nielsen at [facility] for more intense treatment of his severe hypertriglyceridemia.since I saw him last in December of last several weeks he developed exertional chest pain and shortness of breath.I was concerned that he has developed progression of disease given all of his risk factors.   He had a Myoview stress test performed that showed inferior ischemia which is high risk. He continued to have exertional chest pain with left upper irradiation.I performed cardiac catheterization on him 02/03/13 revealing three-vessel disease and preserved LV function. He subsequently underwent coronary artery bypass bypass grafting x4 by Dr. Tharon Aquas Trigt with a LIMA to his LAD, a left radial to obtuse marginal branch, vein graft to an diagonal branch and PDA. His postop course was uncomplicated. He completed 7 weeks her cardiac rehabilitation and is back to  work.  Since I saw him in the office 4 months ago he has seen Kerin Ransom back who ordered a 2-D echo that showed normal LV systolic function, grade 2 diastolic dysfunction and a Myoview stress test that was normal as well.  He wascomplaining of chest pain and left upper extremity pain. Based on this I performed outpatient cardiac catheterization on him 12/17/16 revealing high-grade disease in his RCA SVG which is stented using several synergy drug-eluting stents. This resulted in resolution of his anginal symptoms. He also has recurrent cellulitis of his right lower extremity on antibiotics with chronic lower extremity edema for which she uses compression stockings.  Hewas admitted with cellulitis and sepsis, acute renal insufficiency and demand ischemia with positive enzymes in the 4000 range. 2D echo during cephalization revealed EF of 45 to 50%. A Myoview stress test performed 06/26/2019 revealed inferior scar without ischemia. This was a new finding compared to his last Myoview in 2017. It certainly possible that the vein graft which I stented 10/18 has occluded although he is minimally symptomatic from this.  AMyoview performed back in April did show inferior scar without ischemia with an EF of 45 to 50% by 2D echo. Since that time he is gained 13 pounds and has 2+ pitting edema. His chest pain has somewhat abated however his major complaint is progressive dyspnea on exertion.  When I saw him a month ago he had developed 2+ pitting edema. A 2D echo showed an EF of 45% with mild to moderate MR and a Myoview stress test showed inferolateral scar fairly similar to  his Myoview performed 06/26/2019. Given his progressive symptoms and the fact that his last cardiac cath was over 3 years ago I feel compelled to relook at his anatomy and physiology.  I performed right and left heart cath on him 02/15/2020 revealing occluded native vessels, occluded vein graft to the RCA and diagonal  branch, patent left radial to distal OM but the OM was functionally occluded after insertion and a patent LIMA to the LAD.  His LVEDP was elevated as was his V wave.  I ended up admitting him to the heart failure service for medication optimization.  His furosemide was changed to torsemide and his ramipril to [facility].  He was diuresed.  A follow-up TEE showed only minimal MR.  He feels clinically improved.  Since I saw him 3 months ago he continues to do well.  He walks 10,000 steps a day.  Is minimally symptomatic.  He has seen Dr. Haroldine Laws in the heart failure clinic and he is on Entresto in addition to spironolactone, metoprolol and torsemide.  He also sees Dr. Debara Pickett in the lipid clinic for his familial hypertriglyceridemia.  He is being considered for implantation of a CardioMEMS device.    Current Meds  Medication Sig  . aspirin 81 MG chewable tablet Chew 1 tablet (81 mg total) by mouth daily.  . clopidogrel (PLAVIX) 75 MG tablet TAKE 1 TABLET BY MOUTH EVERY DAY WITH BREAKFAST  . Continuous Blood Gluc Receiver ([place]) DEVI See admin instructions.  . fenofibrate 160 MG tablet Take 160 mg by mouth daily.   . Glucose Blood (BAYER CONTOUR NEXT TEST VI) glucose testing  . insulin regular human CONCENTRATED (HUMULIN R) 500 UNIT/ML injection Inject into the skin continuous. Using insulin pump  . metFORMIN (GLUCOPHAGE) 1000 MG tablet Take 1,000 mg by mouth 2 (two) times daily with a meal.  . metoprolol succinate (TOPROL-XL) 25 MG 24 hr tablet Take 3 tablets (75 mg total) by mouth 2 (two) times daily. Take with or immediately following a meal.  . nitroGLYCERIN (NITROSTAT) 0.4 MG SL tablet Place 1 tablet (0.4 mg total) under the tongue every 5 (five) minutes as needed.  Marland Kitchen Propylene Glycol (SYSTANE BALANCE) 0.6 % SOLN Place 1 drop into both eyes daily as needed (dry eyes).  . sacubitril-valsartan (ENTRESTO) 24-26 MG Take 1 tablet by mouth 2 (two) times daily.  Marland Kitchen spironolactone  (ALDACTONE) 25 MG tablet Take 1 tablet (25 mg total) by mouth daily.  Marland Kitchen torsemide (DEMADEX) 20 MG tablet Take 40 mg by mouth daily.  Marland Kitchen torsemide (DEMADEX) 20 MG tablet Take 20 mg by mouth as needed. In the evening  . VASCEPA 1 g capsule TAKE 2 CAPSULES BY MOUTH TWICE A DAY     Allergies  Allergen Reactions  . Reglan [Metoclopramide] Other (See Comments)    Only can tolerate in low doses, in higher doses it has the opposite effect  . Sglt2 Inhibitors     Cellulitis with Vania Rea and [place]    Social History   Socioeconomic History  . Marital status: Married    Spouse name: [name]  . Number of children: 1  . Years of education: 20  . Highest education level: Not on file  Occupational History  . Occupation: Engineer, drilling     Employer: [employer]  Tobacco Use  . Smoking status: Never Smoker  . Smokeless tobacco: Never Used  Vaping Use  . Vaping Use: Never used  Substance and Sexual Activity  . Alcohol use: No  .  Drug use: No  . Sexual activity: Yes  Other Topics Concern  . Not on file  Social History Narrative   Adopted, so no pertinent FH.  Married.  Lives with wife in [place].  Independent of ADLs and ambulation.   Social Determinants of Health   Financial Resource Strain: Not on file  Food Insecurity: Not on file  Transportation Needs: Not on file  Physical Activity: Not on file  Stress: Not on file  Social Connections: Not on file  Intimate Partner Violence: Not on file     Review of Systems: General: negative for chills, fever, night sweats or weight changes.  Cardiovascular: negative for chest pain, dyspnea on exertion, edema, orthopnea, palpitations, paroxysmal nocturnal dyspnea or shortness of breath Dermatological: negative for rash Respiratory: negative for cough or wheezing Urologic: negative for hematuria Abdominal: negative for nausea, vomiting, diarrhea, bright red blood per rectum, melena, or hematemesis Neurologic:  negative for visual changes, syncope, or dizziness All other systems reviewed and are otherwise negative except as noted above.    Blood pressure 130/68, pulse 89, height 5\' 10"  (1.778 m), weight 241 lb (109.3 kg), SpO2 96 %.  General appearance: alert and no distress Neck: no adenopathy, no carotid bruit, no JVD, supple, symmetrical, trachea midline and thyroid not enlarged, symmetric, no tenderness/mass/nodules Lungs: clear to auscultation bilaterally Heart: regular rate and rhythm, S1, S2 normal, no murmur, click, rub or gallop Extremities: 2+ pitting edema Pulses: 2+ and symmetric Skin: Skin color, texture, turgor normal. No rashes or lesions Neurologic: Alert and oriented X 3, normal strength and tone. Normal symmetric reflexes. Normal coordination and gait  EKG not performed today  ASSESSMENT AND PLAN:   Dyslipidemia History of hyperlipidemia and hypertriglyceridemia followed by Dr. Debara Pickett in the lipid clinic.  He is on fenofibrate, and Vascepa.  He may be enrolled in trials for familial hypertriglyceridemia.  His most recent lipid profile performed 05/09/2020 revealed total cholesterol 338 with a triglyceride level of 2689.  S/P CABG x 4 History of CAD status post cardiac catheterization performed 02/03/2013 revealing three-vessel disease with preserved LV function.  He separately underwent CABG x4 by Dr. Darcey Nora with a LIMA to his LAD, left radial to the obtuse marginal branch, vein to a diagonal branch and PDA.  Postop course was uncomplicated.  I catheterized him 12/17/2016 revealing high-grade disease in his RCA SVG and stented him using synergy drug-eluting stents.  He became asymptomatic after this.  Because of LV dysfunction, moderate MR and progressive dyspnea I recath him 02/15/2020 revealing occluded native vessels, occluded graft to the RCA, diagonal branch with a patent left radial to OM with the OM was functionally occluded after insertion and a patent LIMA to the LAD with  diffuse disease beyond LIMA insertion.  I did a right heart cath revealed an elevated V wave and admitted him to the heart failure service for diuresis and medication optimization.  A follow-up TEE showed only minimal MR.  He denies chest pain.  He did see Dr. Haroldine Laws who has optimized his medications.  He has been considered for insertion of a CardioMEMS device.  Acute on chronic combined systolic and diastolic CHF (congestive heart failure) ([place]) History of ischemic cardiomyopathy with an EF in the 45% range with only minimal MR on TEE post diuresis and his most recent hospitalization in November.  He feels clinically improved on Entresto and metoprolol as well as torsemide.  I am going to repeat a 2D echocardiogram to assess LV function.  Dr. Haroldine Laws  continues to follow him as considering implantation of a CardioMEMS device.      Lorretta Harp MD FACP,FACC,FAHA, [facility] 06/03/2020 4:32 PM

## 2020-06-03 NOTE — Patient Instructions (Signed)
Medication Instructions:  Your physician recommends that you continue on your current medications as directed. Please refer to the Current Medication list given to you today.  *If you need a refill on your cardiac medications before your next appointment, please call your pharmacy*   Testing/Procedures: Your physician has requested that you have an echocardiogram. Echocardiography is a painless test that uses sound waves to create images of your heart. It provides your doctor with information about the size and shape of your heart and how well your heart's chambers and valves are working. This procedure takes approximately one hour. There are no restrictions for this procedure. This procedure is done at [address] Floor    Follow-Up: At [facility], you and your health needs are our priority.  As part of our continuing mission to provide you with exceptional heart care, we have created designated Provider Care Teams.  These Care Teams include your primary Cardiologist (physician) and Advanced Practice Providers (APPs -  Physician Assistants and Nurse Practitioners) who all work together to provide you with the care you need, when you need it.  We recommend signing up for the patient portal called "MyChart".  Sign up information is provided on this After Visit Summary.  MyChart is used to connect with patients for Virtual Visits (Telemedicine).  Patients are able to view lab/test results, encounter notes, upcoming appointments, etc.  Non-urgent messages can be sent to your provider as well.   To learn more about what you can do with MyChart, go to [email or web address].    Your next appointment:   12 month(s)  The format for your next appointment:   In Person  Provider:   Jonathan Berry, MD 

## 2020-06-03 NOTE — Assessment & Plan Note (Signed)
History of ischemic cardiomyopathy with an EF in the 45% range with only minimal MR on TEE post diuresis and his most recent hospitalization in November.  He feels clinically improved on Entresto and metoprolol as well as torsemide.  I am going to repeat a 2D echocardiogram to assess LV function.  Dr. Haroldine Laws continues to follow him as considering implantation of a CardioMEMS device.

## 2020-06-03 NOTE — Assessment & Plan Note (Signed)
History of CAD status post cardiac catheterization performed 02/03/2013 revealing three-vessel disease with preserved LV function.  He separately underwent CABG x4 by Dr. Darcey Nora with a LIMA to his LAD, left radial to the obtuse marginal branch, vein to a diagonal branch and PDA.  Postop course was uncomplicated.  I catheterized him 12/17/2016 revealing high-grade disease in his RCA SVG and stented him using synergy drug-eluting stents.  He became asymptomatic after this.  Because of LV dysfunction, moderate MR and progressive dyspnea I recath him 02/15/2020 revealing occluded native vessels, occluded graft to the RCA, diagonal branch with a patent left radial to OM with the OM was functionally occluded after insertion and a patent LIMA to the LAD with diffuse disease beyond LIMA insertion.  I did a right heart cath revealed an elevated V wave and admitted him to the heart failure service for diuresis and medication optimization.  A follow-up TEE showed only minimal MR.  He denies chest pain.  He did see Dr. Haroldine Laws who has optimized his medications.  He has been considered for insertion of a CardioMEMS device.

## 2020-07-01 ENCOUNTER — Other Ambulatory Visit: Payer: Self-pay

## 2020-07-01 ENCOUNTER — Ambulatory Visit (HOSPITAL_COMMUNITY): Payer: Commercial Managed Care - PPO | Attending: Cardiology

## 2020-07-01 DIAGNOSIS — I5022 Chronic systolic (congestive) heart failure: Secondary | ICD-10-CM | POA: Insufficient documentation

## 2020-07-01 DIAGNOSIS — I5042 Chronic combined systolic (congestive) and diastolic (congestive) heart failure: Secondary | ICD-10-CM | POA: Insufficient documentation

## 2020-07-01 LAB — ECHOCARDIOGRAM COMPLETE
Area-P 1/2: 4.31 cm2
S' Lateral: 3.6 cm

## 2020-07-21 ENCOUNTER — Emergency Department (HOSPITAL_COMMUNITY): Payer: Commercial Managed Care - PPO

## 2020-07-21 ENCOUNTER — Telehealth (HOSPITAL_COMMUNITY): Payer: Self-pay | Admitting: Cardiology

## 2020-07-21 ENCOUNTER — Inpatient Hospital Stay (HOSPITAL_COMMUNITY)
Admission: EM | Admit: 2020-07-21 | Discharge: 2020-07-23 | DRG: 920 | Disposition: A | Discharge status: Home / Self Care | Payer: Commercial Managed Care - PPO | Attending: Internal Medicine | Admitting: Internal Medicine

## 2020-07-21 ENCOUNTER — Other Ambulatory Visit: Payer: Self-pay

## 2020-07-21 DIAGNOSIS — Z955 Presence of coronary angioplasty implant and graft: Secondary | ICD-10-CM

## 2020-07-21 DIAGNOSIS — Y828 Other medical devices associated with adverse incidents: Secondary | ICD-10-CM | POA: Diagnosis present

## 2020-07-21 DIAGNOSIS — L03311 Cellulitis of abdominal wall: Secondary | ICD-10-CM | POA: Diagnosis not present

## 2020-07-21 DIAGNOSIS — K769 Liver disease, unspecified: Secondary | ICD-10-CM | POA: Diagnosis present

## 2020-07-21 DIAGNOSIS — T8572XA Infection and inflammatory reaction due to insulin pump, initial encounter: Principal | ICD-10-CM | POA: Diagnosis present

## 2020-07-21 DIAGNOSIS — I10 Essential (primary) hypertension: Secondary | ICD-10-CM | POA: Diagnosis present

## 2020-07-21 DIAGNOSIS — Z7984 Long term (current) use of oral hypoglycemic drugs: Secondary | ICD-10-CM

## 2020-07-21 DIAGNOSIS — N179 Acute kidney failure, unspecified: Secondary | ICD-10-CM | POA: Diagnosis not present

## 2020-07-21 DIAGNOSIS — I5042 Chronic combined systolic (congestive) and diastolic (congestive) heart failure: Secondary | ICD-10-CM | POA: Diagnosis not present

## 2020-07-21 DIAGNOSIS — E785 Hyperlipidemia, unspecified: Secondary | ICD-10-CM | POA: Diagnosis present

## 2020-07-21 DIAGNOSIS — Z7982 Long term (current) use of aspirin: Secondary | ICD-10-CM

## 2020-07-21 DIAGNOSIS — L0390 Cellulitis, unspecified: Secondary | ICD-10-CM | POA: Diagnosis present

## 2020-07-21 DIAGNOSIS — K861 Other chronic pancreatitis: Secondary | ICD-10-CM | POA: Diagnosis present

## 2020-07-21 DIAGNOSIS — Z794 Long term (current) use of insulin: Secondary | ICD-10-CM

## 2020-07-21 DIAGNOSIS — I2510 Atherosclerotic heart disease of native coronary artery without angina pectoris: Secondary | ICD-10-CM | POA: Diagnosis not present

## 2020-07-21 DIAGNOSIS — Z951 Presence of aortocoronary bypass graft: Secondary | ICD-10-CM

## 2020-07-21 DIAGNOSIS — Z888 Allergy status to other drugs, medicaments and biological substances status: Secondary | ICD-10-CM

## 2020-07-21 DIAGNOSIS — Z9641 Presence of insulin pump (external) (internal): Secondary | ICD-10-CM | POA: Diagnosis present

## 2020-07-21 DIAGNOSIS — Z79899 Other long term (current) drug therapy: Secondary | ICD-10-CM

## 2020-07-21 DIAGNOSIS — K760 Fatty (change of) liver, not elsewhere classified: Secondary | ICD-10-CM | POA: Diagnosis present

## 2020-07-21 DIAGNOSIS — Z981 Arthrodesis status: Secondary | ICD-10-CM

## 2020-07-21 DIAGNOSIS — Z20822 Contact with and (suspected) exposure to covid-19: Secondary | ICD-10-CM | POA: Diagnosis present

## 2020-07-21 DIAGNOSIS — I110 Hypertensive heart disease with heart failure: Secondary | ICD-10-CM | POA: Diagnosis present

## 2020-07-21 DIAGNOSIS — C8590 Non-Hodgkin lymphoma, unspecified, unspecified site: Secondary | ICD-10-CM | POA: Diagnosis present

## 2020-07-21 DIAGNOSIS — E119 Type 2 diabetes mellitus without complications: Secondary | ICD-10-CM

## 2020-07-21 LAB — BRAIN NATRIURETIC PEPTIDE: B Natriuretic Peptide: 285.9 pg/mL — ABNORMAL HIGH (ref 0.0–100.0)

## 2020-07-21 LAB — CBC WITH DIFFERENTIAL/PLATELET
Abs Immature Granulocytes: 0.07 10*3/uL (ref 0.00–0.07)
Basophils Absolute: 0.1 10*3/uL (ref 0.0–0.1)
Basophils Relative: 1 %
Eosinophils Absolute: 0.3 10*3/uL (ref 0.0–0.5)
Eosinophils Relative: 3 %
HCT: 38 % — ABNORMAL LOW (ref 39.0–52.0)
Hemoglobin: 12.1 g/dL — ABNORMAL LOW (ref 13.0–17.0)
Immature Granulocytes: 1 %
Lymphocytes Relative: 15 %
Lymphs Abs: 1.5 10*3/uL (ref 0.7–4.0)
MCH: 25.9 pg — ABNORMAL LOW (ref 26.0–34.0)
MCHC: 31.8 g/dL (ref 30.0–36.0)
MCV: 81.4 fL (ref 80.0–100.0)
Monocytes Absolute: 1 10*3/uL (ref 0.1–1.0)
Monocytes Relative: 10 %
Neutro Abs: 7 10*3/uL (ref 1.7–7.7)
Neutrophils Relative %: 70 %
Platelets: 158 10*3/uL (ref 150–400)
RBC: 4.67 MIL/uL (ref 4.22–5.81)
RDW: 15.9 % — ABNORMAL HIGH (ref 11.5–15.5)
WBC: 10.1 10*3/uL (ref 4.0–10.5)
nRBC: 0 % (ref 0.0–0.2)

## 2020-07-21 LAB — COMPREHENSIVE METABOLIC PANEL
ALT: 56 U/L — ABNORMAL HIGH (ref 0–44)
AST: 39 U/L (ref 15–41)
Albumin: 3.2 g/dL — ABNORMAL LOW (ref 3.5–5.0)
Alkaline Phosphatase: 50 U/L (ref 38–126)
Anion gap: 9 (ref 5–15)
BUN: 37 mg/dL — ABNORMAL HIGH (ref 6–20)
CO2: 24 mmol/L (ref 22–32)
Calcium: 10.4 mg/dL — ABNORMAL HIGH (ref 8.9–10.3)
Chloride: 102 mmol/L (ref 98–111)
Creatinine, Ser: 2.01 mg/dL — ABNORMAL HIGH (ref 0.61–1.24)
GFR, Estimated: 39 mL/min — ABNORMAL LOW (ref >60)
Glucose, Bld: 155 mg/dL — ABNORMAL HIGH (ref 70–99)
Potassium: 4.7 mmol/L (ref 3.5–5.1)
Sodium: 135 mmol/L (ref 135–145)
Total Bilirubin: 0.7 mg/dL (ref 0.3–1.2)
Total Protein: 7.4 g/dL (ref 6.5–8.1)

## 2020-07-21 LAB — URINALYSIS, ROUTINE W REFLEX MICROSCOPIC
Bacteria, UA: NONE SEEN
Bilirubin Urine: NEGATIVE
Glucose, UA: 50 mg/dL — AB
Ketones, ur: NEGATIVE mg/dL
Leukocytes,Ua: NEGATIVE
Nitrite: NEGATIVE
Protein, ur: 100 mg/dL — AB
Specific Gravity, Urine: 1.011 (ref 1.005–1.030)
pH: 5 (ref 5.0–8.0)

## 2020-07-21 LAB — RESP PANEL BY RT-PCR (FLU A&B, COVID) ARPGX2
Influenza A by PCR: NEGATIVE
Influenza B by PCR: NEGATIVE
SARS Coronavirus 2 by RT PCR: NEGATIVE

## 2020-07-21 LAB — LACTIC ACID, PLASMA: Lactic Acid, Venous: 1.7 mmol/L (ref 0.5–1.9)

## 2020-07-21 LAB — HIV ANTIBODY (ROUTINE TESTING W REFLEX): HIV Screen 4th Generation wRfx: NONREACTIVE

## 2020-07-21 LAB — MAGNESIUM: Magnesium: 2.1 mg/dL (ref 1.7–2.4)

## 2020-07-21 MED ORDER — ACETAMINOPHEN 325 MG PO TABS
650.0000 mg | ORAL_TABLET | Freq: Once | ORAL | Status: AC
  Administered 2020-07-21: 650 mg via ORAL
  Filled 2020-07-21: qty 2

## 2020-07-21 MED ORDER — ASPIRIN 81 MG PO CHEW
81.0000 mg | CHEWABLE_TABLET | Freq: Every day | ORAL | Status: DC
Start: 2020-07-22 — End: 2020-07-23
  Administered 2020-07-22 – 2020-07-23 (×2): 81 mg via ORAL
  Filled 2020-07-21 (×2): qty 1

## 2020-07-21 MED ORDER — TORSEMIDE 20 MG PO TABS
40.0000 mg | ORAL_TABLET | Freq: Every day | ORAL | Status: DC
  Administered 2020-07-22 – 2020-07-23 (×2): 40 mg via ORAL
  Filled 2020-07-21 (×2): qty 2

## 2020-07-21 MED ORDER — PROPYLENE GLYCOL 0.6 % OP SOLN: 1.0000 [drp] | Freq: Every day | OPHTHALMIC | Status: DC | PRN

## 2020-07-21 MED ORDER — ONDANSETRON HCL 4 MG/2ML IJ SOLN
4.0000 mg | Freq: Once | INTRAMUSCULAR | Status: AC
  Administered 2020-07-21: 4 mg via INTRAVENOUS
  Filled 2020-07-21: qty 2

## 2020-07-21 MED ORDER — POLYVINYL ALCOHOL 1.4 % OP SOLN
1.0000 [drp] | Freq: Every day | OPHTHALMIC | Status: DC | PRN
  Filled 2020-07-21: qty 15

## 2020-07-21 MED ORDER — METOPROLOL SUCCINATE ER 25 MG PO TB24
75.0000 mg | ORAL_TABLET | Freq: Two times a day (BID) | ORAL | Status: DC
  Administered 2020-07-22 – 2020-07-23 (×2): 75 mg via ORAL
  Filled 2020-07-21 (×3): qty 3

## 2020-07-21 MED ORDER — MORPHINE SULFATE (PF) 4 MG/ML IV SOLN
4.0000 mg | Freq: Once | INTRAVENOUS | Status: AC
  Administered 2020-07-21: 4 mg via INTRAVENOUS
  Filled 2020-07-21: qty 1

## 2020-07-21 MED ORDER — ENOXAPARIN SODIUM 40 MG/0.4ML IJ SOSY
40.0000 mg | PREFILLED_SYRINGE | INTRAMUSCULAR | Status: DC
  Administered 2020-07-21 – 2020-07-22 (×2): 40 mg via SUBCUTANEOUS
  Filled 2020-07-21 (×2): qty 0.4

## 2020-07-21 MED ORDER — SODIUM CHLORIDE 0.9% FLUSH
3.0000 mL | Freq: Two times a day (BID) | INTRAVENOUS | Status: DC
  Administered 2020-07-21 – 2020-07-23 (×4): 3 mL via INTRAVENOUS

## 2020-07-21 MED ORDER — POLYETHYLENE GLYCOL 3350 17 G PO PACK: 17.0000 g | PACK | Freq: Every day | ORAL | Status: DC | PRN

## 2020-07-21 MED ORDER — SPIRONOLACTONE 25 MG PO TABS
25.0000 mg | ORAL_TABLET | Freq: Every day | ORAL | Status: DC
Start: 2020-07-22 — End: 2020-07-23
  Administered 2020-07-22 – 2020-07-23 (×2): 25 mg via ORAL
  Filled 2020-07-21 (×2): qty 1

## 2020-07-21 MED ORDER — NITROGLYCERIN 0.4 MG SL SUBL: 0.4000 mg | SUBLINGUAL_TABLET | SUBLINGUAL | Status: DC | PRN

## 2020-07-21 MED ORDER — VANCOMYCIN HCL 1500 MG/300ML IV SOLN
1500.0000 mg | Freq: Once | INTRAVENOUS | Status: AC
  Administered 2020-07-21: 1500 mg via INTRAVENOUS
  Filled 2020-07-21: qty 300

## 2020-07-21 MED ORDER — SACUBITRIL-VALSARTAN 24-26 MG PO TABS
1.0000 | ORAL_TABLET | Freq: Two times a day (BID) | ORAL | Status: DC
  Administered 2020-07-22 – 2020-07-23 (×3): 1 via ORAL
  Filled 2020-07-21 (×4): qty 1

## 2020-07-21 MED ORDER — VANCOMYCIN HCL 1000 MG/200ML IV SOLN
1000.0000 mg | INTRAVENOUS | Status: DC
  Administered 2020-07-22 – 2020-07-23 (×2): 1000 mg via INTRAVENOUS
  Filled 2020-07-21 (×2): qty 200

## 2020-07-21 MED ORDER — CLOPIDOGREL BISULFATE 75 MG PO TABS
75.0000 mg | ORAL_TABLET | Freq: Every day | ORAL | Status: DC
  Administered 2020-07-22 – 2020-07-23 (×2): 75 mg via ORAL
  Filled 2020-07-21 (×2): qty 1

## 2020-07-21 MED ORDER — ACETAMINOPHEN 325 MG PO TABS
650.0000 mg | ORAL_TABLET | Freq: Four times a day (QID) | ORAL | Status: DC | PRN
  Administered 2020-07-21 – 2020-07-23 (×5): 650 mg via ORAL
  Filled 2020-07-21 (×5): qty 2

## 2020-07-21 MED ORDER — ISOSORBIDE MONONITRATE ER 30 MG PO TB24
30.0000 mg | ORAL_TABLET | Freq: Every day | ORAL | Status: DC
  Administered 2020-07-22 – 2020-07-23 (×2): 30 mg via ORAL
  Filled 2020-07-21 (×2): qty 1

## 2020-07-21 MED ORDER — FENOFIBRATE 160 MG PO TABS
160.0000 mg | ORAL_TABLET | Freq: Every day | ORAL | Status: DC
  Administered 2020-07-22 – 2020-07-23 (×2): 160 mg via ORAL
  Filled 2020-07-21 (×2): qty 1

## 2020-07-21 MED ORDER — ACETAMINOPHEN 650 MG RE SUPP: 650.0000 mg | Freq: Four times a day (QID) | RECTAL | Status: DC | PRN

## 2020-07-21 NOTE — Progress Notes (Signed)
Patient ID: Juan [name], male   DOB: 1965-11-13, 55 y.o.   MRN: [id number] BNP returned elevated at 285.9.  This is however similar to his outpatient labs 1 month ago which was 287.  He also has had difficult to judge volume status and is being considered for cardiomems.  Also has had fluctuations in his creatinine related to his fluid status in the setting. - Continue with plan for continuation of home medications for now

## 2020-07-21 NOTE — ED Triage Notes (Signed)
Pt with hx of DM type two diabetes and insulin pump arrives for eval of two potential abscesses-one on RLQ and one on LLQ at sites where he placed his insulin pump and had swelling and redness. Placed on Bactrim on Monday by PCP, then switched to Keflex by endocrinologist yesterday. Areas becoming more swollen and painful today, and one burst while in the shower today, draining bloody puss.

## 2020-07-21 NOTE — Progress Notes (Signed)
Pharmacy Antibiotic Note  Juan [name] is a 55 y.o. male admitted on 07/21/2020 with cellulitis.  Pharmacy has been consulted for vancomycin dosing.  Tmax 100.5, wbc 10.1. Patient also appears to have acute kidney injury with scr up to 2.0.   Vancomycin 1500mg  x1 already given then 1000 mg IV Q 24 hrs. Goal AUC 400-550. Expected AUC: 448 SCr used: 2.0   Plan: Vancomycin 1g IV q24 hours  Height: 5\' 11"  (180.3 cm) Weight: 105.7 kg (233 lb) IBW/kg (Calculated) : 75.3  Temp (24hrs), Avg:99.6 F (37.6 C), Min:98.9 F (37.2 C), Max:100.5 F (38.1 C)  Recent Labs  Lab 07/21/20 1104 07/21/20 1136  WBC 10.1  --   CREATININE 2.01*  --   LATICACIDVEN  --  1.7    Estimated Creatinine Clearance: 52.6 mL/min (A) (by C-G formula based on SCr of 2.01 mg/dL (H)).    Allergies  Allergen Reactions  . Reglan [Metoclopramide] Other (See Comments)    Only can tolerate in low doses, in higher doses it has the opposite effect  . Sglt2 Inhibitors     Cellulitis with Vania Rea and Wilder Glade    Thank you for allowing pharmacy to be a part of this patient's care.  Erin Hearing PharmD., BCPS Clinical Pharmacist 07/21/2020 8:09 PM

## 2020-07-21 NOTE — ED Provider Notes (Signed)
[facility] EMERGENCY DEPARTMENT Provider Note   CSN: [id number] Arrival date & time: 07/21/20  1030     History Chief Complaint  Patient presents with  . Abscess    Juan [name] is a 55 y.o. male with history significant for IDDM, hypertension, hyperlipidemia, CAD presents for evaluation of cellulitis and possible abscess.  Patient states he has history of recurrent cellulitis.  Symptoms began 1 week ago.  Initially stated to right lower quadrant.  He removed this and then place his insulin pump to his left lower quadrant.  Redness has worsened.  Was seen by PCP on Monday.  Started on Bactrim.  He has been compliant with this.  He then saw his endocrinologist yesterday and stress concern due to worsening erythema, tenderness.  He was then switched to Keflex.  He has taken 4 doses of this.  Noted today the areas had become more swollen, painful.  He noticed earlier today that the left knee wound had started oozing bloody pus.  He has felt warm however is not taken his temperature.  He rates his pain a 7/10.  Denies headache, lightness, dizziness, chest pain, shortness of breath, dysuria, hematuria.  Denies additional aggravating or alleviating factors.  History obtained from patient and past medical records.  No interpreter used  PCPSadie Haber   HPI     Past Medical History:  Diagnosis Date  . Abnormal cardiovascular stress test 02/06/2013  . Anemia    occasional - no problems currently(10/02/2011)  . Bilateral renal cysts 06/16/2011   states no known problems  . Blood transfusion without reported diagnosis   . CAD (coronary artery disease), LAD 90%, 1st diag 95%, LCX 70% wth AV groove 90%, RCA 40-50% mid and 80% long distal stenosis 02/03/13 02/04/2013  . Chest pain    positive Myoview stress test  . CHF (congestive heart failure) ([place])   . Coronary artery calcification seen on CAT scan   . Diabetes mellitus    IDDM  . Fatty liver disease, nonalcoholic [id number]   . Hyperlipidemia   . Hypertension    states no dx. of HTN, takes med. to protect kidneys due to DM  . Lateral meniscus tear 09/2011   left  . Loose body in knee 09/2011   loose bodies left knee  . Non Hodgkin's lymphoma ([place]) 1991  . Pancreatitis    occasional - last episode 06/2011  . S/P CABG x 4, 02/05/13 LIMA-LAD; LT. RADIAL-OM;VG-DIAG; VG-PDA 02/06/2013   10/18 3/4 patent grafts (occluded SVG-->Diag), PCI/DESx 3 SVG-->RCA, normal EF  . Splenomegaly, congestive, chronic   . Stuffy and runny nose 10/02/2011   yellow drainage from nose    Patient Active Problem List   Diagnosis Date Noted  . Cellulitis 07/21/2020  . Acute on chronic combined systolic and diastolic CHF (congestive heart failure) ([place]) 02/15/2020  . HTN (hypertension)   . Sepsis ([place])   . Cellulitis of right leg   . Hyponatremia   . Lactic acidosis   . Elevated troponin   . Radiculopathy 03/26/2018  . Right bundle branch block 01/07/2018  . Chest pain 12/17/2016  . Diastolic dysfunction [id number]  . Edema of both legs 02/03/2016  . Dyspnea on exertion 02/03/2016  . S/P CABG x 4 02/06/2013  . CAD (coronary artery disease) 02/04/2013  . Other malignant lymphomas, unspecified site, extranodal and solid organ sites 08/25/2012  . Personal history of lymphatic and hematopoietic neoplasm 06/05/2012  . Personal history of digestive disease 06/05/2012  .  Hyperlipidemia 06/04/2012  . Chronic nonalcoholic liver disease [id number]  . Thrombocytopenia ([place]) 06/17/2011  . Chronic pancreatitis ([place]) 06/16/2011  . Dyslipidemia 06/16/2011  . Hyperkalemia 06/16/2011  . Acute kidney injury ([place]) 06/16/2011  . Dehydration 06/16/2011  . Type 2 diabetes mellitus treated with insulin ([place]) 06/16/2011  . Microcytosis 06/16/2011  . Fatty liver disease, nonalcoholic [id number]  . Bilateral renal cysts 06/16/2011  . Splenomegaly 06/16/2011    Past Surgical History:  Procedure Laterality Date  . ABDOMINAL AORTIC ANEURYSM  REPAIR    . ANTERIOR CERVICAL DECOMP/DISCECTOMY FUSION N/A 03/26/2018   Procedure: ANTERIOR CERVICAL DECOMPRESSION FUSION CERVICAL 5-6 WITH INSTRUMENTATION AND ALLOGRAFT;  Surgeon: Phylliss Bob, MD;  Location: [facility];  Service: Orthopedics;  Laterality: N/A;  . CORONARY ARTERY BYPASS GRAFT N/A 02/05/2013   Procedure: CORONARY ARTERY BYPASS GRAFTING (CABG);  Surgeon: Ivin Poot, MD;  Location: [facility];  Service: Open Heart Surgery;  Laterality: N/A;  Coronary artery bypass graft on pump times four using left internal mammary artery and right greater saphenous vein via endovein harvest and left radial artery harvest.   . CORONARY STENT INTERVENTION  12/17/2016    PCI and drug-eluting stenting of the mid and distal RCA SVG   . CORONARY STENT INTERVENTION N/A 12/17/2016   Procedure: CORONARY STENT INTERVENTION;  Surgeon: Lorretta Harp, MD;  Location: [facility] CV LAB;  Service: Cardiovascular;  Laterality: N/A;  . ELBOW SURGERY    . HERNIA REPAIR     inguinal   . herniated disc    . INTRAOPERATIVE TRANSESOPHAGEAL ECHOCARDIOGRAM N/A 02/05/2013   Procedure: INTRAOPERATIVE TRANSESOPHAGEAL ECHOCARDIOGRAM;  Surgeon: Ivin Poot, MD;  Location: [facility];  Service: Open Heart Surgery;  Laterality: N/A;  . KNEE ARTHROSCOPY  10/09/2011   Procedure: ARTHROSCOPY KNEE;  Surgeon: Nita Sells, MD;  Location: [facility];  Service: Orthopedics;  Laterality: Left;  . LEFT HEART CATH AND CORS/GRAFTS ANGIOGRAPHY N/A 12/17/2016   Procedure: LEFT HEART CATH AND CORS/GRAFTS ANGIOGRAPHY;  Surgeon: Lorretta Harp, MD;  Location: [facility] CV LAB;  Service: Cardiovascular;  Laterality: N/A;  . LEFT HEART CATHETERIZATION WITH CORONARY ANGIOGRAM N/A 02/03/2013   Procedure: LEFT HEART CATHETERIZATION WITH CORONARY ANGIOGRAM;  Surgeon: Lorretta Harp, MD;  Location: [facility] CATH LAB;  Service: Cardiovascular;  Laterality: N/A;  . NASAL SEPTUM SURGERY    . RADIAL ARTERY HARVEST Left 02/05/2013    Procedure: RADIAL ARTERY HARVEST;  Surgeon: Ivin Poot, MD;  Location: [facility];  Service: Vascular;  Laterality: Left;  . RIGHT/LEFT HEART CATH AND CORONARY/GRAFT ANGIOGRAPHY N/A 02/15/2020   Procedure: RIGHT/LEFT HEART CATH AND CORONARY/GRAFT ANGIOGRAPHY;  Surgeon: Lorretta Harp, MD;  Location: [facility] CV LAB;  Service: Cardiovascular;  Laterality: N/A;  . TEE WITHOUT CARDIOVERSION N/A 02/18/2020   Procedure: TRANSESOPHAGEAL ECHOCARDIOGRAM (TEE);  Surgeon: Jolaine Artist, MD;  Location: [facility] ENDOSCOPY;  Service: Cardiovascular;  Laterality: N/A;  . TIBIA BONE BIOPSY  x 3   left  . TONSILLECTOMY         Family History  Adopted: Yes    Social History   Tobacco Use  . Smoking status: Never Smoker  . Smokeless tobacco: Never Used  Vaping Use  . Vaping Use: Never used  Substance Use Topics  . Alcohol use: No  . Drug use: No    Home Medications Prior to Admission medications   Medication Sig Start Date End Date Taking? Authorizing Provider  aspirin 81 MG chewable tablet Chew 1 tablet (81  mg total) by mouth daily. 12/19/16  Yes Reino Bellis B, NP  cephALEXin (KEFLEX) 500 MG capsule Take 500 mg by mouth 4 (four) times daily. 07/20/20  Yes [provider]  clopidogrel (PLAVIX) 75 MG tablet TAKE 1 TABLET BY MOUTH EVERY DAY WITH BREAKFAST Patient taking differently: Take 75 mg by mouth daily. 05/23/20  Yes Lorretta Harp, MD  Continuous Blood Gluc Receiver ([place]) Horace See admin instructions. 12/09/19  Yes [provider]  fenofibrate 160 MG tablet Take 160 mg by mouth daily.    Yes [provider]  Glucose Blood (BAYER CONTOUR NEXT TEST VI) glucose testing   Yes [provider]  insulin regular human CONCENTRATED (HUMULIN R) 500 UNIT/ML injection Inject into the skin continuous. Using insulin pump   Yes [provider]  isosorbide mononitrate (IMDUR) 30 MG 24 hr tablet Take 1 tablet (30 mg total) by mouth daily.  11/12/19 05/10/20 Yes Hilty, Nadean Corwin, MD  metFORMIN (GLUCOPHAGE) 1000 MG tablet Take 1,000 mg by mouth 2 (two) times daily with a meal.   Yes [provider]  metoprolol succinate (TOPROL-XL) 25 MG 24 hr tablet Take 3 tablets (75 mg total) by mouth 2 (two) times daily. Take with or immediately following a meal. 06/18/19 06/12/20 Yes Lorretta Harp, MD  Propylene Glycol (SYSTANE BALANCE) 0.6 % SOLN Place 1 drop into both eyes daily as needed (dry eyes).   Yes [provider]  sacubitril-valsartan (ENTRESTO) 24-26 MG Take 1 tablet by mouth 2 (two) times daily. 03/14/20  Yes Bensimhon, [name], MD  spironolactone (ALDACTONE) 25 MG tablet Take 1 tablet (25 mg total) by mouth daily. 05/26/20  Yes Bensimhon, [name], MD  torsemide (DEMADEX) 20 MG tablet Take 40 mg by mouth daily.   Yes [provider]  VASCEPA 1 g capsule TAKE 2 CAPSULES BY MOUTH TWICE A DAY 12/08/19  Yes Lorretta Harp, MD  nitroGLYCERIN (NITROSTAT) 0.4 MG SL tablet Place 1 tablet (0.4 mg total) under the tongue every 5 (five) minutes as needed. Patient taking differently: Place 0.4 mg under the tongue every 5 (five) minutes as needed for chest pain. 06/18/19   Lorretta Harp, MD    Allergies    Reglan [metoclopramide] and Sglt2 inhibitors  Review of Systems   Review of Systems  Constitutional: Positive for chills and fever.  HENT: Negative.   Respiratory: Negative.   Cardiovascular: Negative.   Gastrointestinal: Positive for abdominal pain. Negative for abdominal distention, constipation, diarrhea, nausea and vomiting.  Genitourinary: Negative.   Musculoskeletal: Negative.   Neurological: Negative.   All other systems reviewed and are negative.   Physical Exam Updated Vital Signs BP 121/68 (BP Location: Right Arm)   Pulse 83   Temp 98.9 F (37.2 C) (Oral)   Resp 16   Ht 5\' 11"  (1.803 m)   Wt 105.7 kg   SpO2 96%   BMI 32.50 kg/m   Physical Exam Vitals and nursing note reviewed.   Constitutional:      General: He is not in acute distress.    Appearance: He is well-developed. He is ill-appearing. He is not toxic-appearing or diaphoretic.  HENT:     Head: Normocephalic and atraumatic.     Nose: Nose normal.     Mouth/Throat:     Mouth: Mucous membranes are moist.  Eyes:     Pupils: Pupils are equal, round, and reactive to light.  Cardiovascular:     Rate and Rhythm: Normal rate  and regular rhythm.     Pulses: Normal pulses.     Heart sounds: Normal heart sounds.  Pulmonary:     Effort: Pulmonary effort is normal. No respiratory distress.     Breath sounds: Normal breath sounds.  Abdominal:     General: Bowel sounds are normal. There is no distension.     Palpations: Abdomen is soft.     Tenderness: There is abdominal tenderness. There is no right CVA tenderness, left CVA tenderness or guarding.     Hernia: No hernia is present.       Comments: 10 cm area of induration and erythema to right lower quadrant.  No central area of fluctuance.  7 cm area of erythema to left lower quadrant.  There is central area of fluctuance with active purulent drainage.  No rebound or guarding  Musculoskeletal:        General: Normal range of motion.     Cervical back: Normal range of motion and neck supple.  Skin:    General: Skin is warm and dry.     Capillary Refill: Capillary refill takes less than 2 seconds.     Findings: Erythema present.     Comments: See pictures in the chart.  Neurological:     General: No focal deficit present.     Mental Status: He is alert and oriented to person, place, and time.     Cranial Nerves: Cranial nerves are intact.     Sensory: Sensation is intact.     Motor: Motor function is intact.     Gait: Gait is intact.     Comments: Intact sensation        ED Results / Procedures / Treatments   Labs (all labs ordered are listed, but only abnormal results are displayed) Labs Reviewed  COMPREHENSIVE METABOLIC PANEL - Abnormal;  Notable for the following components:      Result Value   Glucose, Bld 155 (*)    BUN 37 (*)    Creatinine, Ser 2.01 (*)    Calcium 10.4 (*)    Albumin 3.2 (*)    ALT 56 (*)    GFR, Estimated 39 (*)    All other components within normal limits  CBC WITH DIFFERENTIAL/PLATELET - Abnormal; Notable for the following components:   Hemoglobin 12.1 (*)    HCT 38.0 (*)    MCH 25.9 (*)    RDW 15.9 (*)    All other components within normal limits  URINALYSIS, ROUTINE W REFLEX MICROSCOPIC - Abnormal; Notable for the following components:   Glucose, UA 50 (*)    Hgb urine dipstick SMALL (*)    Protein, ur 100 (*)    All other components within normal limits  RESP PANEL BY RT-PCR (FLU A&B, COVID) ARPGX2  LACTIC ACID, PLASMA  HIV ANTIBODY (ROUTINE TESTING W REFLEX)  MAGNESIUM  COMPREHENSIVE METABOLIC PANEL  CBC    EKG None  Radiology CT ABDOMEN PELVIS WO CONTRAST  Result Date: 07/21/2020 CLINICAL DATA:  Evaluate for abdominal abscess or infection. EXAM: CT ABDOMEN AND PELVIS WITHOUT CONTRAST TECHNIQUE: Multidetector CT imaging of the abdomen and pelvis was performed following the standard protocol without IV contrast. COMPARISON:  02/04/2012. FINDINGS: Lower chest: No acute abnormality. Hepatobiliary: Hypertrophy of the caudate lobe and lateral segment of left hepatic lobe identified suggesting early cirrhosis. No focal liver abnormality is seen. No gallstones, gallbladder wall thickening, or biliary dilatation. Pancreas: Fluid density structure within the distal tail of pancreas is again noted measuring  1.5 cm. This is decreased from 2.5 cm in 2013. Incompletely characterized without IV contrast, this may represent sequelae of prior pancreatitis. Spleen: The spleen is enlarged measuring 17.0 by 17.9 by 7.9 cm (volume = 1300 cm^3) Adrenals/Urinary Tract: Normal appearance of the adrenal glands. Bilateral kidney cysts are incompletely characterized without IV contrast. The largest arises from the  lateral aspect of the interpolar right kidney measuring 4.7 cm, image 44/3. Urinary bladder unremarkable. Stomach/Bowel: Stomach is nondistended. The appendix is not confidently identified separate from the right lower quadrant bowel loops. No signs of bowel wall thickening, inflammation or distension. Vascular/Lymphatic: Aortic atherosclerosis. Left upper quadrant gastric and splenic varices are identified. No abdominopelvic adenopathy. Reproductive: Prostate is unremarkable. Other: No free fluid or fluid collections within the abdomen or pelvis. Fat containing left inguinal hernia. Musculoskeletal: There is diffuse subcutaneous soft tissue stranding involving the ventral abdominal wall and bilateral lateral walls. Marked overlying skin thickening is identified with focal areas in the right lower quadrant and left lower quadrant which measures up to 2.9 cm, image 65/3 and image 65/3. Within the limitations of unenhanced technique there are no focal fluid collections identified. IMPRESSION: 1. Diffuse subcutaneous soft tissue stranding involving the ventral abdominal wall and bilateral lateral walls. Marked overlying skin thickening is identified. There are 2 areas of more focal skin thickening within the right lower quadrant and left lower quadrant of the abdomen. Imaging findings are consistent with cellulitis. Within the limitations of unenhanced technique there are no focal fluid collections identified. 2. Morphologic features of the liver compatible with early cirrhosis. 3. Splenomegaly and left upper quadrant gastric and splenic varices compatible with portal venous hypertension. 4. Aortic atherosclerosis. Aortic Atherosclerosis (ICD10-I70.0). Electronically Signed   By: Kerby Moors M.D.   On: 07/21/2020 17:39    Procedures Procedures   Medications Ordered in ED Medications  aspirin chewable tablet 81 mg (has no administration in time range)  fenofibrate tablet 160 mg (has no administration in time  range)  sacubitril-valsartan (ENTRESTO) 24-26 mg per tablet (has no administration in time range)  spironolactone (ALDACTONE) tablet 25 mg (has no administration in time range)  torsemide (DEMADEX) tablet 40 mg (has no administration in time range)  nitroGLYCERIN (NITROSTAT) SL tablet 0.4 mg (has no administration in time range)  clopidogrel (PLAVIX) tablet 75 mg (has no administration in time range)  Propylene Glycol 0.6 % SOLN 1 drop (has no administration in time range)  metoprolol succinate (TOPROL-XL) 24 hr tablet 75 mg (has no administration in time range)  isosorbide mononitrate (IMDUR) 24 hr tablet 30 mg (has no administration in time range)  enoxaparin (LOVENOX) injection 40 mg (has no administration in time range)  sodium chloride flush (NS) 0.9 % injection 3 mL (has no administration in time range)  acetaminophen (TYLENOL) tablet 650 mg (has no administration in time range)    Or  acetaminophen (TYLENOL) suppository 650 mg (has no administration in time range)  polyethylene glycol (MIRALAX / GLYCOLAX) packet 17 g (has no administration in time range)  vancomycin (VANCOREADY) IVPB 1500 mg/300 mL (1,500 mg Intravenous New Bag/Given 07/21/20 1646)  acetaminophen (TYLENOL) tablet 650 mg (650 mg Oral Given 07/21/20 1638)  ondansetron (ZOFRAN) injection 4 mg (4 mg Intravenous Given 07/21/20 1757)  morphine 4 MG/ML injection 4 mg (4 mg Intravenous Given 07/21/20 1801)    ED Course  I have reviewed the triage vital signs and the nursing notes.  Pertinent labs & imaging results that were available during my care of the  patient were reviewed by me and considered in my medical decision making (see chart for details).   55 year old here for evaluation of cellulitis and possible abscess to his lower abdomen where he places his insulin pump.  Initially had low-grade temperature on arrival to the ED however my initial evaluation moved back to room patient is febrile.  Has failed antibiotic therapy,  previously on Bactrim as well as Keflex.  Lungs clear.  Abdomen soft.  See pictures in chart he has obvious cellulitis to his bilateral lower quadrants.  There is a draining abscess to his left lower quadrant.  He has induration without any fluctuance to his left lower quadrant.   Work up started from triage which I personally reviewed and interpreted:  CBC without leukocytosis Metabolic panel with creatinine 2.01, AKI, glucose 155, GFR 39 UA negative for infection Lactic acid 1.7 CT AP WO large drainable fluid collection, limited due to lack of IV contrast due to AKI EKG without ischemia   Will started IV antibiotics, fluids.   Patient reassessed.  Discussed labs and imaging.  CT scan reassuring without any large drainable fluid collection however given failed outpatient antibiotics for cellulitis, febrile here in ED do feel he would benefit from inpatient admission for IV antibiotics.  Patient agreeable. Low suspicion for sepsis at this time.  CONSULT with Dr. Trilby Drummer with [facility] who will evaluate patient for admission  The patient appears reasonably stabilized for admission considering the current resources, flow, and capabilities available in the ED at this time, and I doubt any other [facility] requiring further screening and/or treatment in the ED prior to admission.    MDM Rules/Calculators/A&P                          Final Clinical Impression(s) / ED Diagnoses Final diagnoses:  Cellulitis of abdominal wall    Rx / DC Orders ED Discharge Orders    None       Scarlettrose Costilow A, PA-C 07/21/20 Kathlene November, MD 07/22/20 [phone]

## 2020-07-21 NOTE — Telephone Encounter (Signed)
Cardio mems application denied by insurance on 07/11/20 [facility]  commercial ref # [id number] and investigational   Message to provider as Juluis Rainier

## 2020-07-21 NOTE — H&P (Signed)
History and Physical   DERIEN GENNARO [id number] DOB: 1966-01-17 DOA: 07/21/2020  PCP: Leeroy Cha, MD   Patient coming from: Home  Chief Complaint: Abdominal pain  Skin infection  HPI: Juan [name] is a 55 y.o. male with medical history significant of heart failure, CAD, nonalcoholic fatty liver disease, chronic pancreatitis, hyperlipidemia, hypertension, lymphoma, diabetes who presents with ongoing abdominal pain and skin infection.  As above patient presents with skin infection and abdominal pain.  This issue for started about a week ago when he was using his insulin pump in his abdomen.  He noticed he had some redness around the site of his right lower quadrant and then switch it to his left lower quadrant.  He did notice continued swelling and redness at the sites and was seen by his PCP 3 days ago started him on Bactrim.  He was compliant with this but then switched to Keflex 1 day ago by his endocrinologist.  He has had continued increasing swelling and pain since that time and one of the sites burst in the shower with purulent drainage today.  He states his pain currently is about 3 or 4 out of 10 improved from about a 7 out of 10 prior to receiving pain medication.  Reports some intermittent diarrhea for the past month.  He denies chills, chest pain, shortness of breath, nausea, vomiting, constipation.  He states he is adamant about continue to use his insulin pump while inpatient and has signed paperwork to allow him to do this in the past.  ED Course: Vital signs in the ED significant for fever to 100.5.  Lab work-up showed CMP with creatinine 2.1 up from a baseline of 1.4.  Glucose 155, calcium 10.4 which is stable, albumin 3.2, ALT 56.  CBC showed hemoglobin 12.1 and WBCs 10.1.  Lactic acid normal on first check with repeat pending.  Urinalysis showed glucose hemoglobin protein which are stable.  Respiratory panel for flu and COVID is pending.  Imaging showed a CT abdomen  pelvis with abdominal wall subcutaneous stranding with skin thickening consistent with cellulitis with some focal areas noted but no walled off abscesses.  Patient started on vancomycin in the ED.  Review of Systems: As per HPI otherwise all other systems reviewed and are negative.  Past Medical History:  Diagnosis Date  . Abnormal cardiovascular stress test 02/06/2013  . Anemia    occasional - no problems currently(10/02/2011)  . Bilateral renal cysts 06/16/2011   states no known problems  . Blood transfusion without reported diagnosis   . CAD (coronary artery disease), LAD 90%, 1st diag 95%, LCX 70% wth AV groove 90%, RCA 40-50% mid and 80% long distal stenosis 02/03/13 02/04/2013  . Chest pain    positive Myoview stress test  . CHF (congestive heart failure) ([place])   . Coronary artery calcification seen on CAT scan   . Diabetes mellitus    IDDM  . Fatty liver disease, nonalcoholic [id number]  . Hyperlipidemia   . Hypertension    states no dx. of HTN, takes med. to protect kidneys due to DM  . Lateral meniscus tear 09/2011   left  . Loose body in knee 09/2011   loose bodies left knee  . Non Hodgkin's lymphoma ([place]) 1991  . Pancreatitis    occasional - last episode 06/2011  . S/P CABG x 4, 02/05/13 LIMA-LAD; LT. RADIAL-OM;VG-DIAG; VG-PDA 02/06/2013   10/18 3/4 patent grafts (occluded SVG-->Diag), PCI/DESx 3 SVG-->RCA, normal EF  . Splenomegaly, congestive,  chronic   . Stuffy and runny nose 10/02/2011   yellow drainage from nose    Past Surgical History:  Procedure Laterality Date  . ABDOMINAL AORTIC ANEURYSM REPAIR    . ANTERIOR CERVICAL DECOMP/DISCECTOMY FUSION N/A 03/26/2018   Procedure: ANTERIOR CERVICAL DECOMPRESSION FUSION CERVICAL 5-6 WITH INSTRUMENTATION AND ALLOGRAFT;  Surgeon: Phylliss Bob, MD;  Location: [place];  Service: Orthopedics;  Laterality: N/A;  . CORONARY ARTERY BYPASS GRAFT N/A 02/05/2013   Procedure: CORONARY ARTERY BYPASS GRAFTING (CABG);  Surgeon: Ivin Poot, MD;  Location: [place];  Service: Open Heart Surgery;  Laterality: N/A;  Coronary artery bypass graft on pump times four using left internal mammary artery and right greater saphenous vein via endovein harvest and left radial artery harvest.   . CORONARY STENT INTERVENTION  12/17/2016    PCI and drug-eluting stenting of the mid and distal RCA SVG   . CORONARY STENT INTERVENTION N/A 12/17/2016   Procedure: CORONARY STENT INTERVENTION;  Surgeon: Lorretta Harp, MD;  Location: [facility] [facility];  Service: Cardiovascular;  Laterality: N/A;  . ELBOW SURGERY    . HERNIA REPAIR     inguinal   . herniated disc    . INTRAOPERATIVE TRANSESOPHAGEAL ECHOCARDIOGRAM N/A 02/05/2013   Procedure: INTRAOPERATIVE TRANSESOPHAGEAL ECHOCARDIOGRAM;  Surgeon: Ivin Poot, MD;  Location: [facility];  Service: Open Heart Surgery;  Laterality: N/A;  . KNEE ARTHROSCOPY  10/09/2011   Procedure: ARTHROSCOPY KNEE;  Surgeon: Nita Sells, MD;  Location: [place];  Service: Orthopedics;  Laterality: Left;  . LEFT HEART CATH AND CORS/GRAFTS ANGIOGRAPHY N/A 12/17/2016   Procedure: LEFT HEART CATH AND CORS/GRAFTS ANGIOGRAPHY;  Surgeon: Lorretta Harp, MD;  Location: [place] [facility];  Service: Cardiovascular;  Laterality: N/A;  . LEFT HEART CATHETERIZATION WITH CORONARY ANGIOGRAM N/A 02/03/2013   Procedure: LEFT HEART CATHETERIZATION WITH CORONARY ANGIOGRAM;  Surgeon: Lorretta Harp, MD;  Location[facility] C[facility];  Service: Cardiovascular;  Laterality: N/A;  . NASAL SEPTUM SURGERY    . RADIAL ARTERY HARVEST Left 02/05/2013   Procedure: RADIAL ARTERY HARVEST;  Surgeon: Ivin Poot, MD;  Location: [place];  Service: Vascular;  Laterality: Left;  . RIGHT/LEFT HEART CATH AND CORONARY/GRAFT ANGIOGRAPHY N/A 02/15/2020   Procedure: RIGHT/LEFT HEART CATH AND CORONARY/GRAFT ANGIOGRAPHY;  Surgeon: Lorretta Harp, MD;  Location: [place] [facility];  Service: Cardiovascular;  Laterality: N/A;  . TEE  WITHOUT CARDIOVERSION N/A 02/18/2020   Procedure: TRANSESOPHAGEAL ECHOCARDIOGRAM (TEE);  Surgeon: Jolaine Artist, MD;  Location: [facility] ENDOSCOPY;  Service: Cardiovascular;  Laterality: N/A;  . TIBIA BONE BIOPSY  x 3   left  . TONSILLECTOMY      Social History  reports that he has never smoked. He has never used smokeless tobacco. He reports that he does not drink alcohol and does not use drugs.  Allergies  Allergen Reactions  . Reglan [Metoclopramide] Other (See Comments)    Only can tolerate in low doses, in higher doses it has the opposite effect  . Sglt2 Inhibitors     Cellulitis with Vania Rea and [place]    Family History  Adopted: Yes  Reviewed on admission   Prior to Admission medications   Medication Sig Start Date End Date Taking? Authorizing Provider  aspirin 81 MG chewable tablet Chew 1 tablet (81 mg total) by mouth daily. 12/19/16   Cheryln Manly, NP  clopidogrel (PLAVIX) 75 MG tablet TAKE 1 TABLET BY MOUTH EVERY DAY WITH BREAKFAST 05/23/20  Lorretta Harp, MD  Continuous Blood Gluc Receiver ([place]) [place] See admin instructions. 12/09/19   [provider]  fenofibrate 160 MG tablet Take 160 mg by mouth daily.     [provider]  Glucose Blood (BAYER CONTOUR NEXT TEST VI) glucose testing    [provider]  insulin regular human CONCENTRATED (HUMULIN R) 500 UNIT/ML injection Inject into the skin continuous. Using insulin pump    [provider]  isosorbide mononitrate (IMDUR) 30 MG 24 hr tablet Take 1 tablet (30 mg total) by mouth daily. 11/12/19 05/10/20  Pixie Casino, MD  metFORMIN (GLUCOPHAGE) 1000 MG tablet Take 1,000 mg by mouth 2 (two) times daily with a meal.    [provider]  metoprolol succinate (TOPROL-XL) 25 MG 24 hr tablet Take 3 tablets (75 mg total) by mouth 2 (two) times daily. Take with or immediately following a meal. 06/18/19 06/12/20  Lorretta Harp, MD  nitroGLYCERIN (NITROSTAT) 0.4 MG  SL tablet Place 1 tablet (0.4 mg total) under the tongue every 5 (five) minutes as needed. 06/18/19   Lorretta Harp, MD  Propylene Glycol (SYSTANE BALANCE) 0.6 % SOLN Place 1 drop into both eyes daily as needed (dry eyes).    [provider]  sacubitril-valsartan (ENTRESTO) 24-26 MG Take 1 tablet by mouth 2 (two) times daily. 03/14/20   Bensimhon, [name], MD  spironolactone (ALDACTONE) 25 MG tablet Take 1 tablet (25 mg total) by mouth daily. 05/26/20   Bensimhon, [name], MD  torsemide (DEMADEX) 20 MG tablet Take 40 mg by mouth daily.    [provider]  torsemide (DEMADEX) 20 MG tablet Take 20 mg by mouth as needed. In the evening    [provider]  VASCEPA 1 g capsule TAKE 2 CAPSULES BY MOUTH TWICE A DAY 12/08/19   Lorretta Harp, MD    Physical Exam: Vitals:   07/21/20 1431 07/21/20 1610 07/21/20 1700 07/21/20 1754  BP: 113/67  125/69 121/68  Pulse: 79  82 83  Resp: 18  18 16   Temp: 99.8 F (37.7 C) (!) 100.5 F (38.1 C)  98.9 F (37.2 C)  TempSrc: Oral Oral  Oral  SpO2: 97%  98% 96%  Weight:    105.7 kg  Height:    5\' 11"  (1.803 m)   Physical Exam Constitutional:      General: He is not in acute distress.    Appearance: Normal appearance.  HENT:     Head: Normocephalic and atraumatic.     Mouth/Throat:     Mouth: Mucous membranes are moist.     Pharynx: Oropharynx is clear.  Eyes:     Extraocular Movements: Extraocular movements intact.     Pupils: Pupils are equal, round, and reactive to light.  Cardiovascular:     Rate and Rhythm: Normal rate and regular rhythm.     Pulses: Normal pulses.     Heart sounds: Normal heart sounds.  Pulmonary:     Effort: Pulmonary effort is normal. No respiratory distress.     Breath sounds: Normal breath sounds.  Abdominal:     General: Bowel sounds are normal. There is no distension.     Palpations: Abdomen is soft.     Tenderness: There is no abdominal tenderness.  Musculoskeletal:        General:  No swelling or deformity.     Right lower leg: Edema present.     Left lower leg: Edema present.  Skin:  General: Skin is warm and dry.     Comments: Erythema and edema left lower quadrant right lower quadrant CT pictures.  Neurological:     General: No focal deficit present.     Mental Status: Mental status is at baseline.    Labs on Admission: I have personally reviewed following labs and imaging studies  CBC: Recent Labs  Lab 07/21/20 1104  WBC 10.1  NEUTROABS 7.0  HGB 12.1*  HCT 38.0*  MCV 81.4  PLT 952    Basic Metabolic Panel: Recent Labs  Lab 07/21/20 1104  NA 135  K 4.7  CL 102  CO2 24  GLUCOSE 155*  BUN 37*  CREATININE 2.01*  CALCIUM 10.4*    GFR: Estimated Creatinine Clearance: 52.6 mL/min (A) (by C-G formula based on SCr of 2.01 mg/dL (H)).  Liver Function Tests: Recent Labs  Lab 07/21/20 1104  AST 39  ALT 56*  ALKPHOS 50  BILITOT 0.7  PROT 7.4  ALBUMIN 3.2*    Urine analysis:    Component Value Date/Time   COLORURINE YELLOW 07/21/2020 1136   APPEARANCEUR CLEAR 07/21/2020 1136   LABSPEC 1.011 07/21/2020 1136   PHURINE 5.0 07/21/2020 1136   GLUCOSEU 50 (A) 07/21/2020 1136   HGBUR SMALL (A) 07/21/2020 1136   [place] 07/21/2020 [place] 07/21/2020 1136   PROTEINUR 100 (A) 07/21/2020 1136   UROBILINOGEN 0.2 02/04/2013 1430   NITRITE NEGATIVE 07/21/2020 1136   LEUKOCYTESUR NEGATIVE 07/21/2020 1136    Radiological Exams on Admission: CT ABDOMEN PELVIS WO CONTRAST  Result Date: 07/21/2020 CLINICAL DATA:  Evaluate for abdominal abscess or infection. EXAM: CT ABDOMEN AND PELVIS WITHOUT CONTRAST TECHNIQUE: Multidetector CT imaging of the abdomen and pelvis was performed following the standard protocol without IV contrast. COMPARISON:  02/04/2012. FINDINGS: Lower chest: No acute abnormality. Hepatobiliary: Hypertrophy of the caudate lobe and lateral segment of left hepatic lobe identified suggesting early  cirrhosis. No focal liver abnormality is seen. No gallstones, gallbladder wall thickening, or biliary dilatation. Pancreas: Fluid density structure within the distal tail of pancreas is again noted measuring 1.5 cm. This is decreased from 2.5 cm in 2013. Incompletely characterized without IV contrast, this may represent sequelae of prior pancreatitis. Spleen: The spleen is enlarged measuring 17.0 by 17.9 by 7.9 cm (volume = 1300 cm^3) Adrenals/Urinary Tract: Normal appearance of the adrenal glands. Bilateral kidney cysts are incompletely characterized without IV contrast. The largest arises from the lateral aspect of the interpolar right kidney measuring 4.7 cm, image 44/3. Urinary bladder unremarkable. Stomach/Bowel: Stomach is nondistended. The appendix is not confidently identified separate from the right lower quadrant bowel loops. No signs of bowel wall thickening, inflammation or distension. Vascular/Lymphatic: Aortic atherosclerosis. Left upper quadrant gastric and splenic varices are identified. No abdominopelvic adenopathy. Reproductive: Prostate is unremarkable. Other: No free fluid or fluid collections within the abdomen or pelvis. Fat containing left inguinal hernia. Musculoskeletal: There is diffuse subcutaneous soft tissue stranding involving the ventral abdominal wall and bilateral lateral walls. Marked overlying skin thickening is identified with focal areas in the right lower quadrant and left lower quadrant which measures up to 2.9 cm, image 65/3 and image 65/3. Within the limitations of unenhanced technique there are no focal fluid collections identified. IMPRESSION: 1. Diffuse subcutaneous soft tissue stranding involving the ventral abdominal wall and bilateral lateral walls. Marked overlying skin thickening is identified. There are 2 areas of more focal skin thickening within the right lower quadrant and left lower quadrant of the abdomen.  Imaging findings are consistent with cellulitis. Within  the limitations of unenhanced technique there are no focal fluid collections identified. 2. Morphologic features of the liver compatible with early cirrhosis. 3. Splenomegaly and left upper quadrant gastric and splenic varices compatible with portal venous hypertension. 4. Aortic atherosclerosis. Aortic Atherosclerosis (ICD10-I70.0). Electronically Signed   By: Kerby Moors M.D.   On: 07/21/2020 17:39   EKG: Independently reviewed.  Normal sinus rhythm at 84 bpm.  Right bundle branch block.  Nonspecific T wave changes at the anterior leads.  Similar to previous.  Assessment/Plan Principal Problem:   Cellulitis Active Problems:   Dyslipidemia   Acute kidney injury (HCC)   Type 2 diabetes mellitus treated with insulin (HCC)   S/P CABG x 4   Chronic nonalcoholic liver disease   CAD (coronary artery disease)   HTN (hypertension)  Cellulitis > Patient with ongoing skin and soft tissue infection in his right and left lower quadrant at the sites of his insulin pump placement going on for last week. > Has failed Bactrim therapy and was switched to Keflex yesterday.  Continue to worsen.  Will benefit from IV antibiotic therapy. > CT showing changes consistent with cellulitis but no formally walled off abscesses.  Does have sites with purulent drainage however. > Fever to 100.5 in the ED, creatinine elevated to 2.1 from baseline of 1.4, WBCs 10.1. - Continue vancomycin - Trend fever curve and white count  AKI > Patient presented with creatinine 2.1 up from baseline of 1.4. > Checking BNP to help eval if related to CHF - Avoid nephrotoxic agents - Trend renal function and electrolytes  CHF Hypertension > History of systolic and diastolic CHF > Last echo [id number] with EF 50-55%, moderately reduced RV systolic function. > Edema on exam but he states this is typical for him, no rales, but note does have moderate reduced RV function. - Monitor on telemetry - Continue home Imdur, metoprolol,  Entresto, spironolactone - Continued home torsemide for now - Check BNP  CAD Hyperlipidemia > Status post CABG x4 - Continue home metoprolol, Entresto, Imdur, aspirin, Plavix, as needed nitro - Continue home fenofibrate  Diabetes > Having getting continuous glucose at 1.7 to 2.5 units/h of U500 > Patient is adamant that he continues on his insulin pump while admitted and states he is signed paperwork allowing him to do so in the past - Continue with home insulin pump per patient wishes - We will implement glucose checks in case his pump is not reading.  DVT prophylaxis: Lovenox  Code Status:   Full  Family Communication:  Family updated at bedside Disposition Plan:   Patient is from:  Home  Anticipated DC to:  Home  Anticipated DC date:  1 to 3 days  Anticipated DC barriers: None  Consults called:  None  Admission status:  Observation, telemetry   Severity of Illness: The appropriate patient status for this patient is OBSERVATION. Observation status is judged to be reasonable and necessary in order to provide the required intensity of service to ensure the patient's safety. The patient's presenting symptoms, physical exam findings, and initial radiographic and laboratory data in the context of their medical condition is felt to place them at decreased risk for further clinical deterioration. Furthermore, it is anticipated that the patient will be medically stable for discharge from the hospital within 2 midnights of admission. The following factors support the patient status of observation.   " The patient's presenting symptoms include abdominal pain, skin changes consistent  with cellulitis not responding to outpatient antibiotics. " The physical exam findings include abdominal wall cellulitis with redness and edema, lower extremity edema. " The initial radiographic and laboratory data are creatinine elevated 2.1 previously 1.4, calcium 10.4, K3.2, ALT 56, WBC 2.1, hemoglobin 12.1.   Lactic acid normal with repeat pending.  CT of the pelvis with upper abdominal wall subcu stranding and skin thickening consistent with cellulitis but no focal abscess noted.   Marcelyn Bruins MD [facility]  How to contact the [facility] Attending or Consulting provider [place] or covering provider during after hours [place], for this patient?   1. Check the care team in [facility] and look for a) attending/consulting [facility] provider listed and b) the [facility] team listed 2. Log into [email or web address] and use [place]'s universal password to access. If you do not have the password, please contact the hospital operator. 3. Locate the [facility] provider you are looking for under [facility] and page to a number that you can be directly reached. 4. If you still have difficulty reaching the provider, please page the [facility] (Director on Call) for the Hospitalists listed on amion for assistance.  07/21/2020, 6:41 PM

## 2020-07-21 NOTE — ED Notes (Signed)
Patient transported to CT 

## 2020-07-22 DIAGNOSIS — Z7984 Long term (current) use of oral hypoglycemic drugs: Secondary | ICD-10-CM | POA: Diagnosis not present

## 2020-07-22 DIAGNOSIS — I110 Hypertensive heart disease with heart failure: Secondary | ICD-10-CM | POA: Diagnosis present

## 2020-07-22 DIAGNOSIS — Y828 Other medical devices associated with adverse incidents: Secondary | ICD-10-CM | POA: Diagnosis present

## 2020-07-22 DIAGNOSIS — Z981 Arthrodesis status: Secondary | ICD-10-CM | POA: Diagnosis not present

## 2020-07-22 DIAGNOSIS — T8572XA Infection and inflammatory reaction due to insulin pump, initial encounter: Secondary | ICD-10-CM | POA: Diagnosis present

## 2020-07-22 DIAGNOSIS — I2510 Atherosclerotic heart disease of native coronary artery without angina pectoris: Secondary | ICD-10-CM | POA: Diagnosis present

## 2020-07-22 DIAGNOSIS — E119 Type 2 diabetes mellitus without complications: Secondary | ICD-10-CM | POA: Diagnosis present

## 2020-07-22 DIAGNOSIS — E785 Hyperlipidemia, unspecified: Secondary | ICD-10-CM | POA: Diagnosis present

## 2020-07-22 DIAGNOSIS — K760 Fatty (change of) liver, not elsewhere classified: Secondary | ICD-10-CM | POA: Diagnosis present

## 2020-07-22 DIAGNOSIS — Z7982 Long term (current) use of aspirin: Secondary | ICD-10-CM | POA: Diagnosis not present

## 2020-07-22 DIAGNOSIS — Z20822 Contact with and (suspected) exposure to covid-19: Secondary | ICD-10-CM | POA: Diagnosis present

## 2020-07-22 DIAGNOSIS — Z9641 Presence of insulin pump (external) (internal): Secondary | ICD-10-CM | POA: Diagnosis present

## 2020-07-22 DIAGNOSIS — K861 Other chronic pancreatitis: Secondary | ICD-10-CM | POA: Diagnosis present

## 2020-07-22 DIAGNOSIS — Z79899 Other long term (current) drug therapy: Secondary | ICD-10-CM | POA: Diagnosis not present

## 2020-07-22 DIAGNOSIS — L03311 Cellulitis of abdominal wall: Secondary | ICD-10-CM | POA: Diagnosis present

## 2020-07-22 DIAGNOSIS — I5042 Chronic combined systolic (congestive) and diastolic (congestive) heart failure: Secondary | ICD-10-CM | POA: Diagnosis not present

## 2020-07-22 DIAGNOSIS — Z888 Allergy status to other drugs, medicaments and biological substances status: Secondary | ICD-10-CM | POA: Diagnosis not present

## 2020-07-22 DIAGNOSIS — Z794 Long term (current) use of insulin: Secondary | ICD-10-CM | POA: Diagnosis not present

## 2020-07-22 DIAGNOSIS — C8590 Non-Hodgkin lymphoma, unspecified, unspecified site: Secondary | ICD-10-CM | POA: Diagnosis present

## 2020-07-22 DIAGNOSIS — Z951 Presence of aortocoronary bypass graft: Secondary | ICD-10-CM | POA: Diagnosis not present

## 2020-07-22 DIAGNOSIS — Z955 Presence of coronary angioplasty implant and graft: Secondary | ICD-10-CM | POA: Diagnosis not present

## 2020-07-22 DIAGNOSIS — N179 Acute kidney failure, unspecified: Secondary | ICD-10-CM | POA: Diagnosis present

## 2020-07-22 LAB — CBC
HCT: 38.7 % — ABNORMAL LOW (ref 39.0–52.0)
Hemoglobin: 12.4 g/dL — ABNORMAL LOW (ref 13.0–17.0)
MCH: 25.9 pg — ABNORMAL LOW (ref 26.0–34.0)
MCHC: 32 g/dL (ref 30.0–36.0)
MCV: 81 fL (ref 80.0–100.0)
Platelets: 137 10*3/uL — ABNORMAL LOW (ref 150–400)
RBC: 4.78 MIL/uL (ref 4.22–5.81)
RDW: 15.6 % — ABNORMAL HIGH (ref 11.5–15.5)
WBC: 9.7 10*3/uL (ref 4.0–10.5)
nRBC: 0 % (ref 0.0–0.2)

## 2020-07-22 LAB — COMPREHENSIVE METABOLIC PANEL
ALT: 59 U/L — ABNORMAL HIGH (ref 0–44)
AST: 42 U/L — ABNORMAL HIGH (ref 15–41)
Albumin: 3 g/dL — ABNORMAL LOW (ref 3.5–5.0)
Alkaline Phosphatase: 51 U/L (ref 38–126)
Anion gap: 7 (ref 5–15)
BUN: 42 mg/dL — ABNORMAL HIGH (ref 6–20)
CO2: 27 mmol/L (ref 22–32)
Calcium: 10.3 mg/dL (ref 8.9–10.3)
Chloride: 100 mmol/L (ref 98–111)
Creatinine, Ser: 1.95 mg/dL — ABNORMAL HIGH (ref 0.61–1.24)
GFR, Estimated: 40 mL/min — ABNORMAL LOW (ref >60)
Glucose, Bld: 150 mg/dL — ABNORMAL HIGH (ref 70–99)
Potassium: 4.3 mmol/L (ref 3.5–5.1)
Sodium: 134 mmol/L — ABNORMAL LOW (ref 135–145)
Total Bilirubin: 0.9 mg/dL (ref 0.3–1.2)
Total Protein: 7.7 g/dL (ref 6.5–8.1)

## 2020-07-22 MED ORDER — CYCLOBENZAPRINE HCL 10 MG PO TABS
10.0000 mg | ORAL_TABLET | Freq: Three times a day (TID) | ORAL | Status: DC | PRN
  Administered 2020-07-22: 10 mg via ORAL
  Filled 2020-07-22: qty 1

## 2020-07-22 MED ORDER — TRAMADOL HCL 50 MG PO TABS
50.0000 mg | ORAL_TABLET | Freq: Four times a day (QID) | ORAL | Status: DC | PRN
  Administered 2020-07-22 – 2020-07-23 (×4): 50 mg via ORAL
  Filled 2020-07-22 (×4): qty 1

## 2020-07-22 NOTE — Plan of Care (Signed)

## 2020-07-22 NOTE — Plan of Care (Signed)
  Problem: Health Behavior/Discharge Planning: Goal: Ability to manage health-related needs will improve Outcome: Progressing   Problem: Clinical Measurements: Goal: Ability to maintain clinical measurements within normal limits will improve Outcome: Progressing Goal: Will remain free from infection Outcome: Progressing   

## 2020-07-22 NOTE — Progress Notes (Signed)
Patient ID: Juan [name], male   DOB: July 09, 1965, 55 y.o.   MRN: [id number]  PROGRESS NOTE    Juan [name]  YHC:[id number] DOB: [birth year] DOA: 07/21/2020 PCP: Leeroy Cha, MD    Brief Narrative:  Juan [name] is a 55 y.o. male with medical history significant of heart failure, CAD, nonalcoholic fatty liver disease, chronic pancreatitis, hyperlipidemia, hypertension, lymphoma, diabetes who presents with ongoing abdominal pain and skin infection.This issue for started about a week ago when he was using his insulin pump in his abdomen.  He noticed he had some redness around the site of his right lower quadrant and then switch it to his left lower quadrant.  He did notice continued swelling and redness at the sites and was seen by his PCP 3 days ago started him on Bactrim.  He was compliant with this but then switched to Keflex 1 day ago by his endocrinologist.  He has had continued increasing swelling and pain since that time and one of the sites burst in the shower with purulent drainage today.  He states his pain currently is about 3 or 4 out of 10 improved from about a 7 out of 10 prior to receiving pain medication. Given Vanc in ED once CT did not show obvious abscess.   Assessment & Plan:   Principal Problem:   Cellulitis Active Problems:   Dyslipidemia   Acute kidney injury ([place])   Type 2 diabetes mellitus treated with insulin (H[facility])   S/P CABG x 4   Chronic nonalcoholic liver disease   CAD (coronary artery disease)   HTN (hypertension)   Chronic combined systolic and diastolic CHF (congestive heart failure) (HCC)   Cellulitis > Patient with ongoing skin and soft tissue infection in his right and left lower quadrant at the sites of his insulin pump placement going on for last week. > Has failed Bactrim therapy and was switched to Keflex yesterday.  Continue to worsen.  Will benefit from IV antibiotic therapy. > CT showing changes consistent with cellulitis but no formally  walled off abscesses.  Does have sites with purulent drainage however. > Fever to 100.5 in the ED, creatinine elevated to 2.1 from baseline of 1.4, WBCs 10.1. - Continue vancomycin--check MRSA colonization - Trend fever curve and white count  AKI > Patient presented with creatinine 2.1 up from baseline of 1.4. > Checking BNP to help eval if related to CHF - Avoid nephrotoxic agents - Trend renal function and electrolytes  CHF Hypertension > History of systolic and diastolic CHF > Last echo 08/03/6158 with EF 50-55%, moderately reduced RV systolic function. > Edema on exam but he states this is typical for him, no rales, but note does have moderate reduced RV function. - Monitor on telemetry - Continue Imdur, metoprolol, Entresto, spironolactone - Continued home torsemide for now - Check BNP  CAD Hyperlipidemia > Status post CABG x4 - Continue metoprolol, Entresto, Imdur, aspirin, Plavix, as needed nitro - Continue home fenofibrate  Diabetes > Having getting continuous glucose at 1.7 to 2.5 units/h of U500 > Patient will continue home insulin pump whileadmitted and states he is signed paperwork allowing him to do so in the past - Continue with home insulin pump per patient wishes   DVT prophylaxis: Lovenox SQ Code Status: Full code  Family Communication: patient at bedside Disposition Plan: home once we start seeing improvement. Patient remains inpatient due to IV antibiotic use.  Patient would really like to go home tomorrow if things look  better and pending MRSA result for guidance as to which antibiotic he should be discharged home on.  Consultants:   None  Procedures:  None   Antimicrobials: Anti-infectives (From admission, onward)   Start     Dose/Rate Route Frequency Ordered Stop   07/22/20 1000  vancomycin (VANCOREADY) IVPB 1000 mg/200 mL        1,000 mg 200 mL/hr over 60 Minutes Intravenous Every 24 hours 07/21/20 2010     07/21/20 1615  vancomycin  (VANCOREADY) IVPB 1500 mg/300 mL        1,500 mg 150 mL/hr over 120 Minutes Intravenous  Once 07/21/20 1610 07/21/20 1846       Subjective: Does not feel much improvement today--wonders if his Vanc is being dosed correctly.  Objective: Vitals:   07/21/20 2141 07/21/20 2159 07/22/20 0510 07/22/20 0844  BP: 117/72 131/78 118/69 131/77  Pulse: 84 85 83 90  Resp: 12 18 17 18   Temp: 99 F (37.2 C) 98.8 F (37.1 C) (!) 100.5 F (38.1 C) 98.9 F (37.2 C)  TempSrc: Oral Oral Oral Oral  SpO2: 99% 99% 98% 96%  Weight:      Height:        Intake/Output Summary (Last 24 hours) at 07/22/2020 1109 Last data filed at 07/22/2020 0100 Gross per 24 hour  Intake 280.51 ml  Output 875 ml  Net -594.49 ml   Filed Weights   07/21/20 1754  Weight: 105.7 kg    Examination:  General exam: Appears calm and comfortable  Respiratory system: Clear to auscultation. Respiratory effort normal. Cardiovascular system: S1 & S2 heard, RRR.  Gastrointestinal system: Abdomen is nondistended, soft and nontender.  Central nervous system: Alert and oriented. No focal neurological deficits. Extremities: Symmetric  Skin: Erythema noted in LLQ, ? Slightly worse than before, dressing on right site Psychiatry: Judgement and insight appear normal. Mood & affect appropriate.     Data Reviewed: I have personally reviewed following labs and imaging studies  CBC: Recent Labs  Lab 07/21/20 1104 07/22/20 0145  WBC 10.1 9.7  NEUTROABS 7.0  --   HGB 12.1* 12.4*  HCT 38.0* 38.7*  MCV 81.4 81.0  PLT 158 0000000*   Basic Metabolic Panel: Recent Labs  Lab 07/21/20 1104 07/21/20 2106 07/22/20 0145  NA 135  --  134*  K 4.7  --  4.3  CL 102  --  100  CO2 24  --  27  GLUCOSE 155*  --  150*  BUN 37*  --  42*  CREATININE 2.01*  --  1.95*  CALCIUM 10.4*  --  10.3  MG  --  2.1  --    GFR: Estimated Creatinine Clearance: 54.2 mL/min (A) (by C-G formula based on SCr of 1.95 mg/dL (H)). Liver Function  Tests: Recent Labs  Lab 07/21/20 1104 07/22/20 0145  AST 39 42*  ALT 56* 59*  ALKPHOS 50 51  BILITOT 0.7 0.9  PROT 7.4 7.7  ALBUMIN 3.2* 3.0*   Sepsis Labs: Recent Labs  Lab 07/21/20 1136  LATICACIDVEN 1.7    Recent Results (from the past 240 hour(s))  Resp Panel by RT-PCR (Flu A&B, Covid) Nasopharyngeal Swab     Status: None   Collection Time: 07/21/20  6:21 PM   Specimen: Nasopharyngeal Swab; Nasopharyngeal(NP) swabs in vial transport medium  Result Value Ref Range Status   SARS Coronavirus 2 by RT PCR NEGATIVE NEGATIVE Final    Comment: (NOTE) SARS-CoV-2 target nucleic acids are NOT DETECTED.  The SARS-CoV-2  RNA is generally detectable in upper respiratory specimens during the acute phase of infection. The lowest concentration of SARS-CoV-2 viral copies this assay can detect is 138 copies/mL. A negative result does not preclude SARS-Cov-2 infection and should not be used as the sole basis for treatment or other patient management decisions. A negative result may occur with  improper specimen collection/handling, submission of specimen other than nasopharyngeal swab, presence of viral mutation(s) within the areas targeted by this assay, and inadequate number of viral copies(<138 copies/mL). A negative result must be combined with clinical observations, patient history, and epidemiological information. The expected result is Negative.  Fact Sheet for Patients:  EntrepreneurPulse.com.au  Fact Sheet for Healthcare Providers:  IncredibleEmployment.be  This test is no t yet approved or cleared by the [place] FDA and  has been authorized for detection and/or diagnosis of SARS-CoV-2 by FDA under an Emergency Use Authorization (EUA). This EUA will remain  in effect (meaning this test can be used) for the duration of the COVID-19 declaration under Section 564(b)(1) of the Act, 21 U.S.C.section 360bbb-3(b)(1), unless the  authorization is terminated  or revoked sooner.       Influenza A by PCR NEGATIVE NEGATIVE Final   Influenza B by PCR NEGATIVE NEGATIVE Final    Comment: (NOTE) The Xpert Xpress SARS-CoV-2/FLU/RSV plus assay is intended as an aid in the diagnosis of influenza from Nasopharyngeal swab specimens and should not be used as a sole basis for treatment. Nasal washings and aspirates are unacceptable for Xpert Xpress SARS-CoV-2/FLU/RSV testing.  Fact Sheet for Patients: EntrepreneurPulse.com.au  Fact Sheet for Healthcare Providers: IncredibleEmployment.be  This test is not yet approved or cleared by the [place] FDA and has been authorized for detection and/or diagnosis of SARS-CoV-2 by FDA under an Emergency Use Authorization (EUA). This EUA will remain in effect (meaning this test can be used) for the duration of the COVID-19 declaration under Section 564(b)(1) of the Act, 21 U.S.C. section 360bbb-3(b)(1), unless the authorization is terminated or revoked.  Performed at [facility], [place] [address], [place], [place] [address]       Radiology Studies: CT ABDOMEN PELVIS WO CONTRAST  Result Date: 07/21/2020 CLINICAL DATA:  Evaluate for abdominal abscess or infection. EXAM: CT ABDOMEN AND PELVIS WITHOUT CONTRAST TECHNIQUE: Multidetector CT imaging of the abdomen and pelvis was performed following the standard protocol without IV contrast. COMPARISON:  02/04/2012. FINDINGS: Lower chest: No acute abnormality. Hepatobiliary: Hypertrophy of the caudate lobe and lateral segment of left hepatic lobe identified suggesting early cirrhosis. No focal liver abnormality is seen. No gallstones, gallbladder wall thickening, or biliary dilatation. Pancreas: Fluid density structure within the distal tail of pancreas is again noted measuring 1.5 cm. This is decreased from 2.5 cm in 2013. Incompletely characterized without IV contrast, this may represent sequelae  of prior pancreatitis. Spleen: The spleen is enlarged measuring 17.0 by 17.9 by 7.9 cm (volume = 1300 cm^3) Adrenals/Urinary Tract: Normal appearance of the adrenal glands. Bilateral kidney cysts are incompletely characterized without IV contrast. The largest arises from the lateral aspect of the interpolar right kidney measuring 4.7 cm, image 44/3. Urinary bladder unremarkable. Stomach/Bowel: Stomach is nondistended. The appendix is not confidently identified separate from the right lower quadrant bowel loops. No signs of bowel wall thickening, inflammation or distension. Vascular/Lymphatic: Aortic atherosclerosis. Left upper quadrant gastric and splenic varices are identified. No abdominopelvic adenopathy. Reproductive: Prostate is unremarkable. Other: No free fluid or fluid collections within the abdomen or pelvis. Fat containing left  inguinal hernia. Musculoskeletal: There is diffuse subcutaneous soft tissue stranding involving the ventral abdominal wall and bilateral lateral walls. Marked overlying skin thickening is identified with focal areas in the right lower quadrant and left lower quadrant which measures up to 2.9 cm, image 65/3 and image 65/3. Within the limitations of unenhanced technique there are no focal fluid collections identified. IMPRESSION: 1. Diffuse subcutaneous soft tissue stranding involving the ventral abdominal wall and bilateral lateral walls. Marked overlying skin thickening is identified. There are 2 areas of more focal skin thickening within the right lower quadrant and left lower quadrant of the abdomen. Imaging findings are consistent with cellulitis. Within the limitations of unenhanced technique there are no focal fluid collections identified. 2. Morphologic features of the liver compatible with early cirrhosis. 3. Splenomegaly and left upper quadrant gastric and splenic varices compatible with portal venous hypertension. 4. Aortic atherosclerosis. Aortic Atherosclerosis  (ICD10-I70.0). Electronically Signed   By: Kerby Moors M.D.   On: 07/21/2020 17:39     Scheduled Meds: . aspirin  81 mg Oral Daily  . clopidogrel  75 mg Oral Daily  . enoxaparin (LOVENOX) injection  40 mg Subcutaneous Q24H  . fenofibrate  160 mg Oral Daily  . isosorbide mononitrate  30 mg Oral Daily  . metoprolol succinate  75 mg Oral BID  . sacubitril-valsartan  1 tablet Oral BID  . sodium chloride flush  3 mL Intravenous Q12H  . spironolactone  25 mg Oral Daily  . torsemide  40 mg Oral Daily   Continuous Infusions: . vancomycin 1,000 mg (07/22/20 1016)     LOS: 0 days    Donnamae Jude, MD 07/22/2020 11:09 AM [phone] [facility] If 7PM-7AM, please contact night-coverage 07/22/2020, 11:09 AM

## 2020-07-23 DIAGNOSIS — I5042 Chronic combined systolic (congestive) and diastolic (congestive) heart failure: Secondary | ICD-10-CM

## 2020-07-23 DIAGNOSIS — I2510 Atherosclerotic heart disease of native coronary artery without angina pectoris: Secondary | ICD-10-CM

## 2020-07-23 DIAGNOSIS — K769 Liver disease, unspecified: Secondary | ICD-10-CM

## 2020-07-23 DIAGNOSIS — L03311 Cellulitis of abdominal wall: Secondary | ICD-10-CM

## 2020-07-23 DIAGNOSIS — N179 Acute kidney failure, unspecified: Secondary | ICD-10-CM

## 2020-07-23 LAB — BASIC METABOLIC PANEL
Anion gap: 7 (ref 5–15)
BUN: 37 mg/dL — ABNORMAL HIGH (ref 6–20)
CO2: 28 mmol/L (ref 22–32)
Calcium: 10.1 mg/dL (ref 8.9–10.3)
Chloride: 99 mmol/L (ref 98–111)
Creatinine, Ser: 1.95 mg/dL — ABNORMAL HIGH (ref 0.61–1.24)
GFR, Estimated: 40 mL/min — ABNORMAL LOW (ref >60)
Glucose, Bld: 119 mg/dL — ABNORMAL HIGH (ref 70–99)
Potassium: 4.7 mmol/L (ref 3.5–5.1)
Sodium: 134 mmol/L — ABNORMAL LOW (ref 135–145)

## 2020-07-23 MED ORDER — AMOXICILLIN-POT CLAVULANATE 875-125 MG PO TABS: 1.0000 | ORAL_TABLET | Freq: Two times a day (BID) | ORAL | 0 refills | Status: DC

## 2020-07-23 MED ORDER — DOXYCYCLINE HYCLATE 100 MG PO TABS: 100.0000 mg | ORAL_TABLET | Freq: Two times a day (BID) | ORAL | 0 refills | Status: DC

## 2020-07-23 NOTE — Progress Notes (Signed)
Discharge instructions provided.  Patient and sig other verbalize understanding of all instructions and follow-up care.  Dressing change supplies provided.  Patient discharged to home with all belongings via wheelchair by NT.

## 2020-07-23 NOTE — Discharge Summary (Signed)
Physician Discharge Summary  Juan [name] WHQ:[id number] DOB: 28-Jun-1965 DOA: 07/21/2020  PCP: Leeroy Cha, MD  Admit date: 07/21/2020 Discharge date: 07/23/2020  Admitted From: home Disposition:  home  Recommendations for Outpatient Follow-up:  1. Follow up with PCP in 1-2 weeks  Home Health: none Equipment/Devices: none  Discharge Condition: stable CODE STATUS: Full code Diet recommendation: diabetic   HPI: Per admitting MD, Juan [name] is a 55 y.o. male with medical history significant of heart failure, CAD, nonalcoholic fatty liver disease, chronic pancreatitis, hyperlipidemia, hypertension, lymphoma, diabetes who presents with ongoing abdominal pain and skin infection. As above patient presents with skin infection and abdominal pain.  This issue for started about a week ago when he was using his insulin pump in his abdomen.  He noticed he had some redness around the site of his right lower quadrant and then switch it to his left lower quadrant.  He did notice continued swelling and redness at the sites and was seen by his PCP 3 days ago started him on Bactrim.  He was compliant with this but then switched to Keflex 1 day ago by his endocrinologist.  He has had continued increasing swelling and pain since that time and one of the sites burst in the shower with purulent drainage today.  He states his pain currently is about 3 or 4 out of 10 improved from about a 7 out of 10 prior to receiving pain medication. Reports some intermittent diarrhea for the past month.  He denies chills, chest pain, shortness of breath, nausea, vomiting, constipation.  He states he is adamant about continue to use his insulin pump while inpatient and has signed paperwork to allow him to do this in the past.  Hospital Course / Discharge diagnoses: Principal problem Cellulitis -Patient with ongoing skin and soft tissue infection in his right and left lower quadrant at the sites of his insulin pump  placement going on for last week.  He was initially on Bactrim therapy but redness increased and he was having worsening pain.  He was briefly placed on Keflex, patient felt like he was improving however there was concern for abscess and came to the hospital.  Imaging on admission did not show any deep abscesses, but he does have mild spontaneous drainage.  Given purulent discharge he will be placed on doxycycline and Augmentin for 7 additional days.  He is improving, afebrile and WBC are normal.  Active problems AKI -Patient presented with creatinine 2.1 up from baseline of 1.4.  Improving and stable.  Repeat BMP in a week. Chronic combined CHF / Hypertension -History of systolic and diastolic CHF. Last echo 07/01/2020 with EF 50-55%, moderately reduced RV systolic function.  He has some degree of chronic lower extremity swelling, patient appreciates he is at his baseline.  Continue home medications. CAD / Hyperlipidemia -Status post CABG x4. No chest pain. Continue home medications Diabetes -on insulin pump.   Sepsis ruled out   Discharge Instructions   Allergies as of 07/23/2020      Reactions   Reglan [metoclopramide] Other (See Comments)   Only can tolerate in low doses, in higher doses it has the opposite effect   Sglt2 Inhibitors    Cellulitis with Vania Rea and Farxiga      Medication List    STOP taking these medications   cephALEXin 500 MG capsule Commonly known as: KEFLEX     TAKE these medications   amoxicillin-clavulanate 875-125 MG tablet Commonly known as: Augmentin Take  1 tablet by mouth every 12 (twelve) hours for 7 days.   aspirin 81 MG chewable tablet Chew 1 tablet (81 mg total) by mouth daily.   BAYER CONTOUR NEXT TEST VI glucose testing   clopidogrel 75 MG tablet Commonly known as: PLAVIX TAKE 1 TABLET BY MOUTH EVERY DAY WITH BREAKFAST What changed: See the new instructions.   Dexcom G6 Receiver Kerrin Mo See admin instructions.   doxycycline 100 MG  tablet Commonly known as: VIBRA-TABS Take 1 tablet (100 mg total) by mouth 2 (two) times daily for 7 days.   fenofibrate 160 MG tablet Take 160 mg by mouth daily.   insulin regular human CONCENTRATED 500 UNIT/ML injection Commonly known as: HUMULIN R Inject into the skin continuous. Using insulin pump   isosorbide mononitrate 30 MG 24 hr tablet Commonly known as: IMDUR Take 1 tablet (30 mg total) by mouth daily.   metFORMIN 1000 MG tablet Commonly known as: GLUCOPHAGE Take 1,000 mg by mouth 2 (two) times daily with a meal.   metoprolol succinate 25 MG 24 hr tablet Commonly known as: TOPROL-XL Take 3 tablets (75 mg total) by mouth 2 (two) times daily. Take with or immediately following a meal.   nitroGLYCERIN 0.4 MG SL tablet Commonly known as: Nitrostat Place 1 tablet (0.4 mg total) under the tongue every 5 (five) minutes as needed. What changed: reasons to take this   sacubitril-valsartan 24-26 MG Commonly known as: ENTRESTO Take 1 tablet by mouth 2 (two) times daily.   spironolactone 25 MG tablet Commonly known as: ALDACTONE Take 1 tablet (25 mg total) by mouth daily.   Systane Balance 0.6 % Soln Generic drug: Propylene Glycol Place 1 drop into both eyes daily as needed (dry eyes).   torsemide 20 MG tablet Commonly known as: DEMADEX Take 40 mg by mouth daily.   Vascepa 1 g capsule Generic drug: icosapent Ethyl TAKE 2 CAPSULES BY MOUTH TWICE A DAY       Follow-up Information    Leeroy Cha, MD. Schedule an appointment as soon as possible for a visit in 1 week(s).   Specialty: Internal Medicine Contact information: [address] [place] [address] [phone]        Lorretta Harp, MD .   Specialties: Cardiology, Radiology Contact information: [address] [place] [address] [phone]               Consultations:  None   Procedures/Studies:  CT ABDOMEN PELVIS WO CONTRAST  Result  Date: 07/21/2020 CLINICAL DATA:  Evaluate for abdominal abscess or infection. EXAM: CT ABDOMEN AND PELVIS WITHOUT CONTRAST TECHNIQUE: Multidetector CT imaging of the abdomen and pelvis was performed following the standard protocol without IV contrast. COMPARISON:  02/04/2012. FINDINGS: Lower chest: No acute abnormality. Hepatobiliary: Hypertrophy of the caudate lobe and lateral segment of left hepatic lobe identified suggesting early cirrhosis. No focal liver abnormality is seen. No gallstones, gallbladder wall thickening, or biliary dilatation. Pancreas: Fluid density structure within the distal tail of pancreas is again noted measuring 1.5 cm. This is decreased from 2.5 cm in 2013. Incompletely characterized without IV contrast, this may represent sequelae of prior pancreatitis. Spleen: The spleen is enlarged measuring 17.0 by 17.9 by 7.9 cm (volume = 1300 cm^3) Adrenals/Urinary Tract: Normal appearance of the adrenal glands. Bilateral kidney cysts are incompletely characterized without IV contrast. The largest arises from the lateral aspect of the interpolar right kidney measuring 4.7 cm, image 44/3. Urinary bladder unremarkable. Stomach/Bowel: Stomach is  nondistended. The appendix is not confidently identified separate from the right lower quadrant bowel loops. No signs of bowel wall thickening, inflammation or distension. Vascular/Lymphatic: Aortic atherosclerosis. Left upper quadrant gastric and splenic varices are identified. No abdominopelvic adenopathy. Reproductive: Prostate is unremarkable. Other: No free fluid or fluid collections within the abdomen or pelvis. Fat containing left inguinal hernia. Musculoskeletal: There is diffuse subcutaneous soft tissue stranding involving the ventral abdominal wall and bilateral lateral walls. Marked overlying skin thickening is identified with focal areas in the right lower quadrant and left lower quadrant which measures up to 2.9 cm, image 65/3 and image 65/3. Within  the limitations of unenhanced technique there are no focal fluid collections identified. IMPRESSION: 1. Diffuse subcutaneous soft tissue stranding involving the ventral abdominal wall and bilateral lateral walls. Marked overlying skin thickening is identified. There are 2 areas of more focal skin thickening within the right lower quadrant and left lower quadrant of the abdomen. Imaging findings are consistent with cellulitis. Within the limitations of unenhanced technique there are no focal fluid collections identified. 2. Morphologic features of the liver compatible with early cirrhosis. 3. Splenomegaly and left upper quadrant gastric and splenic varices compatible with portal venous hypertension. 4. Aortic atherosclerosis. Aortic Atherosclerosis (ICD10-I70.0). Electronically Signed   By: Kerby Moors M.D.   On: 07/21/2020 17:39   ECHOCARDIOGRAM COMPLETE  Result Date: 07/01/2020    ECHOCARDIOGRAM REPORT   Patient Name:   Murrel ISAISH JACKOVICH Date of Exam: 07/01/2020 Medical Rec #:  [id number]     Height:       70.0 in Accession #:    [id number]    Weight:       241.0 lb Date of Birth:  02/25/1966     BSA:          2.259 m Patient Age:    48 years      BP:           130/68 mmHg Patient Gender: M             HR:           85 bpm. Exam Location:  [facility] Procedure: 2D Echo, Cardiac Doppler and Color Doppler Indications:    [id number] Chronic systolic heart failure  History:        Patient has prior history of Echocardiogram examinations, most                 recent 02/18/2020. CHF, CAD, Prior CABG; Risk                 Factors:Hypertension, Dyslipidemia and Diabetes. Chest pain.                 Congestive heart failure. Fatty liver disease. Non-Hodgkins                 Lymphoma.  Sonographer:    Wilford Sports Rodgers-Jones RDCS Referring Phys: [place]  1. Left ventricular ejection fraction, by estimation, is 50 to 55%. The left ventricle has low normal function. The left ventricle demonstrates  regional wall motion abnormalities.         There is akinesis of the basal inferior LV segment and hypokinesis of the basal-to-mid anterolateral LV segments. There is mild concentric left ventricular hypertrophy. Left ventricular diastolic parameters are inderterminant due to E-A fusion.  2. Right ventricular systolic function is moderately reduced. The right ventricular size is moderately enlarged.  3. Left atrial size was mildly dilated.  4. The mitral  valve is normal in structure. Mild mitral valve regurgitation. No evidence of mitral stenosis.  5. The aortic valve is tricuspid. There is mild calcification of the aortic valve. There is mild thickening of the aortic valve. Aortic valve regurgitation is trivial. Mild aortic valve sclerosis is present, with no evidence of aortic valve stenosis.  6. Aortic dilatation noted. There is mild dilatation of the aortic root, measuring 38 mm.  7. The inferior vena cava is normal in size with greater than 50% respiratory variability, suggesting right atrial pressure of 3 mmHg. Comparison(s): Compared to prior echo on 01/29/20, the LVEF has improved slightly to 50-55% with improved wall motion as described above. FINDINGS  Left Ventricle: Left ventricular ejection fraction, by estimation, is 50 to 55%. The left ventricle has low normal function. The left ventricle demonstrates regional wall motion abnormalities. There is akinesis of the basal inferior LV segment and hypokinesis of the basal-to-mid anterolateral LV segments. The left ventricular internal cavity size was normal in size. There is mild concentric left ventricular hypertrophy. Indeterminate diastolic filling due to E-A fusion. Right Ventricle: The right ventricular size is moderately enlarged. Right vetricular wall thickness was not well visualized. Right ventricular systolic function is moderately reduced. Left Atrium: Left atrial size was mildly dilated. Right Atrium: Right atrial size was normal in size.  Pericardium: There is no evidence of pericardial effusion. Mitral Valve: The mitral valve is normal in structure. There is mild thickening of the mitral valve leaflet(s). There is mild calcification of the mitral valve leaflet(s). Mild mitral valve regurgitation. No evidence of mitral valve stenosis. Tricuspid Valve: The tricuspid valve is normal in structure. Tricuspid valve regurgitation is mild. Aortic Valve: The aortic valve is tricuspid. There is mild calcification of the aortic valve. There is mild thickening of the aortic valve. Aortic valve regurgitation is trivial. Mild aortic valve sclerosis is present, with no evidence of aortic valve stenosis. Pulmonic Valve: The pulmonic valve was not well visualized. Pulmonic valve regurgitation is trivial. Aorta: Aortic dilatation noted. There is mild dilatation of the aortic root, measuring 38 mm. Venous: The inferior vena cava is normal in size with greater than 50% respiratory variability, suggesting right atrial pressure of 3 mmHg. IAS/Shunts: No atrial level shunt detected by color flow Doppler.  LEFT VENTRICLE PLAX 2D LVIDd:         4.90 cm  Diastology LVIDs:         3.60 cm  LV e' medial:    5.87 cm/s LV PW:         1.20 cm  LV E/e' medial:  26.9 LV IVS:        0.80 cm  LV e' lateral:   7.29 cm/s LVOT diam:     1.90 cm  LV E/e' lateral: 21.7 LV SV:         55 LV SV Index:   24 LVOT Area:     2.84 cm  RIGHT VENTRICLE            IVC RV Basal diam:  5.20 cm    IVC diam: 1.70 cm RV S prime:     8.32 cm/s TAPSE (M-mode): 1.4 cm LEFT ATRIUM             Index       RIGHT ATRIUM           Index LA diam:        5.00 cm 2.21 cm/m  RA Area:     13.50 cm LA Vol (  A2C):   57.6 ml 25.49 ml/m RA Volume:   37.00 ml  16.38 ml/m LA Vol (A4C):   47.1 ml 20.85 ml/m LA Biplane Vol: 52.8 ml 23.37 ml/m  AORTIC VALVE LVOT Vmax:   114.00 cm/s LVOT Vmean:  79.150 cm/s LVOT VTI:    0.194 m  AORTA Ao Root diam: 3.80 cm MITRAL VALVE                TRICUSPID VALVE MV Area (PHT): 4.31  cm     TR Peak grad:   28.3 mmHg MV Decel Time: 176 msec     TR Vmax:        266.00 cm/s MV E velocity: 158.00 cm/s MV A velocity: 64.60 cm/s   SHUNTS MV E/A ratio:  2.45         Systemic VTI:  0.19 m                             Systemic Diam: 1.90 cm Gwyndolyn Kaufman MD Electronically signed by Gwyndolyn Kaufman MD Signature Date/Time: 07/01/2020/7:06:22 PM    Final      Subjective: - no chest pain, shortness of breath, no abdominal pain, nausea or vomiting.   Discharge Exam: BP 108/67 (BP Location: Right Arm)   Pulse 77   Temp 97.7 F (36.5 C) (Oral)   Resp 17   Ht 5\' 11"  (1.803 m)   Wt 106.4 kg   SpO2 97%   BMI 32.72 kg/m   General: Pt is alert, awake, not in acute distress Cardiovascular: RRR, S1/S2 +, no rubs, no gallops Respiratory: CTA bilaterally, no wheezing, no rhonchi Abdominal: Soft, NT, ND, bowel sounds + Extremities: no edema, no cyanosis    The results of significant diagnostics from this hospitalization (including imaging, microbiology, ancillary and laboratory) are listed below for reference.     Microbiology: Recent Results (from the past 240 hour(s))  Resp Panel by RT-PCR (Flu A&B, Covid) Nasopharyngeal Swab     Status: None   Collection Time: 07/21/20  6:21 PM   Specimen: Nasopharyngeal Swab; Nasopharyngeal(NP) swabs in vial transport medium  Result Value Ref Range Status   SARS Coronavirus 2 by RT PCR NEGATIVE NEGATIVE Final    Comment: (NOTE) SARS-CoV-2 target nucleic acids are NOT DETECTED.  The SARS-CoV-2 RNA is generally detectable in upper respiratory specimens during the acute phase of infection. The lowest concentration of SARS-CoV-2 viral copies this assay can detect is 138 copies/mL. A negative result does not preclude SARS-Cov-2 infection and should not be used as the sole basis for treatment or other patient management decisions. A negative result may occur with  improper specimen collection/handling, submission of specimen other than  nasopharyngeal swab, presence of viral mutation(s) within the areas targeted by this assay, and inadequate number of viral copies(<138 copies/mL). A negative result must be combined with clinical observations, patient history, and epidemiological information. The expected result is Negative.  Fact Sheet for Patients:  EntrepreneurPulse.com.au  Fact Sheet for Healthcare Providers:  IncredibleEmployment.be  This test is no t yet approved or cleared by the [place] FDA and  has been authorized for detection and/or diagnosis of SARS-CoV-2 by FDA under an Emergency Use Authorization (EUA). This EUA will remain  in effect (meaning this test can be used) for the duration of the COVID-19 declaration under Section 564(b)(1) of the Act, 21 U.S.C.section 360bbb-3(b)(1), unless the authorization is terminated  or revoked sooner.  Influenza A by PCR NEGATIVE NEGATIVE Final   Influenza B by PCR NEGATIVE NEGATIVE Final    Comment: (NOTE) The Xpert Xpress SARS-CoV-2/FLU/RSV plus assay is intended as an aid in the diagnosis of influenza from Nasopharyngeal swab specimens and should not be used as a sole basis for treatment. Nasal washings and aspirates are unacceptable for Xpert Xpress SARS-CoV-2/FLU/RSV testing.  Fact Sheet for Patients: EntrepreneurPulse.com.au  Fact Sheet for Healthcare Providers: IncredibleEmployment.be  This test is not yet approved or cleared by the [place] FDA and has been authorized for detection and/or diagnosis of SARS-CoV-2 by FDA under an Emergency Use Authorization (EUA). This EUA will remain in effect (meaning this test can be used) for the duration of the COVID-19 declaration under Section 564(b)(1) of the Act, 21 U.S.C. section 360bbb-3(b)(1), unless the authorization is terminated or revoked.  Performed at [facility], [place] [address], [place], [place] [address]       Labs: Basic Metabolic Panel: Recent Labs  Lab 07/21/20 1104 07/21/20 2106 07/22/20 0145 07/23/20 0102  NA 135  --  134* 134*  K 4.7  --  4.3 4.7  CL 102  --  100 99  CO2 24  --  27 28  GLUCOSE 155*  --  150* 119*  BUN 37*  --  42* 37*  CREATININE 2.01*  --  1.95* 1.95*  CALCIUM 10.4*  --  10.3 10.1  MG  --  2.1  --   --    Liver Function Tests: Recent Labs  Lab 07/21/20 1104 07/22/20 0145  AST 39 42*  ALT 56* 59*  ALKPHOS 50 51  BILITOT 0.7 0.9  PROT 7.4 7.7  ALBUMIN 3.2* 3.0*   CBC: Recent Labs  Lab 07/21/20 1104 07/22/20 0145  WBC 10.1 9.7  NEUTROABS 7.0  --   HGB 12.1* 12.4*  HCT 38.0* 38.7*  MCV 81.4 81.0  PLT 158 137*   CBG: No results for input(s): GLUCAP in the last 168 hours. Hgb A1c No results for input(s): HGBA1C in the last 72 hours. Lipid Profile No results for input(s): CHOL, HDL, LDLCALC, TRIG, CHOLHDL, LDLDIRECT in the last 72 hours. Thyroid function studies No results for input(s): TSH, T4TOTAL, T3FREE, THYROIDAB in the last 72 hours.  Invalid input(s): FREET3 Urinalysis    Component Value Date/Time   COLORURINE YELLOW 07/21/2020 [place] 07/21/2020 1136   LABSPEC 1.011 07/21/2020 1136   PHURINE 5.0 07/21/2020 1136   GLUCOSEU 50 (A) 07/21/2020 1136   HGBUR SMALL (A) 07/21/2020 1136   BILIRUBINUR NEGATIVE 07/21/2020 1136   KETONESUR NEGATIVE 07/21/2020 1136   PROTEINUR 100 (A) 07/21/2020 1136   UROBILINOGEN 0.2 02/04/2013 1430   NITRITE NEGATIVE 07/21/2020 1136   LEUKOCYTESUR NEGATIVE 07/21/2020 1136    FURTHER DISCHARGE INSTRUCTIONS:   Get Medicines reviewed and adjusted: Please take all your medications with you for your next visit with your Primary MD   Laboratory/radiological data: Please request your Primary MD to go over all hospital tests and procedure/radiological results at the follow up, please ask your Primary MD to get all Hospital records sent to his/her office.   In some cases, they will  be blood work, cultures and biopsy results pending at the time of your discharge. Please request that your primary care M.D. goes through all the records of your hospital data and follows up on these results.   Also Note the following: If you experience worsening of your admission symptoms, develop shortness of breath,  life threatening emergency, suicidal or homicidal thoughts you must seek medical attention immediately by calling 911 or calling your MD immediately  if symptoms less severe.   You must read complete instructions/literature along with all the possible adverse reactions/side effects for all the Medicines you take and that have been prescribed to you. Take any new Medicines after you have completely understood and accpet all the possible adverse reactions/side effects.    Do not drive when taking Pain medications or sleeping medications (Benzodaizepines)   Do not take more than prescribed Pain, Sleep and Anxiety Medications. It is not advisable to combine anxiety,sleep and pain medications without talking with your primary care practitioner   Special Instructions: If you have smoked or chewed Tobacco  in the last 2 yrs please stop smoking, stop any regular Alcohol  and or any Recreational drug use.   Wear Seat belts while driving.   Please note: You were cared for by a hospitalist during your hospital stay. Once you are discharged, your primary care physician will handle any further medical issues. Please note that NO REFILLS for any discharge medications will be authorized once you are discharged, as it is imperative that you return to your primary care physician (or establish a relationship with a primary care physician if you do not have one) for your post hospital discharge needs so that they can reassess your need for medications and monitor your lab values.  Time coordinating discharge: 35 minutes  SIGNED:  Marzetta Board, MD, PhD 07/23/2020, 8:05 AM

## 2020-07-25 NOTE — Progress Notes (Incomplete)
ADVANCED HF CLINIC NOTE  PCP: Leeroy Cha, MD Primary Cardiologist: Gwenlyn Found HF: DB  HPI:  Juan [name] 55 y/o male w/ h/o systolic HF, EF 51%, 3VCAD s/p CABG in 2014 followed by stenting to SVG-RCA in 2018, DM2, severe hypertriglyceridemia w/ subsequent episodes of pancreatitis.  Seen by Dr. Gwenlyn Found 02/09/2020 for progressive dyspnea and wt gain which was being managed by increasing home diuretics w/o much improvement in volume or symptoms. Echo repeated showing stable LVEF at 45%. RV systolic function low normal. Mild-mod MR also noted. Given persistent symptoms, he had LHC that showed progression of hisnative and graft disease. All vein grafts are functionally occluded with minimal left to right collaterals. His LIMA is patent although his LADis diffusely diseased. RHC showedmPCWP mildly elevated at 19. LVEDP 23. CO by Fick 3.25, CI 1.48, by thermo calculation CO 7, CI 3.2. Juan [name] was markedly edematous and admitted for management of acute on chronic systolic heart failure with IV diuretics.In addition, he had TTE which showed only mild-mod MR.  Diuresed with IV lasix and transitioned to [facility].  Here for f/u. Says he's not doing a great job keeping the fluid off. Stil working at [employer] was 18 when he left the hospital. 228 at last visit and 233 today. Taking torsemide 40 daily occasionally will take extra at night. Was taking 20 at night routinely but cut back due to bump in creatinine to 1.5 ->1.7 Can do most activities without problem. Walks 10K steps at work. Gets SOB if running or carrying 70 pound bag. No orthopnea or PND. Complaint with meds.  Last echo 07/01/2020 with EF 50-55%, moderately reduced RV systolic function.  Recently admitted by [facility] 07/23/20 for abdominal wall cellulitis. Placed on abx. Advised to stay off SGLT2is (Jardiance and Wilder Glade now both listed in allergy list due to cellulitis).   ReDS 37%   ROS: All systems negative except as listed  in HPI, PMH and Problem List.  SH:  Social History   Socioeconomic History  . Marital status: Married    Spouse name: [name]  . Number of children: 1  . Years of education: 34  . Highest education level: Not on file  Occupational History  . Occupation: Engineer, drilling     Employer: [employer]  Tobacco Use  . Smoking status: Never Smoker  . Smokeless tobacco: Never Used  Vaping Use  . Vaping Use: Never used  Substance and Sexual Activity  . Alcohol use: No  . Drug use: No  . Sexual activity: Yes  Other Topics Concern  . Not on file  Social History Narrative   Adopted, so no pertinent FH.  Married.  Lives with wife in [place].  Independent of ADLs and ambulation.   Social Determinants of Health   Financial Resource Strain: Not on file  Food Insecurity: Not on file  Transportation Needs: Not on file  Physical Activity: Not on file  Stress: Not on file  Social Connections: Not on file  Intimate Partner Violence: Not on file    FH:  Family History  Adopted: Yes    Past Medical History:  Diagnosis Date  . Abnormal cardiovascular stress test 02/06/2013  . Anemia    occasional - no problems currently(10/02/2011)  . Bilateral renal cysts 06/16/2011   states no known problems  . Blood transfusion without reported diagnosis   . CAD (coronary artery disease), LAD 90%, 1st diag 95%, LCX 70% wth AV groove 90%, RCA 40-50% mid and 80%  long distal stenosis 02/03/13 02/04/2013  . Chest pain    positive Myoview stress test  . CHF (congestive heart failure) ([place])   . Coronary artery calcification seen on CAT scan   . Diabetes mellitus    IDDM  . Fatty liver disease, nonalcoholic 1/61/0960  . Hyperlipidemia   . Hypertension    states no dx. of HTN, takes med. to protect kidneys due to DM  . Lateral meniscus tear 09/2011   left  . Loose body in knee 09/2011   loose bodies left knee  . Non Hodgkin's lymphoma ([place]) 1991  . Pancreatitis    occasional -  last episode 06/2011  . S/P CABG x 4, 02/05/13 LIMA-LAD; LT. RADIAL-OM;VG-DIAG; VG-PDA 02/06/2013   10/18 3/4 patent grafts (occluded SVG-->Diag), PCI/DESx 3 SVG-->RCA, normal EF  . Splenomegaly, congestive, chronic   . Stuffy and runny nose 10/02/2011   yellow drainage from nose    Current Outpatient Medications  Medication Sig Dispense Refill  . amoxicillin-clavulanate (AUGMENTIN) 875-125 MG tablet Take 1 tablet by mouth every 12 (twelve) hours for 7 days. 14 tablet 0  . aspirin 81 MG chewable tablet Chew 1 tablet (81 mg total) by mouth daily.    . clopidogrel (PLAVIX) 75 MG tablet TAKE 1 TABLET BY MOUTH EVERY DAY WITH BREAKFAST (Patient taking differently: Take 75 mg by mouth daily.) 90 tablet 3  . Continuous Blood Gluc Receiver ([place]) DEVI See admin instructions.    Marland Kitchen doxycycline (VIBRA-TABS) 100 MG tablet Take 1 tablet (100 mg total) by mouth 2 (two) times daily for 7 days. 14 tablet 0  . fenofibrate 160 MG tablet Take 160 mg by mouth daily.     . Glucose Blood (BAYER CONTOUR NEXT TEST VI) glucose testing    . insulin regular human CONCENTRATED (HUMULIN R) 500 UNIT/ML injection Inject into the skin continuous. Using insulin pump    . isosorbide mononitrate (IMDUR) 30 MG 24 hr tablet Take 1 tablet (30 mg total) by mouth daily. 90 tablet 3  . metFORMIN (GLUCOPHAGE) 1000 MG tablet Take 1,000 mg by mouth 2 (two) times daily with a meal.    . metoprolol succinate (TOPROL-XL) 25 MG 24 hr tablet Take 3 tablets (75 mg total) by mouth 2 (two) times daily. Take with or immediately following a meal. 540 tablet 3  . nitroGLYCERIN (NITROSTAT) 0.4 MG SL tablet Place 1 tablet (0.4 mg total) under the tongue every 5 (five) minutes as needed. (Patient taking differently: Place 0.4 mg under the tongue every 5 (five) minutes as needed for chest pain.) 25 tablet 2  . Propylene Glycol (SYSTANE BALANCE) 0.6 % SOLN Place 1 drop into both eyes daily as needed (dry eyes).    . sacubitril-valsartan  (ENTRESTO) 24-26 MG Take 1 tablet by mouth 2 (two) times daily. 180 tablet 3  . spironolactone (ALDACTONE) 25 MG tablet Take 1 tablet (25 mg total) by mouth daily. 90 tablet 3  . torsemide (DEMADEX) 20 MG tablet Take 40 mg by mouth daily.    Marland Kitchen VASCEPA 1 g capsule TAKE 2 CAPSULES BY MOUTH TWICE A DAY 360 capsule 4   No current facility-administered medications for this visit.    There were no vitals filed for this visit.  PHYSICAL EXAM:  General:  Well appearing. No resp difficulty HEENT: normal Neck: supple. no JVD. Carotids 2+ bilat; no bruits. No lymphadenopathy or thryomegaly appreciated. Cor: PMI nondisplaced. Regular rate & rhythm. No rubs, gallops or murmurs. Lungs: clear Abdomen: soft, nontender,  nondistended. No hepatosplenomegaly. No bruits or masses. Good bowel sounds. Extremities: no cyanosis, clubbing, rash, edema Neuro: alert & orientedx3, cranial nerves grossly intact. moves all 4 extremities w/o difficulty. Affect pleasant   ECG: NSR 84 RBBB LAFB Personally reviewed   ASSESSMENT & PLAN:  1. Chronic Systolic Heart Failure - Echo 2017 showed normal LVEF 60-65% - Echo 4/21, EF mildly reduced 45%. NST showed scar but no ischemia - Echo 11/21, EF 24%, RV systolic function low normal  - RHC 11/21 showed mildly elevated filling pressures, PCWP 19. LVEDP 23.CO by Fick 3.25, CI 1.48, by thermo calculation CO 7, CI 3.2. - Improved NYHA III-> II. Likely mildly volume overloaded. ReDS 39% - Increase spiro to 25 (he prefers not to split tablets) - Continue torsemide 40 daily. Will have him take an extra tab today - Continue Entresto 24-26 mg bid for now - Continue Toprol 75 bid - Ideally would like to add SGLT2i but he is functionally DM1 so I have asked him to discuss with Dr. Debbora Presto if this is ok. She said it was ok to try but stopped because he got cellulitis - I think Cardiomems will help better manage his fluid and avoid ups/downs in his renal function. He agrees. Will  submit insurance approval   2. CAD - s/p CABG in 2014, followed by PCI to [place] in 2018 -LHC this admit showed progression of native vessel CAD and grafts.All vein grafts are functionally occluded with minimal left to right collaterals. His LIMA is patent although his LADis diffusely diseased(diabetic).No targets for intervention and not a candidate for redo CABG.  - No s/s ischemia - Cont ASA, Plavix, LA nitrate +?blocker. Refusing statin   3. Mitral Regurgitation  - very mild MR noted on recent TEE 11/21 after diuresis  - no change - due for repeat echo   4. IDDM - has insulin pump  - managed by Dr. Debbora Presto - see SGLT2i discussion above  5. Severe Hypertriglyceridemia  - has been as high as 6,000 - likely familial but he does not know family history (adopted).  - has had multiple occurences of pancreatitis. - now follows with Dr. Debara Pickett with diagnosis of severe hyperchylomicronemia. His testing for FCS was genetically negative. Therefore he did not qualify for the BALANCE out, however he can roll into the CORE trial.  Lyda Jester, PA-C  3:11 PM

## 2020-07-26 ENCOUNTER — Encounter (HOSPITAL_COMMUNITY): Payer: Commercial Managed Care - PPO

## 2020-07-27 NOTE — Telephone Encounter (Signed)
Spoke with [employer] rep Will assist with application and or appeal

## 2020-07-28 ENCOUNTER — Emergency Department (HOSPITAL_COMMUNITY)
Admission: EM | Admit: 2020-07-28 | Discharge: 2020-07-29 | Disposition: A | Discharge status: Home / Self Care | Payer: Commercial Managed Care - PPO | Attending: Emergency Medicine | Admitting: Emergency Medicine

## 2020-07-28 ENCOUNTER — Other Ambulatory Visit: Payer: Self-pay

## 2020-07-28 ENCOUNTER — Encounter (HOSPITAL_COMMUNITY): Payer: Self-pay

## 2020-07-28 DIAGNOSIS — Z7982 Long term (current) use of aspirin: Secondary | ICD-10-CM | POA: Diagnosis not present

## 2020-07-28 DIAGNOSIS — E119 Type 2 diabetes mellitus without complications: Secondary | ICD-10-CM | POA: Insufficient documentation

## 2020-07-28 DIAGNOSIS — I2510 Atherosclerotic heart disease of native coronary artery without angina pectoris: Secondary | ICD-10-CM | POA: Diagnosis not present

## 2020-07-28 DIAGNOSIS — I110 Hypertensive heart disease with heart failure: Secondary | ICD-10-CM | POA: Insufficient documentation

## 2020-07-28 DIAGNOSIS — L02211 Cutaneous abscess of abdominal wall: Secondary | ICD-10-CM | POA: Diagnosis not present

## 2020-07-28 DIAGNOSIS — Z955 Presence of coronary angioplasty implant and graft: Secondary | ICD-10-CM | POA: Diagnosis not present

## 2020-07-28 DIAGNOSIS — Z8572 Personal history of non-Hodgkin lymphomas: Secondary | ICD-10-CM | POA: Diagnosis not present

## 2020-07-28 DIAGNOSIS — Z7984 Long term (current) use of oral hypoglycemic drugs: Secondary | ICD-10-CM | POA: Insufficient documentation

## 2020-07-28 DIAGNOSIS — Z79899 Other long term (current) drug therapy: Secondary | ICD-10-CM | POA: Diagnosis not present

## 2020-07-28 DIAGNOSIS — I5042 Chronic combined systolic (congestive) and diastolic (congestive) heart failure: Secondary | ICD-10-CM | POA: Insufficient documentation

## 2020-07-28 DIAGNOSIS — L03311 Cellulitis of abdominal wall: Secondary | ICD-10-CM | POA: Diagnosis present

## 2020-07-28 LAB — CBC WITH DIFFERENTIAL/PLATELET
Abs Immature Granulocytes: 0.29 10*3/uL — ABNORMAL HIGH (ref 0.00–0.07)
Basophils Absolute: 0.2 10*3/uL — ABNORMAL HIGH (ref 0.0–0.1)
Basophils Relative: 2 %
Eosinophils Absolute: 0.4 10*3/uL (ref 0.0–0.5)
Eosinophils Relative: 5 %
HCT: 39.2 % (ref 39.0–52.0)
Hemoglobin: 12.2 g/dL — ABNORMAL LOW (ref 13.0–17.0)
Immature Granulocytes: 4 %
Lymphocytes Relative: 24 %
Lymphs Abs: 1.8 10*3/uL (ref 0.7–4.0)
MCH: 25.6 pg — ABNORMAL LOW (ref 26.0–34.0)
MCHC: 31.1 g/dL (ref 30.0–36.0)
MCV: 82.2 fL (ref 80.0–100.0)
Monocytes Absolute: 0.6 10*3/uL (ref 0.1–1.0)
Monocytes Relative: 7 %
Neutro Abs: 4.6 10*3/uL (ref 1.7–7.7)
Neutrophils Relative %: 58 %
Platelets: 220 10*3/uL (ref 150–400)
RBC: 4.77 MIL/uL (ref 4.22–5.81)
RDW: 15 % (ref 11.5–15.5)
WBC: 7.8 10*3/uL (ref 4.0–10.5)
nRBC: 0 % (ref 0.0–0.2)

## 2020-07-28 LAB — BASIC METABOLIC PANEL
Anion gap: 9 (ref 5–15)
BUN: 41 mg/dL — ABNORMAL HIGH (ref 6–20)
CO2: 26 mmol/L (ref 22–32)
Calcium: 10.9 mg/dL — ABNORMAL HIGH (ref 8.9–10.3)
Chloride: 101 mmol/L (ref 98–111)
Creatinine, Ser: 1.64 mg/dL — ABNORMAL HIGH (ref 0.61–1.24)
GFR, Estimated: 50 mL/min — ABNORMAL LOW (ref >60)
Glucose, Bld: 118 mg/dL — ABNORMAL HIGH (ref 70–99)
Potassium: 4.5 mmol/L (ref 3.5–5.1)
Sodium: 136 mmol/L (ref 135–145)

## 2020-07-28 NOTE — ED Provider Notes (Signed)
Emergency Medicine Provider Triage Evaluation Note  Juan [name] , a 55 y.o. male  was evaluated in triage.  Pt complains of wound check. Was admitted last week for cellulitis of the abdomen. Here is here today for wound recheck. Reports sxs are mostly resolved but he still has some redness and warmth present. He has 2 days of abx left. Denies fevers.  Review of Systems  Positive: cellulitis Negative: fever  Physical Exam  BP (!) 108/52 (BP Location: Right Arm)   Pulse 87   Temp 98.2 F (36.8 C) (Oral)   Resp 15   SpO2 96%  Gen:   Awake, no distress   Resp:  Normal effort  MSK:   Moves extremities without difficulty  Other:  Two areas of erythema, warmth and tenderness to the abdomen  Medical Decision Making  Medically screening exam initiated at 3:23 PM.  Appropriate orders placed.  Juan [name] was informed that the remainder of the evaluation will be completed by another provider, this initial triage assessment does not replace that evaluation, and the importance of remaining in the ED until their evaluation is complete.    Rodney Booze, PA-C 07/28/20 [place], DO 07/28/20 1531

## 2020-07-28 NOTE — ED Provider Notes (Signed)
[facility] EMERGENCY DEPARTMENT Provider Note  CSN: [id number] Arrival date & time: 07/28/20 1426  Chief Complaint(s) Wound Check  HPI Juan [name] is a 55 y.o. male with a past medical history listed below including diabetes who was discharged from the hospital last week after being treated for bilateral abdominal wall cellulitis.  Patient is currently on day 5 of 7 of his oral antibiotics.  He reports that the cellulitis on the left lower abdomen is improving.  Reports that he cellulitis on the right lower abdomen is improving but he is noticing some bloody and purulent discharge.  He is endorsing throbbing pain worse with palpation.  Patient reports being compliant with his medication.  No fevers or chills.  No other physical complaints.  HPI  Past Medical History Past Medical History:  Diagnosis Date  . Abnormal cardiovascular stress test 02/06/2013  . Anemia    occasional - no problems currently(10/02/2011)  . Bilateral renal cysts 06/16/2011   states no known problems  . Blood transfusion without reported diagnosis   . CAD (coronary artery disease), LAD 90%, 1st diag 95%, LCX 70% wth AV groove 90%, RCA 40-50% mid and 80% long distal stenosis 02/03/13 02/04/2013  . Chest pain    positive Myoview stress test  . CHF (congestive heart failure) ([place])   . Coronary artery calcification seen on CAT scan   . Diabetes mellitus    IDDM  . Fatty liver disease, nonalcoholic [id number]  . Hyperlipidemia   . Hypertension    states no dx. of HTN, takes med. to protect kidneys due to DM  . Lateral meniscus tear 09/2011   left  . Loose body in knee 09/2011   loose bodies left knee  . Non Hodgkin's lymphoma ([place]) 1991  . Pancreatitis    occasional - last episode 06/2011  . S/P CABG x 4, 02/05/13 LIMA-LAD; LT. RADIAL-OM;VG-DIAG; VG-PDA 02/06/2013   10/18 3/4 patent grafts (occluded SVG-->Diag), PCI/DESx 3 SVG-->RCA, normal EF  . Splenomegaly, congestive, chronic   . Stuffy  and runny nose 10/02/2011   yellow drainage from nose   Patient Active Problem List   Diagnosis Date Noted  . Cellulitis 07/21/2020  . Chronic combined systolic and diastolic CHF (congestive heart failure) ([place]) 02/15/2020  . HTN (hypertension)   . Sepsis ([place])   . Cellulitis of right leg   . Hyponatremia   . Lactic acidosis   . Elevated troponin   . Radiculopathy 03/26/2018  . Right bundle branch block 01/07/2018  . Chest pain 12/17/2016  . Diastolic dysfunction [id number]  . Edema of both legs 02/03/2016  . Dyspnea on exertion 02/03/2016  . S/P CABG x 4 02/06/2013  . CAD (coronary artery disease) 02/04/2013  . Other malignant lymphomas, unspecified site, extranodal and solid organ sites 08/25/2012  . Personal history of lymphatic and hematopoietic neoplasm 06/05/2012  . Personal history of digestive disease 06/05/2012  . Hyperlipidemia 06/04/2012  . Chronic nonalcoholic liver disease [id number]  . Thrombocytopenia ([place]) 06/17/2011  . Chronic pancreatitis ([place]) 06/16/2011  . Dyslipidemia 06/16/2011  . Hyperkalemia 06/16/2011  . Acute kidney injury ([place]) 06/16/2011  . Dehydration 06/16/2011  . Type 2 diabetes mellitus treated with insulin ([place]) 06/16/2011  . Microcytosis 06/16/2011  . Fatty liver disease, nonalcoholic [id number]  . Bilateral renal cysts 06/16/2011  . Splenomegaly 06/16/2011   Home Medication(s) Prior to Admission medications   Medication Sig Start Date End Date Taking? Authorizing Provider  amoxicillin-clavulanate (AUGMENTIN) 875-125 MG tablet Take 1 tablet by  mouth every 12 (twelve) hours for 7 days. 07/31/20 08/07/20  Fatima Blank, MD  aspirin 81 MG chewable tablet Chew 1 tablet (81 mg total) by mouth daily. 12/19/16   Cheryln Manly, NP  clopidogrel (PLAVIX) 75 MG tablet TAKE 1 TABLET BY MOUTH EVERY DAY WITH BREAKFAST Patient taking differently: Take 75 mg by mouth daily. 05/23/20   Lorretta Harp, MD  Continuous Blood Gluc Receiver ([place]) [place] See admin instructions. 12/09/19   [provider]  doxycycline (VIBRA-TABS) 100 MG tablet Take 1 tablet (100 mg total) by mouth 2 (two) times daily for 7 days. 07/31/20 08/07/20  Fatima Blank, MD  fenofibrate 160 MG tablet Take 160 mg by mouth daily.     [provider]  Glucose Blood (BAYER CONTOUR NEXT TEST VI) glucose testing    [provider]  insulin regular human CONCENTRATED (HUMULIN R) 500 UNIT/ML injection Inject into the skin continuous. Using insulin pump    [provider]  isosorbide mononitrate (IMDUR) 30 MG 24 hr tablet Take 1 tablet (30 mg total) by mouth daily. 11/12/19 05/10/20  Pixie Casino, MD  metFORMIN (GLUCOPHAGE) 1000 MG tablet Take 1,000 mg by mouth 2 (two) times daily with a meal.    [provider]  metoprolol succinate (TOPROL-XL) 25 MG 24 hr tablet Take 3 tablets (75 mg total) by mouth 2 (two) times daily. Take with or immediately following a meal. 06/18/19 06/12/20  Lorretta Harp, MD  nitroGLYCERIN (NITROSTAT) 0.4 MG SL tablet Place 1 tablet (0.4 mg total) under the tongue every 5 (five) minutes as needed. Patient taking differently: Place 0.4 mg under the tongue every 5 (five) minutes as needed for chest pain. 06/18/19   Lorretta Harp, MD  Propylene Glycol (SYSTANE BALANCE) 0.6 % SOLN Place 1 drop into both eyes daily as needed (dry eyes).    [provider]  sacubitril-valsartan (ENTRESTO) 24-26 MG Take 1 tablet by mouth 2 (two) times daily. 03/14/20   Bensimhon, [name], MD  spironolactone (ALDACTONE) 25 MG tablet Take 1 tablet (25 mg total) by mouth daily. 05/26/20   Bensimhon, [name], MD  torsemide (DEMADEX) 20 MG tablet Take 40 mg by mouth daily.    [provider]  VASCEPA 1 g capsule TAKE 2 CAPSULES BY MOUTH TWICE A DAY 12/08/19   Lorretta Harp, MD                                                                                                                                     Past Surgical History Past Surgical History:  Procedure Laterality Date  . ABDOMINAL AORTIC ANEURYSM REPAIR    . ANTERIOR CERVICAL DECOMP/DISCECTOMY FUSION N/A 03/26/2018   Procedure: ANTERIOR CERVICAL DECOMPRESSION FUSION CERVICAL 5-6 WITH INSTRUMENTATION AND ALLOGRAFT;  Surgeon: Phylliss Bob, MD;  Location: [place];  Service: Orthopedics;  Laterality: N/A;  . CORONARY ARTERY  BYPASS GRAFT N/A 02/05/2013   Procedure: CORONARY ARTERY BYPASS GRAFTING (CABG);  Surgeon: Ivin Poot, MD;  Location: [place];  Service: Open Heart Surgery;  Laterality: N/A;  Coronary artery bypass graft on pump times four using left internal mammary artery and right greater saphenous vein via endovein harvest and left radial artery harvest.   . CORONARY STENT INTERVENTION  12/17/2016    PCI and drug-eluting stenting of the mid and distal RCA SVG   . CORONARY STENT INTERVENTION N/A 12/17/2016   Procedure: CORONARY STENT INTERVENTION;  Surgeon: Lorretta Harp, MD;  Location: [place] [facility];  Service: Cardiovascular;  Laterality: N/A;  . ELBOW SURGERY    . HERNIA REPAIR     inguinal   . herniated disc    . INTRAOPERATIVE TRANSESOPHAGEAL ECHOCARDIOGRAM N/A 02/05/2013   Procedure: INTRAOPERATIVE TRANSESOPHAGEAL ECHOCARDIOGRAM;  Surgeon: Ivin Poot, MD;  Location: [place];  Service: Open Heart Surgery;  Laterality: N/A;  . KNEE ARTHROSCOPY  10/09/2011   Procedure: ARTHROSCOPY KNEE;  Surgeon: Nita Sells, MD;  Location: [place];  Service: Orthopedics;  Laterality: Left;  . LEFT HEART CATH AND CORS/GRAFTS ANGIOGRAPHY N/A 12/17/2016   Procedure: LEFT HEART CATH AND CORS/GRAFTS ANGIOGRAPHY;  Surgeon: Lorretta Harp, MD;  Location: [place] [facility];  Service: Cardiovascular;  Laterality: N/A;  . LEFT HEART CATHETERIZATION WITH CORONARY ANGIOGRAM N/A 02/03/2013   Procedure: LEFT HEART CATHETERIZATION WITH CORONARY ANGIOGRAM;  Surgeon: Lorretta Harp, MD;  Location: [facility] CATH  LAB;  Service: Cardiovascular;  Laterality: N/A;  . NASAL SEPTUM SURGERY    . RADIAL ARTERY HARVEST Left 02/05/2013   Procedure: RADIAL ARTERY HARVEST;  Surgeon: Ivin Poot, MD;  Location: [place];  Service: Vascular;  Laterality: Left;  . RIGHT/LEFT HEART CATH AND CORONARY/GRAFT ANGIOGRAPHY N/A 02/15/2020   Procedure: RIGHT/LEFT HEART CATH AND CORONARY/GRAFT ANGIOGRAPHY;  Surgeon: Lorretta Harp, MD;  Location: [place] CV LAB;  Service: Cardiovascular;  Laterality: N/A;  . TEE WITHOUT CARDIOVERSION N/A 02/18/2020   Procedure: TRANSESOPHAGEAL ECHOCARDIOGRAM (TEE);  Surgeon: Jolaine Artist, MD;  Location: [facility];  Service: Cardiovascular;  Laterality: N/A;  . TIBIA BONE BIOPSY  x 3   left  . TONSILLECTOMY     Family History Family History  Adopted: Yes    Social History Social History   Tobacco Use  . Smoking status: Never Smoker  . Smokeless tobacco: Never Used  Vaping Use  . Vaping Use: Never used  Substance Use Topics  . Alcohol use: No  . Drug use: No   Allergies Reglan [metoclopramide] and Sglt2 inhibitors  Review of Systems Review of Systems All other systems are reviewed and are negative for acute change except as noted in the HPI  Physical Exam Vital Signs  I have reviewed the triage vital signs BP 124/75 (BP Location: Right Arm)   Pulse 78   Temp 98.1 F (36.7 C) (Oral)   Resp 16   SpO2 99%   Physical Exam Vitals reviewed.  Constitutional:      General: He is not in acute distress.    Appearance: He is well-developed. He is not diaphoretic.  HENT:     Head: Normocephalic and atraumatic.     Jaw: No trismus.     Right Ear: External ear normal.     Left Ear: External ear normal.     Nose: Nose normal.  Eyes:     General: No scleral icterus.    Conjunctiva/sclera: Conjunctivae normal.  Neck:     Trachea: Phonation normal.  Cardiovascular:     Rate and Rhythm: Normal rate and regular rhythm.  Pulmonary:     Effort: Pulmonary  effort is normal. No respiratory distress.     Breath sounds: No stridor.  Abdominal:     General: There is no distension.    Musculoskeletal:        General: Normal range of motion.     Cervical back: Normal range of motion.  Neurological:     Mental Status: He is alert and oriented to person, place, and time.  Psychiatric:        Behavior: Behavior normal.     ED Results and Treatments Labs (all labs ordered are listed, but only abnormal results are displayed) Labs Reviewed  CBC WITH DIFFERENTIAL/PLATELET - Abnormal; Notable for the following components:      Result Value   Hemoglobin 12.2 (*)    MCH 25.6 (*)    Basophils Absolute 0.2 (*)    Abs Immature Granulocytes 0.29 (*)    All other components within normal limits  BASIC METABOLIC PANEL - Abnormal; Notable for the following components:   Glucose, Bld 118 (*)    BUN 41 (*)    Creatinine, Ser 1.64 (*)    Calcium 10.9 (*)    GFR, Estimated 50 (*)    All other components within normal limits                                                                                                                         EKG  EKG Interpretation  Date/Time:    Ventricular Rate:    PR Interval:    QRS Duration:   QT Interval:    QTC Calculation:   R Axis:     Text Interpretation:        Radiology No results found.  Pertinent labs & imaging results that were available during my care of the patient were reviewed by me and considered in my medical decision making (see chart for details).  Medications Ordered in ED Medications  lidocaine-EPINEPHrine (XYLOCAINE W/EPI) 2 %-1:200000 (PF) injection 20 mL (20 mLs Intradermal Given by Other 07/29/20 0205)                                                                                                                                    Procedures Ultrasound ED Soft Tissue  Date/Time: 07/29/2020 3:34 AM Performed by: Fatima Blank, MD Authorized by: Fatima Blank, MD   Procedure details:    Indications: localization of abscess and evaluate for cellulitis     Transverse view:  Visualized   Longitudinal view:  Visualized   Images: archived   Location:    Location: abdominal wall     Side:  Right Findings:     abscess present    cellulitis present Ultrasound ED Soft Tissue  Date/Time: 07/29/2020 3:35 AM Performed by: Fatima Blank, MD Authorized by: Fatima Blank, MD   Procedure details:    Indications: localization of abscess and evaluate for cellulitis     Transverse view:  Visualized   Longitudinal view:  Visualized   Images: archived   Location:    Location: abdominal wall     Side:  Left Findings:     no abscess present    no cellulitis present .Marland KitchenIncision and Drainage  Date/Time: 07/29/2020 3:35 AM Performed by: Fatima Blank, MD Authorized by: Fatima Blank, MD   Consent:    Consent obtained:  Verbal   Consent given by:  Patient   Risks discussed:  Bleeding, damage to other organs, infection and incomplete drainage   Alternatives discussed:  Delayed treatment Universal protocol:    Relevant documents present and verified: yes     Patient identity confirmed:  Arm band Location:    Type:  Abscess   Size:  Approx 8-10 cm   Location:  Trunk   Trunk location:  Abdomen Pre-procedure details:    Skin preparation:  Chlorhexidine Anesthesia:    Anesthesia method:  Local infiltration   Local anesthetic:  Lidocaine 2% WITH epi Procedure type:    Complexity:  Complex Procedure details:    Ultrasound guidance: yes     Incision types:  Elliptical   Incision depth:  Subcutaneous   Wound management:  Probed and deloculated and extensive cleaning   Drainage:  Bloody and purulent   Drainage amount:  Copious   Wound treatment:  Wound left open   Packing materials:  1/4 in gauze Post-procedure details:    Procedure completion:  Tolerated well, no immediate  complications    (including critical care time)  Medical Decision Making / ED Course I have reviewed the nursing notes for this encounter and the patient's prior records (if available in EHR or on provided paperwork).   Juan [name] was evaluated in Emergency Department on 07/29/2020 for the symptoms described in the history of present illness. He was evaluated in the context of the global COVID-19 pandemic, which necessitated consideration that the patient might be at risk for infection with the SARS-CoV-2 virus that causes COVID-19. Institutional protocols and algorithms that pertain to the evaluation of patients at risk for COVID-19 are in a state of rapid change based on information released by regulatory bodies including the CDC and federal and state organizations. These policies and algorithms were followed during the patient's care in the ED.  Work-up notable for right lower abdominal wall abscess.  I&D as above with copious amounts of bloody purulent discharge consistent with likely infected hematoma. Left lower abdominal wall cellulitis appears to be improving.  No underlying abscess noted.  Labs grossly reassuring without leukocytosis.  Will extend patient's antibiotics for an additional week.  Return in 2 to 3 days for wound check and packing removal.    Patient was referred to wound care by his PCP and currently arranging clinic visit.  Final Clinical Impression(s) / ED Diagnoses Final diagnoses:  Abdominal wall abscess   The patient appears reasonably screened and/or stabilized for discharge and I doubt any other medical condition or other [facility] requiring further screening, evaluation, or treatment in the ED at this time prior to discharge. Safe for discharge with strict return precautions.  Disposition: Discharge  Condition: Good  I have discussed the results, Dx and Tx plan with the patient/family who expressed understanding and agree(s) with the plan. Discharge  instructions discussed at length. The patient/family was given strict return precautions who verbalized understanding of the instructions. No further questions at time of discharge.    ED Discharge Orders         Ordered    amoxicillin-clavulanate (AUGMENTIN) 875-125 MG tablet  Every 12 hours        07/29/20 0324    doxycycline (VIBRA-TABS) 100 MG tablet  2 times daily        07/29/20 0324            Follow Up: [facility] [address] [id number] [place] [phone] Go to  for wound re-check in 2-3 days      This chart was dictated using voice recognition software.  Despite best efforts to proofread,  errors can occur which can change the documentation meaning.   Fatima Blank, MD 07/29/20 [phone]

## 2020-07-28 NOTE — ED Triage Notes (Signed)
Pt admitted last week for cellulitis on right lower abdomen, was sent home with anbx. Pt reports drainage and pain at site. Would like area rechecked.

## 2020-07-29 MED ORDER — AMOXICILLIN-POT CLAVULANATE 875-125 MG PO TABS: 1.0000 | ORAL_TABLET | Freq: Two times a day (BID) | ORAL | 0 refills | Status: AC

## 2020-07-29 MED ORDER — LIDOCAINE-EPINEPHRINE (PF) 2 %-1:200000 IJ SOLN
20.0000 mL | Freq: Once | INTRAMUSCULAR | Status: AC
  Administered 2020-07-29: 20 mL via INTRADERMAL
  Filled 2020-07-29: qty 20

## 2020-07-29 MED ORDER — DOXYCYCLINE HYCLATE 100 MG PO TABS: 100.0000 mg | ORAL_TABLET | Freq: Two times a day (BID) | ORAL | 0 refills | Status: AC

## 2020-08-01 ENCOUNTER — Emergency Department (HOSPITAL_COMMUNITY)
Admission: EM | Admit: 2020-08-01 | Discharge: 2020-08-01 | Disposition: A | Discharge status: Home / Self Care | Payer: Commercial Managed Care - PPO | Attending: Emergency Medicine | Admitting: Emergency Medicine

## 2020-08-01 ENCOUNTER — Encounter (HOSPITAL_COMMUNITY): Payer: Self-pay

## 2020-08-01 ENCOUNTER — Other Ambulatory Visit: Payer: Self-pay

## 2020-08-01 DIAGNOSIS — I2510 Atherosclerotic heart disease of native coronary artery without angina pectoris: Secondary | ICD-10-CM | POA: Diagnosis not present

## 2020-08-01 DIAGNOSIS — I5042 Chronic combined systolic (congestive) and diastolic (congestive) heart failure: Secondary | ICD-10-CM | POA: Insufficient documentation

## 2020-08-01 DIAGNOSIS — Z79899 Other long term (current) drug therapy: Secondary | ICD-10-CM | POA: Diagnosis not present

## 2020-08-01 DIAGNOSIS — E119 Type 2 diabetes mellitus without complications: Secondary | ICD-10-CM | POA: Diagnosis not present

## 2020-08-01 DIAGNOSIS — Z7984 Long term (current) use of oral hypoglycemic drugs: Secondary | ICD-10-CM | POA: Diagnosis not present

## 2020-08-01 DIAGNOSIS — Z7902 Long term (current) use of antithrombotics/antiplatelets: Secondary | ICD-10-CM | POA: Diagnosis not present

## 2020-08-01 DIAGNOSIS — Z7982 Long term (current) use of aspirin: Secondary | ICD-10-CM | POA: Insufficient documentation

## 2020-08-01 DIAGNOSIS — Z5189 Encounter for other specified aftercare: Secondary | ICD-10-CM | POA: Diagnosis not present

## 2020-08-01 DIAGNOSIS — I110 Hypertensive heart disease with heart failure: Secondary | ICD-10-CM | POA: Insufficient documentation

## 2020-08-01 DIAGNOSIS — Z951 Presence of aortocoronary bypass graft: Secondary | ICD-10-CM | POA: Diagnosis not present

## 2020-08-01 DIAGNOSIS — Z794 Long term (current) use of insulin: Secondary | ICD-10-CM | POA: Insufficient documentation

## 2020-08-01 DIAGNOSIS — Z48815 Encounter for surgical aftercare following surgery on the digestive system: Secondary | ICD-10-CM | POA: Diagnosis not present

## 2020-08-01 NOTE — ED Provider Notes (Signed)
[facility] EMERGENCY DEPARTMENT Provider Note   CSN: [id number] Arrival date & time: 08/01/20  1062     History Chief Complaint  Patient presents with  . Follow-up    Juan [name] is a 55 y.o. male.  55 year old male returns to the emergency room for wound check, seen in this ED 3 days ago and had abscess to right lower abdomen I&D with packing placed.  Patient is compliant with antibiotics and reports area is improving.  Is not scheduled to see wound care until next week.        Past Medical History:  Diagnosis Date  . Abnormal cardiovascular stress test 02/06/2013  . Anemia    occasional - no problems currently(10/02/2011)  . Bilateral renal cysts 06/16/2011   states no known problems  . Blood transfusion without reported diagnosis   . CAD (coronary artery disease), LAD 90%, 1st diag 95%, LCX 70% wth AV groove 90%, RCA 40-50% mid and 80% long distal stenosis 02/03/13 02/04/2013  . Chest pain    positive Myoview stress test  . CHF (congestive heart failure) ([place])   . Coronary artery calcification seen on CAT scan   . Diabetes mellitus    IDDM  . Fatty liver disease, nonalcoholic 6/94/8546  . Hyperlipidemia   . Hypertension    states no dx. of HTN, takes med. to protect kidneys due to DM  . Lateral meniscus tear 09/2011   left  . Loose body in knee 09/2011   loose bodies left knee  . Non Hodgkin's lymphoma ([place]) 1991  . Pancreatitis    occasional - last episode 06/2011  . S/P CABG x 4, 02/05/13 LIMA-LAD; LT. RADIAL-OM;VG-DIAG; VG-PDA 02/06/2013   10/18 3/4 patent grafts (occluded SVG-->Diag), PCI/DESx 3 SVG-->RCA, normal EF  . Splenomegaly, congestive, chronic   . Stuffy and runny nose 10/02/2011   yellow drainage from nose    Patient Active Problem List   Diagnosis Date Noted  . Cellulitis 07/21/2020  . Chronic combined systolic and diastolic CHF (congestive heart failure) ([place]) 02/15/2020  . HTN (hypertension)   . Sepsis ([place])   .  Cellulitis of right leg   . Hyponatremia   . Lactic acidosis   . Elevated troponin   . Radiculopathy 03/26/2018  . Right bundle branch block 01/07/2018  . Chest pain 12/17/2016  . Diastolic dysfunction 27/05/5007  . Edema of both legs 02/03/2016  . Dyspnea on exertion 02/03/2016  . S/P CABG x 4 02/06/2013  . CAD (coronary artery disease) 02/04/2013  . Other malignant lymphomas, unspecified site, extranodal and solid organ sites 08/25/2012  . Personal history of lymphatic and hematopoietic neoplasm 06/05/2012  . Personal history of digestive disease 06/05/2012  . Hyperlipidemia 06/04/2012  . Chronic nonalcoholic liver disease 38/18/2993  . Thrombocytopenia ([place]) 06/17/2011  . Chronic pancreatitis ([place]) 06/16/2011  . Dyslipidemia 06/16/2011  . Hyperkalemia 06/16/2011  . Acute kidney injury ([place]) 06/16/2011  . Dehydration 06/16/2011  . Type 2 diabetes mellitus treated with insulin ([place]) 06/16/2011  . Microcytosis 06/16/2011  . Fatty liver disease, nonalcoholic 71/69/6789  . Bilateral renal cysts 06/16/2011  . Splenomegaly 06/16/2011    Past Surgical History:  Procedure Laterality Date  . ABDOMINAL AORTIC ANEURYSM REPAIR    . ANTERIOR CERVICAL DECOMP/DISCECTOMY FUSION N/A 03/26/2018   Procedure: ANTERIOR CERVICAL DECOMPRESSION FUSION CERVICAL 5-6 WITH INSTRUMENTATION AND ALLOGRAFT;  Surgeon: Phylliss Bob, MD;  Location: [place];  Service: Orthopedics;  Laterality: N/A;  . CORONARY ARTERY BYPASS GRAFT N/A 02/05/2013  Procedure: CORONARY ARTERY BYPASS GRAFTING (CABG);  Surgeon: Ivin Poot, MD;  Location: [place];  Service: Open Heart Surgery;  Laterality: N/A;  Coronary artery bypass graft on pump times four using left internal mammary artery and right greater saphenous vein via endovein harvest and left radial artery harvest.   . CORONARY STENT INTERVENTION  12/17/2016    PCI and drug-eluting stenting of the mid and distal RCA SVG   . CORONARY STENT INTERVENTION N/A 12/17/2016    Procedure: CORONARY STENT INTERVENTION;  Surgeon: Lorretta Harp, MD;  Location: [place] [facility];  Service: Cardiovascular;  Laterality: N/A;  . ELBOW SURGERY    . HERNIA REPAIR     inguinal   . herniated disc    . INTRAOPERATIVE TRANSESOPHAGEAL ECHOCARDIOGRAM N/A 02/05/2013   Procedure: INTRAOPERATIVE TRANSESOPHAGEAL ECHOCARDIOGRAM;  Surgeon: Ivin Poot, MD;  Location: [place];  Service: Open Heart Surgery;  Laterality: N/A;  . KNEE ARTHROSCOPY  10/09/2011   Procedure: ARTHROSCOPY KNEE;  Surgeon: Nita Sells, MD;  Location: [place];  Service: Orthopedics;  Laterality: Left;  . LEFT HEART CATH AND CORS/GRAFTS ANGIOGRAPHY N/A 12/17/2016   Procedure: LEFT HEART CATH AND CORS/GRAFTS ANGIOGRAPHY;  Surgeon: Lorretta Harp, MD;  Location: [place] [facility];  Service: Cardiovascular;  Laterality: N/A;  . LEFT HEART CATHETERIZATION WITH CORONARY ANGIOGRAM N/A 02/03/2013   Procedure: LEFT HEART CATHETERIZATION WITH CORONARY ANGIOGRAM;  Surgeon: Lorretta Harp, MD;  Location: [facility] C[facility] LAB;  Service: Cardiovascular;  Laterality: N/A;  . NASAL SEPTUM SURGERY    . RADIAL ARTERY HARVEST Left 02/05/2013   Procedure: RADIAL ARTERY HARVEST;  Surgeon: Ivin Poot, MD;  Location: [place];  Service: Vascular;  Laterality: Left;  . RIGHT/LEFT HEART CATH AND CORONARY/GRAFT ANGIOGRAPHY N/A 02/15/2020   Procedure: RIGHT/LEFT HEART CATH AND CORONARY/GRAFT ANGIOGRAPHY;  Surgeon: Lorretta Harp, MD;  Location: [place] [facility];  Service: Cardiovascular;  Laterality: N/A;  . TEE WITHOUT CARDIOVERSION N/A 02/18/2020   Procedure: TRANSESOPHAGEAL ECHOCARDIOGRAM (TEE);  Surgeon: Jolaine Artist, MD;  Location: [facility];  Service: Cardiovascular;  Laterality: N/A;  . TIBIA BONE BIOPSY  x 3   left  . TONSILLECTOMY         Family History  Adopted: Yes    Social History   Tobacco Use  . Smoking status: Never Smoker  . Smokeless tobacco: Never Used  Vaping Use   . Vaping Use: Never used  Substance Use Topics  . Alcohol use: No  . Drug use: No    Home Medications Prior to Admission medications   Medication Sig Start Date End Date Taking? Authorizing Provider  amoxicillin-clavulanate (AUGMENTIN) 875-125 MG tablet Take 1 tablet by mouth every 12 (twelve) hours for 7 days. 07/31/20 08/07/20  Fatima Blank, MD  aspirin 81 MG chewable tablet Chew 1 tablet (81 mg total) by mouth daily. 12/19/16   Cheryln Manly, NP  clopidogrel (PLAVIX) 75 MG tablet TAKE 1 TABLET BY MOUTH EVERY DAY WITH BREAKFAST Patient taking differently: Take 75 mg by mouth daily. 05/23/20   Lorretta Harp, MD  Continuous Blood Gluc Receiver ([place]) [place] See admin instructions. 12/09/19   [provider]  doxycycline (VIBRA-TABS) 100 MG tablet Take 1 tablet (100 mg total) by mouth 2 (two) times daily for 7 days. 07/31/20 08/07/20  Fatima Blank, MD  fenofibrate 160 MG tablet Take 160 mg by mouth daily.     [provider]  Glucose Blood (  BAYER CONTOUR NEXT TEST VI) glucose testing    [provider]  insulin regular human CONCENTRATED (HUMULIN R) 500 UNIT/ML injection Inject into the skin continuous. Using insulin pump    [provider]  isosorbide mononitrate (IMDUR) 30 MG 24 hr tablet Take 1 tablet (30 mg total) by mouth daily. 11/12/19 05/10/20  Pixie Casino, MD  metFORMIN (GLUCOPHAGE) 1000 MG tablet Take 1,000 mg by mouth 2 (two) times daily with a meal.    [provider]  metoprolol succinate (TOPROL-XL) 25 MG 24 hr tablet Take 3 tablets (75 mg total) by mouth 2 (two) times daily. Take with or immediately following a meal. 06/18/19 06/12/20  Lorretta Harp, MD  nitroGLYCERIN (NITROSTAT) 0.4 MG SL tablet Place 1 tablet (0.4 mg total) under the tongue every 5 (five) minutes as needed. Patient taking differently: Place 0.4 mg under the tongue every 5 (five) minutes as needed for chest pain. 06/18/19   Lorretta Harp, MD  Propylene Glycol (SYSTANE BALANCE) 0.6 % SOLN Place 1 drop into both eyes daily as needed (dry eyes).    [provider]  sacubitril-valsartan (ENTRESTO) 24-26 MG Take 1 tablet by mouth 2 (two) times daily. 03/14/20   Bensimhon, [name], MD  spironolactone (ALDACTONE) 25 MG tablet Take 1 tablet (25 mg total) by mouth daily. 05/26/20   Bensimhon, [name], MD  torsemide (DEMADEX) 20 MG tablet Take 40 mg by mouth daily.    [provider]  VASCEPA 1 g capsule TAKE 2 CAPSULES BY MOUTH TWICE A DAY 12/08/19   Lorretta Harp, MD    Allergies    Reglan [metoclopramide] and Sglt2 inhibitors  Review of Systems   Review of Systems  Constitutional: Negative for fever.  Gastrointestinal: Negative for abdominal pain, nausea and vomiting.  Musculoskeletal: Negative for arthralgias and myalgias.  Skin: Positive for wound.  Allergic/Immunologic: Positive for immunocompromised state.  Neurological: Negative for weakness.  Hematological: Negative for adenopathy.  Psychiatric/Behavioral: Negative for confusion.  All other systems reviewed and are negative.   Physical Exam Updated Vital Signs BP 137/82 (BP Location: Left Arm)   Pulse 86   Temp 98.9 F (37.2 C) (Oral)   Resp 16   SpO2 97%   Physical Exam Vitals and nursing note reviewed.  Constitutional:      General: He is not in acute distress.    Appearance: He is well-developed. He is not diaphoretic.  HENT:     Head: Normocephalic and atraumatic.  Pulmonary:     Effort: Pulmonary effort is normal.  Abdominal:     Palpations: Abdomen is soft.     Tenderness: There is no abdominal tenderness.    Skin:    General: Skin is warm and dry.     Findings: No erythema or rash.  Neurological:     Mental Status: He is alert and oriented to person, place, and time.  Psychiatric:        Behavior: Behavior normal.     ED Results / Procedures / Treatments   Labs (all labs ordered are listed, but only  abnormal results are displayed) Labs Reviewed - No data to display  EKG None  Radiology No results found.  Procedures Procedures   Medications Ordered in ED Medications - No data to display  ED Course  I have reviewed the triage vital signs and the nursing notes.  Pertinent labs & imaging results that were available during my care of the patient were reviewed by me  and considered in my medical decision making (see chart for details).  Clinical Course as of 08/01/20 0754  Mon Aug 02, 8634  189 55 year old male here for wound check, I&D 3 days ago to right lower abdomen abscess.  Patient reports compliance with antibiotics.  Packing was removed without difficulty, no further purulent drainage present.  Plan is for discharge at this time, continue antibiotics and recheck with PCP in 2 days.  Return to ED for concerns. [LM]    Clinical Course User Index [LM] Roque Lias   MDM Rules/Calculators/A&P                          Final Clinical Impression(s) / ED Diagnoses Final diagnoses:  Wound check, abscess    Rx / DC Orders ED Discharge Orders    None       Tacy Learn, PA-C 08/01/20 [id number]    Sherwood Gambler, MD 08/02/20 [phone]

## 2020-08-01 NOTE — ED Triage Notes (Signed)
Pt reports he was seen here Friday for an infected abscess and was instructed to come back here today for further evaluation. PA at bedside to evaluate area. Area clean and dry

## 2020-08-03 ENCOUNTER — Other Ambulatory Visit: Payer: Self-pay | Admitting: Cardiology

## 2020-08-12 ENCOUNTER — Other Ambulatory Visit: Payer: Self-pay | Admitting: Cardiovascular Disease

## 2020-08-30 ENCOUNTER — Other Ambulatory Visit: Payer: Self-pay

## 2020-08-30 ENCOUNTER — Encounter (HOSPITAL_BASED_OUTPATIENT_CLINIC_OR_DEPARTMENT_OTHER): Payer: Commercial Managed Care - PPO | Attending: Internal Medicine | Admitting: Internal Medicine

## 2020-08-30 DIAGNOSIS — I5040 Unspecified combined systolic (congestive) and diastolic (congestive) heart failure: Secondary | ICD-10-CM | POA: Diagnosis not present

## 2020-08-30 DIAGNOSIS — T8131XD Disruption of external operation (surgical) wound, not elsewhere classified, subsequent encounter: Secondary | ICD-10-CM | POA: Insufficient documentation

## 2020-08-30 DIAGNOSIS — E11622 Type 2 diabetes mellitus with other skin ulcer: Secondary | ICD-10-CM | POA: Diagnosis present

## 2020-08-30 DIAGNOSIS — X58XXXA Exposure to other specified factors, initial encounter: Secondary | ICD-10-CM | POA: Diagnosis not present

## 2020-08-30 DIAGNOSIS — I110 Hypertensive heart disease with heart failure: Secondary | ICD-10-CM | POA: Diagnosis not present

## 2020-08-30 NOTE — Progress Notes (Signed)
GRYFFIN, ALTICE ([id number]) Visit Report for 08/30/2020 Abuse/Suicide Risk Screen Details Patient Name: Date of Service: Juan [name], [name] 08/30/2020 7:30 A M Medical Record Number: [id number] Patient Account Number: [id number] Date of Birth/Sex: Treating RN: 12/10/1965 (55 y.o. Male) Baruch Gouty Primary Care Amaar Oshita: Leeroy Cha Other Clinician: Referring Kerra Guilfoil: Treating Anamari Galeas/Extender: Marlow Baars Weeks in Treatment: 0 Abuse/Suicide Risk Screen Items Answer ABUSE RISK SCREEN: Has anyone close to you tried to hurt or harm you recentlyo No Do you feel uncomfortable with anyone in your familyo No Has anyone forced you do things that you didnt want to doo No Electronic Signature(s) Signed: 08/30/2020 6:10:01 PM By: Baruch Gouty RN, BSN Entered By: Baruch Gouty on 08/30/2020 08:06:28 -------------------------------------------------------------------------------- Activities of Daily Living Details Patient Name: Date of Service: [place], [place] V ID P. 08/30/2020 7:30 A M Medical Record Number: [id number] Patient Account Number: [id number] Date of Birth/Sex: Treating RN: [birth year] (55 y.o. Male) Baruch Gouty Primary Care Oshea Percival: Leeroy Cha Other Clinician: Referring Ritu Gagliardo: Treating Tanmay Halteman/Extender: Wynelle Bourgeois, Ronie Spies Weeks in Treatment: 0 Activities of Daily Living Items Answer Activities of Daily Living (Please select one for each item) Drive Automobile Completely Able T Medications ake Completely Able Use T elephone Completely Able Care for Appearance Completely Able Use T oilet Completely Able Bath / Shower Completely Able Dress Self Completely Able Feed Self Completely Able Walk Completely Able Get In / Out Bed Completely Able Housework Completely Able Prepare Meals Completely [place] for Self Completely Able Electronic Signature(s) Signed:  08/30/2020 6:10:01 PM By: Baruch Gouty RN, BSN Entered By: Baruch Gouty on 08/30/2020 08:06:52 -------------------------------------------------------------------------------- Education Screening Details Patient Name: Date of Service: [name], [name] 08/30/2020 7:30 A M Medical Record Number: [id number] Patient Account Number: [id number] Date of Birth/Sex: Treating RN: February 02, 1966 (54 y.o. Male) Baruch Gouty Primary Care Cybele Maule: Leeroy Cha Other Clinician: Referring Raneen Jaffer: Treating Zalmen Wrightsman/Extender: Gabriel [place] in Treatment: 0 Primary Learner Assessed: Patient Learning Preferences/Education Level/Primary Language Learning Preference: Explanation, Demonstration, Printed Material Highest Education Level: College or Above Preferred Language: English Cognitive Barrier Language Barrier: No Translator Needed: No Memory Deficit: No Emotional Barrier: No Cultural/Religious Beliefs Affecting Medical Care: No Physical Barrier Impaired Vision: Yes Glasses Impaired Hearing: No Decreased Hand dexterity: No Knowledge/Comprehension Knowledge Level: High Comprehension Level: High Ability to understand written instructions: High Ability to understand verbal instructions: High Motivation Anxiety Level: Calm Cooperation: Cooperative Education Importance: Acknowledges Need Interest in Health Problems: Asks Questions Perception: Coherent Willingness to Engage in Self-Management High Activities: Readiness to Engage in Self-Management High Activities: Electronic Signature(s) Signed: 08/30/2020 6:10:01 PM By: Baruch Gouty RN, BSN Entered By: Baruch Gouty on 08/30/2020 08:08:02 -------------------------------------------------------------------------------- Fall Risk Assessment Details Patient Name: Date of Service: [name], [name] 08/30/2020 7:30 A M Medical Record Number: [id number] Patient Account Number:  [id number] Date of Birth/Sex: Treating RN: 05/17/1965 (54 y.o. Male) Baruch Gouty Primary Care Camarion Weier: Leeroy Cha Other Clinician: Referring Calyn Rubi: Treating Mahlia Fernando/Extender: Marlow Baars Weeks in Treatment: 0 Fall Risk Assessment Items Have you had 2 or more falls in the last 12 monthso 0 No Have you had any fall that resulted in injury in the last 12 monthso 0 No FALLS RISK SCREEN History of falling - immediate or within 3 months 0 No Secondary diagnosis (Do you have 2 or more medical diagnoseso) 0 No Ambulatory aid None/bed rest/wheelchair/nurse 0 Yes Crutches/cane/walker 0 No Furniture 0 No Intravenous therapy Access/Saline/Heparin  Lock 0 No Gait/Transferring Normal/ bed rest/ wheelchair 0 Yes Weak (short steps with or without shuffle, stooped but able to lift head while walking, may seek 0 No support from furniture) Impaired (short steps with shuffle, may have difficulty arising from chair, head down, impaired 0 No balance) Mental Status Oriented to own ability 0 Yes Electronic Signature(s) Signed: 08/30/2020 6:10:01 PM By: Baruch Gouty RN, BSN Entered By: Baruch Gouty on 08/30/2020 08:08:40 -------------------------------------------------------------------------------- Foot Assessment Details Patient Name: Date of Service: [name], [name] 08/30/2020 7:30 A M Medical Record Number: [id number] Patient Account Number: [id number] Date of Birth/Sex: Treating RN: [birth year] (54 y.o. Male) Baruch Gouty Primary Care Artis Buechele: Leeroy Cha Other Clinician: Referring Enrique Weiss: Treating Damico Partin/Extender: Marlow Baars Weeks in Treatment: 0 Foot Assessment Items Site Locations + = Sensation present, - = Sensation absent, C = Callus, U = Ulcer R = Redness, W = Warmth, M = Maceration, PU = Pre-ulcerative lesion F = Fissure, S = Swelling, D = Dryness Assessment Right: Left: Other  Deformity: No No Prior Foot Ulcer: No No Prior Amputation: No No Charcot Joint: No No Ambulatory Status: Ambulatory Without Help Gait: Steady Electronic Signature(s) Signed: 08/30/2020 6:10:01 PM By: Baruch Gouty RN, BSN Entered By: Baruch Gouty on 08/30/2020 08:09:37 -------------------------------------------------------------------------------- Nutrition Risk Screening Details Patient Name: Date of Service: [place], [name] 08/30/2020 7:30 A M Medical Record Number: [id number] Patient Account Number: [id number] Date of Birth/Sex: Treating RN: Aug 07, 1965 (54 y.o. Male) Baruch Gouty Primary Care Kentaro Alewine: Leeroy Cha Other Clinician: Referring Louann Hopson: Treating Stefanee Mckell/Extender: Wynelle Bourgeois, Ronie Spies Weeks in Treatment: 0 Height (in): 70 Weight (lbs): 230 Body Mass Index (BMI): 33 Nutrition Risk Screening Items Score Screening NUTRITION RISK SCREEN: I have an illness or condition that made me change the kind and/or amount of food I eat 0 No I eat fewer than two meals per day 0 No I eat few fruits and vegetables, or milk products 0 No I have three or more drinks of beer, liquor or wine almost every day 0 No I have tooth or mouth problems that make it hard for me to eat 0 No I don't always have enough money to buy the food I need 0 No I eat alone most of the time 0 No I take three or more different prescribed or over-the-counter drugs a day 1 Yes Without wanting to, I have lost or gained 10 pounds in the last six months 0 No I am not always physically able to shop, cook and/or feed myself 0 No Nutrition Protocols Good Risk Protocol 0 No interventions needed Moderate Risk Protocol High Risk Proctocol Risk Level: Good Risk Score: 1 Electronic Signature(s) Signed: 08/30/2020 6:10:01 PM By: Baruch Gouty RN, BSN Entered By: Baruch Gouty on 08/30/2020 08:09:15

## 2020-08-31 NOTE — Progress Notes (Signed)
Juan [name] ([id number]) Visit Report for 08/30/2020 Chief Complaint Document Details Patient Name: Date of Service: [place], [place] ID [name]. 08/30/2020 7:30 A M Medical Record Number: [id number] Patient Account Number: [id number] Date of Birth/Sex: Treating RN: January 13, 1966 (55 y.o. Male) Juan [name] Primary Care Provider: Leeroy [name] Other Clinician: Referring Provider: Treating Provider/Extender: Juan [name]: 0 Information Obtained from: Patient Chief Complaint 08/30/2020; patient is here for review of a small wound in the right lower abdominal quadrant Electronic Signature(s) Signed: 08/30/2020 4:29:11 PM By: Juan Ham MD Entered By: Juan [name] on 08/30/2020 08:52:17 -------------------------------------------------------------------------------- HPI Details Patient Name: Date of Service: Juan [name], Juan [name]. 08/30/2020 7:30 A M Medical Record Number: [id number] Patient Account Number: [id number] Date of Birth/Sex: Treating RN: 1965/09/14 (55 y.o. Male) Juan [name] Primary Care Provider: Leeroy [name] Other Clinician: Referring Provider: Treating Provider/Extender: Juan [name] in [name]: 0 History of Present Illness HPI Description: ADMISSION 08/30/2020 This is a 55 year old man with type 2 diabetes on insulin. Problem began in early May when he developed an infection in the left lower quadrant caused by a his insulin pump. 3 days later he had a similar area in the right lower quadrant. He spent a short period of time in the hospital from 07/21/2020 through 07/23/2020 diagnosed with cellulitis of the abdominal wall in the right and left lower quadrant insulin pump sites. He received vancomycin and I believe discharged with doxycycline and I do Augmentin for 7 days his primary doctor had apparently already put him on Bactrim. CT scan of the abdominal wall on 5/5  showed diffuse subcutaneous soft tissue stranding involving the ventral abdominal wall and bilateral lateral walls. Imaging was consistent with cellulitis without focal fluid collections. In spite of the latter on 5/12 he was in the ED and required an IandD of the right lower quadrant abscess site. I cannot find that a culture was actually done. He was continued on Augmentin and doxycycline. He used wet-to-dry dressings Past medical history includes type 2 diabetes, systolic and diastolic heart failure, coronary artery disease status post CABG and stenting, nonalcoholic steatohepatitis with cirrhosis, chronic pancreatitis, hyperlipidemia, hypertension, remote history of non-Hodgkin's lymphoma but no clear. Cervical fusion Electronic Signature(s) Signed: 08/30/2020 4:29:11 PM By: Juan Ham MD Entered By: Juan [name] on 08/30/2020 08:58:41 -------------------------------------------------------------------------------- Physical Exam Details Patient Name: Date of Service: Juan Juan [name]. 08/30/2020 7:30 A M Medical Record Number: [id number] Patient Account Number: [id number] Date of Birth/Sex: Treating RN: 1966-03-13 (55 y.o. Male) Juan [name] Primary Care Provider: Leeroy [name] Other Clinician: Referring Provider: Treating Provider/Extender: Juan [name]: 0 Constitutional Sitting or standing Blood Pressure is within target range for patient.. Pulse regular and within target range for patient.Marland Kitchen Respirations regular, non-labored and within target range.Marland Kitchen Appears in no distress. Gastrointestinal (GI) Slightly distended. Around the open wound in the right lower quadrant there is some firmness to the left but absolutely no tenderness no evidence of an abscess.. Notes Wound exam; the patient has a small open wound in the right lower quadrant. Baseline granulation looks healthy. There is absolutely no probing depth to  this. Slightly to the left of the wound there is a firm area in the subcutaneous tissue but no erythema no tenderness no warmth certainly not ballotable. I do not think this represents anything major here. I see no evidence of continuing infection in the wound or in the soft tissue around it. His insulin  pump is now in his left lower quadrant. Electronic Signature(s) Signed: 08/30/2020 4:29:11 PM By: Juan Ham MD Entered By: Juan [name] on 08/30/2020 09:03:38 -------------------------------------------------------------------------------- Physician Orders Details Patient Name: Date of Service: Juan Juan [name]. 08/30/2020 7:30 A M Medical Record Number: [id number] Patient Account Number: [id number] Date of Birth/Sex: Treating RN: [birth year] (55 y.o. Male) Juan [name] Primary Care Provider: Leeroy [name] Other Clinician: Referring Provider: Treating Provider/Extender: Juan [name] in [name]: 0 Verbal / Phone Orders: No Diagnosis Coding Follow-up Appointments Return Appointment in 1 week. Bathing/ Shower/ Hygiene May shower with protection but do not get wound dressing(s) wet. Wound [name] Wound #1 - Abdomen - Lower Quadrant Wound Laterality: Right Cleanser: Soap and Water Every Other Day/7 Days Discharge Instructions: May shower and wash wound with dial antibacterial soap and water prior to dressing change. Cleanser: Wound Cleanser Every Other Day/7 Days Discharge Instructions: Cleanse the wound with wound cleanser prior to applying a clean dressing using gauze sponges, not tissue or cotton balls. Prim Dressing: Promogran Prisma Matrix, 4.34 (sq in) (silver collagen) Every Other Day/7 Days ary Discharge Instructions: Moisten collagen with saline or hydrogel Secondary Dressing: Zetuvit Plus Silicone Border Dressing 4x4 (in/in) Every Other Day/7 Days Discharge Instructions: Apply silicone border over primary dressing as  directed. Electronic Signature(s) Signed: 08/30/2020 4:29:11 PM By: Juan Ham MD Signed: 08/31/2020 6:22:58 PM By: Juan Hammock RN Entered By: Juan [name] on 08/30/2020 08:45:03 -------------------------------------------------------------------------------- Problem List Details Patient Name: Date of Service: Juan Juan [name]. 08/30/2020 7:30 A M Medical Record Number: [id number] Patient Account Number: [id number] Date of Birth/Sex: Treating RN: 1965-11-05 (55 y.o. Male) Juan [name] Primary Care Provider: Leeroy [name] Other Clinician: Referring Provider: Treating Provider/Extender: Juan [name] in [name]: 0 Active Problems ICD-10 Encounter Code Description Active Date MDM Diagnosis T81.31XD Disruption of external operation (surgical) wound, not elsewhere classified, 08/30/2020 No Yes subsequent encounter S31.103D Unspecified open wound of abdominal wall, right lower quadrant without 08/30/2020 No Yes penetration into peritoneal cavity, subsequent encounter L03.311 Cellulitis of abdominal wall 08/30/2020 No Yes Inactive Problems Resolved Problems Electronic Signature(s) Signed: 08/30/2020 4:29:11 PM By: Juan Ham MD Entered By: Juan [name] on 08/30/2020 08:51:07 -------------------------------------------------------------------------------- Progress Note Details Patient Name: Date of Service: Juan Juan [name]. 08/30/2020 7:30 A M Medical Record Number: [id number] Patient Account Number: [id number] Date of Birth/Sex: Treating RN: [birth year] (54 y.o. Male) Juan [name] Primary Care Provider: Leeroy [name] Other Clinician: Referring Provider: Treating Provider/Extender: Wynelle Bourgeois, Ronie Spies Weeks in [name]: 0 Subjective Chief Complaint Information obtained from Patient 08/30/2020; patient is here for review of a small wound in the right lower abdominal quadrant History of  Present Illness (HPI) ADMISSION 08/30/2020 This is a 55 year old man with type 2 diabetes on insulin. Problem began in early May when he developed an infection in the left lower quadrant caused by a his insulin pump. 3 days later he had a similar area in the right lower quadrant. He spent a short period of time in the hospital from 07/21/2020 through 07/23/2020 diagnosed with cellulitis of the abdominal wall in the right and left lower quadrant insulin pump sites. He received vancomycin and I believe discharged with doxycycline and I do Augmentin for 7 days his primary doctor had apparently already put him on Bactrim. CT scan of the abdominal wall on 5/5 showed diffuse subcutaneous soft tissue stranding involving the ventral abdominal wall and bilateral lateral walls. Imaging was consistent with cellulitis without focal fluid  collections. In spite of the latter on 5/12 he was in the ED and required an IandD of the right lower quadrant abscess site. I cannot find that a culture was actually done. He was continued on Augmentin and doxycycline. He used wet-to-dry dressings Past medical history includes type 2 diabetes, systolic and diastolic heart failure, coronary artery disease status post CABG and stenting, nonalcoholic steatohepatitis with cirrhosis, chronic pancreatitis, hyperlipidemia, hypertension, remote history of non-Hodgkin's lymphoma but no clear. Cervical fusion Patient History Information obtained from Patient. Allergies Reglan (Reaction: hyperactivity) Family History Unknown History. Social History Never smoker, Marital Status - Married, Alcohol Use - Never, Drug Use - No History, Caffeine Use - Daily - soda. Medical History Eyes Denies history of Cataracts, Glaucoma, Optic Neuritis Ear/Nose/Mouth/Throat Patient has history of Chronic sinus problems/congestion Denies history of Middle ear problems Hematologic/Lymphatic Patient has history of Anemia Respiratory Denies history of  Aspiration, Asthma, Chronic Obstructive Pulmonary Disease (COPD), Pneumothorax, Sleep Apnea, Tuberculosis Cardiovascular Patient has history of Congestive Heart Failure, Coronary Artery Disease, Hypertension Endocrine Patient has history of Type II Diabetes Denies history of Type I Diabetes Integumentary (Skin) Denies history of History of Burn Musculoskeletal Denies history of Gout, Rheumatoid Arthritis, Osteoarthritis, Osteomyelitis Oncologic Patient has history of Received Chemotherapy, Received Radiation Psychiatric Denies history of Anorexia/bulimia, Confinement Anxiety Patient is treated with Insulin, Oral Agents. Blood sugar is tested. Hospitalization/Surgery History - anterior cervical fusion. - CABG. - cardiac stents. - elbow surgery. - inguinal hernia repair. - knee arthroscopy. - cardiac cath with stents. - nasal septum surgery. - tibia bone biopsy. - tonsillectomy. Medical A Surgical History Notes nd Eyes retinopathy Cardiovascular hyperlipidemia Gastrointestinal hx pancreatitis, fatty liver, splenomegaly Genitourinary bil renal cysts Oncologic hx non Hodgkin's lymphoma Review of Systems (ROS) Constitutional Symptoms (General Health) Denies complaints or symptoms of Fatigue, Fever, Chills, Marked Weight Change. Eyes Complains or has symptoms of Glasses / Contacts. Denies complaints or symptoms of Dry Eyes, Vision Changes. Ear/Nose/Mouth/Throat Complains or has symptoms of Chronic sinus problems or rhinitis. Respiratory Complains or has symptoms of Shortness of Breath - occaissional. Denies complaints or symptoms of Chronic or frequent coughs. Cardiovascular Denies complaints or symptoms of Chest pain. Gastrointestinal Denies complaints or symptoms of Frequent diarrhea, Nausea, Vomiting. Endocrine Denies complaints or symptoms of Heat/cold intolerance. Genitourinary Denies complaints or symptoms of Frequent urination. Integumentary (Skin) Complains or has  symptoms of Wounds - abdomen. Musculoskeletal Denies complaints or symptoms of Muscle Pain, Muscle Weakness. Neurologic Denies complaints or symptoms of Numbness/parasthesias. Psychiatric Denies complaints or symptoms of Claustrophobia, Suicidal. Objective Constitutional Sitting or standing Blood Pressure is within target range for patient.. Pulse regular and within target range for patient.Marland Kitchen Respirations regular, non-labored and within target range.Marland Kitchen Appears in no distress. Vitals Time Taken: 7:49 AM, Height: 70 in, Source: Stated, Weight: 230 lbs, Source: Stated, BMI: 33, Temperature: 98.3 F, Pulse: 82 bpm, Respiratory Rate: 18 breaths/min, Blood Pressure: 137/81 mmHg, Capillary Blood Glucose: 180 mg/dl. General Notes: glucose per pt report yesterday am Gastrointestinal (GI) Slightly distended. Around the open wound in the right lower quadrant there is some firmness to the left but absolutely no tenderness no evidence of an abscess.. General Notes: Wound exam; the patient has a small open wound in the right lower quadrant. Baseline granulation looks healthy. There is absolutely no probing depth to this. Slightly to the left of the wound there is a firm area in the subcutaneous tissue but no erythema no tenderness no warmth certainly not ballotable. I do not think this represents anything  major here. I see no evidence of continuing infection in the wound or in the soft tissue around it. His insulin pump is now in his left lower quadrant. Integumentary (Hair, Skin) Wound #1 status is Open. Original cause of wound was Bump. The date acquired was: 07/19/2020. The wound is located on the Right Abdomen - Lower Quadrant. The wound measures 0.5cm length x 0.2cm width x 0.1cm depth; 0.079cm^2 area and 0.008cm^3 volume. There is Fat Layer (Subcutaneous Tissue) exposed. There is no tunneling or undermining noted. There is a none present amount of drainage noted. The wound margin is flat and intact.  There is small (1-33%) red granulation within the wound bed. There is no necrotic tissue within the wound bed. Assessment Active Problems ICD-10 Disruption of external operation (surgical) wound, not elsewhere classified, subsequent encounter Unspecified open wound of abdominal wall, right lower quadrant without penetration into peritoneal cavity, subsequent encounter Cellulitis of abdominal wall Plan Follow-up Appointments: Return Appointment in 1 week. Bathing/ Shower/ Hygiene: May shower with protection but do not get wound dressing(s) wet. WOUND #1: - Abdomen - Lower Quadrant Wound Laterality: Right Cleanser: Soap and Water Every Other Day/7 Days Discharge Instructions: May shower and wash wound with dial antibacterial soap and water prior to dressing change. Cleanser: Wound Cleanser Every Other Day/7 Days Discharge Instructions: Cleanse the wound with wound cleanser prior to applying a clean dressing using gauze sponges, not tissue or cotton balls. Prim Dressing: Promogran Prisma Matrix, 4.34 (sq in) (silver collagen) Every Other Day/7 Days ary Discharge Instructions: Moisten collagen with saline or hydrogel Secondary Dressing: Zetuvit Plus Silicone Border Dressing 4x4 (in/in) Every Other Day/7 Days Discharge Instructions: Apply silicone border over primary dressing as directed. 1. Small wound with absolutely no probing depth 2. Result of a cellulitis of the abdominal wall status post IandD in this area done in the ER on 5/16. I see no evidence of current infection clinically today 3. I do not believe he needs any additional antibiotics 4. He has not been putting any dressing on this area since it stopped draining about 4 days ago. I suggested moistened silver collagen covered with a thick Band-Aid or border foam. He can change this every second day. 5. Gave him a follow-up appointment for 2 weeks but I think this might be closed by then. I have asked him to call us and let us know  that if he is comfortable doing that. I spent 30 minutes in review of this patient's past medical history, face-to-face evaluation and preparation of this record Electronic Signature(s) Signed: 08/30/2020 9:04:18 AM By: Juan Ham MD Entered By: Juan [name] on 08/30/2020 09:04:18 -------------------------------------------------------------------------------- HxROS Details Patient Name: Date of Service: Juan Juan [name]. 08/30/2020 7:30 A M Medical Record Number: [id number] Patient Account Number: [id number] Date of Birth/Sex: Treating RN: 31-Aug-1965 (55 y.o. Male) Juan [name] Primary Care Provider: Leeroy [name] Other Clinician: Referring Provider: Treating Provider/Extender: Juan [name]: 0 Information Obtained From Patient Constitutional Symptoms (General Health) Complaints and Symptoms: Negative for: Fatigue; Fever; Chills; Marked Weight Change Eyes Complaints and Symptoms: Positive for: Glasses / Contacts Negative for: Dry Eyes; Vision Changes Medical History: Negative for: Cataracts; Glaucoma; Optic Neuritis Past Medical History Notes: retinopathy Ear/Nose/Mouth/Throat Complaints and Symptoms: Positive for: Chronic sinus problems or rhinitis Medical History: Positive for: Chronic sinus problems/congestion Negative for: Middle ear problems Respiratory Complaints and Symptoms: Positive for: Shortness of Breath - occaissional Negative for: Chronic or frequent coughs Medical History: Negative for: Aspiration;  Asthma; Chronic Obstructive Pulmonary Disease (COPD); Pneumothorax; Sleep Apnea; Tuberculosis Cardiovascular Complaints and Symptoms: Negative for: Chest pain Medical History: Positive for: Congestive Heart Failure; Coronary Artery Disease; Hypertension Past Medical History Notes: hyperlipidemia Gastrointestinal Complaints and Symptoms: Negative for: Frequent diarrhea; Nausea;  Vomiting Medical History: Past Medical History Notes: hx pancreatitis, fatty liver, splenomegaly Endocrine Complaints and Symptoms: Negative for: Heat/cold intolerance Medical History: Positive for: Type II Diabetes Negative for: Type I Diabetes Time with diabetes: 30 yrs Treated with: Insulin, Oral agents Blood sugar tested every day: Yes Tested : has dexicon Genitourinary Complaints and Symptoms: Negative for: Frequent urination Medical History: Past Medical History Notes: bil renal cysts Integumentary (Skin) Complaints and Symptoms: Positive for: Wounds - abdomen Medical History: Negative for: History of Burn Musculoskeletal Complaints and Symptoms: Negative for: Muscle Pain; Muscle Weakness Medical History: Negative for: Gout; Rheumatoid Arthritis; Osteoarthritis; Osteomyelitis Neurologic Complaints and Symptoms: Negative for: Numbness/parasthesias Psychiatric Complaints and Symptoms: Negative for: Claustrophobia; Suicidal Medical History: Negative for: Anorexia/bulimia; Confinement Anxiety Hematologic/Lymphatic Medical History: Positive for: Anemia Immunological Oncologic Medical History: Positive for: Received Chemotherapy; Received Radiation Past Medical History Notes: hx non Hodgkin's lymphoma HBO Extended History Items Ear/Nose/Mouth/Throat: Chronic sinus problems/congestion Immunizations Pneumococcal Vaccine: Received Pneumococcal Vaccination: Yes Implantable Devices Yes Hospitalization / Surgery History Type of Hospitalization/Surgery anterior cervical fusion CABG cardiac stents elbow surgery inguinal hernia repair knee arthroscopy cardiac cath with stents nasal septum surgery tibia bone biopsy tonsillectomy Family and Social History Unknown History: Yes; Never smoker; Marital Status - Married; Alcohol Use: Never; Drug Use: No History; Caffeine Use: Daily - soda; Financial Concerns: No; Food, Clothing or Shelter Needs: No; Support System  Lacking: No; Transportation Concerns: No Engineer, maintenance) Signed: 08/30/2020 4:29:11 PM By: Juan Ham MD Signed: 08/30/2020 6:10:01 PM By: Juan Gouty RN, BSN Entered By: Juan [name] on 08/30/2020 08:06:16 -------------------------------------------------------------------------------- [place] Details Patient Name: Date of Service: Juan Juan [name]. 08/30/2020 Medical Record Number: [id number] Patient Account Number: [id number] Date of Birth/Sex: Treating RN: February 22, 1966 (54 y.o. Male) Juan [name] Primary Care Provider: Leeroy [name] Other Clinician: Referring Provider: Treating Provider/Extender: Juan [name] in [name]: 0 Diagnosis Coding ICD-10 Codes Code Description T81.31XD Disruption of external operation (surgical) wound, not elsewhere classified, subsequent encounter S31.103D Unspecified open wound of abdominal wall, right lower quadrant without penetration into peritoneal cavity, subsequent encounter L03.311 Cellulitis of abdominal wall Facility Procedures CPT4 Code: [id number] Description: 99214 - WOUND CARE VISIT-LEV 4 EST PT Modifier: Quantity: 1 Physician Procedures : CPT4 Code Description Modifier [id number] WC PHYS LEVEL 3 NEW PT ICD-10 Diagnosis Description T81.31XD Disruption of external operation (surgical) wound, not elsewhere classified, subsequent encounter S31.103D Unspecified open wound of abdominal wall,  right lower quadrant without penetration into peritoneal cavi subsequent encounter L03.311 Cellulitis of abdominal wall Quantity: 1 ty, Electronic Signature(s) Signed: 08/30/2020 4:29:11 PM By: Juan Ham MD Entered By: Juan [name] on 08/30/2020 09:04:54

## 2020-08-31 NOTE — Progress Notes (Signed)
Juan [name], Juan [name] ([id number]) Visit Report for 08/30/2020 Allergy List Details Patient Name: Date of Service: Juan [name], Juan [name] ID P. 08/30/2020 7:30 A M Medical Record Number: [id number] Patient Account Number: [id number] Date of Birth/Sex: Treating RN: Feb 19, 1966 (55 y.o. Male) Baruch Gouty Primary Care Jermeka Schlotterbeck: Leeroy Cha Other Clinician: Referring Chrissie Dacquisto: Treating Besse Miron/Extender: Wynelle Bourgeois, Ronie Spies Weeks in Treatment: 0 Allergies Active Allergies Reglan Reaction: hyperactivity Allergy Notes Electronic Signature(s) Signed: 08/30/2020 6:10:01 PM By: Baruch Gouty RN, BSN Entered By: Baruch Gouty on 08/30/2020 07:55:38 -------------------------------------------------------------------------------- [place] Details Patient Name: Date of Service: Juan Adelfa Koh, [name] 08/30/2020 7:30 A M Medical Record Number: [id number] Patient Account Number: [id number] Date of Birth/Sex: Treating RN: 1965/05/28 (54 y.o. Male) Baruch Gouty Primary Care Dachelle Molzahn: Leeroy Cha Other Clinician: Referring Rosiland Sen: Treating Bernese Doffing/Extender: Gabriel Rainwater in Treatment: 0 Visit Information Patient Arrived: Ambulatory Arrival Time: 07:45 Accompanied By: self Transfer Assistance: None Patient Identification Verified: Yes Secondary Verification Process Completed: Yes Patient Requires Transmission-Based Precautions: No Patient Has Alerts: No Electronic Signature(s) Signed: 08/30/2020 6:10:01 PM By: Baruch Gouty RN, BSN Entered By: Baruch Gouty on 08/30/2020 07:49:20 -------------------------------------------------------------------------------- Clinic Level of Care Assessment Details Patient Name: Date of Service: Juan [name], [place] V ID P. 08/30/2020 7:30 A M Medical Record Number: [id number] Patient Account Number: [id number] Date of Birth/Sex: Treating RN: 12-19-1965 (54 y.o. Male) Rhae Hammock Primary  Care Maejor Erven: Leeroy Cha Other Clinician: Referring Dacia Capers: Treating Pericles Carmicheal/Extender: Marlow Baars Weeks in Treatment: 0 Clinic Level of Care Assessment Items TOOL 2 Quantity Score X- 1 0 Use when only an EandM is performed on the INITIAL visit ASSESSMENTS - Nursing Assessment / Reassessment X- 1 20 General Physical Exam (combine w/ comprehensive assessment (listed just below) when performed on new pt. evals) X- 1 25 Comprehensive Assessment (HX, ROS, Risk Assessments, Wounds Hx, etc.) ASSESSMENTS - Wound and Skin A ssessment / Reassessment X - Simple Wound Assessment / Reassessment - one wound 1 5 []  - 0 Complex Wound Assessment / Reassessment - multiple wounds []  - 0 Dermatologic / Skin Assessment (not related to wound area) ASSESSMENTS - Ostomy and/or Continence Assessment and Care []  - 0 Incontinence Assessment and Management []  - 0 Ostomy Care Assessment and Management (repouching, etc.) PROCESS - Coordination of Care X - Simple Patient / Family Education for ongoing care 1 15 []  - 0 Complex (extensive) Patient / Family Education for ongoing care X- 1 10 Staff obtains Programmer, systems, Records, T Results / Process Orders est []  - 0 Staff telephones HHA, Nursing Homes / Clarify orders / etc []  - 0 Routine Transfer to another Facility (non-emergent condition) []  - 0 Routine Hospital Admission (non-emergent condition) X- 1 15 New Admissions / Biomedical engineer / Ordering NPWT Apligraf, etc. , []  - 0 Emergency Hospital Admission (emergent condition) X- 1 10 Simple Discharge Coordination []  - 0 Complex (extensive) Discharge Coordination PROCESS - Special Needs []  - 0 Pediatric / Minor Patient Management []  - 0 Isolation Patient Management []  - 0 Hearing / Language / Visual special needs []  - 0 Assessment of Community assistance (transportation, D/C planning, etc.) []  - 0 Additional assistance / Altered mentation []  -  0 Support Surface(s) Assessment (bed, cushion, seat, etc.) INTERVENTIONS - Wound Cleansing / Measurement X- 1 5 Wound Imaging (photographs - any number of wounds) []  - 0 Wound Tracing (instead of photographs) X- 1 5 Simple Wound Measurement - one wound []  - 0 Complex Wound Measurement - multiple wounds X- 1 5  Simple Wound Cleansing - one wound []  - 0 Complex Wound Cleansing - multiple wounds INTERVENTIONS - Wound Dressings X - Small Wound Dressing one or multiple wounds 1 10 []  - 0 Medium Wound Dressing one or multiple wounds []  - 0 Large Wound Dressing one or multiple wounds []  - 0 Application of Medications - injection INTERVENTIONS - Miscellaneous []  - 0 External ear exam []  - 0 Specimen Collection (cultures, biopsies, blood, body fluids, etc.) []  - 0 Specimen(s) / Culture(s) sent or taken to Lab for analysis []  - 0 Patient Transfer (multiple staff / Harrel Lemon Lift / Similar devices) []  - 0 Simple Staple / Suture removal (25 or less) []  - 0 Complex Staple / Suture removal (26 or more) []  - 0 Hypo / Hyperglycemic Management (close monitor of Blood Glucose) []  - 0 Ankle / Brachial Index (ABI) - do not check if billed separately Has the patient been seen at the hospital within the last three years: Yes Total Score: 125 Level Of Care: New/Established - Level 4 Electronic Signature(s) Signed: 08/31/2020 6:22:58 PM By: Rhae Hammock RN Entered By: Rhae Hammock on 08/30/2020 08:45:50 -------------------------------------------------------------------------------- Encounter Discharge Information Details Patient Name: Date of Service: Juan [name], [name] 08/30/2020 7:30 A M Medical Record Number: [id number] Patient Account Number: [id number] Date of Birth/Sex: Treating RN: February 27, 1966 (55 y.o. Male) Lorrin Jackson Primary Care Jakeline Dave: Leeroy Cha Other Clinician: Referring Stella Bortle: Treating Agueda Houpt/Extender: Gabriel Rainwater  in Treatment: 0 Encounter Discharge Information Items Discharge Condition: Stable Ambulatory Status: Ambulatory Discharge Destination: Home Transportation: Private Auto Schedule Follow-up Appointment: Yes Clinical Summary of Care: Provided on 08/30/2020 Form Type Recipient Paper Patient Patient Electronic Signature(s) Signed: 08/30/2020 8:46:02 AM By: Lorrin Jackson Entered By: Lorrin Jackson on 08/30/2020 08:46:02 -------------------------------------------------------------------------------- Lower [place] Assessment Details Patient Name: Date of Service: Juan [name], [name] 08/30/2020 7:30 A M Medical Record Number: [id number] Patient Account Number: [id number] Date of Birth/Sex: Treating RN: 08/06/1965 (54 y.o. Male) Baruch Gouty Primary Care Samani Deal: Leeroy Cha Other Clinician: Referring Solangel Mcmanaway: Treating Tamla Winkels/Extender: Wynelle Bourgeois, Ronie Spies Weeks in Treatment: 0 Electronic Signature(s) Signed: 08/30/2020 6:10:01 PM By: Baruch Gouty RN, BSN Entered By: Baruch Gouty on 08/30/2020 08:09:44 -------------------------------------------------------------------------------- Multi Wound Chart Details Patient Name: Date of Service: Juan [name], [name] 08/30/2020 7:30 A M Medical Record Number: [id number] Patient Account Number: [id number] Date of Birth/Sex: Treating RN: [birth year] (54 y.o. Male) Rhae Hammock Primary Care Scotti Kosta: Leeroy Cha Other Clinician: Referring Jaequan Propes: Treating Tonesha Tsou/Extender: Wynelle Bourgeois, Rupashree Weeks in Treatment: 0 Vital Signs Height(in): 70 Capillary Blood Glucose(mg/dl): 180 Weight(lbs): 230 Pulse(bpm): 82 Body Mass Index(BMI): 33 Blood Pressure(mmHg): 137/81 Temperature(F): 98.3 Respiratory Rate(breaths/min): 18 Photos: [1:No Photos Right Abdomen - Lower Quadrant] [N/A:N/A N/A] Wound Location: [1:Bump] [N/A:N/A] Wounding Event: [1:Abscess] [N/A:N/A] Primary  Etiology: [1:Chronic sinus problems/congestion, N/A] Comorbid History: [1:Anemia, Congestive Heart Failure, Coronary Artery Disease, Hypertension, Type II Diabetes, Received Chemotherapy, Received Radiation 07/19/2020] [N/A:N/A] Date Acquired: [1:0] [N/A:N/A] Weeks of Treatment: [1:Open] [N/A:N/A] Wound Status: [1:0.5x0.2x0.1] [N/A:N/A] Measurements L x W x D (cm) [1:0.079] [N/A:N/A] A (cm) : rea [1:0.008] [N/A:N/A] Volume (cm) : [1:Full Thickness Without Exposed] [N/A:N/A] Classification: [1:Support Structures None Present] [N/A:N/A] Exudate Amount: [1:Flat and Intact] [N/A:N/A] Wound Margin: [1:Small (1-33%)] [N/A:N/A] Granulation Amount: [1:Red] [N/A:N/A] Granulation Quality: [1:None Present (0%)] [N/A:N/A] Necrotic Amount: [1:Fat Layer (Subcutaneous Tissue): Yes N/A] Exposed Structures: [1:Fascia: No Tendon: No Muscle: No Joint: No Bone: No Large (67-100%)] [N/A:N/A] Treatment Notes Wound #1 (Abdomen - Lower Quadrant) Wound Laterality:  Right Cleanser Wound Cleanser Discharge Instruction: Cleanse the wound with wound cleanser prior to applying a clean dressing using gauze sponges, not tissue or cotton balls. Soap and Water Discharge Instruction: May shower and wash wound with dial antibacterial soap and water prior to dressing change. Peri-Wound Care Topical Primary Dressing Promogran Prisma Matrix, 4.34 (sq in) (silver collagen) Discharge Instruction: Moisten collagen with saline or hydrogel Secondary Dressing Zetuvit Plus Silicone Border Dressing 4x4 (in/in) Discharge Instruction: Apply silicone border over primary dressing as directed. Secured With Compression Wrap Compression Stockings Environmental education officer) Signed: 08/30/2020 4:29:11 PM By: Linton Ham MD Signed: 08/31/2020 6:22:58 PM By: Rhae Hammock RN Entered By: Linton Ham on 08/30/2020  08:51:51 -------------------------------------------------------------------------------- Multi-Disciplinary Care Plan Details Patient Name: Date of Service: Juan [name], [place] V ID P. 08/30/2020 7:30 A M Medical Record Number: [id number] Patient Account Number: [id number] Date of Birth/Sex: Treating RN: 16-May-1965 (55 y.o. Male) Rhae Hammock Primary Care France Noyce: Leeroy Cha Other Clinician: Referring Nadezhda Pollitt: Treating Steph Cheadle/Extender: Gabriel Rainwater in Treatment: 0 Active Inactive Orientation to the Wound Care Program Nursing Diagnoses: Knowledge deficit related to the wound healing center program Goals: Patient/caregiver will verbalize understanding of the [place] Program Date Initiated: 08/30/2020 Target Resolution Date: 09/22/2020 Goal Status: Active Interventions: Provide education on orientation to the wound center Notes: Wound/Skin Impairment Nursing Diagnoses: Impaired tissue integrity Knowledge deficit related to ulceration/compromised skin integrity Goals: Patient will have a decrease in wound volume by X% from date: (specify in notes) Date Initiated: 08/30/2020 Target Resolution Date: 09/22/2020 Goal Status: Active Patient/caregiver will verbalize understanding of skin care regimen Date Initiated: 08/30/2020 Target Resolution Date: 09/22/2020 Goal Status: Active Ulcer/skin breakdown will have a volume reduction of 30% by week 4 Date Initiated: 08/30/2020 Target Resolution Date: 09/22/2020 Goal Status: Active Interventions: Assess patient/caregiver ability to obtain necessary supplies Assess patient/caregiver ability to perform ulcer/skin care regimen upon admission and as needed Assess ulceration(s) every visit Notes: Electronic Signature(s) Signed: 08/31/2020 6:22:58 PM By: Rhae Hammock RN Entered By: Rhae Hammock on 08/30/2020  08:43:16 -------------------------------------------------------------------------------- Pain Assessment Details Patient Name: Date of Service: Juan [name], [name] 08/30/2020 7:30 A M Medical Record Number: [id number] Patient Account Number: [id number] Date of Birth/Sex: Treating RN: 1965/09/06 (54 y.o. Male) Baruch Gouty Primary Care Kadia Abaya: Leeroy Cha Other Clinician: Referring Earsel Shouse: Treating Annajulia Lewing/Extender: Marlow Baars Weeks in Treatment: 0 Active Problems Location of Pain Severity and Description of Pain Patient Has Paino No Site Locations Rate the pain. Current Pain Level: 0 Pain Management and Medication Current Pain Management: Electronic Signature(s) Signed: 08/30/2020 6:10:01 PM By: Baruch Gouty RN, BSN Entered By: Baruch Gouty on 08/30/2020 08:16:06 -------------------------------------------------------------------------------- Patient/Caregiver Education Details Patient Name: Date of Service: Juan [name], [name] 6/14/2022andnbsp7:30 A M Medical Record Number: [id number] Patient Account Number: [id number] Date of Birth/Gender: Treating RN: 11-10-1965 (54 y.o. Male) Rhae Hammock Primary Care Physician: Leeroy Cha Other Clinician: Referring Physician: Treating Physician/Extender: Gabriel Rainwater in Treatment: 0 Education Assessment Education Provided To: Patient Education Topics Provided Welcome T [facility] [place]: o Methods: Explain/Verbal Responses: State content correctly Electronic Signature(s) Signed: 08/31/2020 6:22:58 PM By: Rhae Hammock RN Entered By: Rhae Hammock on 08/30/2020 08:43:24 -------------------------------------------------------------------------------- Wound Assessment Details Patient Name: Date of Service: Juan [name], [name] 08/30/2020 7:30 A M Medical Record Number: [id number] Patient Account Number: [id number] Date of  Birth/Sex: Treating RN: 1965/08/17 (54 y.o. Male) Baruch Gouty Primary Care Kekai Geter: Leeroy Cha Other Clinician: Referring Loretto Belinsky: Treating  Aracelia Brinson/Extender: Wynelle Bourgeois, Rupashree Weeks in Treatment: 0 Wound Status Wound Number: 1 Primary Abscess Etiology: Wound Location: Right Abdomen - Lower Quadrant Wound Open Wounding Event: Bump Status: Date Acquired: 07/19/2020 Comorbid Chronic sinus problems/congestion, Anemia, Congestive Heart Weeks Of Treatment: 0 History: Failure, Coronary Artery Disease, Hypertension, Type II Diabetes, Clustered Wound: No Received Chemotherapy, Received Radiation Photos Wound Measurements Length: (cm) 0.5 Width: (cm) 0.2 Depth: (cm) 0.1 Area: (cm) 0.079 Volume: (cm) 0.008 % Reduction in Area: 0% % Reduction in Volume: 0% Epithelialization: Large (67-100%) Tunneling: No Undermining: No Wound Description Classification: Full Thickness Without Exposed Support Structures Wound Margin: Flat and Intact Exudate Amount: None Present Foul Odor After Cleansing: No Slough/Fibrino No Wound Bed Granulation Amount: Small (1-33%) Exposed Structure Granulation Quality: Red Fascia Exposed: No Necrotic Amount: None Present (0%) Fat Layer (Subcutaneous Tissue) Exposed: Yes Tendon Exposed: No Muscle Exposed: No Joint Exposed: No Bone Exposed: No Treatment Notes Wound #1 (Abdomen - Lower Quadrant) Wound Laterality: Right Cleanser Wound Cleanser Discharge Instruction: Cleanse the wound with wound cleanser prior to applying a clean dressing using gauze sponges, not tissue or cotton balls. Soap and Water Discharge Instruction: May shower and wash wound with dial antibacterial soap and water prior to dressing change. Peri-Wound Care Topical Primary Dressing Promogran Prisma Matrix, 4.34 (sq in) (silver collagen) Discharge Instruction: Moisten collagen with saline or hydrogel Secondary Dressing Zetuvit Plus Silicone  Border Dressing 4x4 (in/in) Discharge Instruction: Apply silicone border over primary dressing as directed. Secured With Compression Wrap Compression Stockings Environmental education officer) Signed: 08/30/2020 4:42:14 PM By: Sandre Kitty Signed: 08/30/2020 6:10:01 PM By: Baruch Gouty RN, BSN Entered By: Sandre Kitty on 08/30/2020 16:09:16 -------------------------------------------------------------------------------- Vitals Details Patient Name: Date of Service: Juan [name], [name] 08/30/2020 7:30 A M Medical Record Number: [id number] Patient Account Number: [id number] Date of Birth/Sex: Treating RN: [birth year] (55 y.o. Male) Baruch Gouty Primary Care Butch Otterson: Leeroy Cha Other Clinician: Referring Shalicia Craghead: Treating Nastashia Gallo/Extender: Gabriel Rainwater in Treatment: 0 Vital Signs Time Taken: 07:49 Temperature (F): 98.3 Height (in): 70 Pulse (bpm): 82 Source: Stated Respiratory Rate (breaths/min): 18 Weight (lbs): 230 Blood Pressure (mmHg): 137/81 Source: Stated Capillary Blood Glucose (mg/dl): 180 Body Mass Index (BMI): 33 Reference Range: 80 - 120 mg / dl Notes glucose per pt report yesterday am Electronic Signature(s) Signed: 08/30/2020 6:10:01 PM By: Baruch Gouty RN, BSN Entered By: Baruch Gouty on 08/30/2020 07:54:17

## 2020-09-02 ENCOUNTER — Other Ambulatory Visit: Payer: Self-pay | Admitting: Cardiovascular Disease

## 2020-09-13 ENCOUNTER — Other Ambulatory Visit: Payer: Self-pay | Admitting: Internal Medicine

## 2020-09-13 ENCOUNTER — Other Ambulatory Visit: Payer: Self-pay | Admitting: *Deleted

## 2020-09-13 ENCOUNTER — Encounter (HOSPITAL_BASED_OUTPATIENT_CLINIC_OR_DEPARTMENT_OTHER): Payer: Commercial Managed Care - PPO | Admitting: Internal Medicine

## 2020-09-13 ENCOUNTER — Other Ambulatory Visit: Payer: Self-pay

## 2020-09-13 DIAGNOSIS — E11622 Type 2 diabetes mellitus with other skin ulcer: Secondary | ICD-10-CM | POA: Diagnosis not present

## 2020-09-13 DIAGNOSIS — E783 Hyperchylomicronemia: Secondary | ICD-10-CM

## 2020-09-13 DIAGNOSIS — E785 Hyperlipidemia, unspecified: Secondary | ICD-10-CM

## 2020-09-13 DIAGNOSIS — Z951 Presence of aortocoronary bypass graft: Secondary | ICD-10-CM

## 2020-09-13 NOTE — Progress Notes (Addendum)
Juan [name], Juan [name] ([id number]) Visit Report for 09/13/2020 HPI Details Patient Name: Date of Service: Juan [name], [name] 09/13/2020 3:30 PM Medical Record Number: [id number] Patient Account Number: [id number] Date of Birth/Sex: Treating RN: 23-Jun-1965 (55 y.o. Juan [name] Primary Care Provider: Leeroy Cha Other Clinician: Referring Provider: Treating Provider/Extender: Gabriel Rainwater in Treatment: 2 History of Present Illness HPI Description: ADMISSION 08/30/2020 This is a 55 year old man with type 2 diabetes on insulin. Problem began in early May when he developed an infection in the left lower quadrant caused by a his insulin pump. 3 days later he had a similar area in the right lower quadrant. He spent a short period of time in the hospital from 07/21/2020 through 07/23/2020 diagnosed with cellulitis of the abdominal wall in the right and left lower quadrant insulin pump sites. He received vancomycin and I believe discharged with doxycycline and I do Augmentin for 7 days his primary doctor had apparently already put him on Bactrim. CT scan of the abdominal wall on 5/5 showed diffuse subcutaneous soft tissue stranding involving the ventral abdominal wall and bilateral lateral walls. Imaging was consistent with cellulitis without focal fluid collections. In spite of the latter on 5/12 he was in the ED and required an IandD of the right lower quadrant abscess site. I cannot find that a culture was actually done. He was continued on Augmentin and doxycycline. He used wet-to-dry dressings Past medical history includes type 2 diabetes, systolic and diastolic heart failure, coronary artery disease status post CABG and stenting, nonalcoholic steatohepatitis with cirrhosis, chronic pancreatitis, hyperlipidemia, hypertension, remote history of non-Hodgkin's lymphoma but no clear. Cervical fusion 6/28; the patient's wound on the right lower abdominal quadrant  secondary to cellulitis initiated by his insulin pump is closed. Furthermore the tissue that was so firm around this area last time is now soft and pliable. No worries here Electronic Signature(s) Signed: 09/13/2020 5:29:17 PM By: Linton Ham MD Entered By: Linton Ham on 09/13/2020 16:59:21 -------------------------------------------------------------------------------- Physical Exam Details Patient Name: Date of Service: [name], DA [name] ID P. 09/13/2020 3:30 PM Medical Record Number: [id number] Patient Account Number: [id number] Date of Birth/Sex: Treating RN: 06-10-1965 (55 y.o. Juan [name] Primary Care Provider: Leeroy Cha Other Clinician: Referring Provider: Treating Provider/Extender: Gabriel Rainwater in Treatment: 2 Constitutional Sitting or standing Blood Pressure is within target range for patient.. Pulse regular and within target range for patient.Marland Kitchen Respirations regular, non-labored and within target range.. Temperature is normal and within the target range for the patient.Marland Kitchen Appears in no distress. Notes Wound exam; the patient had a small open wound 2 weeks ago in the right lower abdominal quadrant this is now closed. Palpation of the tissue around this reveals this to be soft and nontender. There was a firmness about this last time which seems to have gotten a lot better. Electronic Signature(s) Signed: 09/13/2020 5:29:17 PM By: Linton Ham MD Entered By: Linton Ham on 09/13/2020 17:00:38 -------------------------------------------------------------------------------- Physician Orders Details Patient Name: Date of Service: [name], DA [name] ID P. 09/13/2020 3:30 PM Medical Record Number: [id number] Patient Account Number: [id number] Date of Birth/Sex: Treating RN: Aug 12, 1965 (55 y.o. Juan [name] Primary Care Provider: Leeroy Cha Other Clinician: Referring Provider: Treating Provider/Extender:  Gabriel Rainwater in Treatment: 2 Verbal / Phone Orders: No Diagnosis Coding Follow-up Appointments Other: - No need to follow up!!:) Call us if you have any future questions or concerns!!:):):) Discharge From [facility] Services Discharge from Wound  [place] Signature(s) Signed: 09/13/2020 5:29:17 PM By: Linton Ham MD Signed: 09/13/2020 5:48:53 PM By: Rhae Hammock RN Entered By: Rhae Hammock on 09/13/2020 16:17:01 -------------------------------------------------------------------------------- Problem List Details Patient Name: Date of Service: [name], DA [name] ID P. 09/13/2020 3:30 PM Medical Record Number: [id number] Patient Account Number: [id number] Date of Birth/Sex: Treating RN: May 21, 1965 (55 y.o. Juan [name], Juan [name] Primary Care Provider: Leeroy Cha Other Clinician: Referring Provider: Treating Provider/Extender: Gabriel Rainwater in Treatment: 2 Active Problems ICD-10 Encounter Code Description Active Date MDM Diagnosis T81.31XD Disruption of external operation (surgical) wound, not elsewhere classified, 08/30/2020 No Yes subsequent encounter S31.103D Unspecified open wound of abdominal wall, right lower quadrant without 08/30/2020 No Yes penetration into peritoneal cavity, subsequent encounter L03.311 Cellulitis of abdominal wall 08/30/2020 No Yes Inactive Problems Resolved Problems Electronic Signature(s) Signed: 09/13/2020 5:29:17 PM By: Linton Ham MD Entered By: Linton Ham on 09/13/2020 16:58:27 -------------------------------------------------------------------------------- Progress Note Details Patient Name: Date of Service: [name], DA [name] ID P. 09/13/2020 3:30 PM Medical Record Number: [id number] Patient Account Number: [id number] Date of Birth/Sex: Treating RN: [birth year] (55 y.o. Juan [name] Primary Care Provider: Leeroy Cha Other  Clinician: Referring Provider: Treating Provider/Extender: Gabriel Rainwater in Treatment: 2 Subjective History of Present Illness (HPI) ADMISSION 08/30/2020 This is a 55 year old man with type 2 diabetes on insulin. Problem began in early May when he developed an infection in the left lower quadrant caused by a his insulin pump. 3 days later he had a similar area in the right lower quadrant. He spent a short period of time in the hospital from 07/21/2020 through 07/23/2020 diagnosed with cellulitis of the abdominal wall in the right and left lower quadrant insulin pump sites. He received vancomycin and I believe discharged with doxycycline and I do Augmentin for 7 days his primary doctor had apparently already put him on Bactrim. CT scan of the abdominal wall on 5/5 showed diffuse subcutaneous soft tissue stranding involving the ventral abdominal wall and bilateral lateral walls. Imaging was consistent with cellulitis without focal fluid collections. In spite of the latter on 5/12 he was in the ED and required an IandD of the right lower quadrant abscess site. I cannot find that a culture was actually done. He was continued on Augmentin and doxycycline. He used wet-to-dry dressings Past medical history includes type 2 diabetes, systolic and diastolic heart failure, coronary artery disease status post CABG and stenting, nonalcoholic steatohepatitis with cirrhosis, chronic pancreatitis, hyperlipidemia, hypertension, remote history of non-Hodgkin's lymphoma but no clear. Cervical fusion 6/28; the patient's wound on the right lower abdominal quadrant secondary to cellulitis initiated by his insulin pump is closed. Furthermore the tissue that was so firm around this area last time is now soft and pliable. No worries here Objective Constitutional Sitting or standing Blood Pressure is within target range for patient.. Pulse regular and within target range for patient.Marland Kitchen  Respirations regular, non-labored and within target range.. Temperature is normal and within the target range for the patient.Marland Kitchen Appears in no distress. Vitals Time Taken: 3:50 PM, Height: 70 in, Weight: 230 lbs, BMI: 33, Temperature: 98.4 F, Pulse: 74 bpm, Respiratory Rate: 18 breaths/min, Blood Pressure: 112/67 mmHg, Capillary Blood Glucose: 400 mg/dl, Pulse Oximetry: 93 %. General Notes: had a spike for an unknown reason last night - has been over 400 since last night - did not eat lunch ; CBG still above 400 General Notes: Wound exam; the patient had a small open wound 2 weeks ago  in the right lower abdominal quadrant this is now closed. Palpation of the tissue around this reveals this to be soft and nontender. There was a firmness about this last time which seems to have gotten a lot better. Integumentary (Hair, Skin) Wound #1 status is Open. Original cause of wound was Bump. The date acquired was: 07/19/2020. The wound has been in treatment 2 weeks. The wound is located on the Right Abdomen - Lower Quadrant. The wound measures 0cm length x 0cm width x 0cm depth; 0cm^2 area and 0cm^3 volume. There is no tunneling or undermining noted. There is a none present amount of drainage noted. The wound margin is flat and intact. There is no granulation within the wound bed. There is no necrotic tissue within the wound bed. Assessment Active Problems ICD-10 Disruption of external operation (surgical) wound, not elsewhere classified, subsequent encounter Unspecified open wound of abdominal wall, right lower quadrant without penetration into peritoneal cavity, subsequent encounter Cellulitis of abdominal wall Plan Follow-up Appointments: Other: - No need to follow up!!:) Call us if you have any future questions or concerns!!:):):) Discharge From [facility] Services: Discharge from [place] 1. The patient to be discharged from the wound care center Electronic Signature(s) Signed: 09/13/2020 5:29:17  PM By: Linton Ham MD Entered By: Linton Ham on 09/13/2020 17:01:24 -------------------------------------------------------------------------------- SuperBill Details Patient Name: Date of Service: Desmond Dike, DA [name] ID P. 09/13/2020 Medical Record Number: [id number] Patient Account Number: [id number] Date of Birth/Sex: Treating RN: [birth year] (55 y.o. Juan [name], Juan [name] Primary Care Provider: Leeroy Cha Other Clinician: Referring Provider: Treating Provider/Extender: Gabriel Rainwater in Treatment: 2 Diagnosis Coding ICD-10 Codes Code Description T81.31XD Disruption of external operation (surgical) wound, not elsewhere classified, subsequent encounter S31.103D Unspecified open wound of abdominal wall, right lower quadrant without penetration into peritoneal cavity, subsequent encounter L03.311 Cellulitis of abdominal wall Facility Procedures CPT4 Code: [id number] Description: [phone] - WOUND CARE VISIT-LEV 2 EST PT Modifier: Quantity: 1 Physician Procedures : CPT4 Code Description Modifier [id number] - WC PHYS LEVEL 2 - EST PT ICD-10 Diagnosis Description T81.31XD Disruption of external operation (surgical) wound, not elsewhere classified, subsequent encounter S31.103D Unspecified open wound of abdominal  wall, right lower quadrant without penetration into peritoneal cavi subsequent encounter L03.311 Cellulitis of abdominal wall Quantity: 1 ty, Electronic Signature(s) Signed: 09/13/2020 5:29:17 PM By: Linton Ham MD Entered By: Linton Ham on 09/13/2020 17:01:43

## 2020-10-06 NOTE — Progress Notes (Signed)
Juan [name], Juan [name] ([id number]) Visit Report for 09/13/2020 Arrival Information Details Patient Name: Date of Service: [name], [name] 09/13/2020 3:30 PM Medical Record Number: [id number] Patient Account Number: [id number] Date of Birth/Sex: Treating RN: Jan 11, 1966 (55 y.o. Lorette Ang, Meta.Reding Primary Care Sumiye Hirth: Leeroy Cha Other Clinician: Referring Rafaelita Foister: Treating Kinesha Auten/Extender: Juan [name] in Treatment: 2 Visit Information History Since Last Visit All ordered tests and consults were completed: No Patient Arrived: Ambulatory Added or deleted any medications: No Arrival Time: 15:52 Any new allergies or adverse reactions: No Accompanied By: alone Had a fall or experienced change in No Transfer Assistance: None activities of daily living that may affect Patient Identification Verified: Yes risk of falls: Secondary Verification Process Completed: Yes Signs or symptoms of abuse/neglect since last visito No Patient Requires Transmission-Based Precautions: No Hospitalized since last visit: No Patient Has Alerts: No Implantable device outside of the clinic excluding No cellular tissue based products placed in the center since last visit: Has Dressing in Place as Prescribed: Yes Pain Present Now: No Electronic Signature(s) Signed: 10/06/2020 9:21:15 PM By: Deon Pilling Previous Signature: 10/03/2020 8:51:19 PM Version By: Deon Pilling Entered By: Deon Pilling on 10/06/2020 21:20:25 -------------------------------------------------------------------------------- Clinic Level of Care Assessment Details Patient Name: Date of Service: [name], Juan [name] ID P. 09/13/2020 3:30 PM Medical Record Number: [id number] Patient Account Number: [id number] Date of Birth/Sex: Treating RN: 19-Dec-1965 (55 y.o. Juan [name], [name] Primary Care Nylan Nakatani: Leeroy Cha Other Clinician: Referring Daejon Lich: Treating Saory Carriero/Extender: Juan [name] in Treatment: 2 Clinic Level of Care Assessment Items TOOL 4 Quantity Score X- 1 0 Use when only an EandM is performed on FOLLOW-UP visit ASSESSMENTS - Nursing Assessment / Reassessment X- 1 10 Reassessment of Co-morbidities (includes updates in patient status) X- 1 5 Reassessment of Adherence to Treatment Plan ASSESSMENTS - Wound and Skin A ssessment / Reassessment X - Simple Wound Assessment / Reassessment - one wound 1 5 []  - 0 Complex Wound Assessment / Reassessment - multiple wounds []  - 0 Dermatologic / Skin Assessment (not related to wound area) ASSESSMENTS - Focused Assessment []  - 0 Circumferential Edema Measurements - multi extremities []  - 0 Nutritional Assessment / Counseling / Intervention []  - 0 Lower Extremity Assessment (monofilament, tuning fork, pulses) []  - 0 Peripheral Arterial Disease Assessment (using hand held doppler) ASSESSMENTS - Ostomy and/or Continence Assessment and Care []  - 0 Incontinence Assessment and Management []  - 0 Ostomy Care Assessment and Management (repouching, etc.) PROCESS - Coordination of Care X - Simple Patient / Family Education for ongoing care 1 15 []  - 0 Complex (extensive) Patient / Family Education for ongoing care X- 1 10 Staff obtains Programmer, systems, Records, T Results / Process Orders est []  - 0 Staff telephones HHA, Nursing Homes / Clarify orders / etc []  - 0 Routine Transfer to another Facility (non-emergent condition) []  - 0 Routine Hospital Admission (non-emergent condition) []  - 0 New Admissions / Biomedical engineer / Ordering NPWT Apligraf, etc. , []  - 0 Emergency Hospital Admission (emergent condition) X- 1 10 Simple Discharge Coordination []  - 0 Complex (extensive) Discharge Coordination PROCESS - Special Needs []  - 0 Pediatric / Minor Patient Management []  - 0 Isolation Patient Management []  - 0 Hearing / Language / Visual special needs []  - 0 Assessment  of Community assistance (transportation, D/C planning, etc.) []  - 0 Additional assistance / Altered mentation []  - 0 Support Surface(s) Assessment (bed, cushion, seat, etc.) INTERVENTIONS - Wound Cleansing / Measurement  X - Simple Wound Cleansing - one wound 1 5 []  - 0 Complex Wound Cleansing - multiple wounds X- 1 5 Wound Imaging (photographs - any number of wounds) []  - 0 Wound Tracing (instead of photographs) X- 1 5 Simple Wound Measurement - one wound []  - 0 Complex Wound Measurement - multiple wounds INTERVENTIONS - Wound Dressings []  - 0 Small Wound Dressing one or multiple wounds []  - 0 Medium Wound Dressing one or multiple wounds []  - 0 Large Wound Dressing one or multiple wounds []  - 0 Application of Medications - topical []  - 0 Application of Medications - injection INTERVENTIONS - Miscellaneous []  - 0 External ear exam []  - 0 Specimen Collection (cultures, biopsies, blood, body fluids, etc.) []  - 0 Specimen(s) / Culture(s) sent or taken to Lab for analysis []  - 0 Patient Transfer (multiple staff / Civil Service fast streamer / Similar devices) []  - 0 Simple Staple / Suture removal (25 or less) []  - 0 Complex Staple / Suture removal (26 or more) []  - 0 Hypo / Hyperglycemic Management (close monitor of Blood Glucose) []  - 0 Ankle / Brachial Index (ABI) - do not check if billed separately X- 1 5 Vital Signs Has the patient been seen at the hospital within the last three years: Yes Total Score: 75 Level Of Care: New/Established - Level 2 Electronic Signature(s) Signed: 09/13/2020 5:48:53 PM By: Rhae Hammock RN Entered By: Rhae Hammock on 09/13/2020 16:21:08 -------------------------------------------------------------------------------- Encounter Discharge Information Details Patient Name: Date of Service: [name], [name] 09/13/2020 3:30 PM Medical Record Number: [id number] Patient Account Number: [id number] Date of Birth/Sex: Treating RN: 03/15/1966 (55 y.o.  Juan [name], [name] Primary Care Merrily Tegeler: Leeroy Cha Other Clinician: Referring Orissa Arreaga: Treating Verdine Grenfell/Extender: Juan [name] in Treatment: 2 Encounter Discharge Information Items Discharge Condition: Stable Ambulatory Status: Ambulatory Discharge Destination: Home Transportation: Private Auto Accompanied By: self Schedule Follow-up Appointment: Yes Clinical Summary of Care: Patient Declined Electronic Signature(s) Signed: 09/13/2020 5:48:53 PM By: Rhae Hammock RN Entered By: Rhae Hammock on 09/13/2020 16:21:54 -------------------------------------------------------------------------------- Lower Extremity Assessment Details Patient Name: Date of Service: [name], [name] 09/13/2020 3:30 PM Medical Record Number: [id number] Patient Account Number: [id number] Date of Birth/Sex: Treating RN: [birth year] (56 y.o. Hessie Diener Primary Care Jaylanie Boschee: Leeroy Cha Other Clinician: Referring Lamika Connolly: Treating Davette Nugent/Extender: Wynelle Bourgeois, Ronie Spies Weeks in Treatment: 2 Electronic Signature(s) Signed: 10/06/2020 9:21:15 PM By: Deon Pilling Previous Signature: 10/03/2020 8:51:19 PM Version By: Deon Pilling Entered By: Deon Pilling on 10/06/2020 21:20:41 -------------------------------------------------------------------------------- Multi Wound Chart Details Patient Name: Date of Service: [name], [name] 09/13/2020 3:30 PM Medical Record Number: [id number] Patient Account Number: [id number] Date of Birth/Sex: Treating RN: June 13, 1965 (55 y.o. Juan [name], [name] Primary Care Hillari Zumwalt: Leeroy Cha Other Clinician: Referring Griselle Rufer: Treating Donis Kotowski/Extender: Juan [name] in Treatment: 2 Vital Signs Height(in): 70 Capillary Blood Glucose(mg/dl): 400 Weight(lbs): 230 Pulse(bpm): 74 Body Mass Index(BMI): 33 Blood Pressure(mmHg):  112/67 Temperature(F): 98.4 Respiratory Rate(breaths/min): 18 Photos: [1:No Photos Right Abdomen - Lower Quadrant] [N/A:N/A N/A] Wound Location: [1:Bump] [N/A:N/A] Wounding Event: [1:Abscess] [N/A:N/A] Primary Etiology: [1:Chronic sinus problems/congestion,] [N/A:N/A] Comorbid History: [1:Anemia, Congestive Heart Failure, Coronary Artery Disease, Hypertension, Type II Diabetes, Received Chemotherapy, Received Radiation 07/19/2020] [N/A:N/A] Date Acquired: [1:2] [N/A:N/A] Weeks of Treatment: [1:Open] [N/A:N/A] Wound Status: [1:0x0x0] [N/A:N/A] Measurements L x W x D (cm) [1:0] [N/A:N/A] A (cm) : rea [1:0] [N/A:N/A] Volume (cm) : [1:100.00%] [N/A:N/A] % Reduction in Area: [1:100.00%] [N/A:N/A] % Reduction in  Volume: [1:Full Thickness Without Exposed] [N/A:N/A] Classification: [1:Support Structures None Present] [N/A:N/A] Exudate Amount: [1:Flat and Intact] [N/A:N/A] Wound Margin: [1:None Present (0%)] [N/A:N/A] Granulation Amount: [1:None Present (0%)] [N/A:N/A] Necrotic Amount: [1:Fascia: No] [N/A:N/A] Exposed Structures: [1:Fat Layer (Subcutaneous Tissue): No Tendon: No Muscle: No Joint: No Bone: No Large (67-100%)] [N/A:N/A] Treatment Notes Wound #1 (Abdomen - Lower Quadrant) Wound Laterality: Right Cleanser Peri-Wound Care Topical Primary Dressing Secondary Dressing Secured With Compression Wrap Compression Stockings Add-Ons Electronic Signature(s) Signed: 09/13/2020 5:29:17 PM By: Linton Ham MD Signed: 09/13/2020 5:48:53 PM By: Rhae Hammock RN Entered By: Linton Ham on 09/13/2020 16:58:35 -------------------------------------------------------------------------------- Multi-Disciplinary Care Plan Details Patient Name: Date of Service: Desmond Dike, [name] 09/13/2020 3:30 PM Medical Record Number: [id number] Patient Account Number: [id number] Date of Birth/Sex: Treating RN: 17-Mar-1966 (55 y.o. Erie [name] Primary Care Shamonica Schadt: Leeroy Cha Other Clinician: Referring Norely Schlick: Treating Renny Remer/Extender: Juan [name] in Treatment: 2 Active Inactive Electronic Signature(s) Signed: 09/13/2020 5:48:53 PM By: Rhae Hammock RN Entered By: Rhae Hammock on 09/13/2020 16:18:42 -------------------------------------------------------------------------------- Pain Assessment Details Patient Name: Date of Service: [place] Adelfa Koh, [name] 09/13/2020 3:30 PM Medical Record Number: [id number] Patient Account Number: [id number] Date of Birth/Sex: Treating RN: 1965-05-22 (55 y.o. Hessie Diener Primary Care Zuhair Lariccia: Leeroy Cha Other Clinician: Referring Nhia Heaphy: Treating Hays Dunnigan/Extender: Juan [name] in Treatment: 2 Active Problems Location of Pain Severity and Description of Pain Patient Has Paino No Site Locations Rate the pain. Current Pain Level: 0 Pain Management and Medication Current Pain Management: Electronic Signature(s) Signed: 10/06/2020 9:21:15 PM By: Deon Pilling Previous Signature: 10/03/2020 8:51:19 PM Version By: Deon Pilling Entered By: Deon Pilling on 10/06/2020 21:20:36 -------------------------------------------------------------------------------- Patient/Caregiver Education Details Patient Name: Date of Service: [name], [name] 6/28/2022andnbsp3:30 PM Medical Record Number: [id number] Patient Account Number: [id number] Date of Birth/Gender: Treating RN: 07-10-1965 (55 y.o. Erie [name] Primary Care Physician: Leeroy Cha Other Clinician: Referring Physician: Treating Physician/Extender: Juan [name] in Treatment: 2 Education Assessment Education Provided To: Patient Education Topics Provided Electronic Signature(s) Signed: 09/13/2020 5:48:53 PM By: Rhae Hammock RN Entered By: Rhae Hammock on 09/13/2020  16:20:41 -------------------------------------------------------------------------------- Wound Assessment Details Patient Name: Date of Service: [name], [name] 09/13/2020 3:30 PM Medical Record Number: [id number] Patient Account Number: [id number] Date of Birth/Sex: Treating RN: 07-18-1965 (55 y.o. Hessie Diener Primary Care Prairie Stenberg: Leeroy Cha Other Clinician: Referring Juaquina Machnik: Treating Kimari Coudriet/Extender: Juan [name] in Treatment: 2 Wound Status Wound Number: 1 Primary Abscess Etiology: Wound Location: Right Abdomen - Lower Quadrant Wound Healed - Epithelialized Wounding Event: Bump Status: Date Acquired: 07/19/2020 Comorbid Chronic sinus problems/congestion, Anemia, Congestive Heart Weeks Of Treatment: 2 History: Failure, Coronary Artery Disease, Hypertension, Type II Diabetes, Clustered Wound: No Received Chemotherapy, Received Radiation Wound Measurements Length: (cm) Width: (cm) Depth: (cm) Area: (cm) Volume: (cm) 0 % Reduction in Area: 100% 0 % Reduction in Volume: 100% 0 Epithelialization: Large (67-100%) 0 Tunneling: No 0 Undermining: No Wound Description Classification: Full Thickness Without Exposed Support Structures Wound Margin: Flat and Intact Exudate Amount: None Present Foul Odor After Cleansing: No Slough/Fibrino No Wound Bed Granulation Amount: None Present (0%) Exposed Structure Necrotic Amount: None Present (0%) Fascia Exposed: No Fat Layer (Subcutaneous Tissue) Exposed: No Tendon Exposed: No Muscle Exposed: No Joint Exposed: No Bone Exposed: No Treatment Notes Wound #1 (Abdomen - Lower Quadrant) Wound Laterality: Right Cleanser Peri-Wound Care Topical Primary Dressing Secondary Dressing Secured With Compression Wrap  Compression Stockings Add-Ons Electronic Signature(s) Signed: 10/06/2020 9:21:15 PM By: Deon Pilling Previous Signature: 10/03/2020 8:51:19 PM Version By: Deon Pilling Entered By: Deon Pilling on 10/06/2020 21:20:53 -------------------------------------------------------------------------------- Vitals Details Patient Name: Date of Service: [name], [name] 09/13/2020 3:30 PM Medical Record Number: [id number] Patient Account Number: [id number] Date of Birth/Sex: Treating RN: [birth year] (55 y.o. Hessie Diener Primary Care Ransom Nickson: Leeroy Cha Other Clinician: Referring Mann Skaggs: Treating [place] Perra/Extender: Juan [name] in Treatment: 2 Vital Signs Time Taken: 15:50 Temperature (F): 98.4 Height (in): 70 Pulse (bpm): 74 Weight (lbs): 230 Respiratory Rate (breaths/min): 18 Body Mass Index (BMI): 33 Blood Pressure (mmHg): 112/67 Capillary Blood Glucose (mg/dl): 400 Reference Range: 80 - 120 mg / dl Airway Pulse Oximetry (%): 93 Notes had a spike for an unknown reason last night - has been over 400 since last night - did not eat lunch ; CBG still above 400 Electronic Signature(s) Signed: 10/06/2020 9:21:15 PM By: Deon Pilling Previous Signature: 10/03/2020 8:51:19 PM Version By: Deon Pilling Entered By: Deon Pilling on 10/06/2020 21:20:30

## 2020-10-26 ENCOUNTER — Ambulatory Visit (HOSPITAL_COMMUNITY)
Admission: RE | Admit: 2020-10-26 | Discharge: 2020-10-26 | Disposition: A | Discharge status: Home / Self Care | Payer: Commercial Managed Care - PPO | Source: Ambulatory Visit | Attending: Family Medicine | Admitting: Family Medicine

## 2020-10-26 ENCOUNTER — Other Ambulatory Visit: Payer: Self-pay

## 2020-10-26 ENCOUNTER — Encounter (HOSPITAL_COMMUNITY): Payer: Self-pay

## 2020-10-26 VITALS — BP 118/68 | HR 85 | Wt 239.4 lb

## 2020-10-26 DIAGNOSIS — I340 Nonrheumatic mitral (valve) insufficiency: Secondary | ICD-10-CM | POA: Insufficient documentation

## 2020-10-26 DIAGNOSIS — I5042 Chronic combined systolic (congestive) and diastolic (congestive) heart failure: Secondary | ICD-10-CM | POA: Diagnosis not present

## 2020-10-26 DIAGNOSIS — E781 Pure hyperglyceridemia: Secondary | ICD-10-CM | POA: Insufficient documentation

## 2020-10-26 DIAGNOSIS — Z794 Long term (current) use of insulin: Secondary | ICD-10-CM | POA: Diagnosis not present

## 2020-10-26 DIAGNOSIS — Z9641 Presence of insulin pump (external) (internal): Secondary | ICD-10-CM | POA: Insufficient documentation

## 2020-10-26 DIAGNOSIS — Z951 Presence of aortocoronary bypass graft: Secondary | ICD-10-CM | POA: Insufficient documentation

## 2020-10-26 DIAGNOSIS — Z7982 Long term (current) use of aspirin: Secondary | ICD-10-CM | POA: Diagnosis not present

## 2020-10-26 DIAGNOSIS — E783 Hyperchylomicronemia: Secondary | ICD-10-CM | POA: Diagnosis not present

## 2020-10-26 DIAGNOSIS — I110 Hypertensive heart disease with heart failure: Secondary | ICD-10-CM | POA: Diagnosis not present

## 2020-10-26 DIAGNOSIS — I2510 Atherosclerotic heart disease of native coronary artery without angina pectoris: Secondary | ICD-10-CM | POA: Insufficient documentation

## 2020-10-26 DIAGNOSIS — Z7902 Long term (current) use of antithrombotics/antiplatelets: Secondary | ICD-10-CM | POA: Insufficient documentation

## 2020-10-26 DIAGNOSIS — Z79899 Other long term (current) drug therapy: Secondary | ICD-10-CM | POA: Insufficient documentation

## 2020-10-26 DIAGNOSIS — I5022 Chronic systolic (congestive) heart failure: Secondary | ICD-10-CM | POA: Insufficient documentation

## 2020-10-26 DIAGNOSIS — Z8719 Personal history of other diseases of the digestive system: Secondary | ICD-10-CM | POA: Diagnosis not present

## 2020-10-26 DIAGNOSIS — E119 Type 2 diabetes mellitus without complications: Secondary | ICD-10-CM | POA: Diagnosis not present

## 2020-10-26 DIAGNOSIS — Z955 Presence of coronary angioplasty implant and graft: Secondary | ICD-10-CM | POA: Insufficient documentation

## 2020-10-26 LAB — BASIC METABOLIC PANEL
Anion gap: 10 (ref 5–15)
BUN: 46 mg/dL — ABNORMAL HIGH (ref 6–20)
CO2: 23 mmol/L (ref 22–32)
Calcium: 10.1 mg/dL (ref 8.9–10.3)
Chloride: 102 mmol/L (ref 98–111)
Creatinine, Ser: 1.49 mg/dL — ABNORMAL HIGH (ref 0.61–1.24)
GFR, Estimated: 56 mL/min — ABNORMAL LOW (ref >60)
Glucose, Bld: 343 mg/dL — ABNORMAL HIGH (ref 70–99)
Potassium: 4.4 mmol/L (ref 3.5–5.1)
Sodium: 135 mmol/L (ref 135–145)

## 2020-10-26 LAB — BRAIN NATRIURETIC PEPTIDE: B Natriuretic Peptide: 339.9 pg/mL — ABNORMAL HIGH (ref 0.0–100.0)

## 2020-10-26 MED ORDER — ENTRESTO 49-51 MG PO TABS: 1.0000 | ORAL_TABLET | Freq: Two times a day (BID) | ORAL | 6 refills | Status: DC

## 2020-10-26 NOTE — Patient Instructions (Signed)
STOP Ramipril INCREASE Entresto to 49/51 mg, one tab twice a day INCREASE Torsemide to 40 mg twice a day for 3 days, then resume normal dose of 40 mg daily   Labs today We will only contact you if something comes back abnormal or we need to make some changes. Otherwise no news is good news!  Labs needed in 7-10 days  Your physician recommends that you schedule a follow-up appointment in: 6-8 weeks with Dr Haroldine Laws  Do the following things EVERYDAY: Weigh yourself in the morning before breakfast. Write it down and keep it in a log. Take your medicines as prescribed Eat low salt foods--Limit salt (sodium) to 2000 mg per day.  Stay as active as you can everyday Limit all fluids for the day to less than 2 liters  milAt the [facility], you and your health needs are our priority. As part of our continuing mission to provide you with exceptional heart care, we have created designated Provider Care Teams. These Care Teams include your primary Cardiologist (physician) and Advanced Practice Providers (APPs- Physician Assistants and Nurse Practitioners) who all work together to provide you with the care you need, when you need it.   You may see any of the following providers on your designated Care Team at your next follow up: Dr Glori Bickers Dr Loralie Champagne Dr Patrice Paradise, NP Lyda Jester, [place] Ginnie Smart Audry Riles, PharmD   Please be sure to bring in all your medications bottles to every appointment.

## 2020-10-26 NOTE — Progress Notes (Signed)
ReDS Vest / Clip - 10/26/20 1500       ReDS Vest / Clip   Station Marker D    Ruler Value 36    ReDS Value Range Moderate volume overload    ReDS Actual Value 40    Anatomical Comments sitting

## 2020-10-26 NOTE — Addendum Note (Signed)
Encounter addended by: Rafael Bihari, FNP on: 10/26/2020 5:01 PM  Actions taken: Clinical Note Signed

## 2020-10-26 NOTE — Progress Notes (Addendum)
ADVANCED HF CLINIC NOTE  PCP: Juan Cha, MD Primary Cardiologist: Juan [name] HF Cardiologist: Dr. Haroldine [name]  HPI:  Juan [name] is 55 y/o male w/ h/o systolic HF, EF [id number], 3VCAD s/p CABG in 2014 followed by stenting to SVG-RCA in 2018, DM2, severe hypertriglyceridemia w/ subsequent episodes of pancreatitis.  Seen by Dr. Gwenlyn [name] 02/09/2020 = for progressive dyspnea and wt gain which was being managed by increasing home diuretics w/o much improvement in volume or symptoms. Echo repeated showing stable LVEF at 45%. RV systolic function low normal. Mild-mod MR also noted. Given persistent symptoms, he had LHC that showed progression of his native and graft disease.  All vein grafts are functionally occluded with minimal left to right collaterals.  His LIMA is patent although his LAD is diffusely diseased.  RHC showed mPCWP mildly elevated at 19. LVEDP 23. CO by Fick 3.25, CI 1.48, by thermo calculation CO 7, CI 3.2. Juan [name] was markedly edematous and admitted for management of acute on chronic systolic heart failure with IV diuretics. In addition, he had TTE which showed only mild-mod MR.  Diuresed with IV lasix and transitioned to [facility].  Admitted 5/22 with cellulitis right lower abdomen after failing outpatient abx treatment. CT did not show deep abscesses. He was played on doxy and Augmentin. Hospitalization c/b AKI, SCr 2.1 initially but back down to 1.4 on discharge. His HF medications were not adjusted. D/c weight 234 lbs.  Today he returns for HF follow up. He has good days and bad days with breathing. Has not been taking extra torsemide for welling. Some SOB with lifting, can walk on flat ground without much dyspnea. Says his leg swelling is about the same. Has been drinking more fluid lately. Denies CP, dizziness, or PND/Orthopnea. Appetite ok. No fever or chills. Weight at home 233 pounds. Taking all medications.  Was started on ramipril recently; no worsening swallowing, cough or  throat swelling.  ROS: All systems negative except as listed in HPI, PMH and Problem List.  SH:  Social History   Socioeconomic History   Marital status: Married    Spouse name: Juan [name]   Number of children: 1   Years of education: 16   Highest education level: Not on file  Occupational History   Occupation: Engineer, drilling     Employer: [employer]  Tobacco Use   Smoking status: Never   Smokeless tobacco: Never  Vaping Use   Vaping Use: Never used  Substance and Sexual Activity   Alcohol use: No   Drug use: No   Sexual activity: Yes  Other Topics Concern   Not on file  Social History Narrative   Adopted, so no pertinent FH.  Married.  Lives with wife in [place].  Independent of ADLs and ambulation.   Social Determinants of Health   Financial Resource Strain: Not on file  Food Insecurity: Not on file  Transportation Needs: Not on file  Physical Activity: Not on file  Stress: Not on file  Social Connections: Not on file  Intimate Partner Violence: Not on file   FH:  Family History  Adopted: Yes   Past Medical History:  Diagnosis Date   Abnormal cardiovascular stress test 02/06/2013   Anemia    occasional - no problems currently(10/02/2011)   Bilateral renal cysts 06/16/2011   states no known problems   Blood transfusion without reported diagnosis    CAD (coronary artery disease), LAD 90%, 1st diag 95%, LCX 70% wth AV groove 90%, RCA 40-50%  mid and 80% long distal stenosis 02/03/13 02/04/2013   Chest pain    positive Myoview stress test   CHF (congestive heart failure) (HCC)    Coronary artery calcification seen on CAT scan    Diabetes mellitus    IDDM   Fatty liver disease, nonalcoholic 123456   Hyperlipidemia    Hypertension    states no dx. of HTN, takes med. to protect kidneys due to DM   Lateral meniscus tear 09/2011   left   Loose body in knee 09/2011   loose bodies left knee   Non Hodgkin's lymphoma ([place]) 1991   Pancreatitis     occasional - last episode 06/2011   S/P CABG x 4, 02/05/13 LIMA-LAD; LT. RADIAL-OM;VG-DIAG; VG-PDA 02/06/2013   10/18 3/4 patent grafts (occluded SVG-->Diag), PCI/DESx 3 SVG-->RCA, normal EF   Splenomegaly, congestive, chronic    Stuffy and runny nose 10/02/2011   yellow drainage from nose    Current Outpatient Medications  Medication Sig Dispense Refill   aspirin 81 MG chewable tablet Chew 1 tablet (81 mg total) by mouth daily.     clopidogrel (PLAVIX) 75 MG tablet TAKE 1 TABLET BY MOUTH EVERY DAY WITH BREAKFAST 90 tablet 3   Continuous Blood Gluc Receiver (DEXCOM G6 RECEIVER) DEVI See admin instructions.     fenofibrate 160 MG tablet Take 160 mg by mouth daily.      Glucose Blood (BAYER CONTOUR NEXT TEST VI) glucose testing     insulin regular human CONCENTRATED (HUMULIN R) 500 UNIT/ML injection Inject into the skin continuous. Using insulin pump     isosorbide mononitrate (IMDUR) 30 MG 24 hr tablet Take 1 tablet (30 mg total) by mouth daily. 90 tablet 3   metFORMIN (GLUCOPHAGE) 1000 MG tablet Take 1,000 mg by mouth 2 (two) times daily with a meal.     metoprolol succinate (TOPROL-XL) 25 MG 24 hr tablet TAKE 3 TABLETS BY MOUTH TWICE A DAY WITH OR IMMEDIATELY FOLLOWING A MEAL 540 tablet 3   nitroGLYCERIN (NITROSTAT) 0.4 MG SL tablet Place 1 tablet (0.4 mg total) under the tongue every 5 (five) minutes as needed. 25 tablet 2   Propylene Glycol (SYSTANE BALANCE) 0.6 % SOLN Place 1 drop into both eyes daily as needed (dry eyes).     sacubitril-valsartan (ENTRESTO) 24-26 MG Take 1 tablet by mouth 2 (two) times daily. 180 tablet 3   spironolactone (ALDACTONE) 25 MG tablet Take 1 tablet (25 mg total) by mouth daily. 90 tablet 3   torsemide (DEMADEX) 20 MG tablet TAKE 2 TABLETS BY MOUTH EVERY DAY 180 tablet 3   TRESIBA FLEXTOUCH 200 UNIT/ML FlexTouch Pen Inject 26 Units into the skin daily.     VASCEPA 1 g capsule TAKE 2 CAPSULES BY MOUTH TWICE A DAY 360 capsule 4   No current  facility-administered medications for this encounter.   BP 118/68   Pulse 85   Wt 108.6 kg   SpO2 96%   BMI 33.39 kg/m   Wt Readings from Last 3 Encounters:  10/26/20 108.6 kg  07/23/20 106.4 kg  06/03/20 109.3 kg   PHYSICAL EXAM: General:  NAD. No resp difficulty HEENT: Normal Neck: Supple. No JVD. Carotids 2+ bilat; no bruits. No lymphadenopathy or thryomegaly appreciated. Cor: PMI nondisplaced. Regular rate & rhythm. No rubs, gallops or murmurs. Lungs: Clear Abdomen: Obese, nontender, +distended. No hepatosplenomegaly. No bruits or masses. Good bowel sounds. Extremities: No cyanosis, clubbing, rash, 1-2+ BLE edema, compression socks on Neuro: Alert & oriented x 3,  cranial nerves grossly intact. Moves all 4 extremities w/o difficulty. Affect pleasant.  ECG: SR w/ 1st degree HB, rBBB, lateral TW abnormality (personally reviewed). ReDs: 40%  ASSESSMENT & PLAN:  1. Chronic Systolic Heart Failure - Echo 2017 showed normal LVEF 60-65% - Echo 4/21, EF mildly reduced 45%. NST showed scar but no ischemia - Echo 11/21, EF [id number], RV systolic function low normal  - RHC 11/21 showed mildly elevated filling pressures, PCWP 19. LVEDP 23. CO by Fick 3.25, CI 1.48, by thermo calculation CO 7, CI 3.2.  - Echo (4/22): EF 50-55%, RV moderately reduced.  - Improved NYHA III-> II. Mildly volume overloaded. ReDS 40% - Stop ramipril. Discussed w/ PharmD, no need to hold Entresto for 36 hrs. - Increase torsemide to 40 mg bid x 3 days, then back to 40 mg daily. - Increase Entresto to 49/51 mg bid.  - Continue spiro 25 mg daily (he prefers not to split tablets). - Continue Toprol 75 mg bid. - No SGLT2i w/ recent cellulitis w/ abscess requiring hospitalization (he is functionally DM1). - I think Cardiomems will help better manage his fluid and avoid ups/downs in his renal function. Will re-submit for insurance approval. - BMET & BNP today, repeat BMET in 10 days.    2. CAD - s/p CABG in 2014,  followed by PCI to [place] in 2018 - [place] 11/21 showed progression of native vessel CAD and grafts. All vein grafts are functionally occluded with minimal left to right collaterals.  His LIMA is patent although his LAD is diffusely diseased (diabetic). No targets for intervention and not a candidate for redo CABG.  - No s/s ischemia. - Cont ASA, Plavix, LA nitrate + ? blocker. Refusing statin.    3. Mitral Regurgitation  - Very mild MR noted on recent TEE 11/21 after diuresis  - Mild on recent echo 4/22.  4. IDDM - Has insulin pump.  - Managed by Dr. Debbora Presto. - see SGLT2i discussion above   5. Severe Hypertriglyceridemia  - Has been as high as 6,000 - Likely familial but he does not know family history (adopted).  - Has had multiple occurences of pancreatitis. - Now follows with Dr. Debara Pickett with diagnosis of severe hyperchylomicronemia. His testing for FCS was genetically negative. Therefore he did not qualify for the BALANCE out, however he can roll into the CORE trial.  Follow up in 6-8 weeks with Dr. Melynda Keller, FNP  10/26/20

## 2020-10-27 ENCOUNTER — Telehealth (HOSPITAL_COMMUNITY): Payer: Self-pay | Admitting: Pharmacy Technician

## 2020-10-27 NOTE — Telephone Encounter (Signed)
Advanced Heart Failure Patient Advocate Encounter   Received notification from [employer] that prior authorization for Delene Loll is required.   PA submitted on CoverMyMeds Key [id number] Status is pending   Will continue to follow.

## 2020-11-01 NOTE — Telephone Encounter (Signed)
Advanced Heart Failure Patient Advocate Encounter  Prior Authorization for Delene Loll has been approved.    PA# [id number] Effective dates: 10/31/20 through 11/01/23  Charlann Boxer, CPhT

## 2020-11-07 ENCOUNTER — Other Ambulatory Visit: Payer: Self-pay

## 2020-11-07 ENCOUNTER — Ambulatory Visit (HOSPITAL_COMMUNITY)
Admission: RE | Admit: 2020-11-07 | Discharge: 2020-11-07 | Disposition: A | Discharge status: Home / Self Care | Payer: Commercial Managed Care - PPO | Source: Ambulatory Visit | Attending: Internal Medicine | Admitting: Internal Medicine

## 2020-11-07 DIAGNOSIS — I5042 Chronic combined systolic (congestive) and diastolic (congestive) heart failure: Secondary | ICD-10-CM

## 2020-11-07 LAB — BASIC METABOLIC PANEL
Anion gap: 11 (ref 5–15)
BUN: 44 mg/dL — ABNORMAL HIGH (ref 6–20)
CO2: 26 mmol/L (ref 22–32)
Calcium: 10.8 mg/dL — ABNORMAL HIGH (ref 8.9–10.3)
Chloride: 95 mmol/L — ABNORMAL LOW (ref 98–111)
Creatinine, Ser: 1.42 mg/dL — ABNORMAL HIGH (ref 0.61–1.24)
GFR, Estimated: 59 mL/min — ABNORMAL LOW (ref >60)
Glucose, Bld: 361 mg/dL — ABNORMAL HIGH (ref 70–99)
Potassium: 5.1 mmol/L (ref 3.5–5.1)
Sodium: 132 mmol/L — ABNORMAL LOW (ref 135–145)

## 2020-11-08 LAB — LDL CHOLESTEROL, DIRECT: LDL Direct: 21 mg/dL (ref 0–99)

## 2020-11-08 LAB — LIPID PANEL
Chol/HDL Ratio: 22.9 ratio — ABNORMAL HIGH (ref 0.0–5.0)
Cholesterol, Total: 298 mg/dL — ABNORMAL HIGH (ref 100–199)
HDL: 13 mg/dL — ABNORMAL LOW (ref >39)
Triglycerides: 2063 mg/dL (ref 0–149)

## 2020-11-16 ENCOUNTER — Other Ambulatory Visit: Payer: Self-pay

## 2020-11-16 ENCOUNTER — Ambulatory Visit (HOSPITAL_BASED_OUTPATIENT_CLINIC_OR_DEPARTMENT_OTHER): Payer: Commercial Managed Care - PPO | Admitting: Internal Medicine

## 2020-11-16 ENCOUNTER — Telehealth: Payer: Self-pay | Admitting: Internal Medicine

## 2020-11-16 ENCOUNTER — Encounter (HOSPITAL_BASED_OUTPATIENT_CLINIC_OR_DEPARTMENT_OTHER): Payer: Self-pay | Admitting: Internal Medicine

## 2020-11-16 VITALS — BP 110/68 | HR 94 | Ht 70.0 in | Wt 232.8 lb

## 2020-11-16 DIAGNOSIS — Z951 Presence of aortocoronary bypass graft: Secondary | ICD-10-CM | POA: Diagnosis not present

## 2020-11-16 DIAGNOSIS — I2510 Atherosclerotic heart disease of native coronary artery without angina pectoris: Secondary | ICD-10-CM | POA: Diagnosis not present

## 2020-11-16 DIAGNOSIS — E119 Type 2 diabetes mellitus without complications: Secondary | ICD-10-CM

## 2020-11-16 DIAGNOSIS — Z794 Long term (current) use of insulin: Secondary | ICD-10-CM

## 2020-11-16 DIAGNOSIS — E783 Hyperchylomicronemia: Secondary | ICD-10-CM | POA: Diagnosis not present

## 2020-11-16 NOTE — Progress Notes (Signed)
LIPID CLINIC CONSULT NOTE  Chief Complaint:  No complaints  Primary Care Physician: Leeroy Cha, MD  HPI:  Juan [name] is a 55 y.o. male who is being seen today for the evaluation of high triglycerides at the request of Leeroy Cha,*.  Juan [name] is seen today for evaluation of elevated triglycerides.  He has been a patient of Dr. Lavetta Nielsen at [facility] for many years.  He was last seen there in 2018.  He has hyperchylomicronemia.  Triglycerides have been very high and most recently up to 4730.  He says at best his triglycerides have come down to 450, but this was after very strict low saturated fat and calorie diet.  He says he has not been able to maintain that.  He also has diabetes and his blood sugars have not been optimally controlled.  His most recent A1c in September 2019 was 9.6.  He sees [name].  He has been on fenofibrate 160 mg.  In addition he takes low-dose simvastatin 10 mg.  He has a history of steatohepatitis and elevated liver enzymes in the past however most recently his ALT was 62 in September.  He also has clinical coronary disease having been found to have multivessel coronary disease in 2014 and subsequently he underwent coronary artery bypass grafting and has had multivessel PCI.  He was previously considered for niacin therapy however never really took that because of not being able to achieve good glycemic control.  He is not previously been on any omega-3's and had been considered for clinical trials but was not enrolled.  06/02/2018  Juan [name] returns today for follow-up of elevated triglycerides.  Fortunately, he has had a significant increase in his triglycerides since we last saw him.  His most recent labs showed total cholesterol of 515 with triglycerides of 5975.  HDL was 3 and LDL was 6 indicating persistent familial chylomicronemia syndrome.  He does say that his diet has significantly altered recently due to increased demands at work as well  as increased demands on his wife who works in a school [employer].  Although schools are now close due to the COVID-19 virus, she is now working extended hours to produce foods for children who are not at school.  He does report compliance with his medications.  12/29/2018  Juan [name] is seen today for follow-up.  Overall he seems to be doing well without any new chest pain or worsening shortness of breath.  Unfortunate his triglycerides remain elevated.  They have come down some in the mid 3600s.  His hemoglobin A1c is improved as well in the low sevens.  Unfortunately, were not able to get his triglycerides to be normalized at this point.  He is on a strict diet to lower saturated fats.  He is on maximal therapy at this point with fibrate and Vascepa.  We will need to investigate a possible clinical trial options for him.  LDL has been as low as 6.  08/10/2019  Juan [name] returns for follow-up.  Unfortunately spring was hospitalized with cellulitis/sepsis.  He was on an insulin drip and other things at that time and this may have contributed to a very low triglyceride number although previously he has had significantly elevated triglycerides and his generally never seen numbers below 800.  He continues on maximal standard therapy.  I had discussed with him previously about a clinical trial option for him and now that trial is available.  He is a candidate for the  balance trial and agreeable to participate.  Finally, he is noted to have persistent lower extremity edema.  He has seen Coletta Memos, NP about this who recommended increasing his Lasix.  I would advise increasing it to twice daily for least a couple weeks and he should have close clinical follow-up.  He still has some redness although no warmth and I suspect venous congestion of the right lower extremity.  Several rounds of antibiotics have not improved it.  He did have a ruptured blister today which I dressed.  11/14/2019  Juan [name] is seen  today in follow-up.  He again recently had a wound to the right shin.  This is dressed today.  I looked at it and it seems to be slowly healing.  No evidence of any cellulitis I encouraged him to see the wound care clinic but he was going to try to see if he could doctor it home and follow-up with me if it was not improving.  We did screen him for the balance trial, but his FCS genetic screening was surprisingly negative.  Therefore he likely has severe multifactorial hypertriglyceridemia.  Most recently his triglycerides however have improved significantly down to 209.  He also had some recent chest discomfort.  He saw one of our NP's who had started isosorbide.  He has had some mild relief with this but occasionally gets some symptoms that persist.  05/13/2020  Juan [name] is seen today in follow-up. Unfortunately had a difficult year. He underwent repeat cardiac catheterization with a decline in LVEF showing some occluded grafts. He is now being followed by Dr. Haroldine Laws in the advanced heart failure clinic. He has been struggling with some lower extremity edema and shortness of breath. His triglycerides remain significantly elevated. Although they are improved by about 50% they were 2689 on February 21. He has not had any recent episodes of pancreatitis. Total cholesterol is 338, HDL is only 10 and LDL cannot be calculated. We evaluated him for the BALANCE trial however he tested genetically negative for FCS. Fortunately, he is a candidate to rollover into the CORE file which I anticipate to start within the next couple of weeks.  11/16/2020  Juan [name] returns today for follow-up.  He continues to struggle with dyslipidemia primarily elevated triglycerides.  Recent labs showed total cholesterol 298, triglycerides 2063, HDL 13.  Direct LDL up somewhat to 21 however he has not been on a statin because of very low cholesterol in the past.  We had evaluated him for the balance trial however his testing for FCS  was negative.  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  He is on fenofibrate and Vascepa.  His triglycerides were as high as 5000 in the past.  He is a good candidate for the core trial as mentioned above.  I spoke with the new nurse that we will be handling enrollment for this and she will plan on reaching out to him to screen within the next couple of weeks.  We also discussed genetic testing for dyslipidemia today and he was interested in that.  PMHx:  Past Medical History:  Diagnosis Date   Abnormal cardiovascular stress test 02/06/2013   Anemia    occasional - no problems currently(10/02/2011)   Bilateral renal cysts 06/16/2011   states no known problems   Blood transfusion without reported diagnosis    CAD (coronary artery disease), LAD 90%, 1st diag 95%, LCX 70% wth AV  groove 90%, RCA 40-50% mid and 80% long distal stenosis 02/03/13 02/04/2013   Chest pain    positive Myoview stress test   CHF (congestive heart failure) (HCC)    Coronary artery calcification seen on CAT scan    Diabetes mellitus    IDDM   Fatty liver disease, nonalcoholic 123456   Hyperlipidemia    Hypertension    states no dx. of HTN, takes med. to protect kidneys due to DM   Lateral meniscus tear 09/2011   left   Loose body in knee 09/2011   loose bodies left knee   Non Hodgkin's lymphoma ([place]) 1991   Pancreatitis    occasional - last episode 06/2011   S/P CABG x 4, 02/05/13 LIMA-LAD; LT. RADIAL-OM;VG-DIAG; VG-PDA 02/06/2013   10/18 3/4 patent grafts (occluded SVG-->Diag), PCI/DESx 3 SVG-->RCA, normal EF   Splenomegaly, congestive, chronic    Stuffy and runny nose 10/02/2011   yellow drainage from nose    Past Surgical History:  Procedure Laterality Date   ABDOMINAL AORTIC ANEURYSM REPAIR     ANTERIOR CERVICAL DECOMP/DISCECTOMY FUSION N/A 03/26/2018   Procedure: ANTERIOR CERVICAL DECOMPRESSION FUSION CERVICAL 5-6 WITH INSTRUMENTATION AND  ALLOGRAFT;  Surgeon: Phylliss Bob, MD;  Location: [place];  Service: Orthopedics;  Laterality: N/A;   CORONARY ARTERY BYPASS GRAFT N/A 02/05/2013   Procedure: CORONARY ARTERY BYPASS GRAFTING (CABG);  Surgeon: Ivin Poot, MD;  Location: [place];  Service: Open Heart Surgery;  Laterality: N/A;  Coronary artery bypass graft on pump times four using left internal mammary artery and right greater saphenous vein via endovein harvest and left radial artery harvest.    CORONARY STENT INTERVENTION  12/17/2016    PCI and drug-eluting stenting of the mid and distal RCA SVG    CORONARY STENT INTERVENTION N/A 12/17/2016   Procedure: CORONARY STENT INTERVENTION;  Surgeon: Lorretta Harp, MD;  Location: [place] [facility];  Service: Cardiovascular;  Laterality: N/A;   ELBOW SURGERY     HERNIA REPAIR     inguinal    herniated disc     INTRAOPERATIVE TRANSESOPHAGEAL ECHOCARDIOGRAM N/A 02/05/2013   Procedure: INTRAOPERATIVE TRANSESOPHAGEAL ECHOCARDIOGRAM;  Surgeon: Ivin Poot, MD;  Location: [place];  Service: Open Heart Surgery;  Laterality: N/A;   KNEE ARTHROSCOPY  10/09/2011   Procedure: ARTHROSCOPY KNEE;  Surgeon: Nita Sells, MD;  Location: [place];  Service: Orthopedics;  Laterality: Left;   LEFT HEART CATH AND CORS/GRAFTS ANGIOGRAPHY N/A 12/17/2016   Procedure: LEFT HEART CATH AND CORS/GRAFTS ANGIOGRAPHY;  Surgeon: Lorretta Harp, MD;  Location: [place] [facility];  Service: Cardiovascular;  Laterality: N/A;   LEFT HEART CATHETERIZATION WITH CORONARY ANGIOGRAM N/A 02/03/2013   Procedure: LEFT HEART CATHETERIZATION WITH CORONARY ANGIOGRAM;  Surgeon: Lorretta Harp, MD;  Location: [facility] C[facility];  Service: Cardiovascular;  Laterality: N/A;   NASAL SEPTUM SURGERY     RADIAL ARTERY HARVEST Left 02/05/2013   Procedure: RADIAL ARTERY HARVEST;  Surgeon: Ivin Poot, MD;  Location: [place];  Service: Vascular;  Laterality: Left;   RIGHT/LEFT HEART CATH AND CORONARY/GRAFT  ANGIOGRAPHY N/A 02/15/2020   Procedure: RIGHT/LEFT HEART CATH AND CORONARY/GRAFT ANGIOGRAPHY;  Surgeon: Lorretta Harp, MD;  Location: [place] [facility];  Service: Cardiovascular;  Laterality: N/A;   TEE WITHOUT CARDIOVERSION N/A 02/18/2020   Procedure: TRANSESOPHAGEAL ECHOCARDIOGRAM (TEE);  Surgeon: Jolaine Artist, MD;  [facility] END[facility];  Service: Cardiovascular;  Laterality: N/A;   TIBIA BONE BIOPSY  x 3  left   TONSILLECTOMY      FAMHx:  Family History  Adopted: Yes    SOCHx:   reports that he has never smoked. He has never used smokeless tobacco. He reports that he does not drink alcohol and does not use drugs.  ALLERGIES:  Allergies  Allergen Reactions   Reglan [Metoclopramide] Other (See Comments)    Only can tolerate in low doses, in higher doses it has the opposite effect   Sglt2 Inhibitors     Cellulitis with Jardiance and Farxiga    ROS: Pertinent items noted in HPI and remainder of comprehensive ROS otherwise negative.  HOME MEDS: Current Outpatient Medications on File Prior to Visit  Medication Sig Dispense Refill   aspirin 81 MG chewable tablet Chew 1 tablet (81 mg total) by mouth daily.     clopidogrel (PLAVIX) 75 MG tablet TAKE 1 TABLET BY MOUTH EVERY DAY WITH BREAKFAST 90 tablet 3   Continuous Blood Gluc Receiver (DEXCOM G6 RECEIVER) DEVI See admin instructions.     fenofibrate 160 MG tablet Take 160 mg by mouth daily.      Glucose Blood (BAYER CONTOUR NEXT TEST VI) glucose testing     insulin regular human CONCENTRATED (HUMULIN R) 500 UNIT/ML injection Inject into the skin continuous. Using insulin pump     isosorbide mononitrate (IMDUR) 30 MG 24 hr tablet Take 1 tablet (30 mg total) by mouth daily. 90 tablet 3   metFORMIN (GLUCOPHAGE) 1000 MG tablet Take 1,000 mg by mouth 2 (two) times daily with a meal.     metoprolol succinate (TOPROL-XL) 25 MG 24 hr tablet TAKE 3 TABLETS BY MOUTH TWICE A DAY WITH OR IMMEDIATELY FOLLOWING A MEAL 540 tablet 3    nitroGLYCERIN (NITROSTAT) 0.4 MG SL tablet Place 1 tablet (0.4 mg total) under the tongue every 5 (five) minutes as needed. 25 tablet 2   Propylene Glycol (SYSTANE BALANCE) 0.6 % SOLN Place 1 drop into both eyes daily as needed (dry eyes).     sacubitril-valsartan (ENTRESTO) 49-51 MG Take 1 tablet by mouth 2 (two) times daily. 60 tablet 6   spironolactone (ALDACTONE) 25 MG tablet Take 1 tablet (25 mg total) by mouth daily. 90 tablet 3   torsemide (DEMADEX) 20 MG tablet TAKE 2 TABLETS BY MOUTH EVERY DAY 180 tablet 3   TRESIBA FLEXTOUCH 200 UNIT/ML FlexTouch Pen Inject 26 Units into the skin daily.     VASCEPA 1 g capsule TAKE 2 CAPSULES BY MOUTH TWICE A DAY 360 capsule 4   No current facility-administered medications on file prior to visit.    LABS/IMAGING: No results found for this or any previous visit (from the past 48 hour(s)). No results found.  LIPID PANEL:    Component Value Date/Time   CHOL 298 (H) 11/07/2020 0923   TRIG 2,063 (HH) 11/07/2020 0923   HDL 13 (L) 11/07/2020 0923   CHOLHDL 22.9 (H) 11/07/2020 0923   CHOLHDL 14.2 06/17/2011 0400   VLDL UNABLE TO CALCULATE IF TRIGLYCERIDE OVER 400 mg/dL 06/17/2011 0400   LDLCALC Comment (A) 11/07/2020 0923   LDLDIRECT 21 11/07/2020 0924    WEIGHTS: Wt Readings from Last 3 Encounters:  11/16/20 232 lb 12.8 oz (105.6 kg)  10/26/20 239 lb 6.4 oz (108.6 kg)  07/23/20 234 lb 9.1 oz (106.4 kg)    VITALS: BP 110/68   Pulse 94   Ht '5\' 10"'$  (1.778 m)   Wt 232 lb 12.8 oz (105.6 kg)   SpO2 95%   BMI 33.40  kg/m   EXAM: Deferred  EKG: Deferred  ASSESSMENT: Multifactorial severe hyperchyomicronemia -FCS genetically negative ASCVD with prior four-vessel CABG and PCI Insulin-dependent diabetes Hypertension  PLAN: 1.   Juan [name] continues to have persistently abnormal triglycerides.  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  A repeat genetic  test will be ordered today.  This could be helpful for further treating him.  We will plan to screen him in the next 2 weeks for the core trial hopefully he will be randomized to therapy.  He will continue on his current treatments.  Follow-up with me in 6 months but likely not repeat lipids as I will be blinded for those results in the trial.  Pixie Casino, MD, [facility], [place] Director of the Advanced Lipid Disorders &  Cardiovascular Risk Reduction Clinic Diplomate of the American Board of Clinical Lipidology Attending Cardiologist  Direct Dial: [phone]  Fax: [phone]  Website:  [email or web address] 11/16/2020, 8:59 AM

## 2020-11-16 NOTE — Patient Instructions (Addendum)
Medication Instructions:  You should be contacted about screening for a clinical trial - dates 9/19, 9/20  *If you need a refill on your cardiac medications before your next appointment, please call your pharmacy*   Follow-Up: At [facility], you and your health needs are our priority.  As part of our continuing mission to provide you with exceptional heart care, we have created designated Provider Care Teams.  These Care Teams include your primary Cardiologist (physician) and Advanced Practice Providers (APPs -  Physician Assistants and Nurse Practitioners) who all work together to provide you with the care you need, when you need it.  We recommend signing up for the patient portal called "MyChart".  Sign up information is provided on this After Visit Summary.  MyChart is used to connect with patients for Virtual Visits (Telemedicine).  Patients are able to view lab/test results, encounter notes, upcoming appointments, etc.  Non-urgent messages can be sent to your provider as well.   To learn more about what you can do with MyChart, go to [email or web address].    Your next appointment:   6 month(s) - lipid clinic  The format for your next appointment:   In Person  Provider:   K. Mali Hilty, MD   Other Instructions

## 2020-11-16 NOTE — Telephone Encounter (Signed)
Patient was seen by MD today Genetic test for familial hypertriglyceridemia panel ordered Buccal swab performed Patient aware results should be available in about 3 weeks

## 2020-11-17 NOTE — Progress Notes (Signed)
Pt is scheduled for 12/05/20 to screen for Core trial. Pt is aware of date and time. Information has been emailed to the pt.

## 2020-12-01 ENCOUNTER — Other Ambulatory Visit: Payer: Self-pay | Admitting: Internal Medicine

## 2020-12-05 ENCOUNTER — Other Ambulatory Visit: Payer: Self-pay

## 2020-12-05 VITALS — BP 110/67 | HR 90 | Temp 97.7°F | Resp 18 | Ht 70.0 in | Wt 238.6 lb

## 2020-12-05 DIAGNOSIS — Z006 Encounter for examination for normal comparison and control in clinical research program: Secondary | ICD-10-CM

## 2020-12-05 NOTE — Research (Signed)
     Screening Run-In Clinic Visit    Date of Visit: ____09/19/2022________    Subject #: __[id number]      During this visit the following activities were completed:  $RemoveBef'[x]'tCzXALDqUk$ Reading, Signing and Understanding the informed Consent   '[x]'$ Review Inclusion/Exclusion Criteria  $RemoveB'[x]'CEDheHER$ Vital Signs, Height, & Weight:  - Blood pressure: 110/67(Subject sat supine for at least 5 minutes before blood pressure was performed) - Heart rate:90 - Temperature:97.7 - Respiratory Rate:18 - Oxygen Saturation:95% - Weight:238.6lb - Height:5'10"  $RemoveBefor'[x]'zOCMgEKZihEb$ Physical Exam done by PI or Sub-I  $Remo'[x]'wMDRz$ Review Subjects Medical History & Concomitant Medications  $RemoveBefo'[x]'eOphBZttLOO$ Review Any Adverse Events/ Serious Adverse Events  $Remov'[x]'ZQyrsO$ Review of any ER Visits, Hospitalizations and Inpatient Days  $Rem'[x]'UqQb$ 12-Lead ECG (Subject sat supine for at least 5 minutes before this was performed)  *All ECG's completed will be available in subjects binder   '[x]'$  Subject fasting   '[x]'$  Blood and Urine specimens collected per protocol   '[]'$ Genetic Testing Completed (Only for patients with suspected FCS) Patient previously tested negative.  $RemoveBe'[]'HLHCCEtZY$ Extended Urinalysis/Pregnancy Test (if woman of childbearing age)  $Rem'[x]'LiLM$ Diet/Lifestyle/Alcohol Counseling with Subject  $Remove'[x]'Embnjdj$ FCS Symptoms 2 Week Recall  $Remov'[x]'iTkhbn$ Education/teaching subject on importance of completing the daily diary   '[x]'$ Education on the importance of complying with contraception precautions during study with subject agreement     Subject came into the research clinic today (05 December 2020) for the Screening Run-In Visit for the CORE research study.  Subject signed the informed consent under protocol amendment 2, Version 08 Jun 2020, before any assessments were completed.     Subject Name: Juan [name]   Subject met inclusion and exclusion criteria.  The informed consent form, study requirements and expectations were reviewed with the subject and questions and concerns were addressed prior to the  signing of the consent form.  The subject verbalized understanding of the trial requirements.  The subject agreed to participate in the CORE trial and signed the informed consent at 8:20am on 05 December 2020.  The informed consent was obtained prior to performance of any protocol-specific procedures for the subject.  A copy of the signed informed consent was given to the subject and a copy was placed in the subject's medical record.   Patients next visit is scheduled for 12/19/20 at 8:00am.   Burnadette Peter LPN

## 2020-12-05 NOTE — Progress Notes (Signed)
As the principal investigator of the CORE trial at [facility], I have reviewed the patients history, indications for the trial, labwork and other factors and find that they meet criteria for enrollment in the study. The patient was provided, reviewed and comprehended written consent to participate in the study and is agreeable to proceed.  General appearance: alert, no distress, and moderately obese Neck: no carotid bruit, no JVD, and thyroid not enlarged, symmetric, no tenderness/mass/nodules Lungs: clear to auscultation bilaterally Heart: regular rate and rhythm, S1, S2 normal, no murmur, click, rub or gallop Abdomen: obese Extremities: edema 2-3+ bilateral LE edema Pulses: 2+ and symmetric Skin: pale, warm Neurologic: Alert and oriented X 3, normal strength and tone. Normal symmetric reflexes. Normal coordination and gait Psych: Pleasant   Pixie Casino, MD, FACC, [place] Director of the Advanced Lipid Disorders &  Cardiovascular Risk Reduction Clinic Diplomate of the American Board of Clinical Lipidology Attending Cardiologist  Direct Dial: [phone]  Fax: [phone]  Website:  [email or web address]

## 2020-12-12 ENCOUNTER — Encounter: Payer: Self-pay | Admitting: Internal Medicine

## 2020-12-13 NOTE — Progress Notes (Addendum)
CORE Abnormal Lab report September 19,2022  Chemistry: BUN                                  29 mmol/L            [] Clinically Significant  [x] Not Clinically Significant  Phosphorus                       2.2 mg/dL            [] Clinically Significant  [x] Not Clinically Significant Glucose                           256 mg/dL             [] Clinically Significant  [x] Not Clinically Significant Insulin                              377.0 IU                 [] Clinically Significant  [x] Not Clinically Significant ALT                                  73 U/L                    [] Clinically Significant  [x] Not Clinically Significant Gamma Glutamyl Transfers (GGT) 190 U/L  [] Clinically Significant  [x] Not Clinically Significant Hemoglobin A1C              8.8%                      [] Clinically Significant  [x] Not Clinically Significant   Hematology: Hemoglobin                       12.5 g/dL           [] Clinically Significant  [x] Not Clinically Significant MCV                                   78.6  fL             [] Clinically Significant  [x] Not Clinically Significant Hematocrit                           36%                 [] Clinically Significant  [x] Not Clinically Significant Platelet                                110                   [] Clinically Significant  [x] Not Clinically Significant RDW                                    17.4%              [] Clinically Significant  [x] Not Clinically Significant  Urinalysis: Glucose  500 mg/dL          [] Clinically Significant  [x] Not Clinically Significant Protein                                   70 mg/dl           [] Clinically Significant  [x] Not Clinically Significant Mucus                                    1+                     [] Clinically Significant  [x] Not Clinically Significant  Urine Chemistry: Protein Creatinine Ratio         4444 mg/g          [x] Clinically Significant  [] Not Clinically  Significant Urine Creatinine                            17.10 mg/dL     [] Clinically Significant  [x] Not Clinically Significant Urine protein                                 76 mg/dl          [] Clinically Significant  [x] Not Clinically Significant  Lipids Total Cholesterol                       332 mg/dl           [] Clinically Significant  [x] Not Clinically Significant Triglyceride                              2811 mg/dl           [] Clinically Significant  [x] Not Clinically Significant HDL- cholesterol (ppt)               12 mg/dl              [] Clinically Significant  [x] Not Clinically Significant Apolipoprotein Clll                     53.20 mg/dl         [] Clinically Significant  [x] Not Clinically Significant Non-HDL Cholesterol (calc)       320 mg/dl           [] Clinically Significant  [x] Not Clinically Significant Apolipoprotein E                         36.9mg /dl           [] Clinically Significant  [x] Not Clinically Significant    Any further action needed to be taken per the PI?Please note Protein Creatinine Ratio is elevated to 4,444 mg/g.  This result will meet exclusion criteria and pt is a screen fail.   Please notify the monitor - the Urine protein level is 76, the Urine Creatinine is 17.1 - therefore, the urine protein / urine creatinine ratio is 4.44 (not 4,444) - the number is incorrect. He should meet criteria once this is corrected.  Pixie Casino, MD, [facility], [place] Director of the Advanced Lipid Disorders &  Cardiovascular Risk Reduction  Clinic Diplomate of the American Board of Clinical Lipidology Attending Cardiologist  Direct Dial: [phone]  Fax: [phone]  Website:  [email or web address]

## 2020-12-19 ENCOUNTER — Other Ambulatory Visit: Payer: Self-pay

## 2020-12-19 VITALS — BP 129/68 | HR 94 | Temp 98.4°F | Resp 18 | Ht 70.0 in | Wt 240.0 lb

## 2020-12-19 DIAGNOSIS — Z006 Encounter for examination for normal comparison and control in clinical research program: Secondary | ICD-10-CM

## 2020-12-19 NOTE — Progress Notes (Signed)
Monitor is aware and they want [place] to redraw labs. He said the next visit labs should be what they need . Pt came in this morning for a visit and has had labs drawn.

## 2020-12-19 NOTE — Research (Addendum)
     Screening Qualification Visit   Subject Number: __[id number]                     Date:___10/05/2020     [x] Inclusion/Exclusion Criteria (please see lab results)   [] Pregnancy Test (if applicable)  [x] Collection of Hematology and Lipid Panel  [x] FCS Symptoms 7 Day Recall  [x] Assessment of ER Visits, Hospitalization and Inpatient Days  [x] Adverse Events and Concomitant Medications   Subject came into the research clinic today for the Screening Qualification Visit for the CORE research trial. Subject completed their [facility] Symptoms 7 Day Recall Questions and labs were obtained. Subject has no new AE's or SAE's to report at this time. All concomitant medications have been reviewed and updated.  Please see last lab documentation. Subject is aware that todays labs will be reviewed to see if he can continue in the study.  I will call him for additional appointment after all lab results have been reviewed.    As the principal investigator of the CORE trial at [facility], I have reviewed the patients history, indications for the trial, labwork and other factors and find that they meet criteria for enrollment in the study. The patient was provided, reviewed and comprehended written consent to participate in the study and is agreeable to proceed.  General appearance: alert and no distress Neck: no carotid bruit, no JVD, and thyroid not enlarged, symmetric, no tenderness/mass/nodules Lungs: clear to auscultation bilaterally Heart: regular rate and rhythm, S1, S2 normal, no murmur, click, rub or gallop Abdomen: soft, non-tender; bowel sounds normal; no masses,  no organomegaly and obese Extremities: edema 1-2+ edema Pulses: 2+ and symmetric Skin: Skin color, texture, turgor normal. No rashes or lesions Neurologic: Grossly normal Psych: Pleasant   Pixie Casino, MD, FACC, [place] Director of the Advanced Lipid Disorders &  Cardiovascular Risk Reduction  Clinic Diplomate of the American Board of Clinical Lipidology Attending Cardiologist  Direct Dial: [phone]  Fax: [phone]  Website:  [email or web address]

## 2020-12-23 NOTE — Progress Notes (Addendum)
       CORE Abnormal Lab report December 19, 2020  Lipids: Total cholesterol       297 mg/dL             [] Clinically Significant  [x] Not Clinically Significant  Triglycerides             2313 mg/dL           [] Clinically Significant  [x] Not Clinically Significant HDL Cholesterol (ppt)    12 mg/dL          [] Clinically Significant  [x] Not Clinically Significant Apolipoprotein CIII      50.91  mg/dL       [] Clinically Significant  [x] Not Clinically Significant Non-HDL Cholesterol (calc)  285 mg/dL [] Clinically Significant  [x] Not Clinically Significant Hematology: Hemoglobin   12.5 g/dL           [] Clinically Significant  [x] Not Clinically Significant Hematocrit      37 %                 [] Clinically Significant  [x] Not Clinically Significant MCV              80.0 fl                 [] Clinically Significant  [x] Not Clinically Significant Platelet           107                   [] Clinically Significant  [x] Not Clinically Significant RDW               17.5 %             [] Clinically Significant  [x] Not Clinically Significant  Urinalysis: Urine Chemistry:  Any further action needed to be taken per the PI?   Repeat urinalysis is pending to evaluate urine protein/creatinine ratio.  Pixie Casino, MD, [facility], [place] Director of the Advanced Lipid Disorders &  Cardiovascular Risk Reduction Clinic Diplomate of the American Board of Clinical Lipidology Attending Cardiologist  Direct Dial: [phone]  Fax: [phone]  Website:  [email or web address]

## 2020-12-29 ENCOUNTER — Other Ambulatory Visit: Payer: Self-pay | Admitting: Internal Medicine

## 2020-12-29 DIAGNOSIS — Z006 Encounter for examination for normal comparison and control in clinical research program: Secondary | ICD-10-CM

## 2020-12-29 NOTE — Progress Notes (Signed)
Pt needs to repeat UA and creatinine ratio due to elevated results previously with study lab. Tried to call pt, LMOM for pt to call back when he can come by and give a urine sample. Sample will need to be taken to local lab.  Orders have been completed for this.

## 2020-12-30 DIAGNOSIS — E1165 Type 2 diabetes mellitus with hyperglycemia: Secondary | ICD-10-CM | POA: Insufficient documentation

## 2020-12-30 DIAGNOSIS — I5020 Unspecified systolic (congestive) heart failure: Secondary | ICD-10-CM | POA: Insufficient documentation

## 2020-12-30 DIAGNOSIS — R809 Proteinuria, unspecified: Secondary | ICD-10-CM | POA: Insufficient documentation

## 2020-12-30 DIAGNOSIS — N189 Chronic kidney disease, unspecified: Secondary | ICD-10-CM | POA: Insufficient documentation

## 2020-12-30 DIAGNOSIS — Z9641 Presence of insulin pump (external) (internal): Secondary | ICD-10-CM | POA: Insufficient documentation

## 2020-12-30 DIAGNOSIS — H35033 Hypertensive retinopathy, bilateral: Secondary | ICD-10-CM | POA: Insufficient documentation

## 2020-12-30 NOTE — Addendum Note (Signed)
Addended by: Claudina Lick on: 12/30/2020 07:32 AM   Modules accepted: Orders

## 2021-01-02 ENCOUNTER — Telehealth: Payer: Self-pay | Admitting: Internal Medicine

## 2021-01-02 NOTE — Telephone Encounter (Signed)
-----   Message from Fidel Levy, RN sent at 12/14/2020  8:30 AM EDT ----- Regarding: FW: genetic test results Are you able to call this patient? 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  He actually has 2 mutations for this.  Dr. Debara Pickett  ----- Message ----- From: Fidel Levy, RN Sent: 12/07/2020   7:37 AM EDT To: Pixie Casino, MD Subject: genetic test results                           Hi - his genetic test results are back   He does not have MyChart  Looks like he has been screened for CORE

## 2021-01-02 NOTE — Telephone Encounter (Signed)
   Called Juan [name] with the results of his genetic testing.  I described the mutations and expected consequences of those mutations.  I think this does explain his triglyceride disorder.  I answered all the questions to the best of my ability.  I expect he could derive benefit from the research trial he is participating in.  Genetic test results should be scanned into my chart under media.  Pixie Casino, MD, [facility], [place] Director of the Advanced Lipid Disorders &  Cardiovascular Risk Reduction Clinic Diplomate of the American Board of Clinical Lipidology Attending Cardiologist  Direct Dial: [phone]  Fax: [phone]  Website:  [email or web address]

## 2021-01-03 NOTE — Progress Notes (Signed)
Spoke with the pt, he stated he would come by tomorrow afternoon and leave a urine sample.

## 2021-01-05 ENCOUNTER — Other Ambulatory Visit: Payer: Self-pay

## 2021-01-05 ENCOUNTER — Encounter (HOSPITAL_COMMUNITY): Payer: Self-pay | Admitting: Internal Medicine

## 2021-01-05 ENCOUNTER — Ambulatory Visit (HOSPITAL_COMMUNITY)
Admission: RE | Admit: 2021-01-05 | Discharge: 2021-01-05 | Disposition: A | Discharge status: Home / Self Care | Payer: Commercial Managed Care - PPO | Source: Ambulatory Visit | Attending: Internal Medicine | Admitting: Internal Medicine

## 2021-01-05 VITALS — BP 140/80 | HR 82 | Wt 240.8 lb

## 2021-01-05 DIAGNOSIS — Z7982 Long term (current) use of aspirin: Secondary | ICD-10-CM | POA: Insufficient documentation

## 2021-01-05 DIAGNOSIS — Z9641 Presence of insulin pump (external) (internal): Secondary | ICD-10-CM | POA: Insufficient documentation

## 2021-01-05 DIAGNOSIS — E783 Hyperchylomicronemia: Secondary | ICD-10-CM | POA: Diagnosis not present

## 2021-01-05 DIAGNOSIS — E119 Type 2 diabetes mellitus without complications: Secondary | ICD-10-CM | POA: Diagnosis not present

## 2021-01-05 DIAGNOSIS — E781 Pure hyperglyceridemia: Secondary | ICD-10-CM | POA: Insufficient documentation

## 2021-01-05 DIAGNOSIS — Z7902 Long term (current) use of antithrombotics/antiplatelets: Secondary | ICD-10-CM | POA: Insufficient documentation

## 2021-01-05 DIAGNOSIS — Z951 Presence of aortocoronary bypass graft: Secondary | ICD-10-CM | POA: Diagnosis not present

## 2021-01-05 DIAGNOSIS — I5022 Chronic systolic (congestive) heart failure: Secondary | ICD-10-CM | POA: Diagnosis not present

## 2021-01-05 DIAGNOSIS — Z955 Presence of coronary angioplasty implant and graft: Secondary | ICD-10-CM | POA: Diagnosis not present

## 2021-01-05 DIAGNOSIS — Z79899 Other long term (current) drug therapy: Secondary | ICD-10-CM | POA: Diagnosis not present

## 2021-01-05 DIAGNOSIS — I340 Nonrheumatic mitral (valve) insufficiency: Secondary | ICD-10-CM | POA: Diagnosis not present

## 2021-01-05 DIAGNOSIS — Z794 Long term (current) use of insulin: Secondary | ICD-10-CM | POA: Insufficient documentation

## 2021-01-05 DIAGNOSIS — I2510 Atherosclerotic heart disease of native coronary artery without angina pectoris: Secondary | ICD-10-CM | POA: Diagnosis not present

## 2021-01-05 DIAGNOSIS — Z006 Encounter for examination for normal comparison and control in clinical research program: Secondary | ICD-10-CM

## 2021-01-05 NOTE — Progress Notes (Signed)
ADVANCED HF CLINIC NOTE  PCP: Leeroy Cha, MD Primary Cardiologist: Gwenlyn Found HF: DB  HPI:  Juan [name] is 55 y/o male w/ h/o systolic HF, EF 26%, 3VCAD s/p CABG in 2014 followed by stenting to SVG-RCA in 2018, DM2, severe hypertriglyceridemia w/ subsequent episodes of pancreatitis.  Seen by Dr. Gwenlyn Found 11/21 for progressive dyspnea and wt gain which was being managed by increasing home diuretics w/o much improvement in volume or symptoms. Echo repeated showing stable LVEF at 45%. RV systolic function low normal. Mild-mod MR also noted. Given persistent symptoms, he had LHC that showed progression of his native and graft disease.  All vein grafts are functionally occluded with minimal left to right collaterals.  His LIMA is patent although his LAD is diffusely diseased.  RHC showed mPCWP mildly elevated at 19. LVEDP 23. CO by Fick 3.25, CI 1.48, by thermo calculation CO 7, CI 3.2. [name] was markedly edematous and admitted for management of acute on chronic systolic heart failure with IV diuretics. In addition, he had TTE which showed only mild-mod MR.  Diuresed with IV lasix and transitioned to [facility].  Echo 4/22 EF 50-55% mild MR   Here for f/u. [place] working at  [employer]. Has been following with Dr. Debara Pickett and found to have severe genetic mutations that predispose to hyperTGs. Overall doing ok.Says fluid is coming back on him. Unable to wear compression socks. Denies CP, orthopnea or PND. Taking torsemide 40 mg daily. Will take extra at night if weight up 4 pounds at night. Drinking a lot of fluid because sugars have been so high. A1c ~8.2   ROS: All systems negative except as listed in HPI, PMH and Problem List.  SH:  Social History   Socioeconomic History   Marital status: Married    Spouse name: Juan [name]   Number of children: 1   Years of education: 16   Highest education level: Not on file  Occupational History   Occupation: Engineer, drilling      Employer: [employer]  Tobacco Use   Smoking status: Never   Smokeless tobacco: Never  Vaping Use   Vaping Use: Never used  Substance and Sexual Activity   Alcohol use: No   Drug use: No   Sexual activity: Yes  Other Topics Concern   Not on file  Social History Narrative   Adopted, so no pertinent FH.  Married.  Lives with wife in [place].  Independent of ADLs and ambulation.   Social Determinants of Health   Financial Resource Strain: Not on file  Food Insecurity: Not on file  Transportation Needs: Not on file  Physical Activity: Not on file  Stress: Not on file  Social Connections: Not on file  Intimate Partner Violence: Not on file    FH:  Family History  Adopted: Yes    Past Medical History:  Diagnosis Date   Abnormal cardiovascular stress test 02/06/2013   Anemia    occasional - no problems currently(10/02/2011)   Bilateral renal cysts 06/16/2011   states no known problems   Blood transfusion without reported diagnosis    CAD (coronary artery disease), LAD 90%, 1st diag 95%, LCX 70% wth AV groove 90%, RCA 40-50% mid and 80% long distal stenosis 02/03/13 02/04/2013   Chest pain    positive Myoview stress test   CHF (congestive heart failure) (HCC)    Coronary artery calcification seen on CAT scan    Diabetes mellitus    IDDM   Fatty liver  disease, nonalcoholic 8/84/1660   Hyperlipidemia    Hypertension    states no dx. of HTN, takes med. to protect kidneys due to DM   Lateral meniscus tear 09/2011   left   Loose body in knee 09/2011   loose bodies left knee   Non Hodgkin's lymphoma ([place]) 1991   Pancreatitis    occasional - last episode 06/2011   S/P CABG x 4, 02/05/13 LIMA-LAD; LT. RADIAL-OM;VG-DIAG; VG-PDA 02/06/2013   10/18 3/4 patent grafts (occluded SVG-->Diag), PCI/DESx 3 SVG-->RCA, normal EF   Splenomegaly, congestive, chronic    Stuffy and runny nose 10/02/2011   yellow drainage from nose    Current Outpatient Medications  Medication Sig  Dispense Refill   aspirin 81 MG chewable tablet Chew 1 tablet (81 mg total) by mouth daily.     clopidogrel (PLAVIX) 75 MG tablet TAKE 1 TABLET BY MOUTH EVERY DAY WITH BREAKFAST 90 tablet 3   Continuous Blood Gluc Receiver (DEXCOM G6 RECEIVER) DEVI See admin instructions.     fenofibrate 160 MG tablet Take 160 mg by mouth daily.      Glucose Blood (BAYER CONTOUR NEXT TEST VI) glucose testing     insulin regular human CONCENTRATED (HUMULIN R) 500 UNIT/ML injection Inject into the skin continuous. Using insulin pump     isosorbide mononitrate (IMDUR) 30 MG 24 hr tablet TAKE 1 TABLET BY MOUTH EVERY DAY 90 tablet 3   metFORMIN (GLUCOPHAGE) 1000 MG tablet Take 1,000 mg by mouth 2 (two) times daily with a meal.     metoprolol succinate (TOPROL-XL) 25 MG 24 hr tablet TAKE 3 TABLETS BY MOUTH TWICE A DAY WITH OR IMMEDIATELY FOLLOWING A MEAL 540 tablet 3   nitroGLYCERIN (NITROSTAT) 0.4 MG SL tablet Place 1 tablet (0.4 mg total) under the tongue every 5 (five) minutes as needed. 25 tablet 2   Propylene Glycol (SYSTANE BALANCE) 0.6 % SOLN Place 1 drop into both eyes daily as needed (dry eyes).     sacubitril-valsartan (ENTRESTO) 49-51 MG Take 1 tablet by mouth 2 (two) times daily. 60 tablet 6   spironolactone (ALDACTONE) 25 MG tablet Take 1 tablet (25 mg total) by mouth daily. 90 tablet 3   torsemide (DEMADEX) 20 MG tablet TAKE 2 TABLETS BY MOUTH EVERY DAY 180 tablet 3   TRESIBA FLEXTOUCH 200 UNIT/ML FlexTouch Pen Inject 26 Units into the skin daily.     VASCEPA 1 g capsule TAKE 2 CAPSULES BY MOUTH TWICE A DAY 360 capsule 4   No current facility-administered medications for this encounter.    Vitals:   01/05/21 1440  BP: 140/80  Pulse: 82  SpO2: 97%  Weight: 109.2 kg (240 lb 12.8 oz)   Filed Weights   01/05/21 1440  Weight: 109.2 kg (240 lb 12.8 oz)     PHYSICAL EXAM:  General:  Well appearing. No resp difficulty HEENT: normal Neck: supple. no JVD. Carotids 2+ bilat; no bruits. No  lymphadenopathy or thryomegaly appreciated. Cor: PMI nondisplaced. Regular rate & rhythm. No rubs, gallops or murmurs. Lungs: clear Abdomen: obese soft, nontender, nondistended. No hepatosplenomegaly. No bruits or masses. Good bowel sounds. Extremities: no cyanosis, clubbing, rash, 2+ edema R>L Neuro: alert & orientedx3, cranial nerves grossly intact. moves all 4 extremities w/o difficulty. Affect pleasant   ASSESSMENT & PLAN:  1. Chronic Systolic Heart Failure - Echo 2017 showed normal LVEF 60-65% - Echo 4/21, EF mildly reduced 45%. NST showed scar but no ischemia - Echo 11/21, EF 63%, RV systolic function low  normal  - Echo 4/22 50-55%  - RHC 11/21 showed mildly elevated filling pressures, PCWP 19. LVEDP 23. CO by Fick 3.25, CI 1.48, by thermo calculation CO 7, CI 3.2.  - Stable NYHA II He is volume overloaded.  - Continue spiro  25  - Continue torsemide 40 daily -> will increase to 40 bid for 3 days. If weight not down about 8 pounds by next week he will contact us and we will use metoalzone x 1.  - Continue Entresto 24-26 mg bid try to increase next visit - Continue Toprol 75 bid - Ideally would like to add SGLT2i but he is functionally DM1.5 so I have asked him to discuss with Dr. Debbora Presto if this is ok. She said it was ok to try but stopped because he got cellulitis - I think Cardiomems will help better manage his fluid and avoid ups/downs in his renal function. He agrees. Insurance denied him 2x     2. CAD - s/p CABG in 2014, followed by PCI to [place] in 2018 - LHC this admit showed progression of native vessel CAD and grafts. All vein grafts are functionally occluded with minimal left to right collaterals.  His LIMA is patent although his LAD is diffusely diseased (diabetic). No targets for intervention and not a candidate for redo CABG.  - No s/s ischemia. Followed by Dr. Gwenlyn Found - Cont ASA, Plavix, LA nitrate + ? blocker. Refusing statin    3. Mitral Regurgitation  - very mild  MR noted on TEE 11/21 after diuresis  - no change  4. IDDM - has insulin pump  - managed by Dr. Debbora Presto - see SGLT2i discussion above   5. Severe Hypertriglyceridemia  - has been as high as 6,00 - now follows with Dr. Debara Pickett with diagnosis of severe hyperchylomicronemia. His testing for FCS was genetically negative   Glori Bickers, MD  2:50 PM

## 2021-01-05 NOTE — Progress Notes (Signed)
Orders cancelled  Will add orders to research encounter

## 2021-01-05 NOTE — Addendum Note (Signed)
Addended by: Claudina Lick on: 01/05/2021 03:09 PM   Modules accepted: Orders

## 2021-01-05 NOTE — Addendum Note (Signed)
Addended by: Claudina Lick on: 01/05/2021 03:07 PM   Modules accepted: Orders

## 2021-01-05 NOTE — Addendum Note (Signed)
Addended by: Claudina Lick on: 01/05/2021 03:13 PM   Modules accepted: Orders

## 2021-01-05 NOTE — Research (Signed)
Pt came by today to leave urine sample for local testing. Urine sample sent to [facility].

## 2021-01-05 NOTE — Patient Instructions (Addendum)
Increase Torsemide to 40 mg (2 tabs) Twice daily FOR 3 DAYS, then back to 40 mg (2 tabs) Daily  **IF THIS DOES NOT IMPROVE YOUR SYMPTOMS PLEASE CALL us NEXT WEEK FOR ADDITIONAL INSTRUCTIONS  Your physician recommends that you schedule a follow-up appointment in: 4 months  Do the following things EVERYDAY: Weigh yourself in the morning before breakfast. Write it down and keep it in a log. Take your medicines as prescribed Eat low salt foods--Limit salt (sodium) to 2000 mg per day.  Stay as active as you can everyday Limit all fluids for the day to less than 2 liters  If you have any questions or concerns before your next appointment please send [place] a message through [place] or call our office at [phone].    TO LEAVE A MESSAGE FOR THE NURSE SELECT OPTION 2, PLEASE LEAVE A MESSAGE INCLUDING: YOUR NAME DATE OF BIRTH CALL BACK NUMBER REASON FOR CALL**this is important as we prioritize the call backs  YOU WILL RECEIVE A CALL BACK THE SAME DAY AS LONG AS YOU CALL BEFORE 4:00 PM  At the [facility], you and your health needs are our priority. As part of our continuing mission to provide you with exceptional heart care, we have created designated Provider Care Teams. These Care Teams include your primary Cardiologist (physician) and Advanced Practice Providers (APPs- Physician Assistants and Nurse Practitioners) who all work together to provide you with the care you need, when you need it.   You may see any of the following providers on your designated Care Team at your next follow up: Dr Glori Bickers Dr Haynes Kerns, NP Lyda Jester, [place] [facility] [place], [place] Audry Riles, PharmD   Please be sure to bring in all your medications bottles to every appointment.

## 2021-01-06 LAB — PROTEIN / CREATININE RATIO, URINE
Creatinine, Urine: 110.2 mg/dL
Protein, Ur: 190 mg/dL
Protein/Creat Ratio: 1724 mg/g creat — ABNORMAL HIGH (ref 0–200)

## 2021-01-06 LAB — URINALYSIS
Bilirubin, UA: NEGATIVE
Glucose, UA: NEGATIVE
Ketones, UA: NEGATIVE
Leukocytes,UA: NEGATIVE
Nitrite, UA: NEGATIVE
RBC, UA: NEGATIVE
Specific Gravity, UA: 1.018 (ref 1.005–1.030)
Urobilinogen, Ur: 0.2 mg/dL (ref 0.2–1.0)
pH, UA: 5 (ref 5.0–7.5)

## 2021-01-09 NOTE — Progress Notes (Addendum)
Core patient (506) redo urine and urine protein ratio, looks like its still to high, please review. Thanks Joelene Millin :) Almyra Free is out this week.    _________  UP/Cr ratio is too high - suspect this will disqualify him from the study, unfortunately.  Pixie Casino, MD, [facility], [place] Director of the Advanced Lipid Disorders &  Cardiovascular Risk Reduction Clinic Diplomate of the American Board of Clinical Lipidology Attending Cardiologist  Direct Dial: [phone]  Fax: [phone]  Website:  [email or web address]

## 2021-01-10 NOTE — Progress Notes (Signed)
See message from Dr Debara Pickett

## 2021-02-06 ENCOUNTER — Telehealth: Payer: Self-pay | Admitting: *Deleted

## 2021-02-06 NOTE — Telephone Encounter (Signed)
   [facility] Pre-operative Risk Assessment    Patient Name: PANTELIS ELGERSMA  DOB: 1966/03/10 MRN: [id number]  HEARTCARE STAFF:  - IMPORTANT!!!!!! Under Visit Info/Reason for Call, type in Other and utilize the format Clearance MM/DD/YY or Clearance TBD. Do not use dashes or single digits. - Please review there is not already an duplicate clearance open for this procedure. - If request is for dental extraction, please clarify the # of teeth to be extracted. - If the patient is currently at the dentist's office, call Pre-Op Callback Staff (MA/nurse) to input urgent request.  - If the patient is not currently in the dentist office, please route to the Pre-Op pool.  Request for surgical clearance:  What type of surgery is being performed? Colonoscopy and endoscopy   When is this surgery scheduled? 05/22/21  What type of clearance is required (medical clearance vs. Pharmacy clearance to hold med vs. Both)? both  Are there any medications that need to be held prior to surgery and how long? Plavix for 5 days prior to procedure  Practice name and name of physician performing surgery? [facility]  What is the office phone number? [phone]   7.   What is the office fax number? [phone]  8.   Anesthesia type (None, local, MAC, general) ? propofol   Fredia Beets 02/06/2021, 4:06 PM  _________________________________________________________________   (provider comments below)

## 2021-02-06 NOTE — Telephone Encounter (Signed)
    Patient Name: Juan [name]  DOB: 01-11-1966 MRN: [id number]  Primary Cardiologist: Quay Burow, MD  Cath 01/2020: "IMPRESSION: [name] unfortunately had progression of his native and graft disease.  All vein grafts are functionally occluded with minimal left to right collaterals.  His LIMA is patent although his LAD is a diabetic diffusely diseased vessel.  His systolic pressure is in the 90 range and his wedge pressure mean of 19 with a V wave of 27.  I reviewed his history, anatomy and physiology with Dr. Haroldine Laws in the advanced heart failure clinic who is agreed to take him on his service and optimize his medications, and oversee intravenous diuresis.  He may also need transesophageal echo to better define his mitral valve".  He was doing well when seen by Dr. Haroldine Laws 01/05/21.  Dr. Gwenlyn Found, Please provide you recommendation about Plavix. Please forward your response to P CV DIV PREOP.   Thank you   Leanor Kail, PA 02/06/2021, 4:32 PM

## 2021-02-07 NOTE — Telephone Encounter (Signed)
Patient has a follow-up visit with one of the APPs in the CHF Clinic on 05/08/2021 and colonoscopy is not scheduled until 05/22/2021. Therefore, pre-op risk assessment can be addressed at that visit. I will add "pre-op eval" to the appointment notes so that APP is aware.  Pre-op covering staff, can you let requesting office know that pre-op risk assessment will be addressed at visit in February?  Thank you!

## 2021-02-08 NOTE — Telephone Encounter (Signed)
Spoke with Crystal at [facility] and informed her that the patient has an appointment in February and his pre op will be addressed then. She voiced understanding.

## 2021-02-16 ENCOUNTER — Telehealth: Payer: Self-pay | Admitting: Internal Medicine

## 2021-02-16 NOTE — Telephone Encounter (Signed)
Pts spouse says test was ordered for the pt that was not covered and now they have a huge bill... wife would like to speak with the Dr. In regards to this please advise

## 2021-02-17 NOTE — Telephone Encounter (Signed)
Returned call to patient's wife left message on personal voice mail to call back. 

## 2021-02-17 NOTE — Telephone Encounter (Signed)
Spoke to patient's wife she is upset about the bill for genetic test.Stated it was not covered by [employer].She wanted to speak to Dr.Hilty's RN.Advised I will send message to her.

## 2021-02-20 NOTE — Telephone Encounter (Signed)
Called patient's wife about genetic test. Patient received a bill from [facility] for $299. She states that patient told her he was never informed that he could be billed $299 if not covered w/insurance. Explained to wife that I am the one who does the genetic test swab and I disclose this information to everyone tested, as I am sure Dr. Debara Pickett does as well. She said I can call her husband to discuss also.   There has been no correspondence from [facility] on our end that test was not covered or that additional clinical information was needed.   Will route to MD - can call patient's cell phone

## 2021-02-21 NOTE — Telephone Encounter (Signed)
Called Mr. Meir - explained that we had notified him there may be a cost up to $299 if not covered by insurance, which it appears it is not. Also, I suggested that they call [employer] and appeal the cost - they can likely get the cost reduced or make payments.  He was appreciative of that information.  Dr. Lemmie Evens

## 2021-03-15 ENCOUNTER — Other Ambulatory Visit (HOSPITAL_COMMUNITY): Payer: Self-pay | Admitting: Internal Medicine

## 2021-04-14 NOTE — Research (Addendum)
° ° °  I just received this today. Please review  and Thanks.  Not clinically significant - but that is why I wanted him in the trial.  Pixie Casino, MD, [facility], [place] Director of the Advanced Lipid Disorders &  Cardiovascular Risk Reduction Clinic Diplomate of the American Board of Clinical Lipidology Attending Cardiologist  Direct Dial: [phone]   Fax: [phone]  Website:  [email or web address]

## 2021-04-25 ENCOUNTER — Other Ambulatory Visit (HOSPITAL_COMMUNITY): Payer: Self-pay | Admitting: Internal Medicine

## 2021-05-05 NOTE — Progress Notes (Signed)
ADVANCED HF CLINIC NOTE  PCP: Juan Cha, MD Primary Cardiologist: Juan [name] HF: Juan [name]  HPI: Juan [name] is 56 y.o. male w/ h/o systolic HF, EF 29%, 3VCAD s/p CABG in 2014 followed by stenting to SVG-RCA in 2018, DM2, severe hypertriglyceridemia w/ subsequent episodes of pancreatitis.  Seen by Dr. Gwenlyn [name] 11/21 for progressive dyspnea and wt gain which was being managed by increasing home diuretics w/o much improvement in volume or symptoms. Echo repeated showing stable LVEF at 45%. RV systolic function low normal. Mild-mod MR also noted. Given persistent symptoms, he had LHC that showed progression of his native and graft disease.  All vein grafts are functionally occluded with minimal left to right collaterals.  His LIMA is patent although his LAD is diffusely diseased.  RHC showed mPCWP mildly elevated at 19. LVEDP 23. CO by Fick 3.25, CI 1.48, by thermo calculation CO 7, CI 3.2. Juan [name] was markedly edematous and admitted for management of acute on chronic systolic heart failure with IV diuretics. In addition, he had TTE which showed only mild-mod MR.  Diuresed with IV lasix and transitioned to [facility].  Echo 4/22 EF 50-55% mild MR   Follow up 10/22, volume up. Torsemide increased to 40 bid x 3 days.   Today he returns for HF follow up. He has new chest tightness with exertional activity that has been on-going x 2 months. Pain gets better with rest. He remains SOB with exertion. LE swelling has gotten worse the past couple of weeks and noticed he is not urinating as briskly as before. Denies abnormal bleeding, dizziness, palpitations, or PND/Orthopnea. Appetite ok. No fever or chills. Weight at home 236 pounds. Taking all medications. Remains thirsty, but tries to drink less than a gallon of fluid/day. Works at [employer].   ROS: All systems negative except as listed in HPI, PMH and Problem List.  SH:  Social History   Socioeconomic History   Marital status: Married     Spouse name: Juan [name]   Number of children: 1   Years of education: 16   Highest education level: Not on file  Occupational History   Occupation: Engineer, drilling     Employer: [employer]  Tobacco Use   Smoking status: Never   Smokeless tobacco: Never  Vaping Use   Vaping Use: Never used  Substance and Sexual Activity   Alcohol use: No   Drug use: No   Sexual activity: Yes  Other Topics Concern   Not on file  Social History Narrative   Adopted, so no pertinent FH.  Married.  Lives with wife in [place].  Independent of ADLs and ambulation.   Social Determinants of Health   Financial Resource Strain: Not on file  Food Insecurity: Not on file  Transportation Needs: Not on file  Physical Activity: Not on file  Stress: Not on file  Social Connections: Not on file  Intimate Partner Violence: Not on file   FH:  Family History  Adopted: Yes   Past Medical History:  Diagnosis Date   Abnormal cardiovascular stress test 02/06/2013   Anemia    occasional - no problems currently(10/02/2011)   Bilateral renal cysts 06/16/2011   states no known problems   Blood transfusion without reported diagnosis    CAD (coronary artery disease), LAD 90%, 1st diag 95%, LCX 70% wth AV groove 90%, RCA 40-50% mid and 80% long distal stenosis 02/03/13 02/04/2013   Chest pain    positive Myoview stress test   CHF (  congestive heart failure) (HCC)    Coronary artery calcification seen on CAT scan    Diabetes mellitus    IDDM   Fatty liver disease, nonalcoholic 5/40/9811   Hyperlipidemia    Hypertension    states no dx. of HTN, takes med. to protect kidneys due to DM   Lateral meniscus tear 09/2011   left   Loose body in knee 09/2011   loose bodies left knee   Non Hodgkin's lymphoma ([place]) 1991   Pancreatitis    occasional - last episode 06/2011   S/P CABG x 4, 02/05/13 LIMA-LAD; LT. RADIAL-OM;VG-DIAG; VG-PDA 02/06/2013   10/18 3/4 patent grafts (occluded SVG-->Diag),  PCI/DESx 3 SVG-->RCA, normal EF   Splenomegaly, congestive, chronic    Stuffy and runny nose 10/02/2011   yellow drainage from nose   Current Outpatient Medications  Medication Sig Dispense Refill   aspirin 81 MG chewable tablet Chew 1 tablet (81 mg total) by mouth daily.     clopidogrel (PLAVIX) 75 MG tablet TAKE 1 TABLET BY MOUTH EVERY DAY WITH BREAKFAST 90 tablet 3   Continuous Blood Gluc Receiver (DEXCOM G6 RECEIVER) DEVI See admin instructions.     fenofibrate 160 MG tablet Take 160 mg by mouth daily.      Glucose Blood (BAYER CONTOUR NEXT TEST VI) glucose testing     insulin regular human CONCENTRATED (HUMULIN R) 500 UNIT/ML injection Inject into the skin continuous. Using insulin pump     isosorbide mononitrate (IMDUR) 30 MG 24 hr tablet TAKE 1 TABLET BY MOUTH EVERY DAY 90 tablet 3   metFORMIN (GLUCOPHAGE) 1000 MG tablet Take 1,000 mg by mouth 2 (two) times daily with a meal.     metoprolol succinate (TOPROL-XL) 25 MG 24 hr tablet TAKE 3 TABLETS BY MOUTH TWICE A DAY WITH OR IMMEDIATELY FOLLOWING A MEAL 540 tablet 3   nitroGLYCERIN (NITROSTAT) 0.4 MG SL tablet Place 1 tablet (0.4 mg total) under the tongue every 5 (five) minutes as needed. 25 tablet 2   Propylene Glycol (SYSTANE BALANCE) 0.6 % SOLN Place 1 drop into both eyes daily as needed (dry eyes).     sacubitril-valsartan (ENTRESTO) 49-51 MG Take 1 tablet by mouth 2 (two) times daily. 60 tablet 6   spironolactone (ALDACTONE) 25 MG tablet Take 1 tablet (25 mg total) by mouth daily. 90 tablet 3   torsemide (DEMADEX) 20 MG tablet TAKE 2 TABLETS BY MOUTH EVERY DAY 180 tablet 3   TRESIBA FLEXTOUCH 200 UNIT/ML FlexTouch Pen Inject 36 Units into the skin daily.     VASCEPA 1 g capsule TAKE 2 CAPSULES BY MOUTH TWICE A DAY 360 capsule 4   No current facility-administered medications for this encounter.   BP (!) 100/56    Pulse 88    Wt 108.7 kg (239 lb 9.6 oz)    SpO2 96%    BMI 34.38 kg/m   Wt Readings from Last 3 Encounters:   05/08/21 108.7 kg (239 lb 9.6 oz)  01/05/21 109.2 kg (240 lb 12.8 oz)  12/19/20 108.9 kg (240 lb)   PHYSICAL EXAM: General:  NAD. No resp difficulty HEENT: Normal Neck: Supple. JVP 7-8. Carotids 2+ bilat; no bruits. No lymphadenopathy or thryomegaly appreciated. Cor: PMI nondisplaced. Regular rate & rhythm. No rubs, gallops or murmurs. Lungs: Clear Abdomen: Obese, nontender, nondistended. No hepatosplenomegaly. No bruits or masses. Good bowel sounds. Extremities: No cyanosis, clubbing, rash, 2+ BLE edema to knees R>L Neuro: Alert & oriented x 3, cranial nerves grossly intact. Moves all  4 extremities w/o difficulty. Affect pleasant.  ECG: SR with 1st degree AVB, PR 225 msec, no ST-T changes from prior ECG 10/2020 (personally reviewed).  ReDs: 38%  ASSESSMENT & PLAN: 1. Acute on Chronic Systolic Heart Failure - Echo 2017 showed normal LVEF 60-65% - Echo 4/21, EF mildly reduced 45%. NST showed scar but no ischemia - Echo 11/21, EF 89%, RV systolic function low normal  - RHC 11/21 showed mildly elevated filling pressures, PCWP 19. LVEDP 23. CO by Fick 3.25, CI 1.48, by thermo calculation CO 7, CI 3.2.  - Echo 4/22 50-55%  - NYHA II. He is volume overloaded on exam, ReDs 38%.  - Increase torsemide to 40 mg bid. - Continue spiro 25 mg daily. - Continue Entresto 49/51 mg bid. - Continue Toprol 75 mg bid. - Continue compression stockings. - Ideally would like to add SGLT2i but he is functionally DM1.5 so I have asked him to discuss with Dr. Debbora Presto if this is ok. She said it was ok to try but stopped because he got cellulitis. - I think Cardiomems will help better manage his fluid and avoid ups/downs in his renal function. He agrees. Insurance denied him 2x. - BMET and BNP today, repeat BMET next week.    2. CAD - s/p CABG in 2014, followed by PCI to [place] in 2018 - [place] 12/21 showed progression of native vessel CAD and grafts. All vein grafts are functionally occluded with minimal L->R  collaterals.  His LIMA is patent although his LAD is diffusely diseased (diabetic). No targets for intervention and not a candidate for redo CABG.  - Now with new chest pain and increasing dyspnea. Likely due to volume on board, but with his significant history, will update echo to ensure EF not further reduced. - ECG non ischemic today. - Followed by Dr. Gwenlyn [name]. - Cont ASA, Plavix, LA nitrate + ? blocker. Refusing statin.  - Will see back in 1-2 weeks, if CP no better, will arrange cath to evaluate known disease and collaterals. Discussed with Juan [name].   3. Mitral Regurgitation  - Very mild MR noted on TEE 11/21 after diuresis  - No change.  4. IDDM - Has insulin pump.  - Managed by Dr. Debbora Presto - see SGLT2i discussion above.   5. Severe Hypertriglyceridemia  - Has been as high as 6,000 - Now follows with Dr. Debara Pickett with diagnosis of severe hyperchylomicronemia. His testing for FCS was genetically negative  6. Pre-procedure cardiac risk stratification - With on-going chest pain, he is not at an acceptable risk for colonoscopy/endoscopy at this time. - Will see him back in 1-2 weeks after diuresis and re-evaluate. Discussed with Juan [name].  Follow up with APP in 1-2 weeks (may need a dose of metolazone) and in 4 months with Dr. Vaughan Browner.  Rafael Bihari, FNP  10:02 AM

## 2021-05-08 ENCOUNTER — Other Ambulatory Visit: Payer: Self-pay

## 2021-05-08 ENCOUNTER — Encounter (HOSPITAL_COMMUNITY): Payer: Self-pay

## 2021-05-08 ENCOUNTER — Ambulatory Visit (HOSPITAL_COMMUNITY)
Admission: RE | Admit: 2021-05-08 | Discharge: 2021-05-08 | Disposition: A | Discharge status: Home / Self Care | Payer: Commercial Managed Care - PPO | Source: Ambulatory Visit | Attending: Family Medicine | Admitting: Family Medicine

## 2021-05-08 VITALS — BP 100/56 | HR 88 | Wt 239.6 lb

## 2021-05-08 DIAGNOSIS — Z8719 Personal history of other diseases of the digestive system: Secondary | ICD-10-CM | POA: Insufficient documentation

## 2021-05-08 DIAGNOSIS — Z01818 Encounter for other preprocedural examination: Secondary | ICD-10-CM

## 2021-05-08 DIAGNOSIS — R079 Chest pain, unspecified: Secondary | ICD-10-CM | POA: Diagnosis not present

## 2021-05-08 DIAGNOSIS — E119 Type 2 diabetes mellitus without complications: Secondary | ICD-10-CM | POA: Diagnosis not present

## 2021-05-08 DIAGNOSIS — I5042 Chronic combined systolic (congestive) and diastolic (congestive) heart failure: Secondary | ICD-10-CM

## 2021-05-08 DIAGNOSIS — I340 Nonrheumatic mitral (valve) insufficiency: Secondary | ICD-10-CM | POA: Diagnosis not present

## 2021-05-08 DIAGNOSIS — Z79899 Other long term (current) drug therapy: Secondary | ICD-10-CM | POA: Insufficient documentation

## 2021-05-08 DIAGNOSIS — Z951 Presence of aortocoronary bypass graft: Secondary | ICD-10-CM | POA: Diagnosis not present

## 2021-05-08 DIAGNOSIS — I2510 Atherosclerotic heart disease of native coronary artery without angina pectoris: Secondary | ICD-10-CM | POA: Diagnosis not present

## 2021-05-08 DIAGNOSIS — E781 Pure hyperglyceridemia: Secondary | ICD-10-CM | POA: Diagnosis not present

## 2021-05-08 DIAGNOSIS — I110 Hypertensive heart disease with heart failure: Secondary | ICD-10-CM | POA: Insufficient documentation

## 2021-05-08 DIAGNOSIS — I5023 Acute on chronic systolic (congestive) heart failure: Secondary | ICD-10-CM | POA: Insufficient documentation

## 2021-05-08 DIAGNOSIS — Z794 Long term (current) use of insulin: Secondary | ICD-10-CM | POA: Insufficient documentation

## 2021-05-08 DIAGNOSIS — Z955 Presence of coronary angioplasty implant and graft: Secondary | ICD-10-CM | POA: Insufficient documentation

## 2021-05-08 LAB — BASIC METABOLIC PANEL
Anion gap: 10 (ref 5–15)
BUN: 31 mg/dL — ABNORMAL HIGH (ref 6–20)
CO2: 26 mmol/L (ref 22–32)
Calcium: 10.1 mg/dL (ref 8.9–10.3)
Chloride: 98 mmol/L (ref 98–111)
Creatinine, Ser: 1.73 mg/dL — ABNORMAL HIGH (ref 0.61–1.24)
GFR, Estimated: 46 mL/min — ABNORMAL LOW (ref >60)
Glucose, Bld: 292 mg/dL — ABNORMAL HIGH (ref 70–99)
Potassium: 4.7 mmol/L (ref 3.5–5.1)
Sodium: 134 mmol/L — ABNORMAL LOW (ref 135–145)

## 2021-05-08 LAB — BRAIN NATRIURETIC PEPTIDE: B Natriuretic Peptide: 389.1 pg/mL — ABNORMAL HIGH (ref 0.0–100.0)

## 2021-05-08 MED ORDER — TORSEMIDE 20 MG PO TABS: 40.0000 mg | ORAL_TABLET | Freq: Two times a day (BID) | ORAL | 5 refills | Status: DC

## 2021-05-08 NOTE — Progress Notes (Signed)
ReDS Vest / Clip - 05/08/21 1000       ReDS Vest / Clip   Station Marker D    Ruler Value 37.5    ReDS Value Range Moderate volume overload    ReDS Actual Value 38

## 2021-05-08 NOTE — Patient Instructions (Addendum)
Thank you for coming in today  Labs were done today, if any labs are abnormal the clinic will call you  INCREASE Torsemide to 40 mg 2 tablets twice daily   Your physician recommends that you schedule a follow-up appointment in:  10-14 days in clinic 4 months with DB  Your physician recommends that you return for lab work in: 1 week  Your physician has requested that you have an echocardiogram. Echocardiography is a painless test that uses sound waves to create images of your heart. It provides your doctor with information about the size and shape of your heart and how well your hearts chambers and valves are working. This procedure takes approximately one hour. There are no restrictions for this procedure.   At the [facility], you and your health needs are our priority. As part of our continuing mission to provide you with exceptional heart care, we have created designated Provider Care Teams. These Care Teams include your primary Cardiologist (physician) and Advanced Practice Providers (APPs- Physician Assistants and Nurse Practitioners) who all work together to provide you with the care you need, when you need it.   You may see any of the following providers on your designated Care Team at your next follow up: Dr Glori Bickers Dr Haynes Kerns, NP Lyda Jester, [place] [facility], [place] Audry Riles, PharmD   Please be sure to bring in all your medications bottles to every appointment.   If you have any questions or concerns before your next appointment please send [place] a message through [place] or call our office at [phone].    TO LEAVE A MESSAGE FOR THE NURSE SELECT OPTION 2, PLEASE LEAVE A MESSAGE INCLUDING: YOUR NAME DATE OF BIRTH CALL BACK NUMBER REASON FOR CALL**this is important as we prioritize the call backs  YOU WILL RECEIVE A CALL BACK THE SAME DAY AS LONG AS YOU CALL BEFORE 4:00 PM

## 2021-05-16 ENCOUNTER — Ambulatory Visit (HOSPITAL_COMMUNITY)
Admission: RE | Admit: 2021-05-16 | Discharge: 2021-05-16 | Disposition: A | Discharge status: Home / Self Care | Payer: Commercial Managed Care - PPO | Source: Ambulatory Visit | Attending: Cardiology | Admitting: Cardiology

## 2021-05-16 ENCOUNTER — Other Ambulatory Visit: Payer: Self-pay

## 2021-05-16 DIAGNOSIS — I5042 Chronic combined systolic (congestive) and diastolic (congestive) heart failure: Secondary | ICD-10-CM

## 2021-05-16 LAB — BASIC METABOLIC PANEL
Anion gap: 7 (ref 5–15)
BUN: 43 mg/dL — ABNORMAL HIGH (ref 6–20)
CO2: 24 mmol/L (ref 22–32)
Calcium: 10 mg/dL (ref 8.9–10.3)
Chloride: 102 mmol/L (ref 98–111)
Creatinine, Ser: 1.43 mg/dL — ABNORMAL HIGH (ref 0.61–1.24)
GFR, Estimated: 58 mL/min — ABNORMAL LOW (ref >60)
Glucose, Bld: 174 mg/dL — ABNORMAL HIGH (ref 70–99)
Potassium: 4.4 mmol/L (ref 3.5–5.1)
Sodium: 133 mmol/L — ABNORMAL LOW (ref 135–145)

## 2021-05-19 NOTE — Progress Notes (Signed)
ADVANCED HF CLINIC NOTE  PCP: Juan Cha, MD Primary Cardiologist: Juan [name] HF: Juan [name]  HPI: Juan [name] is 56 y.o. male w/ h/o systolic HF, EF 35%, 3VCAD s/p CABG in 2014 followed by stenting to SVG-RCA in 2018, DM2, severe hypertriglyceridemia w/ subsequent episodes of pancreatitis.  Seen by Dr. Gwenlyn [name] 11/21 for progressive dyspnea and wt gain which was being managed by increasing home diuretics w/o much improvement in volume or symptoms. Echo repeated showing stable LVEF at 45%. RV systolic function low normal. Mild-mod MR also noted. Given persistent symptoms, he had LHC that showed progression of his native and graft disease.  All vein grafts are functionally occluded with minimal left to right collaterals.  His LIMA is patent although his LAD is diffusely diseased.  RHC showed mPCWP mildly elevated at 19. LVEDP 23. CO by Fick 3.25, CI 1.48, by thermo calculation CO 7, CI 3.2. Juan [name] was markedly edematous and admitted for management of acute on chronic systolic heart failure with IV diuretics. In addition, he had TTE which showed only mild-mod MR.  Diuresed with IV lasix and transitioned to [facility].  Echo 4/22 EF 50-55% mild MR   Follow up 2/23 he had new exertional chest tightness and volume was up. Torsemide increased to 40 bid and echo arranged.   Echo today 05/22/21 showed EF 50%, mild LVH, RV mildly reduced, aortic root dilation 37 mm  Today he returns for HF follow up. Overall feeling better. Weight is down 4 lbs since increased torsemide and LE has improved. No further chest pain or SOB with work duties. Denies abnormal bleeding, palpitations, dizziness, or PND/Orthopnea. Appetite ok. No fever or chills. Weight at home 234 pounds. Taking all medications. Works fulltime at [employer].  ROS: All systems negative except as listed in HPI, PMH and Problem List.  SH:  Social History   Socioeconomic History   Marital status: Married    Spouse name: Juan [name]    Number of children: 1   Years of education: 16   Highest education level: Not on file  Occupational History   Occupation: Engineer, drilling     Employer: [employer]  Tobacco Use   Smoking status: Never   Smokeless tobacco: Never  Vaping Use   Vaping Use: Never used  Substance and Sexual Activity   Alcohol use: No   Drug use: No   Sexual activity: Yes  Other Topics Concern   Not on file  Social History Narrative   Adopted, so no pertinent FH.  Married.  Lives with wife in [place].  Independent of ADLs and ambulation.   Social Determinants of Health   Financial Resource Strain: Not on file  Food Insecurity: Not on file  Transportation Needs: Not on file  Physical Activity: Not on file  Stress: Not on file  Social Connections: Not on file  Intimate Partner Violence: Not on file   FH:  Family History  Adopted: Yes   Past Medical History:  Diagnosis Date   Abnormal cardiovascular stress test 02/06/2013   Anemia    occasional - no problems currently(10/02/2011)   Bilateral renal cysts 06/16/2011   states no known problems   Blood transfusion without reported diagnosis    CAD (coronary artery disease), LAD 90%, 1st diag 95%, LCX 70% wth AV groove 90%, RCA 40-50% mid and 80% long distal stenosis 02/03/13 02/04/2013   Chest pain    positive Myoview stress test   CHF (congestive heart failure) ([place])    Coronary  artery calcification seen on CAT scan    Diabetes mellitus    IDDM   Fatty liver disease, nonalcoholic 3/53/2992   Hyperlipidemia    Hypertension    states no dx. of HTN, takes med. to protect kidneys due to DM   Lateral meniscus tear 09/2011   left   Loose body in knee 09/2011   loose bodies left knee   Non Hodgkin's lymphoma ([place]) 1991   Pancreatitis    occasional - last episode 06/2011   S/P CABG x 4, 02/05/13 LIMA-LAD; LT. RADIAL-OM;VG-DIAG; VG-PDA 02/06/2013   10/18 3/4 patent grafts (occluded SVG-->Diag), PCI/DESx 3 SVG-->RCA, normal EF    Splenomegaly, congestive, chronic    Stuffy and runny nose 10/02/2011   yellow drainage from nose   Current Outpatient Medications  Medication Sig Dispense Refill   aspirin 81 MG chewable tablet Chew 1 tablet (81 mg total) by mouth daily.     clopidogrel (PLAVIX) 75 MG tablet TAKE 1 TABLET BY MOUTH EVERY DAY WITH BREAKFAST 90 tablet 3   Continuous Blood Gluc Receiver (DEXCOM G6 RECEIVER) DEVI See admin instructions.     fenofibrate 160 MG tablet Take 160 mg by mouth daily.      Glucose Blood (BAYER CONTOUR NEXT TEST VI) glucose testing     insulin regular human CONCENTRATED (HUMULIN R) 500 UNIT/ML injection Inject into the skin continuous. Using insulin pump     isosorbide mononitrate (IMDUR) 30 MG 24 hr tablet TAKE 1 TABLET BY MOUTH EVERY DAY 90 tablet 3   metFORMIN (GLUCOPHAGE) 1000 MG tablet Take 1,000 mg by mouth 2 (two) times daily with a meal.     metoprolol succinate (TOPROL-XL) 25 MG 24 hr tablet TAKE 3 TABLETS BY MOUTH TWICE A DAY WITH OR IMMEDIATELY FOLLOWING A MEAL 540 tablet 3   nitroGLYCERIN (NITROSTAT) 0.4 MG SL tablet Place 1 tablet (0.4 mg total) under the tongue every 5 (five) minutes as needed. 25 tablet 2   Propylene Glycol (SYSTANE BALANCE) 0.6 % SOLN Place 1 drop into both eyes daily as needed (dry eyes).     sacubitril-valsartan (ENTRESTO) 49-51 MG Take 1 tablet by mouth 2 (two) times daily. 60 tablet 6   spironolactone (ALDACTONE) 25 MG tablet Take 1 tablet (25 mg total) by mouth daily. 90 tablet 3   torsemide (DEMADEX) 20 MG tablet Take 2 tablets (40 mg total) by mouth 2 (two) times daily. 120 tablet 5   TRESIBA FLEXTOUCH 200 UNIT/ML FlexTouch Pen Inject 36 Units into the skin daily.     VASCEPA 1 g capsule TAKE 2 CAPSULES BY MOUTH TWICE A DAY 360 capsule 4   No current facility-administered medications for this encounter.   BP (!) 90/52    Pulse 84    Wt 106.9 kg (235 lb 9.6 oz)    SpO2 95%    BMI 33.81 kg/m   Wt Readings from Last 3 Encounters:  05/22/21 106.9 kg  (235 lb 9.6 oz)  05/08/21 108.7 kg (239 lb 9.6 oz)  01/05/21 109.2 kg (240 lb 12.8 oz)   PHYSICAL EXAM: General:  NAD. No resp difficulty HEENT: Normal Neck: Supple. No JVD. Carotids 2+ bilat; no bruits. No lymphadenopathy or thryomegaly appreciated. Cor: PMI nondisplaced. Regular rate & rhythm. No rubs, gallops or murmurs. Lungs: Clear Abdomen: Obese, nontender, nondistended. No hepatosplenomegaly. No bruits or masses. Good bowel sounds. Extremities: No cyanosis, clubbing, rash, 1+ BLE edema to knees R>L Neuro: Alert & oriented x 3, cranial nerves grossly intact. Moves all 4  extremities w/o difficulty. Affect pleasant.  ReDs: 33%  ASSESSMENT & PLAN: 1. Chronic Systolic Heart Failure - Echo 2017 showed normal LVEF 60-65% - Echo 4/21, EF mildly reduced 45%. NST showed scar but no ischemia - Echo 11/21, EF 40%, RV systolic function low normal  - RHC 11/21 showed mildly elevated filling pressures, PCWP 19. LVEDP 23. CO by Fick 3.25, CI 1.48, by thermo calculation CO 7, CI 3.2.  - Echo 4/22 50-55%  - Echo today, 05/22/21 EF 50%  - NYHA II. Volume looks good today, ReDs 33%. No BP room to increase GDMT today. - Continue torsemide 40 mg bid. - Continue spiro 25 mg daily. - Continue Entresto 49/51 mg bid. - Continue Toprol 75 mg bid. - Continue compression stockings. - Ideally would like to add SGLT2i but he is functionally DM1.5 so I have asked him to discuss with Dr. Debbora Presto if this is ok. She said it was ok to try but stopped because he got cellulitis. - I think Cardiomems will help better manage his fluid and avoid ups/downs in his renal function. He agrees. Insurance denied him 2x. - Labs last week reviewed and look ok, K 4.4 SCr 1.43    2. CAD - s/p CABG in 2014, followed by PCI to [facility] in 2018 - [place] 12/21 showed progression of native vessel CAD and grafts. All vein grafts are functionally occluded with minimal L->R collaterals.  His LIMA is patent although his LAD is diffusely  diseased (diabetic). No targets for intervention and not a candidate for redo CABG.  - No further CP. - Followed by Dr. Gwenlyn [name]. - Cont ASA, Plavix, LA nitrate + ? blocker. Refusing statin.    3. Mitral Regurgitation  - Very mild MR noted on TEE 11/21 after diuresis.  - Trivial on echo today.  4. IDDM - Has insulin pump.  - Managed by Dr. Debbora Presto. - See SGLT2i discussion above.   5. Severe Hypertriglyceridemia  - Has been as high as 6,000 - Now follows with Dr. Debara Pickett with diagnosis of severe hyperchylomicronemia. His testing for FCS was genetically negative  6. Pre-procedure cardiac risk stratification - Chest pain improved with diuresis. Echo today stable. - He is at an acceptable risk for colonoscopy/endoscopy.   Follow up in 4 months with Dr. Vaughan Browner as scheduled.  Rafael Bihari, FNP  1:59 PM

## 2021-05-22 ENCOUNTER — Other Ambulatory Visit: Payer: Self-pay

## 2021-05-22 ENCOUNTER — Ambulatory Visit (HOSPITAL_COMMUNITY)
Admission: RE | Admit: 2021-05-22 | Discharge: 2021-05-22 | Disposition: A | Discharge status: Home / Self Care | Payer: Commercial Managed Care - PPO | Source: Ambulatory Visit | Attending: Internal Medicine | Admitting: Internal Medicine

## 2021-05-22 ENCOUNTER — Encounter (HOSPITAL_COMMUNITY): Payer: Self-pay

## 2021-05-22 ENCOUNTER — Ambulatory Visit (HOSPITAL_BASED_OUTPATIENT_CLINIC_OR_DEPARTMENT_OTHER)
Admission: RE | Admit: 2021-05-22 | Discharge: 2021-05-22 | Disposition: A | Discharge status: Home / Self Care | Payer: Commercial Managed Care - PPO | Source: Ambulatory Visit

## 2021-05-22 VITALS — BP 90/52 | HR 84 | Wt 235.6 lb

## 2021-05-22 DIAGNOSIS — I110 Hypertensive heart disease with heart failure: Secondary | ICD-10-CM | POA: Diagnosis not present

## 2021-05-22 DIAGNOSIS — Z7902 Long term (current) use of antithrombotics/antiplatelets: Secondary | ICD-10-CM | POA: Insufficient documentation

## 2021-05-22 DIAGNOSIS — E119 Type 2 diabetes mellitus without complications: Secondary | ICD-10-CM | POA: Insufficient documentation

## 2021-05-22 DIAGNOSIS — I2510 Atherosclerotic heart disease of native coronary artery without angina pectoris: Secondary | ICD-10-CM

## 2021-05-22 DIAGNOSIS — Z9641 Presence of insulin pump (external) (internal): Secondary | ICD-10-CM | POA: Diagnosis not present

## 2021-05-22 DIAGNOSIS — I080 Rheumatic disorders of both mitral and aortic valves: Secondary | ICD-10-CM | POA: Diagnosis not present

## 2021-05-22 DIAGNOSIS — Z01818 Encounter for other preprocedural examination: Secondary | ICD-10-CM

## 2021-05-22 DIAGNOSIS — I5022 Chronic systolic (congestive) heart failure: Secondary | ICD-10-CM | POA: Insufficient documentation

## 2021-05-22 DIAGNOSIS — Z794 Long term (current) use of insulin: Secondary | ICD-10-CM | POA: Insufficient documentation

## 2021-05-22 DIAGNOSIS — I340 Nonrheumatic mitral (valve) insufficiency: Secondary | ICD-10-CM | POA: Diagnosis not present

## 2021-05-22 DIAGNOSIS — I5042 Chronic combined systolic (congestive) and diastolic (congestive) heart failure: Secondary | ICD-10-CM

## 2021-05-22 DIAGNOSIS — Z951 Presence of aortocoronary bypass graft: Secondary | ICD-10-CM | POA: Diagnosis not present

## 2021-05-22 DIAGNOSIS — Z79899 Other long term (current) drug therapy: Secondary | ICD-10-CM | POA: Diagnosis not present

## 2021-05-22 DIAGNOSIS — Z955 Presence of coronary angioplasty implant and graft: Secondary | ICD-10-CM | POA: Diagnosis not present

## 2021-05-22 DIAGNOSIS — E781 Pure hyperglyceridemia: Secondary | ICD-10-CM

## 2021-05-22 LAB — ECHOCARDIOGRAM COMPLETE
Area-P 1/2: 4.8 cm2
Calc EF: 42.7 %
S' Lateral: 3.6 cm
Single Plane A2C EF: 39.6 %
Single Plane A4C EF: 46.6 %

## 2021-05-22 NOTE — Patient Instructions (Signed)
Thank you for coming in today ? ?No medication changes today ? ?No labs today ? ?Your physician recommends that you schedule a follow-up appointment in: ?Keep scheduled appointment with Dr. Haroldine Laws ? ?At the [facility], you and your health needs are our priority. As part of our continuing mission to provide you with exceptional heart care, we have created designated Provider Care Teams. These Care Teams include your primary Cardiologist (physician) and Advanced Practice Providers (APPs- Physician Assistants and Nurse Practitioners) who all work together to provide you with the care you need, when you need it.  ? ?You may see any of the following providers on your designated Care Team at your next follow up: ?Dr Glori Bickers ?Dr Loralie Champagne ?Darrick Grinder, NP ?Lyda Jester, PA ?Jessica Milford,NP ?Marlyce Huge, PA ?Audry Riles, PharmD ? ? ?Please be sure to bring in all your medications bottles to every appointment.  ? ?If you have any questions or concerns before your next appointment please send [place] a message through [place] or call our office at [phone].   ? ?TO LEAVE A MESSAGE FOR THE NURSE SELECT OPTION 2, PLEASE LEAVE A MESSAGE INCLUDING: ?YOUR NAME ?DATE OF BIRTH ?CALL BACK NUMBER ?REASON FOR CALL**this is important as we prioritize the call backs ? ?YOU WILL RECEIVE A CALL BACK THE SAME DAY AS LONG AS YOU CALL BEFORE 4:00 PM ? ? ?

## 2021-05-22 NOTE — Progress Notes (Signed)
ReDS Vest / Clip - 05/22/21 1300   ? ?  ? ReDS Vest / Clip  ? Station Marker D   ? Ruler Value 33   ? ReDS Value Range Low volume   ? ReDS Actual Value 33   ? ?  ?  ? ?  ? ? ?

## 2021-05-22 NOTE — Progress Notes (Signed)
?  Echocardiogram ?2D Echocardiogram has been performed. ? ?Bobbye Charleston ?05/22/2021, 1:36 PM ?

## 2021-05-26 NOTE — Research (Unsigned)
Medications Reviewed ?Fenofibrate-noted 06/16/11 for hyperlipidemia by Dr. Margreta Journey Rama ?Vascepa-ordered on 02/07/18 for hyperchylomicronemia by Dr. Lyman Bishop ?Entresto-ordered 02/15/20 for acute on chronic systolic heart failure by Dr. Charlett Blake ?Tresiba-ordered 09/29/20 for type 2 DM with hyperglycemia by Dr. [name] ?Clopidogrel-ordered on 05/23/20 for coronary artery disease by Dr. Quay Burow ?Metformin-ordered 03/15/21 for type 2 DM with hyperglycemia by Dr. [name] ?

## 2021-06-07 ENCOUNTER — Other Ambulatory Visit: Payer: Self-pay | Admitting: Cardiovascular Disease

## 2021-06-27 ENCOUNTER — Other Ambulatory Visit: Payer: Self-pay | Admitting: Internal Medicine

## 2021-06-27 DIAGNOSIS — I739 Peripheral vascular disease, unspecified: Secondary | ICD-10-CM

## 2021-06-28 ENCOUNTER — Ambulatory Visit
Admission: RE | Admit: 2021-06-28 | Discharge: 2021-06-28 | Disposition: A | Discharge status: Home / Self Care | Payer: Commercial Managed Care - PPO | Source: Ambulatory Visit | Attending: Internal Medicine | Admitting: Internal Medicine

## 2021-06-28 ENCOUNTER — Other Ambulatory Visit: Payer: Self-pay | Admitting: Internal Medicine

## 2021-06-28 DIAGNOSIS — I739 Peripheral vascular disease, unspecified: Secondary | ICD-10-CM

## 2021-07-03 ENCOUNTER — Other Ambulatory Visit: Payer: Commercial Managed Care - PPO

## 2021-07-03 ENCOUNTER — Ambulatory Visit
Admission: RE | Admit: 2021-07-03 | Discharge: 2021-07-03 | Disposition: A | Discharge status: Home / Self Care | Payer: Commercial Managed Care - PPO | Source: Ambulatory Visit | Attending: Internal Medicine | Admitting: Internal Medicine

## 2021-07-03 DIAGNOSIS — I739 Peripheral vascular disease, unspecified: Secondary | ICD-10-CM

## 2021-07-04 ENCOUNTER — Ambulatory Visit
Admission: RE | Admit: 2021-07-04 | Discharge: 2021-07-04 | Disposition: A | Discharge status: Home / Self Care | Payer: Commercial Managed Care - PPO | Source: Ambulatory Visit | Attending: Internal Medicine | Admitting: Internal Medicine

## 2021-07-04 DIAGNOSIS — I739 Peripheral vascular disease, unspecified: Secondary | ICD-10-CM

## 2021-07-14 ENCOUNTER — Ambulatory Visit: Payer: Commercial Managed Care - PPO | Admitting: Surgery

## 2021-07-14 ENCOUNTER — Encounter: Payer: Self-pay | Admitting: Surgery

## 2021-07-14 ENCOUNTER — Other Ambulatory Visit: Payer: Self-pay

## 2021-07-14 VITALS — BP 128/77 | HR 86 | Temp 98.4°F | Resp 20 | Ht 70.0 in | Wt 234.0 lb

## 2021-07-14 DIAGNOSIS — I70212 Atherosclerosis of native arteries of extremities with intermittent claudication, left leg: Secondary | ICD-10-CM

## 2021-07-14 NOTE — H&P (View-Only) (Signed)
? ?[facility] ? ?Patient name: Juan [name] MRN: [id number] DOB: November 21, 1965 Sex: male ? ? ?REQUESTING PROVIDER:  ? ? Dr. Fara Olden ? ? ?REASON FOR CONSULT:  ?  ?PAD ? ?HISTORY OF PRESENT ILLNESS:  ? ?Juan [name] is a 56 y.o. male, who is referred for evaluation of left leg pain.  He states that he first noticed this about 3 weeks ago when he was walking around [employer] the campus.  He was not able to walk very far.  And then 2 weeks ago he woke up with pain and numbness in his left leg.  He gets pain along the outside of his calf with minimal ambulation.  He is not having rest pain. ? ?He does suffer from bilateral lower extremity edema secondary to congestive heart failure which has been present since 2009.  He is a diabetic with a recent A1c of 9.2.  He also suffers from severe hypertriglyceridemia.  He is a non-smoker.  He has a history of coronary artery disease, status post stenting as well as CABG. ? ?PAST MEDICAL HISTORY  ? ? ?Past Medical History:  ?Diagnosis Date  ? Abnormal cardiovascular stress test 02/06/2013  ? Anemia   ? occasional - no problems currently(10/02/2011)  ? Bilateral renal cysts 06/16/2011  ? states no known problems  ? Blood transfusion without reported diagnosis   ? CAD (coronary artery disease), LAD 90%, 1st diag 95%, LCX 70% wth AV groove 90%, RCA 40-50% mid and 80% long distal stenosis 02/03/13 02/04/2013  ? Chest pain   ? positive Myoview stress test  ? CHF (congestive heart failure) ([place])   ? Coronary artery calcification seen on CAT scan   ? Diabetes mellitus   ? IDDM  ? Fatty liver disease, nonalcoholic 04/28/4707  ? Hyperlipidemia   ? Hypertension   ? states no dx. of HTN, takes med. to protect kidneys due to DM  ? Lateral meniscus tear 09/2011  ? left  ? Loose body in knee 09/2011  ? loose bodies left knee  ? Non Hodgkin's lymphoma ([place]) 1991  ? Pancreatitis   ? occasional - last episode 06/2011  ? S/P CABG x 4,  02/05/13 LIMA-LAD; LT. RADIAL-OM;VG-DIAG; VG-PDA 02/06/2013  ? 10/18 3/4 patent grafts (occluded SVG-->Diag), PCI/DESx 3 SVG-->RCA, normal EF  ? Splenomegaly, congestive, chronic   ? Stuffy and runny nose 10/02/2011  ? yellow drainage from nose  ? ? ? ?FAMILY HISTORY  ? ?Family History  ?Adopted: Yes  ? ? ?SOCIAL HISTORY:  ? ?Social History  ? ?Socioeconomic History  ? Marital status: Married  ?  Spouse name: Juan [name]  ? Number of children: 1  ? Years of education: 82  ? Highest education level: Not on file  ?Occupational History  ? Occupation: Engineer, drilling   ?  Employer: [employer]  ?Tobacco Use  ? Smoking status: Never  ? Smokeless tobacco: Never  ?Vaping Use  ? Vaping Use: Never used  ?Substance and Sexual Activity  ? Alcohol use: No  ? Drug use: No  ? Sexual activity: Yes  ?Other Topics Concern  ? Not on file  ?Social History Narrative  ? Adopted, so no pertinent FH.  Married.  Lives with wife in [place].  Independent of ADLs and ambulation.  ? ?Social Determinants of Health  ? ?Financial Resource Strain: Not on file  ?Food Insecurity: Not on file  ?Transportation Needs: Not on file  ?Physical Activity: Not on file  ?Stress:  Not on file  ?Social Connections: Not on file  ?Intimate Partner Violence: Not on file  ? ? ?ALLERGIES:  ? ? ?Allergies  ?Allergen Reactions  ? Reglan [Metoclopramide] Other (See Comments)  ?  Only can tolerate in low doses, in higher doses it has the opposite effect  ? Sglt2 Inhibitors   ?  Cellulitis with Vania Rea and Wilder Glade  ? ? ?CURRENT MEDICATIONS:  ? ? ?Current Outpatient Medications  ?Medication Sig Dispense Refill  ? aspirin 81 MG chewable tablet Chew 1 tablet (81 mg total) by mouth daily.    ? clopidogrel (PLAVIX) 75 MG tablet TAKE 1 TABLET BY MOUTH EVERY DAY WITH BREAKFAST 90 tablet 3  ? Continuous Blood Gluc Receiver ([place]) DEVI See admin instructions.    ? fenofibrate 160 MG tablet Take 160 mg by mouth daily.     ? Glucose Blood (BAYER  CONTOUR NEXT TEST VI) glucose testing    ? insulin regular human CONCENTRATED (HUMULIN R) 500 UNIT/ML injection Inject into the skin continuous. Using insulin pump    ? isosorbide mononitrate (IMDUR) 30 MG 24 hr tablet TAKE 1 TABLET BY MOUTH EVERY DAY 90 tablet 3  ? metFORMIN (GLUCOPHAGE) 1000 MG tablet Take 1,000 mg by mouth 2 (two) times daily with a meal.    ? metoprolol succinate (TOPROL-XL) 25 MG 24 hr tablet TAKE 3 TABLETS BY MOUTH TWICE A DAY WITH OR IMMEDIATELY FOLLOWING A MEAL 540 tablet 3  ? nitroGLYCERIN (NITROSTAT) 0.4 MG SL tablet Place 1 tablet (0.4 mg total) under the tongue every 5 (five) minutes as needed. 25 tablet 2  ? Propylene Glycol (SYSTANE BALANCE) 0.6 % SOLN Place 1 drop into both eyes daily as needed (dry eyes).    ? sacubitril-valsartan (ENTRESTO) 49-51 MG Take 1 tablet by mouth 2 (two) times daily. 60 tablet 6  ? spironolactone (ALDACTONE) 25 MG tablet Take 1 tablet (25 mg total) by mouth daily. 90 tablet 3  ? torsemide (DEMADEX) 20 MG tablet Take 2 tablets (40 mg total) by mouth 2 (two) times daily. 120 tablet 5  ? TRESIBA FLEXTOUCH 200 UNIT/ML FlexTouch Pen Inject 36 Units into the skin daily.    ? VASCEPA 1 g capsule TAKE 2 CAPSULES BY MOUTH TWICE A DAY 360 capsule 4  ? ?No current facility-administered medications for this visit.  ? ? ?REVIEW OF SYSTEMS:  ? ?'[X]'$  denotes positive finding, '[ ]'$  denotes negative finding ?Cardiac  Comments:  ?Chest pain or chest pressure:    ?Shortness of breath upon exertion:    ?Short of breath when lying flat:    ?Irregular heart rhythm:    ?    ?Vascular    ?Pain in calf, thigh, or hip brought on by ambulation: x   ?Pain in feet at night that wakes you up from your sleep:     ?Blood clot in your veins:    ?Leg swelling:     ?    ?Pulmonary    ?Oxygen at home:    ?Productive cough:     ?Wheezing:     ?    ?Neurologic    ?Sudden weakness in arms or legs:     ?Sudden numbness in arms or legs:     ?Sudden onset of difficulty speaking or slurred speech:     ?Temporary loss of vision in one eye:     ?Problems with dizziness:     ?    ?Gastrointestinal    ?Blood in stool:     ? ?  Vomited blood:     ?    ?Genitourinary    ?Burning when urinating:     ?Blood in urine:    ?    ?Psychiatric    ?Major depression:     ?    ?Hematologic    ?Bleeding problems:    ?Problems with blood clotting too easily:    ?    ?Skin    ?Rashes or ulcers:    ?    ?Constitutional    ?Fever or chills:    ? ?PHYSICAL EXAM:  ? ?Vitals:  ? 07/14/21 0830  ?BP: 128/77  ?Pulse: 86  ?Resp: 20  ?Temp: 98.4 ?F (36.9 ?C)  ?SpO2: 94%  ?Weight: 234 lb (106.1 kg)  ?Height: '5\' 10"'$  (1.778 m)  ? ? ?GENERAL: The patient is a well-nourished male, in no acute distress. The vital signs are documented above. ?CARDIAC: There is a regular rate and rhythm.  ?VASCULAR: Femoral pulses, nonpalpable pedal pulses.  Bilateral lower extremity edema ?PULMONARY: Nonlabored respirations ?ABDOMEN: Soft and non-tender  ?MUSCULOSKELETAL: There are no major deformities or cyanosis. ?NEUROLOGIC: No focal weakness or paresthesias are detected. ?SKIN: There are no ulcers or rashes noted. ?PSYCHIATRIC: The patient has a normal affect. ? ?STUDIES:  ? ?I have reviewed the following: ?Duplex: ?1. Long segment occlusion through the left popliteal, anterior ?tibial and proximal posterior tibial arteries. ?2. Collateral reconstitution of flow in the distal left posterior ?tibial artery ?3. No right lower extremity arterial occlusive disease identified. ? Right ABI: 1.27, triphasic right DP, toe pressure 83 ?Left ABI:  0.54moophasic pedal waveforms ? ?ASSESSMENT and PLAN  ? ?Severe left leg claudication: I am not sure if this represents an embolic process or rather just chronic arterial insufficiency.  I suspect it is the latter.  I discussed that the neck step would be to proceed with angiogram via right femoral approach.  I would consider percutaneous intervention, mechanical thrombectomy or thrombolysis, versus bypass, based off the results  of the arteriogram.  I discussed the details of each procedure as well as the risks and benefits and he wishes to proceed.  He has been scheduled for Tuesday, May 2 ? ? ?WAnnamarie Major IV, MD, FACS ?[place]

## 2021-07-14 NOTE — Progress Notes (Signed)
? ?[facility] [place] [place] ? ?Patient name: Juan [name] MRN: [id number] DOB: [birth year] Sex: male ? ? ?REQUESTING PROVIDER:  ? ? Dr. Fara Olden ? ? ?REASON FOR CONSULT:  ?  ?PAD ? ?HISTORY OF PRESENT ILLNESS:  ? ?Juan [name] is a 56 y.o. male, who is referred for evaluation of left leg pain.  He states that he first noticed this about 3 weeks ago when he was walking around [employer] the campus.  He was not able to walk very far.  And then 2 weeks ago he woke up with pain and numbness in his left leg.  He gets pain along the outside of his calf with minimal ambulation.  He is not having rest pain. ? ?He does suffer from bilateral lower extremity edema secondary to congestive heart failure which has been present since 2009.  He is a diabetic with a recent A1c of 9.2.  He also suffers from severe hypertriglyceridemia.  He is a non-smoker.  He has a history of coronary artery disease, status post stenting as well as CABG. ? ?PAST MEDICAL HISTORY  ? ? ?Past Medical History:  ?Diagnosis Date  ? Abnormal cardiovascular stress test 02/06/2013  ? Anemia   ? occasional - no problems currently(10/02/2011)  ? Bilateral renal cysts 06/16/2011  ? states no known problems  ? Blood transfusion without reported diagnosis   ? CAD (coronary artery disease), LAD 90%, 1st diag 95%, LCX 70% wth AV groove 90%, RCA 40-50% mid and 80% long distal stenosis 02/03/13 02/04/2013  ? Chest pain   ? positive Myoview stress test  ? CHF (congestive heart failure) ([place])   ? Coronary artery calcification seen on CAT scan   ? Diabetes mellitus   ? IDDM  ? Fatty liver disease, nonalcoholic 11/10/2351  ? Hyperlipidemia   ? Hypertension   ? states no dx. of HTN, takes med. to protect kidneys due to DM  ? Lateral meniscus tear 09/2011  ? left  ? Loose body in knee 09/2011  ? loose bodies left knee  ? Non Hodgkin's lymphoma ([place]) 1991  ? Pancreatitis   ? occasional - last episode 06/2011  ? S/P CABG x 4,  02/05/13 LIMA-LAD; LT. RADIAL-OM;VG-DIAG; VG-PDA 02/06/2013  ? 10/18 3/4 patent grafts (occluded SVG-->Diag), PCI/DESx 3 SVG-->RCA, normal EF  ? Splenomegaly, congestive, chronic   ? Stuffy and runny nose 10/02/2011  ? yellow drainage from nose  ? ? ? ?FAMILY HISTORY  ? ?Family History  ?Adopted: Yes  ? ? ?SOCIAL HISTORY:  ? ?Social History  ? ?Socioeconomic History  ? Marital status: Married  ?  Spouse name: [name]  ? Number of children: 1  ? Years of education: 54  ? Highest education level: Not on file  ?Occupational History  ? Occupation: Engineer, drilling   ?  Employer: [employer]  ?Tobacco Use  ? Smoking status: Never  ? Smokeless tobacco: Never  ?Vaping Use  ? Vaping Use: Never used  ?Substance and Sexual Activity  ? Alcohol use: No  ? Drug use: No  ? Sexual activity: Yes  ?Other Topics Concern  ? Not on file  ?Social History Narrative  ? Adopted, so no pertinent FH.  Married.  Lives with wife in [place].  Independent of ADLs and ambulation.  ? ?Social Determinants of Health  ? ?Financial Resource Strain: Not on file  ?Food Insecurity: Not on file  ?Transportation Needs: Not on file  ?Physical Activity: Not on file  ?Stress:  Not on file  ?Social Connections: Not on file  ?Intimate Partner Violence: Not on file  ? ? ?ALLERGIES:  ? ? ?Allergies  ?Allergen Reactions  ? Reglan [Metoclopramide] Other (See Comments)  ?  Only can tolerate in low doses, in higher doses it has the opposite effect  ? Sglt2 Inhibitors   ?  Cellulitis with Vania Rea and Wilder Glade  ? ? ?CURRENT MEDICATIONS:  ? ? ?Current Outpatient Medications  ?Medication Sig Dispense Refill  ? aspirin 81 MG chewable tablet Chew 1 tablet (81 mg total) by mouth daily.    ? clopidogrel (PLAVIX) 75 MG tablet TAKE 1 TABLET BY MOUTH EVERY DAY WITH BREAKFAST 90 tablet 3  ? Continuous Blood Gluc Receiver ([place]) DEVI See admin instructions.    ? fenofibrate 160 MG tablet Take 160 mg by mouth daily.     ? Glucose Blood (BAYER  CONTOUR NEXT TEST VI) glucose testing    ? insulin regular human CONCENTRATED (HUMULIN R) 500 UNIT/ML injection Inject into the skin continuous. Using insulin pump    ? isosorbide mononitrate (IMDUR) 30 MG 24 hr tablet TAKE 1 TABLET BY MOUTH EVERY DAY 90 tablet 3  ? metFORMIN (GLUCOPHAGE) 1000 MG tablet Take 1,000 mg by mouth 2 (two) times daily with a meal.    ? metoprolol succinate (TOPROL-XL) 25 MG 24 hr tablet TAKE 3 TABLETS BY MOUTH TWICE A DAY WITH OR IMMEDIATELY FOLLOWING A MEAL 540 tablet 3  ? nitroGLYCERIN (NITROSTAT) 0.4 MG SL tablet Place 1 tablet (0.4 mg total) under the tongue every 5 (five) minutes as needed. 25 tablet 2  ? Propylene Glycol (SYSTANE BALANCE) 0.6 % SOLN Place 1 drop into both eyes daily as needed (dry eyes).    ? sacubitril-valsartan (ENTRESTO) 49-51 MG Take 1 tablet by mouth 2 (two) times daily. 60 tablet 6  ? spironolactone (ALDACTONE) 25 MG tablet Take 1 tablet (25 mg total) by mouth daily. 90 tablet 3  ? torsemide (DEMADEX) 20 MG tablet Take 2 tablets (40 mg total) by mouth 2 (two) times daily. 120 tablet 5  ? TRESIBA FLEXTOUCH 200 UNIT/ML FlexTouch Pen Inject 36 Units into the skin daily.    ? VASCEPA 1 g capsule TAKE 2 CAPSULES BY MOUTH TWICE A DAY 360 capsule 4  ? ?No current facility-administered medications for this visit.  ? ? ?REVIEW OF SYSTEMS:  ? ?'[X]'$  denotes positive finding, '[ ]'$  denotes negative finding ?Cardiac  Comments:  ?Chest pain or chest pressure:    ?Shortness of breath upon exertion:    ?Short of breath when lying flat:    ?Irregular heart rhythm:    ?    ?Vascular    ?Pain in calf, thigh, or hip brought on by ambulation: x   ?Pain in feet at night that wakes you up from your sleep:     ?Blood clot in your veins:    ?Leg swelling:     ?    ?Pulmonary    ?Oxygen at home:    ?Productive cough:     ?Wheezing:     ?    ?Neurologic    ?Sudden weakness in arms or legs:     ?Sudden numbness in arms or legs:     ?Sudden onset of difficulty speaking or slurred speech:     ?Temporary loss of vision in one eye:     ?Problems with dizziness:     ?    ?Gastrointestinal    ?Blood in stool:     ? ?  Vomited blood:     ?    ?Genitourinary    ?Burning when urinating:     ?Blood in urine:    ?    ?Psychiatric    ?Major depression:     ?    ?Hematologic    ?Bleeding problems:    ?Problems with blood clotting too easily:    ?    ?Skin    ?Rashes or ulcers:    ?    ?Constitutional    ?Fever or chills:    ? ?PHYSICAL EXAM:  ? ?Vitals:  ? 07/14/21 0830  ?BP: 128/77  ?Pulse: 86  ?Resp: 20  ?Temp: 98.4 ?F (36.9 ?C)  ?SpO2: 94%  ?Weight: 234 lb (106.1 kg)  ?Height: '5\' 10"'$  (1.778 m)  ? ? ?GENERAL: The patient is a well-nourished male, in no acute distress. The vital signs are documented above. ?CARDIAC: There is a regular rate and rhythm.  ?VASCULAR: Femoral pulses, nonpalpable pedal pulses.  Bilateral lower extremity edema ?PULMONARY: Nonlabored respirations ?ABDOMEN: Soft and non-tender  ?MUSCULOSKELETAL: There are no major deformities or cyanosis. ?NEUROLOGIC: No focal weakness or paresthesias are detected. ?SKIN: There are no ulcers or rashes noted. ?PSYCHIATRIC: The patient has a normal affect. ? ?STUDIES:  ? ?I have reviewed the following: ?Duplex: ?1. Long segment occlusion through the left popliteal, anterior ?tibial and proximal posterior tibial arteries. ?2. Collateral reconstitution of flow in the distal left posterior ?tibial artery ?3. No right lower extremity arterial occlusive disease identified. ? Right ABI: 1.27, triphasic right DP, toe pressure 83 ?Left ABI:  0.13moophasic pedal waveforms ? ?ASSESSMENT and PLAN  ? ?Severe left leg claudication: I am not sure if this represents an embolic process or rather just chronic arterial insufficiency.  I suspect it is the latter.  I discussed that the neck step would be to proceed with angiogram via right femoral approach.  I would consider percutaneous intervention, mechanical thrombectomy or thrombolysis, versus bypass, based off the results  of the arteriogram.  I discussed the details of each procedure as well as the risks and benefits and he wishes to proceed.  He has been scheduled for Tuesday, May 2 ? ? ?WAnnamarie Major IV, MD, FACS ?[facility]

## 2021-07-14 NOTE — H&P (View-Only) (Signed)
? ?[facility] ? ?Patient name: Juan [name] MRN: [id number] DOB: 1965/12/16 Sex: male ? ? ?REQUESTING PROVIDER:  ? ? Dr. Fara Olden ? ? ?REASON FOR CONSULT:  ?  ?PAD ? ?HISTORY OF PRESENT ILLNESS:  ? ?Juan [name] is a 56 y.o. male, who is referred for evaluation of left leg pain.  He states that he first noticed this about 3 weeks ago when he was walking around [employer] the campus.  He was not able to walk very far.  And then 2 weeks ago he woke up with pain and numbness in his left leg.  He gets pain along the outside of his calf with minimal ambulation.  He is not having rest pain. ? ?He does suffer from bilateral lower extremity edema secondary to congestive heart failure which has been present since 2009.  He is a diabetic with a recent A1c of 9.2.  He also suffers from severe hypertriglyceridemia.  He is a non-smoker.  He has a history of coronary artery disease, status post stenting as well as CABG. ? ?PAST MEDICAL HISTORY  ? ? ?Past Medical History:  ?Diagnosis Date  ? Abnormal cardiovascular stress test 02/06/2013  ? Anemia   ? occasional - no problems currently(10/02/2011)  ? Bilateral renal cysts 06/16/2011  ? states no known problems  ? Blood transfusion without reported diagnosis   ? CAD (coronary artery disease), LAD 90%, 1st diag 95%, LCX 70% wth AV groove 90%, RCA 40-50% mid and 80% long distal stenosis 02/03/13 02/04/2013  ? Chest pain   ? positive Myoview stress test  ? CHF (congestive heart failure) ([place])   ? Coronary artery calcification seen on CAT scan   ? Diabetes mellitus   ? IDDM  ? Fatty liver disease, nonalcoholic 8/34/1962  ? Hyperlipidemia   ? Hypertension   ? states no dx. of HTN, takes med. to protect kidneys due to DM  ? Lateral meniscus tear 09/2011  ? left  ? Loose body in knee 09/2011  ? loose bodies left knee  ? Non Hodgkin's lymphoma ([place]) 1991  ? Pancreatitis   ? occasional - last episode 06/2011  ? S/P CABG x 4,  02/05/13 LIMA-LAD; LT. RADIAL-OM;VG-DIAG; VG-PDA 02/06/2013  ? 10/18 3/4 patent grafts (occluded SVG-->Diag), PCI/DESx 3 SVG-->RCA, normal EF  ? Splenomegaly, congestive, chronic   ? Stuffy and runny nose 10/02/2011  ? yellow drainage from nose  ? ? ? ?FAMILY HISTORY  ? ?Family History  ?Adopted: Yes  ? ? ?SOCIAL HISTORY:  ? ?Social History  ? ?Socioeconomic History  ? Marital status: Married  ?  Spouse name: [name]  ? Number of children: 1  ? Years of education: 3  ? Highest education level: Not on file  ?Occupational History  ? Occupation: Engineer, drilling   ?  Employer: [employer]  ?Tobacco Use  ? Smoking status: Never  ? Smokeless tobacco: Never  ?Vaping Use  ? Vaping Use: Never used  ?Substance and Sexual Activity  ? Alcohol use: No  ? Drug use: No  ? Sexual activity: Yes  ?Other Topics Concern  ? Not on file  ?Social History Narrative  ? Adopted, so no pertinent FH.  Married.  Lives with wife in [place].  Independent of ADLs and ambulation.  ? ?Social Determinants of Health  ? ?Financial Resource Strain: Not on file  ?Food Insecurity: Not on file  ?Transportation Needs: Not on file  ?Physical Activity: Not on file  ?Stress:  Not on file  ?Social Connections: Not on file  ?Intimate Partner Violence: Not on file  ? ? ?ALLERGIES:  ? ? ?Allergies  ?Allergen Reactions  ? Reglan [Metoclopramide] Other (See Comments)  ?  Only can tolerate in low doses, in higher doses it has the opposite effect  ? Sglt2 Inhibitors   ?  Cellulitis with Vania Rea and Wilder Glade  ? ? ?CURRENT MEDICATIONS:  ? ? ?Current Outpatient Medications  ?Medication Sig Dispense Refill  ? aspirin 81 MG chewable tablet Chew 1 tablet (81 mg total) by mouth daily.    ? clopidogrel (PLAVIX) 75 MG tablet TAKE 1 TABLET BY MOUTH EVERY DAY WITH BREAKFAST 90 tablet 3  ? Continuous Blood Gluc Receiver ([place]) DEVI See admin instructions.    ? fenofibrate 160 MG tablet Take 160 mg by mouth daily.     ? Glucose Blood (BAYER  CONTOUR NEXT TEST VI) glucose testing    ? insulin regular human CONCENTRATED (HUMULIN R) 500 UNIT/ML injection Inject into the skin continuous. Using insulin pump    ? isosorbide mononitrate (IMDUR) 30 MG 24 hr tablet TAKE 1 TABLET BY MOUTH EVERY DAY 90 tablet 3  ? metFORMIN (GLUCOPHAGE) 1000 MG tablet Take 1,000 mg by mouth 2 (two) times daily with a meal.    ? metoprolol succinate (TOPROL-XL) 25 MG 24 hr tablet TAKE 3 TABLETS BY MOUTH TWICE A DAY WITH OR IMMEDIATELY FOLLOWING A MEAL 540 tablet 3  ? nitroGLYCERIN (NITROSTAT) 0.4 MG SL tablet Place 1 tablet (0.4 mg total) under the tongue every 5 (five) minutes as needed. 25 tablet 2  ? Propylene Glycol (SYSTANE BALANCE) 0.6 % SOLN Place 1 drop into both eyes daily as needed (dry eyes).    ? sacubitril-valsartan (ENTRESTO) 49-51 MG Take 1 tablet by mouth 2 (two) times daily. 60 tablet 6  ? spironolactone (ALDACTONE) 25 MG tablet Take 1 tablet (25 mg total) by mouth daily. 90 tablet 3  ? torsemide (DEMADEX) 20 MG tablet Take 2 tablets (40 mg total) by mouth 2 (two) times daily. 120 tablet 5  ? TRESIBA FLEXTOUCH 200 UNIT/ML FlexTouch Pen Inject 36 Units into the skin daily.    ? VASCEPA 1 g capsule TAKE 2 CAPSULES BY MOUTH TWICE A DAY 360 capsule 4  ? ?No current facility-administered medications for this visit.  ? ? ?REVIEW OF SYSTEMS:  ? ?'[X]'$  denotes positive finding, '[ ]'$  denotes negative finding ?Cardiac  Comments:  ?Chest pain or chest pressure:    ?Shortness of breath upon exertion:    ?Short of breath when lying flat:    ?Irregular heart rhythm:    ?    ?Vascular    ?Pain in calf, thigh, or hip brought on by ambulation: x   ?Pain in feet at night that wakes you up from your sleep:     ?Blood clot in your veins:    ?Leg swelling:     ?    ?Pulmonary    ?Oxygen at home:    ?Productive cough:     ?Wheezing:     ?    ?Neurologic    ?Sudden weakness in arms or legs:     ?Sudden numbness in arms or legs:     ?Sudden onset of difficulty speaking or slurred speech:     ?Temporary loss of vision in one eye:     ?Problems with dizziness:     ?    ?Gastrointestinal    ?Blood in stool:     ? ?  Vomited blood:     ?    ?Genitourinary    ?Burning when urinating:     ?Blood in urine:    ?    ?Psychiatric    ?Major depression:     ?    ?Hematologic    ?Bleeding problems:    ?Problems with blood clotting too easily:    ?    ?Skin    ?Rashes or ulcers:    ?    ?Constitutional    ?Fever or chills:    ? ?PHYSICAL EXAM:  ? ?Vitals:  ? 07/14/21 0830  ?BP: 128/77  ?Pulse: 86  ?Resp: 20  ?Temp: 98.4 ?F (36.9 ?C)  ?SpO2: 94%  ?Weight: 234 lb (106.1 kg)  ?Height: '5\' 10"'$  (1.778 m)  ? ? ?GENERAL: The patient is a well-nourished male, in no acute distress. The vital signs are documented above. ?CARDIAC: There is a regular rate and rhythm.  ?VASCULAR: Femoral pulses, nonpalpable pedal pulses.  Bilateral lower extremity edema ?PULMONARY: Nonlabored respirations ?ABDOMEN: Soft and non-tender  ?MUSCULOSKELETAL: There are no major deformities or cyanosis. ?NEUROLOGIC: No focal weakness or paresthesias are detected. ?SKIN: There are no ulcers or rashes noted. ?PSYCHIATRIC: The patient has a normal affect. ? ?STUDIES:  ? ?I have reviewed the following: ?Duplex: ?1. Long segment occlusion through the left popliteal, anterior ?tibial and proximal posterior tibial arteries. ?2. Collateral reconstitution of flow in the distal left posterior ?tibial artery ?3. No right lower extremity arterial occlusive disease identified. ? Right ABI: 1.27, triphasic right DP, toe pressure 83 ?Left ABI:  0.62moophasic pedal waveforms ? ?ASSESSMENT and PLAN  ? ?Severe left leg claudication: I am not sure if this represents an embolic process or rather just chronic arterial insufficiency.  I suspect it is the latter.  I discussed that the neck step would be to proceed with angiogram via right femoral approach.  I would consider percutaneous intervention, mechanical thrombectomy or thrombolysis, versus bypass, based off the results  of the arteriogram.  I discussed the details of each procedure as well as the risks and benefits and he wishes to proceed.  He has been scheduled for Tuesday, May 2 ? ? ?WAnnamarie Major IV, MD, FACS ?[facility]

## 2021-07-18 ENCOUNTER — Other Ambulatory Visit: Payer: Self-pay

## 2021-07-18 ENCOUNTER — Encounter (HOSPITAL_COMMUNITY)
Admission: RE | Disposition: A | Discharge status: Home / Self Care | Payer: Self-pay | Source: Home / Self Care | Attending: Surgery

## 2021-07-18 ENCOUNTER — Ambulatory Visit (HOSPITAL_COMMUNITY)
Admission: RE | Admit: 2021-07-18 | Discharge: 2021-07-18 | Disposition: A | Discharge status: Home / Self Care | Payer: Commercial Managed Care - PPO | Attending: Surgery | Admitting: Surgery

## 2021-07-18 ENCOUNTER — Ambulatory Visit (HOSPITAL_COMMUNITY): Payer: Commercial Managed Care - PPO

## 2021-07-18 DIAGNOSIS — I2510 Atherosclerotic heart disease of native coronary artery without angina pectoris: Secondary | ICD-10-CM | POA: Insufficient documentation

## 2021-07-18 DIAGNOSIS — Z955 Presence of coronary angioplasty implant and graft: Secondary | ICD-10-CM | POA: Diagnosis not present

## 2021-07-18 DIAGNOSIS — E781 Pure hyperglyceridemia: Secondary | ICD-10-CM | POA: Insufficient documentation

## 2021-07-18 DIAGNOSIS — I70212 Atherosclerosis of native arteries of extremities with intermittent claudication, left leg: Secondary | ICD-10-CM | POA: Insufficient documentation

## 2021-07-18 DIAGNOSIS — I110 Hypertensive heart disease with heart failure: Secondary | ICD-10-CM | POA: Insufficient documentation

## 2021-07-18 DIAGNOSIS — Z923 Personal history of irradiation: Secondary | ICD-10-CM | POA: Diagnosis not present

## 2021-07-18 DIAGNOSIS — Z794 Long term (current) use of insulin: Secondary | ICD-10-CM | POA: Insufficient documentation

## 2021-07-18 DIAGNOSIS — E1151 Type 2 diabetes mellitus with diabetic peripheral angiopathy without gangrene: Secondary | ICD-10-CM | POA: Insufficient documentation

## 2021-07-18 DIAGNOSIS — Z951 Presence of aortocoronary bypass graft: Secondary | ICD-10-CM | POA: Diagnosis not present

## 2021-07-18 DIAGNOSIS — R609 Edema, unspecified: Secondary | ICD-10-CM | POA: Insufficient documentation

## 2021-07-18 DIAGNOSIS — I509 Heart failure, unspecified: Secondary | ICD-10-CM | POA: Insufficient documentation

## 2021-07-18 DIAGNOSIS — Z9641 Presence of insulin pump (external) (internal): Secondary | ICD-10-CM | POA: Diagnosis not present

## 2021-07-18 DIAGNOSIS — Z7984 Long term (current) use of oral hypoglycemic drugs: Secondary | ICD-10-CM | POA: Diagnosis not present

## 2021-07-18 DIAGNOSIS — Z8572 Personal history of non-Hodgkin lymphomas: Secondary | ICD-10-CM | POA: Insufficient documentation

## 2021-07-18 HISTORY — PX: ABDOMINAL AORTOGRAM W/LOWER EXTREMITY: CATH118223

## 2021-07-18 LAB — GLUCOSE, CAPILLARY
Glucose-Capillary: 157 mg/dL — ABNORMAL HIGH (ref 70–99)
Glucose-Capillary: 174 mg/dL — ABNORMAL HIGH (ref 70–99)

## 2021-07-18 LAB — POCT I-STAT, CHEM 8
BUN: 50 mg/dL — ABNORMAL HIGH (ref 6–20)
Calcium, Ion: 1.33 mmol/L (ref 1.15–1.40)
Chloride: 105 mmol/L (ref 98–111)
Creatinine, Ser: 1.5 mg/dL — ABNORMAL HIGH (ref 0.61–1.24)
Glucose, Bld: 187 mg/dL — ABNORMAL HIGH (ref 70–99)
HCT: 39 % (ref 39.0–52.0)
Hemoglobin: 13.3 g/dL (ref 13.0–17.0)
Potassium: 4.5 mmol/L (ref 3.5–5.1)
Sodium: 137 mmol/L (ref 135–145)
TCO2: 27 mmol/L (ref 22–32)

## 2021-07-18 SURGERY — ABDOMINAL AORTOGRAM W/LOWER EXTREMITY
Anesthesia: LOCAL

## 2021-07-18 MED ORDER — FENTANYL CITRATE (PF) 100 MCG/2ML IJ SOLN
INTRAMUSCULAR | Status: DC | PRN
  Administered 2021-07-18: 50 ug via INTRAVENOUS

## 2021-07-18 MED ORDER — SODIUM CHLORIDE 0.9% FLUSH: 3.0000 mL | Freq: Two times a day (BID) | INTRAVENOUS | Status: DC

## 2021-07-18 MED ORDER — HEPARIN (PORCINE) IN NACL 1000-0.9 UT/500ML-% IV SOLN
INTRAVENOUS | Status: AC
  Filled 2021-07-18: qty 1000

## 2021-07-18 MED ORDER — FENTANYL CITRATE (PF) 100 MCG/2ML IJ SOLN
INTRAMUSCULAR | Status: AC
  Filled 2021-07-18: qty 2

## 2021-07-18 MED ORDER — SODIUM CHLORIDE 0.9% FLUSH: 3.0000 mL | INTRAVENOUS | Status: DC | PRN

## 2021-07-18 MED ORDER — HEPARIN (PORCINE) IN NACL 1000-0.9 UT/500ML-% IV SOLN
INTRAVENOUS | Status: DC | PRN
  Administered 2021-07-18 (×2): 500 mL

## 2021-07-18 MED ORDER — OXYCODONE HCL 5 MG PO TABS: 5.0000 mg | ORAL_TABLET | ORAL | Status: DC | PRN

## 2021-07-18 MED ORDER — ONDANSETRON HCL 4 MG/2ML IJ SOLN
4.0000 mg | Freq: Four times a day (QID) | INTRAMUSCULAR | Status: DC | PRN
Start: 2021-07-18 — End: 2021-07-19

## 2021-07-18 MED ORDER — SODIUM CHLORIDE 0.9 % IV SOLN: 250.0000 mL | INTRAVENOUS | Status: DC | PRN

## 2021-07-18 MED ORDER — LIDOCAINE HCL (PF) 1 % IJ SOLN
INTRAMUSCULAR | Status: AC
  Filled 2021-07-18: qty 30

## 2021-07-18 MED ORDER — MIDAZOLAM HCL 2 MG/2ML IJ SOLN
INTRAMUSCULAR | Status: AC
  Filled 2021-07-18: qty 2

## 2021-07-18 MED ORDER — LIDOCAINE HCL (PF) 1 % IJ SOLN
INTRAMUSCULAR | Status: DC | PRN
  Administered 2021-07-18: 12 mL

## 2021-07-18 MED ORDER — MORPHINE SULFATE (PF) 2 MG/ML IV SOLN: 2.0000 mg | INTRAVENOUS | Status: DC | PRN

## 2021-07-18 MED ORDER — MIDAZOLAM HCL 2 MG/2ML IJ SOLN
INTRAMUSCULAR | Status: DC | PRN
  Administered 2021-07-18: 2 mg via INTRAVENOUS

## 2021-07-18 MED ORDER — SODIUM CHLORIDE 0.9 % IV SOLN: INTRAVENOUS | Status: DC

## 2021-07-18 MED ORDER — ACETAMINOPHEN 325 MG PO TABS: 650.0000 mg | ORAL_TABLET | ORAL | Status: DC | PRN

## 2021-07-18 MED ORDER — LABETALOL HCL 5 MG/ML IV SOLN: 10.0000 mg | INTRAVENOUS | Status: DC | PRN

## 2021-07-18 MED ORDER — SODIUM CHLORIDE 0.9 % WEIGHT BASED INFUSION: 1.0000 mL/kg/h | INTRAVENOUS | Status: DC

## 2021-07-18 MED ORDER — HYDRALAZINE HCL 20 MG/ML IJ SOLN: 5.0000 mg | INTRAMUSCULAR | Status: DC | PRN

## 2021-07-18 MED ORDER — IODIXANOL 320 MG/ML IV SOLN
INTRAVENOUS | Status: DC | PRN
  Administered 2021-07-18: 115 mL

## 2021-07-18 SURGICAL SUPPLY — 10 items
CATH OMNI FLUSH 5F 65CM (CATHETERS) ×1 IMPLANT
DEVICE VASC CLSR CELT ART 5 (Vascular Products) ×1 IMPLANT
KIT MICROPUNCTURE NIT STIFF (SHEATH) ×1 IMPLANT
KIT PV (KITS) ×2 IMPLANT
PROTECTION STATION PRESSURIZED (MISCELLANEOUS) ×2
SHEATH PINNACLE 5F 10CM (SHEATH) ×1 IMPLANT
STATION PROTECTION PRESSURIZED (MISCELLANEOUS) IMPLANT
TRANSDUCER W/STOPCOCK (MISCELLANEOUS) ×2 IMPLANT
TRAY PV CATH (CUSTOM PROCEDURE TRAY) ×2 IMPLANT
WIRE BENTSON .035X145CM (WIRE) ×1 IMPLANT

## 2021-07-18 NOTE — Op Note (Signed)
? ? ?  Patient name: Juan [name] MRN: [id number] DOB: 1965/09/03 Sex: male ? ?07/18/2021 ?Pre-operative Diagnosis: Severe left leg claudication ?Post-operative diagnosis:  Same ?Surgeon:  Annamarie Major ?Procedure Performed: ? 1.  Ultrasound-guided access, right femoral artery ? 2.  Abdominal aortogram ? 3.  Bilateral lower extremity runoff ? 4.  Second-order catheterization ? 5.  Conscious sedation, 27 minutes ? 6.  Closure device, Celt ?  ? ?Indications: This is a 56 year old gentleman with acute onset of left leg severe claudication.  He comes in today for angiography. ? ?Procedure:  The patient was identified in the holding area and taken to room 8.  The patient was then placed supine on the table and prepped and draped in the usual sterile fashion.  A time out was called.  Conscious sedation was administered with the use of IV fentanyl and Versed under continuous physician and nurse monitoring.  Heart rate, blood pressure, and oxygen saturation were continuously monitored.  Total sedation time was 27 minutes.  Ultrasound was used to evaluate the right common femoral artery.  It was patent .  A digital ultrasound image was acquired.  A micropuncture needle was used to access the right common femoral artery under ultrasound guidance.  An 018 wire was advanced without resistance and a micropuncture sheath was placed.  The 018 wire was removed and a benson wire was placed.  The micropuncture sheath was exchanged for a 5 french sheath.  An omniflush catheter was advanced over the wire to the level of L-1.  An abdominal angiogram was obtained.  Next, using the omniflush catheter and a benson wire, the aortic bifurcation was crossed and the catheter was placed into theleft external iliac artery and left runoff was obtained.  right runoff was performed via retrograde sheath injections. ? ?Findings:  ? Aortogram: No significant renal artery stenosis was visualized.  The infrarenal abdominal aorta is widely patent.   Bilateral common extrailiac arteries widely patent. ? Right Lower Extremity: Right common femoral, profundofemoral, and superficial femoral artery are widely patent.  The popliteal artery is widely patent.  There is two-vessel runoff via the peroneal and posterior tibial artery. ? Left Lower Extremity: Left common femoral, profundofemoral, and superficial femoral artery are widely patent.  The popliteal artery beginning at the adductor canal is chronically occluded.  There is reconstitution of the below-knee popliteal artery with two-vessel runoff via the posterior tibial and peroneal artery. ? ?Intervention: None.  The groin was closed with a Celt ? ?Impression: ? #1  Left popliteal artery occlusion with two-vessel runoff via the posterior tibial and peroneal ? #2  I discussed with the patient proceeding with a left femoral to below-knee popliteal artery bypass graft, versus an above-knee to below-knee popliteal artery bypass graft.  He does have a history of lymphoma with radiation to his left knee which may be contributing factor and may impact exposure of the vessels.  His operation will be scheduled for next week after he has been off of his Plavix. ?\ ? ? ?V. Annamarie Major, M.D., FACS ?[facility] ?Office: [phone] ?Pager:  [phone]  ?

## 2021-07-18 NOTE — Interval H&P Note (Signed)
History and Physical Interval Note: ? ?07/18/2021 ?1:51 PM ? ?Juan [name]  has presented today for surgery, with the diagnosis of severe claudication.  The various methods of treatment have been discussed with the patient and family. After consideration of risks, benefits and other options for treatment, the patient has consented to  Procedure(s): ?ABDOMINAL AORTOGRAM W/LOWER EXTREMITY (N/A) as a surgical intervention.  The patient's history has been reviewed, patient examined, no change in status, stable for surgery.  I have reviewed the patient's chart and labs.  Questions were answered to the patient's satisfaction.   ? ? ?Juan [name] ? ? ?

## 2021-07-18 NOTE — Progress Notes (Signed)
Lower extremity vein mapping has been completed.  ? ?Preliminary results in CV Proc.  ? ?Juan [name] ?07/18/2021 5:23 PM    ?

## 2021-07-19 ENCOUNTER — Encounter (HOSPITAL_COMMUNITY): Payer: Self-pay | Admitting: Surgery

## 2021-07-19 ENCOUNTER — Other Ambulatory Visit (HOSPITAL_COMMUNITY): Payer: Self-pay | Admitting: Internal Medicine

## 2021-07-21 ENCOUNTER — Other Ambulatory Visit: Payer: Self-pay

## 2021-07-21 DIAGNOSIS — I70212 Atherosclerosis of native arteries of extremities with intermittent claudication, left leg: Secondary | ICD-10-CM

## 2021-07-25 DIAGNOSIS — Z0279 Encounter for issue of other medical certificate: Secondary | ICD-10-CM

## 2021-07-26 ENCOUNTER — Other Ambulatory Visit (HOSPITAL_COMMUNITY): Payer: Self-pay | Admitting: Endocrinology

## 2021-07-26 ENCOUNTER — Other Ambulatory Visit: Payer: Self-pay | Admitting: Endocrinology

## 2021-07-27 ENCOUNTER — Other Ambulatory Visit: Payer: Self-pay

## 2021-07-27 ENCOUNTER — Encounter (HOSPITAL_COMMUNITY): Payer: Self-pay | Admitting: Surgery

## 2021-07-27 NOTE — Progress Notes (Signed)
Anesthesia Chart Review: ? ?Pt is a same day work up  ? ? ? Case: [id number] Date/Time: 07/28/21 0842  ? Procedure: LEFT FEMORAL-BELOW KNEE POPLITEAL ARTERY BYPASS GRAFT (Left)  ? Anesthesia type: General  ? Pre-op diagnosis: Atherosclerosis of native artery of left lower extremity with intermittent claudication  ? Location: [facility] ROOM 11 / [facility]  ? Surgeons: Serafina Mitchell, MD  ? ?  ? ? ?DISCUSSION: ?Pt is 56 years old with hx:  ?- CAD (s/p CABG 2014; s/p stent to SVG-RCA 2018; severe 3 vessel and all graft disease on 2021 cath; no targets for intervention and not a redo CABG candidate) ?- CHF (EF recovered to 50%) ?- HTN ?- DM (notes indicate it is poorly controlled, attempting to get records from PCP office) ?- OSA ?- Non Hodgkin's lymphoma ?- NAFLD ?- Anemia ? ?Pt to stop plavix 5 days before surgery ? ? ?Reviewed cardiac history with Dr. Glennon Mac.  ? ? ?PROVIDERS: ?- PCP is Kathalene Frames, MD ?- Cardiologist is Quay Burow, MD ?- HF cardiologist is Glori Bickers, MD. Last office visit 05/22/21 with Allena Katz, NP ? ? ?LABS: Will be obtained day of surgery  ? ? ?EKG 05/08/21: Sinus rhythm with 1st degree A-V block. LAD. RBBB and left anterior fascicular block. T wave abnormality, consider anterolateral ischemia ? ? ?CV: ?Echo 05/22/21:  ?1. Left ventricular ejection fraction by 3D volume is 50%. The left ventricle has low normal function. The left ventricle demonstrates regional wall motion abnormalities (see scoring diagram/findings for description). There is mild concentric left ventricular hypertrophy. Indeterminate diastolic filling due to E-A fusion. Elevated left atrial pressure.  ?2. Right ventricular systolic function is mildly reduced. The right ventricular size is normal.  ?3. The mitral valve is normal in structure. Trivial mitral valve regurgitation. No evidence of mitral stenosis.  ?4. The aortic valve is normal in structure. Aortic valve regurgitation is trivial. No aortic stenosis is  present.  ?5. There is mild dilatation of the aortic root, measuring 37 mm. There is borderline dilatation of the ascending aorta, measuring 36 mm.  ?6. The inferior vena cava is normal in size with greater than 50% respiratory variability, suggesting right atrial pressure of 3 mmHg.  ? ?Cardiac cath 02/15/20:  ?Prox RCA lesion is 95% stenosed. ?Mid RCA lesion is 100% stenosed. ?Ost LM to Mid LM lesion is 40% stenosed. ?Prox Cx to Mid Cx lesion is 100% stenosed. ?Mid LAD lesion is 100% stenosed. ?1st Diag lesion is 90% stenosed. ?Origin lesion is 100% stenosed. ?Origin to Prox Graft lesion is 100% stenosed. ?Dist Cx lesion is 100% stenosed. ?- All vein grafts are functionally occluded with minimal left to right collaterals.  His LIMA is patent although his LAD is a diabetic diffusely diseased vessel ? ? ? ?Past Medical History:  ?Diagnosis Date  ? Abnormal cardiovascular stress test 02/06/2013  ? Anemia   ? occasional - no problems currently(10/02/2011)  ? Bilateral renal cysts 06/16/2011  ? states no known problems  ? Blood transfusion without reported diagnosis   ? CAD (coronary artery disease), LAD 90%, 1st diag 95%, LCX 70% wth AV groove 90%, RCA 40-50% mid and 80% long distal stenosis 02/03/13 02/04/2013  ? Chest pain   ? positive Myoview stress test  ? CHF (congestive heart failure) ([place])   ? Coronary artery calcification seen on CAT scan   ? Diabetes mellitus   ? IDDM  ? Fatty liver disease, nonalcoholic 32/67/1245  ? Hyperlipidemia   ?  Hypertension   ? states no dx. of HTN, takes med. to protect kidneys due to DM  ? Lateral meniscus tear 09/2011  ? left  ? Loose body in knee 09/2011  ? loose bodies left knee  ? Non Hodgkin's lymphoma ([place]) 1991  ? Pancreatitis   ? occasional - last episode 06/2011  ? Pneumonia   ? S/P CABG x 4, 02/05/13 LIMA-LAD; LT. RADIAL-OM;VG-DIAG; VG-PDA 02/06/2013  ? 10/18 3/4 patent grafts (occluded SVG-->Diag), PCI/DESx 3 SVG-->RCA, normal EF  ? Sleep apnea   ? mild per patient  ?  Splenomegaly, congestive, chronic   ? Stuffy and runny nose 10/02/2011  ? yellow drainage from nose  ? ? ?Past Surgical History:  ?Procedure Laterality Date  ? ABDOMINAL AORTIC ANEURYSM REPAIR    ? ABDOMINAL AORTOGRAM W/LOWER EXTREMITY N/A 07/18/2021  ? Procedure: ABDOMINAL AORTOGRAM W/LOWER EXTREMITY;  Surgeon: Serafina Mitchell, MD;  Location: [facility];  Service: Cardiovascular;  Laterality: N/A;  ? ANTERIOR CERVICAL DECOMP/DISCECTOMY FUSION N/A 03/26/2018  ? Procedure: ANTERIOR CERVICAL DECOMPRESSION FUSION CERVICAL 5-6 WITH INSTRUMENTATION AND ALLOGRAFT;  Surgeon: Phylliss Bob, MD;  Location: [place];  Service: Orthopedics;  Laterality: N/A;  ? CARDIAC CATHETERIZATION    ? CORONARY ARTERY BYPASS GRAFT N/A 02/05/2013  ? Procedure: CORONARY ARTERY BYPASS GRAFTING (CABG);  Surgeon: Ivin Poot, MD;  Location: [place];  Service: Open Heart Surgery;  Laterality: N/A;  Coronary artery bypass graft on pump times four using left internal mammary artery and right greater saphenous vein via endovein harvest and left radial artery harvest.   ? CORONARY STENT INTERVENTION  12/17/2016  ?  PCI and drug-eluting stenting of the mid and distal RCA SVG   ? CORONARY STENT INTERVENTION N/A 12/17/2016  ? Procedure: CORONARY STENT INTERVENTION;  Surgeon: Lorretta Harp, MD;  Location: [facility];  Service: Cardiovascular;  Laterality: N/A;  ? ELBOW SURGERY    ? HERNIA REPAIR    ? inguinal   ? herniated disc    ? INTRAOPERATIVE TRANSESOPHAGEAL ECHOCARDIOGRAM N/A 02/05/2013  ? Procedure: INTRAOPERATIVE TRANSESOPHAGEAL ECHOCARDIOGRAM;  Surgeon: Ivin Poot, MD;  Location: [facility];  Service: Open Heart Surgery;  Laterality: N/A;  ? KNEE ARTHROSCOPY  10/09/2011  ? Procedure: ARTHROSCOPY KNEE;  Surgeon: Nita Sells, MD;  Location: [place];  Service: Orthopedics;  Laterality: Left;  ? LEFT HEART CATH AND CORS/GRAFTS ANGIOGRAPHY N/A 12/17/2016  ? Procedure: LEFT HEART CATH AND CORS/GRAFTS  ANGIOGRAPHY;  Surgeon: Lorretta Harp, MD;  Location: [facility] [facility];  Service: Cardiovascular;  Laterality: N/A;  ? LEFT HEART CATHETERIZATION WITH CORONARY ANGIOGRAM N/A 02/03/2013  ? Procedure: LEFT HEART CATHETERIZATION WITH CORONARY ANGIOGRAM;  Surgeon: Lorretta Harp, MD;  Location: [facility];  Service: Cardiovascular;  Laterality: N/A;  ? NASAL SEPTUM SURGERY    ? RADIAL ARTERY HARVEST Left 02/05/2013  ? Procedure: RADIAL ARTERY HARVEST;  Surgeon: Ivin Poot, MD;  Location: [place];  Service: Vascular;  Laterality: Left;  ? RIGHT/LEFT HEART CATH AND CORONARY/GRAFT ANGIOGRAPHY N/A 02/15/2020  ? Procedure: RIGHT/LEFT HEART CATH AND CORONARY/GRAFT ANGIOGRAPHY;  Surgeon: Lorretta Harp, MD;  Location: [facility];  Service: Cardiovascular;  Laterality: N/A;  ? TEE WITHOUT CARDIOVERSION N/A 02/18/2020  ? Procedure: TRANSESOPHAGEAL ECHOCARDIOGRAM (TEE);  Surgeon: Jolaine Artist, MD;  Location: [facility] ENDOSCOPY;  Service: Cardiovascular;  Laterality: N/A;  ? TIBIA BONE BIOPSY  x 3  ? left  ? TONSILLECTOMY    ? ? ?  MEDICATIONS: ?No current facility-administered medications for this encounter.  ? ? aspirin 81 MG chewable tablet  ? clopidogrel (PLAVIX) 75 MG tablet  ? Continuous Blood Gluc Receiver ([place]) DEVI  ? fenofibrate 160 MG tablet  ? Glucose Blood (BAYER CONTOUR NEXT TEST VI)  ? insulin regular human CONCENTRATED (HUMULIN R) 500 UNIT/ML injection  ? isosorbide mononitrate (IMDUR) 30 MG 24 hr tablet  ? metFORMIN (GLUCOPHAGE) 1000 MG tablet  ? metoprolol succinate (TOPROL-XL) 25 MG 24 hr tablet  ? nitroGLYCERIN (NITROSTAT) 0.4 MG SL tablet  ? Propylene Glycol (SYSTANE BALANCE) 0.6 % SOLN  ? sacubitril-valsartan (ENTRESTO) 49-51 MG  ? spironolactone (ALDACTONE) 25 MG tablet  ? torsemide (DEMADEX) 20 MG tablet  ? TRESIBA FLEXTOUCH 200 UNIT/ML FlexTouch Pen  ? VASCEPA 1 g capsule  ? ?- Pt to stop plavix 5 days before surgery ? ?If labs acceptable day of surgery and no acute  changes, I anticipate pt can proceed with surgery as scheduled. ? ?Willeen Cass, PhD, FNP-BC ?[facility]/Anesthesiology ?Phone: [phone] ?07/27/2021 3:29 PM ? ? ? ? ? ? ? ?

## 2021-07-27 NOTE — Progress Notes (Addendum)
PCP - Leeroy Cha, MD ?Cardiologist - Quay Burow, MD ? ?PPM/ICD - denies ?Device Orders - n/a ?Rep Notified - n/a ? ?Chest x-ray - 05/21/2019 ?EKG - 05/08/2021 ?Stress Test - 01/29/2020 ?ECHO - 05/22/2021 ?Cardiac Cath - 02/15/2020 ? ?CPAP - no ? ?Fasting Blood Sugar - 100 - 200 ?Checks Blood Sugar 3-4/day ? ?Blood Thinner Instructions: Plavix - last dose 07/21/2021 per patient ?Aspirin Instructions: patient will continue Aspirin even the day of surgery per patient ?Patient was instructed: As of today, STOP taking any Aspirin (unless otherwise instructed by your surgeon) Aleve, Naproxen, Ibuprofen, Motrin, Advil, Goody's, BC's, all herbal medications, fish oil, and all vitamins. ? ?ERAS Protcol - n/a ? ?COVID TEST- n/a. Patient denies being in contact with somebody tested positive for COVID or with symptoms of COVID ? ?Anesthesia review: yes - cardiac history;  ? ?Patient verbally denies any shortness of breath, fever, cough and chest pain during phone call ? ? ?-------------  SDW INSTRUCTIONS given: ? ?Your procedure is scheduled on Friday, May 12th, 2023. ? Report to [facility] Main Entrance "A" at 06:30 A.M., and check in at the Admitting office. ? Call this number if you have problems the morning of surgery: ? [phone] ? ? Remember: ? Do not eat or drink after midnight the night before your surgery ?  ? Take these medicines the morning of surgery with A SIP OF WATER Isosorbide, Metoprolol, Vascepa, Aspirin, Fenofibrate, Nitroglycerin, eye drops.  ? ?WHAT DO I DO ABOUT MY DIABETES MEDICATION? ? ? ?Do not take Metformin the morning of surgery. ? ?THE MORNING OF SURGERY, take 18 units of TRESIBA - 50% of your regular dose ? ?How do I manage my blood sugar before surgery? ?Check your blood sugar at least 4 times a day, starting 2 days before surgery, to make sure that the level is not too high or low. ? ?Check your blood sugar the morning of your surgery when you wake up and every 2 hours until  you get to the Short Stay unit. ? ?If your blood sugar is less than 70 mg/dL, you will need to treat for low blood sugar: ?Do not take insulin. ?Treat a low blood sugar (less than 70 mg/dL) with ? cup of clear juice (cranberry or apple), 4 glucose tablets, OR glucose gel. ?Recheck blood sugar in 15 minutes after treatment (to make sure it is greater than 70 mg/dL). If your blood sugar is not greater than 70 mg/dL on recheck, call [phone] for further instructions. ?Report your blood sugar to the short stay nurse when you get to Short Stay. ? ?If you are admitted to the hospital after surgery: ?Your blood sugar will be checked by the staff and you will probably be given insulin after surgery (instead of oral diabetes medicines) to make sure you have good blood sugar levels. ?The goal for blood sugar control after surgery is 80-180 mg/dL.  ? ? ?Patient was instructed to contact his endocrinologist/ PCP regarding insulin pump, if he must to reduce all basal rates by 20% at midnight and if he must keep all basal rates at reduced rate or lower. Patient verbalized understanding.  ? ? The day of surgery: ?         ?           Do not wear jewelry ?           Do not wear lotions, powders, colognes, or deodorant. ?  Men may shave face and neck. ?           Do not bring valuables to the hospital. ?           [facility] is not responsible for any belongings or valuables. ? ?Do NOT Smoke (Tobacco/Vaping) 24 hours prior to your procedure ?If you use a CPAP at night, you may bring all equipment for your overnight stay. ?  ?Contacts, glasses, dentures or bridgework may not be worn into surgery.    ?  ?For patients admitted to the hospital, discharge time will be determined by your treatment team. ?  ?Patients discharged the day of surgery will not be allowed to drive home, and someone needs to stay with them for 24 hours. ? ?Special instructions:   ?- Preparing For Surgery ? ?Before surgery, you can play an  important role. Because skin is not sterile, your skin needs to be as free of germs as possible. You can reduce the number of germs on your skin by washing with CHG (chlorahexidine gluconate) Soap before surgery.  CHG is an antiseptic cleaner which kills germs and bonds with the skin to continue killing germs even after washing.   ? ?Oral Hygiene is also important to reduce your risk of infection.  Remember - BRUSH YOUR TEETH THE MORNING OF SURGERY WITH YOUR REGULAR TOOTHPASTE ? ?Please do not use if you have an allergy to CHG or antibacterial soaps. If your skin becomes reddened/irritated stop using the CHG.  ?Do not shave (including legs and underarms) for at least 48 hours prior to first CHG shower. It is OK to shave your face. ? ?Please follow these instructions carefully. ?  ?Shower the NIGHT BEFORE SURGERY and the MORNING OF SURGERY with DIAL Soap.  ? ?Pat yourself dry with a CLEAN TOWEL. ? ?Wear CLEAN PAJAMAS to bed the night before surgery ? ?Place CLEAN SHEETS on your bed the night of your first shower and DO NOT SLEEP WITH PETS. ? ? ?Day of Surgery: ?Please shower morning of surgery  ?Wear Clean/Comfortable clothing the morning of surgery ?Do not apply any deodorants/lotions.   ?Remember to brush your teeth WITH YOUR REGULAR TOOTHPASTE. ?  ?Questions were answered. Patient verbalized understanding of instructions.  ? ? ?    ?

## 2021-07-27 NOTE — Progress Notes (Signed)
Diabetes Coordinator was notified that patient will be in short stay tomorrow at 06:30 o'clock for his surgery. ?

## 2021-07-27 NOTE — Anesthesia Preprocedure Evaluation (Addendum)
Anesthesia Evaluation  ?Patient identified by MRN, date of birth, ID band ?Patient awake ? ? ? ?Reviewed: ?Allergy & Precautions, H&P , NPO status , Patient's Chart, lab work & pertinent test results, reviewed documented beta blocker date and time  ? ?Airway ?Mallampati: II ? ?TM Distance: >3 FB ?Neck ROM: Full ? ? ? Dental ?no notable dental hx. ?(+) Teeth Intact, Dental Advisory Given ?  ?Pulmonary ?sleep apnea ,  ?  ?Pulmonary exam normal ?breath sounds clear to auscultation ? ? ? ? ? ? Cardiovascular ?hypertension, Pt. on medications and Pt. on home beta blockers ?+ CAD, + CABG and +CHF  ?+ dysrhythmias  ?Rhythm:Regular Rate:Normal ? ? ?  ?Neuro/Psych ?negative neurological ROS ? negative psych ROS  ? GI/Hepatic ?negative GI ROS, Neg liver ROS,   ?Endo/Other  ?diabetes, Type 1, Insulin Dependent ? Renal/GU ?negative Renal ROS  ?negative genitourinary ?  ?Musculoskeletal ? ? Abdominal ?  ?Peds ? Hematology ? ?(+) Blood dyscrasia, anemia ,   ?Anesthesia Other Findings ? ? Reproductive/Obstetrics ?negative OB ROS ? ?  ? ? ? ? ? ? ? ? ? ? ? ? ? ?  ?  ? ? ? ? ? ? ? ?Anesthesia Physical ?Anesthesia Plan ? ?ASA: 3 ? ?Anesthesia Plan: General  ? ?Post-op Pain Management: Ofirmev IV (intra-op)*  ? ?Induction: Intravenous ? ?PONV Risk Score and Plan: 3 and Ondansetron, Midazolam and Treatment may vary due to age or medical condition ? ?Airway Management Planned: Oral ETT ? ?Additional Equipment: Arterial line ? ?Intra-op Plan:  ? ?Post-operative Plan: Extubation in OR ? ?Informed Consent: I have reviewed the patients History and Physical, chart, labs and discussed the procedure including the risks, benefits and alternatives for the proposed anesthesia with the patient or authorized representative who has indicated his/her understanding and acceptance.  ? ? ? ?Dental advisory given ? ?Plan Discussed with: CRNA ? ?Anesthesia Plan Comments: (See APP note by Durel Salts, FNP. Severe CAD with no  intervention targets. )  ? ? ? ? ? ?Anesthesia Quick Evaluation ? ?

## 2021-07-28 ENCOUNTER — Encounter (HOSPITAL_COMMUNITY): Payer: Self-pay | Admitting: Surgery

## 2021-07-28 ENCOUNTER — Inpatient Hospital Stay (HOSPITAL_COMMUNITY)
Admission: RE | Admit: 2021-07-28 | Discharge: 2021-07-31 | DRG: 253 | Disposition: A | Discharge status: Home / Self Care | Payer: Commercial Managed Care - PPO | Attending: Surgery | Admitting: Surgery

## 2021-07-28 ENCOUNTER — Other Ambulatory Visit: Payer: Self-pay

## 2021-07-28 ENCOUNTER — Inpatient Hospital Stay (HOSPITAL_COMMUNITY): Payer: Commercial Managed Care - PPO | Admitting: Emergency Medicine

## 2021-07-28 ENCOUNTER — Encounter (HOSPITAL_COMMUNITY)
Admission: RE | Disposition: A | Discharge status: Home / Self Care | Payer: Self-pay | Source: Home / Self Care | Attending: Surgery

## 2021-07-28 DIAGNOSIS — Z888 Allergy status to other drugs, medicaments and biological substances status: Secondary | ICD-10-CM | POA: Diagnosis not present

## 2021-07-28 DIAGNOSIS — Z955 Presence of coronary angioplasty implant and graft: Secondary | ICD-10-CM

## 2021-07-28 DIAGNOSIS — I110 Hypertensive heart disease with heart failure: Secondary | ICD-10-CM | POA: Diagnosis present

## 2021-07-28 DIAGNOSIS — E1151 Type 2 diabetes mellitus with diabetic peripheral angiopathy without gangrene: Principal | ICD-10-CM | POA: Diagnosis present

## 2021-07-28 DIAGNOSIS — Z794 Long term (current) use of insulin: Secondary | ICD-10-CM

## 2021-07-28 DIAGNOSIS — Z923 Personal history of irradiation: Secondary | ICD-10-CM

## 2021-07-28 DIAGNOSIS — I2510 Atherosclerotic heart disease of native coronary artery without angina pectoris: Secondary | ICD-10-CM | POA: Diagnosis present

## 2021-07-28 DIAGNOSIS — Z951 Presence of aortocoronary bypass graft: Secondary | ICD-10-CM

## 2021-07-28 DIAGNOSIS — E785 Hyperlipidemia, unspecified: Secondary | ICD-10-CM | POA: Diagnosis present

## 2021-07-28 DIAGNOSIS — I509 Heart failure, unspecified: Secondary | ICD-10-CM | POA: Diagnosis present

## 2021-07-28 DIAGNOSIS — Z7982 Long term (current) use of aspirin: Secondary | ICD-10-CM | POA: Diagnosis not present

## 2021-07-28 DIAGNOSIS — E781 Pure hyperglyceridemia: Secondary | ICD-10-CM | POA: Diagnosis present

## 2021-07-28 DIAGNOSIS — I70212 Atherosclerosis of native arteries of extremities with intermittent claudication, left leg: Secondary | ICD-10-CM | POA: Diagnosis present

## 2021-07-28 DIAGNOSIS — K760 Fatty (change of) liver, not elsewhere classified: Secondary | ICD-10-CM | POA: Diagnosis present

## 2021-07-28 DIAGNOSIS — Z7984 Long term (current) use of oral hypoglycemic drugs: Secondary | ICD-10-CM

## 2021-07-28 DIAGNOSIS — C8590 Non-Hodgkin lymphoma, unspecified, unspecified site: Secondary | ICD-10-CM | POA: Diagnosis present

## 2021-07-28 DIAGNOSIS — G4733 Obstructive sleep apnea (adult) (pediatric): Secondary | ICD-10-CM | POA: Diagnosis present

## 2021-07-28 DIAGNOSIS — Z79899 Other long term (current) drug therapy: Secondary | ICD-10-CM | POA: Diagnosis not present

## 2021-07-28 DIAGNOSIS — I739 Peripheral vascular disease, unspecified: Principal | ICD-10-CM | POA: Diagnosis present

## 2021-07-28 DIAGNOSIS — I771 Stricture of artery: Secondary | ICD-10-CM | POA: Diagnosis not present

## 2021-07-28 DIAGNOSIS — I890 Lymphedema, not elsewhere classified: Secondary | ICD-10-CM | POA: Diagnosis present

## 2021-07-28 DIAGNOSIS — Z7902 Long term (current) use of antithrombotics/antiplatelets: Secondary | ICD-10-CM

## 2021-07-28 HISTORY — DX: Pneumonia, unspecified organism: J18.9

## 2021-07-28 HISTORY — DX: Sleep apnea, unspecified: G47.30

## 2021-07-28 HISTORY — PX: VEIN HARVEST: SHX6363

## 2021-07-28 HISTORY — PX: FEMORAL-POPLITEAL BYPASS GRAFT: SHX937

## 2021-07-28 LAB — CBC
HCT: 41 % (ref 39.0–52.0)
Hemoglobin: 14 g/dL (ref 13.0–17.0)
MCH: 27.6 pg (ref 26.0–34.0)
MCHC: 34.1 g/dL (ref 30.0–36.0)
MCV: 80.7 fL (ref 80.0–100.0)
Platelets: 147 10*3/uL — ABNORMAL LOW (ref 150–400)
RBC: 5.08 MIL/uL (ref 4.22–5.81)
RDW: 16.1 % — ABNORMAL HIGH (ref 11.5–15.5)
WBC: 8.1 10*3/uL (ref 4.0–10.5)
nRBC: 0 % (ref 0.0–0.2)

## 2021-07-28 LAB — POCT I-STAT 7, (LYTES, BLD GAS, ICA,H+H)
Acid-base deficit: 5 mmol/L — ABNORMAL HIGH (ref 0.0–2.0)
Acid-base deficit: 1 mmol/L (ref 0.0–2.0)
Bicarbonate: 21.9 mmol/L (ref 20.0–28.0)
Bicarbonate: 25.4 mmol/L (ref 20.0–28.0)
Calcium, Ion: 1.33 mmol/L (ref 1.15–1.40)
Calcium, Ion: 1.42 mmol/L — ABNORMAL HIGH (ref 1.15–1.40)
HCT: 39 % (ref 39.0–52.0)
HCT: 36 % — ABNORMAL LOW (ref 39.0–52.0)
Hemoglobin: 13.3 g/dL (ref 13.0–17.0)
Hemoglobin: 12.2 g/dL — ABNORMAL LOW (ref 13.0–17.0)
O2 Saturation: 87 %
O2 Saturation: 89 %
Potassium: 5 mmol/L (ref 3.5–5.1)
Potassium: 4.7 mmol/L (ref 3.5–5.1)
Sodium: 138 mmol/L (ref 135–145)
Sodium: 139 mmol/L (ref 135–145)
TCO2: 23 mmol/L (ref 22–32)
TCO2: 27 mmol/L (ref 22–32)
pCO2 arterial: 45.5 mmHg (ref 32–48)
pCO2 arterial: 48.2 mmHg — ABNORMAL HIGH (ref 32–48)
pH, Arterial: 7.291 — ABNORMAL LOW (ref 7.35–7.45)
pH, Arterial: 7.33 — ABNORMAL LOW (ref 7.35–7.45)
pO2, Arterial: 59 mmHg — ABNORMAL LOW (ref 83–108)
pO2, Arterial: 62 mmHg — ABNORMAL LOW (ref 83–108)

## 2021-07-28 LAB — GLUCOSE, CAPILLARY
Glucose-Capillary: 136 mg/dL — ABNORMAL HIGH (ref 70–99)
Glucose-Capillary: 137 mg/dL — ABNORMAL HIGH (ref 70–99)
Glucose-Capillary: 155 mg/dL — ABNORMAL HIGH (ref 70–99)
Glucose-Capillary: 173 mg/dL — ABNORMAL HIGH (ref 70–99)
Glucose-Capillary: 199 mg/dL — ABNORMAL HIGH (ref 70–99)
Glucose-Capillary: 241 mg/dL — ABNORMAL HIGH (ref 70–99)
Glucose-Capillary: 247 mg/dL — ABNORMAL HIGH (ref 70–99)
Glucose-Capillary: 243 mg/dL — ABNORMAL HIGH (ref 70–99)
Glucose-Capillary: 199 mg/dL — ABNORMAL HIGH (ref 70–99)
Glucose-Capillary: 170 mg/dL — ABNORMAL HIGH (ref 70–99)
Glucose-Capillary: 194 mg/dL — ABNORMAL HIGH (ref 70–99)
Glucose-Capillary: 148 mg/dL — ABNORMAL HIGH (ref 70–99)
Glucose-Capillary: 274 mg/dL — ABNORMAL HIGH (ref 70–99)

## 2021-07-28 LAB — COMPREHENSIVE METABOLIC PANEL
ALT: 72 U/L — ABNORMAL HIGH (ref 0–44)
AST: 55 U/L — ABNORMAL HIGH (ref 15–41)
Albumin: 3.5 g/dL (ref 3.5–5.0)
Alkaline Phosphatase: 41 U/L (ref 38–126)
Anion gap: 15 (ref 5–15)
BUN: 63 mg/dL — ABNORMAL HIGH (ref 6–20)
CO2: 19 mmol/L — ABNORMAL LOW (ref 22–32)
Calcium: 10.6 mg/dL — ABNORMAL HIGH (ref 8.9–10.3)
Chloride: 102 mmol/L (ref 98–111)
Creatinine, Ser: 1.91 mg/dL — ABNORMAL HIGH (ref 0.61–1.24)
GFR, Estimated: 41 mL/min — ABNORMAL LOW (ref >60)
Glucose, Bld: 130 mg/dL — ABNORMAL HIGH (ref 70–99)
Potassium: 4.5 mmol/L (ref 3.5–5.1)
Sodium: 136 mmol/L (ref 135–145)
Total Bilirubin: 0.7 mg/dL (ref 0.3–1.2)
Total Protein: 7.2 g/dL (ref 6.5–8.1)

## 2021-07-28 LAB — POCT I-STAT, CHEM 8
BUN: 57 mg/dL — ABNORMAL HIGH (ref 6–20)
Calcium, Ion: 1.3 mmol/L (ref 1.15–1.40)
Chloride: 106 mmol/L (ref 98–111)
Creatinine, Ser: 2 mg/dL — ABNORMAL HIGH (ref 0.61–1.24)
Glucose, Bld: 217 mg/dL — ABNORMAL HIGH (ref 70–99)
HCT: 40 % (ref 39.0–52.0)
Hemoglobin: 13.6 g/dL (ref 13.0–17.0)
Potassium: 5.1 mmol/L (ref 3.5–5.1)
Sodium: 138 mmol/L (ref 135–145)
TCO2: 24 mmol/L (ref 22–32)

## 2021-07-28 LAB — APTT: aPTT: 25 seconds (ref 24–36)

## 2021-07-28 LAB — URINALYSIS, ROUTINE W REFLEX MICROSCOPIC
Bacteria, UA: NONE SEEN
Bilirubin Urine: NEGATIVE
Glucose, UA: NEGATIVE mg/dL
Ketones, ur: NEGATIVE mg/dL
Leukocytes,Ua: NEGATIVE
Nitrite: NEGATIVE
Protein, ur: 100 mg/dL — AB
Specific Gravity, Urine: 1.01 (ref 1.005–1.030)
pH: 5 (ref 5.0–8.0)

## 2021-07-28 LAB — SURGICAL PCR SCREEN
MRSA, PCR: NEGATIVE
Staphylococcus aureus: POSITIVE — AB

## 2021-07-28 LAB — PROTIME-INR
INR: 1 (ref 0.8–1.2)
Prothrombin Time: 13 seconds (ref 11.4–15.2)

## 2021-07-28 SURGERY — BYPASS GRAFT FEMORAL-POPLITEAL ARTERY
Anesthesia: General | Site: Leg Upper | Laterality: Left

## 2021-07-28 MED ORDER — HEPARIN 6000 UNIT IRRIGATION SOLUTION
Status: AC
  Filled 2021-07-28: qty 500

## 2021-07-28 MED ORDER — METOPROLOL SUCCINATE ER 25 MG PO TB24
75.0000 mg | ORAL_TABLET | Freq: Two times a day (BID) | ORAL | Status: DC
  Administered 2021-07-28 – 2021-07-31 (×6): 75 mg via ORAL
  Filled 2021-07-28 (×6): qty 3

## 2021-07-28 MED ORDER — TORSEMIDE 20 MG PO TABS
40.0000 mg | ORAL_TABLET | Freq: Two times a day (BID) | ORAL | Status: DC
  Administered 2021-07-28 – 2021-07-31 (×6): 40 mg via ORAL
  Filled 2021-07-28 (×6): qty 2

## 2021-07-28 MED ORDER — INSULIN ASPART 100 UNIT/ML IJ SOLN
INTRAMUSCULAR | Status: DC | PRN
  Administered 2021-07-28 (×2): 10 [IU] via SUBCUTANEOUS

## 2021-07-28 MED ORDER — PROPOFOL 10 MG/ML IV BOLUS
INTRAVENOUS | Status: DC | PRN
  Administered 2021-07-28: 100 mg via INTRAVENOUS

## 2021-07-28 MED ORDER — ALUM & MAG HYDROXIDE-SIMETH 200-200-20 MG/5ML PO SUSP: 15.0000 mL | ORAL | Status: DC | PRN

## 2021-07-28 MED ORDER — ACETAMINOPHEN 325 MG PO TABS: 325.0000 mg | ORAL_TABLET | ORAL | Status: DC | PRN

## 2021-07-28 MED ORDER — POLYVINYL ALCOHOL 1.4 % OP SOLN
1.0000 [drp] | Freq: Four times a day (QID) | OPHTHALMIC | Status: DC | PRN
  Filled 2021-07-28: qty 15

## 2021-07-28 MED ORDER — CHLORHEXIDINE GLUCONATE CLOTH 2 % EX PADS: 6.0000 | MEDICATED_PAD | Freq: Once | CUTANEOUS | Status: DC

## 2021-07-28 MED ORDER — PROTAMINE SULFATE 10 MG/ML IV SOLN
INTRAVENOUS | Status: DC | PRN
  Administered 2021-07-28: 10 mg via INTRAVENOUS
  Administered 2021-07-28: 40 mg via INTRAVENOUS

## 2021-07-28 MED ORDER — 0.9 % SODIUM CHLORIDE (POUR BTL) OPTIME
TOPICAL | Status: DC | PRN
  Administered 2021-07-28: 2000 mL

## 2021-07-28 MED ORDER — PHENOL 1.4 % MT LIQD: 1.0000 | OROMUCOSAL | Status: DC | PRN

## 2021-07-28 MED ORDER — DEXTROSE 50 % IV SOLN: 0.0000 mL | INTRAVENOUS | Status: DC | PRN

## 2021-07-28 MED ORDER — PROPOFOL 10 MG/ML IV BOLUS
INTRAVENOUS | Status: AC
  Filled 2021-07-28: qty 20

## 2021-07-28 MED ORDER — MAGNESIUM SULFATE 2 GM/50ML IV SOLN: 2.0000 g | Freq: Every day | INTRAVENOUS | Status: DC | PRN

## 2021-07-28 MED ORDER — FENTANYL CITRATE (PF) 250 MCG/5ML IJ SOLN
INTRAMUSCULAR | Status: DC | PRN
  Administered 2021-07-28 (×3): 50 ug via INTRAVENOUS
  Administered 2021-07-28: 25 ug via INTRAVENOUS

## 2021-07-28 MED ORDER — ONDANSETRON HCL 4 MG/2ML IJ SOLN: 4.0000 mg | Freq: Four times a day (QID) | INTRAMUSCULAR | Status: DC | PRN

## 2021-07-28 MED ORDER — MIDAZOLAM HCL 2 MG/2ML IJ SOLN
INTRAMUSCULAR | Status: DC | PRN
  Administered 2021-07-28: 2 mg via INTRAVENOUS

## 2021-07-28 MED ORDER — FENTANYL CITRATE (PF) 250 MCG/5ML IJ SOLN
INTRAMUSCULAR | Status: AC
  Filled 2021-07-28: qty 5

## 2021-07-28 MED ORDER — PHENYLEPHRINE HCL-NACL 20-0.9 MG/250ML-% IV SOLN
INTRAVENOUS | Status: DC | PRN
  Administered 2021-07-28: 30 ug/min via INTRAVENOUS

## 2021-07-28 MED ORDER — ASPIRIN 81 MG PO CHEW
81.0000 mg | CHEWABLE_TABLET | Freq: Every day | ORAL | Status: DC
  Administered 2021-07-28 – 2021-07-31 (×4): 81 mg via ORAL
  Filled 2021-07-28 (×4): qty 1

## 2021-07-28 MED ORDER — HYDRALAZINE HCL 20 MG/ML IJ SOLN: 5.0000 mg | INTRAMUSCULAR | Status: DC | PRN

## 2021-07-28 MED ORDER — HEPARIN 6000 UNIT IRRIGATION SOLUTION
Status: DC | PRN
  Administered 2021-07-28: 1

## 2021-07-28 MED ORDER — HEPARIN SODIUM (PORCINE) 5000 UNIT/ML IJ SOLN
5000.0000 [IU] | Freq: Three times a day (TID) | INTRAMUSCULAR | Status: DC
  Administered 2021-07-29 – 2021-07-31 (×7): 5000 [IU] via SUBCUTANEOUS
  Filled 2021-07-28 (×7): qty 1

## 2021-07-28 MED ORDER — HYDROMORPHONE HCL 1 MG/ML IJ SOLN
0.2500 mg | INTRAMUSCULAR | Status: DC | PRN
  Administered 2021-07-28: 0.5 mg via INTRAVENOUS

## 2021-07-28 MED ORDER — SPIRONOLACTONE 25 MG PO TABS
25.0000 mg | ORAL_TABLET | Freq: Every day | ORAL | Status: DC
  Administered 2021-07-29 – 2021-07-31 (×3): 25 mg via ORAL
  Filled 2021-07-28 (×3): qty 1

## 2021-07-28 MED ORDER — METOPROLOL TARTRATE 5 MG/5ML IV SOLN: 2.0000 mg | INTRAVENOUS | Status: DC | PRN

## 2021-07-28 MED ORDER — LIDOCAINE 2% (20 MG/ML) 5 ML SYRINGE
INTRAMUSCULAR | Status: DC | PRN
  Administered 2021-07-28: 60 mg via INTRAVENOUS

## 2021-07-28 MED ORDER — MIDAZOLAM HCL 2 MG/2ML IJ SOLN
INTRAMUSCULAR | Status: AC
  Filled 2021-07-28: qty 2

## 2021-07-28 MED ORDER — LACTATED RINGERS IV SOLN: INTRAVENOUS | Status: DC | PRN

## 2021-07-28 MED ORDER — NITROGLYCERIN 0.4 MG SL SUBL: 0.4000 mg | SUBLINGUAL_TABLET | SUBLINGUAL | Status: DC | PRN

## 2021-07-28 MED ORDER — STERILE WATER FOR IRRIGATION IR SOLN
Status: DC | PRN
Start: 2021-07-28 — End: 2021-07-28
  Administered 2021-07-28: 1000 mL

## 2021-07-28 MED ORDER — SODIUM CHLORIDE 0.9 % IV SOLN: 500.0000 mL | Freq: Once | INTRAVENOUS | Status: DC | PRN

## 2021-07-28 MED ORDER — SODIUM CHLORIDE 0.9 % IV SOLN
INTRAVENOUS | Status: DC
Start: 2021-07-28 — End: 2021-07-28

## 2021-07-28 MED ORDER — HEPARIN SODIUM (PORCINE) 1000 UNIT/ML IJ SOLN
INTRAMUSCULAR | Status: AC
  Filled 2021-07-28: qty 10

## 2021-07-28 MED ORDER — DOCUSATE SODIUM 100 MG PO CAPS
100.0000 mg | ORAL_CAPSULE | Freq: Every day | ORAL | Status: DC
  Administered 2021-07-29 – 2021-07-31 (×3): 100 mg via ORAL
  Filled 2021-07-28 (×3): qty 1

## 2021-07-28 MED ORDER — EPHEDRINE SULFATE-NACL 50-0.9 MG/10ML-% IV SOSY
PREFILLED_SYRINGE | INTRAVENOUS | Status: DC | PRN
  Administered 2021-07-28 (×3): 5 mg via INTRAVENOUS

## 2021-07-28 MED ORDER — OXYCODONE-ACETAMINOPHEN 5-325 MG PO TABS
1.0000 | ORAL_TABLET | ORAL | Status: DC | PRN
  Administered 2021-07-28 – 2021-07-31 (×10): 2 via ORAL
  Filled 2021-07-28 (×10): qty 2

## 2021-07-28 MED ORDER — POTASSIUM CHLORIDE CRYS ER 20 MEQ PO TBCR: 20.0000 meq | EXTENDED_RELEASE_TABLET | Freq: Every day | ORAL | Status: DC | PRN

## 2021-07-28 MED ORDER — SACUBITRIL-VALSARTAN 49-51 MG PO TABS
1.0000 | ORAL_TABLET | Freq: Two times a day (BID) | ORAL | Status: DC
  Administered 2021-07-28 – 2021-07-31 (×6): 1 via ORAL
  Filled 2021-07-28 (×6): qty 1

## 2021-07-28 MED ORDER — ACETAMINOPHEN 650 MG RE SUPP: 325.0000 mg | RECTAL | Status: DC | PRN

## 2021-07-28 MED ORDER — INSULIN PUMP
Freq: Three times a day (TID) | SUBCUTANEOUS | Status: DC
  Administered 2021-07-28: 24.9 via SUBCUTANEOUS
  Administered 2021-07-29: 10 via SUBCUTANEOUS
  Administered 2021-07-29: 18 via SUBCUTANEOUS
  Administered 2021-07-29: 15 via SUBCUTANEOUS
  Administered 2021-07-29: 18 via SUBCUTANEOUS
  Filled 2021-07-28: qty 1

## 2021-07-28 MED ORDER — SODIUM BICARBONATE 8.4 % IV SOLN
INTRAVENOUS | Status: DC | PRN
  Administered 2021-07-28: 50 meq via INTRAVENOUS

## 2021-07-28 MED ORDER — CHLORHEXIDINE GLUCONATE 0.12 % MT SOLN: 15.0000 mL | Freq: Once | OROMUCOSAL | Status: AC

## 2021-07-28 MED ORDER — SUGAMMADEX SODIUM 200 MG/2ML IV SOLN
INTRAVENOUS | Status: DC | PRN
  Administered 2021-07-28: 400 mg via INTRAVENOUS

## 2021-07-28 MED ORDER — INSULIN REGULAR(HUMAN) IN NACL 100-0.9 UT/100ML-% IV SOLN
INTRAVENOUS | Status: DC
  Administered 2021-07-28: 2.2 [IU]/h via INTRAVENOUS
  Administered 2021-07-28: 7.5 [IU]/h via INTRAVENOUS
  Filled 2021-07-28 (×2): qty 100

## 2021-07-28 MED ORDER — PHENYLEPHRINE 80 MCG/ML (10ML) SYRINGE FOR IV PUSH (FOR BLOOD PRESSURE SUPPORT)
PREFILLED_SYRINGE | INTRAVENOUS | Status: DC | PRN
  Administered 2021-07-28 (×2): 160 ug via INTRAVENOUS
  Administered 2021-07-28: 120 ug via INTRAVENOUS

## 2021-07-28 MED ORDER — PHENYLEPHRINE HCL-NACL 20-0.9 MG/250ML-% IV SOLN
INTRAVENOUS | Status: AC
  Filled 2021-07-28: qty 500

## 2021-07-28 MED ORDER — PROPYLENE GLYCOL 0.6 % OP SOLN: 1.0000 [drp] | Freq: Four times a day (QID) | OPHTHALMIC | Status: DC | PRN

## 2021-07-28 MED ORDER — PANTOPRAZOLE SODIUM 40 MG PO TBEC
40.0000 mg | DELAYED_RELEASE_TABLET | Freq: Every day | ORAL | Status: DC
  Administered 2021-07-28 – 2021-07-31 (×4): 40 mg via ORAL
  Filled 2021-07-28 (×4): qty 1

## 2021-07-28 MED ORDER — CEFAZOLIN SODIUM-DEXTROSE 2-4 GM/100ML-% IV SOLN
INTRAVENOUS | Status: AC
  Administered 2021-07-28: 2 g via INTRAVENOUS
  Filled 2021-07-28: qty 100

## 2021-07-28 MED ORDER — CHLORHEXIDINE GLUCONATE 0.12 % MT SOLN
OROMUCOSAL | Status: AC
  Administered 2021-07-28: 15 mL via OROMUCOSAL
  Filled 2021-07-28: qty 15

## 2021-07-28 MED ORDER — ALBUMIN HUMAN 5 % IV SOLN: INTRAVENOUS | Status: DC | PRN

## 2021-07-28 MED ORDER — HYDROMORPHONE HCL 1 MG/ML IJ SOLN
INTRAMUSCULAR | Status: AC
  Filled 2021-07-28: qty 1

## 2021-07-28 MED ORDER — ROCURONIUM BROMIDE 10 MG/ML (PF) SYRINGE
PREFILLED_SYRINGE | INTRAVENOUS | Status: DC | PRN
  Administered 2021-07-28 (×2): 20 mg via INTRAVENOUS
  Administered 2021-07-28: 70 mg via INTRAVENOUS
  Administered 2021-07-28: 30 mg via INTRAVENOUS
  Administered 2021-07-28 (×2): 20 mg via INTRAVENOUS

## 2021-07-28 MED ORDER — DEXAMETHASONE SODIUM PHOSPHATE 10 MG/ML IJ SOLN
INTRAMUSCULAR | Status: DC | PRN
  Administered 2021-07-28: 5 mg via INTRAVENOUS

## 2021-07-28 MED ORDER — METFORMIN HCL 500 MG PO TABS
1000.0000 mg | ORAL_TABLET | Freq: Two times a day (BID) | ORAL | Status: DC
  Administered 2021-07-29: 1000 mg via ORAL
  Filled 2021-07-28: qty 2

## 2021-07-28 MED ORDER — CALCIUM CHLORIDE 10 % IV SOLN
INTRAVENOUS | Status: DC | PRN
  Administered 2021-07-28 (×2): 100 mg via INTRAVENOUS

## 2021-07-28 MED ORDER — CEFAZOLIN SODIUM-DEXTROSE 2-4 GM/100ML-% IV SOLN
2.0000 g | INTRAVENOUS | Status: AC
  Administered 2021-07-28: 2 g via INTRAVENOUS

## 2021-07-28 MED ORDER — LACTATED RINGERS IV SOLN: INTRAVENOUS | Status: DC

## 2021-07-28 MED ORDER — ISOSORBIDE MONONITRATE ER 30 MG PO TB24
30.0000 mg | ORAL_TABLET | Freq: Every day | ORAL | Status: DC
  Administered 2021-07-29 – 2021-07-31 (×3): 30 mg via ORAL
  Filled 2021-07-28 (×3): qty 1

## 2021-07-28 MED ORDER — GUAIFENESIN-DM 100-10 MG/5ML PO SYRP: 15.0000 mL | ORAL_SOLUTION | ORAL | Status: DC | PRN

## 2021-07-28 MED ORDER — CEFAZOLIN SODIUM-DEXTROSE 2-4 GM/100ML-% IV SOLN
2.0000 g | Freq: Three times a day (TID) | INTRAVENOUS | Status: AC
  Administered 2021-07-29: 2 g via INTRAVENOUS
  Filled 2021-07-28 (×2): qty 100

## 2021-07-28 MED ORDER — HEPARIN SODIUM (PORCINE) 1000 UNIT/ML IJ SOLN
INTRAMUSCULAR | Status: DC | PRN
  Administered 2021-07-28: 10000 [IU] via INTRAVENOUS
  Administered 2021-07-28: 2000 [IU] via INTRAVENOUS
  Administered 2021-07-28: 1000 [IU] via INTRAVENOUS

## 2021-07-28 MED ORDER — FENOFIBRATE 160 MG PO TABS
160.0000 mg | ORAL_TABLET | Freq: Every morning | ORAL | Status: DC
Start: 2021-07-29 — End: 2021-07-31
  Administered 2021-07-29 – 2021-07-31 (×3): 160 mg via ORAL
  Filled 2021-07-28 (×5): qty 1

## 2021-07-28 MED ORDER — HEMOSTATIC AGENTS (NO CHARGE) OPTIME
TOPICAL | Status: DC | PRN
  Administered 2021-07-28: 1 via TOPICAL

## 2021-07-28 MED ORDER — CLOPIDOGREL BISULFATE 75 MG PO TABS
75.0000 mg | ORAL_TABLET | Freq: Every day | ORAL | Status: DC
Start: 2021-07-28 — End: 2021-07-31
  Administered 2021-07-28 – 2021-07-31 (×4): 75 mg via ORAL
  Filled 2021-07-28 (×4): qty 1

## 2021-07-28 MED ORDER — LABETALOL HCL 5 MG/ML IV SOLN: 10.0000 mg | INTRAVENOUS | Status: DC | PRN

## 2021-07-28 MED ORDER — SODIUM CHLORIDE 0.9 % IV SOLN: INTRAVENOUS | Status: DC

## 2021-07-28 MED ORDER — ONDANSETRON HCL 4 MG/2ML IJ SOLN
INTRAMUSCULAR | Status: DC | PRN
  Administered 2021-07-28: 4 mg via INTRAVENOUS

## 2021-07-28 MED ORDER — ORAL CARE MOUTH RINSE: 15.0000 mL | Freq: Once | OROMUCOSAL | Status: AC

## 2021-07-28 SURGICAL SUPPLY — 50 items
BAG COUNTER SPONGE SURGICOUNT (BAG) ×2 IMPLANT
BANDAGE ESMARK 6X9 LF (GAUZE/BANDAGES/DRESSINGS) IMPLANT
BNDG ESMARK 6X9 LF (GAUZE/BANDAGES/DRESSINGS) ×2
CANISTER SUCT 3000ML PPV (MISCELLANEOUS) ×2 IMPLANT
CANNULA VESSEL 3MM 2 BLNT TIP (CANNULA) ×2 IMPLANT
CLIP TI WIDE RED SMALL 6 (CLIP) ×1 IMPLANT
CLIP VESOCCLUDE MED 24/CT (CLIP) ×2 IMPLANT
CLIP VESOCCLUDE SM WIDE 24/CT (CLIP) ×2 IMPLANT
COVER PROBE W GEL 5X96 (DRAPES) ×2 IMPLANT
COVER SURGICAL LIGHT HANDLE (MISCELLANEOUS) ×1 IMPLANT
CUFF TOURN SGL QUICK 24 (TOURNIQUET CUFF) ×1
CUFF TRNQT CYL 24X4X40X1 (TOURNIQUET CUFF) IMPLANT
DERMABOND ADVANCED (GAUZE/BANDAGES/DRESSINGS) ×2
DERMABOND ADVANCED .7 DNX12 (GAUZE/BANDAGES/DRESSINGS) ×1 IMPLANT
ELECT BLADE 4.0 EZ CLEAN MEGAD (MISCELLANEOUS) ×2
ELECT REM PT RETURN 9FT ADLT (ELECTROSURGICAL) ×2
ELECTRODE BLDE 4.0 EZ CLN MEGD (MISCELLANEOUS) IMPLANT
ELECTRODE REM PT RTRN 9FT ADLT (ELECTROSURGICAL) ×1 IMPLANT
GAUZE 4X4 16PLY ~~LOC~~+RFID DBL (SPONGE) ×1 IMPLANT
GLOVE BIOGEL PI IND STRL 6.5 (GLOVE) IMPLANT
GLOVE BIOGEL PI INDICATOR 6.5 (GLOVE) ×4
GLOVE SURG SS PI 7.5 STRL IVOR (GLOVE) ×6 IMPLANT
GOWN STRL REUS W/ TWL LRG LVL3 (GOWN DISPOSABLE) ×2 IMPLANT
GOWN STRL REUS W/ TWL XL LVL3 (GOWN DISPOSABLE) ×1 IMPLANT
GOWN STRL REUS W/TWL LRG LVL3 (GOWN DISPOSABLE) ×2
GOWN STRL REUS W/TWL XL LVL3 (GOWN DISPOSABLE) ×1
KIT BASIN OR (CUSTOM PROCEDURE TRAY) ×2 IMPLANT
KIT TURNOVER KIT B (KITS) ×2 IMPLANT
NS IRRIG 1000ML POUR BTL (IV SOLUTION) ×4 IMPLANT
PACK PERIPHERAL VASCULAR (CUSTOM PROCEDURE TRAY) ×2 IMPLANT
PAD ARMBOARD 7.5X6 YLW CONV (MISCELLANEOUS) ×4 IMPLANT
SPONGE T-LAP 18X18 ~~LOC~~+RFID (SPONGE) ×3 IMPLANT
SURGIFLO W/THROMBIN 8M KIT (HEMOSTASIS) ×1 IMPLANT
SUT PROLENE 5 0 C 1 24 (SUTURE) ×2 IMPLANT
SUT PROLENE 6 0 BV (SUTURE) ×3 IMPLANT
SUT PROLENE 7 0 BV 1 (SUTURE) ×1 IMPLANT
SUT SILK 2 0 SH (SUTURE) ×2 IMPLANT
SUT SILK 3 0 (SUTURE) ×1
SUT SILK 3-0 18XBRD TIE 12 (SUTURE) IMPLANT
SUT VIC AB 2-0 CT1 27 (SUTURE) ×2
SUT VIC AB 2-0 CT1 TAPERPNT 27 (SUTURE) ×2 IMPLANT
SUT VIC AB 3-0 SH 27 (SUTURE) ×5
SUT VIC AB 3-0 SH 27X BRD (SUTURE) ×2 IMPLANT
SUT VIC AB 4-0 PS2 18 (SUTURE) ×1 IMPLANT
SUT VICRYL 4-0 PS2 18IN ABS (SUTURE) ×4 IMPLANT
TAPE UMBILICAL 1/8X30 (MISCELLANEOUS) ×1 IMPLANT
TOWEL GREEN STERILE (TOWEL DISPOSABLE) ×2 IMPLANT
TRAY FOLEY MTR SLVR 16FR STAT (SET/KITS/TRAYS/PACK) ×2 IMPLANT
UNDERPAD 30X36 HEAVY ABSORB (UNDERPADS AND DIAPERS) ×2 IMPLANT
WATER STERILE IRR 1000ML POUR (IV SOLUTION) ×2 IMPLANT

## 2021-07-28 NOTE — Progress Notes (Signed)
PHARMACIST LIPID MONITORING ? ? ?Juan [name] is a 56 y.o. male admitted on 07/28/2021 with PVD.  Pharmacy has been consulted to optimize lipid-lowering therapy with the indication of secondary prevention for clinical ASCVD. ? ?Recent Labs: ? ?Lipid Panel (last 6 months):   ?No results found for: CHOL, TRIG, HDL, CHOLHDL, VLDL, LDLCALC, LDLDIRECT ? ?Hepatic function panel (last 6 months):   ?Lab Results  ?Component Value Date  ? AST 55 (H) 07/28/2021  ? ALT 72 (H) 07/28/2021  ? ALKPHOS 41 07/28/2021  ? BILITOT 0.7 07/28/2021  ? ? ?SCr (since admission):   ?Serum creatinine: 2 mg/dL (H) 07/28/21 1424 ?Estimated creatinine clearance: 51.6 mL/min (A) ? ?Current therapy and lipid therapy tolerance ?Current lipid-lowering therapy: fenofibrate, vascepa ?Documented or reported allergies or intolerances to lipid-lowering therapies (if applicable): none ? ?Assessment:   ?He is noted with severe hypertriglyceridemia and follows with lipid clinic.  ? ? ?Plan:   ?-Will not make changes to his regimen as there is some mention of research studies with lipid therapy. Further management per lipid clinic.  ? ? ?1.Statin intensity (high intensity recommended for all patients regardless of the LDL):   ?-No changes in therapy ? ?2.Add ezetimibe (if any one of the following):   Not indicated at this time. ? ?3.Refer to lipid clinic:   He follows with lipid clinic ? ?4.Follow-up with:  lipid clinic ? ?5.Follow-up labs after discharge:  No changes in lipid therapy, repeat a lipid panel in one year.    ? ?Hildred Laser, PharmD ?Clinical Pharmacist ?**Pharmacist phone directory can now be found on [email or web address] ([email or web address]).  Listed under [place]. ? ? ?

## 2021-07-28 NOTE — Anesthesia Postprocedure Evaluation (Signed)
Anesthesia Post Note ? ?Patient: Juan [name] ? ?Procedure(s) Performed: LEFT FEMORAL-BELOW KNEE POPLITEAL ARTERY BYPASS GRAFT (Left: Leg Upper) ?HARVEST OF GREAT SAPHENOUS VEIN (Left: Leg Upper) ? ?  ? ?Patient location during evaluation: PACU ?Anesthesia Type: General ?Level of consciousness: awake and alert ?Pain management: pain level controlled ?Vital Signs Assessment: post-procedure vital signs reviewed and stable ?Respiratory status: spontaneous breathing, nonlabored ventilation, respiratory function stable and patient connected to nasal cannula oxygen ?Cardiovascular status: blood pressure returned to baseline and stable ?Postop Assessment: no apparent nausea or vomiting ?Anesthetic complications: no ? ? ?No notable events documented. ? ?Last Vitals:  ?Vitals:  ? 07/28/21 1545 07/28/21 1600  ?BP: 114/62 114/63  ?Pulse: 91 90  ?Resp: 20 19  ?Temp:    ?SpO2: 98% 97%  ?  ?Last Pain:  ?Vitals:  ? 07/28/21 1600  ?TempSrc:   ?PainSc: 5   ? ? ?  ?  ?  ?  ?  ?  ? ?Shyra Emile,W. EDMOND ? ? ? ? ?

## 2021-07-28 NOTE — Anesthesia Procedure Notes (Signed)
Procedure Name: Intubation ?Date/Time: 07/28/2021 9:32 AM ?Performed by: Griffin Dakin, CRNA ?Pre-anesthesia Checklist: Patient identified, Emergency Drugs available, Suction available and Patient being monitored ?Patient Re-evaluated:Patient Re-evaluated prior to induction ?Oxygen Delivery Method: Circle system utilized ?Preoxygenation: Pre-oxygenation with 100% oxygen ?Induction Type: IV induction ?Ventilation: Mask ventilation without difficulty ?Laryngoscope Size: Glidescope and 3 ?Grade View: Grade I ?Tube type: Oral ?Tube size: 7.5 mm ?Number of attempts: 2 (first attempt with DL; poor view. Pt with history of ACDF. Switched to glidescope to avoid manipulation of neck. Grade 1 view with Glidescope.) ?Airway Equipment and Method: Video-laryngoscopy and Rigid stylet ?Placement Confirmation: ETT inserted through vocal cords under direct vision, positive ETCO2 and breath sounds checked- equal and bilateral ?Secured at: 23 cm ?Tube secured with: Tape ?Dental Injury: Teeth and Oropharynx as per pre-operative assessment  ? ? ? ? ?

## 2021-07-28 NOTE — Progress Notes (Signed)
Patient arrived to 4 E from the PACU. Vitals taken and stable. Patient placed on tele and CCMD notified. Patient oriented to unit. CHG completed. Call bell within reach and family at the bedside.  ?Juan [name]  ?

## 2021-07-28 NOTE — Interval H&P Note (Signed)
History and Physical Interval Note: ? ?07/28/2021 ?8:40 AM ? ?Juan [name]  has presented today for surgery, with the diagnosis of Atherosclerosis of native artery of left lower extremity with intermittent claudication.  The various methods of treatment have been discussed with the patient and family. After consideration of risks, benefits and other options for treatment, the patient has consented to  Procedure(s): ?LEFT FEMORAL-BELOW KNEE POPLITEAL ARTERY BYPASS GRAFT (Left) as a surgical intervention.  The patient's history has been reviewed, patient examined, no change in status, stable for surgery.  I have reviewed the patient's chart and labs.  Questions were answered to the patient's satisfaction.   ? ? ?Juan [name] ? ? ?

## 2021-07-28 NOTE — Progress Notes (Signed)
PT Cancellation Note ? ?Patient Details ?Name: Juan [name] ?MRN: [id number] ?DOB: 1965/05/06 ? ? ?Cancelled Treatment:    Reason Eval/Treat Not Completed: Other (comment). RN reporting pt is currently on bedrest. Will plan to follow-up tomorrow as able. ? ? ?Moishe Spice, PT, DPT ?Acute Rehabilitation Services  ?Pager: [phone] ?Office: [phone] ? ? ? ?Maretta Bees Pettis ?07/28/2021, 5:30 PM ? ? ?

## 2021-07-28 NOTE — Anesthesia Procedure Notes (Signed)
Arterial Line Insertion ?Start/End5/02/2022 8:55 AM, 07/28/2021 9:05 AM ?Performed by: Roderic Palau, MD ? Patient location: Pre-op. ?Preanesthetic checklist: patient identified, IV checked, site marked, risks and benefits discussed, surgical consent, monitors and equipment checked, pre-op evaluation, timeout performed and anesthesia consent ?Lidocaine 1% used for infiltration ?Right, brachial was placed ?Catheter size: 20 G ?Hand hygiene performed , maximum sterile barriers used  and Seldinger technique used ? ?Attempts: 1 ?Procedure performed using ultrasound guided technique. ?Ultrasound Notes:anatomy identified, needle tip was noted to be adjacent to the nerve/plexus identified, no ultrasound evidence of intravascular and/or intraneural injection and image(s) printed for medical record ?Following insertion, dressing applied, Biopatch and line sutured. ?Post procedure assessment: normal and unchanged ? ?Post procedure complications: second provider assisted and unsuccessful attempts (SRNA attempted radially. Unsuccessful.). ?Patient tolerated the procedure well with no immediate complications. ? ? ? ?

## 2021-07-28 NOTE — Transfer of Care (Signed)
Immediate Anesthesia Transfer of Care Note ? ?Patient: DESHON HSIAO ? ?Procedure(s) Performed: LEFT FEMORAL-BELOW KNEE POPLITEAL ARTERY BYPASS GRAFT (Left: Leg Upper) ?HARVEST OF GREAT SAPHENOUS VEIN (Left: Leg Upper) ? ?Patient Location: PACU ? ?Anesthesia Type:General ? ?Level of Consciousness: awake, alert  and oriented ? ?Airway & Oxygen Therapy: Patient Spontanous Breathing and Patient connected to face mask oxygen ? ?Post-op Assessment: Report given to RN and Post -op Vital signs reviewed and stable ? ?Post vital signs: Reviewed and stable ? ?Last Vitals:  ?Vitals Value Taken Time  ?BP 104/63 07/28/21 1359  ?Temp    ?Pulse 93 07/28/21 1410  ?Resp 27 07/28/21 1410  ?SpO2 97 % 07/28/21 1410  ?Vitals shown include unvalidated device data. ? ?Last Pain:  ?Vitals:  ? 07/28/21 0644  ?TempSrc:   ?PainSc: 3   ?   ? ?Patients Stated Pain Goal: 3 (07/28/21 5726) ? ?Complications: No notable events documented. ?

## 2021-07-29 ENCOUNTER — Encounter (HOSPITAL_COMMUNITY): Payer: Self-pay | Admitting: Surgery

## 2021-07-29 LAB — POCT I-STAT 7, (LYTES, BLD GAS, ICA,H+H)
Acid-base deficit: 2 mmol/L (ref 0.0–2.0)
Acid-base deficit: 4 mmol/L — ABNORMAL HIGH (ref 0.0–2.0)
Acid-base deficit: 4 mmol/L — ABNORMAL HIGH (ref 0.0–2.0)
Bicarbonate: 24.3 mmol/L (ref 20.0–28.0)
Bicarbonate: 24.3 mmol/L (ref 20.0–28.0)
Bicarbonate: 24.9 mmol/L (ref 20.0–28.0)
Calcium, Ion: 1.28 mmol/L (ref 1.15–1.40)
Calcium, Ion: 1.29 mmol/L (ref 1.15–1.40)
Calcium, Ion: 1.3 mmol/L (ref 1.15–1.40)
HCT: 41 % (ref 39.0–52.0)
HCT: 44 % (ref 39.0–52.0)
HCT: 44 % (ref 39.0–52.0)
Hemoglobin: 13.9 g/dL (ref 13.0–17.0)
Hemoglobin: 15 g/dL (ref 13.0–17.0)
Hemoglobin: 15 g/dL (ref 13.0–17.0)
O2 Saturation: 99 %
O2 Saturation: 99 %
O2 Saturation: 99 %
Patient temperature: 37.8
Patient temperature: 37.8
Potassium: 6.3 mmol/L (ref 3.5–5.1)
Potassium: 6.7 mmol/L (ref 3.5–5.1)
Potassium: 6.9 mmol/L (ref 3.5–5.1)
Sodium: 131 mmol/L — ABNORMAL LOW (ref 135–145)
Sodium: 131 mmol/L — ABNORMAL LOW (ref 135–145)
Sodium: 133 mmol/L — ABNORMAL LOW (ref 135–145)
TCO2: 26 mmol/L (ref 22–32)
TCO2: 26 mmol/L (ref 22–32)
TCO2: 27 mmol/L (ref 22–32)
pCO2 arterial: 47.9 mmHg (ref 32–48)
pCO2 arterial: 58.3 mmHg — ABNORMAL HIGH (ref 32–48)
pCO2 arterial: 61.8 mmHg — ABNORMAL HIGH (ref 32–48)
pH, Arterial: 7.213 — ABNORMAL LOW (ref 7.35–7.45)
pH, Arterial: 7.232 — ABNORMAL LOW (ref 7.35–7.45)
pH, Arterial: 7.317 — ABNORMAL LOW (ref 7.35–7.45)
pO2, Arterial: 153 mmHg — ABNORMAL HIGH (ref 83–108)
pO2, Arterial: 156 mmHg — ABNORMAL HIGH (ref 83–108)
pO2, Arterial: 164 mmHg — ABNORMAL HIGH (ref 83–108)

## 2021-07-29 LAB — CBC
HCT: 35.1 % — ABNORMAL LOW (ref 39.0–52.0)
Hemoglobin: 11.2 g/dL — ABNORMAL LOW (ref 13.0–17.0)
MCH: 26.7 pg (ref 26.0–34.0)
MCHC: 31.9 g/dL (ref 30.0–36.0)
MCV: 83.6 fL (ref 80.0–100.0)
Platelets: 82 10*3/uL — ABNORMAL LOW (ref 150–400)
RBC: 4.2 MIL/uL — ABNORMAL LOW (ref 4.22–5.81)
RDW: 16.2 % — ABNORMAL HIGH (ref 11.5–15.5)
WBC: 8.5 10*3/uL (ref 4.0–10.5)
nRBC: 0 % (ref 0.0–0.2)

## 2021-07-29 LAB — GLUCOSE, CAPILLARY
Glucose-Capillary: 150 mg/dL — ABNORMAL HIGH (ref 70–99)
Glucose-Capillary: 194 mg/dL — ABNORMAL HIGH (ref 70–99)
Glucose-Capillary: 133 mg/dL — ABNORMAL HIGH (ref 70–99)
Glucose-Capillary: 55 mg/dL — ABNORMAL LOW (ref 70–99)

## 2021-07-29 LAB — BASIC METABOLIC PANEL
Anion gap: 8 (ref 5–15)
BUN: 48 mg/dL — ABNORMAL HIGH (ref 6–20)
CO2: 25 mmol/L (ref 22–32)
Calcium: 9.4 mg/dL (ref 8.9–10.3)
Chloride: 104 mmol/L (ref 98–111)
Creatinine, Ser: 1.6 mg/dL — ABNORMAL HIGH (ref 0.61–1.24)
GFR, Estimated: 51 mL/min — ABNORMAL LOW (ref >60)
Glucose, Bld: 161 mg/dL — ABNORMAL HIGH (ref 70–99)
Potassium: 4.2 mmol/L (ref 3.5–5.1)
Sodium: 137 mmol/L (ref 135–145)

## 2021-07-29 LAB — POCT ACTIVATED CLOTTING TIME: Activated Clotting Time: 233 seconds

## 2021-07-29 MED ORDER — METFORMIN HCL 500 MG PO TABS
1000.0000 mg | ORAL_TABLET | Freq: Two times a day (BID) | ORAL | Status: DC
  Administered 2021-07-29 – 2021-07-31 (×4): 1000 mg via ORAL
  Filled 2021-07-29 (×4): qty 2

## 2021-07-29 NOTE — Op Note (Signed)
? ? ?Patient name: Juan [name] MRN: [id number] DOB: 1966-02-23 Sex: male ? ?07/28/2021 ?Pre-operative Diagnosis: Severe left leg claudication ?Post-operative diagnosis:  Same ?Surgeon:  Annamarie Major ?Assistants:  Laurence Slate, PA ?Procedure:   Left distal superficial femoral artery to tibioperoneal trunk bypass graft with ipsilateral nonreversed saphenous vein ?Anesthesia:  General ?Blood Loss:  150cc ?Specimens:  none ? ?Findings: The patient has a history of radiation to the knee.  I felt that the saphenous vein along the medial knee had been subject to some without radiation as it was very scarred and.  I was worried about the use of this portion of the vein for bypass therefore I harvested the saphenous vein from the saphenofemoral junction to above-knee and ended up doing the bypass coming off of the distal superficial femoral artery which was healthy down to the tibioperoneal trunk which was also healthy ? ?Indications: This is a 56 year old gentleman with severe hypertriglyceridemia and coronary artery disease who had acute symptoms of severe left leg claudication.  He underwent angiography which revealed a popliteal occlusion.  He does have a history of radiation to the left knee.  Because of the severity of his symptoms, we decided to proceed with revascularization ? ?Procedure:  The patient was identified in the holding area and taken to [facility]  The patient was then placed supine on the table. general anesthesia was administered.  The patient was prepped and draped in the usual sterile fashion.  A time out was called and antibiotics were administered.  A PA was necessary to explant the procedure and assist with technical details. ? ?Ultrasound was used to map the course of the saphenous vein from the saphenofemoral junction to the mid calf.  It was of adequate caliber for bypass.  Because of the patient's history of radiation to the knee I elected to begin with a medial below-knee incision.   Through this incision I dissected out the saphenous vein.  Up around the knee the vein was relatively scarred in.  It did appear to be of adequate diameter.  I then carried the dissection down to the fascia which was opened and then I entered the popliteal space.  Again the tissue in this area did not appear to be very healthy and was somewhat scarred in.  Therefore I elected to take down the soleus muscle from the tibia and I exposed the tibioperoneal trunk.  The artery at this level was healthy.  I dissected down to the bifurcation.  I also dissected proximally and again started encountering some scar tissue.  The tibioperoneal trunk was a healthy 3 to 4 mm artery and I felt it was the best target.  Next, I made a medial above-knee incision.  I first dissected out the saphenous vein.  At this level it was healthy and not scarred in.  I dissected distally towards the knee and it became somewhat scarred and.  Because the angiogram showed a healthy appearing superficial femoral artery down to the adductor canal, I elected to expose this artery.  It did appear to be healthy without significant calcification I then proceeded to harvest the saphenous vein up to the saphenofemoral junction.  The PA helped with the exposure by providing suction and retraction.  Once the vein was fully dissected free, it was ligated proximally and distally.  It was prepared on the back table and it distended nicely.  Next, I created a tunnel between the medial above and below-knee incisions.  An umbilical  tape was passed through the tunnel.  Next the patient was fully heparinized.  After the heparin circulated, a tourniquet was placed on the upper thigh.  An Esmarch was used to exsanguinate the leg and the tourniquet was inflated.  I then opened the distal superficial femoral artery.  Again this was a healthy artery.  The vein was placed in a nonreversed fashion.  It was spatulated to fit the size the arteriotomy in a running anastomosis  was created with 5-0 Prolene.  The tourniquet was then let down.  I then lysed the valves of the vein with a valvulotome.  2 passes were made.  There was excellent flow through the vein graft.  The vein was then brought to the previously created tunnel.  The leg was then exsanguinated with an Esmarch and the tourniquet was reinflated.  I then opened the tibioperoneal trunk.  This was extended longitudinally with Potts scissors.  This is a healthy-appearing artery.  The leg was straightened and the vein graft was cut to the appropriate length.  It was spatulated to fit the size the arteriotomy in a running anastomosis was created with 6-0 Prolene.  Prior to completion, the tourniquet was let down.  The appropriate flushing maneuvers were performed and the anastomosis was completed.  At this point, the patient had a brisk posterior tibial Doppler signal that was graft dependent.  I was satisfied with these results.  The patient's heparin was reversed with protamine.  The wounds were irrigated and hemostasis was achieved.  The PA assisted with closure.  I closed the medial and above-knee incisions by reapproximating the fascia with 2-0 Vicryl, subcutaneous tissue with 3-0 Vicryl and then subcuticular closure.  The vein harvest incisions were then closed with a deep layer of Vicryl and subcuticular closure.  Dermabond was placed on the incisions.  Patient was successfully extubated taken recovery in stable condition.  There are no immediate complications. ? ? ?Disposition: To PACU stable. ? ? ?V. Annamarie Major, M.D., FACS ?[facility] ?Office: [phone] ?Pager:  [phone]  ?

## 2021-07-29 NOTE — Plan of Care (Signed)

## 2021-07-29 NOTE — Evaluation (Signed)
Occupational Therapy Evaluation ?Patient Details ?Name: Juan [name] ?MRN: [id number] ?DOB: 1965-11-20 ?Today's Date: 07/29/2021 ? ? ?History of Present Illness 56 y.o. male, who reports left leg pain with mobility.  S/p AK pop to BK TP trunk bypass.  PMH includes: CHF, DM, HTN, CABGx4.  ? ?Clinical Impression ?  ?Patient admitted for the procedure above.  PTA he lives with his spouse, who can assist as needed, but does work outside of the home.  Primary deficit is L leg pain.  He is needing Min A for basic mobility and lower body ADL, but should progress quickly as pain lessens.  OT will follow in the acute setting, and no post acute OT is anticipated.    ?   ? ?Recommendations for follow up therapy are one component of a multi-disciplinary discharge planning process, led by the attending physician.  Recommendations may be updated based on patient status, additional functional criteria and insurance authorization.  ? ?Follow Up Recommendations ? No OT follow up  ?  ?Assistance Recommended at Discharge Intermittent Supervision/Assistance  ?Patient can return home with the following Assistance with cooking/housework;Assist for transportation ? ?  ?Functional Status Assessment ? Patient has had a recent decline in their functional status and demonstrates the ability to make significant improvements in function in a reasonable and predictable amount of time.  ?Equipment Recommendations ? None recommended by OT  ?  ?Recommendations for Other Services   ? ? ?  ?Precautions / Restrictions    ? ?  ? ?Mobility Bed Mobility ?Overal bed mobility: Needs Assistance ?Bed Mobility: Supine to Sit ?  ?  ?Supine to sit: Supervision, HOB elevated ?  ?  ?  ?  ? ?Transfers ?Overall transfer level: Needs assistance ?Equipment used: 1 person hand held assist ?Transfers: Sit to/from Stand, Bed to chair/wheelchair/BSC ?Sit to Stand: Min guard ?  ?  ?Step pivot transfers: Min assist ?  ?  ?  ?  ? ?  ?Balance Overall balance assessment: Needs  assistance ?Sitting-balance support: Feet supported, Bilateral upper extremity supported ?Sitting balance-Leahy Scale: Good ?  ?  ?Standing balance support: Reliant on assistive device for balance ?Standing balance-Leahy Scale: Fair ?  ?  ?  ?  ?  ?  ?  ?  ?  ?  ?  ?  ?   ? ?ADL either performed or assessed with clinical judgement  ? ?ADL   ?  ?  ?Grooming: Wash/dry hands;Oral care;Set up;Sitting ?  ?  ?  ?  ?  ?Upper Body Dressing : Set up;Sitting ?  ?Lower Body Dressing: Minimal assistance;Sit to/from stand ?  ?Toilet Transfer: Minimal assistance;Stand-pivot;BSC/3in1 ?  ?  ?  ?  ?  ?  ?   ? ? ? ?Vision Baseline Vision/History: 1 Wears glasses ?Patient Visual Report: No change from baseline ?   ?   ?Perception Perception ?Perception: Not tested ?  ?Praxis Praxis ?Praxis: Not tested ?  ? ?Pertinent Vitals/Pain Pain Assessment ?Pain Assessment: Faces ?Faces Pain Scale: Hurts even more ?Pain Location: L leg ?Pain Descriptors / Indicators: Burning, Grimacing, Guarding ?Pain Intervention(s): Monitored during session  ? ? ? ?Hand Dominance Right ?  ?Extremity/Trunk Assessment Upper Extremity Assessment ?Upper Extremity Assessment: Overall WFL for tasks assessed ?  ?Lower Extremity Assessment ?Lower Extremity Assessment: Defer to PT evaluation ?  ?Cervical / Trunk Assessment ?Cervical / Trunk Assessment: Normal ?  ?Communication Communication ?Communication: No difficulties ?  ?Cognition Arousal/Alertness: Awake/alert ?Behavior During Therapy: Flat affect ?Overall Cognitive Status:  Within Functional Limits for tasks assessed ?  ?  ?  ?  ?  ?  ?  ?  ?  ?  ?  ?  ?  ?  ?  ?  ?  ?  ?  ?General Comments   VSS on RA, BP a little low. ? ?  ?Exercises   ?  ?Shoulder Instructions    ? ? ?Home Living Family/patient expects to be discharged to:: Private residence ?Living Arrangements: Spouse/significant other ?Available Help at Discharge: Family;Available PRN/intermittently ?Type of Home: House ?Home Access: Stairs to  enter ?Entrance Stairs-Number of Steps: 1 ?Entrance Stairs-Rails: None ?Home Layout: Two level;Bed/bath upstairs;1/2 bath on main level ?Alternate Level Stairs-Number of Steps: 13 ?Alternate Level Stairs-Rails: Right ?Bathroom Shower/Tub: Tub/shower unit;Walk-in shower ?  ?Bathroom Toilet: Standard ?Bathroom Accessibility: Yes ?How Accessible: Accessible via walker ?Home Equipment: None ?  ?  ?  ? ?  ?Prior Functioning/Environment Prior Level of Function : Independent/Modified Independent;Working/employed;Driving ?  ?  ?  ?  ?  ?  ?  ?  ?  ? ?  ?  ?OT Problem List: Impaired balance (sitting and/or standing);Pain ?  ?   ?OT Treatment/Interventions: Self-care/ADL training;Balance training;Therapeutic activities;DME and/or AE instruction  ?  ?OT Goals(Current goals can be found in the care plan section) Acute Rehab OT Goals ?Patient Stated Goal: Head home when they think I'm ready ?OT Goal Formulation: With patient ?Time For Goal Achievement: 08/11/21 ?Potential to Achieve Goals: Good ?ADL Goals ?Pt Will Perform Lower Body Dressing: with modified independence;sit to/from stand ?Pt Will Transfer to Toilet: with modified independence;ambulating;regular height toilet  ?OT Frequency: Min 2X/week ?  ? ?Co-evaluation   ?  ?  ?  ?  ? ?  ?AM-PAC OT "6 Clicks" Daily Activity     ?Outcome Measure Help from another person eating meals?: None ?Help from another person taking care of personal grooming?: None ?Help from another person toileting, which includes using toliet, bedpan, or urinal?: A Little ?Help from another person bathing (including washing, rinsing, drying)?: A Little ?Help from another person to put on and taking off regular upper body clothing?: None ?Help from another person to put on and taking off regular lower body clothing?: A Little ?6 Click Score: 21 ?  ?End of Session Nurse Communication: Mobility status ? ?Activity Tolerance: Patient tolerated treatment well ?Patient left: in chair;with call bell/phone  within reach ? ?OT Visit Diagnosis: Unsteadiness on feet (R26.81);Pain ?Pain - Right/Left: Left ?Pain - part of body: Leg  ?              ?Time: 7846-9629 ?OT Time Calculation (min): 20 min ?Charges:  OT General Charges ?$OT Visit: 1 Visit ?OT Evaluation ?$OT Eval Moderate Complexity: 1 Mod ? ?07/29/2021 ? ?RP, OTR/L ? ?Acute Rehabilitation Services ? ?Office:  [phone] ? ? ?Tamura Lasky D Sueann Brownley ?07/29/2021, 8:45 AM ?

## 2021-07-29 NOTE — Evaluation (Signed)
Physical Therapy Evaluation ?Patient Details ?Name: Juan [name] ?MRN: [id number] ?DOB: [birth year] ?Today's Date: 07/29/2021 ? ?History of Present Illness ? Pt is a 56 y.o. male who presented 07/28/21 for L femoral-below knee popliteal artery bypass graft secondary to L leg severe claudication. PMH: anemia, CAD, CHF, DM, HLD, HTN, lateral meniscus tear, non hodgkin's lymphoma, pancreatitis, s/p CABG x4 02/05/13, splenomegaly ?  ?Clinical Impression ? Pt presents with condition above and deficits mentioned below, see PT Problem List. PTA, he was IND without DME, living with his wife in a 2-level house (1/2 bath downstairs but can remain on main floor if needed) with 1 STE. Currently, pt is limited in mobility by his L lower extremity pain. Pt also with L lower extremity edema that also contributes to his deficits in L knee AROM and L ankle dorsiflexion AROM. Pt with slow, antalgic gait pattern, benefiting from a RW for stability and pain management. Pt was able to perform all functional mobility slowly, but safely without LOB or assistance today. Verbally reviewed navigation of stairs, but would benefit from stair training in future sessions. Educated pt on ROM of his L lower extremity. At this time, recommending OPPT to maximize pt's return to baseline, but he may progress quickly and not need any follow-up PT. Will continue to follow acutely and assess d/c needs. ?   ? ?Recommendations for follow up therapy are one component of a multi-disciplinary discharge planning process, led by the attending physician.  Recommendations may be updated based on patient status, additional functional criteria and insurance authorization. ? ?Follow Up Recommendations Outpatient PT (vs no PT follow-up pending progress) ? ?  ?Assistance Recommended at Discharge PRN  ?Patient can return home with the following ? A little help with bathing/dressing/bathroom;Assistance with cooking/housework;Assist for transportation;Help with stairs or  ramp for entrance ? ?  ?Equipment Recommendations Rolling walker (2 wheels)  ?Recommendations for Other Services ?    ?  ?Functional Status Assessment Patient has had a recent decline in their functional status and demonstrates the ability to make significant improvements in function in a reasonable and predictable amount of time.  ? ?  ?Precautions / Restrictions Precautions ?Precautions: Fall ?Restrictions ?Weight Bearing Restrictions: No  ? ?  ? ?Mobility ? Bed Mobility ?Overal bed mobility: Modified Independent ?Bed Mobility: Sit to Supine ?  ?  ?  ?Sit to supine: Modified independent (Device/Increase time), HOB elevated ?  ?General bed mobility comments: Able to transition sit > supine using blanket around L leg to pull on to assist L leg into bed without assistance. ?  ? ?Transfers ?Overall transfer level: Needs assistance ?Equipment used: Rolling walker (2 wheels) ?Transfers: Sit to/from Stand ?Sit to Stand: Min guard ?  ?  ?  ?  ?  ?General transfer comment: Cues for hand placement, min guard for safety ?  ? ?Ambulation/Gait ?Ambulation/Gait assistance: Supervision ?Gait Distance (Feet): 80 Feet ?Assistive device: Rolling walker (2 wheels) ?Gait Pattern/deviations: Step-through pattern, Decreased step length - left, Decreased dorsiflexion - left, Decreased stride length, Antalgic, Trunk flexed, Decreased weight shift to left ?Gait velocity: reduced ?Gait velocity interpretation: <1.8 ft/sec, indicate of risk for recurrent falls ?  ?General Gait Details: Pt with slow, antalgic gait pattern and slightly flexed posture. Initially only making contact with forefoot of L foot with stepping, but progressed to eventually getting foot flat during stance but still having mid-foot initial contact. No LOB, supervision for safety ? ?Stairs ?  ?  ?  ?  ?General stair comments:  Verbally reviewed sequencing of feet on stairs, educating pt to lead up with R foot and down with L ? ?Wheelchair Mobility ?  ? ?Modified Rankin  (Stroke Patients Only) ?  ? ?  ? ?Balance Overall balance assessment: Needs assistance ?Sitting-balance support: No upper extremity supported, Feet supported ?Sitting balance-Leahy Scale: Good ?  ?  ?Standing balance support: Bilateral upper extremity supported, During functional activity, Reliant on assistive device for balance ?Standing balance-Leahy Scale: Poor ?Standing balance comment: Reliant on RW for gait ?  ?  ?  ?  ?  ?  ?  ?  ?  ?  ?  ?   ? ? ? ?Pertinent Vitals/Pain Pain Assessment ?Pain Assessment: 0-10 ?Pain Score: 6  (reduced to 4 by end of session) ?Pain Location: L leg ?Pain Descriptors / Indicators: Discomfort, Grimacing, Operative site guarding ?Pain Intervention(s): Limited activity within patient's tolerance, Monitored during session, Repositioned, Premedicated before session  ? ? ?Home Living Family/patient expects to be discharged to:: Private residence ?Living Arrangements: Spouse/significant other ?Available Help at Discharge: Family;Available PRN/intermittently ?Type of Home: House ?Home Access: Stairs to enter ?Entrance Stairs-Rails: None ?Entrance Stairs-Number of Steps: 1 ?Alternate Level Stairs-Number of Steps: 13 ?Home Layout: Two level;Bed/bath upstairs;1/2 bath on main level;Able to live on main level with bedroom/bathroom ?Home Equipment: None ?   ?  ?Prior Function Prior Level of Function : Independent/Modified Independent;Working/employed;Driving ?  ?  ?  ?  ?  ?  ?Mobility Comments: No AD ?  ?  ? ? ?Hand Dominance  ? Dominant Hand: Right ? ?  ?Extremity/Trunk Assessment  ? Upper Extremity Assessment ?Upper Extremity Assessment: Defer to OT evaluation ?  ? ?Lower Extremity Assessment ?Lower Extremity Assessment: LLE deficits/detail ?LLE Deficits / Details: WFL strength; edema noted with impaired sensation (detects touch though) noted in foot; decreased ankle dorsiflexion and knee AROM ?LLE Sensation: decreased light touch ?  ? ?Cervical / Trunk Assessment ?Cervical / Trunk  Assessment: Normal  ?Communication  ? Communication: No difficulties  ?Cognition Arousal/Alertness: Awake/alert ?Behavior During Therapy: [facility] for tasks assessed/performed ?Overall Cognitive Status: Within Functional Limits for tasks assessed ?  ?  ?  ?  ?  ?  ?  ?  ?  ?  ?  ?  ?  ?  ?  ?  ?  ?  ?  ? ?  ?General Comments General comments (skin integrity, edema, etc.): educated pt on stretching L ankle into dorsiflexion and on AROM of L knee into flexion and extension ? ?  ?Exercises Other Exercises ?Other Exercises: pt performing L knee AROM and self-stretch using blanket to ankle into dorsiflexion while sitting EOB  ? ?Assessment/Plan  ?  ?PT Assessment Patient needs continued PT services  ?PT Problem List Decreased range of motion;Decreased activity tolerance;Decreased balance;Decreased mobility;Impaired sensation;Pain ? ?   ?  ?PT Treatment Interventions DME instruction;Gait training;Stair training;Functional mobility training;Therapeutic activities;Therapeutic exercise;Balance training;Neuromuscular re-education;Patient/family education   ? ?PT Goals (Current goals can be found in the Care Plan section)  ?Acute Rehab PT Goals ?Patient Stated Goal: to improve ?PT Goal Formulation: With patient ?Time For Goal Achievement: 08/12/21 ?Potential to Achieve Goals: Good ? ?  ?Frequency Min 3X/week ?  ? ? ?Co-evaluation   ?  ?  ?  ?  ? ? ?  ?AM-PAC PT "6 Clicks" Mobility  ?Outcome Measure Help needed turning from your back to your side while in a flat bed without using bedrails?: None ?Help needed moving from lying on your back  to sitting on the side of a flat bed without using bedrails?: None ?Help needed moving to and from a bed to a chair (including a wheelchair)?: A Little ?Help needed standing up from a chair using your arms (e.g., wheelchair or bedside chair)?: A Little ?Help needed to walk in hospital room?: A Little ?Help needed climbing 3-5 steps with a railing? : A Little ?6 Click Score: 20 ? ?  ?End of Session    ?Activity Tolerance: Patient tolerated treatment well ?Patient left: in bed;with call bell/phone within reach ?Nurse Communication: Mobility status ?PT Visit Diagnosis: Unsteadiness on feet (R26.81);Other abno

## 2021-07-29 NOTE — Progress Notes (Addendum)
[facility]  ? ?Subjective  - [place] ? ? ?Objective ?118/66 ?92 ?98.6 ?F (37 ?C) (Oral) ?18 ?97% ? ?Intake/Output Summary (Last 24 hours) at 07/29/2021 0829 ?Last data filed at 07/29/2021 0558 ?Gross per 24 hour  ?Intake 4237.33 ml  ?Output 4450 ml  ?Net -212.67 ml  ? ?Doppler PT/DP signal left LE ?Incisions healing well ?Lungs non labored breathing ?Heart RRR ? ? ?Assessment/Planning: ?POD # 1 AK pop to BK TP trunk for left leg severe claudication. ?Improved inflow with doppler signals intact ? ? ?Pending mobility, pain control and independent voiding possible discharge tomorrow ?PT/OT eval pending ?Cont ASA and Plavix  ? ?Juan [name] ?07/29/2021 ?8:29 AM ?-- ? ?Laboratory ?Lab Results: ?Recent Labs  ?  07/28/21 ?0640 07/28/21 ?1237 07/28/21 ?1539 07/29/21 ?0604  ?WBC 8.1  --   --  8.5  ?HGB 14.0   < > 12.2* 11.2*  ?HCT 41.0   < > 36.0* 35.1*  ?PLT 147*  --   --  82*  ? < > = values in this interval not displayed.  ? ?BMET ?Recent Labs  ?  07/28/21 ?0640 07/28/21 ?1237 07/28/21 ?1424 07/28/21 ?1539 07/29/21 ?0604  ?NA 136   < > 138 139 137  ?K 4.5   < > 5.1 4.7 4.2  ?CL 102  --  106  --  104  ?CO2 19*  --   --   --  25  ?GLUCOSE 130*  --  217*  --  161*  ?BUN 63*  --  57*  --  48*  ?CREATININE 1.91*  --  2.00*  --  1.60*  ?CALCIUM 10.6*  --   --   --  9.4  ? < > = values in this interval not displayed.  ? ? ?COAG ?Lab Results  ?Component Value Date  ? INR 1.0 07/28/2021  ? INR 1.0 05/21/2019  ? INR 0.98 03/24/2018  ? ?No results found for: PTT ? ?I have seen and evaluated the patient. I agree with the PA note as documented above.  Postop day 1 status post left SFA to TP trunk bypass.  Brisk Doppler signals in the left foot.  Incisions look good.  Chronic kidney disease and creatinine at 1.6 today around baseline.  Pain seems reasonably well controlled. ? ?Marty Heck, MD ?[facility] ?Office: [phone] ? ? ?

## 2021-07-29 NOTE — Progress Notes (Signed)
MEDICATION RELATED CONSULT NOTE - INITIAL  ? ? ?Allergies  ?Allergen Reactions  ? Reglan [Metoclopramide] Other (See Comments)  ?  Hyperactivity with higher doses (can tolerate lower doses) ?  ? Sglt2 Inhibitors Other (See Comments)  ?  Cellulitis with Vania Rea and Wilder Glade  ? ? ?Patient Measurements: ?Height: '5\' 11"'$  (180.3 cm) ?Weight: 103 kg (227 lb) ?IBW/kg (Calculated) : 75.3 ? ?Vital Signs: ?Temp: 98.1 ?F (36.7 ?C) (05/13 1147) ?Temp Source: Oral (05/13 1147) ?BP: 90/55 (05/13 1147) ?Pulse Rate: 87 (05/13 1147) ?Intake/Output from previous day: ?05/12 0701 - 05/13 0700 ?In: 4237.3 [P.O.:720; I.V.:2917.3; IV Piggyback:600] ?Out: 1761 [Urine:4350; Blood:100] ?Intake/Output from this shift: ?Total I/O ?In: -  ?Out: 500 [Urine:500] ? ?Labs: ?Recent Labs  ?  07/28/21 ?0640 07/28/21 ?1237 07/28/21 ?1424 07/28/21 ?1539 07/29/21 ?0604  ?WBC 8.1  --   --   --  8.5  ?HGB 14.0   < > 13.6 12.2* 11.2*  ?HCT 41.0   < > 40.0 36.0* 35.1*  ?PLT 147*  --   --   --  82*  ?APTT 25  --   --   --   --   ?CREATININE 1.91*  --  2.00*  --  1.60*  ?ALBUMIN 3.5  --   --   --   --   ?PROT 7.2  --   --   --   --   ?AST 55*  --   --   --   --   ?ALT 72*  --   --   --   --   ?ALKPHOS 41  --   --   --   --   ?BILITOT 0.7  --   --   --   --   ? < > = values in this interval not displayed.  ? ?Estimated Creatinine Clearance: 64.5 mL/min (A) (by C-G formula based on SCr of 1.6 mg/dL (H)). ? ? ?Microbiology: ?Recent Results (from the past 720 hour(s))  ?Surgical pcr screen     Status: Abnormal  ? Collection Time: 07/28/21  7:09 AM  ? Specimen: Nasal Mucosa; Nasal Swab  ?Result Value Ref Range Status  ? MRSA, PCR NEGATIVE NEGATIVE Final  ? Staphylococcus aureus POSITIVE (A) NEGATIVE Final  ?  Comment: (NOTE) ?The Xpert SA Assay (FDA approved for NASAL specimens in patients 37 ?years of age and older), is one component of a comprehensive ?surveillance program. It is not intended to diagnose infection nor to ?guide or monitor treatment. ?Performed  at [facility], [place] [address] [place], [place] ?[address] ?  ? ? ?Medical History: ?Past Medical History:  ?Diagnosis Date  ? Abnormal cardiovascular stress test 02/06/2013  ? Anemia   ? occasional - no problems currently(10/02/2011)  ? Bilateral renal cysts 06/16/2011  ? states no known problems  ? Blood transfusion without reported diagnosis   ? CAD (coronary artery disease), LAD 90%, 1st diag 95%, LCX 70% wth AV groove 90%, RCA 40-50% mid and 80% long distal stenosis 02/03/13 02/04/2013  ? Chest pain   ? positive Myoview stress test  ? CHF (congestive heart failure) (Diagonal)   ? Coronary artery calcification seen on CAT scan   ? Diabetes mellitus   ? IDDM  ? Fatty liver disease, nonalcoholic 10/62/6948  ? Hyperlipidemia   ? Hypertension   ? states no dx. of HTN, takes med. to protect kidneys due to DM  ? Lateral meniscus tear 09/2011  ? left  ? Loose body in  knee 09/2011  ? loose bodies left knee  ? Non Hodgkin's lymphoma ([place]) 1991  ? Pancreatitis   ? occasional - last episode 06/2011  ? Pneumonia   ? S/P CABG x 4, 02/05/13 LIMA-LAD; LT. RADIAL-OM;VG-DIAG; VG-PDA 02/06/2013  ? 10/18 3/4 patent grafts (occluded SVG-->Diag), PCI/DESx 3 SVG-->RCA, normal EF  ? Sleep apnea   ? mild per patient  ? Splenomegaly, congestive, chronic   ? Stuffy and runny nose 10/02/2011  ? yellow drainage from nose  ? ? ?Medications:  ?Scheduled:  ? aspirin  81 mg Oral Daily  ? clopidogrel  75 mg Oral Daily  ? docusate sodium  100 mg Oral Daily  ? fenofibrate  160 mg Oral q AM  ? heparin  5,000 Units Subcutaneous Q8H  ? insulin pump   Subcutaneous TID WC, HS, 0200  ? isosorbide mononitrate  30 mg Oral Daily  ? metoprolol succinate  75 mg Oral Q12H  ? pantoprazole  40 mg Oral Daily  ? sacubitril-valsartan  1 tablet Oral BID  ? spironolactone  25 mg Oral Daily  ? torsemide  40 mg Oral BID  ? ? ?Assessment: ?Both type 1 and type 2 diabetes documented in the chart. Upon speaking with the patient he has type 2 diabetes, but developed  severe pancreatitis when he was first diagnosed that caused damage and therefore he has elements of type 1 and is insulin dependent. He takes metformin in addition to using an insulin pump.  ? ? ?Juan [name] ?07/29/2021,3:43 PM ? ? ? ?

## 2021-07-30 LAB — GLUCOSE, CAPILLARY
Glucose-Capillary: 65 mg/dL — ABNORMAL LOW (ref 70–99)
Glucose-Capillary: 131 mg/dL — ABNORMAL HIGH (ref 70–99)
Glucose-Capillary: 244 mg/dL — ABNORMAL HIGH (ref 70–99)
Glucose-Capillary: 90 mg/dL (ref 70–99)
Glucose-Capillary: 93 mg/dL (ref 70–99)

## 2021-07-30 NOTE — Progress Notes (Addendum)
[facility] ? ?Subjective  - doing a little better with time, ambulated some. ? ? ?Objective ?98/65 ?92 ?97.6 ?F (36.4 ?C) (Axillary) ?19 ?99% ? ?Intake/Output Summary (Last 24 hours) at 07/30/2021 0750 ?Last data filed at 07/30/2021 0600 ?Gross per 24 hour  ?Intake --  ?Output 1200 ml  ?Net -1200 ml  ? ? ?Left LE incisions healing well  ?Doppler signal DP/PT intact ?Lungs non labored breathing ? ?Assessment/Planning: ?POD # 2 AK pop to BK TP trunk for left leg severe claudication. ?Improved inflow with doppler signals intact ? ?Chronic LE edema with lymphedema ?Good inflow to the distal DP/PT ?Pending mobility tomorrow possible discharge.  He and his family have concerns about his mobility this am and want to continue with PT to,morrow before they feel comfortable with going home. ?Rolling walker ordered for home. ?Cont ASA and PLavix ? ?Roxy Horseman ?07/30/2021 ?7:50 AM ?-- ? ?Laboratory ?Lab Results: ?Recent Labs  ?  07/28/21 ?0640 07/28/21 ?1237 07/28/21 ?1539 07/29/21 ?0604  ?WBC 8.1  --   --  8.5  ?HGB 14.0   < > 12.2* 11.2*  ?HCT 41.0   < > 36.0* 35.1*  ?PLT 147*  --   --  82*  ? < > = values in this interval not displayed.  ? ?BMET ?Recent Labs  ?  07/28/21 ?0640 07/28/21 ?1237 07/28/21 ?1424 07/28/21 ?1539 07/29/21 ?0604  ?NA 136   < > 138 139 137  ?K 4.5   < > 5.1 4.7 4.2  ?CL 102  --  106  --  104  ?CO2 19*  --   --   --  25  ?GLUCOSE 130*  --  217*  --  161*  ?BUN 63*  --  57*  --  48*  ?CREATININE 1.91*  --  2.00*  --  1.60*  ?CALCIUM 10.6*  --   --   --  9.4  ? < > = values in this interval not displayed.  ? ? ?COAG ?Lab Results  ?Component Value Date  ? INR 1.0 07/28/2021  ? INR 1.0 05/21/2019  ? INR 0.98 03/24/2018  ? ?No results found for: PTT ? ?I have seen and evaluated the patient. I agree with the PA note as documented above.  Postop day 2 status post left SFA to TP trunk bypass.  He has some notable leg swelling but brisk PT and peroneal signal.  Feels he  needs one more day for mobility.  Hopefully discharge tomorrow.  PT recommended outpatient PT.  Aspirin plavix. ? ?Marty Heck, MD ?[facility] ?Office: [phone] ? ? ?

## 2021-07-30 NOTE — Progress Notes (Signed)
Physical Therapy Treatment ?Patient Details ?Name: Juan [name] ?MRN: [id number] ?DOB: [birth year] ?Today's Date: 07/30/2021 ? ? ?History of Present Illness Pt is a 56 y.o. male who presented 07/28/21 for L femoral-below knee popliteal artery bypass graft secondary to L leg severe claudication. PMH: anemia, CAD, CHF, DM, HLD, HTN, lateral meniscus tear, non hodgkin's lymphoma, pancreatitis, s/p CABG x4 02/05/13, splenomegaly ? ?  ?PT Comments  ? ? Pt is making good progress with mobility, ambulating up to ~120 ft today and even taking a few steps without the RW without LOB or need for assistance. Pt continues to remain limited by pain, edema, and L leg ROM restrictions, thus performed standing stretches. Pt with noted improved heel strike today. Will continue to follow acutely. Current recommendations remain appropriate. ? ?   ?Recommendations for follow up therapy are one component of a multi-disciplinary discharge planning process, led by the attending physician.  Recommendations may be updated based on patient status, additional functional criteria and insurance authorization. ? ?Follow Up Recommendations ? Outpatient PT (vs no PT follow-up pending progress) ?  ?  ?Assistance Recommended at Discharge PRN  ?Patient can return home with the following A little help with bathing/dressing/bathroom;Assistance with cooking/housework;Assist for transportation;Help with stairs or ramp for entrance ?  ?Equipment Recommendations ? Rolling walker (2 wheels)  ?  ?Recommendations for Other Services   ? ? ?  ?Precautions / Restrictions Precautions ?Precautions: Fall ?Restrictions ?Weight Bearing Restrictions: No  ?  ? ?Mobility ? Bed Mobility ?Overal bed mobility: Modified Independent ?Bed Mobility: Sit to Supine, Supine to Sit ?  ?  ?Supine to sit: Modified independent (Device/Increase time), HOB elevated ?Sit to supine: Modified independent (Device/Increase time), HOB elevated ?  ?General bed mobility comments: Able to transition  sit <> supine with HOB elevated with extra time. ?  ? ?Transfers ?Overall transfer level: Needs assistance ?Equipment used: Rolling walker (2 wheels) ?Transfers: Sit to/from Stand ?Sit to Stand: Supervision ?  ?  ?  ?  ?  ?General transfer comment: Pt with good power up to stand from EOB 2x, supervision for safety ?  ? ?Ambulation/Gait ?Ambulation/Gait assistance: Supervision ?Gait Distance (Feet): 120 Feet ?Assistive device: Rolling walker (2 wheels), None ?Gait Pattern/deviations: Step-through pattern, Decreased step length - left, Decreased dorsiflexion - left, Decreased stride length, Antalgic, Trunk flexed, Decreased weight shift to left ?Gait velocity: reduced ?Gait velocity interpretation: 1.31 - 2.62 ft/sec, indicative of limited community ambulator ?  ?General Gait Details: Pt with improved speed today, initially displaying poor L weight shift/acceptance and L forefoot contact only with stance but as distance increased his L step length, weight shift/acceptance, and ankle dorsiflexion improved to the point he was obtaining heel strike. No LOB, supervision for safety. x2 rest breaks due to increased WOB and pain. Pt taking a few steps end of session by bed without UE support, no LOB ? ? ?Stairs ?  ?  ?  ?  ?General stair comments: Verbally reviewed sequencing of feet on stairs, good recall by pt from prior session ? ? ?Wheelchair Mobility ?  ? ?Modified Rankin (Stroke Patients Only) ?  ? ? ?  ?Balance Overall balance assessment: Needs assistance ?Sitting-balance support: No upper extremity supported, Feet supported ?Sitting balance-Leahy Scale: Good ?  ?  ?Standing balance support: Bilateral upper extremity supported, During functional activity, No upper extremity supported ?Standing balance-Leahy Scale: Fair ?Standing balance comment: Able to take a few steps without UE support but benefits from RW currently ?  ?  ?  ?  ?  ?  ?  ?  ?  ?  ?  ?  ? ?  ?  Cognition Arousal/Alertness: Awake/alert ?Behavior During  Therapy: [facility] for tasks assessed/performed ?Overall Cognitive Status: Within Functional Limits for tasks assessed ?  ?  ?  ?  ?  ?  ?  ?  ?  ?  ?  ?  ?  ?  ?  ?  ?  ?  ?  ? ?  ?Exercises Other Exercises ?Other Exercises: Self stretch into ankle dorsiflexion standing leaning anteriorly into wall with feet flat, x3 ? ?  ?General Comments   ?  ?  ? ?Pertinent Vitals/Pain Pain Assessment ?Pain Assessment: Faces ?Faces Pain Scale: Hurts even more ?Pain Location: L leg ?Pain Descriptors / Indicators: Discomfort, Grimacing, Operative site guarding ?Pain Intervention(s): Limited activity within patient's tolerance, Monitored during session, Premedicated before session, Repositioned  ? ? ?Home Living   ?  ?  ?  ?  ?  ?  ?  ?  ?  ?   ?  ?Prior Function    ?  ?  ?   ? ?PT Goals (current goals can now be found in the care plan section) Acute Rehab PT Goals ?Patient Stated Goal: to improve ?PT Goal Formulation: With patient ?Time For Goal Achievement: 08/12/21 ?Potential to Achieve Goals: Good ?Progress towards PT goals: Progressing toward goals ? ?  ?Frequency ? ? ? Min 3X/week ? ? ? ?  ?PT Plan Current plan remains appropriate  ? ? ?Co-evaluation   ?  ?  ?  ?  ? ?  ?AM-PAC PT "6 Clicks" Mobility   ?Outcome Measure ? Help needed turning from your back to your side while in a flat bed without using bedrails?: None ?Help needed moving from lying on your back to sitting on the side of a flat bed without using bedrails?: None ?Help needed moving to and from a bed to a chair (including a wheelchair)?: A Little ?Help needed standing up from a chair using your arms (e.g., wheelchair or bedside chair)?: A Little ?Help needed to walk in hospital room?: A Little ?Help needed climbing 3-5 steps with a railing? : A Little ?6 Click Score: 20 ? ?  ?End of Session   ?Activity Tolerance: Patient tolerated treatment well ?Patient left: in bed;with call bell/phone within reach ?Nurse Communication: Mobility status ?PT Visit Diagnosis:  Unsteadiness on feet (R26.81);Other abnormalities of gait and mobility (R26.89);Difficulty in walking, not elsewhere classified (R26.2);Pain ?Pain - Right/Left: Left ?Pain - part of body: Leg ?  ? ? ?Time: 4503-8882 ?PT Time Calculation (min) (ACUTE ONLY): 14 min ? ?Charges:  $Gait Training: 8-22 mins          ?          ? ?Moishe Spice, PT, DPT ?Acute Rehabilitation Services  ?Pager: [phone] ?Office: [phone] ? ? ? ?Maretta Bees Pettis ?07/30/2021, 1:39 PM ? ?

## 2021-07-31 ENCOUNTER — Other Ambulatory Visit (HOSPITAL_COMMUNITY): Payer: Self-pay

## 2021-07-31 LAB — GLUCOSE, CAPILLARY
Glucose-Capillary: 126 mg/dL — ABNORMAL HIGH (ref 70–99)
Glucose-Capillary: 131 mg/dL — ABNORMAL HIGH (ref 70–99)
Glucose-Capillary: 107 mg/dL — ABNORMAL HIGH (ref 70–99)
Glucose-Capillary: 157 mg/dL — ABNORMAL HIGH (ref 70–99)
Glucose-Capillary: 162 mg/dL — ABNORMAL HIGH (ref 70–99)

## 2021-07-31 MED ORDER — OXYCODONE-ACETAMINOPHEN 5-325 MG PO TABS
1.0000 | ORAL_TABLET | Freq: Four times a day (QID) | ORAL | 0 refills | Status: DC | PRN
  Filled 2021-07-31: qty 20, 5d supply, fill #0

## 2021-07-31 NOTE — Discharge Instructions (Signed)
 [facility]  Discharge instructions  Lower Extremity Bypass Surgery  Please refer to the following instruction for your post-procedure care. Your surgeon or physician assistant will discuss any changes with you.  Activity  You are encouraged to walk as much as you can. You can slowly return to normal activities during the month after your surgery. Avoid strenuous activity and heavy lifting until your doctor tells you it's OK. Avoid activities such as vacuuming or swinging a golf club. Do not drive until your doctor give the OK and you are no longer taking prescription pain medications. It is also normal to have difficulty with sleep habits, eating and bowel movement after surgery. These will go away with time.  Bathing/Showering  You may shower after you go home. Do not soak in a bathtub, hot tub, or swim until the incision heals completely.  Incision Care  Clean your incision with mild soap and water. Shower every day. Pat the area dry with a clean towel. You do not need a bandage unless otherwise instructed. Do not apply any ointments or creams to your incision. If you have open wounds you will be instructed how to care for them or a visiting nurse may be arranged for you. If you have staples or sutures along your incision they will be removed at your post-op appointment. You may have skin glue on your incision. Do not peel it off. It will come off on its own in about one week. If you have a great deal of moisture in your groin, use a gauze help keep this area dry.  Diet  Resume your normal diet. There are no special food restrictions following this procedure. A low fat/ low cholesterol diet is recommended for all patients with vascular disease. In order to heal from your surgery, it is CRITICAL to get adequate nutrition. Your body requires vitamins, minerals, and protein. Vegetables are the best source of vitamins and minerals. Vegetables also provide the  perfect balance of protein. Processed food has little nutritional value, so try to avoid this.  Medications  Resume taking all your medications unless your doctor or nurse practitioner tells you not to. If your incision is causing pain, you may take over-the-counter pain relievers such as acetaminophen (Tylenol). If you were prescribed a stronger pain medication, please aware these medication can cause nausea and constipation. Prevent nausea by taking the medication with a snack or meal. Avoid constipation by drinking plenty of fluids and eating foods with high amount of fiber, such as fruits, vegetables, and grains. Take Colase 100 mg (an over-the-counter stool softener) twice a day as needed for constipation. Do not take Tylenol if you are taking prescription pain medications.  Follow Up  Our office will schedule a follow up appointment 2-3 weeks following discharge.  Please call us immediately for any of the following conditions  Severe or worsening pain in your legs or feet while at rest or while walking Increase pain, redness, warmth, or drainage (pus) from your incision site(s) Fever of 101 degree or higher The swelling in your leg with the bypass suddenly worsens and becomes more painful than when you were in the hospital If you have been instructed to feel your graft pulse then you should do so every day. If you can no longer feel this pulse, call the office immediately. Not all patients are given this instruction.  Leg swelling is common after leg bypass surgery.  The swelling should improve over a few months   following surgery. To improve the swelling, you may elevate your legs above the level of your heart while you are sitting or resting. Your surgeon or physician assistant may ask you to apply an ACE wrap or wear compression (TED) stockings to help to reduce swelling.  Reduce your risk of vascular disease  Stop smoking. If you would like help call [facility] at 1-800-QUIT-NOW  (1-800-784-8669) or [place] at [phone].  Manage your cholesterol Maintain a desired weight Control your diabetes weight Control your diabetes Keep your blood pressure down  If you have any questions, please call the office at [phone]   

## 2021-07-31 NOTE — Progress Notes (Signed)
Occupational Therapy Treatment ?Patient Details ?Name: Juan [name] ?MRN: [id number] ?DOB: 07-26-1965 ?Today's Date: 07/31/2021 ? ? ?History of present illness Pt is a 56 y.o. male who presented 07/28/21 for L femoral-below knee popliteal artery bypass graft secondary to L leg severe claudication. PMH: anemia, CAD, CHF, DM, HLD, HTN, lateral meniscus tear, non hodgkin's lymphoma, pancreatitis, s/p CABG x4 02/05/13, splenomegaly ?  ?OT comments ? Pt making excellent progress with OT and all goals. Pt requires assist to donn sock at this time but wife can assist. Pt is toileting and managing all other adls with supervision and is ready for d/c from OT standpoint. Wife to stay home with pt for a day or two and then will return to work. Talked to pt about safety at home with 2 cats as well as energy conservation.   ?  ? ?Recommendations for follow up therapy are one component of a multi-disciplinary discharge planning process, led by the attending physician.  Recommendations may be updated based on patient status, additional functional criteria and insurance authorization. ?   ?Follow Up Recommendations ? No OT follow up  ?  ?Assistance Recommended at Discharge PRN  ?Patient can return home with the following ? Assistance with cooking/housework;Assist for transportation ?  ?Equipment Recommendations ? None recommended by OT  ?  ?Recommendations for Other Services   ? ?  ?Precautions / Restrictions Precautions ?Precautions: Fall ?Restrictions ?Weight Bearing Restrictions: No  ? ? ?  ? ?Mobility Bed Mobility ?Overal bed mobility: Modified Independent ?  ?  ?  ?  ?  ?  ?General bed mobility comments: HoB at 30 degrees. Pt did not use bedrail. ?  ? ?Transfers ?Overall transfer level: Needs assistance ?Equipment used: Rolling walker (2 wheels) ?Transfers: Sit to/from Stand, Bed to chair/wheelchair/BSC ?Sit to Stand: Supervision ?Stand pivot transfers: Supervision ?  ?  ?  ?  ?General transfer comment: supervision for safety ?   ?  ?Balance Overall balance assessment: Needs assistance ?Sitting-balance support: No upper extremity supported, Feet supported ?Sitting balance-Leahy Scale: Good ?  ?  ?Standing balance support: Bilateral upper extremity supported, During functional activity, No upper extremity supported ?Standing balance-Leahy Scale: Fair ?Standing balance comment: Pt uses RW but in tight space like the bathorom at home, pt could take a few steps without the walker. ?  ?  ?  ?  ?  ?  ?  ?  ?  ?  ?  ?   ? ?ADL either performed or assessed with clinical judgement  ? ?ADL Overall ADL's : Needs assistance/impaired ?Eating/Feeding: Independent;Sitting ?  ?Grooming: Wash/dry hands;Wash/dry face;Oral care;Supervision/safety;Standing ?  ?  ?  ?  ?  ?  ?  ?Lower Body Dressing: Minimal assistance;Sit to/from stand ?Lower Body Dressing Details (indicate cue type and reason): assist with socks. talked about sock aid. pt will have wife assist until he can do it on his own. ?Toilet Transfer: Supervision/safety;Rolling walker (2 wheels);Grab bars;Ambulation ?Toilet Transfer Details (indicate cue type and reason): pt walked to bathroom and completed transfers with cues to use hand rails due to low toilet. ?Toileting- Water quality scientist and Hygiene: Supervision/safety;Sit to/from stand ?Toileting - Clothing Manipulation Details (indicate cue type and reason): no physical assis tneeded. ?  ?  ?Functional mobility during ADLs: Supervision/safety;Rolling walker (2 wheels) ?General ADL Comments: Pt making great progress only requiring min assist for socks. ?  ? ?Extremity/Trunk Assessment Upper Extremity Assessment ?Upper Extremity Assessment: Overall WFL for tasks assessed ?  ?Lower Extremity Assessment ?Lower Extremity  Assessment: Defer to PT evaluation ?  ?  ?  ? ?Vision   ?Vision Assessment?: No apparent visual deficits ?Additional Comments: wears glasses ?  ?Perception Perception ?Perception: Within Functional Limits ?  ?Praxis  Praxis ?Praxis: Intact ?  ? ?Cognition Arousal/Alertness: Awake/alert ?Behavior During Therapy: [facility] for tasks assessed/performed ?Overall Cognitive Status: Within Functional Limits for tasks assessed ?  ?  ?  ?  ?  ?  ?  ?  ?  ?  ?  ?  ?  ?  ?  ?  ?  ?  ?  ?   ?Exercises   ? ?  ?Shoulder Instructions   ? ? ?  ?General Comments Pt safe from OT standpoint to handle self at home. Wife to stay home for a day or two then pt will be alone.  ? ? ?Pertinent Vitals/ Pain       Pain Assessment ?Pain Assessment: Faces ?Faces Pain Scale: Hurts little more ?Pain Location: LLE ?Pain Descriptors / Indicators: Discomfort, Grimacing ?Pain Intervention(s): Monitored during session, Repositioned ? ?Home Living   ?  ?  ?  ?  ?  ?  ?  ?  ?  ?  ?  ?  ?  ?  ?  ?  ?  ?  ? ?  ?Prior Functioning/Environment    ?  ?  ?  ?   ? ?Frequency ? Min 2X/week  ? ? ? ? ?  ?Progress Toward Goals ? ?OT Goals(current goals can now be found in the care plan section) ? Progress towards OT goals: Progressing toward goals ? ?Acute Rehab OT Goals ?Patient Stated Goal: to go home soon ?OT Goal Formulation: With patient ?Time For Goal Achievement: 08/11/21 ?Potential to Achieve Goals: Good ?ADL Goals ?Pt Will Perform Lower Body Dressing: with modified independence;sit to/from stand ?Pt Will Transfer to Toilet: with modified independence;ambulating;regular height toilet  ?Plan Discharge plan remains appropriate   ? ?Co-evaluation ? ? ?   ?  ?  ?  ?  ? ?  ?AM-PAC OT "6 Clicks" Daily Activity     ?Outcome Measure ? ? Help from another person eating meals?: None ?Help from another person taking care of personal grooming?: None ?Help from another person toileting, which includes using toliet, bedpan, or urinal?: None ?Help from another person bathing (including washing, rinsing, drying)?: A Little ?Help from another person to put on and taking off regular upper body clothing?: None ?Help from another person to put on and taking off regular lower body clothing?: A  Little ?6 Click Score: 22 ? ?  ?End of Session Equipment Utilized During Treatment: Rolling walker (2 wheels) ? ?OT Visit Diagnosis: Unsteadiness on feet (R26.81);Pain ?Pain - Right/Left: Left ?Pain - part of body: Leg ?  ?Activity Tolerance Patient tolerated treatment well ?  ?Patient Left in chair;with call bell/phone within reach;with family/visitor present ?  ?Nurse Communication Mobility status ?  ? ?   ? ?Time: 3382-5053 ?OT Time Calculation (min): 16 min ? ?Charges: OT General Charges ?$OT Visit: 1 Visit ?OT Treatments ?$Self Care/Home Management : 8-22 mins ? ? ? ?Glenford Peers ?07/31/2021, 9:46 AM ?

## 2021-07-31 NOTE — Progress Notes (Signed)
Physical Therapy Treatment ?Patient Details ?Name: Juan [name] ?MRN: [id number] ?DOB: 1965-08-27 ?Today's Date: 07/31/2021 ? ? ?History of Present Illness Pt is a 56 y.o. male who presented 07/28/21 for L femoral-below knee popliteal artery bypass graft secondary to L leg severe claudication. PMH: anemia, CAD, CHF, DM, HLD, HTN, lateral meniscus tear, non hodgkin's lymphoma, pancreatitis, s/p CABG x4 02/05/13, splenomegaly ? ?  ?PT Comments  ? ? Pt in recliner on arrival. Supervision transfers, supervision amb in room without AD, supervision amb 225' with RW, and min guard assist ascend/descend 7 steps with rails. Pt reports LLE pain 5/10 during gait. 1/4 DOE. Max HR 106. Pt in bed at end of session.  ?   ?Recommendations for follow up therapy are one component of a multi-disciplinary discharge planning process, led by the attending physician.  Recommendations may be updated based on patient status, additional functional criteria and insurance authorization. ? ?Follow Up Recommendations ? Outpatient PT ?  ?  ?Assistance Recommended at Discharge PRN  ?Patient can return home with the following A little help with bathing/dressing/bathroom;Assistance with cooking/housework;Assist for transportation;Help with stairs or ramp for entrance ?  ?Equipment Recommendations ? Rolling walker (2 wheels)  ?  ?Recommendations for Other Services   ? ? ?  ?Precautions / Restrictions Precautions ?Precautions: Fall  ?  ? ?Mobility ? Bed Mobility ?Overal bed mobility: Modified Independent ?  ?  ?  ?  ?  ?  ?  ?  ? ?Transfers ?Overall transfer level: Needs assistance ?Equipment used: Ambulation equipment used ?Transfers: Sit to/from Stand, Bed to chair/wheelchair/BSC ?Sit to Stand: Supervision ?  ?Step pivot transfers: Supervision ?  ?  ?  ?General transfer comment: supervision for safety ?  ? ?Ambulation/Gait ?Ambulation/Gait assistance: Supervision ?Gait Distance (Feet): 225 Feet ?Assistive device: Rolling walker (2 wheels), None ?Gait  Pattern/deviations: Step-to pattern, Step-through pattern, Decreased weight shift to left, Antalgic, Decreased dorsiflexion - left ?Gait velocity: decreased ?Gait velocity interpretation: 1.31 - 2.62 ft/sec, indicative of limited community ambulator ?  ?General Gait Details: Amb in room without AD. RW for hallway amb. Improved heel strike on L as gait distance progressed. Max HR 106. 1/4 DOE ? ? ?Stairs ?Stairs: Yes ?Stairs assistance: Min guard ?Stair Management: Two rails, Step to pattern, Forwards ?Number of Stairs: 7 ?General stair comments: cues for seqeuncing, no physical assist ? ? ?Wheelchair Mobility ?  ? ?Modified Rankin (Stroke Patients Only) ?  ? ? ?  ?Balance Overall balance assessment: Needs assistance ?Sitting-balance support: No upper extremity supported, Feet supported ?Sitting balance-Leahy Scale: Good ?  ?  ?Standing balance support: Bilateral upper extremity supported, During functional activity, No upper extremity supported ?Standing balance-Leahy Scale: Fair ?  ?  ?  ?  ?  ?  ?  ?  ?  ?  ?  ?  ?  ? ?  ?Cognition Arousal/Alertness: Awake/alert ?Behavior During Therapy: [facility] for tasks assessed/performed ?Overall Cognitive Status: Within Functional Limits for tasks assessed ?  ?  ?  ?  ?  ?  ?  ?  ?  ?  ?  ?  ?  ?  ?  ?  ?  ?  ?  ? ?  ?Exercises   ? ?  ?General Comments   ?  ?  ? ?Pertinent Vitals/Pain Pain Assessment ?Pain Assessment: 0-10 ?Pain Score: 5  ?Pain Location: LLE ?Pain Descriptors / Indicators: Discomfort, Grimacing, Operative site guarding ?Pain Intervention(s): Monitored during session, Repositioned  ? ? ?Home Living   ?  ?  ?  ?  ?  ?  ?  ?  ?  ?   ?  ?  Prior Function    ?  ?  ?   ? ?PT Goals (current goals can now be found in the care plan section) Acute Rehab PT Goals ?Patient Stated Goal: walk better ?Progress towards PT goals: Progressing toward goals ? ?  ?Frequency ? ? ? Min 3X/week ? ? ? ?  ?PT Plan Current plan remains appropriate  ? ? ?Co-evaluation   ?  ?  ?  ?  ? ?   ?AM-PAC PT "6 Clicks" Mobility   ?Outcome Measure ? Help needed turning from your back to your side while in a flat bed without using bedrails?: None ?Help needed moving from lying on your back to sitting on the side of a flat bed without using bedrails?: None ?Help needed moving to and from a bed to a chair (including a wheelchair)?: A Little ?Help needed standing up from a chair using your arms (e.g., wheelchair or bedside chair)?: A Little ?Help needed to walk in hospital room?: A Little ?Help needed climbing 3-5 steps with a railing? : A Little ?6 Click Score: 20 ? ?  ?End of Session Equipment Utilized During Treatment: Gait belt ?Activity Tolerance: Patient tolerated treatment well ?Patient left: in bed;with call bell/phone within reach ?Nurse Communication: Mobility status ?PT Visit Diagnosis: Unsteadiness on feet (R26.81);Other abnormalities of gait and mobility (R26.89);Difficulty in walking, not elsewhere classified (R26.2);Pain ?Pain - Right/Left: Left ?Pain - part of body: Leg ?  ? ? ?Time: 8182-9937 ?PT Time Calculation (min) (ACUTE ONLY): 14 min ? ?Charges:  $Gait Training: 8-22 mins          ?          ? ?Lorrin Goodell, PT  ?Office # [phone] ?Pager [phone] ? ? ? ?Lorriane Shire ?07/31/2021, 8:28 AM ? ?

## 2021-07-31 NOTE — TOC Transition Note (Signed)
Transition of Care (TOC) - CM/SW Discharge Note ?Marvetta Gibbons Therapist, sports, BSN ?Transitions of Care ?Unit 4E- RN Case Manager ?See Treatment Team for direct phone #  ? ? ?Patient Details  ?Name: Juan [name] ?MRN: [id number] ?Date of Birth: 30-Dec-1965 ? ?Transition of Care (TOC) CM/SW Contact:  ?Dahlia Client, Romeo Rabon, RN ?Phone Number: ?07/31/2021, 1:49 PM ? ? ?Clinical Narrative:    ?Pt stable for transition home today, noted recommendations for outpt PT and DME-RW. CM spoke with pt at bedside. Pt confirmed he needs RW for home- ok with in house provider. Choice offered for outpt PT location- pt agreeable to [facility] Location.  ? ?Referral made in epic for outpt PT at [facility]. Location. ? ?Pt has transportation home.  ? ?Call made to [employer] for DME need- RW to be delivered to room prior to discharge.  ? ? ?Final next level of care: OP Rehab ?Barriers to Discharge: No Barriers Identified ? ? ?Patient Goals and CMS Choice ?Patient states their goals for this hospitalization and ongoing recovery are:: return home ?CMS Medicare.gov Compare Post Acute Care list provided to:: Patient ?Choice offered to / list presented to : Patient ? ?Discharge Placement ?  ?           ? Home ?  ?  ?  ? ?Discharge Plan and Services ?  ?Discharge Planning Services: CM Consult ?Post Acute Care Choice: Durable Medical Equipment          ?DME Arranged: Walker rolling ?DME Agency: [employer] ?Date DME Agency Contacted: 07/31/21 ?Time DME Agency Contacted: [phone] ?Representative spoke with at DME Agency: Mardene Celeste ?HH Arranged: NA ?[place] Agency: NA ?  ?  ?  ? ?Social Determinants of Health (SDOH) Interventions ?  ? ? ?Readmission Risk Interventions ? ?  07/31/2021  ?  1:49 PM  ?Readmission Risk Prevention Plan  ?Transportation Screening Complete  ?PCP or Specialist Appt within 5-7 Days Complete  ?Home Care Screening Complete  ?Medication Review (RN CM) Complete  ? ? ? ? ? ?

## 2021-07-31 NOTE — Progress Notes (Signed)
?  Progress Note ? ? ? ?07/31/2021 ?7:43 AM ?3 Days Post-Op ? ?Subjective:  No new complaints ? ? ?Vitals:  ? 07/31/21 0451 07/31/21 0729  ?BP: 102/65 95/67  ?Pulse: 84 89  ?Resp: 20 20  ?Temp: 98.7 ?F (37.1 ?C) 97.8 ?F (36.6 ?C)  ?SpO2: 94% 98%  ? ?Physical Exam: ?Lungs:  non labored ?Incisions:  LLE incisions c/d/i ?Extremities:  L foot with brisk PT signal ?Neurologic: A&O ? ?CBC ?   ?Component Value Date/Time  ? WBC 8.5 07/29/2021 0604  ? RBC 4.20 (L) 07/29/2021 0604  ? HGB 11.2 (L) 07/29/2021 0604  ? HGB 14.1 02/09/2020 1554  ? HGB 13.5 by plasma replacement for lipemia 09/26/2015 0917  ? HCT 35.1 (L) 07/29/2021 0604  ? HCT 41.2 02/09/2020 1554  ? HCT 38.4 09/26/2015 0917  ? PLT 82 (L) 07/29/2021 0604  ? PLT 147 (L) 02/09/2020 1554  ? MCV 83.6 07/29/2021 0604  ? MCV 76 (L) 02/09/2020 1554  ? MCV 77.7 (L) 09/26/2015 0917  ? MCH 26.7 07/29/2021 0604  ? MCHC 31.9 07/29/2021 0604  ? RDW 16.2 (H) 07/29/2021 0604  ? RDW 17.8 (H) 02/09/2020 1554  ? RDW 17.0 (H) 09/26/2015 4680  ? LYMPHSABS 1.8 07/28/2020 1526  ? LYMPHSABS 2.1 12/14/2016 1534  ? LYMPHSABS 1.9 09/26/2015 0917  ? MONOABS 0.6 07/28/2020 1526  ? MONOABS 0.5 09/26/2015 0917  ? EOSABS 0.4 07/28/2020 1526  ? EOSABS 0.4 12/14/2016 1534  ? BASOSABS 0.2 (H) 07/28/2020 1526  ? BASOSABS 0.1 12/14/2016 1534  ? BASOSABS 0.2 (H) 09/26/2015 3212  ? ? ?BMET ?   ?Component Value Date/Time  ? NA 137 07/29/2021 0604  ? NA 133 (L) 02/09/2020 1554  ? NA 128 (L) 09/26/2015 2482  ? K 4.2 07/29/2021 0604  ? K 4.9 09/26/2015 0917  ? CL 104 07/29/2021 0604  ? CL 104 08/25/2012 0936  ? CO2 25 07/29/2021 0604  ? CO2 15 (L) 09/26/2015 0917  ? GLUCOSE 161 (H) 07/29/2021 0604  ? GLUCOSE 307 (H) 09/26/2015 5003  ? GLUCOSE 125 (H) 08/25/2012 0936  ? BUN 48 (H) 07/29/2021 0604  ? BUN 60 (H) 02/09/2020 1554  ? BUN 28.4 (H) 09/26/2015 7048  ? CREATININE 1.60 (H) 07/29/2021 0604  ? CREATININE 1.3 09/26/2015 0917  ? CALCIUM 9.4 07/29/2021 0604  ? CALCIUM 11.3 (H) 09/26/2015 8891  ? GFRNONAA  51 (L) 07/29/2021 0604  ? GFRAA 60 02/09/2020 1554  ? ? ?INR ?   ?Component Value Date/Time  ? INR 1.0 07/28/2021 0640  ? ? ? ?Intake/Output Summary (Last 24 hours) at 07/31/2021 0743 ?Last data filed at 07/31/2021 0153 ?Gross per 24 hour  ?Intake 360 ml  ?Output 170 ml  ?Net 190 ml  ? ? ? ?Assessment/Plan:  56 y.o. male is s/p L AK pop to TP trunk bypass 3 Days Post-Op  ? ?L foot well perfused based on exam ?Incisions are healing well ?Chronic LLE edema with lymphedema; encouraged him to elevate legs when not out of bed ?No HH recommendations from therapy; patient would like to work with therapy again today before possible discharge home this afternoon  ? ? ?Dagoberto Ligas, PA-C ?[facility] ?[phone] ?07/31/2021 ?7:43 AM ? ?\ ?

## 2021-08-01 LAB — TYPE AND SCREEN
ABO/RH(D): AB NEG
Antibody Screen: POSITIVE
DAT, IgG: POSITIVE
Unit division: 0
Unit division: 0

## 2021-08-01 LAB — BPAM RBC
Blood Product Expiration Date: 202305262359
Blood Product Expiration Date: 202306012359
Unit Type and Rh: 600
Unit Type and Rh: 600

## 2021-08-02 DIAGNOSIS — E210 Primary hyperparathyroidism: Secondary | ICD-10-CM | POA: Insufficient documentation

## 2021-08-02 NOTE — Discharge Summary (Signed)
Bypass Discharge Summary Patient ID: Juan [name] [id number] 55 y.o. [birth year]  Admit date: 07/28/2021  Discharge date and time: 07/31/2021  3:59 PM   Admitting Physician: Serafina Mitchell, MD   Discharge Physician: same  Admission Diagnoses: PAD (peripheral artery disease) [facility]) [I73.9]  Discharge Diagnoses: same  Admission Condition: fair  Discharged Condition: fair  Indication for Admission: Postoperative care  Hospital Course: Juan [name] is a 56 year old male who was brought in as an outpatient and underwent left distal SFA to TP trunk bypass with vein by Dr. Trula Slade on 07/28/2021 due to severe lifestyle limiting claudication of the left lower extremity.  He tolerated the procedure well and was admitted to the hospital postoperatively.  His hospital course was uneventful and mostly consisted of postoperative pain control and increasing mobility.  At the time of discharge he had brisk Doppler signals in the left leg.  Patient has chronic edema of the left leg however incisions appear to be healing well at the time of discharge.  He will follow-up in 2 to 3 weeks for incision check.  He was prescribed 2 to 3 days of narcotic pain medication for continued postoperative pain control.  Therapy teams did not recommend any home health or placement.  He was discharged home in stable condition.  Consults: None  Treatments: surgery: Left distal SFA to TP trunk bypass with vein by Dr. Trula Slade on 07/28/2021    Disposition: Discharge disposition: 01-Home or Self Care       - For [facility] Registry use ---  Post-op:  Wound infection: No  Graft infection: No  Transfusion: No   New Arrhythmia: No Patency judged by: [ x] Dopper only, '[ ]'$  Palpable graft pulse, '[ ]'$  Palpable distal pulse, '[ ]'$  ABI inc. > 0.15, '[ ]'$  Duplex D/C Ambulatory Status: Ambulatory  Complications: MI: [ x] No, '[ ]'$  Troponin only, '[ ]'$  EKG or Clinical CHF: No Resp failure: [ x] none, '[ ]'$  Pneumonia, '[ ]'$   Ventilator Chg in renal function: [x ] none, '[ ]'$  Inc. Cr > 0.5, '[ ]'$  Temp. Dialysis, '[ ]'$  Permanent dialysis Stroke: [ x] None, '[ ]'$  Minor, '[ ]'$  Major Return to OR: No  Reason for return to OR: '[ ]'$  Bleeding, '[ ]'$  Infection, '[ ]'$  Thrombosis, '[ ]'$  Revision  Discharge medications: Statin use:  Yes ASA use:  Yes Plavix use:  Yes Beta blocker use: Yes Coumadin use: No  for medical reason not indicated    Patient Instructions:  Allergies as of 07/31/2021       Reactions   Reglan [metoclopramide] Other (See Comments)   Hyperactivity with higher doses (can tolerate lower doses)   Sglt2 Inhibitors Other (See Comments)   Cellulitis with Jardiance and Farxiga        Medication List     TAKE these medications    aspirin 81 MG chewable tablet Chew 1 tablet (81 mg total) by mouth daily.   BAYER CONTOUR NEXT TEST VI glucose testing   clopidogrel 75 MG tablet Commonly known as: PLAVIX TAKE 1 TABLET BY MOUTH EVERY DAY WITH BREAKFAST   Dexcom G6 Receiver Devi See admin instructions.   Entresto 49-51 MG Generic drug: sacubitril-valsartan Take 1 tablet by mouth 2 (two) times daily.   fenofibrate 160 MG tablet Take 160 mg by mouth in the morning.   insulin regular human CONCENTRATED 500 UNIT/ML injection Commonly known as: HUMULIN R Inject into the skin continuous. Using insulin pump   isosorbide mononitrate 30 MG 24  hr tablet Commonly known as: IMDUR TAKE 1 TABLET BY MOUTH EVERY DAY   metFORMIN 1000 MG tablet Commonly known as: GLUCOPHAGE Take 1,000 mg by mouth 2 (two) times daily with a meal.   metoprolol succinate 25 MG 24 hr tablet Commonly known as: TOPROL-XL TAKE 3 TABLETS BY MOUTH TWICE A DAY WITH OR IMMEDIATELY FOLLOWING A MEAL   nitroGLYCERIN 0.4 MG SL tablet Commonly known as: Nitrostat Place 1 tablet (0.4 mg total) under the tongue every 5 (five) minutes as needed.   oxyCODONE-acetaminophen 5-325 MG tablet Commonly known as: PERCOCET/ROXICET Take 1 tablet by  mouth every 6 (six) hours as needed for moderate pain or severe pain.   spironolactone 25 MG tablet Commonly known as: ALDACTONE TAKE 1 TABLET (25 MG TOTAL) BY MOUTH DAILY.   Systane Balance 0.6 % Soln Generic drug: Propylene Glycol Place 1 drop into both eyes 4 (four) times daily as needed (dry eyes).   torsemide 20 MG tablet Commonly known as: DEMADEX Take 2 tablets (40 mg total) by mouth 2 (two) times daily.   Tyler Aas FlexTouch 200 UNIT/ML FlexTouch Pen Generic drug: insulin degludec Inject 36 Units into the skin in the morning.   Vascepa 1 g capsule Generic drug: icosapent Ethyl TAKE 2 CAPSULES BY MOUTH TWICE A DAY       Activity: activity as tolerated Diet: regular diet Wound Care: keep wound clean and dry  Follow-up with [facility] in 3 weeks.  SignedDagoberto Ligas 08/02/2021 12:40 PM

## 2021-08-04 ENCOUNTER — Other Ambulatory Visit: Payer: Self-pay | Admitting: Physician Assistant

## 2021-08-04 ENCOUNTER — Telehealth: Payer: Self-pay

## 2021-08-04 ENCOUNTER — Telehealth: Payer: Self-pay | Admitting: *Deleted

## 2021-08-04 MED ORDER — OXYCODONE-ACETAMINOPHEN 5-325 MG PO TABS: 1.0000 | ORAL_TABLET | Freq: Four times a day (QID) | ORAL | 0 refills | Status: DC | PRN

## 2021-08-04 NOTE — Telephone Encounter (Signed)
I reached out to Pt regarding Short Term Dis. paperwork. I advised pt the paperwork has been completed, faxed & successful fax confirmation was received. Patient voiced his understanding.

## 2021-08-04 NOTE — Telephone Encounter (Signed)
Patient called c/o pain in incisions in left leg and groin. Denies fever, redness, however slight drainage is noted.  I instructed patient to elevate leg on pillow when not ambulating. Patient requested additional pain medication.  I spoke to Dagoberto Ligas  PA.  He sent request for pain medication to pharmacy.  Patient voiced understanding of the instructions.

## 2021-08-07 ENCOUNTER — Encounter (HOSPITAL_COMMUNITY)
Admission: RE | Admit: 2021-08-07 | Discharge: 2021-08-07 | Disposition: A | Discharge status: Home / Self Care | Payer: Commercial Managed Care - PPO | Source: Ambulatory Visit | Attending: Endocrinology | Admitting: Endocrinology

## 2021-08-07 MED ORDER — TECHNETIUM TC 99M SESTAMIBI GENERIC - CARDIOLITE
26.8000 | Freq: Once | INTRAVENOUS | Status: AC | PRN
  Administered 2021-08-07: 26.8 via INTRAVENOUS

## 2021-08-09 ENCOUNTER — Ambulatory Visit: Payer: Commercial Managed Care - PPO | Attending: Physician Assistant

## 2021-08-09 DIAGNOSIS — R2689 Other abnormalities of gait and mobility: Secondary | ICD-10-CM | POA: Insufficient documentation

## 2021-08-09 DIAGNOSIS — M79605 Pain in left leg: Secondary | ICD-10-CM | POA: Insufficient documentation

## 2021-08-09 DIAGNOSIS — M6281 Muscle weakness (generalized): Secondary | ICD-10-CM | POA: Insufficient documentation

## 2021-08-09 NOTE — Therapy (Signed)
OUTPATIENT PHYSICAL THERAPY LOWER EXTREMITY EVALUATION   Patient Name: Juan [name] MRN: [id number] DOB:11/22/1965, 56 y.o., male Today's Date: 08/09/2021   PT End of Session - 08/09/21 1441     Visit Number 1    Date for PT Re-Evaluation 09/06/21    Authorization Type [employer]    PT Start Time 8341    PT Stop Time 9622    PT Time Calculation (min) 45 min    Activity Tolerance Patient tolerated treatment well    Behavior During Therapy [facility] for tasks assessed/performed             Past Medical History:  Diagnosis Date   Abnormal cardiovascular stress test 02/06/2013   Anemia    occasional - no problems currently(10/02/2011)   Bilateral renal cysts 06/16/2011   states no known problems   Blood transfusion without reported diagnosis    CAD (coronary artery disease), LAD 90%, 1st diag 95%, LCX 70% wth AV groove 90%, RCA 40-50% mid and 80% long distal stenosis 02/03/13 02/04/2013   Chest pain    positive Myoview stress test   CHF (congestive heart failure) (HCC)    Coronary artery calcification seen on CAT scan    Diabetes mellitus    IDDM   Fatty liver disease, nonalcoholic 29/79/8921   Hyperlipidemia    Hypertension    states no dx. of HTN, takes med. to protect kidneys due to DM   Lateral meniscus tear 09/2011   left   Loose body in knee 09/2011   loose bodies left knee   Non Hodgkin's lymphoma ([place]) 1991   Pancreatitis    occasional - last episode 06/2011   Pneumonia    S/P CABG x 4, 02/05/13 LIMA-LAD; LT. RADIAL-OM;VG-DIAG; VG-PDA 02/06/2013   10/18 3/4 patent grafts (occluded SVG-->Diag), PCI/DESx 3 SVG-->RCA, normal EF   Sleep apnea    mild per patient   Splenomegaly, congestive, chronic    Stuffy and runny nose 10/02/2011   yellow drainage from nose   Past Surgical History:  Procedure Laterality Date   ABDOMINAL AORTIC ANEURYSM REPAIR     ABDOMINAL AORTOGRAM W/LOWER EXTREMITY N/A 07/18/2021   Procedure: ABDOMINAL AORTOGRAM W/LOWER EXTREMITY;  Surgeon:  Serafina Mitchell, MD;  Location: [place] [facility];  Service: Cardiovascular;  Laterality: N/A;   ANTERIOR CERVICAL DECOMP/DISCECTOMY FUSION N/A 03/26/2018   Procedure: ANTERIOR CERVICAL DECOMPRESSION FUSION CERVICAL 5-6 WITH INSTRUMENTATION AND ALLOGRAFT;  Surgeon: Phylliss Bob, MD;  Location: [place];  Service: Orthopedics;  Laterality: N/A;   CARDIAC CATHETERIZATION     CORONARY ARTERY BYPASS GRAFT N/A 02/05/2013   Procedure: CORONARY ARTERY BYPASS GRAFTING (CABG);  Surgeon: Ivin Poot, MD;  Location: [place];  Service: Open Heart Surgery;  Laterality: N/A;  Coronary artery bypass graft on pump times four using left internal mammary artery and right greater saphenous vein via endovein harvest and left radial artery harvest.    CORONARY STENT INTERVENTION  12/17/2016    PCI and drug-eluting stenting of the mid and distal RCA SVG    CORONARY STENT INTERVENTION N/A 12/17/2016   Procedure: CORONARY STENT INTERVENTION;  Surgeon: Lorretta Harp, MD;  Location: [place] [facility];  Service: Cardiovascular;  Laterality: N/A;   ELBOW SURGERY     FEMORAL-POPLITEAL BYPASS GRAFT Left 07/28/2021   Procedure: LEFT FEMORAL-BELOW KNEE POPLITEAL ARTERY BYPASS GRAFT;  Surgeon: Serafina Mitchell, MD;  [facility];  Service: Vascular;  Laterality: Left;   HERNIA REPAIR     inguinal  herniated disc     INTRAOPERATIVE TRANSESOPHAGEAL ECHOCARDIOGRAM N/A 02/05/2013   Procedure: INTRAOPERATIVE TRANSESOPHAGEAL ECHOCARDIOGRAM;  Surgeon: Ivin Poot, MD;  Location: [place];  Service: Open Heart Surgery;  Laterality: N/A;   KNEE ARTHROSCOPY  10/09/2011   Procedure: ARTHROSCOPY KNEE;  Surgeon: Nita Sells, MD;  Location: [place];  Service: Orthopedics;  Laterality: Left;   LEFT HEART CATH AND CORS/GRAFTS ANGIOGRAPHY N/A 12/17/2016   Procedure: LEFT HEART CATH AND CORS/GRAFTS ANGIOGRAPHY;  Surgeon: Lorretta Harp, MD;  Location: [place] [facility];  Service: Cardiovascular;   Laterality: N/A;   LEFT HEART CATHETERIZATION WITH CORONARY ANGIOGRAM N/A 02/03/2013   Procedure: LEFT HEART CATHETERIZATION WITH CORONARY ANGIOGRAM;  Surgeon: Lorretta Harp, MD;  Location: [facility];  Service: Cardiovascular;  Laterality: N/A;   NASAL SEPTUM SURGERY     RADIAL ARTERY HARVEST Left 02/05/2013   Procedure: RADIAL ARTERY HARVEST;  Surgeon: Ivin Poot, MD;  Location: [place];  Service: Vascular;  Laterality: Left;   RIGHT/LEFT HEART CATH AND CORONARY/GRAFT ANGIOGRAPHY N/A 02/15/2020   Procedure: RIGHT/LEFT HEART CATH AND CORONARY/GRAFT ANGIOGRAPHY;  Surgeon: Lorretta Harp, MD;  Location: [name] [facility];  Service: Cardiovascular;  Laterality: N/A;   TEE WITHOUT CARDIOVERSION N/A 02/18/2020   Procedure: TRANSESOPHAGEAL ECHOCARDIOGRAM (TEE);  Surgeon: Jolaine Artist, MD;  Location: [facility];  Service: Cardiovascular;  Laterality: N/A;   TIBIA BONE BIOPSY  x 3   left   TONSILLECTOMY     VEIN HARVEST Left 07/28/2021   Procedure: HARVEST OF GREAT SAPHENOUS VEIN;  Surgeon: Serafina Mitchell, MD;  Location: [facility];  Service: Vascular;  Laterality: Left;   Patient Active Problem List   Diagnosis Date Noted   Primary hyperparathyroidism ([place]) 08/02/2021   PAD (peripheral artery disease) ([place]) 07/28/2021   Chronic renal impairment 12/30/2020   Hyperglycemia due to type 2 diabetes mellitus ([place]) 12/30/2020   Hypertensive retinopathy of both eyes 12/30/2020   Presence of insulin pump (external) (internal) 12/30/2020   Proteinuria 52/84/1324   Systolic heart failure ([place]) 12/30/2020   Cellulitis 07/21/2020   Chronic combined systolic and diastolic CHF (congestive heart failure) ([place]) 02/15/2020   Moderate nonproliferative diabetic retinopathy of left eye with macular edema associated with diabetes mellitus due to underlying condition ([place]) 07/30/2019   Moderate nonproliferative diabetic retinopathy of right eye without macular edema associated with type 2 diabetes  mellitus ([place]) 07/30/2019   Retinal macroaneurysm of left eye 07/30/2019   HTN (hypertension)    Sepsis ([place])    Cellulitis of right leg    Hyponatremia    Lactic acidosis    Elevated troponin    Radiculopathy 03/26/2018   Right bundle branch block 01/07/2018   Displacement of cervical intervertebral disc 12/17/2017   Chest pain 40/12/2723   Diastolic dysfunction 36/64/4034   Edema of both legs 02/03/2016   Dyspnea on exertion 02/03/2016   Lipodystrophy 08/24/2015   S/P CABG x 4 02/06/2013   CAD (coronary artery disease) 02/04/2013   Other malignant lymphomas, unspecified site, extranodal and solid organ sites 08/25/2012   Personal history of lymphatic and hematopoietic neoplasm 06/05/2012   Personal history of digestive disease 06/05/2012   H/O non-Hodgkin's lymphoma 06/05/2012   Hyperlipidemia 06/04/2012   Chronic nonalcoholic liver disease 74/25/9563   Chylomicronemia syndrome 06/04/2012   Diabetes type 2, uncontrolled 06/04/2012   Encounter for long-term (current) use of medications 06/04/2012   Thrombocytopenia ([place]) 06/17/2011   Chronic pancreatitis ([place]) 06/16/2011   Dyslipidemia 06/16/2011  Hyperkalemia 06/16/2011   Acute kidney injury ([place]) 06/16/2011   Dehydration 06/16/2011   Type 2 diabetes mellitus treated with insulin ([place]) 06/16/2011   Microcytosis 06/16/2011   Fatty liver disease, nonalcoholic 88/28/0034   Bilateral renal cysts 06/16/2011   Splenomegaly 06/16/2011    PCP: Kathalene Frames, MD  REFERRING PROVIDER: Dagoberto Ligas, PA-C  REFERRING DIAG: s/p  L AK pop to TP trunk bypass   THERAPY DIAG: decreased functional mobility   Rationale for Evaluation and Treatment Rehabilitation  ONSET DATE: 07/28/21  SUBJECTIVE:   SUBJECTIVE STATEMENT: LLE tired today today from an active schedule   PERTINENT HISTORY: Pt is a 56 y.o. male who presented 07/28/21 for L femoral-below knee popliteal artery bypass graft secondary to L leg severe  claudication. PMH: anemia, CAD, CHF, DM, HLD, HTN, lateral meniscus tear, non hodgkin's lymphoma, pancreatitis, s/p CABG x4 02/05/13, splenomegaly   PAIN:  Are you having pain? Yes: NPRS scale: 4/10 Pain location: LLE graft sites Pain description: sharp Aggravating factors: activity Relieving factors: rest  PRECAUTIONS: Fall  WEIGHT BEARING RESTRICTIONS No  FALLS:  Has patient fallen in last 6 months? No  LIVING ENVIRONMENT: Lives with: lives with their family Lives in: House/apartment Stairs:  yes Has following equipment at home: None  OCCUPATION: Therapist, nutritional  PLOF: Independent  PATIENT GOALS To reduce and manage    OBJECTIVE:   DIAGNOSTIC FINDINGS: none available  PATIENT SURVEYS:  FOTO not setup  COGNITION:  Overall cognitive status: Within functional limits for tasks assessed     SENSATION: WFL  MUSCLE LENGTH: Hamstrings: Right 60 deg; Left 50 deg   POSTURE: No Significant postural limitations   LOWER EXTREMITY ROM:  Active ROM Right eval Left eval  Hip flexion    Hip extension    Hip abduction    Hip adduction    Hip internal rotation    Hip external rotation    Knee flexion  115d  Knee extension  -5d  Ankle dorsiflexion    Ankle plantarflexion    Ankle inversion    Ankle eversion     (Blank rows = not tested)  LOWER EXTREMITY MMT:  MMT Right eval Left eval  Hip flexion    Hip extension    Hip abduction    Hip adduction    Hip internal rotation    Hip external rotation    Knee flexion  4-  Knee extension  4-  Ankle dorsiflexion    Ankle plantarflexion    Ankle inversion    Ankle eversion     (Blank rows = not tested)  LOWER EXTREMITY SPECIAL TESTS:  N/A  FUNCTIONAL TESTS:  5 times sit to stand: 15s  GAIT: Distance walked: 28f x2 Assistive device utilized: None Level of assistance: Complete Independence  TODAY'S TREATMENT: Eval and HEP   PATIENT EDUCATION:  Education details: Discussed eval findings, rehab  rationale and POC and patient is in agreement  Person educated: Patient Education method: Explanation, Demonstration, and Handouts Education comprehension: verbalized understanding, returned demonstration, and needs further education   HOME EXERCISE PROGRAM: Access Code: [email or web address]: [email or web address] Date: 08/09/2021 Prepared by: JSharlynn Oliphant Exercises - Supine Active Straight Leg Raise  - 2 x daily - 7 x weekly - 2 sets - 10 reps - Supine Heel Slide  - 2 x daily - 7 x weekly - 2 sets - 10 reps - Seated Long Arc Quad  - 2 x daily - 7 x weekly - 2 sets - 10 reps -  Seated Hamstring Stretch  - 2 x daily - 7 x weekly - 2 sets - 10 reps - Heel Toe Raises with Counter Support  - 2 x daily - 7 x weekly - 2 sets - 10 reps  ASSESSMENT:  CLINICAL IMPRESSION: Patient is a 56 y.o. male who was seen today for physical therapy evaluation and treatment for post-op weakness and ROM deficits following bypass graft.  Pain is minimal, BLE edema noted(chronic issue).  Healing well w/o redness, drainage or warmth noted.  Flexibility deficits note in B hamstrings as well as mild strength deficits in L knee.  Swelling in LLE limits DF slightly.   OBJECTIVE IMPAIRMENTS Abnormal gait, decreased activity tolerance, decreased knowledge of condition, decreased mobility, decreased ROM, decreased strength, impaired flexibility, and pain.   ACTIVITY LIMITATIONS carrying, lifting, squatting, and stairs  PARTICIPATION LIMITATIONS: driving, community activity, and occupation  [place], Past/current experiences, and Time since onset of injury/illness/exacerbation are also affecting patient's functional outcome.   REHAB POTENTIAL: Good  CLINICAL DECISION MAKING: Stable/uncomplicated  EVALUATION COMPLEXITY: Low   GOALS: Goals reviewed with patient? Yes  SHORT TERM GOALS: STGs=LTGs    LONG TERM GOALS: Target date: 09/06/2021   Patient to demonstrate independence in  HEP  Baseline: [id number] Goal status: INITIAL  2.  Increase L knee ROM to 0d extension, 125d flexion Baseline: -5d and 115d respectively Goal status: INITIAL  3.  4/5 LLE strength in knee Baseline: 4-/5 Goal status: INITIAL  4.  Obtain LEFS Baseline: TBD Goal status: INITIAL  5.  Decrease 5x STS time to 12s Baseline: 15s Goal status: INITIAL   PLAN: PT FREQUENCY: 1x/week  PT DURATION: 4 weeks  PLANNED INTERVENTIONS: Therapeutic exercises, Therapeutic activity, Neuromuscular re-education, Balance training, Gait training, Patient/Family education, Joint mobilization, Stair training, and Re-evaluation  PLAN FOR NEXT SESSION: HEP review, aerobic work, ROM, strength and stretch, functional tasks, monitor healing   Lanice Shirts, PT 08/09/2021, 2:42 PM

## 2021-08-15 ENCOUNTER — Ambulatory Visit: Payer: Commercial Managed Care - PPO

## 2021-08-15 ENCOUNTER — Ambulatory Visit (INDEPENDENT_AMBULATORY_CARE_PROVIDER_SITE_OTHER): Payer: Commercial Managed Care - PPO | Admitting: Physician Assistant

## 2021-08-15 VITALS — BP 97/55 | HR 85 | Temp 98.2°F | Resp 18 | Ht 71.0 in | Wt 230.6 lb

## 2021-08-15 DIAGNOSIS — M6281 Muscle weakness (generalized): Secondary | ICD-10-CM | POA: Diagnosis not present

## 2021-08-15 DIAGNOSIS — M79605 Pain in left leg: Secondary | ICD-10-CM

## 2021-08-15 DIAGNOSIS — I70212 Atherosclerosis of native arteries of extremities with intermittent claudication, left leg: Secondary | ICD-10-CM

## 2021-08-15 DIAGNOSIS — R2689 Other abnormalities of gait and mobility: Secondary | ICD-10-CM

## 2021-08-15 NOTE — Therapy (Addendum)
OUTPATIENT PHYSICAL THERAPY TREATMENT NOTE/DC SUMMARY   Patient Name: Juan [name] MRN: [id number] DOB:09/05/1965, 56 y.o., male Today's Date: 08/15/2021  PCP: Henderson, Jonathan A, MD REFERRING PROVIDER: Eveland, Matthew, PA-C PHYSICAL THERAPY DISCHARGE SUMMARY  Visits from Start of Care: 2  Current functional level related to goals / functional outcomes: UTA   Remaining deficits: UTA   Education / Equipment: HEP   Patient agrees to discharge. Patient goals were partially met. Patient is being discharged due to not returning since the last visit.  END OF SESSION:   PT End of Session - 08/15/21 1307     Visit Number 2    Number of Visits 4    Date for PT Re-Evaluation 09/06/21    Authorization Type [employer]    PT Start Time 1315    PT Stop Time 1355    PT Time Calculation (min) 40 min    Activity Tolerance Patient tolerated treatment well    Behavior During Therapy WFL for tasks assessed/performed             Past Medical History:  Diagnosis Date   Abnormal cardiovascular stress test 02/06/2013   Anemia    occasional - no problems currently(10/02/2011)   Bilateral renal cysts 06/16/2011   states no known problems   Blood transfusion without reported diagnosis    CAD (coronary artery disease), LAD 90%, 1st diag 95%, LCX 70% wth AV groove 90%, RCA 40-50% mid and 80% long distal stenosis 02/03/13 02/04/2013   Chest pain    positive Myoview stress test   CHF (congestive heart failure) (HCC)    Coronary artery calcification seen on CAT scan    Diabetes mellitus    IDDM   Fatty liver disease, nonalcoholic 06/16/2011   Hyperlipidemia    Hypertension    states no dx. of HTN, takes med. to protect kidneys due to DM   Lateral meniscus tear 09/2011   left   Loose body in knee 09/2011   loose bodies left knee   Non Hodgkin's lymphoma (HCC) 1991   Pancreatitis    occasional - last episode 06/2011   Pneumonia    S/P CABG x 4, 02/05/13 LIMA-LAD; LT. RADIAL-OM;VG-DIAG;  VG-PDA 02/06/2013   10/18 3/4 patent grafts (occluded SVG-->Diag), PCI/DESx 3 SVG-->RCA, normal EF   Sleep apnea    mild per patient   Splenomegaly, congestive, chronic    Stuffy and runny nose 10/02/2011   yellow drainage from nose   Past Surgical History:  Procedure Laterality Date   ABDOMINAL AORTIC ANEURYSM REPAIR     ABDOMINAL AORTOGRAM W/LOWER EXTREMITY N/A 07/18/2021   Procedure: ABDOMINAL AORTOGRAM W/LOWER EXTREMITY;  Surgeon: Brabham, Vance W, MD;  Location: [facility];  Service: Cardiovascular;  Laterality: N/A;   ANTERIOR CERVICAL DECOMP/DISCECTOMY FUSION N/A 03/26/2018   Procedure: ANTERIOR CERVICAL DECOMPRESSION FUSION CERVICAL 5-6 WITH INSTRUMENTATION AND ALLOGRAFT;  Surgeon: Dumonski, Mark, MD;  Location: [facility];  Service: Orthopedics;  Laterality: N/A;   CARDIAC CATHETERIZATION     CORONARY ARTERY BYPASS GRAFT N/A 02/05/2013   Procedure: CORONARY ARTERY BYPASS GRAFTING (CABG);  Surgeon: Peter Van Trigt, MD;  Location: [facility];  Service: Open Heart Surgery;  Laterality: N/A;  Coronary artery bypass graft on pump times four using left internal mammary artery and right greater saphenous vein via endovein harvest and left radial artery harvest.    CORONARY STENT INTERVENTION  12/17/2016    PCI and drug-eluting stenting of the mid and distal RCA SVG    CORONARY   STENT INTERVENTION N/A 12/17/2016   Procedure: CORONARY STENT INTERVENTION;  Surgeon: Lorretta Harp, MD;  Location: [place] [facility];  Service: Cardiovascular;  Laterality: N/A;   ELBOW SURGERY     FEMORAL-POPLITEAL BYPASS GRAFT Left 07/28/2021   Procedure: LEFT FEMORAL-BELOW KNEE POPLITEAL ARTERY BYPASS GRAFT;  Surgeon: Serafina Mitchell, MD;  Location: [place];  Service: Vascular;  Laterality: Left;   HERNIA REPAIR     inguinal    herniated disc     INTRAOPERATIVE TRANSESOPHAGEAL ECHOCARDIOGRAM N/A 02/05/2013   Procedure: INTRAOPERATIVE TRANSESOPHAGEAL ECHOCARDIOGRAM;  Surgeon: Ivin Poot, MD;  Location:  [place];  Service: Open Heart Surgery;  Laterality: N/A;   KNEE ARTHROSCOPY  10/09/2011   Procedure: ARTHROSCOPY KNEE;  Surgeon: Nita Sells, MD;  Location: [place];  Service: Orthopedics;  Laterality: Left;   LEFT HEART CATH AND CORS/GRAFTS ANGIOGRAPHY N/A 12/17/2016   Procedure: LEFT HEART CATH AND CORS/GRAFTS ANGIOGRAPHY;  Surgeon: Lorretta Harp, MD;  Location: [place] [facility];  Service: Cardiovascular;  Laterality: N/A;   LEFT HEART CATHETERIZATION WITH CORONARY ANGIOGRAM N/A 02/03/2013   Procedure: LEFT HEART CATHETERIZATION WITH CORONARY ANGIOGRAM;  Surgeon: Lorretta Harp, MD;  Location: [facility] CATH LAB;  Service: Cardiovascular;  Laterality: N/A;   NASAL SEPTUM SURGERY     RADIAL ARTERY HARVEST Left 02/05/2013   Procedure: RADIAL ARTERY HARVEST;  Surgeon: Ivin Poot, MD;  Location: [place];  Service: Vascular;  Laterality: Left;   RIGHT/LEFT HEART CATH AND CORONARY/GRAFT ANGIOGRAPHY N/A 02/15/2020   Procedure: RIGHT/LEFT HEART CATH AND CORONARY/GRAFT ANGIOGRAPHY;  Surgeon: Lorretta Harp, MD;  Location: [place] [facility];  Service: Cardiovascular;  Laterality: N/A;   TEE WITHOUT CARDIOVERSION N/A 02/18/2020   Procedure: TRANSESOPHAGEAL ECHOCARDIOGRAM (TEE);  Surgeon: Jolaine Artist, MD;  Location: [facility];  Service: Cardiovascular;  Laterality: N/A;   TIBIA BONE BIOPSY  x 3   left   TONSILLECTOMY     VEIN HARVEST Left 07/28/2021   Procedure: HARVEST OF GREAT SAPHENOUS VEIN;  Surgeon: Serafina Mitchell, MD;  Location: [facility];  Service: Vascular;  Laterality: Left;   Patient Active Problem List   Diagnosis Date Noted   Primary hyperparathyroidism ([place]) 08/02/2021   PAD (peripheral artery disease) ([place]) 07/28/2021   Chronic renal impairment 12/30/2020   Hyperglycemia due to type 2 diabetes mellitus ([place]) 12/30/2020   Hypertensive retinopathy of both eyes 12/30/2020   Presence of insulin pump (external) (internal) 12/30/2020   Proteinuria  96/22/2979   Systolic heart failure ([place]) 12/30/2020   Cellulitis 07/21/2020   Chronic combined systolic and diastolic CHF (congestive heart failure) ([place]) 02/15/2020   Moderate nonproliferative diabetic retinopathy of left eye with macular edema associated with diabetes mellitus due to underlying condition ([place]) 07/30/2019   Moderate nonproliferative diabetic retinopathy of right eye without macular edema associated with type 2 diabetes mellitus ([place]) 07/30/2019   Retinal macroaneurysm of left eye 07/30/2019   HTN (hypertension)    Sepsis ([place])    Cellulitis of right leg    Hyponatremia    Lactic acidosis    Elevated troponin    Radiculopathy 03/26/2018   Right bundle branch block 01/07/2018   Displacement of cervical intervertebral disc 12/17/2017   Chest pain 89/21/1941   Diastolic dysfunction 74/10/1446   Edema of both legs 02/03/2016   Dyspnea on exertion 02/03/2016   Lipodystrophy 08/24/2015   S/P CABG x 4 02/06/2013   CAD (coronary artery disease) 02/04/2013   Other malignant lymphomas, unspecified  site, extranodal and solid organ sites 08/25/2012   Personal history of lymphatic and hematopoietic neoplasm 06/05/2012   Personal history of digestive disease 06/05/2012   H/O non-Hodgkin's lymphoma 06/05/2012   Hyperlipidemia 06/04/2012   Chronic nonalcoholic liver disease 06/04/2012   Chylomicronemia syndrome 06/04/2012   Diabetes type 2, uncontrolled 06/04/2012   Encounter for long-term (current) use of medications 06/04/2012   Thrombocytopenia (HCC) 06/17/2011   Chronic pancreatitis (HCC) 06/16/2011   Dyslipidemia 06/16/2011   Hyperkalemia 06/16/2011   Acute kidney injury (HCC) 06/16/2011   Dehydration 06/16/2011   Type 2 diabetes mellitus treated with insulin (HCC) 06/16/2011   Microcytosis 06/16/2011   Fatty liver disease, nonalcoholic 06/16/2011   Bilateral renal cysts 06/16/2011   Splenomegaly 06/16/2011    REFERRING DIAG: s/p  L AK pop to TP trunk bypass    THERAPY DIAG: decreased functional mobility   Rationale for Evaluation and Treatment Rehabilitation  PERTINENT HISTORY: Pt is a 55 y.o. male who presented 07/28/21 for L femoral-below knee popliteal artery bypass graft secondary to L leg severe claudication. PMH: anemia, CAD, CHF, DM, HLD, HTN, lateral meniscus tear, non hodgkin's lymphoma, pancreatitis, s/p CABG x4 02/05/13, splenomegaly   PRECAUTIONS: Fall  SUBJECTIVE: Had a post-op f/u this AM due to drainage, leg wrapped and will return in 1 week to re-assess.  PAIN:  Are you having pain? Yes: NPRS scale: 4/10 Pain location: L leg  Pain description: ache Aggravating factors: prolonged and dependent positions Relieving factors: activity   OBJECTIVE: (objective measures completed at initial evaluation unless otherwise dated)   OBJECTIVE:    DIAGNOSTIC FINDINGS: none available   PATIENT SURVEYS:  FOTO not setup   COGNITION:           Overall cognitive status: Within functional limits for tasks assessed                          SENSATION: WFL   MUSCLE LENGTH: Hamstrings: Right 60 deg; Left 50 deg     POSTURE: No Significant postural limitations     LOWER EXTREMITY ROM:   Active ROM Right eval Left eval  Hip flexion      Hip extension      Hip abduction      Hip adduction      Hip internal rotation      Hip external rotation      Knee flexion   115d  Knee extension   -5d  Ankle dorsiflexion      Ankle plantarflexion      Ankle inversion      Ankle eversion       (Blank rows = not tested)   LOWER EXTREMITY MMT:   MMT Right eval Left eval  Hip flexion      Hip extension      Hip abduction      Hip adduction      Hip internal rotation      Hip external rotation      Knee flexion   4-  Knee extension   4-  Ankle dorsiflexion      Ankle plantarflexion      Ankle inversion      Ankle eversion       (Blank rows = not tested)   LOWER EXTREMITY SPECIAL TESTS:  N/A   FUNCTIONAL TESTS:  5  times sit to stand: 15s   GAIT: Distance walked: 75ft x2 Assistive device utilized: None Level of assistance: Complete Independence     TODAY'S TREATMENT: [facility] Adult PT Treatment:                                                DATE: 08/15/21 Therapeutic Exercise: Nustep L2 8 min Seated table hamstring stretch 30s x3 QS's 3s x15 SLR 15x SAQs 15x Clams 15x Bridge 15x Heel slides 15x(over slide board) Supine march 15/15 Curl ups 15x Sidelie abduction L 15x LAQs seated L 15x Wall marching 15/15 Wall DF/PF 15 ea.      PATIENT EDUCATION:  Education details: Discussed eval findings, rehab rationale and POC and patient is in agreement  Person educated: Patient Education method: Explanation, Demonstration, and Handouts Education comprehension: verbalized understanding, returned demonstration, and needs further education     HOME EXERCISE PROGRAM: Access Code: [email or web address] URL: [email or web address] Date: 08/09/2021 Prepared by:     Exercises - Supine Active Straight Leg Raise  - 2 x daily - 7 x weekly - 2 sets - 10 reps - Supine Heel Slide  - 2 x daily - 7 x weekly - 2 sets - 10 reps - Seated Long Arc Quad  - 2 x daily - 7 x weekly - 2 sets - 10 reps - Seated Hamstring Stretch  - 2 x daily - 7 x weekly - 2 sets - 10 reps - Heel Toe Raises with Counter Support  - 2 x daily - 7 x weekly - 2 sets - 10 reps   ASSESSMENT:   CLINICAL IMPRESSION: Continued drainage in LLE, addressed by surgeon this AM.  Todays session focused on continuing to strengthen and stretch LLE.  Added aerobic work, reviewed HEP and added additional tasks.  More mobility observed in LLE but measurements not taken     OBJECTIVE IMPAIRMENTS Abnormal gait, decreased activity tolerance, decreased knowledge of condition, decreased mobility, decreased ROM, decreased strength, impaired flexibility, and pain.    ACTIVITY LIMITATIONS carrying, lifting, squatting, and stairs   PARTICIPATION  LIMITATIONS: driving, community activity, and occupation   PERSONAL FACTORS Fitness, Past/current experiences, and Time since onset of injury/illness/exacerbation are also affecting patient's functional outcome.    REHAB POTENTIAL: Good   CLINICAL DECISION MAKING: Stable/uncomplicated   EVALUATION COMPLEXITY: Low     GOALS: Goals reviewed with patient? Yes   SHORT TERM GOALS: STGs=LTGs       LONG TERM GOALS: Target date: 09/06/2021    Patient to demonstrate independence in HEP  Baseline: [id number] Goal status: INITIAL   2.  Increase L knee ROM to 0d extension, 125d flexion Baseline: -5d and 115d respectively Goal status: INITIAL   3.  4/5 LLE strength in knee Baseline: 4-/5 Goal status: INITIAL   4.  Obtain LEFS Baseline: TBD Goal status: INITIAL   5.  Decrease 5x STS time to 12s Baseline: 15s Goal status: INITIAL     PLAN: PT FREQUENCY: 1x/week   PT DURATION: 4 weeks   PLANNED INTERVENTIONS: Therapeutic exercises, Therapeutic activity, Neuromuscular re-education, Balance training, Gait training, Patient/Family education, Joint mobilization, Stair training, and Re-evaluation   PLAN FOR NEXT SESSION: HEP review, aerobic work, ROM, strength and stretch, functional tasks, monitor healing, measure L knee ROM     M , PT 08/15/2021, 1:07 PM     

## 2021-08-15 NOTE — Progress Notes (Signed)
POST OPERATIVE OFFICE NOTE    CC:  F/u for surgery  HPI:  This is a 56 y.o. male who has history of left LE life limiting claudication at short distances.  He is  s/p Left distal superficial femoral artery to tibioperoneal trunk bypass graft with ipsilateral nonreversed saphenous vein on 07/28/21 by Dr. Trula Slade.    Pt returns today for follow up.  Pt states he has developed bloody drainage from the distal bypass incision and has had increased lower extremity edema since surgery.  He does have chronic edema due to CHF in B LE.  He does state that his foot feels better and is pleased over all with his surgical results.     Allergies  Allergen Reactions   Reglan [Metoclopramide] Other (See Comments)    Hyperactivity with higher doses (can tolerate lower doses)    Sglt2 Inhibitors Other (See Comments)    Cellulitis with Jardiance and Farxiga    Current Outpatient Medications  Medication Sig Dispense Refill   aspirin 81 MG chewable tablet Chew 1 tablet (81 mg total) by mouth daily.     clopidogrel (PLAVIX) 75 MG tablet TAKE 1 TABLET BY MOUTH EVERY DAY WITH BREAKFAST 90 tablet 3   Continuous Blood Gluc Receiver (DEXCOM G6 RECEIVER) DEVI See admin instructions.     fenofibrate 160 MG tablet Take 160 mg by mouth in the morning.     Glucose Blood (BAYER CONTOUR NEXT TEST VI) glucose testing     insulin regular human CONCENTRATED (HUMULIN R) 500 UNIT/ML injection Inject into the skin continuous. Using insulin pump     isosorbide mononitrate (IMDUR) 30 MG 24 hr tablet TAKE 1 TABLET BY MOUTH EVERY DAY 90 tablet 3   metFORMIN (GLUCOPHAGE) 1000 MG tablet Take 1,000 mg by mouth 2 (two) times daily with a meal.     metoprolol succinate (TOPROL-XL) 25 MG 24 hr tablet TAKE 3 TABLETS BY MOUTH TWICE A DAY WITH OR IMMEDIATELY FOLLOWING A MEAL 540 tablet 3   nitroGLYCERIN (NITROSTAT) 0.4 MG SL tablet Place 1 tablet (0.4 mg total) under the tongue every 5 (five) minutes as needed. 25 tablet 2    oxyCODONE-acetaminophen (PERCOCET/ROXICET) 5-325 MG tablet Take 1 tablet by mouth every 6 (six) hours as needed for moderate pain or severe pain. 10 tablet 0   Propylene Glycol (SYSTANE BALANCE) 0.6 % SOLN Place 1 drop into both eyes 4 (four) times daily as needed (dry eyes).     sacubitril-valsartan (ENTRESTO) 49-51 MG Take 1 tablet by mouth 2 (two) times daily. 60 tablet 6   spironolactone (ALDACTONE) 25 MG tablet TAKE 1 TABLET (25 MG TOTAL) BY MOUTH DAILY. 90 tablet 3   torsemide (DEMADEX) 20 MG tablet Take 2 tablets (40 mg total) by mouth 2 (two) times daily. 120 tablet 5   TRESIBA FLEXTOUCH 200 UNIT/ML FlexTouch Pen Inject 36 Units into the skin in the morning.     VASCEPA 1 g capsule TAKE 2 CAPSULES BY MOUTH TWICE A DAY 360 capsule 4   No current facility-administered medications for this visit.     ROS:  See HPI  Physical Exam:    Incision:  No incisional dehiscence noted.  Dark bloody drainage at distal bypass incision.   Extremities:  Brisk doppler DP/PT and intact peroneal signals.  Sensation intact and motor.  He is ambulatory.    Lungs: non labored breathing     Assessment/Plan:  This is a 56 y.o. male who is s/p:Left distal superficial femoral artery to  tibioperoneal trunk bypass graft with ipsilateral nonreversed saphenous vein on 07/28/21 by Dr. Trula Slade.    He does not have erythema and is afebrile.  He has good inflow with patent bypass demonstrated by brisk doppler signals.  DR. Stanford Breed came into the room for reassurance and we will place dry dressings over the distal incision and ace wrap for edema control from the toes to the knee.  He will change the dressing as needed.  Plan for him to follow up for incision check in 1 week.       Roxy Horseman PA-C [facility] [phone]   Clinic MD:  Carlis Abbott

## 2021-08-18 ENCOUNTER — Other Ambulatory Visit: Payer: Self-pay | Admitting: Cardiovascular Disease

## 2021-08-18 ENCOUNTER — Ambulatory Visit (INDEPENDENT_AMBULATORY_CARE_PROVIDER_SITE_OTHER): Payer: Commercial Managed Care - PPO | Admitting: Vascular Surgery

## 2021-08-18 ENCOUNTER — Encounter: Payer: Self-pay | Admitting: Vascular Surgery

## 2021-08-18 VITALS — BP 117/69 | HR 93 | Temp 98.1°F | Resp 16 | Ht 70.0 in | Wt 230.0 lb

## 2021-08-18 DIAGNOSIS — I70212 Atherosclerosis of native arteries of extremities with intermittent claudication, left leg: Secondary | ICD-10-CM

## 2021-08-18 MED ORDER — AMOXICILLIN-POT CLAVULANATE 500-125 MG PO TABS: 1.0000 | ORAL_TABLET | Freq: Three times a day (TID) | ORAL | 0 refills | Status: AC

## 2021-08-18 NOTE — Progress Notes (Signed)
  Office Note     HPI: Juan [name] is a 56 y.o. (05-26-1965) male presenting postop for wound check after appreciating some serous drainage.  Patient is status post 07/28/2021 left distal superficial femoral artery to tibioperoneal trunk bypass with ipsilateral nonreversed greater saphenous vein.  He has been doing well physical therapy but in the last couple days has appreciated some drainage at the BK pop cutdown site.  This is nonpurulent.  No associated fevers or chills. Pain to palpation.  PHYSICAL EXAMINATION:  Vitals:   08/18/21 1016  BP: 117/69  Pulse: 93  Resp: 16  Temp: 98.1 F (36.7 C)  TempSrc: Temporal  Weight: 230 lb (104.3 kg)  Height: '5\' 10"'$  (1.778 m)    General:  WDWN in NAD; vital signs documented above Gait: Not observed HENT: WNL, normocephalic Pulmonary: normal non-labored breathing , without wheezing Cardiac: regular HR, Abdomen: soft, NT, no masses Skin: without rashes Vascular Exam/Pulses:  Right Left  Radial 2+ (normal) 2+ (normal)              DP  Doppler signal  PT  Multiphasic signal   Extremities: without ischemic changes, without Gangrene , with cellulitis;  open wounds;  Some erythema appreciated surrounding the below-knee popliteal artery cutdown site.  Serous drainage.  No purulence. Musculoskeletal: no muscle wasting or atrophy  Neurologic: A&O X 3;  No focal weakness or paresthesias are detected Psychiatric:  The pt has Normal affect.   ASSESSMENT/PLAN: Juan [name] is a 56 y.o. male presenting with serous drainage and mild erythema surrounding the left below-knee popliteal artery cutdown site. Bypass functioning appropriately.  I placed [name] on Augmentin for 7 days, and asked him to keep his current appointment with Dr. Trula Slade on Tuesday of next week. I asked that he continue his current medication regimen.   [name], MD [facility] [phone]

## 2021-08-20 NOTE — Progress Notes (Unsigned)
POST OPERATIVE OFFICE NOTE    CC:  F/u for surgery  HPI:  This is a 56 y.o. male who is s/p Left distal superficial femoral artery to tibioperoneal trunk bypass graft with ipsilateral nonreversed saphenous vein on 07/28/21 by Dr. Trula Slade.     He was seen on 6/2 for follow up with Dr. Virl Cagey. At the time he had some bloody drainage from the distal bypass incision and has had increased lower extremity edema since surgery.  He was given instructions for dry dressings with ACE wrap as needed to help with edema. He returns today for close follow up.   Says he is having pain all the way up the leg because " its infected". Pain on ambulation and standing. Continued bloody drainage from wound. Cleaning leg with baby wipes. Says he was not given cleaning instructions at last visit and was too uncomfortable to shower. Denies any fever or chills. Has been taking Augmentin as prescribed  Allergies  Allergen Reactions   Reglan [Metoclopramide] Other (See Comments)    Hyperactivity with higher doses (can tolerate lower doses)    Sglt2 Inhibitors Other (See Comments)    Cellulitis with Vania Rea and Farxiga    Current Outpatient Medications  Medication Sig Dispense Refill   amoxicillin-clavulanate (AUGMENTIN) 500-125 MG tablet Take 1 tablet (500 mg total) by mouth 3 (three) times daily for 7 days. 21 tablet 0   aspirin 81 MG chewable tablet Chew 1 tablet (81 mg total) by mouth daily.     clopidogrel (PLAVIX) 75 MG tablet TAKE 1 TABLET BY MOUTH EVERY DAY WITH BREAKFAST 90 tablet 0   Continuous Blood Gluc Receiver (DEXCOM G6 RECEIVER) DEVI See admin instructions.     fenofibrate 160 MG tablet Take 160 mg by mouth in the morning.     Glucose Blood (BAYER CONTOUR NEXT TEST VI) glucose testing     insulin regular human CONCENTRATED (HUMULIN R) 500 UNIT/ML injection Inject into the skin continuous. Using insulin pump     isosorbide mononitrate (IMDUR) 30 MG 24 hr tablet TAKE 1 TABLET BY MOUTH EVERY DAY 90  tablet 3   metFORMIN (GLUCOPHAGE) 1000 MG tablet Take 1,000 mg by mouth 2 (two) times daily with a meal.     metoprolol succinate (TOPROL-XL) 25 MG 24 hr tablet TAKE 3 TABLETS BY MOUTH TWICE A DAY WITH OR IMMEDIATELY FOLLOWING A MEAL 540 tablet 3   nitroGLYCERIN (NITROSTAT) 0.4 MG SL tablet Place 1 tablet (0.4 mg total) under the tongue every 5 (five) minutes as needed. 25 tablet 2   oxyCODONE-acetaminophen (PERCOCET/ROXICET) 5-325 MG tablet Take 1 tablet by mouth every 6 (six) hours as needed for moderate pain or severe pain. 10 tablet 0   Propylene Glycol (SYSTANE BALANCE) 0.6 % SOLN Place 1 drop into both eyes 4 (four) times daily as needed (dry eyes).     sacubitril-valsartan (ENTRESTO) 49-51 MG Take 1 tablet by mouth 2 (two) times daily. 60 tablet 6   spironolactone (ALDACTONE) 25 MG tablet TAKE 1 TABLET (25 MG TOTAL) BY MOUTH DAILY. 90 tablet 3   torsemide (DEMADEX) 20 MG tablet Take 2 tablets (40 mg total) by mouth 2 (two) times daily. 120 tablet 5   TRESIBA FLEXTOUCH 200 UNIT/ML FlexTouch Pen Inject 36 Units into the skin in the morning.     VASCEPA 1 g capsule TAKE 2 CAPSULES BY MOUTH TWICE A DAY 360 capsule 4   No current facility-administered medications for this visit.     ROS:  See HPI  Physical Exam:  Vitals:   08/22/21 1507  BP: 96/64  Pulse: 84  Resp: 18  Temp: 98 F (36.7 C)  TempSrc: Temporal  SpO2: 97%  Weight: 230 lb (104.3 kg)  Height: '5\' 10"'$  (1.778 m)   General: well appearing, well  nourished Incision:  left below knee incision dehiscence.    Extremities:  LLE well perfused and warm  Neuro: alert and oriented  Assessment/Plan:  This is a 56 y.o. male who is s/p:  Left distal superficial femoral artery to tibioperoneal trunk bypass graft with ipsilateral nonreversed saphenous vein on 07/28/21 by Dr. Trula Slade.  LLE is well perfused. Left below knee incision with some infection and dehiscence of incision. Patient has history of Non Hodgkin's lymphoma with  radiation to his left knee in 1990. Suspect that secondary to this as well as CHF and PAD that his tissue is not very healthy in this area leading to wound healing problems. - Patient seen with Dr. Trula Slade. He opened up wound and debrided wound. Wound packed with Kerlix and then wrapped in ACE - Will arrange [facility] RN to assist with BID packing changes to left leg - Rx for 14 days of Bactrim sent to patients pharmacy - He will follow up in 1 week for wound check. Can be Seen on MD or PA schedule but Dr. Trula Slade will see patient   Karoline Caldwell, PA-C [facility] [phone]  Clinic MD:  Carlis Abbott

## 2021-08-21 ENCOUNTER — Ambulatory Visit: Payer: Commercial Managed Care - PPO

## 2021-08-22 ENCOUNTER — Ambulatory Visit (INDEPENDENT_AMBULATORY_CARE_PROVIDER_SITE_OTHER): Payer: Commercial Managed Care - PPO | Admitting: Physician Assistant

## 2021-08-22 VITALS — BP 96/64 | HR 84 | Temp 98.0°F | Resp 18 | Ht 70.0 in | Wt 230.0 lb

## 2021-08-22 DIAGNOSIS — I70212 Atherosclerosis of native arteries of extremities with intermittent claudication, left leg: Secondary | ICD-10-CM

## 2021-08-22 MED ORDER — SULFAMETHOXAZOLE-TRIMETHOPRIM 800-160 MG PO TABS: 1.0000 | ORAL_TABLET | Freq: Two times a day (BID) | ORAL | 0 refills | Status: DC

## 2021-08-23 ENCOUNTER — Telehealth: Payer: Self-pay

## 2021-08-23 NOTE — Telephone Encounter (Signed)
Pt presented to the office requesting info on home health referral - advised the pt that I had reached out to [place] with [facility] on yesterday afternoon to try to get [facility] facilitated. I advised pt that Lattie Haw emailed me today stating that the insurance was out of network and I would have to reach out to other agencies to assist. Pt stated that he and his wife were not shown how to dress the wound and it was bleeding through his dressing and bandage. I took the pt to the exam room and undressed the wound on his L calf. The dressing that was removed was the same one applied yesterday in office. Packing was removed and contained sanguinous drainage. As I repacked the wound I gave instruction on exactly how to clean and pack the wound. Wound was covered with an abdominal pad then wrapped with kerlix and an ace wrap. Pt was given supplies including wound cleaner and normal saline. Pt tolerated well and he and his wife verbalized understanding of how to dress the wound. Made pt aware that I would contact other agencies and let him know when one has agreed to take on his care. Pt verbalized understanding and agreed with this plan.

## 2021-08-24 ENCOUNTER — Telehealth: Payer: Self-pay

## 2021-08-24 NOTE — Telephone Encounter (Signed)
Contacted pt to update on referral progress - advised pt that I have not found an agency that would accept his care at this point. I advised the pt that I had called [facility], [facility], [facility], [facility] and [facility]. All declined and [facility] is pending. I made pt aware that his wife may have to continue doing dressing changes if I'm unable to find an agency to accept. He verbalized understanding.

## 2021-08-28 ENCOUNTER — Ambulatory Visit: Payer: Commercial Managed Care - PPO

## 2021-08-28 NOTE — Telephone Encounter (Signed)
Spoke with [place] at [facility] in [place] [phone]).  She advised that [facility] is not able to take the referral because the "caseworker in that area" has no availability. Spoke with the patient.  He said that his wife contacted the patient advocate for his insurance company who contacted 6 home health agencies - none could accept his referral.  The patient requests appointments in the office periodically to assess the wound for healing.  Of note:  the patient was scheduled for a PA visit this morning at 10:20 that was confirmed on 6/8.  He says that he may have missed the fact that he was scheduled for that appointment when he was here on 6/6.

## 2021-08-30 ENCOUNTER — Ambulatory Visit: Payer: Commercial Managed Care - PPO

## 2021-08-31 NOTE — Progress Notes (Signed)
POST OPERATIVE OFFICE NOTE    CC:  F/u for surgery  HPI:  This is a 56 y.o. male who is s/p  Left distal superficial femoral artery to tibioperoneal trunk bypass graft with ipsilateral nonreversed saphenous vein on 07/28/21 by Dr. Trula Slade.  LLE is well perfused. Left below knee incision with some infection and dehiscence of incision. Patient has history of Non Hodgkin's lymphoma with radiation to his left knee in 1990. Suspect that secondary to this as well as CHF and PAD that his tissue is not very healthy in this area leading to wound healing problems. - Patient seen with Dr. Trula Slade. He opened up wound and debrided wound. Wound packed with Kerlix and then wrapped in ACE - Will arrange [facility] RN to assist with BID packing changes to left leg - Rx for 14 days of Bactrim sent to patients pharmacy - He will follow up in 1 week for wound check.     Allergies  Allergen Reactions   Reglan [Metoclopramide] Other (See Comments)    Hyperactivity with higher doses (can tolerate lower doses)    Sglt2 Inhibitors Other (See Comments)    Cellulitis with Jardiance and Farxiga    Current Outpatient Medications  Medication Sig Dispense Refill   aspirin 81 MG chewable tablet Chew 1 tablet (81 mg total) by mouth daily.     clopidogrel (PLAVIX) 75 MG tablet TAKE 1 TABLET BY MOUTH EVERY DAY WITH BREAKFAST 90 tablet 0   Continuous Blood Gluc Receiver (DEXCOM G6 RECEIVER) DEVI See admin instructions.     fenofibrate 160 MG tablet Take 160 mg by mouth in the morning.     Glucose Blood (BAYER CONTOUR NEXT TEST VI) glucose testing     insulin regular human CONCENTRATED (HUMULIN R) 500 UNIT/ML injection Inject into the skin continuous. Using insulin pump     isosorbide mononitrate (IMDUR) 30 MG 24 hr tablet TAKE 1 TABLET BY MOUTH EVERY DAY 90 tablet 3   metFORMIN (GLUCOPHAGE) 1000 MG tablet Take 1,000 mg by mouth 2 (two) times daily with a meal.     metoprolol succinate (TOPROL-XL) 25 MG 24 hr tablet TAKE 3  TABLETS BY MOUTH TWICE A DAY WITH OR IMMEDIATELY FOLLOWING A MEAL 540 tablet 3   nitroGLYCERIN (NITROSTAT) 0.4 MG SL tablet Place 1 tablet (0.4 mg total) under the tongue every 5 (five) minutes as needed. 25 tablet 2   oxyCODONE-acetaminophen (PERCOCET/ROXICET) 5-325 MG tablet Take 1 tablet by mouth every 6 (six) hours as needed for moderate pain or severe pain. 10 tablet 0   Propylene Glycol (SYSTANE BALANCE) 0.6 % SOLN Place 1 drop into both eyes 4 (four) times daily as needed (dry eyes).     sacubitril-valsartan (ENTRESTO) 49-51 MG Take 1 tablet by mouth 2 (two) times daily. 60 tablet 6   spironolactone (ALDACTONE) 25 MG tablet TAKE 1 TABLET (25 MG TOTAL) BY MOUTH DAILY. 90 tablet 3   sulfamethoxazole-trimethoprim (BACTRIM DS) 800-160 MG tablet Take 1 tablet by mouth 2 (two) times daily. 28 tablet 0   torsemide (DEMADEX) 20 MG tablet Take 2 tablets (40 mg total) by mouth 2 (two) times daily. 120 tablet 5   TRESIBA FLEXTOUCH 200 UNIT/ML FlexTouch Pen Inject 36 Units into the skin in the morning.     VASCEPA 1 g capsule TAKE 2 CAPSULES BY MOUTH TWICE A DAY 360 capsule 4   No current facility-administered medications for this visit.     ROS:  See HPI  Physical Exam:  Incision:  appears to be healing well with beefy red base Extremities:  left LE well perfused with intact doppler DP/PT     Assessment/Plan:  This is a 56 y.o. male who is s/p:left below-knee popliteal with dehiscence BK incision.  He states his leg over all feels better and he is able to walk more without claudication.  He states he is elevating his legs multiple times a day and they are performing BID wet to dry dressing changes.  I told him he could shower weekly and then change the packing after his shower.   Continue current treatment plan and f/u in 3 weeks for wound check if he develops fevers, chills or purulent drainage he will call sooner.      Roxy Horseman PA-C [facility] [phone]   Clinic MD:  Stanford Breed

## 2021-09-01 ENCOUNTER — Ambulatory Visit (INDEPENDENT_AMBULATORY_CARE_PROVIDER_SITE_OTHER): Payer: Commercial Managed Care - PPO | Admitting: Physician Assistant

## 2021-09-01 VITALS — BP 101/64 | HR 96 | Temp 98.0°F | Resp 20 | Ht 70.0 in | Wt 219.4 lb

## 2021-09-01 DIAGNOSIS — I70212 Atherosclerosis of native arteries of extremities with intermittent claudication, left leg: Secondary | ICD-10-CM

## 2021-09-12 ENCOUNTER — Other Ambulatory Visit: Payer: Self-pay | Admitting: Cardiovascular Disease

## 2021-09-14 ENCOUNTER — Encounter (HOSPITAL_COMMUNITY): Payer: Self-pay | Admitting: Internal Medicine

## 2021-09-14 ENCOUNTER — Ambulatory Visit (HOSPITAL_COMMUNITY)
Admission: RE | Admit: 2021-09-14 | Discharge: 2021-09-14 | Disposition: A | Discharge status: Home / Self Care | Payer: Commercial Managed Care - PPO | Source: Ambulatory Visit | Attending: Internal Medicine | Admitting: Internal Medicine

## 2021-09-14 VITALS — BP 118/70 | HR 83 | Wt 223.8 lb

## 2021-09-14 DIAGNOSIS — I2510 Atherosclerotic heart disease of native coronary artery without angina pectoris: Secondary | ICD-10-CM | POA: Diagnosis not present

## 2021-09-14 DIAGNOSIS — E783 Hyperchylomicronemia: Secondary | ICD-10-CM | POA: Diagnosis not present

## 2021-09-14 DIAGNOSIS — Z79899 Other long term (current) drug therapy: Secondary | ICD-10-CM | POA: Insufficient documentation

## 2021-09-14 DIAGNOSIS — N1832 Chronic kidney disease, stage 3b: Secondary | ICD-10-CM | POA: Diagnosis not present

## 2021-09-14 DIAGNOSIS — Z7984 Long term (current) use of oral hypoglycemic drugs: Secondary | ICD-10-CM | POA: Insufficient documentation

## 2021-09-14 DIAGNOSIS — E1322 Other specified diabetes mellitus with diabetic chronic kidney disease: Secondary | ICD-10-CM | POA: Insufficient documentation

## 2021-09-14 DIAGNOSIS — Z794 Long term (current) use of insulin: Secondary | ICD-10-CM | POA: Insufficient documentation

## 2021-09-14 DIAGNOSIS — Z7982 Long term (current) use of aspirin: Secondary | ICD-10-CM | POA: Insufficient documentation

## 2021-09-14 DIAGNOSIS — I130 Hypertensive heart and chronic kidney disease with heart failure and stage 1 through stage 4 chronic kidney disease, or unspecified chronic kidney disease: Secondary | ICD-10-CM | POA: Insufficient documentation

## 2021-09-14 DIAGNOSIS — I340 Nonrheumatic mitral (valve) insufficiency: Secondary | ICD-10-CM | POA: Insufficient documentation

## 2021-09-14 DIAGNOSIS — E781 Pure hyperglyceridemia: Secondary | ICD-10-CM | POA: Insufficient documentation

## 2021-09-14 DIAGNOSIS — Z9641 Presence of insulin pump (external) (internal): Secondary | ICD-10-CM | POA: Insufficient documentation

## 2021-09-14 DIAGNOSIS — I5022 Chronic systolic (congestive) heart failure: Secondary | ICD-10-CM | POA: Diagnosis present

## 2021-09-14 DIAGNOSIS — Z951 Presence of aortocoronary bypass graft: Secondary | ICD-10-CM | POA: Diagnosis not present

## 2021-09-14 DIAGNOSIS — Z7902 Long term (current) use of antithrombotics/antiplatelets: Secondary | ICD-10-CM | POA: Diagnosis not present

## 2021-09-14 DIAGNOSIS — Z955 Presence of coronary angioplasty implant and graft: Secondary | ICD-10-CM | POA: Diagnosis not present

## 2021-09-14 DIAGNOSIS — E119 Type 2 diabetes mellitus without complications: Secondary | ICD-10-CM | POA: Diagnosis not present

## 2021-09-14 LAB — BASIC METABOLIC PANEL
Anion gap: 11 (ref 5–15)
BUN: 32 mg/dL — ABNORMAL HIGH (ref 6–20)
CO2: 20 mmol/L — ABNORMAL LOW (ref 22–32)
Calcium: 10.2 mg/dL (ref 8.9–10.3)
Chloride: 103 mmol/L (ref 98–111)
Creatinine, Ser: 1.47 mg/dL — ABNORMAL HIGH (ref 0.61–1.24)
GFR, Estimated: 56 mL/min — ABNORMAL LOW (ref >60)
Glucose, Bld: 133 mg/dL — ABNORMAL HIGH (ref 70–99)
Potassium: 4.6 mmol/L (ref 3.5–5.1)
Sodium: 134 mmol/L — ABNORMAL LOW (ref 135–145)

## 2021-09-14 NOTE — Patient Instructions (Signed)
There has been no changes to your medications.  Labs done today, your results will be available in MyChart, we will contact you for abnormal readings.  Your physician recommends that you schedule a follow-up appointment in: 6 months ( December 2023)  **please call the office in September to arrange your follow up appointment. **  If you have any questions or concerns before your next appointment please send [place] a message through [place] or call our office at [phone].    TO LEAVE A MESSAGE FOR THE NURSE SELECT OPTION 2, PLEASE LEAVE A MESSAGE INCLUDING: YOUR NAME DATE OF BIRTH CALL BACK NUMBER REASON FOR CALL**this is important as we prioritize the call backs  YOU WILL RECEIVE A CALL BACK THE SAME DAY AS LONG AS YOU CALL BEFORE 4:00 PM  At the [facility], you and your health needs are our priority. As part of our continuing mission to provide you with exceptional heart care, we have created designated Provider Care Teams. These Care Teams include your primary Cardiologist (physician) and Advanced Practice Providers (APPs- Physician Assistants and Nurse Practitioners) who all work together to provide you with the care you need, when you need it.   You may see any of the following providers on your designated Care Team at your next follow up: Dr Glori Bickers Dr Haynes Kerns, NP Lyda Jester, [place] [facility] [place], [place] Audry Riles, PharmD   Please be sure to bring in all your medications bottles to every appointment.

## 2021-09-14 NOTE — Addendum Note (Signed)
Encounter addended by: Jerl Mina, RN on: 09/14/2021 10:39 AM  Actions taken: Order list changed, Diagnosis association updated, Charge Capture section accepted, Clinical Note Signed

## 2021-09-14 NOTE — Progress Notes (Signed)
ADVANCED HF CLINIC NOTE  PCP: Kathalene Frames, MD Primary Cardiologist: Gwenlyn Found HF: Dr. Haroldine Laws  HPI: Juan [name] is 56 y.o. male w/ h/o systolic HF, EF 61%, 3VCAD s/p CABG in 2014 followed by stenting to SVG-RCA in 2018, DM2, severe hypertriglyceridemia w/ subsequent episodes of pancreatitis.  Seen by Dr. Gwenlyn Found 11/21 for progressive dyspnea and wt gain which was being managed by increasing home diuretics w/o much improvement in volume or symptoms. Echo repeated showing stable LVEF at 45%. RV systolic function low normal. Mild-mod MR also noted. Given persistent symptoms, he had LHC that showed progression of his native and graft disease.  All vein grafts are functionally occluded with minimal left to right collaterals.  His LIMA is patent although his LAD is diffusely diseased.  RHC showed mPCWP mildly elevated at 19. LVEDP 23. CO by Fick 3.25, CI 1.48, by thermo calculation CO 7, CI 3.2. [name] was markedly edematous and admitted for management of acute on chronic systolic heart failure with IV diuretics. In addition, he had TTE which showed only mild-mod MR.  Diuresed with IV lasix and transitioned to [facility].  Echo 4/22 EF 50-55% mild MR   Follow up 2/23 he had new exertional chest tightness and volume was up. Torsemide increased to 40 bid and echo arranged.   Echo 05/22/21 showed EF 50%, mild LVH, RV mildly reduced, aortic root dilation 37 mm  Today he returns for HF follow up. Struggling with infection at incision site at LLE bypass graft. Remains on torsemide 40 bid. Edema stable. Having some mild chest discomfort. No orthopnea or PND. Remains out of work. No recent bloodwork.   ROS: All systems negative except as listed in HPI, PMH and Problem List.  SH:  Social History   Socioeconomic History   Marital status: Married    Spouse name: Water quality scientist   Number of children: 1   Years of education: 16   Highest education level: Not on file  Occupational History   Occupation:  Engineer, drilling     Employer: [employer]  Tobacco Use   Smoking status: Never    Passive exposure: Never   Smokeless tobacco: Never  Vaping Use   Vaping Use: Never used  Substance and Sexual Activity   Alcohol use: No   Drug use: No   Sexual activity: Yes  Other Topics Concern   Not on file  Social History Narrative   Adopted, so no pertinent FH.  Married.  Lives with wife in [place].  Independent of ADLs and ambulation.   Social Determinants of Health   Financial Resource Strain: Not on file  Food Insecurity: Not on file  Transportation Needs: Not on file  Physical Activity: Not on file  Stress: Not on file  Social Connections: Not on file  Intimate Partner Violence: Not on file   FH:  Family History  Adopted: Yes   Past Medical History:  Diagnosis Date   Abnormal cardiovascular stress test 02/06/2013   Anemia    occasional - no problems currently(10/02/2011)   Bilateral renal cysts 06/16/2011   states no known problems   Blood transfusion without reported diagnosis    CAD (coronary artery disease), LAD 90%, 1st diag 95%, LCX 70% wth AV groove 90%, RCA 40-50% mid and 80% long distal stenosis 02/03/13 02/04/2013   Chest pain    positive Myoview stress test   CHF (congestive heart failure) (HCC)    Coronary artery calcification seen on CAT scan  Diabetes mellitus    IDDM   Fatty liver disease, nonalcoholic 12/87/8676   Hyperlipidemia    Hypertension    states no dx. of HTN, takes med. to protect kidneys due to DM   Lateral meniscus tear 09/2011   left   Loose body in knee 09/2011   loose bodies left knee   Non Hodgkin's lymphoma ([place]) 1991   Pancreatitis    occasional - last episode 06/2011   Pneumonia    S/P CABG x 4, 02/05/13 LIMA-LAD; LT. RADIAL-OM;VG-DIAG; VG-PDA 02/06/2013   10/18 3/4 patent grafts (occluded SVG-->Diag), PCI/DESx 3 SVG-->RCA, normal EF   Sleep apnea    mild per patient   Splenomegaly, congestive, chronic     Stuffy and runny nose 10/02/2011   yellow drainage from nose   Current Outpatient Medications  Medication Sig Dispense Refill   aspirin 81 MG chewable tablet Chew 1 tablet (81 mg total) by mouth daily.     clopidogrel (PLAVIX) 75 MG tablet TAKE 1 TABLET BY MOUTH EVERY DAY WITH BREAKFAST 30 tablet 1   Continuous Blood Gluc Receiver (DEXCOM G6 RECEIVER) DEVI See admin instructions.     fenofibrate 160 MG tablet Take 160 mg by mouth in the morning.     Glucose Blood (BAYER CONTOUR NEXT TEST VI) glucose testing     insulin regular human CONCENTRATED (HUMULIN R) 500 UNIT/ML injection Inject into the skin continuous. Using insulin pump     isosorbide mononitrate (IMDUR) 30 MG 24 hr tablet TAKE 1 TABLET BY MOUTH EVERY DAY 90 tablet 3   metFORMIN (GLUCOPHAGE) 1000 MG tablet Take 1,000 mg by mouth 2 (two) times daily with a meal.     metoprolol succinate (TOPROL-XL) 25 MG 24 hr tablet TAKE 3 TABLETS BY MOUTH TWICE A DAY WITH OR IMMEDIATELY FOLLOWING A MEAL 540 tablet 3   nitroGLYCERIN (NITROSTAT) 0.4 MG SL tablet Place 1 tablet (0.4 mg total) under the tongue every 5 (five) minutes as needed. 25 tablet 2   Propylene Glycol (SYSTANE BALANCE) 0.6 % SOLN Place 1 drop into both eyes 4 (four) times daily as needed (dry eyes).     sacubitril-valsartan (ENTRESTO) 49-51 MG Take 1 tablet by mouth 2 (two) times daily. 60 tablet 6   spironolactone (ALDACTONE) 25 MG tablet TAKE 1 TABLET (25 MG TOTAL) BY MOUTH DAILY. 90 tablet 3   torsemide (DEMADEX) 20 MG tablet Take 2 tablets (40 mg total) by mouth 2 (two) times daily. 120 tablet 5   TRESIBA FLEXTOUCH 200 UNIT/ML FlexTouch Pen Inject 36 Units into the skin in the morning.     VASCEPA 1 g capsule TAKE 2 CAPSULES BY MOUTH TWICE A DAY 360 capsule 4   No current facility-administered medications for this encounter.   BP 118/70   Pulse 83   Wt 101.5 kg (223 lb 12.8 oz)   SpO2 97%   BMI 32.11 kg/m   Wt Readings from Last 3 Encounters:  09/14/21 101.5 kg (223  lb 12.8 oz)  09/01/21 99.5 kg (219 lb 6.4 oz)  08/22/21 104.3 kg (230 lb)   PHYSICAL EXAM: General:  Well appearing. No resp difficulty HEENT: normal Neck: supple. no JVD. Carotids 2+ bilat; no bruits. No lymphadenopathy or thryomegaly appreciated. Cor: PMI nondisplaced. Regular rate & rhythm. No rubs, gallops or murmurs. Lungs: clear Abdomen: soft, nontender, nondistended. No hepatosplenomegaly. No bruits or masses. Good bowel sounds. Extremities: no cyanosis, clubbing, rash, edema  LLE in wrap Neuro: alert & orientedx3, cranial nerves grossly intact. moves  all 4 extremities w/o difficulty. Affect pleasant  ASSESSMENT & PLAN:  1. Chronic Systolic Heart Failure due to iCM - Echo 2017 showed normal LVEF 60-65% - Echo 4/21, EF mildly reduced 45%. NST showed scar but no ischemia - Echo 11/21, EF 97%, RV systolic function low normal  - RHC 11/21 showed mildly elevated filling pressures, PCWP 19. LVEDP 23. CO by Fick 3.25, CI 1.48, by thermo calculation CO 7, CI 3.2.  - Echo 4/22 50-55%  - Echo 3/23, 05/22/21 EF 50%  - NYHA II.  - Continue torsemide 40 mg bid. - Continue spiro 25 mg daily. - Continue Entresto 49/51 mg bid. - Continue Toprol 75 mg bid. - Continue compression stockings. - Ideally would like to add SGLT2i but he is functionally DM1.5 so I have asked him to discuss with Dr. Debbora Presto if this is ok. She said it was ok to try but stopped because he got cellulitis. - I think Cardiomems will help better manage his fluid and avoid ups/downs in his renal function. He agrees. Insurance denied him 2x.     2. CAD - s/p CABG in 2014, followed by PCI to [place] in 2018 - [place] 12/21 showed progression of native vessel CAD and grafts. All vein grafts are functionally occluded with minimal L->R collaterals.  His LIMA is patent although his LAD is diffusely diseased (diabetic). No targets for intervention and not a candidate for redo CABG.  - Occasional CP - seems mostly related to when he has  extra fluid  - Followed by Dr. Gwenlyn Found. - Cont ASA, Plavix, LA nitrate + ? blocker. Refusing statin.    3. Mitral Regurgitation  - Very mild MR noted on TEE 11/21 after diuresis.  - Trivial on echo 3/23  4. IDDM - Has insulin pump.  - Managed by Dr. Debbora Presto. - Last A1c ~ 9  - See SGLT2i discussion above.   5. Severe Hypertriglyceridemia  - Has been as high as 6,000 - Now follows with Dr. Debara Pickett with diagnosis of severe hyperchylomicronemia. His testing for FCS was genetically negative  6. PAD - struggling with infected LLE bypass graft. Followed by [facility]. Improving  7. CKD IIIb - Scr ranges 1.6-2.0 - refusing Nephrology referral currently   Glori Bickers, MD  10:21 AM

## 2021-09-18 ENCOUNTER — Ambulatory Visit (INDEPENDENT_AMBULATORY_CARE_PROVIDER_SITE_OTHER): Payer: Commercial Managed Care - PPO | Admitting: Physician Assistant

## 2021-09-18 VITALS — BP 116/68 | HR 88 | Temp 98.1°F | Resp 20 | Ht 70.0 in | Wt 223.0 lb

## 2021-09-18 DIAGNOSIS — I70212 Atherosclerosis of native arteries of extremities with intermittent claudication, left leg: Secondary | ICD-10-CM

## 2021-09-18 NOTE — Progress Notes (Signed)
POST OPERATIVE OFFICE NOTE    CC:  F/u for surgery  HPI:  This is a 56 y.o. male who is s/p left SFA to TP trunk bypass with vein due to popliteal occlusion and severe lifestyle limiting claudication.  This was performed by Dr. Trula Slade on 07/28/2021.  Postoperatively he developed dehiscence of the below the knee incision.  His wife has been packing the wound with a wet 4 x 4.  The patient and his wife believe the wound is improving.  He denies any claudication since surgery.  He continues to take his aspirin and Plavix daily.  Allergies  Allergen Reactions   Reglan [Metoclopramide] Other (See Comments)    Hyperactivity with higher doses (can tolerate lower doses)    Sglt2 Inhibitors Other (See Comments)    Cellulitis with Jardiance and Farxiga    Current Outpatient Medications  Medication Sig Dispense Refill   aspirin 81 MG chewable tablet Chew 1 tablet (81 mg total) by mouth daily.     clopidogrel (PLAVIX) 75 MG tablet TAKE 1 TABLET BY MOUTH EVERY DAY WITH BREAKFAST 30 tablet 1   Continuous Blood Gluc Receiver (DEXCOM G6 RECEIVER) DEVI See admin instructions.     fenofibrate 160 MG tablet Take 160 mg by mouth in the morning.     Glucose Blood (BAYER CONTOUR NEXT TEST VI) glucose testing     insulin regular human CONCENTRATED (HUMULIN R) 500 UNIT/ML injection Inject into the skin continuous. Using insulin pump     isosorbide mononitrate (IMDUR) 30 MG 24 hr tablet TAKE 1 TABLET BY MOUTH EVERY DAY 90 tablet 3   metFORMIN (GLUCOPHAGE) 1000 MG tablet Take 1,000 mg by mouth 2 (two) times daily with a meal.     metoprolol succinate (TOPROL-XL) 25 MG 24 hr tablet TAKE 3 TABLETS BY MOUTH TWICE A DAY WITH OR IMMEDIATELY FOLLOWING A MEAL 540 tablet 3   nitroGLYCERIN (NITROSTAT) 0.4 MG SL tablet Place 1 tablet (0.4 mg total) under the tongue every 5 (five) minutes as needed. 25 tablet 2   Propylene Glycol (SYSTANE BALANCE) 0.6 % SOLN Place 1 drop into both eyes 4 (four) times daily as needed (dry  eyes).     sacubitril-valsartan (ENTRESTO) 49-51 MG Take 1 tablet by mouth 2 (two) times daily. 60 tablet 6   spironolactone (ALDACTONE) 25 MG tablet TAKE 1 TABLET (25 MG TOTAL) BY MOUTH DAILY. 90 tablet 3   torsemide (DEMADEX) 20 MG tablet Take 2 tablets (40 mg total) by mouth 2 (two) times daily. 120 tablet 5   TRESIBA FLEXTOUCH 200 UNIT/ML FlexTouch Pen Inject 36 Units into the skin in the morning.     VASCEPA 1 g capsule TAKE 2 CAPSULES BY MOUTH TWICE A DAY 360 capsule 4   No current facility-administered medications for this visit.     ROS:  See HPI  Physical Exam:  Vitals:   09/18/21 0933  BP: 116/68  Pulse: 88  Resp: 20  Temp: 98.1 F (36.7 C)  SpO2: 95%  Weight: 223 lb (101.2 kg)  Height: '5\' 10"'$  (1.778 m)    Incision: Pictured below; unable to return any drainage or purulence with probing of proximal portion of incision Extremities:   Palpable PT pulse Neuro: Alert and oriented     Assessment/Plan:  This is a 56 y.o. male who is s/p: Left SFA to TP trunk bypass with vein due to popliteal artery occlusion  -Left foot is well-perfused with 2+ palpable left PT pulse -Wound is improving based on  comparison to last office visit picture.  Proximal incision was probed with no return of purulence or bleeding.  Wound was redressed with wet-to-dry packing.  He will continue dressing changes at least daily.  I discussed using soap and water then patting wound dry.  He will return for wound check in 2 weeks.  He will call/return office sooner with any questions or concerns.   Dagoberto Ligas, PA-C [facility] [phone]  Clinic MD:  Trula Slade

## 2021-10-02 ENCOUNTER — Ambulatory Visit (INDEPENDENT_AMBULATORY_CARE_PROVIDER_SITE_OTHER): Payer: Commercial Managed Care - PPO | Admitting: Physician Assistant

## 2021-10-02 ENCOUNTER — Encounter: Payer: Self-pay | Admitting: Surgery

## 2021-10-02 VITALS — BP 101/63 | HR 84 | Temp 98.6°F | Resp 20 | Ht 70.0 in | Wt 272.2 lb

## 2021-10-02 DIAGNOSIS — I70212 Atherosclerosis of native arteries of extremities with intermittent claudication, left leg: Secondary | ICD-10-CM

## 2021-10-02 NOTE — Progress Notes (Signed)
POST OPERATIVE OFFICE NOTE    CC:  F/u for surgery  HPI:  This is a 56 y.o. male who is s/p Left distal superficial femoral artery to tibioperoneal trunk bypass graft with ipsilateral nonreversed saphenous vein on 07/28/21 by Dr. Trula Slade.  LLE is well perfused. Left below knee incision with some infection and dehiscence of incision. Patient has history of Non Hodgkin's lymphoma with radiation to his left knee in 1990. Suspect that secondary to this as well as CHF and PAD that his tissue is not very healthy in this area leading to wound healing problems.  He continues to do wet to dry packing daily, elevation when at rest alternating with mobility/activity.  He is pleased with his out come.    H is medically managed on ASA and  Plavix     Allergies  Allergen Reactions   Empagliflozin     Other reaction(s): other   Reglan [Metoclopramide] Other (See Comments)    Hyperactivity with higher doses (can tolerate lower doses)    Sglt2 Inhibitors Other (See Comments)    Cellulitis with Jardiance and Farxiga    Current Outpatient Medications  Medication Sig Dispense Refill   aspirin 81 MG chewable tablet Chew 1 tablet (81 mg total) by mouth daily.     clopidogrel (PLAVIX) 75 MG tablet TAKE 1 TABLET BY MOUTH EVERY DAY WITH BREAKFAST 30 tablet 1   Continuous Blood Gluc Receiver (DEXCOM G6 RECEIVER) DEVI See admin instructions.     fenofibrate 160 MG tablet Take 160 mg by mouth in the morning.     Glucose Blood (BAYER CONTOUR NEXT TEST VI) glucose testing     insulin regular human CONCENTRATED (HUMULIN R) 500 UNIT/ML injection Inject into the skin continuous. Using insulin pump     isosorbide mononitrate (IMDUR) 30 MG 24 hr tablet TAKE 1 TABLET BY MOUTH EVERY DAY 90 tablet 3   metFORMIN (GLUCOPHAGE) 1000 MG tablet Take 1,000 mg by mouth 2 (two) times daily with a meal.     metoprolol succinate (TOPROL-XL) 25 MG 24 hr tablet TAKE 3 TABLETS BY MOUTH TWICE A DAY WITH OR IMMEDIATELY FOLLOWING A MEAL  540 tablet 3   nitroGLYCERIN (NITROSTAT) 0.4 MG SL tablet Place 1 tablet (0.4 mg total) under the tongue every 5 (five) minutes as needed. 25 tablet 2   Propylene Glycol (SYSTANE BALANCE) 0.6 % SOLN Place 1 drop into both eyes 4 (four) times daily as needed (dry eyes).     sacubitril-valsartan (ENTRESTO) 49-51 MG Take 1 tablet by mouth 2 (two) times daily. 60 tablet 6   spironolactone (ALDACTONE) 25 MG tablet TAKE 1 TABLET (25 MG TOTAL) BY MOUTH DAILY. 90 tablet 3   torsemide (DEMADEX) 20 MG tablet Take 2 tablets (40 mg total) by mouth 2 (two) times daily. 120 tablet 5   TRESIBA FLEXTOUCH 200 UNIT/ML FlexTouch Pen Inject 36 Units into the skin in the morning.     VASCEPA 1 g capsule TAKE 2 CAPSULES BY MOUTH TWICE A DAY 360 capsule 4   No current facility-administered medications for this visit.     ROS:  See HPI  Physical Exam:     Incision:  healing well. Extremities:  doppler signals intact DP/PT well perfused Neuro: sensation grossly intact     Assessment/Plan:  This is a 56 y.o. male who is s/p:Left distal superficial femoral artery to tibioperoneal trunk bypass graft with ipsilateral nonreversed saphenous vein on 07/28/21 by Dr. Trula Slade.  LLE is well perfused. Left below knee  incision with some infection and dehiscence of incision.  The below knee incision is almost healed.  He will cont. Wet to dry then dry dressing as needed until the skin is completely healed.  He is increasing his activity and denies claudication, new non healing wounds or rest pain.   I will have him f/u in 6 months for duplex and ABI baseline.  He is planning on returning to work in soon at the beginning of next month.  He will call if he has concerns.    Roxy Horseman [facility] [phone]   Clinic MD:  Trula Slade

## 2021-10-10 ENCOUNTER — Other Ambulatory Visit: Payer: Self-pay

## 2021-10-10 DIAGNOSIS — I739 Peripheral vascular disease, unspecified: Secondary | ICD-10-CM

## 2021-10-10 DIAGNOSIS — I70212 Atherosclerosis of native arteries of extremities with intermittent claudication, left leg: Secondary | ICD-10-CM

## 2021-10-24 ENCOUNTER — Ambulatory Visit: Payer: Self-pay | Admitting: Surgery

## 2021-10-25 ENCOUNTER — Telehealth: Payer: Self-pay | Admitting: *Deleted

## 2021-10-25 NOTE — Telephone Encounter (Signed)
   Pre-operative Risk Assessment    Patient Name: Juan [name]  DOB: 12-Mar-1966 MRN: [id number]      Request for Surgical Clearance    Procedure:   PARATHYROID SURGERY  Date of Surgery:  Clearance TBD                               Surgeon:  DR. Armandina Gemma Surgeon's Group or Practice Name:  [facility] Phone number:  [phone] Fax number:  [phone] ATTN: Mammie Lorenzo, LPN   Type of Clearance Requested:   - Medical  - Pharmacy:  Hold Aspirin and Clopidogrel (Plavix)     Type of Anesthesia:  General    Additional requests/questions:    Jiles Prows   10/25/2021, 1:51 PM

## 2021-10-25 NOTE — Telephone Encounter (Signed)
Routing to Dr. Haroldine Laws and AHF team for clearance. Patient last seen in their clinic on 09/14/21.

## 2021-10-26 ENCOUNTER — Telehealth: Payer: Self-pay | Admitting: *Deleted

## 2021-10-26 NOTE — Telephone Encounter (Signed)
   Name: Juan [name]  DOB: [birth year]  MRN: [id number]  Primary Cardiologist: Quay Burow, MD   Preoperative team, please contact this patient and set up a phone call appointment for further preoperative risk assessment. Please obtain consent and complete medication review. Thank you for your help.  I confirm that guidance regarding antiplatelet and oral anticoagulation therapy has been completed and, if necessary, noted below.   Charlie Pitter, PA-C 10/26/2021, 3:02 PM [place]

## 2021-10-26 NOTE — Telephone Encounter (Signed)
Tele appt pre op 11/02/21 @ 2 pm. Med rec and consent are done.     Patient Consent for Virtual Visit        Juan [name] has provided verbal consent on 10/26/2021 for a virtual visit (video or telephone).   CONSENT FOR VIRTUAL VISIT FOR:  Juan [name]  By participating in this virtual visit I agree to the following:  I hereby voluntarily request, consent and authorize [facility] and its employed or contracted physicians, physician assistants, nurse practitioners or other licensed health care professionals (the Practitioner), to provide me with telemedicine health care services (the "Services") as deemed necessary by the treating Practitioner. I acknowledge and consent to receive the Services by the Practitioner via telemedicine. I understand that the telemedicine visit will involve communicating with the Practitioner through live audiovisual communication technology and the disclosure of certain medical information by electronic transmission. I acknowledge that I have been given the opportunity to request an in-person assessment or other available alternative prior to the telemedicine visit and am voluntarily participating in the telemedicine visit.  I understand that I have the right to withhold or withdraw my consent to the use of telemedicine in the course of my care at any time, without affecting my right to future care or treatment, and that the Practitioner or I may terminate the telemedicine visit at any time. I understand that I have the right to inspect all information obtained and/or recorded in the course of the telemedicine visit and may receive copies of available information for a reasonable fee.  I understand that some of the potential risks of receiving the Services via telemedicine include:  Delay or interruption in medical evaluation due to technological equipment failure or disruption; Information transmitted may not be sufficient (e.g. poor resolution of images) to allow  for appropriate medical decision making by the Practitioner; and/or  In rare instances, security protocols could fail, causing a breach of personal health information.  Furthermore, I acknowledge that it is my responsibility to provide information about my medical history, conditions and care that is complete and accurate to the best of my ability. I acknowledge that Practitioner's advice, recommendations, and/or decision may be based on factors not within their control, such as incomplete or inaccurate data provided by me or distortions of diagnostic images or specimens that may result from electronic transmissions. I understand that the practice of medicine is not an exact science and that Practitioner makes no warranties or guarantees regarding treatment outcomes. I acknowledge that a copy of this consent can be made available to me via my patient portal ([place]), or I can request a printed copy by calling the office of [place].    I understand that my insurance will be billed for this visit.   I have read or had this consent read to me. I understand the contents of this consent, which adequately explains the benefits and risks of the Services being provided via telemedicine.  I have been provided ample opportunity to ask questions regarding this consent and the Services and have had my questions answered to my satisfaction. I give my informed consent for the services to be provided through the use of telemedicine in my medical care

## 2021-10-26 NOTE — Telephone Encounter (Signed)
   Name: Juan [name]  DOB: 09-Nov-1965  MRN: [id number]  Primary Cardiologist: Quay Burow, MD  H/o reviewed, IDDM, pancreatitis with HLD/hypertriglyceridemia, CABGx3 in 2014, stented SVG-RCA in 2018, chronic HFrEF, aortic root dilation. Had re-look cath 01/2020 for progressive dyspnea- per Dr. Kennon Holter note, "revealing occluded native vessels, occluded vein graft to the RCA and diagonal branch, patent left radial to distal OM but the OM was functionally occluded after insertion and a patent LIMA to the LAD.  His LVEDP was elevated as was his V wave.  I ended up admitting him to the heart failure service for medication optimization." Last gen cards visit 2022. Last HF visit 08/2021. I will route to Dr. Gwenlyn Found for input on holding ASA and Plavix as requested for parathyroid surgery. Dr. Gwenlyn Found - Please route response to P CV DIV PREOP (the pre-op pool). Thank you.  Since last visit was >1 month ago, anticipate virtual visit after Dr. Gwenlyn Found replies.  Charlie Pitter, PA-C 10/26/2021, 12:57 PM [place]

## 2021-10-26 NOTE — Telephone Encounter (Signed)
Tele appt pre op 11/02/21 @ 2 pm. Med rec and consent are done.

## 2021-10-30 ENCOUNTER — Other Ambulatory Visit: Payer: Self-pay | Admitting: Surgery

## 2021-10-30 DIAGNOSIS — E210 Primary hyperparathyroidism: Secondary | ICD-10-CM

## 2021-11-02 ENCOUNTER — Ambulatory Visit (INDEPENDENT_AMBULATORY_CARE_PROVIDER_SITE_OTHER): Payer: Commercial Managed Care - PPO | Admitting: Nurse Practitioner

## 2021-11-02 DIAGNOSIS — Z01810 Encounter for preprocedural cardiovascular examination: Secondary | ICD-10-CM | POA: Diagnosis not present

## 2021-11-02 NOTE — Progress Notes (Addendum)
Virtual Visit via Telephone Note   Because of Juan [name]'s co-morbid illnesses, he is at least at moderate risk for complications without adequate follow up.  This format is felt to be most appropriate for this patient at this time.  The patient did not have access to video technology/had technical difficulties with video requiring transitioning to audio format only (telephone).  All issues noted in this document were discussed and addressed.  No physical exam could be performed with this format.  Please refer to the patient's chart for his consent to telehealth for [facility].  Evaluation Performed:  Preoperative cardiovascular risk assessment _____________   Date:  11/02/2021   Patient ID:  NIKHOLAS GEFFRE, DOB 04-13-1965, MRN [id number] Patient Location:  Home Provider location:   Office  Primary Care Provider:  Kathalene Frames, MD Primary Cardiologist:  Quay Burow, MD  Chief Complaint / Patient Profile   56 y.o. y/o male with a h/o CAD s/p CABG in 2014,  DES-SVG-RCA in 6294, chronic systolic heart failure (EF 45%), hypertension, hyperlipidemia, type 2 diabetes, severe hypertriglyceridemia with episodes of pancreatitis, anemia, OSA who is pending parathyroid surgery, date TBD, with Dr. Beverly Gust of [facility] and presents today for telephonic preoperative cardiovascular risk assessment.  Past Medical History    Past Medical History:  Diagnosis Date   Abnormal cardiovascular stress test 02/06/2013   Anemia    occasional - no problems currently(10/02/2011)   Bilateral renal cysts 06/16/2011   states no known problems   Blood transfusion without reported diagnosis    CAD (coronary artery disease), LAD 90%, 1st diag 95%, LCX 70% wth AV groove 90%, RCA 40-50% mid and 80% long distal stenosis 02/03/13 02/04/2013   Chest pain    positive Myoview stress test   CHF (congestive heart failure) (HCC)    Coronary artery calcification seen on CAT  scan    Diabetes mellitus    IDDM   Fatty liver disease, nonalcoholic 76/54/6503   Hyperlipidemia    Hypertension    states no dx. of HTN, takes med. to protect kidneys due to DM   Lateral meniscus tear 09/2011   left   Loose body in knee 09/2011   loose bodies left knee   Non Hodgkin's lymphoma ([place]) 1991   Pancreatitis    occasional - last episode 06/2011   Pneumonia    S/P CABG x 4, 02/05/13 LIMA-LAD; LT. RADIAL-OM;VG-DIAG; VG-PDA 02/06/2013   10/18 3/4 patent grafts (occluded SVG-->Diag), PCI/DESx 3 SVG-->RCA, normal EF   Sleep apnea    mild per patient   Splenomegaly, congestive, chronic    Stuffy and runny nose 10/02/2011   yellow drainage from nose   Past Surgical History:  Procedure Laterality Date   ABDOMINAL AORTIC ANEURYSM REPAIR     ABDOMINAL AORTOGRAM W/LOWER EXTREMITY N/A 07/18/2021   Procedure: ABDOMINAL AORTOGRAM W/LOWER EXTREMITY;  Surgeon: Serafina Mitchell, MD;  Location: [place] CV [facility];  Service: Cardiovascular;  Laterality: N/A;   ANTERIOR CERVICAL DECOMP/DISCECTOMY FUSION N/A 03/26/2018   Procedure: ANTERIOR CERVICAL DECOMPRESSION FUSION CERVICAL 5-6 WITH INSTRUMENTATION AND ALLOGRAFT;  Surgeon: Phylliss Bob, MD;  Location: [place];  Service: Orthopedics;  Laterality: N/A;   CARDIAC CATHETERIZATION     CORONARY ARTERY BYPASS GRAFT N/A 02/05/2013   Procedure: CORONARY ARTERY BYPASS GRAFTING (CABG);  Surgeon: Ivin Poot, MD;  Location: [place];  Service: Open Heart Surgery;  Laterality: N/A;  Coronary artery bypass graft on pump times four using left internal  mammary artery and right greater saphenous vein via endovein harvest and left radial artery harvest.    CORONARY STENT INTERVENTION  12/17/2016    PCI and drug-eluting stenting of the mid and distal RCA SVG    CORONARY STENT INTERVENTION N/A 12/17/2016   Procedure: CORONARY STENT INTERVENTION;  Surgeon: Lorretta Harp, MD;  Location: [place] CV [facility];  Service: Cardiovascular;  Laterality: N/A;    ELBOW SURGERY     FEMORAL-POPLITEAL BYPASS GRAFT Left 07/28/2021   Procedure: LEFT FEMORAL-BELOW KNEE POPLITEAL ARTERY BYPASS GRAFT;  Surgeon: Serafina Mitchell, MD;  Location: [place];  Service: Vascular;  Laterality: Left;   HERNIA REPAIR     inguinal    herniated disc     INTRAOPERATIVE TRANSESOPHAGEAL ECHOCARDIOGRAM N/A 02/05/2013   Procedure: INTRAOPERATIVE TRANSESOPHAGEAL ECHOCARDIOGRAM;  Surgeon: Ivin Poot, MD;  Location: [place];  Service: Open Heart Surgery;  Laterality: N/A;   KNEE ARTHROSCOPY  10/09/2011   Procedure: ARTHROSCOPY KNEE;  Surgeon: Nita Sells, MD;  Location: [place];  Service: Orthopedics;  Laterality: Left;   LEFT HEART CATH AND CORS/GRAFTS ANGIOGRAPHY N/A 12/17/2016   Procedure: LEFT HEART CATH AND CORS/GRAFTS ANGIOGRAPHY;  Surgeon: Lorretta Harp, MD;  Location: [place] CV LAB;  Service: Cardiovascular;  Laterality: N/A;   LEFT HEART CATHETERIZATION WITH CORONARY ANGIOGRAM N/A 02/03/2013   Procedure: LEFT HEART CATHETERIZATION WITH CORONARY ANGIOGRAM;  Surgeon: Lorretta Harp, MD;  Location: [facility] CATH LAB;  Service: Cardiovascular;  Laterality: N/A;   NASAL SEPTUM SURGERY     RADIAL ARTERY HARVEST Left 02/05/2013   Procedure: RADIAL ARTERY HARVEST;  Surgeon: Ivin Poot, MD;  Location: [place];  Service: Vascular;  Laterality: Left;   RIGHT/LEFT HEART CATH AND CORONARY/GRAFT ANGIOGRAPHY N/A 02/15/2020   Procedure: RIGHT/LEFT HEART CATH AND CORONARY/GRAFT ANGIOGRAPHY;  Surgeon: Lorretta Harp, MD;  Location: [place] CV LAB;  Service: Cardiovascular;  Laterality: N/A;   TEE WITHOUT CARDIOVERSION N/A 02/18/2020   Procedure: TRANSESOPHAGEAL ECHOCARDIOGRAM (TEE);  Surgeon: Jolaine Artist, MD;  Location: [facility] ENDOSCOPY;  Service: Cardiovascular;  Laterality: N/A;   TIBIA BONE BIOPSY  x 3   left   TONSILLECTOMY     VEIN HARVEST Left 07/28/2021   Procedure: HARVEST OF GREAT SAPHENOUS VEIN;  Surgeon: Serafina Mitchell, MD;   Location: [place];  Service: Vascular;  Laterality: Left;    Allergies  Allergies  Allergen Reactions   Empagliflozin     Other reaction(s): other   Reglan [Metoclopramide] Other (See Comments)    Hyperactivity with higher doses (can tolerate lower doses)    Sglt2 Inhibitors Other (See Comments)    Cellulitis with Vania Rea and Farxiga    History of Present Illness    JOAOPEDRO ESCHBACH is a 56 y.o. male who presents via audio/video conferencing for a telehealth visit today.  Pt was last seen in cardiology clinic on 09/14/2021 by Dr. Haroldine Laws. At that time DIVONTE SENGER was doing well.  The patient is now pending procedure as outlined above. Since his last visit, he has been stable from a cardiac standpoint. He denies chest pain, palpitations, dyspnea, pnd, orthopnea, n, v, dizziness, syncope, edema, weight gain, or early satiety. All other systems reviewed and are otherwise negative except as noted above.   Home Medications    Prior to Admission medications   Medication Sig Start Date End Date Taking? Authorizing Provider  aspirin 81 MG chewable tablet Chew 1 tablet (81 mg total) by mouth daily. 12/19/16  Cheryln Manly, NP  clopidogrel (PLAVIX) 75 MG tablet TAKE 1 TABLET BY MOUTH EVERY DAY WITH BREAKFAST 09/12/21   Lorretta Harp, MD  Continuous Blood Gluc Receiver ([place]) [place] See admin instructions. 12/09/19   [provider]  fenofibrate 160 MG tablet Take 160 mg by mouth in the morning.    [provider]  Glucose Blood (BAYER CONTOUR NEXT TEST VI) glucose testing    [provider]  insulin regular human CONCENTRATED (HUMULIN R) 500 UNIT/ML injection Inject into the skin continuous. Using insulin pump    [provider]  isosorbide mononitrate (IMDUR) 30 MG 24 hr tablet TAKE 1 TABLET BY MOUTH EVERY DAY 12/02/20   Hilty, Nadean Corwin, MD  metFORMIN (GLUCOPHAGE) 1000 MG tablet Take 1,000 mg by mouth 2 (two) times daily with a meal.     [provider]  metoprolol succinate (TOPROL-XL) 25 MG 24 hr tablet TAKE 3 TABLETS BY MOUTH TWICE A DAY WITH OR IMMEDIATELY FOLLOWING A MEAL 08/12/20   Lorretta Harp, MD  nitroGLYCERIN (NITROSTAT) 0.4 MG SL tablet Place 1 tablet (0.4 mg total) under the tongue every 5 (five) minutes as needed. 06/18/19   Lorretta Harp, MD  Propylene Glycol (SYSTANE BALANCE) 0.6 % SOLN Place 1 drop into both eyes 4 (four) times daily as needed (dry eyes).    [provider]  sacubitril-valsartan (ENTRESTO) 49-51 MG Take 1 tablet by mouth 2 (two) times daily. 10/26/20   Rafael Bihari, FNP  spironolactone (ALDACTONE) 25 MG tablet TAKE 1 TABLET (25 MG TOTAL) BY MOUTH DAILY. 07/19/21   Bensimhon, [name], MD  torsemide (DEMADEX) 20 MG tablet Take 2 tablets (40 mg total) by mouth 2 (two) times daily. 05/08/21   [place], Maricela Bo, FNP  TRESIBA FLEXTOUCH 200 UNIT/ML FlexTouch Pen Inject 36 Units into the skin in the morning. 09/29/20   [provider]  VASCEPA 1 g capsule TAKE 2 CAPSULES BY MOUTH TWICE A DAY 06/08/21   Bensimhon, [name], MD    Physical Exam    Vital Signs:  PHINEAS MCENROE does not have vital signs available for review today.  Given telephonic nature of communication, physical exam is limited. AAOx3. NAD. Normal affect.  Speech and respirations are unlabored.  Accessory Clinical Findings    None  Assessment & Plan    1.  Preoperative Cardiovascular Risk Assessment:  According to the Revised Cardiac Risk Index (RCRI), his Perioperative Risk of Major Cardiac Event is (%): 11. His Functional Capacity in METs is: 5.13 according to the [facility] Activity Status Index (DASI). Therefore, based on ACC/AHA guidelines, patient would be at acceptable risk for the planned procedure without further cardiovascular testing.  Per office protocol, from a cardiology standpoint, he may hold Aspirin for 7 days and Plavix for 5 days prior to procedure. Please resume Aspirin and Plavix as  soon as possible postprocedure, at the discretion of the surgeon.  Given recent vascular intervention, we also recommend vascular surgery input on holding aspirin and Plavix prior to surgery.   A copy of this note will be routed to requesting surgeon.  Time:   Today, I have spent 4 minutes with the patient with telehealth technology discussing medical history, symptoms, and management plan.     Lenna Sciara, NP  11/02/2021, 2:11 PM

## 2021-11-03 ENCOUNTER — Ambulatory Visit
Admission: RE | Admit: 2021-11-03 | Discharge: 2021-11-03 | Disposition: A | Discharge status: Home / Self Care | Payer: Commercial Managed Care - PPO | Source: Ambulatory Visit | Attending: Surgery | Admitting: Surgery

## 2021-11-03 DIAGNOSIS — E210 Primary hyperparathyroidism: Secondary | ICD-10-CM

## 2021-11-08 ENCOUNTER — Ambulatory Visit: Payer: Self-pay | Admitting: Surgery

## 2021-11-08 NOTE — Progress Notes (Signed)
USN and nuclear med sestamibi scan both confirm a right inferior parathyroid adenoma.  Patient is a good candidate for minimally invasive parathyroidectomy.  Will require pre-op cardiac evaluation and clearance.  Will enter orders for surgery and send to schedulers to contact patient as discussed in the office.  Lester, MD [facility] [place] practice Office: [phone]

## 2021-11-10 NOTE — Progress Notes (Addendum)
COVID Vaccine received:  '[]'$  No '[x]'$  Yes Date of any COVID positive Test in last 90 days:  PCP - Okey Dupre, MD Cardiologist - Quay Burow, MD   Cardiac clearance- Diona Browner, NP 11-02-21 note Endocrinologist- Dr. [name]    Chest x-ray -  EKG -  05-08-21 Epic Stress Test - 01-29-20 Epic ECHO - 05-22-21  Epic Cardiac Cath - 02-15-2020  Dr. Gwenlyn Found   Pacemaker/ICD device     '[]'$  N/A Spinal Cord Stimulator:'[]'$  No '[]'$  Yes      (Remind patient to bring remote DOS) Other Implants:   History of Sleep Apnea? '[]'$  No '[x]'$  Yes  mild per patient Sleep Study Date:   CPAP used?- '[]'$  No '[]'$  Yes  (Instruct to bring their mask & Tubing)  Insulin Pump- continous injection (Humulin R); pump setting 125 delivers up to 630 units max daily dosage via pump.  Does the patient monitor blood sugar? '[]'$  No '[]'$  Yes  '[]'$  N/A Does patient have a [employer] or Dexacom? '[]'$  No '[]'$  Yes   Fasting Blood Sugar Ranges-  Checks Blood Sugar _____ times a day  Blood Thinner Instructions: Aspirin Instructions: Last Dose:  ERAS Protocol Ordered: '[]'$  No  '[]'$  Yes PRE-SURGERY '[]'$  ENSURE  '[]'$  G2   Comments:   Activity level: Patient can / can not climb a flight of stairs without difficulty;  '[]'$  No CP  '[]'$  No SOB,  but would have ______   Anesthesia review: CAD-CABG x 4, CHF,Hx AAA repair, Karlene Lineman, OSA, non-hodgkins' lymphoma  Patient denies shortness of breath, fever, cough and chest pain at PAT appointment.  Patient verbalized understanding and agreement to the Pre-Surgical Instructions that were given to them at this PAT appointment. Patient was also educated of the need to review these PAT instructions again prior to his/her surgery.I reviewed the appropriate phone numbers to call if they have any and questions or concerns.

## 2021-11-14 ENCOUNTER — Encounter (HOSPITAL_COMMUNITY)
Admission: RE | Admit: 2021-11-14 | Discharge: 2021-11-14 | Disposition: A | Discharge status: Home / Self Care | Payer: Commercial Managed Care - PPO | Source: Ambulatory Visit | Attending: Surgery | Admitting: Surgery

## 2021-11-14 ENCOUNTER — Other Ambulatory Visit: Payer: Self-pay

## 2021-11-14 ENCOUNTER — Encounter (HOSPITAL_COMMUNITY): Payer: Self-pay

## 2021-11-14 VITALS — BP 138/76 | HR 90 | Temp 98.7°F | Resp 22 | Ht 70.0 in | Wt 240.0 lb

## 2021-11-14 DIAGNOSIS — I110 Hypertensive heart disease with heart failure: Secondary | ICD-10-CM | POA: Diagnosis not present

## 2021-11-14 DIAGNOSIS — Z01818 Encounter for other preprocedural examination: Secondary | ICD-10-CM | POA: Diagnosis present

## 2021-11-14 DIAGNOSIS — E210 Primary hyperparathyroidism: Secondary | ICD-10-CM | POA: Insufficient documentation

## 2021-11-14 DIAGNOSIS — E119 Type 2 diabetes mellitus without complications: Secondary | ICD-10-CM | POA: Diagnosis not present

## 2021-11-14 DIAGNOSIS — Z794 Long term (current) use of insulin: Secondary | ICD-10-CM | POA: Diagnosis not present

## 2021-11-14 DIAGNOSIS — Z8572 Personal history of non-Hodgkin lymphomas: Secondary | ICD-10-CM | POA: Diagnosis not present

## 2021-11-14 DIAGNOSIS — G4730 Sleep apnea, unspecified: Secondary | ICD-10-CM | POA: Diagnosis not present

## 2021-11-14 DIAGNOSIS — Z951 Presence of aortocoronary bypass graft: Secondary | ICD-10-CM | POA: Diagnosis not present

## 2021-11-14 DIAGNOSIS — I10 Essential (primary) hypertension: Secondary | ICD-10-CM

## 2021-11-14 LAB — HEMOGLOBIN A1C
Hgb A1c MFr Bld: 7.2 % — ABNORMAL HIGH (ref 4.8–5.6)
Mean Plasma Glucose: 159.94 mg/dL

## 2021-11-14 LAB — CBC
HCT: 35.2 % — ABNORMAL LOW (ref 39.0–52.0)
Hemoglobin: 10.9 g/dL — ABNORMAL LOW (ref 13.0–17.0)
MCH: 26.1 pg (ref 26.0–34.0)
MCHC: 31 g/dL (ref 30.0–36.0)
MCV: 84.4 fL (ref 80.0–100.0)
Platelets: 105 10*3/uL — ABNORMAL LOW (ref 150–400)
RBC: 4.17 MIL/uL — ABNORMAL LOW (ref 4.22–5.81)
RDW: 16.4 % — ABNORMAL HIGH (ref 11.5–15.5)
WBC: 5.3 10*3/uL (ref 4.0–10.5)
nRBC: 0 % (ref 0.0–0.2)

## 2021-11-14 LAB — COMPREHENSIVE METABOLIC PANEL
ALT: 73 U/L — ABNORMAL HIGH (ref 0–44)
AST: 44 U/L — ABNORMAL HIGH (ref 15–41)
Albumin: 3.5 g/dL (ref 3.5–5.0)
Alkaline Phosphatase: 69 U/L (ref 38–126)
Anion gap: 6 (ref 5–15)
BUN: 31 mg/dL — ABNORMAL HIGH (ref 6–20)
CO2: 22 mmol/L (ref 22–32)
Calcium: 10 mg/dL (ref 8.9–10.3)
Chloride: 107 mmol/L (ref 98–111)
Creatinine, Ser: 1.29 mg/dL — ABNORMAL HIGH (ref 0.61–1.24)
GFR, Estimated: >60 mL/min (ref >60)
Glucose, Bld: 277 mg/dL — ABNORMAL HIGH (ref 70–99)
Potassium: 4.2 mmol/L (ref 3.5–5.1)
Sodium: 135 mmol/L (ref 135–145)
Total Bilirubin: 0.6 mg/dL (ref 0.3–1.2)
Total Protein: 7.3 g/dL (ref 6.5–8.1)

## 2021-11-14 NOTE — Patient Instructions (Addendum)
DUE TO SPACE LIMITATIONS, ONLY TWO VISITORS  (aged 55 and older) ARE ALLOWED TO COME WITH YOU AND STAY IN THE WAITING ROOM DURING YOUR PRE OP AND PROCEDURE.   **NO VISITORS ARE ALLOWED IN THE SHORT STAY AREA OR RECOVERY ROOM!!**   You are not required to quarantine at this time prior to your surgery. However, you must do this: Hand Hygiene often Do NOT share personal items Notify your provider if you are in close contact with someone who has COVID or you develop fever 100.4 or greater, new onset of sneezing, cough, sore throat, shortness of breath or body aches.       Your procedure is scheduled on:  Thursday   November 23, 2021  Report to [facility] Main Entrance.  Report to admitting at: 05:45    AM  +++++Call this number if you have any questions or problems the morning of surgery [phone]  Do not eat food :After Midnight the night prior to your surgery/procedure.  After Midnight you may have the following liquids until  05:00 AM DAY OF SURGERY  Clear Liquid Diet Water Black Coffee (sugar ok, NO MILK/CREAM OR CREAMERS)  Tea (sugar ok, NO MILK/CREAM OR CREAMERS) regular and decaf                             Plain Jell-O (NO RED)                                           Fruit ices (not with fruit pulp, NO RED)                                     Popsicles (NO RED)                                                                  Juice: apple, WHITE grape, WHITE cranberry Sports drinks like Gatorade (NO RED)           FOLLOW ANY ADDITIONAL PRE OP INSTRUCTIONS YOU RECEIVED FROM YOUR SURGEON'S OFFICE!!!   Oral Hygiene is also important to reduce your risk of infection.        Remember - BRUSH YOUR TEETH THE MORNING OF SURGERY WITH YOUR REGULAR TOOTHPASTE   Aspirin- hold for 7 days prior to your surgery PLAVIX - hold for 5 days prior to your surgery  Patients with Insulin Pumps  For patients with Insulin Pumps: Contact your diabetes doctor for specific  instructions before surgery. Decrease basal insulin rates by 20% at midnight the night before surgery. Do not remove your insulin pump prior to arrival to short stay the morning of surgery.  Anesthesia will instruct you on when to remove your pump. Note that if your surgery is planned to be longer than 2 hours, your insulin pump will be removed and intravenous (IV) insulin will be started and managed by the nurses and anesthesiologist. You will be able to restart your insulin pump once you are awake and able to manage it. Make sure to bring  insulin pump supplies to the hospital with you in case your site needs to be changed.           Metformin- Day before surgery:  take usual dosage,   DAY OF SURGERY: Do not take Metformin         Tresiba-  Day before surgery: take usual morning dosage   DAY of SURGERY:   DM2  Take 17 units  Take ONLY these medicines the morning of surgery with A SIP OF WATER: Isosorbide, fenofibrate, metoprolol, and if needed, you may use your Systane eye drops.                     You may not have any metal on your body including  jewelry, and body piercing  Do not wear lotions, powders, cologne, or deodorant  Men may shave face and neck.  Contacts, Hearing Aids, dentures or bridgework may not be worn into surgery.    DO NOT [place]. PHARMACY WILL DISPENSE MEDICATIONS LISTED ON YOUR MEDICATION LIST TO YOU DURING YOUR ADMISSION [place]!   Patients discharged on the day of surgery will not be allowed to drive home.  Someone NEEDS to stay with you for the first 24 hours after anesthesia.  Special Instructions: Bring a copy of your healthcare power of attorney and living will documents the day of surgery, if you wish to have them scanned into your [facility] Records- EPIC  Please read over the following fact sheets you were given: IF YOU HAVE QUESTIONS ABOUT YOUR PRE-OP INSTRUCTIONS, PLEASE CALL [phone]   ([place])   [place] - Preparing for Surgery Before surgery, you can play an important role.  Because skin is not sterile, your skin needs to be as free of germs as possible.  You can reduce the number of germs on your skin by washing with CHG (chlorahexidine gluconate) soap before surgery.  CHG is an antiseptic cleaner which kills germs and bonds with the skin to continue killing germs even after washing. Please DO NOT use if you have an allergy to CHG or antibacterial soaps.  If your skin becomes reddened/irritated stop using the CHG and inform your nurse when you arrive at Short Stay. Do not shave (including legs and underarms) for at least 48 hours prior to the first CHG shower.  You may shave your face/neck.  Please follow these instructions carefully:  1.  Shower with CHG Soap the night before surgery and the  morning of surgery.  2.  If you choose to wash your hair, wash your hair first as usual with your normal  shampoo.  3.  After you shampoo, rinse your hair and body thoroughly to remove the shampoo.                             4.  Use CHG as you would any other liquid soap.  You can apply chg directly to the skin and wash.  Gently with a scrungie or clean washcloth.  5.  Apply the CHG Soap to your body ONLY FROM THE NECK DOWN.   Do not use on face/ open                           Wound or open sores. Avoid contact with eyes, ears mouth and genitals (private parts).  Wash face,  Genitals (private parts) with your normal soap.             6.  Wash thoroughly, paying special attention to the area where your  surgery  will be performed.  7.  Thoroughly rinse your body with warm water from the neck down.  8.  DO NOT shower/wash with your normal soap after using and rinsing off the CHG Soap.            9.  Pat yourself dry with a clean towel.            10.  Wear clean pajamas.            11.  Place clean sheets on your bed the night of your first shower and do not  sleep  with pets.  ON THE DAY OF SURGERY : Do not apply any lotions/deodorants the morning of surgery.  Please wear clean clothes to the hospital/surgery center.    FAILURE TO FOLLOW THESE INSTRUCTIONS MAY RESULT IN THE CANCELLATION OF YOUR SURGERY  PATIENT SIGNATURE_________________________________  NURSE SIGNATURE__________________________________  ________________________________________________________________________

## 2021-11-15 NOTE — Anesthesia Preprocedure Evaluation (Addendum)
Anesthesia Evaluation  Patient identified by MRN, date of birth, ID band Patient awake    Reviewed: Allergy & Precautions, NPO status , Patient's Chart, lab work & pertinent test results, reviewed documented beta blocker date and time   History of Anesthesia Complications Negative for: history of anesthetic complications  Airway Mallampati: II  TM Distance: >3 FB Neck ROM: Full    Dental  (+)    Pulmonary sleep apnea ,    Pulmonary exam normal        Cardiovascular hypertension, Pt. on home beta blockers + angina with exertion + CAD, + Cardiac Stents (2018), + CABG (2014), + Peripheral Vascular Disease, +CHF and + DOE  Normal cardiovascular exam  Echo 05/22/2021: EF 50%, mild LVH, RV systolic function mildly reduced, mild dilatation of aortic root measuring 77m   RBBB, LAFB   Neuro/Psych negative neurological ROS  negative psych ROS   GI/Hepatic negative GI ROS, Neg liver ROS,   Endo/Other  diabetes (insulin pump), Type 2, Insulin Dependent, Oral Hypoglycemic Agentsprimary hyperparathyroidism  Renal/GU Renal InsufficiencyRenal disease (Cr 1.29)  negative genitourinary   Musculoskeletal negative musculoskeletal ROS (+)   Abdominal   Peds  Hematology  (+) Blood dyscrasia (Hgb 10.9, Plt 105k), anemia ,   Anesthesia Other Findings Day of surgery medications reviewed with patient.  Reproductive/Obstetrics negative OB ROS                           Anesthesia Physical Anesthesia Plan  ASA: 4  Anesthesia Plan: General   Post-op Pain Management: Tylenol PO (pre-op)*   Induction: Intravenous  PONV Risk Score and Plan: 2 and Treatment may vary due to age or medical condition, Ondansetron, Dexamethasone and Midazolam  Airway Management Planned: Oral ETT  Additional Equipment: None  Intra-op Plan:   Post-operative Plan: Extubation in OR  Informed Consent: I have reviewed the patients  History and Physical, chart, labs and discussed the procedure including the risks, benefits and alternatives for the proposed anesthesia with the patient or authorized representative who has indicated his/her understanding and acceptance.     Dental advisory given  Plan Discussed with: CRNA  Anesthesia Plan Comments: (See PAT note 11/14/2021)      Anesthesia Quick Evaluation

## 2021-11-15 NOTE — Progress Notes (Signed)
Anesthesia Chart Review   Case: [id number] Date/Time: 11/23/21 0745   Procedure: RIGHT INFERIOR PARATHYROIDECTOMY (Right)   Anesthesia type: General   Pre-op diagnosis: PRIMARY HYPERPARATHYROIDISM   Location: [facility] ROOM 04 / [facility]   Surgeons: Armandina Gemma, MD       DISCUSSION:56 y.o. never smoker with h/o HTN, DM (insulin pump in place), Non-Hodgkin's lymphoma, CAD (s/p CABG in 2014,  DES-SVG-RCA in 2018), CHF, sleep apnea, primary hyperparathyroidism scheduled for above procedure 11/23/2021 with Dr. Armandina Gemma.   Pt seen by cardiology 11/02/2021. Per OV note, "According to the Revised Cardiac Risk Index (RCRI), his Perioperative Risk of Major Cardiac Event is (%): 11. His Functional Capacity in METs is: 5.13 according to the Duke Activity Status Index (DASI). Therefore, based on ACC/AHA guidelines, patient would be at acceptable risk for the planned procedure without further cardiovascular testing.   Per office protocol, from a cardiology standpoint, he may hold Aspirin for 7 days and Plavix for 5 days prior to procedure. Please resume Aspirin and Plavix as soon as possible postprocedure, at the discretion of the surgeon.  Given recent vascular intervention, we also recommend vascular surgery input on holding aspirin and Plavix prior to surgery."  Pt last seen by vascular surgery 10/02/2021. Left lower extremity wound almost healed, he was advised to continue with wet to dry dressings until healed.   Pt to hold Plavix 5 days before procedure.   Anticipate pt can proceed with planned procedure barring acute status change.   VS: BP 138/76   Pulse 90   Temp 37.1 C (Oral)   Resp (!) 22   Ht '5\' 10"'$  (1.778 m)   Wt 108.9 kg   SpO2 96%   BMI 34.44 kg/m   PROVIDERS: Kathalene Frames, MD is PCP   Cardiologist - Quay Burow, MD LABS: Labs reviewed: Acceptable for surgery. (all labs ordered are listed, but only abnormal results are displayed)  Labs Reviewed  HEMOGLOBIN A1C -  Abnormal; Notable for the following components:      Result Value   Hgb A1c MFr Bld 7.2 (*)    All other components within normal limits  COMPREHENSIVE METABOLIC PANEL - Abnormal; Notable for the following components:   Glucose, Bld 277 (*)    BUN 31 (*)    Creatinine, Ser 1.29 (*)    AST 44 (*)    ALT 73 (*)    All other components within normal limits  CBC - Abnormal; Notable for the following components:   RBC 4.17 (*)    Hemoglobin 10.9 (*)    HCT 35.2 (*)    RDW 16.4 (*)    Platelets 105 (*)    All other components within normal limits     IMAGES:   EKG:   CV: Echo 05/22/2021  1. Left ventricular ejection fraction by 3D volume is 50 %. The left  ventricle has low normal function. The left ventricle demonstrates  regional wall motion abnormalities (see scoring diagram/findings for  description). There is mild concentric left  ventricular hypertrophy. Indeterminate diastolic filling due to E-A  fusion. Elevated left atrial pressure.   2. Right ventricular systolic function is mildly reduced. The right  ventricular size is normal.   3. The mitral valve is normal in structure. Trivial mitral valve  regurgitation. No evidence of mitral stenosis.   4. The aortic valve is normal in structure. Aortic valve regurgitation is  trivial. No aortic stenosis is present.   5. There is mild dilatation of  the aortic root, measuring 37 mm. There is  borderline dilatation of the ascending aorta, measuring 36 mm.   6. The inferior vena cava is normal in size with greater than 50%  respiratory variability, suggesting right atrial pressure of 3 mmHg.  Past Medical History:  Diagnosis Date   Abnormal cardiovascular stress test 02/06/2013   Anemia    occasional - no problems currently(10/02/2011)   Bilateral renal cysts 06/16/2011   states no known problems   Blood transfusion without reported diagnosis    CAD (coronary artery disease), LAD 90%, 1st diag 95%, LCX 70% wth AV groove 90%,  RCA 40-50% mid and 80% long distal stenosis 02/03/13 02/04/2013   Chest pain    positive Myoview stress test   CHF (congestive heart failure) (HCC)    Coronary artery calcification seen on CAT scan    Diabetes mellitus    IDDM   Fatty liver disease, nonalcoholic 93/81/0175   Hyperlipidemia    Hypertension    states no dx. of HTN, takes med. to protect kidneys due to DM   Lateral meniscus tear 09/2011   left   Loose body in knee 09/2011   loose bodies left knee   Non Hodgkin's lymphoma ([place]) 1991   Pancreatitis    occasional - last episode 06/2011   Pneumonia    S/P CABG x 4, 02/05/13 LIMA-LAD; LT. RADIAL-OM;VG-DIAG; VG-PDA 02/06/2013   10/18 3/4 patent grafts (occluded SVG-->Diag), PCI/DESx 3 SVG-->RCA, normal EF   Sleep apnea    mild per patient   Splenomegaly, congestive, chronic    Stuffy and runny nose 10/02/2011   yellow drainage from nose    Past Surgical History:  Procedure Laterality Date   ABDOMINAL AORTIC ANEURYSM REPAIR     ABDOMINAL AORTOGRAM W/LOWER EXTREMITY N/A 07/18/2021   Procedure: ABDOMINAL AORTOGRAM W/LOWER EXTREMITY;  Surgeon: Serafina Mitchell, MD;  Location: [facility];  Service: Cardiovascular;  Laterality: N/A;   ANTERIOR CERVICAL DECOMP/DISCECTOMY FUSION N/A 03/26/2018   Procedure: ANTERIOR CERVICAL DECOMPRESSION FUSION CERVICAL 5-6 WITH INSTRUMENTATION AND ALLOGRAFT;  Surgeon: Phylliss Bob, MD;  Location: [place];  Service: Orthopedics;  Laterality: N/A;   CARDIAC CATHETERIZATION     CORONARY ARTERY BYPASS GRAFT N/A 02/05/2013   Procedure: CORONARY ARTERY BYPASS GRAFTING (CABG);  Surgeon: Ivin Poot, MD;  Location: [place];  Service: Open Heart Surgery;  Laterality: N/A;  Coronary artery bypass graft on pump times four using left internal mammary artery and right greater saphenous vein via endovein harvest and left radial artery harvest.    CORONARY STENT INTERVENTION  12/17/2016    PCI and drug-eluting stenting of the mid and distal RCA SVG     CORONARY STENT INTERVENTION N/A 12/17/2016   Procedure: CORONARY STENT INTERVENTION;  Surgeon: Lorretta Harp, MD;  Location: [place] [facility];  Service: Cardiovascular;  Laterality: N/A;   ELBOW SURGERY     FEMORAL-POPLITEAL BYPASS GRAFT Left 07/28/2021   Procedure: LEFT FEMORAL-BELOW KNEE POPLITEAL ARTERY BYPASS GRAFT;  Surgeon: Serafina Mitchell, MD;  Location: [place];  Service: Vascular;  Laterality: Left;   HERNIA REPAIR     inguinal    herniated disc     INTRAOPERATIVE TRANSESOPHAGEAL ECHOCARDIOGRAM N/A 02/05/2013   Procedure: INTRAOPERATIVE TRANSESOPHAGEAL ECHOCARDIOGRAM;  Surgeon: Ivin Poot, MD;  Location: [place];  Service: Open Heart Surgery;  Laterality: N/A;   KNEE ARTHROSCOPY  10/09/2011   Procedure: ARTHROSCOPY KNEE;  Surgeon: Nita Sells, MD;  Location: [place];  Service: Orthopedics;  Laterality: Left;   LEFT HEART CATH AND CORS/GRAFTS ANGIOGRAPHY N/A 12/17/2016   Procedure: LEFT HEART CATH AND CORS/GRAFTS ANGIOGRAPHY;  Surgeon: Lorretta Harp, MD;  Location: [place] [facility];  Service: Cardiovascular;  Laterality: N/A;   LEFT HEART CATHETERIZATION WITH CORONARY ANGIOGRAM N/A 02/03/2013   Procedure: LEFT HEART CATHETERIZATION WITH CORONARY ANGIOGRAM;  Surgeon: Lorretta Harp, MD;  Location: [facility] C[facility] LAB;  Service: Cardiovascular;  Laterality: N/A;   NASAL SEPTUM SURGERY     RADIAL ARTERY HARVEST Left 02/05/2013   Procedure: RADIAL ARTERY HARVEST;  Surgeon: Ivin Poot, MD;  Location: [place];  Service: Vascular;  Laterality: Left;   RIGHT/LEFT HEART CATH AND CORONARY/GRAFT ANGIOGRAPHY N/A 02/15/2020   Procedure: RIGHT/LEFT HEART CATH AND CORONARY/GRAFT ANGIOGRAPHY;  Surgeon: Lorretta Harp, MD;  Location: [place] [facility];  Service: Cardiovascular;  Laterality: N/A;   TEE WITHOUT CARDIOVERSION N/A 02/18/2020   Procedure: TRANSESOPHAGEAL ECHOCARDIOGRAM (TEE);  Surgeon: Jolaine Artist, MD;  Location: [facility] ENDOSCOPY;  Service:  Cardiovascular;  Laterality: N/A;   TIBIA BONE BIOPSY  x 3   left   TONSILLECTOMY     VEIN HARVEST Left 07/28/2021   Procedure: HARVEST OF GREAT SAPHENOUS VEIN;  Surgeon: Serafina Mitchell, MD;  [facility];  Service: Vascular;  Laterality: Left;    MEDICATIONS:  aspirin 81 MG chewable tablet   clopidogrel (PLAVIX) 75 MG tablet   Continuous Blood Gluc Receiver (DEXCOM G6 RECEIVER) DEVI   fenofibrate 160 MG tablet   Glucose Blood (BAYER CONTOUR NEXT TEST VI)   insulin regular human CONCENTRATED (HUMULIN R) 500 UNIT/ML injection   isosorbide mononitrate (IMDUR) 30 MG 24 hr tablet   metFORMIN (GLUCOPHAGE) 1000 MG tablet   metoprolol succinate (TOPROL-XL) 25 MG 24 hr tablet   nitroGLYCERIN (NITROSTAT) 0.4 MG SL tablet   Propylene Glycol (SYSTANE BALANCE) 0.6 % SOLN   sacubitril-valsartan (ENTRESTO) 49-51 MG   spironolactone (ALDACTONE) 25 MG tablet   torsemide (DEMADEX) 20 MG tablet   TRESIBA FLEXTOUCH 200 UNIT/ML FlexTouch Pen   VASCEPA 1 g capsule   No current facility-administered medications for this encounter.    Konrad Felix Ward, PA-C [facility]-Surgical Testing [phone]

## 2021-11-16 ENCOUNTER — Encounter (HOSPITAL_COMMUNITY): Payer: Self-pay | Admitting: Surgery

## 2021-11-16 NOTE — H&P (Signed)
REFERRING PHYSICIAN: Birdena Crandall, MD  PROVIDER: Reanna Scoggin Charlotta Newton, MD   Chief Complaint: New Consultation (Primary hyperparathyroidism)  History of Present Illness:  Patient is referred by Dr. [name] for surgical evaluation and recommendations regarding newly diagnosed primary hyperparathyroidism. Patient was noted on routine laboratory studies to have an elevated serum calcium level. Recent levels have ranged from 10.3-10.6. Intact PTH level was checked and measured 85. Patient underwent a nuclear medicine parathyroid scan on Aug 07, 2021. This localized a right inferior parathyroid adenoma. Patient has not had other imaging studies. Patient has a significant history of coronary artery disease. He notes fatigue. He denies nephrolithiasis. He has not had a bone density scan. Patient is on Plavix and aspirin for his coronary artery disease. Patient works at [employer].  Review of Systems: A complete review of systems was obtained from the patient. I have reviewed this information and discussed as appropriate with the patient. See HPI as well for other ROS.  Review of Systems  Constitutional: Positive for malaise/fatigue.  HENT: Negative.  Eyes: Negative.  Respiratory: Negative.  Cardiovascular: Positive for leg swelling.  Gastrointestinal: Negative.  Genitourinary: Negative.  Musculoskeletal: Negative.  Skin: Negative.  Neurological: Negative.  Endo/Heme/Allergies: Negative.  Psychiatric/Behavioral: Negative.   Medical History: Past Medical History:  Diagnosis Date  Anemia  CHF (congestive heart failure) (CMS-HCC)  Diabetes mellitus type II (CMS-HCC)  Heart failure (CMS-HCC)  Hypertension  Hypertriglyceridemia  Non Hodgkin's lymphoma (CMS-HCC) 1990  of left tibia, s/p chemotherapy at [facility]  Nonalcoholic steatohepatitis (NASH)  Pancreatitis  Peripheral neuropathy, secondary to drugs or chemicals  vincristine with chemotherapy  Sleep apnea  Thyroid  disease   Patient Active Problem List  Diagnosis  Chylomicronemia syndrome  NAFLD (nonalcoholic fatty liver disease) with cirrhosis by imaging studies  Encounter for long-term (current) use of medications  Diabetes type 2, uncontrolled  H/O non-Hodgkin's lymphoma limited to tibia, treated at [place] in 1990 and considered cured  History of acute pancreatitis more than 10 hospitalizations since age 56  Coronary artery disease,onset with CP and 4 vessel CABG in 01/2013  Common lipodystrophy  Retinal macroaneurysm of left eye  Moderate nonproliferative diabetic retinopathy of right eye without macular edema associated with type 2 diabetes mellitus (CMS-HCC)  Moderate nonproliferative diabetic retinopathy of left eye with macular edema associated with diabetes mellitus due to underlying condition (CMS-HCC)  Acute kidney injury (CMS-HCC)  Bilateral renal cysts  Cellulitis  Cellulitis of right leg  Chest pain  Chronic combined systolic and diastolic CHF (congestive heart failure) (CMS-HCC)  Chronic nonalcoholic liver disease  Chronic pancreatitis (CMS-HCC)  Chronic renal impairment  Dehydration  Diastolic dysfunction  Displacement of cervical intervertebral disc without myelopathy  Dyslipidemia  Dyspnea on exertion  Edema of both legs  Elevated troponin  Essential hypertension  Hypercalcemia  Hyperglycemia due to type 2 diabetes mellitus (CMS-HCC)  Hyperkalemia  Hyperlipidemia  Hyponatremia  Lactic acidosis  Microcytosis  Other malignant lymphomas, unspecified site, extranodal and solid organ sites (CMS-HCC)  Personal history of lymphatic and hematopoietic neoplasm  Presence of insulin pump (external) (internal)  Proteinuria  Radiculopathy  Right bundle branch block  S/P CABG x 4  Sepsis (CMS-HCC)  Splenomegaly  Thrombocytopenia (CMS-HCC)  Systolic heart failure (CMS-HCC)  Type 2 diabetes mellitus treated with insulin (CMS-HCC)  Hypertensive retinopathy of both eyes  Severe  nonproliferative diabetic retinopathy of left eye with macular edema associated with diabetes mellitus due to underlying condition (CMS-HCC)  Primary hyperparathyroidism (CMS-HCC)   Past  Surgical History:  Procedure Laterality Date  BIOPSY SOFT TISSUE THIGH/KNEE DEEP  BYPASS GRAFT AORTOBI-ILIAC  quad  deviated septum repair  elbow surgery  left  INGUINAL HERNIA REPAIR Bilateral  spinal fushion  tonsil    Allergies  Allergen Reactions  Metoclopramide Other (See Comments)  Only can tolerate in low doses, in higher doses it has the opposite effect  Other Other (See Comments)  Cellulitis with Jardiance and Farxiga   Current Outpatient Medications on File Prior to Visit  Medication Sig Dispense Refill  acetaminophen (TYLENOL) 325 MG tablet Take by mouth.  aspirin 325 MG EC tablet Take 325 mg by mouth once daily.   BD INSULIN SYRINGE ULT-FINE II 1 mL 31 x 5/16" syringe Reported on 03/22/2015  benzonatate (TESSALON) 200 MG capsule Take 200 mg by mouth 2 (two) times daily as needed for Cough  clopidogreL (PLAVIX) 75 mg tablet 1 tablet  CONCENTRATED insulin REGULAR (HUMULIN R U-500 "CONCENTRATED") 500 unit/mL injection Inject subcutaneously. Via Pump  FARXIGA 5 mg Tab tablet Take 5 mg by mouth once daily 1  fenofibrate 160 MG tablet daily.  ferrous sulfate 325 (65 FE) MG tablet Take 325 mg by mouth daily with breakfast  icosapent ethyL (VASCEPA) 1 gram capsule Take 2 g by mouth 2 (two) times daily 2grams twice a day  insulin DEGLUDEC (TRESIBA FLEXTOUCH) pen injector (concentration 200 units/mL) 26u  isosorbide mononitrate (IMDUR) 30 MG ER tablet  metFORMIN (GLUCOPHAGE) 1000 MG tablet 2 (two) times daily.  metoprolol succinate (TOPROL-XL) 50 MG XL tablet Take 50 mg by mouth once daily.  NITROGLYCERIN ORAL Take by mouth As needed  omega-3 fatty acids/fish oil 340-1,000 mg capsule Take by mouth  ramipril (ALTACE) 5 MG capsule Take 5 mg by mouth once daily  rosuvastatin (CRESTOR) 10 MG  tablet Take 10 mg by mouth once daily  sacubitriL-valsartan (ENTRESTO) 24-26 mg tablet Take 1 tablet by mouth 2 (two) times daily  simvastatin (ZOCOR) 10 MG tablet Take 10 mg by mouth once daily  spironolactone (ALDACTONE) 25 MG tablet Take 25 mg by mouth once daily Half a pill daily  TORsemide (DEMADEX) 20 MG tablet Take 20 mg by mouth 2 (two) times daily 2 tablets once daily   No current facility-administered medications on file prior to visit.   Family History  Adopted: Yes  Problem Relation Age of Onset  Hyperlipidemia (Elevated cholesterol) Neg Hx    Social History   Tobacco Use  Smoking Status Never  Smokeless Tobacco Never    Social History   Socioeconomic History  Marital status: Unknown  Tobacco Use  Smoking status: Never  Smokeless tobacco: Never  Substance and Sexual Activity  Alcohol use: No  Comment: no alcohol in 25 years  Drug use: No  Sexual activity: Yes  Partners: Female  Social History Narrative  Mr. Canepa lives with his wife and pre-teen daughter. He works for [employer] in a 7am to 3 pm job. He has never used tobacco products and never uses alcohol. His maximum lifetime weight is 237 lbs. He walks frequently in his job. No sugar drinks.   Objective:   Vitals:  BP: 130/70  Pulse: 89  Temp: 36.4 C (97.6 F)  SpO2: 98%  Weight: (!) 109 kg (240 lb 6.4 oz)  Height: 177.8 cm ('5\' 10"'$ )   Body mass index is 34.49 kg/m.  Physical Exam   GENERAL APPEARANCE Comfortable, no acute issues Development: normal Gross deformities: none  SKIN Rash, lesions, ulcers: none Induration, erythema: none Nodules:  none palpable  EYES Conjunctiva and lids: normal Pupils: equal and reactive  EARS, NOSE, MOUTH, THROAT External ears: no lesion or deformity External nose: no lesion or deformity Hearing: grossly normal  NECK Symmetric: yes Trachea: midline Thyroid: no palpable nodules in the thyroid bed  CHEST Respiratory effort: normal Retraction  or accessory muscle use: no Breath sounds: normal bilaterally Rales, rhonchi, wheeze: none  CARDIOVASCULAR Auscultation: regular rhythm, normal rate Murmurs: none Pulses: radial pulse 2+ palpable Lower extremity edema: Moderate to severe lower extremity edema below the knees  ABDOMEN Not assessed  GENITOURINARY/RECTAL Not assessed  MUSCULOSKELETAL Station and gait: normal Digits and nails: no clubbing or cyanosis Muscle strength: grossly normal all extremities Range of motion: grossly normal all extremities Deformity: Healing surgical wounds lower extremities  LYMPHATIC Cervical: none palpable Supraclavicular: none palpable  PSYCHIATRIC Oriented to person, place, and time: yes Mood and affect: normal for situation Judgment and insight: appropriate for situation    Assessment and Plan:   Primary hyperparathyroidism (CMS-HCC)  Patient is referred by his endocrinologist, Dr. [name], for surgical evaluation and management of newly diagnosed primary hyperparathyroidism.  Patient provided with a copy of "Parathyroid Surgery: Treatment for Your Parathyroid Gland Problem", published by [employer]ames, 12 pages. Book reviewed and explained to patient during visit today.  Patient has biochemical evidence of primary hyperparathyroidism. Nuclear medicine parathyroid scan with sestamibi localizes a right inferior adenoma. I would like to obtain an ultrasound examination of the neck in hopes of confirming the location of the adenoma and to rule out any concurrent thyroid disease. We will arrange for that study in the near future.  Today we discussed minimally invasive parathyroidectomy. We discussed traditional neck exploration. We discussed the rationale for the minimally invasive approach. We discussed risk and benefits of surgery including the risk of recurrent laryngeal nerve injury. We discussed the size and location of the surgical incision. We discussed the postoperative recovery  and return to work. The patient understands and wishes to proceed with surgery in the near future.  We will obtain the ultrasound study. We will also request cardiac clearance from the patient's cardiologist. We will then contact him to schedule his procedure in the near future.  Armandina Gemma, MD [facility] [place] practice Office: [phone]

## 2021-11-23 ENCOUNTER — Ambulatory Visit (HOSPITAL_BASED_OUTPATIENT_CLINIC_OR_DEPARTMENT_OTHER): Payer: Commercial Managed Care - PPO | Admitting: Certified Registered Nurse Anesthetist

## 2021-11-23 ENCOUNTER — Encounter (HOSPITAL_COMMUNITY): Payer: Self-pay | Admitting: Surgery

## 2021-11-23 ENCOUNTER — Ambulatory Visit (HOSPITAL_COMMUNITY): Payer: Commercial Managed Care - PPO

## 2021-11-23 ENCOUNTER — Other Ambulatory Visit: Payer: Self-pay

## 2021-11-23 ENCOUNTER — Ambulatory Visit (HOSPITAL_COMMUNITY)
Admission: RE | Admit: 2021-11-23 | Discharge: 2021-11-23 | Disposition: A | Discharge status: Home / Self Care | Payer: Commercial Managed Care - PPO | Source: Ambulatory Visit | Attending: Surgery | Admitting: Surgery

## 2021-11-23 ENCOUNTER — Encounter (HOSPITAL_COMMUNITY)
Admission: RE | Disposition: A | Discharge status: Home / Self Care | Payer: Self-pay | Source: Ambulatory Visit | Attending: Surgery

## 2021-11-23 DIAGNOSIS — G4730 Sleep apnea, unspecified: Secondary | ICD-10-CM | POA: Insufficient documentation

## 2021-11-23 DIAGNOSIS — I25119 Atherosclerotic heart disease of native coronary artery with unspecified angina pectoris: Secondary | ICD-10-CM | POA: Diagnosis not present

## 2021-11-23 DIAGNOSIS — Z951 Presence of aortocoronary bypass graft: Secondary | ICD-10-CM | POA: Diagnosis not present

## 2021-11-23 DIAGNOSIS — I509 Heart failure, unspecified: Secondary | ICD-10-CM

## 2021-11-23 DIAGNOSIS — E210 Primary hyperparathyroidism: Secondary | ICD-10-CM

## 2021-11-23 DIAGNOSIS — I10 Essential (primary) hypertension: Secondary | ICD-10-CM | POA: Diagnosis not present

## 2021-11-23 DIAGNOSIS — Z79899 Other long term (current) drug therapy: Secondary | ICD-10-CM | POA: Insufficient documentation

## 2021-11-23 DIAGNOSIS — E1151 Type 2 diabetes mellitus with diabetic peripheral angiopathy without gangrene: Secondary | ICD-10-CM | POA: Diagnosis not present

## 2021-11-23 DIAGNOSIS — Z7984 Long term (current) use of oral hypoglycemic drugs: Secondary | ICD-10-CM | POA: Diagnosis not present

## 2021-11-23 DIAGNOSIS — Z794 Long term (current) use of insulin: Secondary | ICD-10-CM | POA: Diagnosis not present

## 2021-11-23 DIAGNOSIS — Z7902 Long term (current) use of antithrombotics/antiplatelets: Secondary | ICD-10-CM | POA: Insufficient documentation

## 2021-11-23 DIAGNOSIS — E119 Type 2 diabetes mellitus without complications: Secondary | ICD-10-CM

## 2021-11-23 DIAGNOSIS — I110 Hypertensive heart disease with heart failure: Secondary | ICD-10-CM | POA: Insufficient documentation

## 2021-11-23 DIAGNOSIS — Z955 Presence of coronary angioplasty implant and graft: Secondary | ICD-10-CM | POA: Diagnosis not present

## 2021-11-23 DIAGNOSIS — D351 Benign neoplasm of parathyroid gland: Secondary | ICD-10-CM | POA: Insufficient documentation

## 2021-11-23 DIAGNOSIS — Z7982 Long term (current) use of aspirin: Secondary | ICD-10-CM | POA: Insufficient documentation

## 2021-11-23 HISTORY — PX: PARATHYROIDECTOMY: SHX19

## 2021-11-23 LAB — GLUCOSE, CAPILLARY
Glucose-Capillary: 171 mg/dL — ABNORMAL HIGH (ref 70–99)
Glucose-Capillary: 168 mg/dL — ABNORMAL HIGH (ref 70–99)

## 2021-11-23 SURGERY — PARATHYROIDECTOMY
Anesthesia: General | Site: Neck | Laterality: Right

## 2021-11-23 MED ORDER — CHLORHEXIDINE GLUCONATE CLOTH 2 % EX PADS: 6.0000 | MEDICATED_PAD | Freq: Once | CUTANEOUS | Status: DC

## 2021-11-23 MED ORDER — ROCURONIUM BROMIDE 10 MG/ML (PF) SYRINGE
PREFILLED_SYRINGE | INTRAVENOUS | Status: DC | PRN
  Administered 2021-11-23: 60 mg via INTRAVENOUS

## 2021-11-23 MED ORDER — MIDAZOLAM HCL 2 MG/2ML IJ SOLN
INTRAMUSCULAR | Status: AC
  Filled 2021-11-23: qty 2

## 2021-11-23 MED ORDER — PROPOFOL 10 MG/ML IV BOLUS
INTRAVENOUS | Status: AC
  Filled 2021-11-23: qty 20

## 2021-11-23 MED ORDER — MIDAZOLAM HCL 5 MG/5ML IJ SOLN
INTRAMUSCULAR | Status: DC | PRN
  Administered 2021-11-23: 2 mg via INTRAVENOUS

## 2021-11-23 MED ORDER — PHENYLEPHRINE 80 MCG/ML (10ML) SYRINGE FOR IV PUSH (FOR BLOOD PRESSURE SUPPORT)
PREFILLED_SYRINGE | INTRAVENOUS | Status: AC
  Filled 2021-11-23: qty 10

## 2021-11-23 MED ORDER — BUPIVACAINE HCL (PF) 0.5 % IJ SOLN
INTRAMUSCULAR | Status: AC
  Filled 2021-11-23: qty 30

## 2021-11-23 MED ORDER — LIDOCAINE 2% (20 MG/ML) 5 ML SYRINGE
INTRAMUSCULAR | Status: DC | PRN
  Administered 2021-11-23: 100 mg via INTRAVENOUS

## 2021-11-23 MED ORDER — OXYCODONE HCL 5 MG PO TABS
ORAL_TABLET | ORAL | Status: AC
  Filled 2021-11-23: qty 1

## 2021-11-23 MED ORDER — 0.9 % SODIUM CHLORIDE (POUR BTL) OPTIME
TOPICAL | Status: DC | PRN
  Administered 2021-11-23: 1000 mL

## 2021-11-23 MED ORDER — FENTANYL CITRATE PF 50 MCG/ML IJ SOSY: 25.0000 ug | PREFILLED_SYRINGE | INTRAMUSCULAR | Status: DC | PRN

## 2021-11-23 MED ORDER — SUGAMMADEX SODIUM 200 MG/2ML IV SOLN
INTRAVENOUS | Status: DC | PRN
  Administered 2021-11-23: 200 mg via INTRAVENOUS

## 2021-11-23 MED ORDER — PHENYLEPHRINE 80 MCG/ML (10ML) SYRINGE FOR IV PUSH (FOR BLOOD PRESSURE SUPPORT)
PREFILLED_SYRINGE | INTRAVENOUS | Status: DC | PRN
  Administered 2021-11-23 (×2): 80 ug via INTRAVENOUS

## 2021-11-23 MED ORDER — PHENYLEPHRINE HCL-NACL 20-0.9 MG/250ML-% IV SOLN
INTRAVENOUS | Status: DC | PRN
  Administered 2021-11-23: 30 ug/min via INTRAVENOUS

## 2021-11-23 MED ORDER — DEXAMETHASONE SODIUM PHOSPHATE 10 MG/ML IJ SOLN
INTRAMUSCULAR | Status: AC
  Filled 2021-11-23: qty 1

## 2021-11-23 MED ORDER — TRAMADOL HCL 50 MG PO TABS: 50.0000 mg | ORAL_TABLET | Freq: Four times a day (QID) | ORAL | 0 refills | Status: DC | PRN

## 2021-11-23 MED ORDER — ONDANSETRON HCL 4 MG/2ML IJ SOLN
INTRAMUSCULAR | Status: DC | PRN
  Administered 2021-11-23: 4 mg via INTRAVENOUS

## 2021-11-23 MED ORDER — FENTANYL CITRATE (PF) 100 MCG/2ML IJ SOLN
INTRAMUSCULAR | Status: DC | PRN
  Administered 2021-11-23 (×4): 50 ug via INTRAVENOUS

## 2021-11-23 MED ORDER — ORAL CARE MOUTH RINSE: 15.0000 mL | Freq: Once | OROMUCOSAL | Status: AC

## 2021-11-23 MED ORDER — LACTATED RINGERS IV SOLN: INTRAVENOUS | Status: DC

## 2021-11-23 MED ORDER — AMISULPRIDE (ANTIEMETIC) 5 MG/2ML IV SOLN: 10.0000 mg | Freq: Once | INTRAVENOUS | Status: DC | PRN

## 2021-11-23 MED ORDER — HEMOSTATIC AGENTS (NO CHARGE) OPTIME
TOPICAL | Status: DC | PRN
  Administered 2021-11-23: 1

## 2021-11-23 MED ORDER — FENTANYL CITRATE (PF) 100 MCG/2ML IJ SOLN
INTRAMUSCULAR | Status: AC
  Filled 2021-11-23: qty 2

## 2021-11-23 MED ORDER — DEXAMETHASONE SODIUM PHOSPHATE 4 MG/ML IJ SOLN
INTRAMUSCULAR | Status: DC | PRN
  Administered 2021-11-23: 5 mg via INTRAVENOUS

## 2021-11-23 MED ORDER — PROPOFOL 10 MG/ML IV BOLUS
INTRAVENOUS | Status: DC | PRN
  Administered 2021-11-23: 150 mg via INTRAVENOUS

## 2021-11-23 MED ORDER — OXYCODONE HCL 5 MG PO TABS
5.0000 mg | ORAL_TABLET | Freq: Once | ORAL | Status: AC | PRN
  Administered 2021-11-23: 5 mg via ORAL

## 2021-11-23 MED ORDER — CEFAZOLIN SODIUM-DEXTROSE 2-4 GM/100ML-% IV SOLN
2.0000 g | INTRAVENOUS | Status: AC
  Administered 2021-11-23: 2 g via INTRAVENOUS
  Filled 2021-11-23: qty 100

## 2021-11-23 MED ORDER — ACETAMINOPHEN 500 MG PO TABS
1000.0000 mg | ORAL_TABLET | Freq: Once | ORAL | Status: AC
  Administered 2021-11-23: 1000 mg via ORAL
  Filled 2021-11-23: qty 2

## 2021-11-23 MED ORDER — BUPIVACAINE HCL 0.5 % IJ SOLN
INTRAMUSCULAR | Status: DC | PRN
  Administered 2021-11-23: 10 mL

## 2021-11-23 MED ORDER — OXYCODONE HCL 5 MG/5ML PO SOLN: 5.0000 mg | Freq: Once | ORAL | Status: AC | PRN

## 2021-11-23 MED ORDER — CHLORHEXIDINE GLUCONATE 0.12 % MT SOLN
15.0000 mL | Freq: Once | OROMUCOSAL | Status: AC
Start: 2021-11-23 — End: 2021-11-23
  Administered 2021-11-23: 15 mL via OROMUCOSAL

## 2021-11-23 MED ORDER — ONDANSETRON HCL 4 MG/2ML IJ SOLN
INTRAMUSCULAR | Status: AC
  Filled 2021-11-23: qty 2

## 2021-11-23 SURGICAL SUPPLY — 34 items
ATTRACTOMAT 16X20 MAGNETIC DRP (DRAPES) ×1 IMPLANT
BAG COUNTER SPONGE SURGICOUNT (BAG) ×1 IMPLANT
BLADE SURG 15 STRL LF DISP TIS (BLADE) ×1 IMPLANT
BLADE SURG 15 STRL SS (BLADE) ×1
CHLORAPREP W/TINT 26 (MISCELLANEOUS) ×1 IMPLANT
CLIP TI MEDIUM 6 (CLIP) ×2 IMPLANT
CLIP TI WIDE RED SMALL 6 (CLIP) ×2 IMPLANT
COVER SURGICAL LIGHT HANDLE (MISCELLANEOUS) ×1 IMPLANT
DERMABOND IMPLANT
DERMABOND ADVANCED (GAUZE/BANDAGES/DRESSINGS) ×1
DERMABOND ADVANCED .7 DNX12 (GAUZE/BANDAGES/DRESSINGS) ×1 IMPLANT
DRAPE LAPAROTOMY T 98X78 PEDS (DRAPES) ×1 IMPLANT
DRAPE UTILITY XL STRL (DRAPES) ×1 IMPLANT
ELECT REM PT RETURN 15FT ADLT (MISCELLANEOUS) ×1 IMPLANT
GAUZE 4X4 16PLY ~~LOC~~+RFID DBL (SPONGE) ×1 IMPLANT
GLOVE SURG ORTHO 8.0 STRL STRW (GLOVE) ×1 IMPLANT
GOWN STRL REUS W/ TWL XL LVL3 (GOWN DISPOSABLE) ×3 IMPLANT
GOWN STRL REUS W/TWL XL LVL3 (GOWN DISPOSABLE) ×3
HEMOSTAT SURGICEL 2X4 FIBR (HEMOSTASIS) ×1 IMPLANT
ILLUMINATOR WAVEGUIDE N/F (MISCELLANEOUS) IMPLANT
KIT BASIN OR (CUSTOM PROCEDURE TRAY) ×1 IMPLANT
KIT TURNOVER KIT A (KITS) IMPLANT
NDL HYPO 25X1 1.5 SAFETY (NEEDLE) ×1 IMPLANT
NEEDLE HYPO 25X1 1.5 SAFETY (NEEDLE) ×1 IMPLANT
PACK BASIC VI WITH GOWN DISP (CUSTOM PROCEDURE TRAY) ×1 IMPLANT
PENCIL SMOKE EVACUATOR (MISCELLANEOUS) ×1 IMPLANT
SHEARS HARMONIC 9CM CVD (BLADE) IMPLANT
SUT MNCRL AB 4-0 PS2 18 (SUTURE) ×1 IMPLANT
SUT VIC AB 3-0 SH 18 (SUTURE) ×1 IMPLANT
SYR BULB IRRIG 60ML STRL (SYRINGE) ×1 IMPLANT
SYR CONTROL 10ML LL (SYRINGE) ×1 IMPLANT
TOWEL OR 17X26 10 PK STRL BLUE (TOWEL DISPOSABLE) ×1 IMPLANT
TOWEL OR NON WOVEN STRL DISP B (DISPOSABLE) ×1 IMPLANT
TUBING CONNECTING 10 (TUBING) ×1 IMPLANT

## 2021-11-23 NOTE — Transfer of Care (Signed)
Immediate Anesthesia Transfer of Care Note  Patient: Juan [name]  Procedure(s) Performed: RIGHT INFERIOR PARATHYROIDECTOMY (Right: Neck)  Patient Location: PACU  Anesthesia Type:General  Level of Consciousness: drowsy  Airway & Oxygen Therapy: Patient Spontanous Breathing and Patient connected to face mask  Post-op Assessment: Report given to RN and Post -op Vital signs reviewed and stable  Post vital signs: Reviewed and stable  Last Vitals:  Vitals Value Taken Time  BP 116/72 11/23/21 0919  Temp    Pulse 72 11/23/21 0922  Resp 23 11/23/21 0922  SpO2 98 % 11/23/21 0922  Vitals shown include unvalidated device data.  Last Pain:  Vitals:   11/23/21 0646  TempSrc:   PainSc: 0-No pain         Complications: No notable events documented.

## 2021-11-23 NOTE — Op Note (Signed)
OPERATIVE REPORT - PARATHYROIDECTOMY  Preoperative diagnosis: Primary hyperparathyroidism  Postop diagnosis: Same  Procedure: Right inferior minimally invasive parathyroidectomy  Surgeon:  Armandina Gemma, MD  Anesthesia: General endotracheal  Estimated blood loss: Minimal  Preparation: ChloraPrep  Indications: Patient is referred by Dr. [name] for surgical evaluation and recommendations regarding newly diagnosed primary hyperparathyroidism. Patient was noted on routine laboratory studies to have an elevated serum calcium level. Recent levels have ranged from 10.3-10.6. Intact PTH level was checked and measured 85. Patient underwent a nuclear medicine parathyroid scan on Aug 07, 2021. This localized a right inferior parathyroid adenoma.  USN confirmed a 1.5 cm nodule consistent with an enlarged parathyroid corresponding to the nuclear signal.  Patient now comes to surgery for parathyroidectomy.  Procedure: The patient was prepared in the pre-operative holding area. The patient was brought to the operating room and placed in a supine position on the operating room table. Following administration of general anesthesia, the patient was positioned and then prepped and draped in the usual strict aseptic fashion. After ascertaining that an adequate level of anesthesia been achieved, a neck incision was made with a #15 blade. Dissection was carried through subcutaneous tissues and platysma. Hemostasis was obtained with the electrocautery. Skin flaps were developed circumferentially and a Weitlander retractor was placed for exposure.  Strap muscles were incised in the midline. Strap muscles were reflected laterally exposing the right thyroid lobe. With gentle blunt dissection the thyroid lobe was mobilized.  Dissection was carried posteriorly and an enlarged parathyroid gland was identified. It was gently mobilized. Vascular structures were divided between ligaclips. Care was taken to avoid the  recurrent laryngeal nerve. The parathyroid gland was completely excised. It was submitted to pathology where frozen section confirmed hypercellular parathyroid tissue consistent with adenoma.  Neck was irrigated with warm saline and good hemostasis was noted. Fibrillar was placed in the operative field. Strap muscles were approximated in the midline with interrupted 3-0 Vicryl sutures. Platysma was closed with interrupted 3-0 Vicryl sutures. Marcaine was infiltrated circumferentially. Skin was closed with a running 4-0 Monocryl subcuticular suture. Wound was washed and dried and Dermabond was applied. Patient was awakened from anesthesia and brought to the recovery room. The patient tolerated the procedure well.   Armandina Gemma, [facility] Office: [phone]

## 2021-11-23 NOTE — Anesthesia Postprocedure Evaluation (Signed)
Anesthesia Post Note  Patient: Juan [name]  Procedure(s) Performed: RIGHT INFERIOR PARATHYROIDECTOMY (Right: Neck)     Patient location during evaluation: PACU Anesthesia Type: General Level of consciousness: awake and alert Pain management: pain level controlled Vital Signs Assessment: post-procedure vital signs reviewed and stable Respiratory status: spontaneous breathing, nonlabored ventilation and respiratory function stable Cardiovascular status: blood pressure returned to baseline Postop Assessment: no apparent nausea or vomiting Anesthetic complications: no   No notable events documented.  Last Vitals:  Vitals:   11/23/21 0945 11/23/21 1010  BP: 109/69 110/78  Pulse: 73 74  Resp: 15   Temp:    SpO2: 96% 96%    Last Pain:  Vitals:   11/23/21 0920  TempSrc:   PainSc: Asleep                 Marthenia Rolling

## 2021-11-23 NOTE — Interval H&P Note (Signed)
History and Physical Interval Note:  11/23/2021 7:32 AM  Juan [name]  has presented today for surgery, with the diagnosis of PRIMARY HYPERPARATHYROIDISM.  The various methods of treatment have been discussed with the patient and family. After consideration of risks, benefits and other options for treatment, the patient has consented to    Procedure(s): RIGHT INFERIOR PARATHYROIDECTOMY (Right) as a surgical intervention.    The patient's history has been reviewed, patient examined, no change in status, stable for surgery.  I have reviewed the patient's chart and labs.  Questions were answered to the patient's satisfaction.    Armandina Gemma, [facility] [place] practice Office: [place]

## 2021-11-23 NOTE — Anesthesia Procedure Notes (Signed)
Procedure Name: Intubation Date/Time: 11/23/2021 8:13 AM  Performed by: Claudia Desanctis, CRNAPre-anesthesia Checklist: Patient identified, Emergency Drugs available, Suction available and Patient being monitored Patient Re-evaluated:Patient Re-evaluated prior to induction Oxygen Delivery Method: Circle system utilized Preoxygenation: Pre-oxygenation with 100% oxygen Induction Type: IV induction Ventilation: Mask ventilation without difficulty Laryngoscope Size: 2 and Miller Grade View: Grade II Tube type: Oral Tube size: 7.5 mm Number of attempts: 1 Airway Equipment and Method: Stylet Placement Confirmation: ETT inserted through vocal cords under direct vision, positive ETCO2 and breath sounds checked- equal and bilateral Secured at: 22 cm Tube secured with: Tape Dental Injury: Teeth and Oropharynx as per pre-operative assessment

## 2021-11-23 NOTE — Discharge Instructions (Addendum)
[facility] - Dr. Todd Gerkin  THYROID & PARATHYROID SURGERY:  POST-OP INSTRUCTIONS  Always review the instruction sheet provided by the hospital nurse at discharge.  A prescription for pain medication may be sent to your pharmacy at the time of discharge.  Take your pain medication as prescribed.  If narcotic pain medicine is not needed, then you may take acetaminophen (Tylenol) or ibuprofen (Advil) as needed for pain or soreness.  Take your normal home medications as prescribed unless otherwise directed.  If you need a refill on your pain medication, please contact the office during regular business hours.  Prescriptions will not be processed by the office after 5:00PM or on weekends.  Start with a light diet upon arrival home, such as soup and crackers or toast.  Be sure to drink plenty of fluids.  Resume your normal diet the day after surgery.  Most patients will experience some swelling and bruising on the chest and neck area.  Ice packs will help for the first 48 hours after arriving home.  Swelling and bruising will take several days to resolve.   It is common to experience some constipation after surgery.  Increasing fluid intake and taking a stool softener (Colace) will usually help to prevent this problem.  A mild laxative (Milk of Magnesia or Miralax) should be taken according to package directions if there has been no bowel movement after 48 hours.  Dermabond glue covers your incision. This seals the wound and you may shower at any time. The Dermabond will remain in place for about a week.  You may gradually remove the glue when it loosens around the edges.  If you need to loosen the Dermabond for removal, apply a layer of Vaseline to the wound for 15 minutes and then remove with a Kleenex. Your sutures are under the skin and will not show - they will dissolve on their own.  You may resume light daily activities beginning the day after discharge (such as self-care,  walking, climbing stairs), gradually increasing activities as tolerated. You may have sexual intercourse when it is comfortable. Refrain from any heavy lifting or straining until approved by your doctor. You may drive when you no longer are taking prescription pain medication, you can comfortably wear a seatbelt, and you can safely maneuver your car and apply the brakes.  You will see your doctor in the office for a follow-up appointment approximately three weeks after your surgery.  Make sure that you call for this appointment within a day or two after you arrive home to insure a convenient appointment time. Please have any requested laboratory tests performed a few days prior to your office visit so that the results will be available at your follow up appointment.  WHEN TO CALL THE [facility] OFFICE: -- Fever greater than 101.5 -- Inability to urinate -- Nausea and/or vomiting - persistent -- Extreme swelling or bruising -- Continued bleeding from incision -- Increased pain, redness, or drainage from the incision -- Difficulty swallowing or breathing -- Muscle cramping or spasms -- Numbness or tingling in hands or around lips  The clinic staff is available to answer your questions during regular business hours.  Please don't hesitate to call and ask to speak to one of the nurses if you have concerns.  [facility] OFFICE: [phone] (24 hours)  Please sign up for MyChart accounts. This will allow you to communicate directly with my nurse or myself without having to call the office. It will also allow you   to view your test results. You will need to enroll in MyChart for my office ([facility]) and for the hospital ([facility]).  Todd Gerkin, MD [facility] A [facility] practice 

## 2021-11-24 ENCOUNTER — Encounter (HOSPITAL_COMMUNITY): Payer: Self-pay | Admitting: Surgery

## 2021-11-24 LAB — SURGICAL PATHOLOGY

## 2021-11-24 NOTE — Progress Notes (Signed)
Path is benign, consistent with parathyroid adenoma, as expected.  Will check calcium and PTH prior to OV at [facility] per usual.  tmg  Armandina Gemma, [facility] [place] practice Office: [phone]

## 2021-11-25 ENCOUNTER — Other Ambulatory Visit (HOSPITAL_COMMUNITY): Payer: Self-pay | Admitting: Family Medicine

## 2021-11-29 NOTE — Research (Signed)
[place] Screening run in 19-Dec-2020

## 2021-11-29 NOTE — Research (Signed)
[place] in 19-Sept-2022

## 2022-02-01 ENCOUNTER — Other Ambulatory Visit: Payer: Self-pay | Admitting: Internal Medicine

## 2022-02-14 ENCOUNTER — Other Ambulatory Visit: Payer: Self-pay | Admitting: Cardiovascular Disease

## 2022-03-08 ENCOUNTER — Other Ambulatory Visit: Payer: Self-pay | Admitting: Cardiovascular Disease

## 2022-03-28 NOTE — Telephone Encounter (Signed)
Encounter opened in error

## 2022-04-03 ENCOUNTER — Other Ambulatory Visit: Payer: Self-pay | Admitting: Cardiovascular Disease

## 2022-04-21 ENCOUNTER — Other Ambulatory Visit (HOSPITAL_COMMUNITY): Payer: Self-pay | Admitting: Family Medicine

## 2022-05-25 ENCOUNTER — Emergency Department (HOSPITAL_COMMUNITY): Payer: Commercial Managed Care - PPO

## 2022-05-25 ENCOUNTER — Other Ambulatory Visit: Payer: Self-pay

## 2022-05-25 ENCOUNTER — Emergency Department (HOSPITAL_COMMUNITY)
Admission: EM | Admit: 2022-05-25 | Discharge: 2022-05-25 | Disposition: A | Discharge status: Home / Self Care | Payer: Commercial Managed Care - PPO | Attending: Emergency Medicine | Admitting: Emergency Medicine

## 2022-05-25 DIAGNOSIS — Z794 Long term (current) use of insulin: Secondary | ICD-10-CM | POA: Insufficient documentation

## 2022-05-25 DIAGNOSIS — N1831 Chronic kidney disease, stage 3a: Secondary | ICD-10-CM | POA: Insufficient documentation

## 2022-05-25 DIAGNOSIS — M79605 Pain in left leg: Secondary | ICD-10-CM | POA: Diagnosis present

## 2022-05-25 DIAGNOSIS — N179 Acute kidney failure, unspecified: Secondary | ICD-10-CM | POA: Insufficient documentation

## 2022-05-25 DIAGNOSIS — Z951 Presence of aortocoronary bypass graft: Secondary | ICD-10-CM | POA: Insufficient documentation

## 2022-05-25 DIAGNOSIS — D696 Thrombocytopenia, unspecified: Secondary | ICD-10-CM | POA: Diagnosis not present

## 2022-05-25 DIAGNOSIS — E860 Dehydration: Secondary | ICD-10-CM | POA: Diagnosis not present

## 2022-05-25 DIAGNOSIS — I2510 Atherosclerotic heart disease of native coronary artery without angina pectoris: Secondary | ICD-10-CM | POA: Diagnosis not present

## 2022-05-25 DIAGNOSIS — R252 Cramp and spasm: Secondary | ICD-10-CM | POA: Insufficient documentation

## 2022-05-25 DIAGNOSIS — Z7982 Long term (current) use of aspirin: Secondary | ICD-10-CM | POA: Insufficient documentation

## 2022-05-25 LAB — BASIC METABOLIC PANEL
Anion gap: 13 (ref 5–15)
BUN: 57 mg/dL — ABNORMAL HIGH (ref 6–20)
CO2: 23 mmol/L (ref 22–32)
Calcium: 9.5 mg/dL (ref 8.9–10.3)
Chloride: 96 mmol/L — ABNORMAL LOW (ref 98–111)
Creatinine, Ser: 1.82 mg/dL — ABNORMAL HIGH (ref 0.61–1.24)
GFR, Estimated: 43 mL/min — ABNORMAL LOW (ref >60)
Glucose, Bld: 201 mg/dL — ABNORMAL HIGH (ref 70–99)
Potassium: 4.4 mmol/L (ref 3.5–5.1)
Sodium: 132 mmol/L — ABNORMAL LOW (ref 135–145)

## 2022-05-25 LAB — CBC
HCT: 39 % (ref 39.0–52.0)
Hemoglobin: 13.5 g/dL (ref 13.0–17.0)
MCH: 27 pg (ref 26.0–34.0)
MCHC: 34.6 g/dL (ref 30.0–36.0)
MCV: 78 fL — ABNORMAL LOW (ref 80.0–100.0)
Platelets: 128 10*3/uL — ABNORMAL LOW (ref 150–400)
RBC: 5 MIL/uL (ref 4.22–5.81)
RDW: 16.5 % — ABNORMAL HIGH (ref 11.5–15.5)
WBC: 7.1 10*3/uL (ref 4.0–10.5)
nRBC: 0 % (ref 0.0–0.2)

## 2022-05-25 LAB — BRAIN NATRIURETIC PEPTIDE: B Natriuretic Peptide: 259.8 pg/mL — ABNORMAL HIGH (ref 0.0–100.0)

## 2022-05-25 LAB — MAGNESIUM: Magnesium: 2.4 mg/dL (ref 1.7–2.4)

## 2022-05-25 MED ORDER — LACTATED RINGERS IV BOLUS
1000.0000 mL | Freq: Once | INTRAVENOUS | Status: AC
  Administered 2022-05-25: 1000 mL via INTRAVENOUS

## 2022-05-25 MED ORDER — METHOCARBAMOL 750 MG PO TABS: 750.0000 mg | ORAL_TABLET | Freq: Three times a day (TID) | ORAL | 0 refills | Status: DC | PRN

## 2022-05-25 MED ORDER — METHOCARBAMOL 500 MG PO TABS
750.0000 mg | ORAL_TABLET | Freq: Once | ORAL | Status: AC
  Administered 2022-05-25: 750 mg via ORAL
  Filled 2022-05-25: qty 2

## 2022-05-25 NOTE — ED Triage Notes (Signed)
Pt reports BLE swelling since 0400 today. Has been relieved by getting in the shower or using a massager. Had one episode where the pain came up into his abdomen. Pt also reports being on Torsemide but having not urinated since last night. Endorses episode of sob.

## 2022-05-25 NOTE — Discharge Instructions (Addendum)
It was our pleasure to provide your ER care today - we hope that you feel better.  Drink adequate fluids/stay well hydrated - see attached info - hopefully staying well hydrated will help your recent symptoms. You may also take robaxin as need for muscle spasm - no driving when taking.   Follow up closely with your doctor in the next 2-3 days - have labs/kidney function rechecked, and discuss possible adjustment to medications.  Hold your next demedex dose.  Avoid taking any nsaid type meds such as ibuprofen/motrin or naprosyn/aleve.   Return to ER if worse, new symptoms, fevers, chest pain, trouble breathing, severe/persistent leg pain, or other emergency concern.

## 2022-05-25 NOTE — ED Provider Notes (Signed)
[place] Provider Note   CSN: [id number] Arrival date & time: 05/25/22  [id number]     History  Chief Complaint  Patient presents with   Leg Pain    Juan [name] is a 57 y.o. male.  Patient w hx cad/cabg, pvd, c/o intermittent cramping to legs in the past day. Indicates has had similar cramping in past, and occasionally has issues with restless legs, but that cramping was particularly bothersome today - indicates was in area from left hip to knee and a little bit in right thigh. No leg swelling. No skin changes, lesions or redness. No constant pain. No new or worsening claudication. No back/radicular pain. No recent change in meds. Pt indicates was concerned b/c he had noted decreased amount of urination today - he did void once, but indicates less than normal. No chest pain, discomfort, sob or unusual doe.   The history is provided by the patient, medical records and a significant other.  Leg Pain Associated symptoms: no back pain, no fever and no neck pain        Home Medications Prior to Admission medications   Medication Sig Start Date End Date Taking? Authorizing Provider  methocarbamol (ROBAXIN) 750 MG tablet Take 1 tablet (750 mg total) by mouth 3 (three) times daily as needed (muscle spasm/pain). 05/25/22  Yes Lajean Saver, MD  aspirin 81 MG chewable tablet Chew 1 tablet (81 mg total) by mouth daily. 12/19/16   Cheryln Manly, NP  clopidogrel (PLAVIX) 75 MG tablet Take 1 tablet (75 mg total) by mouth daily. 04/04/22   Lorretta Harp, MD  Continuous Blood Gluc Receiver ([place]) [place] See admin instructions. 12/09/19   [provider]  ENTRESTO 49-51 MG TAKE 1 TABLET BY MOUTH TWICE A DAY (PRIOR AUTH REQUIRED FOR COPAY CARD) 11/27/21   Bensimhon, [name], MD  fenofibrate 160 MG tablet Take 160 mg by mouth in the morning.    [provider]  Glucose Blood (BAYER CONTOUR NEXT TEST VI) glucose testing     [provider]  insulin regular human CONCENTRATED (HUMULIN R) 500 UNIT/ML injection Inject into the skin continuous. Pump setting 125 delivers up to 630 units (max daily dosage via pump)    [provider]  isosorbide mononitrate (IMDUR) 30 MG 24 hr tablet TAKE 1 TABLET BY MOUTH EVERY DAY 02/01/22   Hilty, Nadean Corwin, MD  metFORMIN (GLUCOPHAGE) 1000 MG tablet Take 1,000 mg by mouth 2 (two) times daily with a meal.    [provider]  metoprolol succinate (TOPROL-XL) 25 MG 24 hr tablet TAKE 3 TABLETS BY MOUTH TWICE A DAY WITH OR IMMEDIATELY FOLLOWING A MEAL 02/15/22   Lorretta Harp, MD  nitroGLYCERIN (NITROSTAT) 0.4 MG SL tablet Place 1 tablet (0.4 mg total) under the tongue every 5 (five) minutes as needed. 06/18/19   Lorretta Harp, MD  Propylene Glycol (SYSTANE BALANCE) 0.6 % SOLN Place 1 drop into both eyes 4 (four) times daily as needed (dry eyes).    [provider]  spironolactone (ALDACTONE) 25 MG tablet TAKE 1 TABLET (25 MG TOTAL) BY MOUTH DAILY. 07/19/21   Bensimhon, [name], MD  torsemide (DEMADEX) 20 MG tablet TAKE 2 TABLETS BY MOUTH 2 TIMES DAILY. 04/23/22   [place], Maricela Bo, FNP  traMADol (ULTRAM) 50 MG tablet Take 1-2 tablets (50-100 mg total) by mouth every 6 (six) hours as needed for moderate pain. 11/23/21   Armandina Gemma,  MD  TRESIBA FLEXTOUCH 200 UNIT/ML FlexTouch Pen Inject 36 Units into the skin in the morning. 09/29/20   [provider]  VASCEPA 1 g capsule TAKE 2 CAPSULES BY MOUTH TWICE A DAY 06/08/21   Bensimhon, [name], MD      Allergies    Empagliflozin, Reglan [metoclopramide], and Sglt2 inhibitors    Review of Systems   Review of Systems  Constitutional:  Negative for chills and fever.  HENT:  Negative for sore throat.   Eyes:  Negative for redness.  Respiratory:  Negative for shortness of breath.   Cardiovascular:  Negative for chest pain.  Gastrointestinal:  Negative for abdominal pain, nausea and vomiting.   Genitourinary:  Positive for decreased urine volume. Negative for dysuria and flank pain.  Musculoskeletal:  Negative for back pain and neck pain.  Skin:  Negative for rash.  Neurological:  Negative for weakness, numbness and headaches.  Hematological:  Does not bruise/bleed easily.  Psychiatric/Behavioral:  Negative for confusion.     Physical Exam Updated Vital Signs BP 112/69   Pulse 68   Temp 97.6 F (36.4 C) (Oral)   Resp 19   Ht 1.778 m ('5\' 10"'$ )   Wt 108 kg   SpO2 98%   BMI 34.15 kg/m  Physical Exam Vitals and nursing note reviewed.  Constitutional:      Appearance: Normal appearance. He is well-developed.  HENT:     Head: Atraumatic.     Nose: Nose normal.     Mouth/Throat:     Mouth: Mucous membranes are moist.  Eyes:     General: No scleral icterus.    Conjunctiva/sclera: Conjunctivae normal.  Neck:     Trachea: No tracheal deviation.  Cardiovascular:     Rate and Rhythm: Normal rate and regular rhythm.     Pulses: Normal pulses.     Heart sounds: Normal heart sounds. No murmur heard.    No friction rub. No gallop.  Pulmonary:     Effort: Pulmonary effort is normal. No accessory muscle usage or respiratory distress.     Breath sounds: Normal breath sounds.  Abdominal:     General: There is no distension.     Palpations: Abdomen is soft. There is no mass.     Tenderness: There is no abdominal tenderness.  Musculoskeletal:        General: No swelling.     Cervical back: Neck supple.     Comments: Good passive rom bil hips and knees without pain. Bilateral legs of normal color and warmth. Fem and distal pulses palp (and confirmed w strong doppler signal). No cellulitis. No sts. No skin lesions. No focal bony tenderness.  Mod symmetric swelling of bilateral feet/ankles/lower legs (pt indicates baseline).   Skin:    General: Skin is warm and dry.     Findings: No rash.  Neurological:     Mental Status: He is alert.     Comments: Alert, speech clear.  Motor/sens grossly intact bil lower ext.   Psychiatric:        Mood and Affect: Mood normal.     ED Results / Procedures / Treatments   Labs (all labs ordered are listed, but only abnormal results are displayed) Results for orders placed or performed during the hospital encounter of 05/25/22  CBC  Result Value Ref Range   WBC 7.1 4.0 - 10.5 K/uL   RBC 5.00 4.22 - 5.81 MIL/uL   Hemoglobin 13.5 13.0 - 17.0 g/dL   HCT 39.0  39.0 - 52.0 %   MCV 78.0 (L) 80.0 - 100.0 fL   MCH 27.0 26.0 - 34.0 pg   MCHC 34.6 30.0 - 36.0 g/dL   RDW 16.5 (H) 11.5 - 15.5 %   Platelets 128 (L) 150 - 400 K/uL   nRBC 0.0 0.0 - 0.2 %  Basic metabolic panel  Result Value Ref Range   Sodium 132 (L) 135 - 145 mmol/L   Potassium 4.4 3.5 - 5.1 mmol/L   Chloride 96 (L) 98 - 111 mmol/L   CO2 23 22 - 32 mmol/L   Glucose, Bld 201 (H) 70 - 99 mg/dL   BUN 57 (H) 6 - 20 mg/dL   Creatinine, Ser 1.82 (H) 0.61 - 1.24 mg/dL   Calcium 9.5 8.9 - 10.3 mg/dL   GFR, Estimated 43 (L) >60 mL/min   Anion gap 13 5 - 15  Brain natriuretic peptide  Result Value Ref Range   B Natriuretic Peptide 259.8 (H) 0.0 - 100.0 pg/mL  Magnesium  Result Value Ref Range   Magnesium 2.4 1.7 - 2.4 mg/dL   DG Chest 2 View  Result Date: 05/25/2022 CLINICAL DATA:  Shortness of breath EXAM: CHEST - 2 VIEW COMPARISON:  CXR 05/21/19 FINDINGS: Status post median sternotomy and CABG. No pleural effusion. No pneumothorax. No focal airspace opacity. No radiographically apparent displaced rib fractures. Visualized upper abdomen is unremarkable. Vertebral body heights are maintained. Partially visualized cervical spinal fusion hardware in place IMPRESSION: No active cardiopulmonary disease. Electronically Signed   By: Marin Roberts M.D.   On: 05/25/2022 10:31    EKG EKG Interpretation  Date/Time:  Friday May 25 2022 09:46:27 EST Ventricular Rate:  72 PR Interval:  214 QRS Duration: 144 QT Interval:  460 QTC Calculation: 503 R Axis:   -84 Text  Interpretation: Sinus rhythm with 1st degree A-V block Right bundle branch block Left anterior fascicular block  Bifascicular block  Non-specific ST-t changes Confirmed by Lajean Saver [phone]) on 05/25/2022 9:50:06 AM  Radiology DG Chest 2 View  Result Date: 05/25/2022 CLINICAL DATA:  Shortness of breath EXAM: CHEST - 2 VIEW COMPARISON:  CXR 05/21/19 FINDINGS: Status post median sternotomy and CABG. No pleural effusion. No pneumothorax. No focal airspace opacity. No radiographically apparent displaced rib fractures. Visualized upper abdomen is unremarkable. Vertebral body heights are maintained. Partially visualized cervical spinal fusion hardware in place IMPRESSION: No active cardiopulmonary disease. Electronically Signed   By: Marin Roberts M.D.   On: 05/25/2022 10:31    Procedures Procedures    Medications Ordered in ED Medications  lactated ringers bolus 1,000 mL (1,000 mLs Intravenous New Bag/Given 05/25/22 1153)  methocarbamol (ROBAXIN) tablet 750 mg (750 mg Oral Given 05/25/22 1155)    ED Course/ Medical Decision Making/ A&P                             Medical Decision Making Problems Addressed: AKI (acute kidney injury) [facility]): acute illness or injury with systemic symptoms that poses a threat to life or bodily functions Dehydration: acute illness or injury with systemic symptoms that poses a threat to life or bodily functions Leg cramps: acute illness or injury with systemic symptoms Stage 3a chronic kidney disease ([place]): chronic illness or injury with exacerbation, progression, or side effects of treatment that poses a threat to life or bodily functions Thrombocytopenia ([place]): chronic illness or injury  Amount and/or Complexity of Data Reviewed Independent Historian:     Details:  Family, hx External Data Reviewed: labs and notes. Labs: ordered. Decision-making details documented in ED Course. Radiology: ordered and independent interpretation performed. Decision-making details  documented in ED Course. ECG/medicine tests: ordered and independent interpretation performed. Decision-making details documented in ED Course.  Risk Prescription drug management. Decision regarding hospitalization.   Iv ns. Continuous pulse ox and cardiac monitoring. Labs ordered/sent. Imaging ordered.   Differential diagnosis includes AKI, electrolyte abnormality, etc. Dispo decision including potential need for admission considered - will get labs and imaging and reassess.   Reviewed nursing notes and prior charts for additional history. External reports reviewed. Additional history from: family.   Cardiac monitor: sinus rhythm, rate 72.  Labs reviewed/interpreted by me - bun/cr increased compared to prior. K normal, mg normal.   LR bolus. Robaxin po. Po fluids/food.   Xrays reviewed/interpreted by me - no edema.   Pt currently appears stable for d/c.   Rec close pcp f/u.  Return precautions provided.          Final Clinical Impression(s) / ED Diagnoses Final diagnoses:  Leg cramps  AKI (acute kidney injury) ([place])  Dehydration  Stage 3a chronic kidney disease ([place])  Thrombocytopenia ([place])    Rx / DC Orders ED Discharge Orders          Ordered    methocarbamol (ROBAXIN) 750 MG tablet  3 times daily PRN        05/25/22 1434              Lajean Saver, MD 05/25/22 1437

## 2022-06-11 ENCOUNTER — Ambulatory Visit: Payer: Commercial Managed Care - PPO | Admitting: Physician Assistant

## 2022-06-11 ENCOUNTER — Ambulatory Visit (INDEPENDENT_AMBULATORY_CARE_PROVIDER_SITE_OTHER)
Admission: RE | Admit: 2022-06-11 | Discharge: 2022-06-11 | Disposition: A | Discharge status: Home / Self Care | Payer: Commercial Managed Care - PPO | Source: Ambulatory Visit | Attending: Surgery | Admitting: Surgery

## 2022-06-11 ENCOUNTER — Encounter: Payer: Self-pay | Admitting: Physician Assistant

## 2022-06-11 ENCOUNTER — Ambulatory Visit (HOSPITAL_COMMUNITY)
Admission: RE | Admit: 2022-06-11 | Discharge: 2022-06-11 | Disposition: A | Discharge status: Home / Self Care | Payer: Commercial Managed Care - PPO | Source: Ambulatory Visit | Attending: Surgery | Admitting: Surgery

## 2022-06-11 VITALS — BP 118/68 | HR 78 | Temp 98.0°F | Resp 18 | Ht 70.0 in | Wt 235.0 lb

## 2022-06-11 DIAGNOSIS — I739 Peripheral vascular disease, unspecified: Secondary | ICD-10-CM

## 2022-06-11 DIAGNOSIS — I70212 Atherosclerosis of native arteries of extremities with intermittent claudication, left leg: Secondary | ICD-10-CM | POA: Insufficient documentation

## 2022-06-11 DIAGNOSIS — I8393 Asymptomatic varicose veins of bilateral lower extremities: Secondary | ICD-10-CM

## 2022-06-11 DIAGNOSIS — M7989 Other specified soft tissue disorders: Secondary | ICD-10-CM

## 2022-06-11 LAB — VAS US ABI WITH/WO TBI
Left ABI: 0.88
Right ABI: 0.96

## 2022-06-11 NOTE — Progress Notes (Signed)
HISTORY AND PHYSICAL     CC:  follow up. Requesting Provider:  Kathalene Frames, *  HPI: This is a 57 y.o. male who is here today for follow up for PAD.  Pt has hx of left distal superficial femoral artery to tibioperoneal trunk bypass graft with ipsilateral nonreversed saphenous vein on 07/28/21 by Dr. Trula Slade for severe left leg claudication.  Pt was last seen 10/02/2021 and at that time, he was having dehiscence of his incision.  He has hx of Non Hodgkins lymphoma with radiation to the left knee in 1990 and it was suspected that he had poor healing due to this.   The pt returns today for follow up and here with his wife.  He denies any claudication or rest pain or non healing wounds.  His biggest complaint today is his left leg swelling.  He states that his leg leg swells more than the right.  It didn't swell this bad directly after surgery but has more swelling now.  He states that some times it is very difficult to get his shoe on.  He states he has worn compression but it really didn't help the swelling on his foot.  He has hx of CHF and elevated creatinine.  He is not on a statin due to hx of elevated liver enzymes.  Looking back through his chart, he has had multiple studies evaluating for DVT but not a formal venous reflux study.  He has never had a DVT.    He works as at [employer] and his job consists of sitting or he can be very mobile depending on the day.   Since he was here last, he was diagnosed with hyperparathyroidism and had his parathyroid removed.   The pt is not on a statin for cholesterol management.   He is on vascepa.   The pt is on an aspirin.    Other AC:  Plavix The pt is on BB, diuretic for hypertension.  The pt does  have diabetes. Tobacco hx:  never  Pt does not have family hx of AAA.  Past Medical History:  Diagnosis Date   Abnormal cardiovascular stress test 02/06/2013   Anemia    occasional - no problems currently(10/02/2011)   Bilateral renal cysts  06/16/2011   states no known problems   Blood transfusion without reported diagnosis    CAD (coronary artery disease), LAD 90%, 1st diag 95%, LCX 70% wth AV groove 90%, RCA 40-50% mid and 80% long distal stenosis 02/03/13 02/04/2013   Chest pain    positive Myoview stress test   CHF (congestive heart failure) (HCC)    Coronary artery calcification seen on CAT scan    Diabetes mellitus    IDDM   Fatty liver disease, nonalcoholic [id number]   Hyperlipidemia    Hypertension    states no dx. of HTN, takes med. to protect kidneys due to DM   Lateral meniscus tear 09/2011   left   Loose body in knee 09/2011   loose bodies left knee   Non Hodgkin's lymphoma ([place]) 1991   Pancreatitis    occasional - last episode 06/2011   Pneumonia    S/P CABG x 4, 02/05/13 LIMA-LAD; LT. RADIAL-OM;VG-DIAG; VG-PDA 02/06/2013   10/18 3/4 patent grafts (occluded SVG-->Diag), PCI/DESx 3 SVG-->RCA, normal EF   Sleep apnea    mild per patient   Splenomegaly, congestive, chronic    Stuffy and runny nose 10/02/2011   yellow drainage from nose    Past  Surgical History:  Procedure Laterality Date   ABDOMINAL AORTIC ANEURYSM REPAIR     ABDOMINAL AORTOGRAM W/LOWER EXTREMITY N/A 07/18/2021   Procedure: ABDOMINAL AORTOGRAM W/LOWER EXTREMITY;  Surgeon: Serafina Mitchell, MD;  Location: [facility];  Service: Cardiovascular;  Laterality: N/A;   ANTERIOR CERVICAL DECOMP/DISCECTOMY FUSION N/A 03/26/2018   Procedure: ANTERIOR CERVICAL DECOMPRESSION FUSION CERVICAL 5-6 WITH INSTRUMENTATION AND ALLOGRAFT;  Surgeon: Phylliss Bob, MD;  Location: [place];  Service: Orthopedics;  Laterality: N/A;   CARDIAC CATHETERIZATION     CORONARY ARTERY BYPASS GRAFT N/A 02/05/2013   Procedure: CORONARY ARTERY BYPASS GRAFTING (CABG);  Surgeon: Ivin Poot, MD;  Location: [facility];  Service: Open Heart Surgery;  Laterality: N/A;  Coronary artery bypass graft on pump times four using left internal mammary artery and right greater  saphenous vein via endovein harvest and left radial artery harvest.    CORONARY STENT INTERVENTION  12/17/2016    PCI and drug-eluting stenting of the mid and distal RCA SVG    CORONARY STENT INTERVENTION N/A 12/17/2016   Procedure: CORONARY STENT INTERVENTION;  Surgeon: Lorretta Harp, MD;  Location: [facility];  Service: Cardiovascular;  Laterality: N/A;   ELBOW SURGERY     FEMORAL-POPLITEAL BYPASS GRAFT Left 07/28/2021   Procedure: LEFT FEMORAL-BELOW KNEE POPLITEAL ARTERY BYPASS GRAFT;  Surgeon: Serafina Mitchell, MD;  Location: [facility];  Service: Vascular;  Laterality: Left;   HERNIA REPAIR     inguinal    herniated disc     INTRAOPERATIVE TRANSESOPHAGEAL ECHOCARDIOGRAM N/A 02/05/2013   Procedure: INTRAOPERATIVE TRANSESOPHAGEAL ECHOCARDIOGRAM;  Surgeon: Ivin Poot, MD;  Location: [place];  Service: Open Heart Surgery;  Laterality: N/A;   KNEE ARTHROSCOPY  10/09/2011   Procedure: ARTHROSCOPY KNEE;  Surgeon: Nita Sells, MD;  Location: [facility];  Service: Orthopedics;  Laterality: Left;   LEFT HEART CATH AND CORS/GRAFTS ANGIOGRAPHY N/A 12/17/2016   Procedure: LEFT HEART CATH AND CORS/GRAFTS ANGIOGRAPHY;  Surgeon: Lorretta Harp, MD;  Location: [facility];  Service: Cardiovascular;  Laterality: N/A;   LEFT HEART CATHETERIZATION WITH CORONARY ANGIOGRAM N/A 02/03/2013   Procedure: LEFT HEART CATHETERIZATION WITH CORONARY ANGIOGRAM;  Surgeon: Lorretta Harp, MD;  Location: [facility];  Service: Cardiovascular;  Laterality: N/A;   NASAL SEPTUM SURGERY     PARATHYROIDECTOMY Right 11/23/2021   Procedure: RIGHT INFERIOR PARATHYROIDECTOMY;  Surgeon: Armandina Gemma, MD;  Location: [facility];  Service: General;  Laterality: Right;   RADIAL ARTERY HARVEST Left 02/05/2013   Procedure: RADIAL ARTERY HARVEST;  Surgeon: Ivin Poot, MD;  Location: [facility];  Service: Vascular;  Laterality: Left;   RIGHT/LEFT HEART CATH AND CORONARY/GRAFT ANGIOGRAPHY N/A  02/15/2020   Procedure: RIGHT/LEFT HEART CATH AND CORONARY/GRAFT ANGIOGRAPHY;  Surgeon: Lorretta Harp, MD;  Location: [facility];  Service: Cardiovascular;  Laterality: N/A;   TEE WITHOUT CARDIOVERSION N/A 02/18/2020   Procedure: TRANSESOPHAGEAL ECHOCARDIOGRAM (TEE);  Surgeon: Jolaine Artist, MD;  Location: [facility];  Service: Cardiovascular;  Laterality: N/A;   TIBIA BONE BIOPSY  x 3   left   TONSILLECTOMY     VEIN HARVEST Left 07/28/2021   Procedure: HARVEST OF GREAT SAPHENOUS VEIN;  Surgeon: Serafina Mitchell, MD;  Location: [facility];  Service: Vascular;  Laterality: Left;    Allergies  Allergen Reactions   Empagliflozin Other (See Comments)    Caused skin infection   Reglan [Metoclopramide] Other (See Comments)    Hyperactivity with higher  doses (can tolerate lower doses)    Sglt2 Inhibitors Other (See Comments)    Cellulitis with Jardiance and Farxiga    Current Outpatient Medications  Medication Sig Dispense Refill   aspirin 81 MG chewable tablet Chew 1 tablet (81 mg total) by mouth daily.     clopidogrel (PLAVIX) 75 MG tablet Take 1 tablet (75 mg total) by mouth daily. 90 tablet 3   Continuous Blood Gluc Receiver (DEXCOM G6 RECEIVER) DEVI See admin instructions.     ENTRESTO 49-51 MG TAKE 1 TABLET BY MOUTH TWICE A DAY (PRIOR AUTH REQUIRED FOR COPAY CARD) 60 tablet 11   fenofibrate 160 MG tablet Take 160 mg by mouth in the morning.     Glucose Blood (BAYER CONTOUR NEXT TEST VI) glucose testing     insulin regular human CONCENTRATED (HUMULIN R) 500 UNIT/ML injection Inject into the skin continuous. Pump setting 125 delivers up to 630 units (max daily dosage via pump)     isosorbide mononitrate (IMDUR) 30 MG 24 hr tablet TAKE 1 TABLET BY MOUTH EVERY DAY 90 tablet 3   metFORMIN (GLUCOPHAGE) 1000 MG tablet Take 1,000 mg by mouth 2 (two) times daily with a meal.     methocarbamol (ROBAXIN) 750 MG tablet Take 1 tablet (750 mg total) by mouth 3 (three) times daily as  needed (muscle spasm/pain). 15 tablet 0   metoprolol succinate (TOPROL-XL) 25 MG 24 hr tablet TAKE 3 TABLETS BY MOUTH TWICE A DAY WITH OR IMMEDIATELY FOLLOWING A MEAL 540 tablet 3   nitroGLYCERIN (NITROSTAT) 0.4 MG SL tablet Place 1 tablet (0.4 mg total) under the tongue every 5 (five) minutes as needed. 25 tablet 2   Propylene Glycol (SYSTANE BALANCE) 0.6 % SOLN Place 1 drop into both eyes 4 (four) times daily as needed (dry eyes).     spironolactone (ALDACTONE) 25 MG tablet TAKE 1 TABLET (25 MG TOTAL) BY MOUTH DAILY. 90 tablet 3   torsemide (DEMADEX) 20 MG tablet TAKE 2 TABLETS BY MOUTH 2 TIMES DAILY. 120 tablet 0   traMADol (ULTRAM) 50 MG tablet Take 1-2 tablets (50-100 mg total) by mouth every 6 (six) hours as needed for moderate pain. 15 tablet 0   TRESIBA FLEXTOUCH 200 UNIT/ML FlexTouch Pen Inject 36 Units into the skin in the morning.     VASCEPA 1 g capsule TAKE 2 CAPSULES BY MOUTH TWICE A DAY 360 capsule 4   No current facility-administered medications for this visit.    Family History  Adopted: Yes    Social History   Socioeconomic History   Marital status: Married    Spouse name: Water quality scientist   Number of children: 1   Years of education: 16   Highest education level: Not on file  Occupational History   Occupation: Engineer, drilling     Employer: [employer]  Tobacco Use   Smoking status: Never    Passive exposure: Never   Smokeless tobacco: Never  Vaping Use   Vaping Use: Never used  Substance and Sexual Activity   Alcohol use: No   Drug use: No   Sexual activity: Yes  Other Topics Concern   Not on file  Social History Narrative   Adopted, so no pertinent FH.  Married.  Lives with wife in [place].  Independent of ADLs and ambulation.   Social Determinants of Health   Financial Resource Strain: Not on file  Food Insecurity: Not on file  Transportation Needs: Not on file  Physical Activity: Not on file  Stress: Not on file  Social Connections:  Not on file  Intimate Partner Violence: Not on file     REVIEW OF SYSTEMS:   [X]  denotes positive finding, [ ]  denotes negative finding Cardiac  Comments:  Chest pain or chest pressure:    Shortness of breath upon exertion: x   Short of breath when lying flat:    Irregular heart rhythm:        Vascular    Pain in calf, thigh, or hip brought on by ambulation:    Pain in feet at night that wakes you up from your sleep:     Blood clot in your veins:    Leg swelling:  x See HPI      Pulmonary    Oxygen at home:    Productive cough:     Wheezing:         Neurologic    Sudden weakness in arms or legs:     Sudden numbness in arms or legs:     Sudden onset of difficulty speaking or slurred speech:    Temporary loss of vision in one eye:     Problems with dizziness:         Gastrointestinal    Blood in stool:     Vomited blood:         Genitourinary    Burning when urinating:     Blood in urine:        Psychiatric    Major depression:         Hematologic    Bleeding problems:    Problems with blood clotting too easily:        Skin    Rashes or ulcers:        Constitutional    Fever or chills:      PHYSICAL EXAMINATION:  Today's Vitals   06/11/22 1353  BP: 118/68  Pulse: 78  Resp: 18  Temp: 98 F (36.7 C)  TempSrc: Temporal  SpO2: 95%  Weight: 235 lb (106.6 kg)  Height: 5\' 10"  (1.778 m)   Body mass index is 33.72 kg/m.   General:  WDWN in NAD; vital signs documented above Gait: Not observed HENT: WNL, normocephalic Pulmonary: normal non-labored breathing , without wheezing Cardiac: regular HR, without carotid bruits Abdomen: soft, NT; aortic pulse is not palpable Skin: without rashes Vascular Exam/Pulses:  Right Left  Radial 2+ (normal) 2+ (normal)  Femoral 2+ (normal) 2+ (normal)  DP 2+ (normal) 2+ (normal)   Extremities: without ischemic changes, without Gangrene , without cellulitis; without open wounds; incision has healed. BLE swelling  with left > right  Musculoskeletal: no muscle wasting or atrophy  Neurologic: A&O X 3 Psychiatric:  The pt has Normal affect.   Non-Invasive Vascular Imaging:   ABI's/TBI's on 06/11/2022: Right:  0.96/0.72 - Great toe pressure: 97 Left:  0.88/1.02 - Great toe pressure: 138  Arterial duplex on 3/25/224: Left Graft #1: Distal SFA- tibioperoneal trunk  +--------------------+--------+--------+----------+--------+                     PSV cm/sStenosisWaveform  Comments  +--------------------+--------+--------+----------+--------+  Inflow             41              monophasic          +--------------------+--------+--------+----------+--------+  Proximal Anastomosis71              monophasic          +--------------------+--------+--------+----------+--------+  Proximal Graft      75              biphasic            +--------------------+--------+--------+----------+--------+  Mid Graft           89              biphasic            +--------------------+--------+--------+----------+--------+  Distal Graft        90              biphasic            +--------------------+--------+--------+----------+--------+  Distal Anastomosis  83              biphasic            +--------------------+--------+--------+----------+--------+  Outflow            104             biphasic            +--------------------+--------+--------+----------+--------+   Summary:  Left: Patent left distal SFA- tibioperoneal trunk bypass graft without any obvious evidence of hemodynamically significant stenosis.     ASSESSMENT/PLAN:: 57 y.o. male here for follow up for PAD with hx of left distal superficial femoral artery to tibioperoneal trunk bypass graft with ipsilateral nonreversed saphenous vein on 07/28/21 by Dr. Trula Slade for severe left leg claudication.  PAD -pt with palpable DP pulses bilaterally.  His duplex today reveals patent bypass without stenosis.    -continue asa/Vascepa (he is no longer on statin due to hx of elevated liver enzymes) -pt will f/u in one year with LLE arterial duplex and ABI.  He knows to call sooner if any issues before then.   BLE swelling with left > right -pt having significant swelling in BLE.  The left is worse and it became worse several months after his bypass.  He has never had a DVT.  He has not formally been evaluated for venous insufficiency.   -I discussed with pt that he has several reasons for leg swelling including CHF, elevated creatinine.  He has hx of elevated liver enzymes that he states are normal now.  Will have him return in the next month with a venous duplex of the left leg.  I discussed with him that since his saphenous vein has already been removed, he may have some venous reflux in the small saphenous vein but most likely would not be a candidate for a laser ablation.  Discussed we will start with venous duplex and return to see Dr. Trula Slade.  He may need CT venogram to be evaluated for May Thurner. -discussed proper leg elevation.  He was measured for 20-75mmHg thigh high compression stockings today.  Discussed putting them on before getting out of bed and taking them off at night.  Discussed water aerobics and weight loss.  Discussed mobilizing more than sitting and standing as well.   -he had an ulceration on the RLE but this has healed.  He may eventually need venous duplex of RLE.     Leontine Locket, [facility] [phone]  Clinic MD:   Trula Slade

## 2022-06-13 ENCOUNTER — Other Ambulatory Visit: Payer: Self-pay

## 2022-06-13 DIAGNOSIS — M7989 Other specified soft tissue disorders: Secondary | ICD-10-CM

## 2022-06-22 DIAGNOSIS — M7989 Other specified soft tissue disorders: Secondary | ICD-10-CM

## 2022-06-22 NOTE — Progress Notes (Signed)
Pt came in to purchase more stockings. He bought one pair last week and wanted 2 more. His current size has been entered into epic for future use. No questions/complaints at this time.

## 2022-06-26 ENCOUNTER — Ambulatory Visit (HOSPITAL_BASED_OUTPATIENT_CLINIC_OR_DEPARTMENT_OTHER): Payer: Commercial Managed Care - PPO | Admitting: Internal Medicine

## 2022-06-26 ENCOUNTER — Encounter (HOSPITAL_BASED_OUTPATIENT_CLINIC_OR_DEPARTMENT_OTHER): Payer: Self-pay | Admitting: Internal Medicine

## 2022-06-26 VITALS — BP 128/70 | HR 77 | Ht 71.0 in | Wt 236.0 lb

## 2022-06-26 DIAGNOSIS — E783 Hyperchylomicronemia: Secondary | ICD-10-CM | POA: Diagnosis not present

## 2022-06-26 DIAGNOSIS — E781 Pure hyperglyceridemia: Secondary | ICD-10-CM

## 2022-06-26 MED ORDER — ROSUVASTATIN CALCIUM 5 MG PO TABS: 5.0000 mg | ORAL_TABLET | Freq: Every day | ORAL | 3 refills | Status: DC

## 2022-06-26 NOTE — Patient Instructions (Addendum)
Medication Instructions:  START crestor 5mg  daily  *If you need a refill on your cardiac medications before your next appointment, please call your pharmacy*   Lab Work: Hepatic Function Panel in 2 weeks - non-fasting  Lipid Panel and Direct LDL in 6 months ** before next appointment  If you have labs (blood work) drawn today and your tests are completely normal, you will receive your results only by: MyChart Message (if you have MyChart) OR A paper copy in the mail If you have any lab test that is abnormal or we need to change your treatment, we will call you to review the results.    Follow-Up: At [facility], you and your health needs are our priority.  As part of our continuing mission to provide you with exceptional heart care, we have created designated Provider Care Teams.  These Care Teams include your primary Cardiologist (physician) and Advanced Practice Providers (APPs -  Physician Assistants and Nurse Practitioners) who all work together to provide you with the care you need, when you need it.  We recommend signing up for the patient portal called "MyChart".  Sign up information is provided on this After Visit Summary.  MyChart is used to connect with patients for Virtual Visits (Telemedicine).  Patients are able to view lab/test results, encounter notes, upcoming appointments, etc.  Non-urgent messages can be sent to your provider as well.   To learn more about what you can do with MyChart, go to [email or web address].    Your next appointment:   6 month(s)  Provider:   Zoila Shutter, MD - lipid clinic ** call in mid-May/early June for an appointment

## 2022-06-26 NOTE — Progress Notes (Signed)
LIPID CLINIC CONSULT NOTE  Chief Complaint:  No complaints  Primary Care Physician: Emilio Aspen, MD  HPI:  Juan [name] is a 57 y.o. male who is being seen today for the evaluation of high triglycerides at the request of Emilio Aspen, *.  Juan [name] is seen today for evaluation of elevated triglycerides.  He has been a patient of Dr. Hulda Marin at [facility] for many years.  He was last seen there in 2018.  He has hyperchylomicronemia.  Triglycerides have been very high and most recently up to 4730.  He says at best his triglycerides have come down to 450, but this was after very strict low saturated fat and calorie diet.  He says he has not been able to maintain that.  He also has diabetes and his blood sugars have not been optimally controlled.  His most recent A1c in September 2019 was 9.6.  He sees Dr. Talmage [name].  He has been on fenofibrate 160 mg.  In addition he takes low-dose simvastatin 10 mg.  He has a history of steatohepatitis and elevated liver enzymes in the past however most recently his ALT was 62 in September.  He also has clinical coronary disease having been found to have multivessel coronary disease in 2014 and subsequently he underwent coronary artery bypass grafting and has had multivessel PCI.  He was previously considered for niacin therapy however never really took that because of not being able to achieve good glycemic control.  He is not previously been on any omega-3's and had been considered for clinical trials but was not enrolled.  06/02/2018  Juan [name] returns today for follow-up of elevated triglycerides.  Fortunately, he has had a significant increase in his triglycerides since we last saw him.  His most recent labs showed total cholesterol of 515 with triglycerides of 5975.  HDL was 3 and LDL was 6 indicating persistent familial chylomicronemia syndrome.  He does say that his diet has significantly altered recently due to increased demands at work as well  as increased demands on his wife who works in a school [employer].  Although schools are now close due to the COVID-19 virus, she is now working extended hours to produce foods for children who are not at school.  He does report compliance with his medications.  12/29/2018  Juan [name] is seen today for follow-up.  Overall he seems to be doing well without any new chest pain or worsening shortness of breath.  Unfortunate his triglycerides remain elevated.  They have come down some in the mid 3600s.  His hemoglobin A1c is improved as well in the low sevens.  Unfortunately, were not able to get his triglycerides to be normalized at this point.  He is on a strict diet to lower saturated fats.  He is on maximal therapy at this point with fibrate and Vascepa.  We will need to investigate a possible clinical trial options for him.  LDL has been as low as 6.  08/10/2019  Mr. [name] returns for follow-up.  Unfortunately spring was hospitalized with cellulitis/sepsis.  He was on an insulin drip and other things at that time and this may have contributed to a very low triglyceride number although previously he has had significantly elevated triglycerides and his generally never seen numbers below 800.  He continues on maximal standard therapy.  I had discussed with him previously about a clinical trial option for him and now that trial is available.  He is a  candidate for the balance trial and agreeable to participate.  Finally, he is noted to have persistent lower extremity edema.  He has seen Edd Fabian, NP about this who recommended increasing his Lasix.  I would advise increasing it to twice daily for least a couple weeks and he should have close clinical follow-up.  He still has some redness although no warmth and I suspect venous congestion of the right lower extremity.  Several rounds of antibiotics have not improved it.  He did have a ruptured blister today which I dressed.  11/14/2019  Juan [name] is seen  today in follow-up.  He again recently had a wound to the right shin.  This is dressed today.  I looked at it and it seems to be slowly healing.  No evidence of any cellulitis I encouraged him to see the wound care clinic but he was going to try to see if he could doctor it home and follow-up with me if it was not improving.  We did screen him for the balance trial, but his FCS genetic screening was surprisingly negative.  Therefore he likely has severe multifactorial hypertriglyceridemia.  Most recently his triglycerides however have improved significantly down to 209.  He also had some recent chest discomfort.  He saw one of our NP's who had started isosorbide.  He has had some mild relief with this but occasionally gets some symptoms that persist.  05/13/2020  Juan [name] is seen today in follow-up. Unfortunately had a difficult year. He underwent repeat cardiac catheterization with a decline in LVEF showing some occluded grafts. He is now being followed by Dr. Gala Romney in the advanced heart failure clinic. He has been struggling with some lower extremity edema and shortness of breath. His triglycerides remain significantly elevated. Although they are improved by about 50% they were 2689 on February 21. He has not had any recent episodes of pancreatitis. Total cholesterol is 338, HDL is only 10 and LDL cannot be calculated. We evaluated him for the BALANCE trial however he tested genetically negative for FCS. Fortunately, he is a candidate to rollover into the CORE file which I anticipate to start within the next couple of weeks.  11/16/2020  Juan [name] returns today for follow-up.  He continues to struggle with dyslipidemia primarily elevated triglycerides.  Recent labs showed total cholesterol 298, triglycerides 2063, HDL 13.  Direct LDL up somewhat to 21 however he has not been on a statin because of very low cholesterol in the past.  We had evaluated him for the balance trial however his testing for FCS  was negative.  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  He is on fenofibrate and Vascepa.  His triglycerides were as high as 5000 in the past.  He is a good candidate for the core trial as mentioned above.  I spoke with the new nurse that we will be handling enrollment for this and she will plan on reaching out to him to screen within the next couple of weeks.  We also discussed genetic testing for dyslipidemia today and he was interested in that.  06/26/2022  Mr. Biehn is seen today in follow-up.  He is no longer in the clinical research trial regarding his lipids.  Unfortunately he has developed lifestyle limiting claudication and underwent left lower extremity bypass surgery by vascular.  He has had issues with venous stasis and edema in that leg.  He has upcoming venous reflux  studies and follow-up with Dr. Myra Gianotti.  He is also managed in the heart failure clinic for some cardiomyopathy.  His cholesterol has been variable.  In December his triglycerides were under 500 which is unusually low for him.  He did have repeat lipid testing from his endocrinologist about 4 days ago showing total cholesterol 151, triglycerides 803 and HDL 15.  His LDLs remain very low and given his history of elevated liver enzymes on statins he has not been on statin therapy.  PMHx:  Past Medical History:  Diagnosis Date   Abnormal cardiovascular stress test 02/06/2013   Anemia    occasional - no problems currently(10/02/2011)   Bilateral renal cysts 06/16/2011   states no known problems   Blood transfusion without reported diagnosis    CAD (coronary artery disease), LAD 90%, 1st diag 95%, LCX 70% wth AV groove 90%, RCA 40-50% mid and 80% long distal stenosis 02/03/13 02/04/2013   Chest pain    positive Myoview stress test   CHF (congestive heart failure)    Coronary artery calcification seen on CAT scan    Diabetes mellitus    IDDM   Fatty liver  disease, nonalcoholic 06/16/2011   Hyperlipidemia    Hypertension    states no dx. of HTN, takes med. to protect kidneys due to DM   Lateral meniscus tear 09/2011   left   Loose body in knee 09/2011   loose bodies left knee   Non Hodgkin's lymphoma 1991   Pancreatitis    occasional - last episode 06/2011   Pneumonia    S/P CABG x 4, 02/05/13 LIMA-LAD; LT. RADIAL-OM;VG-DIAG; VG-PDA 02/06/2013   10/18 3/4 patent grafts (occluded SVG-->Diag), PCI/DESx 3 SVG-->RCA, normal EF   Sleep apnea    mild per patient   Splenomegaly, congestive, chronic    Stuffy and runny nose 10/02/2011   yellow drainage from nose    Past Surgical History:  Procedure Laterality Date   ABDOMINAL AORTIC ANEURYSM REPAIR     ABDOMINAL AORTOGRAM W/LOWER EXTREMITY N/A 07/18/2021   Procedure: ABDOMINAL AORTOGRAM W/LOWER EXTREMITY;  Surgeon: Nada Libman, MD;  Location: [facility];  Service: Cardiovascular;  Laterality: N/A;   ANTERIOR CERVICAL DECOMP/DISCECTOMY FUSION N/A 03/26/2018   Procedure: ANTERIOR CERVICAL DECOMPRESSION FUSION CERVICAL 5-6 WITH INSTRUMENTATION AND ALLOGRAFT;  Surgeon: Estill Bamberg, MD;  Location: [facility] OR;  Service: Orthopedics;  Laterality: N/A;   CARDIAC CATHETERIZATION     CORONARY ARTERY BYPASS GRAFT N/A 02/05/2013   Procedure: CORONARY ARTERY BYPASS GRAFTING (CABG);  Surgeon: Kerin Perna, MD;  Location: [facility] OR;  Service: Open Heart Surgery;  Laterality: N/A;  Coronary artery bypass graft on pump times four using left internal mammary artery and right greater saphenous vein via endovein harvest and left radial artery harvest.    CORONARY STENT INTERVENTION  12/17/2016    PCI and drug-eluting stenting of the mid and distal RCA SVG    CORONARY STENT INTERVENTION N/A 12/17/2016   Procedure: CORONARY STENT INTERVENTION;  Surgeon: Runell Gess, MD;  Location: [facility];  Service: Cardiovascular;  Laterality: N/A;   ELBOW SURGERY     FEMORAL-POPLITEAL BYPASS GRAFT  Left 07/28/2021   Procedure: LEFT FEMORAL-BELOW KNEE POPLITEAL ARTERY BYPASS GRAFT;  Surgeon: Nada Libman, MD;  Location: [facility] OR;  Service: Vascular;  Laterality: Left;   HERNIA REPAIR     inguinal    herniated disc     INTRAOPERATIVE TRANSESOPHAGEAL ECHOCARDIOGRAM N/A 02/05/2013   Procedure: INTRAOPERATIVE TRANSESOPHAGEAL ECHOCARDIOGRAM;  Surgeon: Kerin PernaPeter Van Trigt, MD;  [facility];  Service: Open Heart Surgery;  Laterality: N/A;   KNEE ARTHROSCOPY  10/09/2011   Procedure: ARTHROSCOPY KNEE;  Surgeon: Mable ParisJustin William Chandler, MD;  Location: [facility];  Service: Orthopedics;  Laterality: Left;   LEFT HEART CATH AND CORS/GRAFTS ANGIOGRAPHY N/A 12/17/2016   Procedure: LEFT HEART CATH AND CORS/GRAFTS ANGIOGRAPHY;  Surgeon: Runell GessBerry, Jonathan J, MD;  [facility];  Service: Cardiovascular;  Laterality: N/A;   LEFT HEART CATHETERIZATION WITH CORONARY ANGIOGRAM N/A 02/03/2013   Procedure: LEFT HEART CATHETERIZATION WITH CORONARY ANGIOGRAM;  Surgeon: Runell GessJonathan J Berry, MD;  [facility];  Service: Cardiovascular;  Laterality: N/A;   NASAL SEPTUM SURGERY     PARATHYROIDECTOMY Right 11/23/2021   Procedure: RIGHT INFERIOR PARATHYROIDECTOMY;  Surgeon: Darnell LevelGerkin, Todd, MD;  Location: [facility] ORS;  Service: General;  Laterality: Right;   RADIAL ARTERY HARVEST Left 02/05/2013   Procedure: RADIAL ARTERY HARVEST;  Surgeon: Kerin PernaPeter Van Trigt, MD;  [facility];  Service: Vascular;  Laterality: Left;   RIGHT/LEFT HEART CATH AND CORONARY/GRAFT ANGIOGRAPHY N/A 02/15/2020   Procedure: RIGHT/LEFT HEART CATH AND CORONARY/GRAFT ANGIOGRAPHY;  Surgeon: Runell GessBerry, Jonathan J, MD;  [facility];  Service: Cardiovascular;  Laterality: N/A;   TEE WITHOUT CARDIOVERSION N/A 02/18/2020   Procedure: TRANSESOPHAGEAL ECHOCARDIOGRAM (TEE);  Surgeon: Dolores PattyBensimhon, Daniel R, MD;  [facility];  Service: Cardiovascular;  Laterality: N/A;   TIBIA BONE BIOPSY  x 3   left    TONSILLECTOMY     VEIN HARVEST Left 07/28/2021   Procedure: HARVEST OF GREAT SAPHENOUS VEIN;  Surgeon: Nada LibmanBrabham, Vance W, MD;  Location: [facility] OR;  Service: Vascular;  Laterality: Left;    FAMHx:  Family History  Adopted: Yes    SOCHx:   reports that he has never smoked. He has never been exposed to tobacco smoke. He has never used smokeless tobacco. He reports that he does not drink alcohol and does not use drugs.  ALLERGIES:  Allergies  Allergen Reactions   Empagliflozin Other (See Comments)    Caused skin infection   Reglan [Metoclopramide] Other (See Comments)    Hyperactivity with higher doses (can tolerate lower doses)    Sglt2 Inhibitors Other (See Comments)    Cellulitis with Jardiance and Farxiga    ROS: Pertinent items noted in HPI and remainder of comprehensive ROS otherwise negative.  HOME MEDS: Current Outpatient Medications on File Prior to Visit  Medication Sig Dispense Refill   aspirin 81 MG chewable tablet Chew 1 tablet (81 mg total) by mouth daily.     clopidogrel (PLAVIX) 75 MG tablet Take 1 tablet (75 mg total) by mouth daily. 90 tablet 3   Continuous Blood Gluc Receiver (DEXCOM G7 RECEIVER) DEVI by Does not apply route.     ENTRESTO 49-51 MG TAKE 1 TABLET BY MOUTH TWICE A DAY (PRIOR AUTH REQUIRED FOR COPAY CARD) 60 tablet 11   fenofibrate 160 MG tablet Take 160 mg by mouth in the morning.     Glucose Blood (BAYER CONTOUR NEXT TEST VI) glucose testing     insulin regular human CONCENTRATED (HUMULIN R) 500 UNIT/ML injection Inject into the skin continuous. Pump setting 125 delivers up to 630 units (max daily dosage via pump)     isosorbide mononitrate (IMDUR) 30 MG 24 hr tablet TAKE 1 TABLET BY MOUTH EVERY DAY 90 tablet 3   metFORMIN (GLUCOPHAGE) 1000 MG tablet Take 1,000 mg by mouth 2 (two) times daily  with a meal.     metoprolol succinate (TOPROL-XL) 25 MG 24 hr tablet TAKE 3 TABLETS BY MOUTH TWICE A DAY WITH OR IMMEDIATELY FOLLOWING A MEAL 540 tablet 3    nitroGLYCERIN (NITROSTAT) 0.4 MG SL tablet Place 1 tablet (0.4 mg total) under the tongue every 5 (five) minutes as needed. 25 tablet 2   Propylene Glycol (SYSTANE BALANCE) 0.6 % SOLN Place 1 drop into both eyes 4 (four) times daily as needed (dry eyes).     spironolactone (ALDACTONE) 25 MG tablet TAKE 1 TABLET (25 MG TOTAL) BY MOUTH DAILY. 90 tablet 3   torsemide (DEMADEX) 20 MG tablet TAKE 2 TABLETS BY MOUTH 2 TIMES DAILY. 120 tablet 0   TRESIBA FLEXTOUCH 200 UNIT/ML FlexTouch Pen Inject 36 Units into the skin in the morning.     VASCEPA 1 g capsule TAKE 2 CAPSULES BY MOUTH TWICE A DAY 360 capsule 4   No current facility-administered medications on file prior to visit.    LABS/IMAGING: No results found for this or any previous visit (from the past 48 hour(s)). No results found.  LIPID PANEL:    Component Value Date/Time   CHOL 298 (H) 11/07/2020 0923   TRIG 2,063 (HH) 11/07/2020 0923   HDL 13 (L) 11/07/2020 0923   CHOLHDL 22.9 (H) 11/07/2020 0923   CHOLHDL 14.2 06/17/2011 0400   VLDL UNABLE TO CALCULATE IF TRIGLYCERIDE OVER 400 mg/dL 54/11/8117 1478   LDLCALC Comment (A) 11/07/2020 0923   LDLDIRECT 21 11/07/2020 0924    WEIGHTS: Wt Readings from Last 3 Encounters:  06/26/22 236 lb (107 kg)  06/11/22 235 lb (106.6 kg)  05/25/22 238 lb (108 kg)    VITALS: BP 128/70   Pulse 77   Ht 5\' 11"  (1.803 m)   Wt 236 lb (107 kg)   SpO2 94%   BMI 32.92 kg/m   EXAM: Deferred  EKG: Deferred  ASSESSMENT: Multifactorial severe hyperchyomicronemia -FCS genetically negative ASCVD with prior four-vessel CABG and PCI Insulin-dependent diabetes Hypertension  PLAN: 1.   Mr. Sama has persistently elevated triglycerides.  His A1c was over 10% and he is on an insulin pump.  He has some issues with low blood sugar at times but I suspect this is also contributing to his high triglycerides.  He has follow-up with Dr. Talmage [name] who hopefully can find some other options to lower his A1c.  For  now we will continue on his combination therapy with Vascepa and fenofibrate.  He had previously been on low-dose rosuvastatin.  I was contemplating whether or not to restart this or not.  It is guideline recommended with his PAD to be on a statin although I am concerned about possible side effects with this.  After some consideration, I feel like he might benefit from low-dose rosuvastatin 5 mg daily.  Will contact him with this recommendation.  Plan repeat lipids in about 6 months and follow-up afterwards.  Chrystie Nose, MD, [facility], FACP  [place]  [facility]  Medical Director of the Advanced Lipid Disorders &  Cardiovascular Risk Reduction Clinic Diplomate of the American Board of Clinical Lipidology Attending Cardiologist  Direct Dial: [phone]  Fax: [phone]  Website:  [email or web address] Nicely Joanne Brander 06/26/2022, 11:39 AM

## 2022-07-09 ENCOUNTER — Ambulatory Visit (HOSPITAL_COMMUNITY)
Admission: RE | Admit: 2022-07-09 | Discharge: 2022-07-09 | Disposition: A | Discharge status: Home / Self Care | Payer: Commercial Managed Care - PPO | Source: Ambulatory Visit | Attending: Vascular Surgery | Admitting: Vascular Surgery

## 2022-07-09 ENCOUNTER — Ambulatory Visit: Payer: Commercial Managed Care - PPO | Admitting: Surgery

## 2022-07-09 ENCOUNTER — Encounter: Payer: Self-pay | Admitting: Surgery

## 2022-07-09 VITALS — BP 116/73 | HR 73 | Temp 97.3°F | Resp 18 | Ht 71.0 in | Wt 239.0 lb

## 2022-07-09 DIAGNOSIS — I890 Lymphedema, not elsewhere classified: Secondary | ICD-10-CM | POA: Diagnosis not present

## 2022-07-09 DIAGNOSIS — M7989 Other specified soft tissue disorders: Secondary | ICD-10-CM | POA: Diagnosis not present

## 2022-07-09 DIAGNOSIS — I70212 Atherosclerosis of native arteries of extremities with intermittent claudication, left leg: Secondary | ICD-10-CM

## 2022-07-09 NOTE — Progress Notes (Signed)
Vascular and Vein Specialist of [facility]  Patient name: Juan [name] MRN: [id number] DOB: [birth year] Sex: male   REASON FOR VISIT:    Follow up  HISOTRY OF PRESENT ILLNESS:    Juan [name] is a 57 y.o. male who is status post left distal superficial femoral artery to tibioperoneal trunk bypass graft with ipsilateral nonreversed saphenous vein on 07/28/2021 for severe left leg claudication.  He has a history of non-Hodgkin's lymphoma with radiation to the left knee in 1990.  This is likely why he has been having trouble with poor healing.  He continues to complain of left leg swelling.  He was scratched by his cat over the weekend and had drainage from the puncture site for an hour.  He has been wearing compression socks.  He keeps his leg elevated.  He does routine walking for exercise particularly at work.  This has been going on for a long time without significant improvement.  Patient has a history of congestive heart failure.  He cannot take statin secondary to elevated liver enzymes.  He denies any history of DVT.  He has undergone parathyroidectomy. PAST MEDICAL HISTORY:   Past Medical History:  Diagnosis Date   Abnormal cardiovascular stress test 02/06/2013   Anemia    occasional - no problems currently(10/02/2011)   Bilateral renal cysts 06/16/2011   states no known problems   Blood transfusion without reported diagnosis    CAD (coronary artery disease), LAD 90%, 1st diag 95%, LCX 70% wth AV groove 90%, RCA 40-50% mid and 80% long distal stenosis 02/03/13 02/04/2013   Chest pain    positive Myoview stress test   CHF (congestive heart failure)    Coronary artery calcification seen on CAT scan    Diabetes mellitus    IDDM   Fatty liver disease, nonalcoholic 06/16/2011   Hyperlipidemia    Hypertension    states no dx. of HTN, takes med. to protect kidneys due to DM   Lateral meniscus tear 09/2011   left   Loose body in knee 09/2011    loose bodies left knee   Non Hodgkin's lymphoma 1991   Pancreatitis    occasional - last episode 06/2011   Pneumonia    S/P CABG x 4, 02/05/13 LIMA-LAD; LT. RADIAL-OM;VG-DIAG; VG-PDA 02/06/2013   10/18 3/4 patent grafts (occluded SVG-->Diag), PCI/DESx 3 SVG-->RCA, normal EF   Sleep apnea    mild per patient   Splenomegaly, congestive, chronic    Stuffy and runny nose 10/02/2011   yellow drainage from nose     FAMILY HISTORY:   Family History  Adopted: Yes    SOCIAL HISTORY:   Social History   Tobacco Use   Smoking status: Never    Passive exposure: Never   Smokeless tobacco: Never  Substance Use Topics   Alcohol use: No     ALLERGIES:   Allergies  Allergen Reactions   Empagliflozin Other (See Comments)    Caused skin infection   Reglan [Metoclopramide] Other (See Comments)    Hyperactivity with higher doses (can tolerate lower doses)    Sglt2 Inhibitors Other (See Comments)    Cellulitis with Jardiance and Farxiga     CURRENT MEDICATIONS:   Current Outpatient Medications  Medication Sig Dispense Refill   aspirin 81 MG chewable tablet Chew 1 tablet (81 mg total) by mouth daily.     clopidogrel (PLAVIX) 75 MG tablet Take 1 tablet (75 mg total) by mouth daily. 90 tablet 3   Continuous  Blood Gluc Receiver (DEXCOM G7 RECEIVER) DEVI by Does not apply route.     ENTRESTO 49-51 MG TAKE 1 TABLET BY MOUTH TWICE A DAY (PRIOR AUTH REQUIRED FOR COPAY CARD) 60 tablet 11   fenofibrate 160 MG tablet Take 160 mg by mouth in the morning.     Glucose Blood (BAYER CONTOUR NEXT TEST VI) glucose testing     insulin regular human CONCENTRATED (HUMULIN R) 500 UNIT/ML injection Inject into the skin continuous. Pump setting 125 delivers up to 630 units (max daily dosage via pump)     isosorbide mononitrate (IMDUR) 30 MG 24 hr tablet TAKE 1 TABLET BY MOUTH EVERY DAY 90 tablet 3   metFORMIN (GLUCOPHAGE) 1000 MG tablet Take 1,000 mg by mouth 2 (two) times daily with a meal.      metoprolol succinate (TOPROL-XL) 25 MG 24 hr tablet TAKE 3 TABLETS BY MOUTH TWICE A DAY WITH OR IMMEDIATELY FOLLOWING A MEAL 540 tablet 3   nitroGLYCERIN (NITROSTAT) 0.4 MG SL tablet Place 1 tablet (0.4 mg total) under the tongue every 5 (five) minutes as needed. 25 tablet 2   Propylene Glycol (SYSTANE BALANCE) 0.6 % SOLN Place 1 drop into both eyes 4 (four) times daily as needed (dry eyes).     rosuvastatin (CRESTOR) 5 MG tablet Take 1 tablet (5 mg total) by mouth daily. 90 tablet 3   spironolactone (ALDACTONE) 25 MG tablet TAKE 1 TABLET (25 MG TOTAL) BY MOUTH DAILY. 90 tablet 3   torsemide (DEMADEX) 20 MG tablet TAKE 2 TABLETS BY MOUTH 2 TIMES DAILY. 120 tablet 0   TRESIBA FLEXTOUCH 200 UNIT/ML FlexTouch Pen Inject 36 Units into the skin in the morning.     VASCEPA 1 g capsule TAKE 2 CAPSULES BY MOUTH TWICE A DAY 360 capsule 4   No current facility-administered medications for this visit.    REVIEW OF SYSTEMS:   [X]  denotes positive finding, [ ]  denotes negative finding Cardiac  Comments:  Chest pain or chest pressure:    Shortness of breath upon exertion:    Short of breath when lying flat:    Irregular heart rhythm:        Vascular    Pain in calf, thigh, or hip brought on by ambulation:    Pain in feet at night that wakes you up from your sleep:     Blood clot in your veins:    Leg swelling:  x       Pulmonary    Oxygen at home:    Productive cough:     Wheezing:         Neurologic    Sudden weakness in arms or legs:     Sudden numbness in arms or legs:     Sudden onset of difficulty speaking or slurred speech:    Temporary loss of vision in one eye:     Problems with dizziness:         Gastrointestinal    Blood in stool:     Vomited blood:         Genitourinary    Burning when urinating:     Blood in urine:        Psychiatric    Major depression:         Hematologic    Bleeding problems:    Problems with blood clotting too easily:        Skin    Rashes or  ulcers:        Constitutional  Fever or chills:      PHYSICAL EXAM:   Vitals:   07/09/22 1143  BP: 116/73  Pulse: 73  Resp: 18  Temp: (!) 97.3 F (36.3 C)  TempSrc: Temporal  SpO2: 94%  Weight: 239 lb (108.4 kg)  Height:  (1.803 m)    GENERAL: The patient is a well-nourished male, in no acute distress. The vital signs are documented above. CARDIAC: There is a regular rate and rhythm.  VASCULAR: 2-3+ bilateral pitting edema.  Left greater than right PULMONARY: Non-labored respirations MUSCULOSKELETAL: There are no major deformities or cyanosis. NEUROLOGIC: No focal weakness or paresthesias are detected. SKIN: See photo below PSYCHIATRIC: The patient has a normal affect.  STUDIES:   I have reviewed the following: Left:  - No evidence of deep vein thrombosis from the common femoral through the  popliteal veins.  - No evidence of superficial venous thrombosis.  - The mid femoral vein is not competent.  - The great saphenous vein is competent from mid thigh to calf (proximal  segment is harvested).  - The small saphenous vein is competent.    MEDICAL ISSUES:   Status post left femoral-tibial bypass: His last ultrasound was 3 months ago which showed a widely patent bypass without stenosis.  He will get surveillance imaging in 6 months  Leg swelling: He has bilateral edema but the left leg is worse, since his surgery.  I suspect this is secondary to lymphatic disruption or the saphenectomy.  He is fairly optimized from his heart failure perspective and because of his renal insufficiency, likely cannot go through more diuresis.  His reflux exam today was fairly unremarkable.  After reviewing old CT scans, he does not have May Thurner syndrome.  I think this is just worsening lymphedema.  Therefore, I have encouraged him to continue wearing his compression socks.  I am also going to work on getting him a lymphedema pump.  He has been wearing compression socks and walking  for greater than 4 weeks.  He has drainage periodically especially after getting a puncture wound from his cat.  He does have some skin discoloration.  I think he would benefit greatly from a lymphedema pump    Charlena Cross, MD, FACS [facility] and Vein Specialists of [facility] [phone] Pager [phone]

## 2022-07-18 ENCOUNTER — Other Ambulatory Visit: Payer: Self-pay

## 2022-07-18 DIAGNOSIS — I739 Peripheral vascular disease, unspecified: Secondary | ICD-10-CM

## 2022-07-18 DIAGNOSIS — I70212 Atherosclerosis of native arteries of extremities with intermittent claudication, left leg: Secondary | ICD-10-CM

## 2022-08-07 ENCOUNTER — Other Ambulatory Visit (HOSPITAL_COMMUNITY): Payer: Self-pay | Admitting: Internal Medicine

## 2022-08-23 ENCOUNTER — Other Ambulatory Visit (HOSPITAL_COMMUNITY): Payer: Self-pay | Admitting: Family Medicine

## 2022-12-20 ENCOUNTER — Other Ambulatory Visit (HOSPITAL_COMMUNITY): Payer: Self-pay | Admitting: Internal Medicine

## 2023-01-03 ENCOUNTER — Other Ambulatory Visit (HOSPITAL_COMMUNITY): Payer: Self-pay

## 2023-01-04 ENCOUNTER — Other Ambulatory Visit: Payer: Self-pay | Admitting: *Deleted

## 2023-01-07 ENCOUNTER — Telehealth (HOSPITAL_COMMUNITY): Payer: Self-pay

## 2023-01-07 DIAGNOSIS — I5022 Chronic systolic (congestive) heart failure: Secondary | ICD-10-CM

## 2023-01-07 MED ORDER — TORSEMIDE 20 MG PO TABS: 40.0000 mg | ORAL_TABLET | Freq: Two times a day (BID) | ORAL | 0 refills | Status: DC

## 2023-01-07 NOTE — Telephone Encounter (Signed)
Patient called. Voice message left to return a call to our office to schedule a appointment to receive future refill of his torsemide medication. A 30 day supply was sent to his pharmacy.

## 2023-01-14 ENCOUNTER — Encounter: Payer: Self-pay | Admitting: Surgery

## 2023-01-14 ENCOUNTER — Ambulatory Visit (INDEPENDENT_AMBULATORY_CARE_PROVIDER_SITE_OTHER)
Admission: RE | Admit: 2023-01-14 | Discharge: 2023-01-14 | Disposition: A | Discharge status: Home / Self Care | Payer: Commercial Managed Care - PPO | Source: Ambulatory Visit | Attending: Surgery | Admitting: Surgery

## 2023-01-14 ENCOUNTER — Ambulatory Visit: Payer: Commercial Managed Care - PPO | Admitting: Surgery

## 2023-01-14 ENCOUNTER — Ambulatory Visit (HOSPITAL_COMMUNITY)
Admission: RE | Admit: 2023-01-14 | Discharge: 2023-01-14 | Disposition: A | Discharge status: Home / Self Care | Payer: Commercial Managed Care - PPO | Source: Ambulatory Visit | Attending: Surgery | Admitting: Surgery

## 2023-01-14 VITALS — BP 123/74 | HR 77 | Temp 98.2°F | Resp 20 | Ht 71.0 in | Wt 240.0 lb

## 2023-01-14 DIAGNOSIS — I70212 Atherosclerosis of native arteries of extremities with intermittent claudication, left leg: Secondary | ICD-10-CM

## 2023-01-14 DIAGNOSIS — I872 Venous insufficiency (chronic) (peripheral): Secondary | ICD-10-CM

## 2023-01-14 DIAGNOSIS — I890 Lymphedema, not elsewhere classified: Secondary | ICD-10-CM | POA: Diagnosis not present

## 2023-01-14 DIAGNOSIS — I739 Peripheral vascular disease, unspecified: Secondary | ICD-10-CM

## 2023-01-14 LAB — VAS US ABI WITH/WO TBI
Left ABI: 1.07
Right ABI: 1.11

## 2023-01-14 NOTE — Progress Notes (Signed)
Vascular and Vein Specialist of [facility]  Patient name: Juan [name] MRN: [id number] DOB: 11/06/1965 Sex: male   REASON FOR VISIT:    Follow up  HISOTRY OF PRESENT ILLNESS:    Juan [name] is a 57 y.o. male who is status post left distal superficial femoral artery to tibioperoneal trunk bypass graft with ipsilateral nonreversed saphenous vein on 07/28/2021.  This was done for severe left leg claudication.  His leg symptoms have resolved.  Patient has a history of non-Hodgkin's lymphoma with left knee radiation in 1990.  This is a likely contributor to wound issues after surgery.  He now has persistent bilateral leg swelling, however the left foot is worse after surgery.  I suspect this is secondary to lymphatic disruption, as he did not appear to have evidence of May Thurner syndrome on CT scan, and ultrasound reflex examination was unremarkable.  He does have a history of congestive heart failure and now sees a nephrologist for chronic renal insufficiency.  Patient has a history of congestive heart failure. He cannot take statin secondary to elevated liver enzymes. He denies any history of DVT. He has undergone parathyroidectomy.   PAST MEDICAL HISTORY:   Past Medical History:  Diagnosis Date   Abnormal cardiovascular stress test 02/06/2013   Anemia    occasional - no problems currently(10/02/2011)   Bilateral renal cysts 06/16/2011   states no known problems   Blood transfusion without reported diagnosis    CAD (coronary artery disease), LAD 90%, 1st diag 95%, LCX 70% wth AV groove 90%, RCA 40-50% mid and 80% long distal stenosis 02/03/13 02/04/2013   Chest pain    positive Myoview stress test   CHF (congestive heart failure) (HCC)    Coronary artery calcification seen on CAT scan    Diabetes mellitus    IDDM   Fatty liver disease, nonalcoholic 06/16/2011   Hyperlipidemia    Hypertension    states no dx. of HTN, takes med. to protect  kidneys due to DM   Lateral meniscus tear 09/2011   left   Loose body in knee 09/2011   loose bodies left knee   Non Hodgkin's lymphoma (HCC) 1991   Pancreatitis    occasional - last episode 06/2011   Pneumonia    S/P CABG x 4, 02/05/13 LIMA-LAD; LT. RADIAL-OM;VG-DIAG; VG-PDA 02/06/2013   10/18 3/4 patent grafts (occluded SVG-->Diag), PCI/DESx 3 SVG-->RCA, normal EF   Sleep apnea    mild per patient   Splenomegaly, congestive, chronic    Stuffy and runny nose 10/02/2011   yellow drainage from nose     FAMILY HISTORY:   Family History  Adopted: Yes    SOCIAL HISTORY:   Social History   Tobacco Use   Smoking status: Never    Passive exposure: Never   Smokeless tobacco: Never  Substance Use Topics   Alcohol use: No     ALLERGIES:   Allergies  Allergen Reactions   Empagliflozin Other (See Comments)    Caused skin infection   Reglan [Metoclopramide] Other (See Comments)    Hyperactivity with higher doses (can tolerate lower doses)    Sglt2 Inhibitors Other (See Comments)    Cellulitis with Jardiance and Farxiga     CURRENT MEDICATIONS:   Current Outpatient Medications  Medication Sig Dispense Refill   aspirin 81 MG chewable tablet Chew 1 tablet (81 mg total) by mouth daily.     clopidogrel (PLAVIX) 75 MG tablet Take 1 tablet (75 mg total) by mouth  daily. 90 tablet 3   Continuous Blood Gluc Receiver (DEXCOM G7 RECEIVER) DEVI by Does not apply route.     fenofibrate 160 MG tablet Take 160 mg by mouth in the morning.     Glucose Blood (BAYER CONTOUR NEXT TEST VI) glucose testing     insulin regular human CONCENTRATED (HUMULIN R) 500 UNIT/ML injection Inject into the skin continuous. Pump setting 125 delivers up to 630 units (max daily dosage via pump)     isosorbide mononitrate (IMDUR) 30 MG 24 hr tablet TAKE 1 TABLET BY MOUTH EVERY DAY 90 tablet 3   metFORMIN (GLUCOPHAGE) 1000 MG tablet Take 1,000 mg by mouth 2 (two) times daily with a meal.     metoprolol  succinate (TOPROL-XL) 25 MG 24 hr tablet TAKE 3 TABLETS BY MOUTH TWICE A DAY WITH OR IMMEDIATELY FOLLOWING A MEAL 540 tablet 3   nitroGLYCERIN (NITROSTAT) 0.4 MG SL tablet Place 1 tablet (0.4 mg total) under the tongue every 5 (five) minutes as needed. 25 tablet 2   Propylene Glycol (SYSTANE BALANCE) 0.6 % SOLN Place 1 drop into both eyes 4 (four) times daily as needed (dry eyes).     rosuvastatin (CRESTOR) 5 MG tablet Take 1 tablet (5 mg total) by mouth daily. 90 tablet 3   sacubitril-valsartan (ENTRESTO) 49-51 MG Take 1 tablet by mouth 2 (two) times daily. NEEDS FOLLOW UP APPOINTMENT FOR MORE REFILLS 60 tablet 6   spironolactone (ALDACTONE) 25 MG tablet TAKE 1 TABLET BY MOUTH DAILY. 90 tablet 2   torsemide (DEMADEX) 20 MG tablet Take 2 tablets (40 mg total) by mouth 2 (two) times daily. NEEDS FOLLOW UP APPOINTMENT FOR MORE REFILLS 120 tablet 0   TRESIBA FLEXTOUCH 200 UNIT/ML FlexTouch Pen Inject 36 Units into the skin in the morning.     VASCEPA 1 g capsule TAKE 2 CAPSULES BY MOUTH TWICE A DAY 360 capsule 4   No current facility-administered medications for this visit.    REVIEW OF SYSTEMS:   [X]  denotes positive finding, [ ]  denotes negative finding Cardiac  Comments:  Chest pain or chest pressure:    Shortness of breath upon exertion:    Short of breath when lying flat:    Irregular heart rhythm:        Vascular    Pain in calf, thigh, or hip brought on by ambulation:    Pain in feet at night that wakes you up from your sleep:     Blood clot in your veins:    Leg swelling:  x       Pulmonary    Oxygen at home:    Productive cough:     Wheezing:         Neurologic    Sudden weakness in arms or legs:     Sudden numbness in arms or legs:     Sudden onset of difficulty speaking or slurred speech:    Temporary loss of vision in one eye:     Problems with dizziness:         Gastrointestinal    Blood in stool:     Vomited blood:         Genitourinary    Burning when  urinating:     Blood in urine:        Psychiatric    Major depression:         Hematologic    Bleeding problems:    Problems with blood clotting too easily:  Skin    Rashes or ulcers:        Constitutional    Fever or chills:      PHYSICAL EXAM:   Vitals:   01/14/23 1050  BP: 123/74  Pulse: 77  Resp: 20  Temp: 98.2 F (36.8 C)  SpO2: 95%  Weight: 240 lb (108.9 kg)  Height: 5\' 11"  (1.803 m)    GENERAL: The patient is a well-nourished male, in no acute distress. The vital signs are documented above. CARDIAC: There is a regular rate and rhythm.  VASCULAR: Bilateral pitting edema PULMONARY: Non-labored respirations  MUSCULOSKELETAL: There are no major deformities or cyanosis. NEUROLOGIC: No focal weakness or paresthesias are detected. SKIN: There are no ulcers or rashes noted. PSYCHIATRIC: The patient has a normal affect.  STUDIES:   I have reviewed the following: Left: Patent femoral-popliteal artery bypass graft with inflow artery.  Inflow artery velocity is suggestive of 30-49% stenosis without evidence  of significant plaque formation.   +-------+-----------+-----------+------------+------------+  ABI/TBIToday's ABIToday's TBIPrevious ABIPrevious TBI  +-------+-----------+-----------+------------+------------+  Right 1.11       0.86       0.96        0.72          +-------+-----------+-----------+------------+------------+  Left  1.07       0.88       0.88        1.02          +-------+-----------+-----------+------------+------------+  Right toe pressure: 98 Left toe pressure: 100 All waveforms are multiphasic MEDICAL ISSUES:   Claudication: Bypass graft is widely patent.  Will plan on repeat ultrasound in 9 months on the Friday as this is his day off..  He does not want to take a vacation day for another doctor's visit  Leg swelling: I am making a referral to lymphedema clinic.  The patient has participated in a self-directed  exercise program for at least 6 months.  He tries to wear compression socks.  He keeps his legs elevated when possible.  He is doing everything to manage his leg swelling, however this is a persistent problem with him.  He is developing lipo dermatosclerosis and hyperpigmentation as result of the chronic edema.  I think he would benefit from lymphedema pumps.  We will make a submission.    Charlena Cross, MD, FACS Vascular and Vein Specialists of [facility] [phone] Pager [phone]

## 2023-01-26 LAB — HEPATIC FUNCTION PANEL
ALT: 29 [IU]/L (ref 0–44)
AST: 19 [IU]/L (ref 0–40)
Albumin: 4.1 g/dL (ref 3.8–4.9)
Alkaline Phosphatase: 53 [IU]/L (ref 44–121)
Bilirubin Total: 0.4 mg/dL (ref 0.0–1.2)
Bilirubin, Direct: 0.19 mg/dL (ref 0.00–0.40)
Total Protein: 7.2 g/dL (ref 6.0–8.5)

## 2023-01-26 LAB — LDL CHOLESTEROL, DIRECT: LDL Direct: 18 mg/dL (ref 0–99)

## 2023-01-26 LAB — LIPID PANEL
Chol/HDL Ratio: 11.4 {ratio} — ABNORMAL HIGH (ref 0.0–5.0)
Cholesterol, Total: 137 mg/dL (ref 100–199)
HDL: 12 mg/dL — ABNORMAL LOW (ref >39)
Triglycerides: 1345 mg/dL (ref 0–149)

## 2023-01-29 ENCOUNTER — Other Ambulatory Visit (HOSPITAL_COMMUNITY): Payer: Self-pay | Admitting: Family Medicine

## 2023-01-29 DIAGNOSIS — I5022 Chronic systolic (congestive) heart failure: Secondary | ICD-10-CM

## 2023-01-29 NOTE — Progress Notes (Unsigned)
Cardiology Office Note:  .   Date:  01/30/2023  ID:  Juan [name], DOB January 17, 1966, MRN [id number] PCP: Emilio Aspen, MD  [facility] Providers Cardiologist:  [name], MD    Patient Profile: .      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 Rennis Golden 02/07/2018. History of hypertriglyceridemai with levels in the 5000s. He reported [name] he has seen them is 450 but this was following a very strict low calorie and low saturated fat diet. History of diabetes with difficult to control blood sugars. In 12/2018, he was on maximal therapy with fibrate, Vascepa, and statin and was following a strict diet and triglycerides were 3600; LDL had been as low as 6 and statin was discontinued.  A1C was in the low 7s.   Admission for cellulitis/sepsis in 2021 contributed to very low triglyceride numbers, thought to be 2/2 being on insulin drip. He was screened for Balance tril but his FCS genetic screening was surprisingly negative. Therefore he likely has severe multifactorial hypertriglyceridemia. Trigs improved to 209. He was felt to be a candidate for Core trial and was subsequently enrolled.  Last lipid clinic visit was 06/26/22 with Dr. Rennis Golden. He unfortunately was no longer enrolled in Core trial. He developed lifestyle limiting claudication and underwent left lower extremity bypass surgery. Triglycerides under 500 in December 2023. Lipid panel at time of visit revealed total cholesterol 151, triglycerides 803, and HDL 15.  His LDLs remain very low and given his history of  elevated liver enzymes on statins he had not been on statin therapy.  He was on an insulin pump with A1c over 10%.  Due to PAD, he was advised to restart rosuvastatin 5 mg daily.        History of Present Illness: .   Juan [name] is a 57 y.o. male who is here today for follow-up of hypertriglyceridemia. He reports his complex medical history including cancer at 34, hypertriglyceridemia since his teens, and pancreatitis. Was told at age 42 "not to worry about high triglycerides." He reports no recent symptoms of pancreatitis and believes his pancreas may have 'blown itself out.' He also mentions signs of pancreatic insufficiency. He reports no claudication and that he engages in regular exercise as much as possible given his limitations, mainly walking and some stairs. He reports no weightlifting due to lack of access to a gym and space at home, however he does have access to a gym at [place]. Regarding his diet, he admits it is currently not great due to a recent transition to being empty nesters and a lack of motivation to cook. He eats out a few times a week, which he acknowledges is usually not healthy. Dr. Myra Gianotti with [facility] would like to get him lympedema pumps but they were denied by insurance.  He request that we order them as well.  He would like to be considered for future will trials for medicines to treat his elevated triglycerides.  He denies concerning cardiac symptoms including chest pain, shortness of breath, orthopnea, PND, palpitations, presyncope, syncope.   Discussed the use of AI scribe software for clinical note transcription with  the patient, who gave verbal consent to proceed.   ROS: See HPI       Studies Reviewed: .         Risk Assessment/Calculations:             Physical Exam:   VS:  BP 130/88   Pulse 94   Ht 5\' 11"  (1.803 m)   Wt 232 lb 12.8 oz (105.6 kg)   SpO2 96%   BMI 32.47 kg/m    Wt Readings from Last 3 Encounters:  01/30/23 232 lb 12.8 oz (105.6 kg)   01/14/23 240 lb (108.9 kg)  07/09/22 239 lb (108.4 kg)    GEN: Well nourished, well developed in no acute distress RESPIRATORY:  No increased work of breathing  EXTREMITIES:  No edema; No deformity     ASSESSMENT AND PLAN: .    Dyslipidemia/Severe hypertriglyceridemia: Lipid panel 01/25/23: total cholesterol 137, triglycerides 1345, HDL 12, direct LDL 18. Asks about future trials. Per Dr. Blanchie Dessert note, he should be considered for APO-C3 trial.  He admits to dietary indiscretion recently since his daughter moved out to college. Encouraged low fat, mostly plant based diet avoiding processed foods and simple carbohydrates. Continue fenofibrate, Vascepa, and rosuvastatin.  Coronary and peripheral arterial disease: He is at goal for LDL but due to atherosclerosis and recent intervention for PAD, he is maintained on rosuvastatin 5 mg daily.  He is tolerating this well with no concerning side effects. Encouraged secondary prevention including heart healthy mostly plant based diet avoiding saturated fat, processed foods, simple carbohydrates, and sugar along with aiming for at least 150 minutes of moderate intensity exercise each week.   History of pancreatitis: No acute concerns today.  He says he is being evaluated for pancreatic insufficiency. Advised restricting saturated fat and simple carbohydrates in hopes of improving triglycerides.  Encouraged him to walk at least 10 minutes after each meal.   Lymphedema: He is seeking to get lymphedema pumps as recommended by vascular surgery.  He asks that we send an additional request to encourage insurance company to approve these.        Dispo: 6 months with Dr. Rennis Golden or me  Signed, Eligha Bridegroom, NP-C

## 2023-01-30 ENCOUNTER — Ambulatory Visit (HOSPITAL_BASED_OUTPATIENT_CLINIC_OR_DEPARTMENT_OTHER): Payer: Commercial Managed Care - PPO | Admitting: Nurse Practitioner

## 2023-01-30 ENCOUNTER — Encounter (HOSPITAL_BASED_OUTPATIENT_CLINIC_OR_DEPARTMENT_OTHER): Payer: Self-pay | Admitting: Nurse Practitioner

## 2023-01-30 ENCOUNTER — Other Ambulatory Visit: Payer: Self-pay

## 2023-01-30 VITALS — BP 130/88 | HR 94 | Ht 71.0 in | Wt 232.8 lb

## 2023-01-30 DIAGNOSIS — I890 Lymphedema, not elsewhere classified: Secondary | ICD-10-CM | POA: Diagnosis not present

## 2023-01-30 DIAGNOSIS — K861 Other chronic pancreatitis: Secondary | ICD-10-CM

## 2023-01-30 DIAGNOSIS — I739 Peripheral vascular disease, unspecified: Secondary | ICD-10-CM

## 2023-01-30 DIAGNOSIS — E783 Hyperchylomicronemia: Secondary | ICD-10-CM | POA: Diagnosis not present

## 2023-01-30 DIAGNOSIS — Z951 Presence of aortocoronary bypass graft: Secondary | ICD-10-CM

## 2023-01-30 DIAGNOSIS — I5022 Chronic systolic (congestive) heart failure: Secondary | ICD-10-CM | POA: Diagnosis not present

## 2023-01-30 NOTE — Patient Instructions (Signed)
Medication Instructions:   Your physician recommends that you continue on your current medications as directed. Please refer to the Current Medication list given to you today.   *If you need a refill on your cardiac medications before your next appointment, please call your pharmacy*   Lab Work:  Your physician recommends that you return for a FASTING NMR one week prior to your May appointment.    If you have labs (blood work) drawn today and your tests are completely normal, you will receive your results only by: MyChart Message (if you have MyChart) OR A paper copy in the mail If you have any lab test that is abnormal or we need to change your treatment, we will call you to review the results.   Testing/Procedures:  None ordered.   Follow-Up: At [facility], you and your health needs are our priority.  As part of our continuing mission to provide you with exceptional heart care, we have created designated Provider Care Teams.  These Care Teams include your primary Cardiologist (physician) and Advanced Practice Providers (APPs -  Physician Assistants and Nurse Practitioners) who all work together to provide you with the care you need, when you need it.  We recommend signing up for the patient portal called "MyChart".  Sign up information is provided on this After Visit Summary.  MyChart is used to connect with patients for Virtual Visits (Telemedicine).  Patients are able to view lab/test results, encounter notes, upcoming appointments, etc.  Non-urgent messages can be sent to your provider as well.   To learn more about what you can do with MyChart, go to [email or web address].    Your next appointment:   6 month(s)  Provider:   K. Italy Hilty, MD or Lebron Conners   Other Instructions  Your physician wants you to follow-up in: 6 months.  You will receive a reminder letter in the mail two months in advance. If you don't receive a letter, please call our office  to schedule the follow-up appointment.

## 2023-02-02 ENCOUNTER — Other Ambulatory Visit: Payer: Self-pay | Admitting: Internal Medicine

## 2023-02-11 ENCOUNTER — Ambulatory Visit (HOSPITAL_COMMUNITY)
Admission: RE | Admit: 2023-02-11 | Discharge: 2023-02-11 | Disposition: A | Discharge status: Home / Self Care | Payer: Commercial Managed Care - PPO | Source: Ambulatory Visit | Attending: Internal Medicine | Admitting: Internal Medicine

## 2023-02-11 ENCOUNTER — Encounter (HOSPITAL_COMMUNITY): Payer: Self-pay | Admitting: Internal Medicine

## 2023-02-11 VITALS — BP 100/58 | HR 82 | Wt 236.2 lb

## 2023-02-11 DIAGNOSIS — Z955 Presence of coronary angioplasty implant and graft: Secondary | ICD-10-CM | POA: Insufficient documentation

## 2023-02-11 DIAGNOSIS — E1165 Type 2 diabetes mellitus with hyperglycemia: Secondary | ICD-10-CM | POA: Diagnosis not present

## 2023-02-11 DIAGNOSIS — Z794 Long term (current) use of insulin: Secondary | ICD-10-CM | POA: Insufficient documentation

## 2023-02-11 DIAGNOSIS — I130 Hypertensive heart and chronic kidney disease with heart failure and stage 1 through stage 4 chronic kidney disease, or unspecified chronic kidney disease: Secondary | ICD-10-CM | POA: Diagnosis present

## 2023-02-11 DIAGNOSIS — I340 Nonrheumatic mitral (valve) insufficiency: Secondary | ICD-10-CM | POA: Diagnosis not present

## 2023-02-11 DIAGNOSIS — Z7902 Long term (current) use of antithrombotics/antiplatelets: Secondary | ICD-10-CM | POA: Insufficient documentation

## 2023-02-11 DIAGNOSIS — E1122 Type 2 diabetes mellitus with diabetic chronic kidney disease: Secondary | ICD-10-CM | POA: Diagnosis not present

## 2023-02-11 DIAGNOSIS — I5022 Chronic systolic (congestive) heart failure: Secondary | ICD-10-CM | POA: Insufficient documentation

## 2023-02-11 DIAGNOSIS — Z8719 Personal history of other diseases of the digestive system: Secondary | ICD-10-CM | POA: Insufficient documentation

## 2023-02-11 DIAGNOSIS — N1832 Chronic kidney disease, stage 3b: Secondary | ICD-10-CM | POA: Insufficient documentation

## 2023-02-11 DIAGNOSIS — Z951 Presence of aortocoronary bypass graft: Secondary | ICD-10-CM | POA: Insufficient documentation

## 2023-02-11 DIAGNOSIS — Z7985 Long-term (current) use of injectable non-insulin antidiabetic drugs: Secondary | ICD-10-CM | POA: Diagnosis not present

## 2023-02-11 DIAGNOSIS — Z9641 Presence of insulin pump (external) (internal): Secondary | ICD-10-CM | POA: Diagnosis not present

## 2023-02-11 DIAGNOSIS — E783 Hyperchylomicronemia: Secondary | ICD-10-CM | POA: Diagnosis not present

## 2023-02-11 DIAGNOSIS — I2510 Atherosclerotic heart disease of native coronary artery without angina pectoris: Secondary | ICD-10-CM | POA: Insufficient documentation

## 2023-02-11 DIAGNOSIS — E781 Pure hyperglyceridemia: Secondary | ICD-10-CM | POA: Insufficient documentation

## 2023-02-11 DIAGNOSIS — I5042 Chronic combined systolic (congestive) and diastolic (congestive) heart failure: Secondary | ICD-10-CM

## 2023-02-11 DIAGNOSIS — Z79899 Other long term (current) drug therapy: Secondary | ICD-10-CM | POA: Insufficient documentation

## 2023-02-11 NOTE — Progress Notes (Signed)
ADVANCED HF CLINIC NOTE  PCP: Emilio Aspen, MD Primary Cardiologist: Allyson Sabal HF: Dr. Gala Romney  HPI: Juan [name] is 57 y.o. male w/ h/o systolic HF, EF 45%, 3VCAD s/p CABG in 2014 followed by stenting to SVG-RCA in 2018, DM2, severe hypertriglyceridemia w/ subsequent episodes of pancreatitis and PAD s.p LLE bypass graft.  Seen by Dr. Allyson Sabal 11/21 for progressive dyspnea and wt gain which was being managed by increasing home diuretics w/o much improvement in volume or symptoms. Echo repeated showing stable LVEF at 45%. RV systolic function low normal. Mild-mod MR also noted. Given persistent symptoms, he had LHC that showed progression of his native and graft disease.  All vein grafts are functionally occluded with minimal left to right collaterals.  His LIMA is patent although his LAD is diffusely diseased.  RHC showed mPCWP mildly elevated at 19. LVEDP 23. CO by Fick 3.25, CI 1.48, by thermo calculation CO 7, CI 3.2.  Echo 4/22 EF 50-55% mild MR   Echo 05/22/21 showed EF 50%, mild LVH, RV mildly reduced, aortic root dilation 37 mm  Today he returns for HF follow up. I have not seen him since 6/23. He has been followed by Dr. Rennis Golden for hypertriglyceridemia. Last TGs were 1,345 on 01/25/23. Has not followed with Dr. Allyson Sabal. Following with Dr. Lisabeth Devoid (Endo) but DM uncontrolled. HGBa1c > 10. Has lower extremity edema on left due to venous damage. No edema on R. Can do all activities without problem but if he tries to "speed walk" he gets SOB. Compliant with heart meds.    ROS: All systems negative except as listed in HPI, PMH and Problem List.  SH:  Social History   Socioeconomic History   Marital status: Married    Spouse name: Research scientist (physical sciences)   Number of children: 1   Years of education: 16   Highest education level: Not on file  Occupational History   Occupation: Biochemist, clinical     Employer: [employer]  Tobacco Use   Smoking status: Never    Passive exposure:  Never   Smokeless tobacco: Never  Vaping Use   Vaping status: Never Used  Substance and Sexual Activity   Alcohol use: No   Drug use: No   Sexual activity: Yes  Other Topics Concern   Not on file  Social History Narrative   Adopted, so no pertinent FH.  Married.  Lives with wife in [place].  Independent of ADLs and ambulation.   Social Determinants of Health   Financial Resource Strain: Not on file  Food Insecurity: Not on file  Transportation Needs: Not on file  Physical Activity: Not on file  Stress: Not on file  Social Connections: Not on file  Intimate Partner Violence: Not on file   FH:  Family History  Adopted: Yes   Past Medical History:  Diagnosis Date   Abnormal cardiovascular stress test 02/06/2013   Anemia    occasional - no problems currently(10/02/2011)   Bilateral renal cysts 06/16/2011   states no known problems   Blood transfusion without reported diagnosis    CAD (coronary artery disease), LAD 90%, 1st diag 95%, LCX 70% wth AV groove 90%, RCA 40-50% mid and 80% long distal stenosis 02/03/13 02/04/2013   Chest pain    positive Myoview stress test   CHF (congestive heart failure) (HCC)    Coronary artery calcification seen on CAT scan    Diabetes mellitus    IDDM   Fatty liver disease, nonalcoholic 06/16/2011  Hyperlipidemia    Hypertension    states no dx. of HTN, takes med. to protect kidneys due to DM   Lateral meniscus tear 09/2011   left   Loose body in knee 09/2011   loose bodies left knee   Non Hodgkin's lymphoma (HCC) 1991   Pancreatitis    occasional - last episode 06/2011   Pneumonia    S/P CABG x 4, 02/05/13 LIMA-LAD; LT. RADIAL-OM;VG-DIAG; VG-PDA 02/06/2013   10/18 3/4 patent grafts (occluded SVG-->Diag), PCI/DESx 3 SVG-->RCA, normal EF   Sleep apnea    mild per patient   Splenomegaly, congestive, chronic    Stuffy and runny nose 10/02/2011   yellow drainage from nose   Current Outpatient Medications  Medication Sig Dispense  Refill   aspirin 81 MG chewable tablet Chew 1 tablet (81 mg total) by mouth daily.     clopidogrel (PLAVIX) 75 MG tablet Take 1 tablet (75 mg total) by mouth daily. 90 tablet 3   Continuous Blood Gluc Receiver (DEXCOM G7 RECEIVER) DEVI by Does not apply route.     fenofibrate 160 MG tablet Take 160 mg by mouth in the morning.     Glucose Blood (BAYER CONTOUR NEXT TEST VI) glucose testing     insulin regular human CONCENTRATED (HUMULIN R) 500 UNIT/ML injection Inject into the skin continuous. Pump setting 125 delivers up to 630 units (max daily dosage via pump)     isosorbide mononitrate (IMDUR) 30 MG 24 hr tablet TAKE 1 TABLET BY MOUTH EVERY DAY 90 tablet 3   metFORMIN (GLUCOPHAGE) 1000 MG tablet Take 1,000 mg by mouth 2 (two) times daily with a meal.     metoprolol succinate (TOPROL-XL) 25 MG 24 hr tablet TAKE 3 TABLETS BY MOUTH TWICE A DAY WITH OR IMMEDIATELY FOLLOWING A MEAL 540 tablet 3   nitroGLYCERIN (NITROSTAT) 0.4 MG SL tablet Place 1 tablet (0.4 mg total) under the tongue every 5 (five) minutes as needed. 25 tablet 2   Propylene Glycol (SYSTANE BALANCE) 0.6 % SOLN Place 1 drop into both eyes 4 (four) times daily as needed (dry eyes).     rosuvastatin (CRESTOR) 5 MG tablet Take 1 tablet (5 mg total) by mouth daily. 90 tablet 3   sacubitril-valsartan (ENTRESTO) 49-51 MG Take 1 tablet by mouth 2 (two) times daily. NEEDS FOLLOW UP APPOINTMENT FOR MORE REFILLS 60 tablet 6   Semaglutide (OZEMPIC, 0.25 OR 0.5 MG/DOSE, [place]) Inject 0.5 mg into the skin once a week.     spironolactone (ALDACTONE) 25 MG tablet TAKE 1 TABLET BY MOUTH DAILY. 90 tablet 2   torsemide (DEMADEX) 20 MG tablet TAKE 2 TABLETS 2 TIMES DAILY. NEEDS FOLLOW UP APPOINTMENT FOR MORE REFILLS 120 tablet 0   TRESIBA FLEXTOUCH 200 UNIT/ML FlexTouch Pen Inject 60 Units into the skin in the morning.     VASCEPA 1 g capsule TAKE 2 CAPSULES BY MOUTH TWICE A DAY 360 capsule 4   No current facility-administered medications for this  encounter.   BP (!) 100/58   Pulse 82   Wt 107.1 kg (236 lb 3.2 oz)   SpO2 95%   BMI 32.94 kg/m   Wt Readings from Last 3 Encounters:  02/11/23 107.1 kg (236 lb 3.2 oz)  01/30/23 105.6 kg (232 lb 12.8 oz)  01/14/23 108.9 kg (240 lb)   PHYSICAL EXAM: General:  Obese. No resp difficulty HEENT: normal Neck: supple. no JVD. Carotids 2+ bilat; no bruits. No lymphadenopathy or thryomegaly appreciated. Cor: PMI nondisplaced. Regular rate &  rhythm. No rubs, gallops or murmurs. Lungs: clear Abdomen: soft, nontender, nondistended. No hepatosplenomegaly. No bruits or masses. Good bowel sounds. Extremities: no cyanosis, clubbing, rash, LLE with 2-3+ edema. No edema on R. +chronic venous stasis  Neuro: alert & orientedx3, cranial nerves grossly intact. moves all 4 extremities w/o difficulty. Affect pleasant  ECG: SR 81 1AVB (214) RBBB  Personally reviewed  ASSESSMENT & PLAN:  1. Chronic Systolic Heart Failure due to iCM - Echo 2017 showed normal LVEF 60-65% - Echo 4/21, EF mildly reduced 45%. NST showed scar but no ischemia - Echo 11/21, EF 45%, RV systolic function low normal  - RHC 11/21 showed mildly elevated filling pressures, PCWP 19. LVEDP 23. CO by Fick 3.25, CI 1.48, by thermo calculation CO 7, CI 3.2.  - Echo 4/22 50-55%  - Echo 3/23, 05/22/21 EF 50%  - Stable NYHA II. Volume status ok  - Continue torsemide 40 mg bid. - Continue spiro 25 mg daily. - Continue Entresto 49/51 mg bid. - Continue Toprol 75 mg bid. - Continue compression stockings. - Failed Jardiance/Farxiga due to cellulitis - BP too low to titrate GDMT . - Due for repeat echo     2. CAD - s/p CABG in 2014, followed by PCI to SVG-RCA in 2018 - LHC 12/21 showed progression of native vessel CAD and grafts. All vein grafts are functionally occluded with minimal L->R collaterals.  His LIMA is patent although his LAD is diffusely diseased (diabetic). No targets for intervention and not a candidate for redo CABG.  - No  significant angina - Followed by Dr. Allyson Sabal. - Cont ASA, Plavix and low-dose statin   3. Mitral Regurgitation  - Very mild MR noted on TEE 11/21 after diuresis.  - Trivial on echo 3/23 - Due for repeat echo  4. IDDM, poorly controlled - Has insulin pump.  - Managed by Dr. Lisabeth Devoid - See SGLT2i discussion above.   5. Severe Hypertriglyceridemia  - Has been as high as 6,000 - Now follows with Dr. Rennis Golden with diagnosis of severe hyperchylomicronemia. His testing for FCS was genetically negative  6. PAD - s/p LLE bypass graft. Followed by [facility]. Improving  7. CKD IIIb - Scr ranges 1.3-1.8 - Follows with Dr. Rulon Eisenmenger, MD  8:57 AM

## 2023-02-11 NOTE — Patient Instructions (Signed)
Medication Changes:  No Changes In Medications at this time.   Testing/Procedures:  Your physician has requested that you have an echocardiogram. Echocardiography is a painless test that uses sound waves to create images of your heart. It provides your doctor with information about the size and shape of your heart and how well your heart's chambers and valves are working. This procedure takes approximately one hour. There are no restrictions for this procedure. Please do NOT wear cologne, perfume, aftershave, or lotions (deodorant is allowed). Please arrive 15 minutes prior to your appointment time.  Please note: We ask at that you not bring children with you during ultrasound (echo/ vascular) testing. Due to room size and safety concerns, children are not allowed in the ultrasound rooms during exams. Our front office staff cannot provide observation of children in our lobby area while testing is being conducted. An adult accompanying a patient to their appointment will only be allowed in the ultrasound room at the discretion of the ultrasound technician under special circumstances. We apologize for any inconvenience.  Follow-Up in: 12 months PLEASE CALL OUR OFFICE AROUND SEPTEMBER TO GET SCHEDULED FOR YOUR APPOINTMENT. PHONE NUMBER IS [phone] OPTION 2   At the [facility], you and your health needs are our priority. We have a designated team specialized in the treatment of Heart Failure. This Care Team includes your primary Heart Failure Specialized Cardiologist (physician), Advanced Practice Providers (APPs- Physician Assistants and Nurse Practitioners), and Pharmacist who all work together to provide you with the care you need, when you need it.   You may see any of the following providers on your designated Care Team at your next follow up:  Dr. Arvilla Meres Dr. Marca Ancona Dr. Dorthula Nettles Dr. Theresia Bough Tonye Becket, NP Robbie Lis, [place] [facility] [place], [place] Brynda Peon, NP [place] Lee, NP Karle Plumber, PharmD   Please be sure to bring in all your medications bottles to every appointment.   Need to Contact us:  If you have any questions or concerns before your next appointment please send [place] a message through [place] or call our office at [phone].    TO LEAVE A MESSAGE FOR THE NURSE SELECT OPTION 2, PLEASE LEAVE A MESSAGE INCLUDING: YOUR NAME DATE OF BIRTH CALL BACK NUMBER REASON FOR CALL**this is important as we prioritize the call backs  YOU WILL RECEIVE A CALL BACK THE SAME DAY AS LONG AS YOU CALL BEFORE 4:00 PM

## 2023-02-11 NOTE — Addendum Note (Signed)
Encounter addended by: Baird Cancer, RN on: 02/11/2023 9:11 AM  Actions taken: Clinical Note Signed, Visit diagnoses modified, Order list changed, Diagnosis association updated

## 2023-03-08 ENCOUNTER — Ambulatory Visit (HOSPITAL_COMMUNITY)
Admission: RE | Admit: 2023-03-08 | Discharge: 2023-03-08 | Disposition: A | Discharge status: Home / Self Care | Payer: Commercial Managed Care - PPO | Source: Ambulatory Visit | Attending: Internal Medicine | Admitting: Internal Medicine

## 2023-03-08 DIAGNOSIS — I110 Hypertensive heart disease with heart failure: Secondary | ICD-10-CM | POA: Insufficient documentation

## 2023-03-08 DIAGNOSIS — E119 Type 2 diabetes mellitus without complications: Secondary | ICD-10-CM | POA: Diagnosis not present

## 2023-03-08 DIAGNOSIS — E785 Hyperlipidemia, unspecified: Secondary | ICD-10-CM | POA: Diagnosis not present

## 2023-03-08 DIAGNOSIS — Z951 Presence of aortocoronary bypass graft: Secondary | ICD-10-CM | POA: Diagnosis not present

## 2023-03-08 DIAGNOSIS — I5042 Chronic combined systolic (congestive) and diastolic (congestive) heart failure: Secondary | ICD-10-CM | POA: Insufficient documentation

## 2023-03-08 LAB — ECHOCARDIOGRAM COMPLETE
Area-P 1/2: 6.07 cm2
Calc EF: 45.1 %
MV VTI: 1.87 cm2
S' Lateral: 4.1 cm
Single Plane A2C EF: 41.6 %
Single Plane A4C EF: 47 %

## 2023-03-08 MED ORDER — PERFLUTREN LIPID MICROSPHERE
1.0000 mL | INTRAVENOUS | Status: AC | PRN
  Administered 2023-03-08: 4 mL via INTRAVENOUS
  Filled 2023-03-08: qty 10

## 2023-04-04 ENCOUNTER — Other Ambulatory Visit: Payer: Self-pay | Admitting: Cardiovascular Disease

## 2023-04-24 ENCOUNTER — Other Ambulatory Visit: Payer: Self-pay | Admitting: Cardiovascular Disease

## 2023-06-22 ENCOUNTER — Other Ambulatory Visit (HOSPITAL_COMMUNITY): Payer: Self-pay | Admitting: Family Medicine

## 2023-06-22 DIAGNOSIS — I5022 Chronic systolic (congestive) heart failure: Secondary | ICD-10-CM

## 2023-07-09 ENCOUNTER — Other Ambulatory Visit (HOSPITAL_COMMUNITY): Payer: Self-pay | Admitting: Internal Medicine

## 2023-07-21 ENCOUNTER — Other Ambulatory Visit (HOSPITAL_COMMUNITY): Payer: Self-pay | Admitting: Internal Medicine

## 2023-07-21 DIAGNOSIS — I5022 Chronic systolic (congestive) heart failure: Secondary | ICD-10-CM

## 2023-08-04 ENCOUNTER — Other Ambulatory Visit (HOSPITAL_BASED_OUTPATIENT_CLINIC_OR_DEPARTMENT_OTHER): Payer: Self-pay | Admitting: Internal Medicine

## 2023-09-24 ENCOUNTER — Telehealth (HOSPITAL_COMMUNITY): Payer: Self-pay | Admitting: Internal Medicine

## 2023-09-24 ENCOUNTER — Other Ambulatory Visit (HOSPITAL_COMMUNITY): Payer: Self-pay | Admitting: Internal Medicine

## 2023-09-24 NOTE — Telephone Encounter (Signed)
 Torsemide ? (Medication that pt needs)  Pt called front office to schedule a follow up appointment with Dr. Cherrie - - patient called and stated that he needs refills before his appointment with the [facility] on this coming Friday, September 27, 2023.   Patient stated pharmacy would not refill his medication.   Please give this patient a call back, he is out of his medications and needs some to last until his upcoming appointment.   [phone]   Thank you :)

## 2023-09-24 NOTE — Telephone Encounter (Signed)
 Spoke w/pt, he states he was able to get a refill, he will keep appt as sch Fri 7/11

## 2023-09-26 NOTE — Progress Notes (Signed)
 ADVANCED HF CLINIC NOTE  PCP: Charlott Dorn LABOR, MD Primary Cardiologist: Court HF: Dr. Cherrie  HPI: Juan [name] is 58 y.o. male w/ h/o systolic HF, EF 45%, 3VCAD s/p CABG in 2014 followed by stenting to SVG-RCA in 2018, DM2, severe hypertriglyceridemia w/ subsequent episodes of pancreatitis and PAD s.p LLE bypass graft.  Seen by Dr. Court 11/21 for progressive dyspnea and wt gain which was being managed by increasing home diuretics w/o much improvement in volume or symptoms. Echo repeated showing stable LVEF at 45%. RV systolic function low normal. Mild-mod MR also noted. Given persistent symptoms, he had LHC that showed progression of his native and graft disease.  All vein grafts are functionally occluded with minimal left to right collaterals.  His LIMA is patent although his LAD is diffusely diseased.  RHC showed mPCWP mildly elevated at 19. LVEDP 23. CO by Fick 3.25, CI 1.48, by thermo calculation CO 7, CI 3.2.  Echo 4/22 EF 50-55% mild MR   Echo 05/22/21 showed EF 50%, mild LVH, RV mildly reduced, aortic root dilation 37 mm  Echo 12/24: EF 45-50%, G2DD, mildly reduced RV  Today he returns for HF follow up. Overall feeling fair. Breathing up and down, he is SOB walking up steps or walking further distances on flat ground.Chronic LEE, has been worse lately. Affects his QOL. Rare palpitations, not using CPAP. Denies palpitations, abnormal bleeding, CP, dizziness, or PND/Orthopnea. Appetite ok. Not weighing regularly. Taking all medications. Continues to work at [employer]   ROS: All systems negative except as listed in HPI, PMH and Problem List.  SH:  Social History   Socioeconomic History   Marital status: Married    Spouse name: Research scientist (physical sciences)   Number of children: 1   Years of education: 16   Highest education level: Not on file  Occupational History   Occupation: Biochemist, clinical     Employer: [employer]  Tobacco Use   Smoking status: Never    Passive  exposure: Never   Smokeless tobacco: Never  Vaping Use   Vaping status: Never Used  Substance and Sexual Activity   Alcohol  use: No   Drug use: No   Sexual activity: Yes  Other Topics Concern   Not on file  Social History Narrative   Adopted, so no pertinent FH.  Married.  Lives with wife in [place].  Independent of ADLs and ambulation.   Social Drivers of Corporate investment banker Strain: Not on file  Food Insecurity: Not on file  Transportation Needs: Not on file  Physical Activity: Not on file  Stress: Not on file  Social Connections: Not on file  Intimate Partner Violence: Not on file   FH:  Family History  Adopted: Yes   Past Medical History:  Diagnosis Date   Abnormal cardiovascular stress test 02/06/2013   Anemia    occasional - no problems currently(10/02/2011)   Bilateral renal cysts 06/16/2011   states no known problems   Blood transfusion without reported diagnosis    CAD (coronary artery disease), LAD 90%, 1st diag 95%, LCX 70% wth AV groove 90%, RCA 40-50% mid and 80% long distal stenosis 02/03/13 02/04/2013   Chest pain    positive Myoview  stress test   CHF (congestive heart failure) (HCC)    Coronary artery calcification seen on CAT scan    Diabetes mellitus    IDDM   Fatty liver disease, nonalcoholic 06/16/2011   Hyperlipidemia    Hypertension    states no  dx. of HTN, takes med. to protect kidneys due to DM   Lateral meniscus tear 09/2011   left   Loose body in knee 09/2011   loose bodies left knee   Non Hodgkin's lymphoma (HCC) 1991   Pancreatitis    occasional - last episode 06/2011   Pneumonia    S/P CABG x 4, 02/05/13 LIMA-LAD; LT. RADIAL-OM;VG-DIAG; VG-PDA 02/06/2013   10/18 3/4 patent grafts (occluded SVG-->Diag), PCI/DESx 3 SVG-->RCA, normal EF   Sleep apnea    mild per patient   Splenomegaly, congestive, chronic    Stuffy and runny nose 10/02/2011   yellow drainage from nose   Current Outpatient Medications  Medication Sig  Dispense Refill   aspirin  81 MG chewable tablet Chew 1 tablet (81 mg total) by mouth daily.     clopidogrel  (PLAVIX ) 75 MG tablet TAKE 1 TABLET BY MOUTH EVERY DAY 90 tablet 3   Continuous Blood Gluc Receiver (DEXCOM G7 RECEIVER) DEVI by Does not apply route.     fenofibrate  160 MG tablet Take 160 mg by mouth in the morning.     Glucose Blood (BAYER CONTOUR NEXT TEST VI) glucose testing     icosapent  Ethyl (VASCEPA ) 1 g capsule TAKE 2 CAPSULES BY MOUTH TWICE A DAY 360 capsule 4   Insulin  Degludec (TRESIBA [place]) Inject 65 Units into the skin in the morning.     insulin  regular human CONCENTRATED (HUMULIN  R) 500 UNIT/ML injection Inject into the skin continuous. Pump setting 125 delivers up to 630 units (max daily dosage via pump)     isosorbide  mononitrate (IMDUR ) 30 MG 24 hr tablet TAKE 1 TABLET BY MOUTH EVERY DAY 90 tablet 3   metFORMIN  (GLUCOPHAGE ) 1000 MG tablet Take 1,000 mg by mouth 2 (two) times daily with a meal.     metoprolol  succinate (TOPROL -XL) 25 MG 24 hr tablet TAKE 3 TABLETS BY MOUTH TWICE A DAY WITH OR IMMEDIATELY FOLLOWING A MEAL 540 tablet 3   nitroGLYCERIN  (NITROSTAT ) 0.4 MG SL tablet Place 1 tablet (0.4 mg total) under the tongue every 5 (five) minutes as needed. 25 tablet 2   Propylene Glycol (SYSTANE BALANCE) 0.6 % SOLN Place 1 drop into both eyes 4 (four) times daily as needed (dry eyes).     rosuvastatin  (CRESTOR ) 5 MG tablet TAKE 1 TABLET (5 MG TOTAL) BY MOUTH DAILY. 90 tablet 0   sacubitril -valsartan  (ENTRESTO ) 49-51 MG Take 1 tablet by mouth 2 (two) times daily. NEEDS FOLLOW UP APPOINTMENT FOR MORE REFILLS 60 tablet 6   Semaglutide (OZEMPIC, 0.25 OR 0.5 MG/DOSE, [place]) Inject 0.5 mg into the skin once a week.     spironolactone  (ALDACTONE ) 25 MG tablet TAKE 1 TABLET BY MOUTH EVERY DAY 90 tablet 2   torsemide  (DEMADEX ) 20 MG tablet TAKE 2 TABLETS 2 TIMES DAILY. NEEDS FOLLOW UP APPOINTMENT FOR MORE REFILLS 120 tablet 0   No current facility-administered medications for this  encounter.   BP (!) 140/80   Pulse 79   Wt 110.4 kg (243 lb 6.4 oz)   SpO2 96%   BMI 33.95 kg/m   Wt Readings from Last 3 Encounters:  09/27/23 110.4 kg (243 lb 6.4 oz)  02/11/23 107.1 kg (236 lb 3.2 oz)  01/30/23 105.6 kg (232 lb 12.8 oz)   PHYSICAL EXAM: General:  NAD. No resp difficulty, walked into clinic, chronically-ill appearing HEENT: Normal Neck: Supple. JVP 10 Cor: Regular rate & rhythm. No rubs, gallops or murmurs. Lungs: Clear Abdomen: Soft, obese, nontender, nondistended.  Extremities:  No cyanosis, clubbing, rash, 2-3+ BLE pre-tibial edema, venous stasis skin changes Neuro: Alert & oriented x 3, moves all 4 extremities w/o difficulty. Affect pleasant.  ReDs reading: 39 %, abnormal  ASSESSMENT & PLAN:  1. Chronic Systolic Heart Failure due to iCM - Echo 2017 showed normal LVEF 60-65% - Echo 4/21, EF mildly reduced 45%. NST showed scar but no ischemia - Echo 11/21, EF 45%, RV systolic function low normal  - RHC 11/21 showed mildly elevated filling pressures, PCWP 19. LVEDP 23. CO by Fick 3.25, CI 1.48, by thermo calculation CO 7, CI 3.2.  - Echo 4/22 50-55%  - Echo 3/23, 05/22/21 EF 50%  - Echo 12/24: EF 45-50% - Stable NYHA II-IIb. Volume up, weight up and REDs 39% - Take metolazone  2.5 mg + 40 KCL x 1 dose today. - Continue torsemide  40 mg bid. - Continue spiro 25 mg daily. - Continue Entresto  49/51 mg bid. - Continue Toprol  75 mg bid. - Continue compression stockings. - Failed Jardiance /Farxiga due to cellulitis - Labs today.  2. CAD - s/p CABG in 2014, followed by PCI to SVG-RCA in 2018 - LHC 12/21 showed progression of native vessel CAD and grafts. All vein grafts are functionally occluded with minimal L->R collaterals.  His LIMA is patent although his LAD is diffusely diseased (diabetic). No targets for intervention and not a candidate for redo CABG.  - No significant angina - Followed by Dr. Court. - Cont ASA +Plavix  - Unable to tolerate statins    3. Mitral Regurgitation  - Very mild MR noted on TEE 11/21 after diuresis.  - Trivial on echo 3/23 - Trivial on echo 12/24  4. IDDM, poorly controlled - Has insulin  pump.  - Managed by Dr. Lavon - No SGLT2i   5. Severe Hypertriglyceridemia  - Has been as high as 6,000 - Now follows with Dr. Mona.  - His testing for [facility] was genetically negative  6. PAD - s/p LLE bypass graft.  - Followed by [facility].  - Chronic lymphedema greatly affecting functional status and QOL - He needs lymphedema pumps, will order  7. CKD IIIb - Baseline SCr 1.8 - Follows with Dr. Macel  Follow up in 2 weeks with APP for fluid check and 6 months with Dr. Cherrie + echo.  Harlene CHRISTELLA Gainer, FNP  9:23 AM

## 2023-09-27 ENCOUNTER — Ambulatory Visit (HOSPITAL_COMMUNITY)
Admission: RE | Admit: 2023-09-27 | Discharge: 2023-09-27 | Disposition: A | Discharge status: Home / Self Care | Source: Ambulatory Visit | Attending: Family Medicine

## 2023-09-27 ENCOUNTER — Encounter (HOSPITAL_COMMUNITY): Payer: Self-pay

## 2023-09-27 ENCOUNTER — Ambulatory Visit (HOSPITAL_COMMUNITY): Payer: Self-pay | Admitting: Family Medicine

## 2023-09-27 VITALS — BP 140/80 | HR 79 | Wt 243.4 lb

## 2023-09-27 DIAGNOSIS — I130 Hypertensive heart and chronic kidney disease with heart failure and stage 1 through stage 4 chronic kidney disease, or unspecified chronic kidney disease: Secondary | ICD-10-CM | POA: Diagnosis not present

## 2023-09-27 DIAGNOSIS — Z794 Long term (current) use of insulin: Secondary | ICD-10-CM | POA: Insufficient documentation

## 2023-09-27 DIAGNOSIS — I340 Nonrheumatic mitral (valve) insufficiency: Secondary | ICD-10-CM | POA: Insufficient documentation

## 2023-09-27 DIAGNOSIS — E1122 Type 2 diabetes mellitus with diabetic chronic kidney disease: Secondary | ICD-10-CM | POA: Insufficient documentation

## 2023-09-27 DIAGNOSIS — Z9641 Presence of insulin pump (external) (internal): Secondary | ICD-10-CM | POA: Insufficient documentation

## 2023-09-27 DIAGNOSIS — N183 Chronic kidney disease, stage 3 unspecified: Secondary | ICD-10-CM

## 2023-09-27 DIAGNOSIS — E1151 Type 2 diabetes mellitus with diabetic peripheral angiopathy without gangrene: Secondary | ICD-10-CM | POA: Insufficient documentation

## 2023-09-27 DIAGNOSIS — Z7985 Long-term (current) use of injectable non-insulin antidiabetic drugs: Secondary | ICD-10-CM | POA: Diagnosis not present

## 2023-09-27 DIAGNOSIS — N1832 Chronic kidney disease, stage 3b: Secondary | ICD-10-CM | POA: Diagnosis not present

## 2023-09-27 DIAGNOSIS — I255 Ischemic cardiomyopathy: Secondary | ICD-10-CM | POA: Insufficient documentation

## 2023-09-27 DIAGNOSIS — Z7982 Long term (current) use of aspirin: Secondary | ICD-10-CM | POA: Diagnosis not present

## 2023-09-27 DIAGNOSIS — Z951 Presence of aortocoronary bypass graft: Secondary | ICD-10-CM | POA: Diagnosis not present

## 2023-09-27 DIAGNOSIS — I2510 Atherosclerotic heart disease of native coronary artery without angina pectoris: Secondary | ICD-10-CM | POA: Diagnosis not present

## 2023-09-27 DIAGNOSIS — I890 Lymphedema, not elsewhere classified: Secondary | ICD-10-CM | POA: Diagnosis not present

## 2023-09-27 DIAGNOSIS — E781 Pure hyperglyceridemia: Secondary | ICD-10-CM | POA: Diagnosis not present

## 2023-09-27 DIAGNOSIS — I5022 Chronic systolic (congestive) heart failure: Secondary | ICD-10-CM | POA: Insufficient documentation

## 2023-09-27 DIAGNOSIS — Z79899 Other long term (current) drug therapy: Secondary | ICD-10-CM | POA: Insufficient documentation

## 2023-09-27 DIAGNOSIS — Z955 Presence of coronary angioplasty implant and graft: Secondary | ICD-10-CM | POA: Diagnosis not present

## 2023-09-27 DIAGNOSIS — Z95820 Peripheral vascular angioplasty status with implants and grafts: Secondary | ICD-10-CM | POA: Diagnosis not present

## 2023-09-27 DIAGNOSIS — Z7902 Long term (current) use of antithrombotics/antiplatelets: Secondary | ICD-10-CM | POA: Insufficient documentation

## 2023-09-27 DIAGNOSIS — I739 Peripheral vascular disease, unspecified: Secondary | ICD-10-CM

## 2023-09-27 LAB — BASIC METABOLIC PANEL WITH GFR
Anion gap: 12 (ref 5–15)
BUN: 34 mg/dL — ABNORMAL HIGH (ref 6–20)
CO2: 22 mmol/L (ref 22–32)
Calcium: 9.2 mg/dL (ref 8.9–10.3)
Chloride: 99 mmol/L (ref 98–111)
Creatinine, Ser: 1.54 mg/dL — ABNORMAL HIGH (ref 0.61–1.24)
GFR, Estimated: 53 mL/min — ABNORMAL LOW (ref >60)
Glucose, Bld: 398 mg/dL — ABNORMAL HIGH (ref 70–99)
Potassium: 4.5 mmol/L (ref 3.5–5.1)
Sodium: 133 mmol/L — ABNORMAL LOW (ref 135–145)

## 2023-09-27 LAB — BRAIN NATRIURETIC PEPTIDE: B Natriuretic Peptide: 310.4 pg/mL — ABNORMAL HIGH (ref 0.0–100.0)

## 2023-09-27 MED ORDER — POTASSIUM CHLORIDE CRYS ER 20 MEQ PO TBCR: 40.0000 meq | EXTENDED_RELEASE_TABLET | Freq: Once | ORAL | 0 refills | Status: DC

## 2023-09-27 MED ORDER — METOLAZONE 2.5 MG PO TABS: 2.5000 mg | ORAL_TABLET | ORAL | 0 refills | Status: DC | PRN

## 2023-09-27 MED ORDER — NITROGLYCERIN 0.4 MG SL SUBL: 0.4000 mg | SUBLINGUAL_TABLET | SUBLINGUAL | 2 refills | Status: DC | PRN

## 2023-09-27 NOTE — Patient Instructions (Signed)
 TAKE Metolazone  2.5 mg and 40 mEq of potassium ( 2 Tab) TODAY ONLY.   Labs done today, your results will be available in MyChart, we will contact you for abnormal readings.   Your physician recommends that you schedule a follow-up appointment as scheduled   If you have any questions or concerns before your next appointment please send us  a message through [place] or call our office at [phone].    TO LEAVE A MESSAGE FOR THE NURSE SELECT OPTION 2, PLEASE LEAVE A MESSAGE INCLUDING: YOUR NAME DATE OF BIRTH CALL BACK NUMBER REASON FOR CALL**this is important as we prioritize the call backs  YOU WILL RECEIVE A CALL BACK THE SAME DAY AS LONG AS YOU CALL BEFORE 4:00 PM  At the [facility], you and your health needs are our priority. As part of our continuing mission to provide you with exceptional heart care, we have created designated Provider Care Teams. These Care Teams include your primary Cardiologist (physician) and Advanced Practice Providers (APPs- Physician Assistants and Nurse Practitioners) who all work together to provide you with the care you need, when you need it.   You may see any of the following providers on your designated Care Team at your next follow up: Dr Toribio Fuel Dr Ezra Shuck Dr. Ria Commander Dr. Morene Brownie Amy Lenetta, NP Caffie Shed, [place] [facility], [place] Beckey Coe, NP [place] Lee, NP Ellouise Class, NP Tinnie Redman, PharmD Jaun Bash, PharmD   Please be sure to bring in all your medications bottles to every appointment.    Thank you for choosing [facility]

## 2023-10-03 ENCOUNTER — Telehealth (HOSPITAL_COMMUNITY): Payer: Self-pay | Admitting: Pharmacy Technician

## 2023-10-03 ENCOUNTER — Other Ambulatory Visit (HOSPITAL_COMMUNITY): Payer: Self-pay

## 2023-10-03 NOTE — Telephone Encounter (Signed)
 Patient Advocate Encounter   Received notification from [employer] that prior authorization for Entresto  is required.   PA submitted on CoverMyMeds Key [id number] Status is pending   Will continue to follow.

## 2023-10-03 NOTE — Telephone Encounter (Signed)
 Advanced Heart Failure Patient Advocate Encounter  Prior Authorization for Entresto  has been approved.    PA# [id number] Effective dates: 10/03/23 through 10/02/24  Juan [name] [name] Pa, CPhT

## 2023-10-15 NOTE — Progress Notes (Signed)
 ADVANCED HF CLINIC NOTE  PCP: Charlott Dorn LABOR, MD Primary Cardiologist: Court HF: Dr. Cherrie  HPI: Juan [name] is 58 y.o. male w/ h/o systolic HF, EF 45%, 3VCAD s/p CABG in 2014 followed by stenting to SVG-RCA in 2018, DM2, severe hypertriglyceridemia w/ subsequent episodes of pancreatitis and PAD s.p LLE bypass graft.  Seen by Dr. Court 11/21 for progressive dyspnea and wt gain which was being managed by increasing home diuretics w/o much improvement in volume or symptoms. Echo repeated showing stable LVEF at 45%. RV systolic function low normal. Mild-mod MR also noted. Given persistent symptoms, he had LHC that showed progression of his native and graft disease.  All vein grafts are functionally occluded with minimal left to right collaterals.  His LIMA is patent although his LAD is diffusely diseased.  RHC showed mPCWP mildly elevated at 19. LVEDP 23. CO by Fick 3.25, CI 1.48, by thermo calculation CO 7, CI 3.2.  Echo 4/22 EF 50-55% mild MR   Echo 05/22/21 showed EF 50%, mild LVH, RV mildly reduced, aortic root dilation 37 mm  Echo 12/24: EF 45-50%, G2DD, mildly reduced RV  Today he returns for HF follow up. Overall feeling fair. Breathing up and down, he is SOB walking up steps or walking further distances on flat ground.Chronic LEE, has been worse lately. Affects his QOL. Rare palpitations, not using CPAP. Denies palpitations, abnormal bleeding, CP, dizziness, or PND/Orthopnea. Appetite ok. Not weighing regularly. Taking all medications. Continues to work at [employer]   ROS: All systems negative except as listed in HPI, PMH and Problem List.  SH:  Social History   Socioeconomic History   Marital status: Married    Spouse name: Research scientist (physical sciences)   Number of children: 1   Years of education: 16   Highest education level: Not on file  Occupational History   Occupation: Biochemist, clinical     Employer: [employer]  Tobacco Use   Smoking status: Never    Passive  exposure: Never   Smokeless tobacco: Never  Vaping Use   Vaping status: Never Used  Substance and Sexual Activity   Alcohol  use: No   Drug use: No   Sexual activity: Yes  Other Topics Concern   Not on file  Social History Narrative   Adopted, so no pertinent FH.  Married.  Lives with wife in [place].  Independent of ADLs and ambulation.   Social Drivers of Corporate investment banker Strain: Not on file  Food Insecurity: Not on file  Transportation Needs: Not on file  Physical Activity: Not on file  Stress: Not on file  Social Connections: Not on file  Intimate Partner Violence: Not on file   FH:  Family History  Adopted: Yes   Past Medical History:  Diagnosis Date   Abnormal cardiovascular stress test 02/06/2013   Anemia    occasional - no problems currently(10/02/2011)   Bilateral renal cysts 06/16/2011   states no known problems   Blood transfusion without reported diagnosis    CAD (coronary artery disease), LAD 90%, 1st diag 95%, LCX 70% wth AV groove 90%, RCA 40-50% mid and 80% long distal stenosis 02/03/13 02/04/2013   Chest pain    positive Myoview  stress test   CHF (congestive heart failure) (HCC)    Coronary artery calcification seen on CAT scan    Diabetes mellitus    IDDM   Fatty liver disease, nonalcoholic 06/16/2011   Hyperlipidemia    Hypertension    states no  dx. of HTN, takes med. to protect kidneys due to DM   Lateral meniscus tear 09/2011   left   Loose body in knee 09/2011   loose bodies left knee   Non Hodgkin's lymphoma (HCC) 1991   Pancreatitis    occasional - last episode 06/2011   Pneumonia    S/P CABG x 4, 02/05/13 LIMA-LAD; LT. RADIAL-OM;VG-DIAG; VG-PDA 02/06/2013   10/18 3/4 patent grafts (occluded SVG-->Diag), PCI/DESx 3 SVG-->RCA, normal EF   Sleep apnea    mild per patient   Splenomegaly, congestive, chronic    Stuffy and runny nose 10/02/2011   yellow drainage from nose   Current Outpatient Medications  Medication Sig  Dispense Refill   aspirin  81 MG chewable tablet Chew 1 tablet (81 mg total) by mouth daily.     clopidogrel  (PLAVIX ) 75 MG tablet TAKE 1 TABLET BY MOUTH EVERY DAY 90 tablet 3   Continuous Blood Gluc Receiver (DEXCOM G7 RECEIVER) DEVI by Does not apply route.     fenofibrate  160 MG tablet Take 160 mg by mouth in the morning.     Glucose Blood (BAYER CONTOUR NEXT TEST VI) glucose testing     icosapent  Ethyl (VASCEPA ) 1 g capsule TAKE 2 CAPSULES BY MOUTH TWICE A DAY 360 capsule 4   Insulin  Degludec (TRESIBA [place]) Inject 65 Units into the skin in the morning.     insulin  regular human CONCENTRATED (HUMULIN  R) 500 UNIT/ML injection Inject into the skin continuous. Pump setting 125 delivers up to 630 units (max daily dosage via pump)     isosorbide  mononitrate (IMDUR ) 30 MG 24 hr tablet TAKE 1 TABLET BY MOUTH EVERY DAY 90 tablet 3   metFORMIN  (GLUCOPHAGE ) 1000 MG tablet Take 1,000 mg by mouth 2 (two) times daily with a meal.     metolazone  (ZAROXOLYN ) 2.5 MG tablet Take 1 tablet (2.5 mg total) by mouth as needed. Take a directed by the heart failure clinic 4 tablet 0   metoprolol  succinate (TOPROL -XL) 25 MG 24 hr tablet TAKE 3 TABLETS BY MOUTH TWICE A DAY WITH OR IMMEDIATELY FOLLOWING A MEAL 540 tablet 3   nitroGLYCERIN  (NITROSTAT ) 0.4 MG SL tablet Place 1 tablet (0.4 mg total) under the tongue every 5 (five) minutes as needed. 25 tablet 2   potassium chloride  SA (KLOR-CON  M) 20 MEQ tablet Take 2 tablets (40 mEq total) by mouth once for 1 dose. 2 tablet 0   Propylene Glycol (SYSTANE BALANCE) 0.6 % SOLN Place 1 drop into both eyes 4 (four) times daily as needed (dry eyes).     rosuvastatin  (CRESTOR ) 5 MG tablet TAKE 1 TABLET (5 MG TOTAL) BY MOUTH DAILY. 90 tablet 0   sacubitril -valsartan  (ENTRESTO ) 49-51 MG Take 1 tablet by mouth 2 (two) times daily. NEEDS FOLLOW UP APPOINTMENT FOR MORE REFILLS 60 tablet 6   Semaglutide (OZEMPIC, 0.25 OR 0.5 MG/DOSE, [place]) Inject 0.5 mg into the skin once a week.      spironolactone  (ALDACTONE ) 25 MG tablet TAKE 1 TABLET BY MOUTH EVERY DAY 90 tablet 2   torsemide  (DEMADEX ) 20 MG tablet TAKE 2 TABLETS 2 TIMES DAILY. NEEDS FOLLOW UP APPOINTMENT FOR MORE REFILLS 120 tablet 0   No current facility-administered medications for this visit.   There were no vitals taken for this visit.  Wt Readings from Last 3 Encounters:  09/27/23 110.4 kg (243 lb 6.4 oz)  02/11/23 107.1 kg (236 lb 3.2 oz)  01/30/23 105.6 kg (232 lb 12.8 oz)   PHYSICAL EXAM: General:  NAD. No resp difficulty, walked into clinic, chronically-ill appearing HEENT: Normal Neck: Supple. JVP 10 Cor: Regular rate & rhythm. No rubs, gallops or murmurs. Lungs: Clear Abdomen: Soft, obese, nontender, nondistended.  Extremities: No cyanosis, clubbing, rash, 2-3+ BLE pre-tibial edema, venous stasis skin changes Neuro: Alert & oriented x 3, moves all 4 extremities w/o difficulty. Affect pleasant.  ReDs reading: 39 %, abnormal  ASSESSMENT & PLAN:  1. Chronic Systolic Heart Failure due to iCM - Echo 2017 showed normal LVEF 60-65% - Echo 4/21, EF mildly reduced 45%. NST showed scar but no ischemia - Echo 11/21, EF 45%, RV systolic function low normal  - RHC 11/21 showed mildly elevated filling pressures, PCWP 19. LVEDP 23. CO by Fick 3.25, CI 1.48, by thermo calculation CO 7, CI 3.2.  - Echo 4/22 50-55%  - Echo 3/23, 05/22/21 EF 50%  - Echo 12/24: EF 45-50% - Stable NYHA II-IIb. Volume up, weight up and REDs 39% - Take metolazone  2.5 mg + 40 KCL x 1 dose today. - Continue torsemide  40 mg bid. - Continue spiro 25 mg daily. - Continue Entresto  49/51 mg bid. - Continue Toprol  75 mg bid. - Continue compression stockings. - Failed Jardiance /Farxiga due to cellulitis - Labs today.  2. CAD - s/p CABG in 2014, followed by PCI to SVG-RCA in 2018 - LHC 12/21 showed progression of native vessel CAD and grafts. All vein grafts are functionally occluded with minimal L->R collaterals.  His LIMA is patent  although his LAD is diffusely diseased (diabetic). No targets for intervention and not a candidate for redo CABG.  - No significant angina - Followed by Dr. Court. - Cont ASA +Plavix  - Unable to tolerate statins   3. Mitral Regurgitation  - Very mild MR noted on TEE 11/21 after diuresis.  - Trivial on echo 3/23 - Trivial on echo 12/24  4. IDDM, poorly controlled - Has insulin  pump.  - Managed by Dr. Lavon - No SGLT2i   5. Severe Hypertriglyceridemia  - Has been as high as 6,000 - Now follows with Dr. Mona.  - His testing for [facility] was genetically negative  6. PAD - s/p LLE bypass graft.  - Followed by [facility].  - Chronic lymphedema greatly affecting functional status and QOL - He needs lymphedema pumps, will order  7. CKD IIIb - Baseline SCr 1.8 - Follows with Dr. Macel  Follow up in 2 weeks with APP for fluid check and 6 months with Dr. Cherrie + echo.  Harlene CHRISTELLA Gainer, FNP  4:22 PM

## 2023-10-17 ENCOUNTER — Telehealth (HOSPITAL_COMMUNITY): Payer: Self-pay

## 2023-10-17 NOTE — Telephone Encounter (Signed)
 Called and spoke to pt's wife Powell to confirm/remind patient of their appointment at the [facility] on 10/18/23.   Appointment:   [x] Confirmed  [] Left mess   [] No answer/No voice mail  [] VM Full/unable to leave message  [] Phone not in service  Patient reminded to bring all medications and/or complete list.  Confirmed patient has transportation. Gave directions, instructed to utilize valet parking.

## 2023-10-18 ENCOUNTER — Encounter (HOSPITAL_COMMUNITY): Payer: Self-pay

## 2023-10-18 ENCOUNTER — Ambulatory Visit (HOSPITAL_COMMUNITY)
Admission: RE | Admit: 2023-10-18 | Discharge: 2023-10-18 | Disposition: A | Discharge status: Home / Self Care | Source: Ambulatory Visit | Attending: Family Medicine | Admitting: Family Medicine

## 2023-10-18 ENCOUNTER — Ambulatory Visit (HOSPITAL_COMMUNITY): Payer: Self-pay | Admitting: Family Medicine

## 2023-10-18 ENCOUNTER — Other Ambulatory Visit (HOSPITAL_COMMUNITY): Payer: Self-pay | Admitting: Family Medicine

## 2023-10-18 VITALS — BP 118/70 | HR 75 | Wt 238.0 lb

## 2023-10-18 DIAGNOSIS — I739 Peripheral vascular disease, unspecified: Secondary | ICD-10-CM

## 2023-10-18 DIAGNOSIS — Z9641 Presence of insulin pump (external) (internal): Secondary | ICD-10-CM | POA: Insufficient documentation

## 2023-10-18 DIAGNOSIS — N1832 Chronic kidney disease, stage 3b: Secondary | ICD-10-CM | POA: Diagnosis not present

## 2023-10-18 DIAGNOSIS — Z7982 Long term (current) use of aspirin: Secondary | ICD-10-CM | POA: Diagnosis not present

## 2023-10-18 DIAGNOSIS — Z794 Long term (current) use of insulin: Secondary | ICD-10-CM | POA: Insufficient documentation

## 2023-10-18 DIAGNOSIS — Z79899 Other long term (current) drug therapy: Secondary | ICD-10-CM | POA: Insufficient documentation

## 2023-10-18 DIAGNOSIS — I2510 Atherosclerotic heart disease of native coronary artery without angina pectoris: Secondary | ICD-10-CM | POA: Diagnosis not present

## 2023-10-18 DIAGNOSIS — Z7902 Long term (current) use of antithrombotics/antiplatelets: Secondary | ICD-10-CM | POA: Insufficient documentation

## 2023-10-18 DIAGNOSIS — Z955 Presence of coronary angioplasty implant and graft: Secondary | ICD-10-CM | POA: Insufficient documentation

## 2023-10-18 DIAGNOSIS — I5022 Chronic systolic (congestive) heart failure: Secondary | ICD-10-CM

## 2023-10-18 DIAGNOSIS — Z7985 Long-term (current) use of injectable non-insulin antidiabetic drugs: Secondary | ICD-10-CM | POA: Diagnosis not present

## 2023-10-18 DIAGNOSIS — Z7984 Long term (current) use of oral hypoglycemic drugs: Secondary | ICD-10-CM | POA: Insufficient documentation

## 2023-10-18 DIAGNOSIS — M7989 Other specified soft tissue disorders: Secondary | ICD-10-CM | POA: Diagnosis not present

## 2023-10-18 DIAGNOSIS — I130 Hypertensive heart and chronic kidney disease with heart failure and stage 1 through stage 4 chronic kidney disease, or unspecified chronic kidney disease: Secondary | ICD-10-CM | POA: Insufficient documentation

## 2023-10-18 DIAGNOSIS — E781 Pure hyperglyceridemia: Secondary | ICD-10-CM | POA: Diagnosis not present

## 2023-10-18 DIAGNOSIS — I340 Nonrheumatic mitral (valve) insufficiency: Secondary | ICD-10-CM | POA: Insufficient documentation

## 2023-10-18 DIAGNOSIS — E1122 Type 2 diabetes mellitus with diabetic chronic kidney disease: Secondary | ICD-10-CM | POA: Insufficient documentation

## 2023-10-18 DIAGNOSIS — E1151 Type 2 diabetes mellitus with diabetic peripheral angiopathy without gangrene: Secondary | ICD-10-CM | POA: Diagnosis not present

## 2023-10-18 DIAGNOSIS — I890 Lymphedema, not elsewhere classified: Secondary | ICD-10-CM | POA: Diagnosis not present

## 2023-10-18 DIAGNOSIS — N183 Chronic kidney disease, stage 3 unspecified: Secondary | ICD-10-CM

## 2023-10-18 DIAGNOSIS — Z951 Presence of aortocoronary bypass graft: Secondary | ICD-10-CM | POA: Diagnosis not present

## 2023-10-18 LAB — BASIC METABOLIC PANEL WITH GFR
Anion gap: 8 (ref 5–15)
BUN: 29 mg/dL — ABNORMAL HIGH (ref 6–20)
CO2: 24 mmol/L (ref 22–32)
Calcium: 8.7 mg/dL — ABNORMAL LOW (ref 8.9–10.3)
Chloride: 103 mmol/L (ref 98–111)
Creatinine, Ser: 1.28 mg/dL — ABNORMAL HIGH (ref 0.61–1.24)
GFR, Estimated: >60 mL/min (ref >60)
Glucose, Bld: 186 mg/dL — ABNORMAL HIGH (ref 70–99)
Potassium: 4.4 mmol/L (ref 3.5–5.1)
Sodium: 135 mmol/L (ref 135–145)

## 2023-10-18 LAB — BRAIN NATRIURETIC PEPTIDE: B Natriuretic Peptide: 211.2 pg/mL — ABNORMAL HIGH (ref 0.0–100.0)

## 2023-10-18 MED ORDER — POTASSIUM CHLORIDE ER 10 MEQ PO TBCR: 20.0000 meq | EXTENDED_RELEASE_TABLET | Freq: Every day | ORAL | 3 refills | Status: DC

## 2023-10-18 MED ORDER — TORSEMIDE 40 MG PO TABS: 80.0000 mg | ORAL_TABLET | Freq: Two times a day (BID) | ORAL | 1 refills | Status: DC

## 2023-10-18 MED ORDER — TORSEMIDE 20 MG PO TABS: 80.0000 mg | ORAL_TABLET | Freq: Two times a day (BID) | ORAL | 3 refills | Status: DC

## 2023-10-18 NOTE — Addendum Note (Signed)
 Encounter addended by: Micael Sueanne LABOR, CMA on: 10/18/2023 3:35 PM  Actions taken: Pharmacy for encounter modified, Order list changed

## 2023-10-18 NOTE — Patient Instructions (Addendum)
 Good to see you today!  INCREASE torsemide  to 80 mg ( 2 tablets)Twice daily  Your physician has requested that you have an echocardiogram. Echocardiography is a painless test that uses sound waves to create images of your heart. It provides your doctor with information about the size and shape of your heart and how well your heart's chambers and valves are working. This procedure takes approximately one hour. There are no restrictions for this procedure. Please do NOT wear cologne, perfume, aftershave, or lotions (deodorant is allowed). Please arrive 15 minutes prior to your appointment time.  Please note: We ask at that you not bring children with you during ultrasound (echo/ vascular) testing. Due to room size and safety concerns, children are not allowed in the ultrasound rooms during exams. Our front office staff cannot provide observation of children in our lobby area while testing is being conducted. An adult accompanying a patient to their appointment will only be allowed in the ultrasound room at the discretion of the ultrasound technician under special circumstances. We apologize for any inconvenience.  Labs done today, your results will be available in MyChart, we will contact you for abnormal readings.  Repeat lab work in 10 -14 days  You have been referred to lymphedema  clinic they will call to schedule an appointment  Your physician has requested that you have an ankle brachial index (ABI). During this test an ultrasound and blood pressure cuff are used to evaluate the arteries that supply the arms and legs with blood. Allow thirty minutes for this exam. There are no restrictions or special instructions.  Please note: We ask at that you not bring children with you during ultrasound (echo/ vascular) testing. Due to room size and safety concerns, children are not allowed in the ultrasound rooms during exams. Our front office staff cannot provide observation of children in our lobby area  while testing is being conducted. An adult accompanying a patient to their appointment will only be allowed in the ultrasound room at the discretion of the ultrasound technician under special circumstances. We apologize for any inconvenience.  Your physician recommends that you schedule a follow-up appointment 6 months with echocardiogram(February ) Call office in December to schedule an appointment  If you have any questions or concerns before your next appointment please send us  a message through [facility] or call our office at [phone].    TO LEAVE A MESSAGE FOR THE NURSE SELECT OPTION 2, PLEASE LEAVE A MESSAGE INCLUDING: YOUR NAME DATE OF BIRTH CALL BACK NUMBER REASON FOR CALL**this is important as we prioritize the call backs  YOU WILL RECEIVE A CALL BACK THE SAME DAY AS LONG AS YOU CALL BEFORE 4:00 PM At the [facility], you and your health needs are our priority. As part of our continuing mission to provide you with exceptional heart care, we have created designated Provider Care Teams. These Care Teams include your primary Cardiologist (physician) and Advanced Practice Providers (APPs- Physician Assistants and Nurse Practitioners) who all work together to provide you with the care you need, when you need it.   You may see any of the following providers on your designated Care Team at your next follow up: Dr Toribio Fuel Dr Ezra Shuck Dr. Ria Commander Dr. Morene Brownie Amy Lenetta, NP Caffie Shed, [place] [facility] [place], [place] Beckey Coe, NP [place] Lee, NP Ellouise Class, NP Tinnie Redman, PharmD Jaun Bash, PharmD   Please be sure to bring in all your medications bottles to  every appointment.    Thank you for choosing [facility]

## 2023-10-18 NOTE — Progress Notes (Signed)
 ReDS Vest / Clip - 10/18/23 0900       ReDS Vest / Clip   Station Marker D    Ruler Value 33    ReDS Value Range Moderate volume overload    ReDS Actual Value 37

## 2023-10-25 ENCOUNTER — Ambulatory Visit (HOSPITAL_COMMUNITY)
Admission: RE | Admit: 2023-10-25 | Discharge: 2023-10-25 | Disposition: A | Discharge status: Home / Self Care | Source: Ambulatory Visit | Attending: Family Medicine | Admitting: Family Medicine

## 2023-10-25 DIAGNOSIS — I5022 Chronic systolic (congestive) heart failure: Secondary | ICD-10-CM | POA: Diagnosis present

## 2023-10-25 DIAGNOSIS — I739 Peripheral vascular disease, unspecified: Secondary | ICD-10-CM | POA: Insufficient documentation

## 2023-10-26 LAB — VAS US ABI WITH/WO TBI
Left ABI: 1.15
Right ABI: 1.15

## 2023-10-28 NOTE — Telephone Encounter (Signed)
 Called patient per Harlene Gainer, NP with following US  results:  ABIs stable, TBIs abnormal. He is past due for follow up with [facility] (sees Dr. Army), please arrange   Pt reports he was told at last visit with Dr. Army that he didn't need to come back.  Referral sent to Dr. Serene per Harlene Gainer, NP asking for f/u appointment for this patient. Informed patient his office should reach out to him to schedule, but if he doesn't hear from them to call Dr. Felizardo office. Pt verbalized understanding of same and has that contact information.

## 2023-10-31 ENCOUNTER — Encounter: Payer: Self-pay | Admitting: Physician Assistant

## 2023-10-31 ENCOUNTER — Ambulatory Visit: Attending: Vascular Surgery | Admitting: Physician Assistant

## 2023-10-31 VITALS — BP 141/80 | HR 76 | Temp 98.0°F | Resp 18 | Ht 71.0 in | Wt 225.4 lb

## 2023-10-31 DIAGNOSIS — I890 Lymphedema, not elsewhere classified: Secondary | ICD-10-CM

## 2023-10-31 DIAGNOSIS — I739 Peripheral vascular disease, unspecified: Secondary | ICD-10-CM | POA: Diagnosis not present

## 2023-10-31 NOTE — Progress Notes (Signed)
 HISTORY AND PHYSICAL     CC:  follow up. Requesting Provider:  Glena Harlene HERO, FNP  HPI: This is a 58 y.o. male who is here today for follow up for PAD.  Pt has hx of  left distal superficial femoral artery to tibioperoneal trunk bypass graft with ipsilateral nonreversed saphenous vein on 07/28/21 by Dr. Serene for severe left leg claudication.   Pt was last seen 01/14/2023 and at that time, his claudication had resolved. He was having some BLE swelling and left foot was worse since surgery.  It was felt it to be secondary to a lymphatic disruption as he did not have evidence of May Thurner Syndrome on CT scan and u/s was unremarkable.  He has hx of CHF and CKD.  He was referred to the lymphedema clinic as it was felt he would benefit from lymphedema pumps.    The pt returns today for follow up.  He states that he was seen by cardiology and they were recommending unna boot to the LLE for swelling.  ABI was ordered prior to placing these and the TBI was abnormal and he was referred for this.  His ABI is normal.  He denies any rest pain or non healing wounds.  He does not have claudication.  He does have lymphedema in the LLE and he states this has not really changed.  He states that he has a more sedentary job now.  The pt is not on a statin for cholesterol management.  Elevated liver enzymes The pt is on an aspirin.    Other AC:  Plavix The pt is on BB, ARB, diuretic for hypertension.  The pt is  on diabetic medication. Tobacco hx:  never  Pt does not have family hx of AAA.  Past Medical History:  Diagnosis Date   Abnormal cardiovascular stress test 02/06/2013   Anemia    occasional - no problems currently(10/02/2011)   Bilateral renal cysts 06/16/2011   states no known problems   Blood transfusion without reported diagnosis    CAD (coronary artery disease), LAD 90%, 1st diag 95%, LCX 70% wth AV groove 90%, RCA 40-50% mid and 80% long distal stenosis 02/03/13 02/04/2013   Chest  pain    positive Myoview stress test   CHF (congestive heart failure) (HCC)    Coronary artery calcification seen on CAT scan    Diabetes mellitus    IDDM   Fatty liver disease, nonalcoholic 06/16/2011   Hyperlipidemia    Hypertension    states no dx. of HTN, takes med. to protect kidneys due to DM   Lateral meniscus tear 09/2011   left   Loose body in knee 09/2011   loose bodies left knee   Non Hodgkin's lymphoma (HCC) 1991   Pancreatitis    occasional - last episode 06/2011   Pneumonia    S/P CABG x 4, 02/05/13 LIMA-LAD; LT. RADIAL-OM;VG-DIAG; VG-PDA 02/06/2013   10/18 3/4 patent grafts (occluded SVG-->Diag), PCI/DESx 3 SVG-->RCA, normal EF   Sleep apnea    mild per patient   Splenomegaly, congestive, chronic    Stuffy and runny nose 10/02/2011   yellow drainage from nose    Past Surgical History:  Procedure Laterality Date   ABDOMINAL AORTIC ANEURYSM REPAIR     ABDOMINAL AORTOGRAM W/LOWER EXTREMITY N/A 07/18/2021   Procedure: ABDOMINAL AORTOGRAM W/LOWER EXTREMITY;  Surgeon: Serene Gaile ORN, MD;  Location: [facility];  Service: Cardiovascular;  Laterality: N/A;   ANTERIOR CERVICAL DECOMP/DISCECTOMY FUSION N/A  03/26/2018   Procedure: ANTERIOR CERVICAL DECOMPRESSION FUSION CERVICAL 5-6 WITH INSTRUMENTATION AND ALLOGRAFT;  Surgeon: Beuford Anes, MD;  Location: [facility] OR;  Service: Orthopedics;  Laterality: N/A;   CARDIAC CATHETERIZATION     CORONARY ARTERY BYPASS GRAFT N/A 02/05/2013   Procedure: CORONARY ARTERY BYPASS GRAFTING (CABG);  Surgeon: Maude Fleeta Ochoa, MD;  Location: [facility];  Service: Open Heart Surgery;  Laterality: N/A;  Coronary artery bypass graft on pump times four using left internal mammary artery and right greater saphenous vein via endovein harvest and left radial artery harvest.    CORONARY STENT INTERVENTION  12/17/2016    PCI and drug-eluting stenting of the mid and distal RCA SVG    CORONARY STENT INTERVENTION N/A 12/17/2016   Procedure: CORONARY  STENT INTERVENTION;  Surgeon: Court Dorn PARAS, MD;  Location: [facility];  Service: Cardiovascular;  Laterality: N/A;   ELBOW SURGERY     FEMORAL-POPLITEAL BYPASS GRAFT Left 07/28/2021   Procedure: LEFT FEMORAL-BELOW KNEE POPLITEAL ARTERY BYPASS GRAFT;  Surgeon: Serene Gaile ORN, MD;  Location: [facility] OR;  Service: Vascular;  Laterality: Left;   HERNIA REPAIR     inguinal    herniated disc     INTRAOPERATIVE TRANSESOPHAGEAL ECHOCARDIOGRAM N/A 02/05/2013   Procedure: INTRAOPERATIVE TRANSESOPHAGEAL ECHOCARDIOGRAM;  Surgeon: Maude Fleeta Ochoa, MD;  Location[facility] OR;  Service: Open Heart Surgery;  Laterality: N/A;   KNEE ARTHROSCOPY  10/09/2011   Procedure: ARTHROSCOPY KNEE;  Surgeon: Eva Elsie Herring, MD;  Location: [facility];  Service: Orthopedics;  Laterality: Left;   LEFT HEART CATH AND CORS/GRAFTS ANGIOGRAPHY N/A 12/17/2016   Procedure: LEFT HEART CATH AND CORS/GRAFTS ANGIOGRAPHY;  Surgeon: Court Dorn PARAS, MD;  [facility]: [facility];  Service: Cardiovascular;  Laterality: N/A;   LEFT HEART CATHETERIZATION WITH CORONARY ANGIOGRAM N/A 02/03/2013   Procedure: LEFT HEART CATHETERIZATION WITH CORONARY ANGIOGRAM;  Surgeon: Dorn PARAS Court, MD;  Location[facility] C[facility] LAB;  Service: Cardiovascular;  Laterality: N/A;   NASAL SEPTUM SURGERY     PARATHYROIDECTOMY Right 11/23/2021   Procedure: RIGHT INFERIOR PARATHYROIDECTOMY;  Surgeon: Eletha Boas, MD;  Location: [facility] ORS;  Service: General;  Laterality: Right;   RADIAL ARTERY HARVEST Left 02/05/2013   Procedure: RADIAL ARTERY HARVEST;  Surgeon: Maude Fleeta Ochoa, MD;  Location: [facility] OR;  Service: Vascular;  Laterality: Left;   RIGHT/LEFT HEART CATH AND CORONARY/GRAFT ANGIOGRAPHY N/A 02/15/2020   Procedure: RIGHT/LEFT HEART CATH AND CORONARY/GRAFT ANGIOGRAPHY;  Surgeon: Court Dorn PARAS, MD;  Location: [facility];  Service: Cardiovascular;  Laterality: N/A;   TEE WITHOUT CARDIOVERSION N/A 02/18/2020   Procedure:  TRANSESOPHAGEAL ECHOCARDIOGRAM (TEE);  Surgeon: Cherrie Toribio SAUNDERS, MD;  Location: [facility] ENDOSCOPY;  Service: Cardiovascular;  Laterality: N/A;   TIBIA BONE BIOPSY  x 3   left   TONSILLECTOMY     VEIN HARVEST Left 07/28/2021   Procedure: HARVEST OF GREAT SAPHENOUS VEIN;  Surgeon: Serene Gaile ORN, MD;  Location: [facility] OR;  Service: Vascular;  Laterality: Left;    Allergies  Allergen Reactions   Empagliflozin Other (See Comments)    Caused skin infection   Reglan [Metoclopramide] Other (See Comments)    Hyperactivity with higher doses (can tolerate lower doses)    Sglt2 Inhibitors Other (See Comments)    Cellulitis with Jardiance and Farxiga    Current Outpatient Medications  Medication Sig Dispense Refill   aspirin 81 MG chewable tablet Chew 1 tablet (81 mg total) by mouth daily.     clopidogrel (PLAVIX)  75 MG tablet TAKE 1 TABLET BY MOUTH EVERY DAY 90 tablet 3   Continuous Blood Gluc Receiver (DEXCOM G7 RECEIVER) DEVI by Does not apply route.     fenofibrate  160 MG tablet Take 160 mg by mouth in the morning.     Glucose Blood (BAYER CONTOUR NEXT TEST VI) glucose testing     icosapent  Ethyl (VASCEPA ) 1 g capsule TAKE 2 CAPSULES BY MOUTH TWICE A DAY 360 capsule 4   Insulin  Degludec (TRESIBA [place]) Inject 65 Units into the skin in the morning.     insulin  regular human CONCENTRATED (HUMULIN  R) 500 UNIT/ML injection Inject into the skin continuous. Pump setting 125 delivers up to 630 units (max daily dosage via pump)     isosorbide  mononitrate (IMDUR ) 30 MG 24 hr tablet TAKE 1 TABLET BY MOUTH EVERY DAY 90 tablet 3   metFORMIN  (GLUCOPHAGE ) 1000 MG tablet Take 1,000 mg by mouth 2 (two) times daily with a meal.     metolazone  (ZAROXOLYN ) 2.5 MG tablet Take 1 tablet (2.5 mg total) by mouth as needed. Take a directed by the heart failure clinic 4 tablet 0   metoprolol  succinate (TOPROL -XL) 25 MG 24 hr tablet TAKE 3 TABLETS BY MOUTH TWICE A DAY WITH OR IMMEDIATELY FOLLOWING A MEAL 540 tablet 3    nitroGLYCERIN  (NITROSTAT ) 0.4 MG SL tablet Place 1 tablet (0.4 mg total) under the tongue every 5 (five) minutes as needed. 25 tablet 2   potassium chloride  (KLOR-CON ) 10 MEQ tablet Take 2 tablets (20 mEq total) by mouth daily. 60 tablet 3   Propylene Glycol (SYSTANE BALANCE) 0.6 % SOLN Place 1 drop into both eyes 4 (four) times daily as needed (dry eyes).     rosuvastatin  (CRESTOR ) 5 MG tablet TAKE 1 TABLET (5 MG TOTAL) BY MOUTH DAILY. 90 tablet 0   sacubitril -valsartan  (ENTRESTO ) 49-51 MG Take 1 tablet by mouth 2 (two) times daily. NEEDS FOLLOW UP APPOINTMENT FOR MORE REFILLS 60 tablet 6   Semaglutide (OZEMPIC, 0.25 OR 0.5 MG/DOSE, [place]) Inject 0.5 mg into the skin once a week.     spironolactone  (ALDACTONE ) 25 MG tablet TAKE 1 TABLET BY MOUTH EVERY DAY 90 tablet 2   torsemide  (DEMADEX ) 20 MG tablet Take 4 tablets (80 mg total) by mouth 2 (two) times daily. 180 tablet 3   No current facility-administered medications for this visit.    Family History  Adopted: Yes    Social History   Socioeconomic History   Marital status: Married    Spouse name: Research scientist (physical sciences)   Number of children: 1   Years of education: 16   Highest education level: Not on file  Occupational History   Occupation: Biochemist, clinical     Employer: [employer]  Tobacco Use   Smoking status: Never    Passive exposure: Never   Smokeless tobacco: Never  Vaping Use   Vaping status: Never Used  Substance and Sexual Activity   Alcohol  use: No   Drug use: No   Sexual activity: Yes  Other Topics Concern   Not on file  Social History Narrative   Adopted, so no pertinent FH.  Married.  Lives with wife in [place].  Independent of ADLs and ambulation.   Social Drivers of Corporate investment banker Strain: Not on file  Food Insecurity: Not on file  Transportation Needs: Not on file  Physical Activity: Not on file  Stress: Not on file  Social Connections: Not on file  Intimate Partner Violence: Not  on  file     REVIEW OF SYSTEMS:   [X]  denotes positive finding, [ ]  denotes negative finding Cardiac  Comments:  Chest pain or chest pressure:    Shortness of breath upon exertion:    Short of breath when lying flat:    Irregular heart rhythm:        Vascular    Pain in calf, thigh, or hip brought on by ambulation:    Pain in feet at night that wakes you up from your sleep:     Blood clot in your veins:    Leg swelling:         Pulmonary    Oxygen at home:    Productive cough:     Wheezing:         Neurologic    Sudden weakness in arms or legs:     Sudden numbness in arms or legs:     Sudden onset of difficulty speaking or slurred speech:    Temporary loss of vision in one eye:     Problems with dizziness:         Gastrointestinal    Blood in stool:     Vomited blood:         Genitourinary    Burning when urinating:     Blood in urine:        Psychiatric    Major depression:         Hematologic    Bleeding problems:    Problems with blood clotting too easily:        Skin    Rashes or ulcers:        Constitutional    Fever or chills:      PHYSICAL EXAMINATION:  Today's Vitals   10/31/23 1436  BP: (!) 141/80  Pulse: 76  Resp: 18  Temp: 98 F (36.7 C)  TempSrc: Temporal  Weight: 225 lb 6.4 oz (102.2 kg)  Height: 5' 11 (1.803 m)  PainSc: 0-No pain   Body mass index is 31.44 kg/m.   General:  WDWN in NAD; vital signs documented above Gait: Not observed HENT: WNL, normocephalic Pulmonary: normal non-labored breathing , without wheezing Cardiac: regular HR, without carotid bruits Abdomen: soft, NT; aortic pulse is not palpable Skin: without rashes Vascular Exam/Pulses:  Right Left  Radial 2+ (normal) 2+ (normal)  DP Biphasic doppler Biphasic doppler  PT Biphasic doppler Biphasic doppler   Extremities: without ischemic changes, without Gangrene , without cellulitis; without open wounds; swelling/lymphedema LLE Musculoskeletal: no muscle  wasting or atrophy  Neurologic: A&O X 3 Psychiatric:  The pt has Normal affect.   Non-Invasive Vascular Imaging:   ABI's/TBI's on 10/26/2023: Right:  1.15/0.50 - Great toe pressure: 58 Left:  1.15/0.61 - Great toe pressure: 71   Previous ABI's/TBI's on 01/14/2023: Right:  1.11/0.86 - Great toe pressure: 98 Left:  1.07/0.88 - Great toe pressure:  100  Previous arterial duplex on 01/14/2023: Left Graft #1: Left femoral-below knee popliteal artery bypass  +--------------------+--------+---------------+---------+--------+                     PSV cm/sStenosis       Waveform Comments  +--------------------+--------+---------------+---------+--------+  Inflow             182     30-49% stenosistriphasic          +--------------------+--------+---------------+---------+--------+  Proximal Anastomosis82  biphasic           +--------------------+--------+---------------+---------+--------+  Proximal Graft      86                     triphasic          +--------------------+--------+---------------+---------+--------+  Mid Graft           104                    triphasic          +--------------------+--------+---------------+---------+--------+  Distal Graft        93                     biphasic           +--------------------+--------+---------------+---------+--------+  Distal Anastomosis  133                    biphasic           +--------------------+--------+---------------+---------+--------+  Outflow            70                     biphasic           +--------------------+--------+---------------+---------+--------+   Summary:  Left: Patent femoral-popliteal artery bypass graft with inflow artery.  Inflow artery velocity is suggestive of 30-49% stenosis without evidence  of significant plaque formation     ASSESSMENT/PLAN:: 58 y.o. male here for follow up for PAD with hx of left distal superficial femoral  artery to tibioperoneal trunk bypass graft with ipsilateral nonreversed saphenous vein on 07/28/21 by Dr. Serene for severe left leg claudication.    -pt with normal ABI.  TBI somewhat depressed but ok for unna boot per cardiology.  -discussed trying to stay as active as possible and mobilize every hour while at work  -continue asa/statin/plavix  -discussed importance of increased walking daily -pt will f/u at the beginning of the year with ABI and LLE arterial duplex to evaluate bypass graft.  He knows to call sooner if he develops non healing wounds or rest pain.  He prefers to come on Friday as this is his day off.    Lucie Apt, [facility] [phone]  Clinic MD:   [place]

## 2023-11-01 ENCOUNTER — Ambulatory Visit (HOSPITAL_COMMUNITY)
Admission: RE | Admit: 2023-11-01 | Discharge: 2023-11-01 | Disposition: A | Discharge status: Home / Self Care | Source: Ambulatory Visit | Attending: Cardiology | Admitting: Cardiology

## 2023-11-01 ENCOUNTER — Telehealth (HOSPITAL_COMMUNITY): Payer: Self-pay | Admitting: *Deleted

## 2023-11-01 ENCOUNTER — Other Ambulatory Visit: Payer: Self-pay

## 2023-11-01 DIAGNOSIS — E875 Hyperkalemia: Secondary | ICD-10-CM | POA: Diagnosis not present

## 2023-11-01 DIAGNOSIS — E1010 Type 1 diabetes mellitus with ketoacidosis without coma: Secondary | ICD-10-CM | POA: Diagnosis not present

## 2023-11-01 DIAGNOSIS — I5022 Chronic systolic (congestive) heart failure: Secondary | ICD-10-CM

## 2023-11-01 DIAGNOSIS — I739 Peripheral vascular disease, unspecified: Secondary | ICD-10-CM

## 2023-11-01 LAB — BASIC METABOLIC PANEL WITH GFR
Anion gap: 15 (ref 5–15)
BUN: 55 mg/dL — ABNORMAL HIGH (ref 6–20)
CO2: 19 mmol/L — ABNORMAL LOW (ref 22–32)
Calcium: 8.8 mg/dL — ABNORMAL LOW (ref 8.9–10.3)
Chloride: 93 mmol/L — ABNORMAL LOW (ref 98–111)
Creatinine, Ser: 2.05 mg/dL — ABNORMAL HIGH (ref 0.61–1.24)
GFR, Estimated: 37 mL/min — ABNORMAL LOW (ref >60)
Glucose, Bld: 658 mg/dL (ref 70–99)
Potassium: 6.6 mmol/L (ref 3.5–5.1)
Sodium: 127 mmol/L — ABNORMAL LOW (ref 135–145)

## 2023-11-01 NOTE — Telephone Encounter (Signed)
 Received call from lab, K 6.6 with hemolysis  Per [place] Lee, NP have pt stop KCL and repeat labs Monday.   Spoke w/pt, he will stop KCL but refused to come in for labs until [facility] 8/21 as he states he can not miss work, labs sch for 8/21

## 2023-11-01 NOTE — Addendum Note (Signed)
 Encounter addended by: Dante Jeannine HERO, CMA on: 11/01/2023 2:27 PM  Actions taken: Order list changed, Diagnosis association updated

## 2023-11-04 ENCOUNTER — Inpatient Hospital Stay (HOSPITAL_COMMUNITY)
Admission: EM | Admit: 2023-11-04 | Discharge: 2023-11-07 | DRG: 638 | Disposition: A | Discharge status: Home / Self Care | Attending: Internal Medicine | Admitting: Internal Medicine

## 2023-11-04 ENCOUNTER — Other Ambulatory Visit: Payer: Self-pay

## 2023-11-04 ENCOUNTER — Encounter (HOSPITAL_COMMUNITY): Payer: Self-pay

## 2023-11-04 ENCOUNTER — Emergency Department (HOSPITAL_COMMUNITY)

## 2023-11-04 DIAGNOSIS — M7989 Other specified soft tissue disorders: Secondary | ICD-10-CM | POA: Diagnosis present

## 2023-11-04 DIAGNOSIS — Z794 Long term (current) use of insulin: Secondary | ICD-10-CM | POA: Diagnosis not present

## 2023-11-04 DIAGNOSIS — I25810 Atherosclerosis of coronary artery bypass graft(s) without angina pectoris: Secondary | ICD-10-CM | POA: Diagnosis present

## 2023-11-04 DIAGNOSIS — E1010 Type 1 diabetes mellitus with ketoacidosis without coma: Secondary | ICD-10-CM | POA: Diagnosis present

## 2023-11-04 DIAGNOSIS — K861 Other chronic pancreatitis: Secondary | ICD-10-CM | POA: Diagnosis present

## 2023-11-04 DIAGNOSIS — I130 Hypertensive heart and chronic kidney disease with heart failure and stage 1 through stage 4 chronic kidney disease, or unspecified chronic kidney disease: Secondary | ICD-10-CM | POA: Diagnosis present

## 2023-11-04 DIAGNOSIS — I5022 Chronic systolic (congestive) heart failure: Secondary | ICD-10-CM | POA: Diagnosis present

## 2023-11-04 DIAGNOSIS — R570 Cardiogenic shock: Secondary | ICD-10-CM

## 2023-11-04 DIAGNOSIS — D696 Thrombocytopenia, unspecified: Secondary | ICD-10-CM | POA: Diagnosis not present

## 2023-11-04 DIAGNOSIS — E871 Hypo-osmolality and hyponatremia: Secondary | ICD-10-CM | POA: Diagnosis not present

## 2023-11-04 DIAGNOSIS — K760 Fatty (change of) liver, not elsewhere classified: Secondary | ICD-10-CM | POA: Diagnosis present

## 2023-11-04 DIAGNOSIS — R739 Hyperglycemia, unspecified: Secondary | ICD-10-CM

## 2023-11-04 DIAGNOSIS — Z9641 Presence of insulin pump (external) (internal): Secondary | ICD-10-CM | POA: Diagnosis present

## 2023-11-04 DIAGNOSIS — I5043 Acute on chronic combined systolic (congestive) and diastolic (congestive) heart failure: Secondary | ICD-10-CM | POA: Diagnosis not present

## 2023-11-04 DIAGNOSIS — Z7985 Long-term (current) use of injectable non-insulin antidiabetic drugs: Secondary | ICD-10-CM

## 2023-11-04 DIAGNOSIS — I4510 Unspecified right bundle-branch block: Secondary | ICD-10-CM | POA: Diagnosis present

## 2023-11-04 DIAGNOSIS — Z79899 Other long term (current) drug therapy: Secondary | ICD-10-CM

## 2023-11-04 DIAGNOSIS — N1832 Chronic kidney disease, stage 3b: Secondary | ICD-10-CM | POA: Diagnosis present

## 2023-11-04 DIAGNOSIS — E1022 Type 1 diabetes mellitus with diabetic chronic kidney disease: Secondary | ICD-10-CM | POA: Diagnosis present

## 2023-11-04 DIAGNOSIS — I2489 Other forms of acute ischemic heart disease: Secondary | ICD-10-CM | POA: Diagnosis present

## 2023-11-04 DIAGNOSIS — E66811 Obesity, class 1: Secondary | ICD-10-CM | POA: Diagnosis present

## 2023-11-04 DIAGNOSIS — Z888 Allergy status to other drugs, medicaments and biological substances status: Secondary | ICD-10-CM

## 2023-11-04 DIAGNOSIS — Z6831 Body mass index (BMI) 31.0-31.9, adult: Secondary | ICD-10-CM

## 2023-11-04 DIAGNOSIS — E88819 Insulin resistance, unspecified: Secondary | ICD-10-CM | POA: Diagnosis present

## 2023-11-04 DIAGNOSIS — I2510 Atherosclerotic heart disease of native coronary artery without angina pectoris: Secondary | ICD-10-CM | POA: Diagnosis present

## 2023-11-04 DIAGNOSIS — E119 Type 2 diabetes mellitus without complications: Secondary | ICD-10-CM

## 2023-11-04 DIAGNOSIS — I441 Atrioventricular block, second degree: Secondary | ICD-10-CM | POA: Diagnosis present

## 2023-11-04 DIAGNOSIS — E875 Hyperkalemia: Secondary | ICD-10-CM | POA: Diagnosis present

## 2023-11-04 DIAGNOSIS — Z8572 Personal history of non-Hodgkin lymphomas: Secondary | ICD-10-CM

## 2023-11-04 DIAGNOSIS — Y990 Civilian activity done for income or pay: Secondary | ICD-10-CM

## 2023-11-04 DIAGNOSIS — E781 Pure hyperglyceridemia: Secondary | ICD-10-CM | POA: Diagnosis present

## 2023-11-04 DIAGNOSIS — R9431 Abnormal electrocardiogram [ECG] [EKG]: Secondary | ICD-10-CM | POA: Diagnosis not present

## 2023-11-04 DIAGNOSIS — Z7902 Long term (current) use of antithrombotics/antiplatelets: Secondary | ICD-10-CM

## 2023-11-04 DIAGNOSIS — I5021 Acute systolic (congestive) heart failure: Secondary | ICD-10-CM | POA: Diagnosis not present

## 2023-11-04 DIAGNOSIS — Z7984 Long term (current) use of oral hypoglycemic drugs: Secondary | ICD-10-CM

## 2023-11-04 DIAGNOSIS — Z981 Arthrodesis status: Secondary | ICD-10-CM

## 2023-11-04 DIAGNOSIS — R55 Syncope and collapse: Secondary | ICD-10-CM | POA: Diagnosis present

## 2023-11-04 DIAGNOSIS — Z7982 Long term (current) use of aspirin: Secondary | ICD-10-CM

## 2023-11-04 DIAGNOSIS — N179 Acute kidney failure, unspecified: Secondary | ICD-10-CM | POA: Diagnosis present

## 2023-11-04 DIAGNOSIS — Z955 Presence of coronary angioplasty implant and graft: Secondary | ICD-10-CM

## 2023-11-04 DIAGNOSIS — R001 Bradycardia, unspecified: Principal | ICD-10-CM | POA: Diagnosis present

## 2023-11-04 LAB — BASIC METABOLIC PANEL WITH GFR
Anion gap: 15 (ref 5–15)
Anion gap: 14 (ref 5–15)
BUN: 55 mg/dL — ABNORMAL HIGH (ref 6–20)
BUN: 54 mg/dL — ABNORMAL HIGH (ref 6–20)
CO2: 17 mmol/L — ABNORMAL LOW (ref 22–32)
CO2: 20 mmol/L — ABNORMAL LOW (ref 22–32)
Calcium: 8.3 mg/dL — ABNORMAL LOW (ref 8.9–10.3)
Calcium: 8.5 mg/dL — ABNORMAL LOW (ref 8.9–10.3)
Chloride: 94 mmol/L — ABNORMAL LOW (ref 98–111)
Chloride: 92 mmol/L — ABNORMAL LOW (ref 98–111)
Creatinine, Ser: 3.1 mg/dL — ABNORMAL HIGH (ref 0.61–1.24)
Creatinine, Ser: 2.81 mg/dL — ABNORMAL HIGH (ref 0.61–1.24)
GFR, Estimated: 23 mL/min — ABNORMAL LOW (ref >60)
GFR, Estimated: 26 mL/min — ABNORMAL LOW (ref >60)
Glucose, Bld: 686 mg/dL (ref 70–99)
Glucose, Bld: 699 mg/dL (ref 70–99)
Potassium: 6.6 mmol/L (ref 3.5–5.1)
Potassium: 4.6 mmol/L (ref 3.5–5.1)
Sodium: 126 mmol/L — ABNORMAL LOW (ref 135–145)
Sodium: 126 mmol/L — ABNORMAL LOW (ref 135–145)

## 2023-11-04 LAB — CBC WITH DIFFERENTIAL/PLATELET
Abs Immature Granulocytes: 0.25 K/uL — ABNORMAL HIGH (ref 0.00–0.07)
Basophils Absolute: 0.2 K/uL — ABNORMAL HIGH (ref 0.0–0.1)
Basophils Relative: 2 %
Eosinophils Absolute: 0.3 K/uL (ref 0.0–0.5)
Eosinophils Relative: 2 %
HCT: 41 % (ref 39.0–52.0)
Hemoglobin: 14.1 g/dL (ref 13.0–17.0)
Immature Granulocytes: 2 %
Lymphocytes Relative: 24 %
Lymphs Abs: 3.1 K/uL (ref 0.7–4.0)
MCH: 27.2 pg (ref 26.0–34.0)
MCHC: 34.4 g/dL (ref 30.0–36.0)
MCV: 79 fL — ABNORMAL LOW (ref 80.0–100.0)
Monocytes Absolute: 1.1 K/uL — ABNORMAL HIGH (ref 0.1–1.0)
Monocytes Relative: 8 %
Neutro Abs: 8 K/uL — ABNORMAL HIGH (ref 1.7–7.7)
Neutrophils Relative %: 62 %
Platelets: 190 K/uL (ref 150–400)
RBC: 5.19 MIL/uL (ref 4.22–5.81)
RDW: 16.4 % — ABNORMAL HIGH (ref 11.5–15.5)
WBC: 12.9 K/uL — ABNORMAL HIGH (ref 4.0–10.5)
nRBC: 0 % (ref 0.0–0.2)

## 2023-11-04 LAB — MAGNESIUM: Magnesium: 2.4 mg/dL (ref 1.7–2.4)

## 2023-11-04 LAB — GLUCOSE, CAPILLARY
Glucose-Capillary: >600 mg/dL (ref 70–99)
Glucose-Capillary: >600 mg/dL (ref 70–99)
Glucose-Capillary: >600 mg/dL (ref 70–99)
Glucose-Capillary: 508 mg/dL (ref 70–99)
Glucose-Capillary: 577 mg/dL (ref 70–99)
Glucose-Capillary: >600 mg/dL (ref 70–99)

## 2023-11-04 LAB — CBG MONITORING, ED
Glucose-Capillary: >600 mg/dL (ref 70–99)
Glucose-Capillary: >600 mg/dL (ref 70–99)

## 2023-11-04 LAB — MRSA NEXT GEN BY PCR, NASAL: MRSA by PCR Next Gen: NOT DETECTED

## 2023-11-04 LAB — TROPONIN I (HIGH SENSITIVITY)
Troponin I (High Sensitivity): 40 ng/L — ABNORMAL HIGH (ref <18)
Troponin I (High Sensitivity): 90 ng/L — ABNORMAL HIGH (ref <18)
Troponin I (High Sensitivity): 216 ng/L (ref <18)

## 2023-11-04 LAB — I-STAT CHEM 8, ED
BUN: 69 mg/dL — ABNORMAL HIGH (ref 6–20)
Calcium, Ion: 1.05 mmol/L — ABNORMAL LOW (ref 1.15–1.40)
Chloride: 95 mmol/L — ABNORMAL LOW (ref 98–111)
Creatinine, Ser: 3.1 mg/dL — ABNORMAL HIGH (ref 0.61–1.24)
Glucose, Bld: >700 mg/dL (ref 70–99)
HCT: 44 % (ref 39.0–52.0)
Hemoglobin: 15 g/dL (ref 13.0–17.0)
Potassium: 6.4 mmol/L (ref 3.5–5.1)
Sodium: 124 mmol/L — ABNORMAL LOW (ref 135–145)
TCO2: 23 mmol/L (ref 22–32)

## 2023-11-04 LAB — BRAIN NATRIURETIC PEPTIDE: B Natriuretic Peptide: 234.5 pg/mL — ABNORMAL HIGH (ref 0.0–100.0)

## 2023-11-04 LAB — BETA-HYDROXYBUTYRIC ACID: Beta-Hydroxybutyric Acid: 0.32 mmol/L — ABNORMAL HIGH (ref 0.05–0.27)

## 2023-11-04 MED ORDER — SODIUM BICARBONATE 8.4 % IV SOLN
50.0000 meq | Freq: Once | INTRAVENOUS | Status: AC
  Administered 2023-11-04: 50 meq via INTRAVENOUS
  Filled 2023-11-04: qty 50

## 2023-11-04 MED ORDER — DEXTROSE IN LACTATED RINGERS 5 % IV SOLN: INTRAVENOUS | Status: AC

## 2023-11-04 MED ORDER — FUROSEMIDE 10 MG/ML IJ SOLN
40.0000 mg | Freq: Once | INTRAMUSCULAR | Status: AC
  Administered 2023-11-04: 40 mg via INTRAVENOUS
  Filled 2023-11-04: qty 4

## 2023-11-04 MED ORDER — DEXTROSE 50 % IV SOLN: 0.0000 mL | INTRAVENOUS | Status: DC | PRN

## 2023-11-04 MED ORDER — ORAL CARE MOUTH RINSE: 15.0000 mL | OROMUCOSAL | Status: DC | PRN

## 2023-11-04 MED ORDER — CALCIUM GLUCONATE-NACL 1-0.675 GM/50ML-% IV SOLN
1.0000 g | Freq: Once | INTRAVENOUS | Status: AC
  Administered 2023-11-04: 1000 mg via INTRAVENOUS
  Filled 2023-11-04: qty 50

## 2023-11-04 MED ORDER — ALBUTEROL SULFATE (2.5 MG/3ML) 0.083% IN NEBU
2.5000 mg | INHALATION_SOLUTION | Freq: Once | RESPIRATORY_TRACT | Status: AC
  Administered 2023-11-04: 2.5 mg via RESPIRATORY_TRACT
  Filled 2023-11-04: qty 3

## 2023-11-04 MED ORDER — DOCUSATE SODIUM 100 MG PO CAPS
100.0000 mg | ORAL_CAPSULE | Freq: Two times a day (BID) | ORAL | Status: DC | PRN
Start: 2023-11-04 — End: 2023-11-07

## 2023-11-04 MED ORDER — LACTATED RINGERS IV SOLN: INTRAVENOUS | Status: AC

## 2023-11-04 MED ORDER — POLYETHYLENE GLYCOL 3350 17 G PO PACK: 17.0000 g | PACK | Freq: Every day | ORAL | Status: DC | PRN

## 2023-11-04 MED ORDER — CHLORHEXIDINE GLUCONATE CLOTH 2 % EX PADS
6.0000 | MEDICATED_PAD | Freq: Every day | CUTANEOUS | Status: DC
  Administered 2023-11-04 – 2023-11-07 (×4): 6 via TOPICAL

## 2023-11-04 MED ORDER — HEPARIN SODIUM (PORCINE) 5000 UNIT/ML IJ SOLN
5000.0000 [IU] | Freq: Three times a day (TID) | INTRAMUSCULAR | Status: DC
  Administered 2023-11-05 – 2023-11-07 (×6): 5000 [IU] via SUBCUTANEOUS
  Filled 2023-11-04 (×6): qty 1

## 2023-11-04 MED ORDER — EPINEPHRINE HCL 5 MG/250ML IV SOLN IN NS
0.5000 ug/min | INTRAVENOUS | Status: DC
  Administered 2023-11-04: 0.5 ug/min via INTRAVENOUS
  Filled 2023-11-04: qty 250

## 2023-11-04 MED ORDER — INSULIN REGULAR(HUMAN) IN NACL 100-0.9 UT/100ML-% IV SOLN
INTRAVENOUS | Status: DC
  Administered 2023-11-04: 8 [IU]/h via INTRAVENOUS
  Administered 2023-11-05: 16 [IU]/h via INTRAVENOUS
  Administered 2023-11-05: 17 [IU]/h via INTRAVENOUS
  Administered 2023-11-05: 18 [IU]/h via INTRAVENOUS
  Administered 2023-11-06: 11 [IU]/h via INTRAVENOUS
  Filled 2023-11-04 (×5): qty 100

## 2023-11-04 MED ORDER — INSULIN GLARGINE 100 UNIT/ML ~~LOC~~ SOLN
30.0000 [IU] | Freq: Once | SUBCUTANEOUS | Status: AC
  Administered 2023-11-04: 30 [IU] via SUBCUTANEOUS
  Filled 2023-11-04: qty 0.3

## 2023-11-04 NOTE — ED Provider Notes (Signed)
 [place] EMERGENCY DEPARTMENT AT [facility] Provider Note   CSN: [id number] Arrival date & time: 11/04/23  1744     Patient presents with: Bradycardia   Juan [name] is a 58 y.o. male.   58 year old male brought in by EMS for evaluation of bradycardia.  History obtained primarily from EMS.  They state patient was at work today felt very lightheaded thought maybe he was hypoglycemic.  Does have an insulin  pump.  Drink some apple juice and was found to be bradycardic by staff there.  EMS found him with heart rate in the 20s.  Given a dose of 2 mg of atropine  with no improvement.  He was started on epi drip.  Does have a history of CHF.  Patient is awake and alert and states he feels much better than earlier.  He denies any chest pain or shortness of breath or any other symptoms or concerns at this time.        Prior to Admission medications   Medication Sig Start Date End Date Taking? Authorizing Provider  aspirin  81 MG chewable tablet Chew 1 tablet (81 mg total) by mouth daily. 12/19/16  Yes Henry Shaver B, NP  clopidogrel  (PLAVIX ) 75 MG tablet TAKE 1 TABLET BY MOUTH EVERY DAY 04/24/23  Yes Court Dorn PARAS, MD  fenofibrate  160 MG tablet Take 160 mg by mouth in the morning.   Yes [provider]  icosapent  Ethyl (VASCEPA ) 1 g capsule TAKE 2 CAPSULES BY MOUTH TWICE A DAY 09/24/23  Yes Bensimhon, Toribio SAUNDERS, MD  Insulin  Degludec (TRESIBA Dawson) Inject 65 Units into the skin in the morning.   Yes [provider]  insulin  regular human CONCENTRATED (HUMULIN  R) 500 UNIT/ML injection Inject into the skin continuous. Pump setting 125 delivers up to 630 units (max daily dosage via pump)   Yes [provider]  isosorbide  mononitrate (IMDUR ) 30 MG 24 hr tablet TAKE 1 TABLET BY MOUTH EVERY DAY 02/05/23  Yes Hilty, Vinie BROCKS, MD  metFORMIN  (GLUCOPHAGE ) 1000 MG tablet Take 1,000 mg by mouth 2 (two) times daily with a meal.   Yes [provider]   metoprolol  succinate (TOPROL -XL) 25 MG 24 hr tablet TAKE 3 TABLETS BY MOUTH TWICE A DAY WITH OR IMMEDIATELY FOLLOWING A MEAL Patient taking differently: Take 75 mg by mouth 2 (two) times daily. 04/04/23  Yes Court Dorn PARAS, MD  nitroGLYCERIN  (NITROSTAT ) 0.4 MG SL tablet Place 1 tablet (0.4 mg total) under the tongue every 5 (five) minutes as needed. 09/27/23  Yes Milford, Harlene HERO, FNP  Propylene Glycol (SYSTANE BALANCE) 0.6 % SOLN Place 1 drop into both eyes 4 (four) times daily as needed (dry eyes).   Yes [provider]  rosuvastatin  (CRESTOR ) 5 MG tablet TAKE 1 TABLET (5 MG TOTAL) BY MOUTH DAILY. 08/05/23 07/30/24 Yes Hilty, Vinie BROCKS, MD  sacubitril -valsartan  (ENTRESTO ) 49-51 MG Take 1 tablet by mouth 2 (two) times daily. NEEDS FOLLOW UP APPOINTMENT FOR MORE REFILLS 12/20/22  Yes Bensimhon, Toribio SAUNDERS, MD  spironolactone  (ALDACTONE ) 25 MG tablet TAKE 1 TABLET BY MOUTH EVERY DAY 07/09/23  Yes Bensimhon, Toribio SAUNDERS, MD  torsemide  (DEMADEX ) 20 MG tablet Take 4 tablets (80 mg total) by mouth 2 (two) times daily. 10/18/23 01/16/24 Yes Milford, Harlene HERO, FNP  Vitamin D , Ergocalciferol , (DRISDOL) 1.25 MG (50000 UNIT) CAPS capsule Take 50,000 Units by mouth 3 (three) times a week.   Yes [provider]  Continuous Blood Gluc Receiver (DEXCOM G7 RECEIVER) DEVI by  Does not apply route.    [provider]  Glucose Blood (BAYER CONTOUR NEXT TEST VI) glucose testing    [provider]  Semaglutide (OZEMPIC, 0.25 OR 0.5 MG/DOSE, [facility]) Inject 0.5 mg into the skin once a week.    [provider]    Allergies: Empagliflozin , Sglt2 inhibitors, and Reglan [metoclopramide]    Review of Systems  Constitutional:  Negative for chills and fever.  HENT:  Negative for ear pain and sore throat.   Eyes:  Negative for pain and visual disturbance.  Respiratory:  Negative for cough and shortness of breath.   Cardiovascular:  Negative for chest pain and palpitations.   Gastrointestinal:  Negative for abdominal pain and vomiting.  Genitourinary:  Negative for dysuria and hematuria.  Musculoskeletal:  Negative for arthralgias and back pain.  Skin:  Negative for color change and rash.  Neurological:  Positive for light-headedness. Negative for seizures and syncope.  All other systems reviewed and are negative.   Updated Vital Signs BP (!) 118/53   Pulse (!) 36   Temp 98.4 F (36.9 C) (Oral)   Resp 20   Ht 5' 11 (1.803 m)   Wt 102.3 kg   SpO2 96%   BMI 31.46 kg/m   Physical Exam Vitals and nursing note reviewed.  Constitutional:      General: He is not in acute distress.    Appearance: He is well-developed. He is ill-appearing.  HENT:     Head: Normocephalic and atraumatic.  Eyes:     Conjunctiva/sclera: Conjunctivae normal.  Cardiovascular:     Rate and Rhythm: Regular rhythm. Bradycardia present.     Heart sounds: No murmur heard. Pulmonary:     Effort: Pulmonary effort is normal. No respiratory distress.     Breath sounds: Normal breath sounds.  Abdominal:     Palpations: Abdomen is soft.     Tenderness: There is no abdominal tenderness.  Musculoskeletal:        General: No swelling.     Cervical back: Neck supple.  Skin:    General: Skin is warm and dry.     Capillary Refill: Capillary refill takes less than 2 seconds.     Coloration: Skin is pale.  Neurological:     Mental Status: He is alert.  Psychiatric:        Mood and Affect: Mood normal.     (all labs ordered are listed, but only abnormal results are displayed) Labs Reviewed  BASIC METABOLIC PANEL WITH GFR - Abnormal; Notable for the following components:      Result Value   Sodium 126 (*)    Potassium 6.6 (*)    Chloride 94 (*)    CO2 17 (*)    Glucose, Bld 686 (*)    BUN 55 (*)    Creatinine, Ser 3.10 (*)    Calcium  8.3 (*)    GFR, Estimated 23 (*)    All other components within normal limits  BRAIN NATRIURETIC PEPTIDE - Abnormal; Notable for the  following components:   B Natriuretic Peptide 234.5 (*)    All other components within normal limits  CBC WITH DIFFERENTIAL/PLATELET - Abnormal; Notable for the following components:   WBC 12.9 (*)    MCV 79.0 (*)    RDW 16.4 (*)    Neutro Abs 8.0 (*)    Monocytes Absolute 1.1 (*)    Basophils Absolute 0.2 (*)    Abs Immature Granulocytes 0.25 (*)    All other components  within normal limits  BETA-HYDROXYBUTYRIC ACID - Abnormal; Notable for the following components:   Beta-Hydroxybutyric Acid 0.32 (*)    All other components within normal limits  GLUCOSE, CAPILLARY - Abnormal; Notable for the following components:   Glucose-Capillary >600 (*)    All other components within normal limits  GLUCOSE, CAPILLARY - Abnormal; Notable for the following components:   Glucose-Capillary >600 (*)    All other components within normal limits  GLUCOSE, CAPILLARY - Abnormal; Notable for the following components:   Glucose-Capillary >600 (*)    All other components within normal limits  GLUCOSE, CAPILLARY - Abnormal; Notable for the following components:   Glucose-Capillary >600 (*)    All other components within normal limits  GLUCOSE, CAPILLARY - Abnormal; Notable for the following components:   Glucose-Capillary 508 (*)    All other components within normal limits  GLUCOSE, CAPILLARY - Abnormal; Notable for the following components:   Glucose-Capillary 577 (*)    All other components within normal limits  I-STAT CHEM 8, ED - Abnormal; Notable for the following components:   Sodium 124 (*)    Potassium 6.4 (*)    Chloride 95 (*)    BUN 69 (*)    Creatinine, Ser 3.10 (*)    Glucose, Bld >700 (*)    Calcium , Ion 1.05 (*)    All other components within normal limits  CBG MONITORING, ED - Abnormal; Notable for the following components:   Glucose-Capillary >600 (*)    All other components within normal limits  CBG MONITORING, ED - Abnormal; Notable for the following components:    Glucose-Capillary >600 (*)    All other components within normal limits  TROPONIN I (HIGH SENSITIVITY) - Abnormal; Notable for the following components:   Troponin I (High Sensitivity) 40 (*)    All other components within normal limits  TROPONIN I (HIGH SENSITIVITY) - Abnormal; Notable for the following components:   Troponin I (High Sensitivity) 90 (*)    All other components within normal limits  MRSA NEXT GEN BY PCR, NASAL  HIV ANTIBODY (ROUTINE TESTING W REFLEX)  CBC  BASIC METABOLIC PANEL WITH GFR  MAGNESIUM   PHOSPHORUS  HEMOGLOBIN A1C  BASIC METABOLIC PANEL WITH GFR  BASIC METABOLIC PANEL WITH GFR  BASIC METABOLIC PANEL WITH GFR  MAGNESIUM   BASIC METABOLIC PANEL WITH GFR  TROPONIN I (HIGH SENSITIVITY)    EKG: EKG Interpretation Date/Time:  Monday November 04 2023 19:15:44 EDT Ventricular Rate:  57 PR Interval:  125 QRS Duration:  115 QT Interval:  483 QTC Calculation: 471 R Axis:   162  Text Interpretation: Sinus rhythm LAE, consider biatrial enlargement Nonspecific intraventricular conduction delay Low voltage with right axis deviation Repol abnrm, prob ischemia, anterolateral leads ST abnormalities are new when compared to prior EKG from earlier today Confirmed by Gennaro Bouchard ([id number]) on 11/04/2023 8:12:59 PM  Radiology: [facility] Chest 1 View Result Date: 11/04/2023 CLINICAL DATA:  Shortness of breath EXAM: CHEST  1 VIEW COMPARISON:  05/25/2022 FINDINGS: Hardware in the cervical spine. Post sternotomy changes. Mild low lung volume. No focal opacity, pleural effusion, or pneumothorax. Cardiomediastinal silhouette within normal limits. Coronary stent. IMPRESSION: No active disease. Low lung volume. Electronically Signed   By: Luke Bun M.D.   On: 11/04/2023 18:24     Procedures   Medications Ordered in the ED  EPINEPHrine  (ADRENALIN ) 5 mg in NS 250 mL (0.02 mg/mL) premix infusion (0.5 mcg/min Intravenous Infusion Verify 11/04/23 2200)  insulin  regular, human  (MYXREDLIN ) 100  units/ 100 mL infusion (14 Units/hr Intravenous Infusion Verify 11/04/23 2200)  lactated ringers  infusion ( Intravenous Infusion Verify 11/04/23 2200)  dextrose  5 % in lactated ringers  infusion (0 mLs Intravenous Hold 11/04/23 1948)  dextrose  50 % solution 0-50 mL (has no administration in time range)  Chlorhexidine  Gluconate Cloth 2 % PADS 6 each (6 each Topical Given 11/04/23 2112)  docusate sodium  (COLACE) capsule 100 mg (has no administration in time range)  polyethylene glycol (MIRALAX  / GLYCOLAX ) packet 17 g (has no administration in time range)  heparin  injection 5,000 Units (has no administration in time range)  Oral care mouth rinse (has no administration in time range)  calcium  gluconate 1 g/ 50 mL sodium chloride  IVPB (0 mg Intravenous Stopped 11/04/23 2004)  sodium bicarbonate  injection 50 mEq (50 mEq Intravenous Given 11/04/23 1913)  albuterol  (PROVENTIL ) (2.5 MG/3ML) 0.083% nebulizer solution 2.5 mg (2.5 mg Nebulization Given 11/04/23 1917)  furosemide  (LASIX ) injection 40 mg (40 mg Intravenous Given 11/04/23 1912)  insulin  glargine (LANTUS ) injection 30 Units (30 Units Subcutaneous Given 11/04/23 2208)                                    Medical Decision Making Cardiac monitor interpretation: Sinus bradycardia, 3-1 block, abnormal rhythm  Patient here for symptomatic bradycardia.  On EMS arrival heart rate was in the 20s.  They state his blood pressure was stable.  They started him on epi drip after atropine  did not help and his blood pressure and heart rate have improved to the 40s.  He has a history of diabetes and has been drinking apple juice as he thought his sugar was low.  Has also been bolusing himself with insulin .  Cardiology was consulted I spoke with Dr. Sunny thinks we should correct patient's electrolytes and they will plan to follow along and potentially put in a temporary pacemaker.  Patient was maintained on epinephrine  drip down here with relatively  stable vitals besides heart rate in the 40s.  He was found to have significant hyperglycemia and hyperkalemia as well as an AKI.  He was given a small amount of IV fluids as he does have a history of CHF with a EF of 45%, but we started him on medicine for hyperkalemia as well as an insulin  drip.  Discussed patient case with critical care and patient will be admitted for further workup and management.  Patient and family at bedside are agreeable with the plan.  Problems Addressed: AKI (acute kidney injury) [facility]): acute illness or injury that poses a threat to life or bodily functions Hyperglycemia: acute illness or injury that poses a threat to life or bodily functions Hyperkalemia: acute illness or injury that poses a threat to life or bodily functions Symptomatic bradycardia: acute illness or injury that poses a threat to life or bodily functions  Amount and/or Complexity of Data Reviewed Independent Historian: EMS    Details: EMS help to provide history regarding patient's case-heart rate initially in the 20s, as well as 18 in the ED on atropine  and epinephrine  External Data Reviewed: notes.    Details: Outpatient records reviewed and previous echocardiogram reviewed.  Patient has a history of CHF with a EF of 45%. Labs: ordered. Decision-making details documented in ED Course.    Details: Ordered and reviewed by me and patient has significant hyperkalemia, AKI and hyperglycemia as well as slightly elevated troponin Radiology: ordered and independent interpretation performed.  Decision-making details documented in ED Course.    Details: Ordered and interpreted by me independently radiology Chest x-ray: Shows no acute abnormality ECG/medicine tests: ordered and independent interpretation performed. Decision-making details documented in ED Course.    Details: EKG reviewed by me and discussed with cardiology-shows evidence of 3-1 AV block with some supraventricular bigeminy Discussion of  management or test interpretation with external provider(s): Dr. [place]-cardiology-I spoke with her on the phone regarding the patient and she recommended treating the patient's electrolytes prior to pacemaker  Dr. Marshall-critical care-spoke with her on the phone regarding the patient and she will take the patient to her service for further workup and management on an epinephrine  drip  Risk OTC drugs. Prescription drug management. Drug therapy requiring intensive monitoring for toxicity. Decision regarding hospitalization. Risk Details: CRITICAL CARE Performed by: Duwaine LITTIE Fusi   Total critical care time: 55 minutes  Critical care time was exclusive of separately billable procedures and treating other patients.  Critical care was necessary to treat or prevent imminent or life-threatening deterioration.  Critical care was time spent personally by me on the following activities: development of treatment plan with patient and/or surrogate as well as nursing, discussions with consultants, evaluation of patient's response to treatment, examination of patient, obtaining history from patient or surrogate, ordering and performing treatments and interventions, ordering and review of laboratory studies, ordering and review of radiographic studies, pulse oximetry and re-evaluation of patient's condition.   Critical Care Total time providing critical care: 55 minutes     Final diagnoses:  Symptomatic bradycardia  Hyperkalemia  Hyperglycemia  AKI (acute kidney injury) [facility])    ED Discharge Orders     None          Fusi Duwaine LITTIE, DO 11/04/23 2332

## 2023-11-04 NOTE — Consult Note (Signed)
 Cardiology Consultation   Patient ID: Juan [name] MRN: [id number]; DOB: 1966-01-29  Admit date: 11/04/2023 Date of Consult: 11/04/2023  PCP:  Charlott Dorn LABOR, MD   [facility] Providers Cardiologist:  Dorn Lesches, MD        Patient Profile: Juan [name] is a 58 y.o. male with a hx of HFmrEF, 3v CABG, DM type 1, hypertriglyceridemia, recurrent pancreatitis, and PAD s/p LLE bypass  who is being seen 11/04/2023 for the evaluation of symptomatic bradycardia at the request of Dr. Gennaro.   History of Present Illness: [name] experienced weakness in his limbs and pain when getting up after a couple of hours at work today. Initially attributing these symptoms to high blood sugar as these are his typical symptoms, he administered a bolus of insulin  before lunch. Post-lunch, he felt a recurrence of symptoms and bolused again. While working, he experienced sudden weakness, vision changes, and sweating, which he interpreted as low blood sugar due to over-adjustment of insulin . He consumed apple juice slowly to avoid rapid sugar increase, but symptoms persisted and worsened.  He has not performed finger stick blood glucose monitoring in over a year. He uses U-500 insulin  and has experienced overdosing in the past.  He has struggled to control his glucose due to insulin  resistance. Despite consuming apple juice, his symptoms did not improve, and he struggled to move, eventually requiring assistance at work to call EMS.  EMS reportedly found glucose >500.  He was also bradycardic intermittently to the 30s and was given atropine .  There was no improvement in his heart rate and he was started on epinephrine .  He had labs on 8/15 which revealed potassium of 6.6 and glucose 658.  He had AKI with creatinine 2.0 up from his baseline of 1.3.   He was informed of high potassium levels and advised to stop potassium supplements.  He was scheduled for a follow-up to recheck his levels but opted  to wait until his day off due to frequent medical appointments. He currently feels fatigued but not short of breath or dizzy.     He reports swelling in the left leg where a vein was removed for bypass surgery. No fever, chills, or burning during urination.  He uses U-500 insulin  in his pump, which is nearly empty, and he is concerned about his blood sugar levels potentially spiking further.  [name] has a history of heart failure and has been followed in the heart failure clinic.  His last echo was 02/2023 and LVEF was 45-50% with global hypokinesis and mildly reduced RV function.  Right atrial pressure was 15 mmHg.  Lately he notes that his heart failure symptoms have been well-controlled.  He denies any right lower extremity edema, orthopnea, or PND.  He has not had any shortness of breath.  EKG at baseline sinus rhythm with a first-degree AV block branch block.  He takes metoprolol  succinate 75 mg twice daily.   Past Medical History:  Diagnosis Date   Abnormal cardiovascular stress test 02/06/2013   Anemia    occasional - no problems currently(10/02/2011)   Bilateral renal cysts 06/16/2011   states no known problems   Blood transfusion without reported diagnosis    CAD (coronary artery disease), LAD 90%, 1st diag 95%, LCX 70% wth AV groove 90%, RCA 40-50% mid and 80% long distal stenosis 02/03/13 02/04/2013   Chest pain    positive Myoview  stress test   CHF (congestive heart failure) (HCC)    Coronary  artery calcification seen on CAT scan    Diabetes mellitus    IDDM   Fatty liver disease, nonalcoholic 06/16/2011   Hyperlipidemia    Hypertension    states no dx. of HTN, takes med. to protect kidneys due to DM   Lateral meniscus tear 09/2011   left   Loose body in knee 09/2011   loose bodies left knee   Non Hodgkin's lymphoma (HCC) 1991   Pancreatitis    occasional - last episode 06/2011   Pneumonia    S/P CABG x 4, 02/05/13 LIMA-LAD; LT. RADIAL-OM;VG-DIAG; VG-PDA 02/06/2013    10/18 3/4 patent grafts (occluded SVG-->Diag), PCI/DESx 3 SVG-->RCA, normal EF   Sleep apnea    mild per patient   Splenomegaly, congestive, chronic    Stuffy and runny nose 10/02/2011   yellow drainage from nose    Past Surgical History:  Procedure Laterality Date   ABDOMINAL AORTIC ANEURYSM REPAIR     ABDOMINAL AORTOGRAM W/LOWER EXTREMITY N/A 07/18/2021   Procedure: ABDOMINAL AORTOGRAM W/LOWER EXTREMITY;  Surgeon: Serene Gaile ORN, MD;  Location: [facility];  Service: Cardiovascular;  Laterality: N/A;   ANTERIOR CERVICAL DECOMP/DISCECTOMY FUSION N/A 03/26/2018   Procedure: ANTERIOR CERVICAL DECOMPRESSION FUSION CERVICAL 5-6 WITH INSTRUMENTATION AND ALLOGRAFT;  Surgeon: Beuford Anes, MD;  Location: [facility] OR;  Service: Orthopedics;  Laterality: N/A;   CARDIAC CATHETERIZATION     CORONARY ARTERY BYPASS GRAFT N/A 02/05/2013   Procedure: CORONARY ARTERY BYPASS GRAFTING (CABG);  Surgeon: Maude Fleeta Ochoa, MD;  Location: [facility] OR;  Service: Open Heart Surgery;  Laterality: N/A;  Coronary artery bypass graft on pump times four using left internal mammary artery and right greater saphenous vein via endovein harvest and left radial artery harvest.    CORONARY STENT INTERVENTION  12/17/2016    PCI and drug-eluting stenting of the mid and distal RCA SVG    CORONARY STENT INTERVENTION N/A 12/17/2016   Procedure: CORONARY STENT INTERVENTION;  Surgeon: Court Dorn PARAS, MD;  Location: [facility];  Service: Cardiovascular;  Laterality: N/A;   ELBOW SURGERY     FEMORAL-POPLITEAL BYPASS GRAFT Left 07/28/2021   Procedure: LEFT FEMORAL-BELOW KNEE POPLITEAL ARTERY BYPASS GRAFT;  Surgeon: Serene Gaile ORN, MD;  Location: [facility];  Service: Vascular;  Laterality: Left;   HERNIA REPAIR     inguinal    herniated disc     INTRAOPERATIVE TRANSESOPHAGEAL ECHOCARDIOGRAM N/A 02/05/2013   Procedure: INTRAOPERATIVE TRANSESOPHAGEAL ECHOCARDIOGRAM;  Surgeon: Maude Fleeta Ochoa, MD;  Location[facility] OR;  Service:  Open Heart Surgery;  Laterality: N/A;   KNEE ARTHROSCOPY  10/09/2011   Procedure: ARTHROSCOPY KNEE;  Surgeon: Eva Elsie Herring, MD;  Location: [facility];  Service: Orthopedics;  Laterality: Left;   LEFT HEART CATH AND CORS/GRAFTS ANGIOGRAPHY N/A 12/17/2016   Procedure: LEFT HEART CATH AND CORS/GRAFTS ANGIOGRAPHY;  Surgeon: Court Dorn PARAS, MD;  [facility]: [facility];  Service: Cardiovascular;  Laterality: N/A;   LEFT HEART CATHETERIZATION WITH CORONARY ANGIOGRAM N/A 02/03/2013   Procedure: LEFT HEART CATHETERIZATION WITH CORONARY ANGIOGRAM;  Surgeon: Dorn PARAS Court, MD;  Location: [facility] CATH LAB;  Service: Cardiovascular;  Laterality: N/A;   NASAL SEPTUM SURGERY     PARATHYROIDECTOMY Right 11/23/2021   Procedure: RIGHT INFERIOR PARATHYROIDECTOMY;  Surgeon: Eletha Boas, MD;  Location: [facility] ORS;  Service: General;  Laterality: Right;   RADIAL ARTERY HARVEST Left 02/05/2013   Procedure: RADIAL ARTERY HARVEST;  Surgeon: Maude Fleeta Ochoa, MD;  Location: [facility] OR;  Service: Vascular;  Laterality: Left;   RIGHT/LEFT HEART CATH AND CORONARY/GRAFT ANGIOGRAPHY N/A 02/15/2020   Procedure: RIGHT/LEFT HEART CATH AND CORONARY/GRAFT ANGIOGRAPHY;  Surgeon: Court Dorn PARAS, MD;  [facility]: [facility];  Service: Cardiovascular;  Laterality: N/A;   TEE WITHOUT CARDIOVERSION N/A 02/18/2020   Procedure: TRANSESOPHAGEAL ECHOCARDIOGRAM (TEE);  Surgeon: Cherrie Toribio SAUNDERS, MD;  Location: [facility] ENDOSCOPY;  Service: Cardiovascular;  Laterality: N/A;   TIBIA BONE BIOPSY  x 3   left   TONSILLECTOMY     VEIN HARVEST Left 07/28/2021   Procedure: HARVEST OF GREAT SAPHENOUS VEIN;  Surgeon: Serene Gaile ORN, MD;  Location: [facility] OR;  Service: Vascular;  Laterality: Left;     Home Medications:  Prior to Admission medications   Medication Sig Start Date End Date Taking? Authorizing Provider  aspirin  81 MG chewable tablet Chew 1 tablet (81 mg total) by mouth daily. 12/19/16   Henry Manuelita NOVAK, NP   clopidogrel  (PLAVIX ) 75 MG tablet TAKE 1 TABLET BY MOUTH EVERY DAY 04/24/23   Court Dorn PARAS, MD  Continuous Blood Gluc Receiver (DEXCOM G7 RECEIVER) DEVI by Does not apply route.    [provider]  fenofibrate  160 MG tablet Take 160 mg by mouth in the morning.    [provider]  Glucose Blood (BAYER CONTOUR NEXT TEST VI) glucose testing    [provider]  icosapent  Ethyl (VASCEPA ) 1 g capsule TAKE 2 CAPSULES BY MOUTH TWICE A DAY 09/24/23   Bensimhon, Toribio SAUNDERS, MD  Insulin  Degludec (TRESIBA [place]) Inject 65 Units into the skin in the morning.    [provider]  insulin  regular human CONCENTRATED (HUMULIN  R) 500 UNIT/ML injection Inject into the skin continuous. Pump setting 125 delivers up to 630 units (max daily dosage via pump)    [provider]  isosorbide  mononitrate (IMDUR ) 30 MG 24 hr tablet TAKE 1 TABLET BY MOUTH EVERY DAY 02/05/23   Hilty, Vinie BROCKS, MD  metFORMIN  (GLUCOPHAGE ) 1000 MG tablet Take 1,000 mg by mouth 2 (two) times daily with a meal.    [provider]  metolazone  (ZAROXOLYN ) 2.5 MG tablet Take 1 tablet (2.5 mg total) by mouth as needed. Take a directed by the heart failure clinic 09/27/23   Glena Harlene HERO, FNP  metoprolol  succinate (TOPROL -XL) 25 MG 24 hr tablet TAKE 3 TABLETS BY MOUTH TWICE A DAY WITH OR IMMEDIATELY FOLLOWING A MEAL 04/04/23   Court Dorn PARAS, MD  nitroGLYCERIN  (NITROSTAT ) 0.4 MG SL tablet Place 1 tablet (0.4 mg total) under the tongue every 5 (five) minutes as needed. 09/27/23   [place], Harlene HERO, FNP  Propylene Glycol (SYSTANE BALANCE) 0.6 % SOLN Place 1 drop into both eyes 4 (four) times daily as needed (dry eyes).    [provider]  rosuvastatin  (CRESTOR ) 5 MG tablet TAKE 1 TABLET (5 MG TOTAL) BY MOUTH DAILY. 08/05/23 07/30/24  Mona Vinie BROCKS, MD  sacubitril -valsartan  (ENTRESTO ) 49-51 MG Take 1 tablet by mouth 2 (two) times daily. NEEDS FOLLOW UP APPOINTMENT FOR MORE REFILLS 12/20/22    Bensimhon, Toribio SAUNDERS, MD  Semaglutide (OZEMPIC, 0.25 OR 0.5 MG/DOSE, [place]) Inject 0.5 mg into the skin once a week.    [provider]  spironolactone  (ALDACTONE ) 25 MG tablet TAKE 1 TABLET BY MOUTH EVERY DAY 07/09/23   Bensimhon, Toribio SAUNDERS, MD  torsemide  (DEMADEX ) 20 MG tablet Take 4 tablets (80 mg total) by mouth 2 (two) times daily. 10/18/23 01/16/24  Glena Harlene HERO, FNP    Scheduled Meds:  albuterol   2.5 mg Nebulization Once   furosemide   40 mg Intravenous Once   sodium bicarbonate   50 mEq Intravenous Once   Continuous Infusions:  calcium  gluconate     dextrose  5% lactated ringers      epinephrine  0.5 mcg/min (11/04/23 1852)   insulin      lactated ringers      PRN Meds: dextrose   Allergies:    Allergies  Allergen Reactions   Empagliflozin  Other (See Comments)    Caused skin infection   Reglan [Metoclopramide] Other (See Comments)    Hyperactivity with higher doses (can tolerate lower doses)    Sglt2 Inhibitors Other (See Comments)    Cellulitis with [place]  and [place]    Social History:   Social History   Socioeconomic History   Marital status: Married    Spouse name: Research scientist (physical sciences)   Number of children: 1   Years of education: 16   Highest education level: Not on file  Occupational History   Occupation: Biochemist, clinical     Employer: [employer]  Tobacco Use   Smoking status: Never    Passive exposure: Never   Smokeless tobacco: Never  Vaping Use   Vaping status: Never Used  Substance and Sexual Activity   Alcohol  use: No   Drug use: No   Sexual activity: Yes  Other Topics Concern   Not on file  Social History Narrative   Adopted, so no pertinent FH.  Married.  Lives with wife in [place].  Independent of ADLs and ambulation.   Social Drivers of Corporate investment banker Strain: Not on file  Food Insecurity: Not on file  Transportation Needs: Not on file  Physical Activity: Not on file  Stress: Not on file  Social  Connections: Not on file  Intimate Partner Violence: Not on file    Family History:    Family History  Adopted: Yes     ROS:  Please see the history of present illness.  All other ROS reviewed and negative.     Physical Exam/Data: Vitals:   11/04/23 1750 11/04/23 1753 11/04/23 1805 11/04/23 1808  BP: (!) 124/53     Pulse: 82  91   Resp: 17  17   Temp:    (!) 97.3 F (36.3 C)  TempSrc:    Oral  SpO2: 100%  99%   Weight:  102.1 kg    Height:  5' 11 (1.803 m)     No intake or output data in the 24 hours ending 11/04/23 1900    11/04/2023    5:53 PM 10/31/2023    2:36 PM 10/18/2023    9:41 AM  Last 3 Weights  Weight (lbs) 225 lb 225 lb 6.4 oz 238 lb  Weight (kg) 102.059 kg 102.241 kg 107.956 kg     VS:  BP (!) 124/53   Pulse 91   Temp (!) 97.3 F (36.3 C) (Oral)   Resp 17   Ht 5' 11 (1.803 m)   Wt 102.1 kg   SpO2 99%   BMI 31.38 kg/m  , BMI Body mass index is 31.38 kg/m. GENERAL:  Well appearing.  No acute  HEENT: Pupils equal round and reactive, fundi not visualized, oral mucosa unremarkable NECK:  No jugular venous distention, waveform within normal limits, carotid upstroke brisk and symmetric, no bruits, no thyromegaly LUNGS:  Clear to auscultation bilaterally HEART:  bradycardic.  Irregularly irregular.  PMI not displaced or sustained,S1 and S2 within normal limits, no S3, no S4, no  clicks, no rubs, no murmurs ABD:  Flat, positive bowel sounds normal in frequency in pitch, no bruits, no rebound, no guarding, no midline pulsatile mass, no hepatomegaly, no splenomegaly EXT:  2 plus pulses throughout, 2+ L LE edema, no cyanosis no clubbing SKIN:  No rashes no nodules NEURO:  Cranial nerves II through XII grossly intact, motor grossly intact throughout PSYCH:  Cognitively intact, oriented to person place and time  EKG:  The EKG was personally reviewed and demonstrates:  Mobitz II second degree AV block.  Frequent dropped beats.  RBBB.  Ventricular rate 33  bpm. Telemetry:  Telemetry was personally reviewed and demonstrates:  Mobitz II second degree AV block.  Frequent dropped beats  Relevant CV Studies: Echo 03/08/23: 1. Left ventricular ejection fraction, by estimation, is 45 to 50%. Left  ventricular ejection fraction by 2D MOD biplane is 45.1 %. The left  ventricle has mildly decreased function. The left ventricle demonstrates  global hypokinesis. The left  ventricular internal cavity size was mildly dilated. There is moderate  asymmetric left ventricular hypertrophy of the infero-lateral segment.  Left ventricular diastolic parameters are consistent with Grade II  diastolic dysfunction (pseudonormalization).  Elevated left ventricular end-diastolic pressure.   2. Right ventricular systolic function is mildly reduced. The right  ventricular size is normal. Tricuspid regurgitation signal is inadequate  for assessing PA pressure.   3. Left atrial size was moderately dilated.   4. The mitral valve is abnormal. Trivial mitral valve regurgitation. The  mean mitral valve gradient is 4.0 mmHg with average heart rate of 87 bpm.   5. The aortic valve is tricuspid. There is mild calcification of the  aortic valve. Aortic valve regurgitation is trivial. Aortic valve  sclerosis is present, with no evidence of aortic valve stenosis.   6. The inferior vena cava is dilated in size with <50% respiratory  variability, suggesting right atrial pressure of 15 mmHg.    Laboratory Data: High Sensitivity Troponin:   Recent Labs  Lab 11/04/23 1759  TROPONINIHS 40*     Chemistry Recent Labs  Lab 11/01/23 0945 11/04/23 1759 11/04/23 1844  NA 127* 126* 124*  K 6.6* 6.6* 6.4*  CL 93* 94* 95*  CO2 19* 17*  --   GLUCOSE 658* 686* >700*  BUN 55* 55* 69*  CREATININE 2.05* 3.10* 3.10*  CALCIUM  8.8* 8.3*  --   GFRNONAA 37* 23*  --   ANIONGAP 15 15  --     No results for input(s): PROT, ALBUMIN , AST, ALT, ALKPHOS, BILITOT in the last 168  hours. Lipids No results for input(s): CHOL, TRIG, HDL, LABVLDL, LDLCALC, CHOLHDL in the last 168 hours.  Hematology Recent Labs  Lab 11/04/23 1759 11/04/23 1844  WBC 12.9*  --   RBC 5.19  --   HGB 14.1 15.0  HCT 41.0 44.0  MCV 79.0*  --   MCH 27.2  --   MCHC 34.4  --   RDW 16.4*  --   PLT 190  --    Thyroid  No results for input(s): TSH, FREET4 in the last 168 hours.  BNPNo results for input(s): BNP, PROBNP in the last 168 hours.  DDimer No results for input(s): DDIMER in the last 168 hours.  Radiology/Studies:  DG Chest 1 View Result Date: 11/04/2023 CLINICAL DATA:  Shortness of breath EXAM: CHEST  1 VIEW COMPARISON:  05/25/2022 FINDINGS: Hardware in the cervical spine. Post sternotomy changes. Mild low lung volume. No focal opacity, pleural effusion, or pneumothorax. Cardiomediastinal silhouette  within normal limits. Coronary stent. IMPRESSION: No active disease. Low lung volume. Electronically Signed   By: Luke Bun M.D.   On: 11/04/2023 18:24     Assessment and Plan:  # Mobitz II Second degree AV block:  # RBBB:  High degree AV block.  Plan is setting hyperglycemia and electrolyte abnormalities.  Potassium was 6.6 a couple days ago.  He did stop his potassium supplementation but has continued to take spironolactone .  Will await current labs before making any plans for a temporary wire.  He is minimally symptomatic when sitting in bed.  Both blood pressure and heart rates are trending down.  He is not currently receiving any IV medication.  Will start low-dose epinephrine  infusion.  Check BMP, magnesium , and TSH.  I suspect this will improve as his electrolytes and glucose improved.  Continue to hold metoprolol .  # HFmrEF:  LVEF 45%.  He appears to be euvolemic.  Holding GDMT in the setting of AKI, hypotension, and bradycardia.  # AKI: In the setting of hyperglycemia.  Hold Entresto , torsemide  metolazone ,, and spironolactone .  # CAD:  # PAD:  #  Hyperlipidemia:  Not an active issue.  Continue aspirin  clopidogrel .  Continue fenofibrate  and rosuvastatin .  Hold beta-blocker and Imdur  for now.  # Type 1 diabetes: # Hyperglycemia: Management per primary team.  He notes that his insulin  pump is almost out.  ED staff was notified.   Risk Assessment/Risk Scores:       New [place]  Heart Association (NYHA) Functional Class NYHA Class II   Total critical care time: 55 minutes. Critical care time was exclusive of separately billable procedures and treating other patients. Critical care was necessary to treat or prevent imminent or life-threatening deterioration. Critical care was time spent personally by me on the following activities: development of treatment plan with patient and/or surrogate as well as nursing, discussions with consultants, evaluation of patient's response to treatment, examination of patient, obtaining history from patient or surrogate, ordering and performing treatments and interventions, ordering and review of laboratory studies, ordering and review of radiographic studies, pulse oximetry and re-evaluation of patient's condition.     For questions or updates, please contact [facility] Please consult [email or web address] for contact info under    Signed, Annabella Scarce, MD  11/04/2023 7:00 PM

## 2023-11-04 NOTE — ED Notes (Signed)
 Given sandwich bag and diet lemon lime soda; diet order changed by CCM

## 2023-11-04 NOTE — ED Notes (Signed)
 Endotool has now recommended to put insulin  at 8u/hr. the patient is concerned because at baseline he's on 13.6u/hr on the insulin  pump. I asked pharmacist and he recommended 13-15 u/hr. Secure chat sent to CCM NP and [facility] RN

## 2023-11-04 NOTE — ED Notes (Signed)
 Pt ate 100% sandwich and 4oz applesauce

## 2023-11-04 NOTE — H&P (Signed)
 NAME:  Juan [name], MRN:  [id number], DOB:  02-21-1966, LOS: 0 ADMISSION DATE:  11/04/2023, CONSULTATION DATE:  11/04/23 REFERRING MD:  EDP, CHIEF COMPLAINT:  symptomatic bradycardia   History of Present Illness:  58 yo male with h/o t1dm with hyperglycemia on insulin  infusion U500 at baseline, presented with lethargy, weakness, pre syncope. Pt states that he was attempting to get by on an almost empty cartridge of his u500 insulin  pump when today began feeling weak and grayed out vision he thought perhaps he was hypoglycemic and took in apple juice to increase further his bs. When he arrived to the hospital his BS was >700 K >6 and he was in a 2nd degree av block with hypotension (80 sbp). Once epi was initiated pt immediately endorsed improvement in his symptoms. BP certainly improved but HR remains     20-40. Cardiology was consulted and recommended correcting elyte abnormalities initially to see if this resolves his presentation.   He was started on insulin  infusion, we have provided supplements to this as well as continuation of epi. Adding mag check to his f/u labs to see his K and glucose on BMP. Pt denies any recent illness, no recent n/v/d, fever/chills, headache. + blurred vision today at work and overall tremulous and lethargic, prompting his presentation.   Ccm was asked to admit for metabolic derangements as well as utilization of epi infusion while we sort thru his presentation and determine his ultimate cause of dysrrthmia +/- need for tvp vs ppm.   Pertinent  Medical History  T1dm with hyperglycemia HFrEF (45% LVEF with also reduced RV function) 3v cabg Hyperlipidemia Recurrent pancreatitis Pad s/p LLE bypass  Significant Hospital Events: Including procedures, antibiotic start and stop dates in addition to other pertinent events   Admitted to ICU with bradycardia 8/18  Interim History / Subjective:    Objective    Blood pressure (!) 124/46, pulse (!) 51, temperature  98.2 F (36.8 C), temperature source Oral, resp. rate 15, height 5' 11 (1.803 m), weight 102.3 kg, SpO2 97%.       No intake or output data in the 24 hours ending 11/04/23 2142 Filed Weights   11/04/23 1753 11/04/23 2100  Weight: 102.1 kg 102.3 kg    Examination: General: pale, ill appearing male laying supine in bed, mildly anxious HENT: ncat, eomi, perrla, mm dry and pale Lungs: ctab Cardiovascular: irreg and bradycardic  Abdomen: soft, nt, nd bs + Extremities: no c/c/e Neuro: tremulous, no focal deficits, appropriate and oriented GU: deferred.   Resolved problem list   Assessment and Plan  Symptomatic bradycardia Hyperglycemia with t1dm Hyperkalemia Pre-syncope  -check mag -recheck serial glucose and potassium -insulin  infusion increased appropriately but will also provide lantus  dosing with chronic u500 pump -monitor tele, with pads in place -cardiology aware and deferred tvp at this time -serial EKG's -npo with exception of h20 and ice chips.  -echo in am -supportive care otherwise    Best Practice (right click and Reselect all SmartList Selections daily)   Diet/type: NPO DVT prophylaxis prophylactic heparin   Pressure ulcer(s): present on admission  GI prophylaxis: N/A Lines: N/A Foley:  N/A Code Status:  full code Last date of multidisciplinary goals of care discussion [pt would like wife to make decisions for him should he not be able to]  Labs   CBC: Recent Labs  Lab 11/04/23 1759 11/04/23 1844  WBC 12.9*  --   NEUTROABS 8.0*  --   HGB 14.1 15.0  HCT  41.0 44.0  MCV 79.0*  --   PLT 190  --     Basic Metabolic Panel: Recent Labs  Lab 11/01/23 0945 11/04/23 1759 11/04/23 1844  NA 127* 126* 124*  K 6.6* 6.6* 6.4*  CL 93* 94* 95*  CO2 19* 17*  --   GLUCOSE 658* 686* >700*  BUN 55* 55* 69*  CREATININE 2.05* 3.10* 3.10*  CALCIUM  8.8* 8.3*  --    GFR: Estimated Creatinine Clearance: 32.4 mL/min (A) (by C-G formula based on SCr of 3.1  mg/dL (H)). Recent Labs  Lab 11/04/23 1759  WBC 12.9*    Liver Function Tests: No results for input(s): AST, ALT, ALKPHOS, BILITOT, PROT, ALBUMIN  in the last 168 hours. No results for input(s): LIPASE, AMYLASE in the last 168 hours. No results for input(s): AMMONIA in the last 168 hours.  ABG    Component Value Date/Time   PHART 7.330 (L) 07/28/2021 1539   PCO2ART 48.2 (H) 07/28/2021 1539   PO2ART 62 (L) 07/28/2021 1539   HCO3 25.4 07/28/2021 1539   TCO2 23 11/04/2023 1844   ACIDBASEDEF 1.0 07/28/2021 1539   O2SAT 89 07/28/2021 1539     Coagulation Profile: No results for input(s): INR, PROTIME in the last 168 hours.  Cardiac Enzymes: No results for input(s): CKTOTAL, CKMB, CKMBINDEX, TROPONINI in the last 168 hours.  HbA1C: Hgb A1c MFr Bld  Date/Time Value Ref Range Status  11/14/2021 08:47 AM 7.2 (H) 4.8 - 5.6 % Final    Comment:    (NOTE) Pre diabetes:          5.7%-6.4%  Diabetes:              >6.4%  Glycemic control for   <7.0% adults with diabetes   05/21/2019 04:00 PM 8.5 (H) 4.8 - 5.6 % Final    Comment:    (NOTE) Pre diabetes:          5.7%-6.4% Diabetes:              >6.4% Glycemic control for   <7.0% adults with diabetes     CBG: Recent Labs  Lab 11/04/23 1958 11/04/23 2029 11/04/23 2104 11/04/23 2131  GLUCAP >600* >600* >600* >600*    Review of Systems:   As per HPI  Past Medical History:  He,  has a past medical history of Abnormal cardiovascular stress test (02/06/2013), Anemia, Bilateral renal cysts (06/16/2011), Blood transfusion without reported diagnosis, CAD (coronary artery disease), LAD 90%, 1st diag 95%, LCX 70% wth AV groove 90%, RCA 40-50% mid and 80% long distal stenosis 02/03/13 (02/04/2013), Chest pain, CHF (congestive heart failure) (HCC), Coronary artery calcification seen on CAT scan, Diabetes mellitus, Fatty liver disease, nonalcoholic (06/16/2011), Hyperlipidemia, Hypertension, Lateral  meniscus tear (09/2011), Loose body in knee (09/2011), Non Hodgkin's lymphoma (HCC) (1991), Pancreatitis, Pneumonia, S/P CABG x 4, 02/05/13 LIMA-LAD; LT. RADIAL-OM;VG-DIAG; VG-PDA (02/06/2013), Sleep apnea, Splenomegaly, congestive, chronic, and Stuffy and runny nose (10/02/2011).   Surgical History:   Past Surgical History:  Procedure Laterality Date   ABDOMINAL AORTIC ANEURYSM REPAIR     ABDOMINAL AORTOGRAM W/LOWER EXTREMITY N/A 07/18/2021   Procedure: ABDOMINAL AORTOGRAM W/LOWER EXTREMITY;  Surgeon: Serene Gaile ORN, MD;  Location: [facility];  Service: Cardiovascular;  Laterality: N/A;   ANTERIOR CERVICAL DECOMP/DISCECTOMY FUSION N/A 03/26/2018   Procedure: ANTERIOR CERVICAL DECOMPRESSION FUSION CERVICAL 5-6 WITH INSTRUMENTATION AND ALLOGRAFT;  Surgeon: Beuford Anes, MD;  Location: [facility] OR;  Service: Orthopedics;  Laterality: N/A;   CARDIAC CATHETERIZATION  CORONARY ARTERY BYPASS GRAFT N/A 02/05/2013   Procedure: CORONARY ARTERY BYPASS GRAFTING (CABG);  Surgeon: Maude Fleeta Ochoa, MD;  Location[facility];  Service: Open Heart Surgery;  Laterality: N/A;  Coronary artery bypass graft on pump times four using left internal mammary artery and right greater saphenous vein via endovein harvest and left radial artery harvest.    CORONARY STENT INTERVENTION  12/17/2016    PCI and drug-eluting stenting of the mid and distal RCA SVG    CORONARY STENT INTERVENTION N/A 12/17/2016   Procedure: CORONARY STENT INTERVENTION;  Surgeon: Court Dorn PARAS, MD;  Location: [facility];  Service: Cardiovascular;  Laterality: N/A;   ELBOW SURGERY     FEMORAL-POPLITEAL BYPASS GRAFT Left 07/28/2021   Procedure: LEFT FEMORAL-BELOW KNEE POPLITEAL ARTERY BYPASS GRAFT;  Surgeon: Serene Gaile ORN, MD;  Location: [facility] OR;  Service: Vascular;  Laterality: Left;   HERNIA REPAIR     inguinal    herniated disc     INTRAOPERATIVE TRANSESOPHAGEAL ECHOCARDIOGRAM N/A 02/05/2013   Procedure: INTRAOPERATIVE  TRANSESOPHAGEAL ECHOCARDIOGRAM;  Surgeon: Maude Fleeta Ochoa, MD;  Location: [facility] OR;  Service: Open Heart Surgery;  Laterality: N/A;   KNEE ARTHROSCOPY  10/09/2011   Procedure: ARTHROSCOPY KNEE;  Surgeon: Eva Elsie Herring, MD;  Location: [facility];  Service: Orthopedics;  Laterality: Left;   LEFT HEART CATH AND CORS/GRAFTS ANGIOGRAPHY N/A 12/17/2016   Procedure: LEFT HEART CATH AND CORS/GRAFTS ANGIOGRAPHY;  Surgeon: Court Dorn PARAS, MD;  Location: [facility];  Service: Cardiovascular;  Laterality: N/A;   LEFT HEART CATHETERIZATION WITH CORONARY ANGIOGRAM N/A 02/03/2013   Procedure: LEFT HEART CATHETERIZATION WITH CORONARY ANGIOGRAM;  Surgeon: Dorn PARAS Court, MD;  Location[facility] CATH LAB;  Service: Cardiovascular;  Laterality: N/A;   NASAL SEPTUM SURGERY     PARATHYROIDECTOMY Right 11/23/2021   Procedure: RIGHT INFERIOR PARATHYROIDECTOMY;  Surgeon: Eletha Boas, MD;  Location: [facility] ORS;  Service: General;  Laterality: Right;   RADIAL ARTERY HARVEST Left 02/05/2013   Procedure: RADIAL ARTERY HARVEST;  Surgeon: Maude Fleeta Ochoa, MD;  Location: [facility] OR;  Service: Vascular;  Laterality: Left;   RIGHT/LEFT HEART CATH AND CORONARY/GRAFT ANGIOGRAPHY N/A 02/15/2020   Procedure: RIGHT/LEFT HEART CATH AND CORONARY/GRAFT ANGIOGRAPHY;  Surgeon: Court Dorn PARAS, MD;  Location: [facility];  Service: Cardiovascular;  Laterality: N/A;   TEE WITHOUT CARDIOVERSION N/A 02/18/2020   Procedure: TRANSESOPHAGEAL ECHOCARDIOGRAM (TEE);  Surgeon: Cherrie Toribio SAUNDERS, MD;  Location: [facility] ENDOSCOPY;  Service: Cardiovascular;  Laterality: N/A;   TIBIA BONE BIOPSY  x 3   left   TONSILLECTOMY     VEIN HARVEST Left 07/28/2021   Procedure: HARVEST OF GREAT SAPHENOUS VEIN;  Surgeon: Serene Gaile ORN, MD;  Location: [facility] OR;  Service: Vascular;  Laterality: Left;     Social History:   reports that he has never smoked. He has never been exposed to tobacco smoke. He has never used smokeless tobacco. He  reports that he does not drink alcohol  and does not use drugs.   Family History:  His family history is not on file. He was adopted.   Allergies Allergies  Allergen Reactions   Empagliflozin  Other (See Comments)    Caused skin infection   Sglt2 Inhibitors Other (See Comments)    Cellulitis with Jardiance  and Farxiga   Reglan [Metoclopramide] Other (See Comments)    Hyperactivity with higher doses (can tolerate lower doses)      Home Medications  Prior to Admission medications   Medication  Sig Start Date End Date Taking? Authorizing Provider  aspirin  81 MG chewable tablet Chew 1 tablet (81 mg total) by mouth daily. 12/19/16  Yes Henry Shaver B, NP  clopidogrel  (PLAVIX ) 75 MG tablet TAKE 1 TABLET BY MOUTH EVERY DAY 04/24/23  Yes Court Dorn PARAS, MD  fenofibrate  160 MG tablet Take 160 mg by mouth in the morning.   Yes [provider]  icosapent  Ethyl (VASCEPA ) 1 g capsule TAKE 2 CAPSULES BY MOUTH TWICE A DAY 09/24/23  Yes Bensimhon, Toribio SAUNDERS, MD  Insulin  Degludec (TRESIBA [place]) Inject 65 Units into the skin in the morning.   Yes [provider]  insulin  regular human CONCENTRATED (HUMULIN  R) 500 UNIT/ML injection Inject into the skin continuous. Pump setting 125 delivers up to 630 units (max daily dosage via pump)   Yes [provider]  isosorbide  mononitrate (IMDUR ) 30 MG 24 hr tablet TAKE 1 TABLET BY MOUTH EVERY DAY 02/05/23  Yes Hilty, Vinie BROCKS, MD  metFORMIN  (GLUCOPHAGE ) 1000 MG tablet Take 1,000 mg by mouth 2 (two) times daily with a meal.   Yes [provider]  metoprolol  succinate (TOPROL -XL) 25 MG 24 hr tablet TAKE 3 TABLETS BY MOUTH TWICE A DAY WITH OR IMMEDIATELY FOLLOWING A MEAL Patient taking differently: Take 75 mg by mouth 2 (two) times daily. 04/04/23  Yes Court Dorn PARAS, MD  nitroGLYCERIN  (NITROSTAT ) 0.4 MG SL tablet Place 1 tablet (0.4 mg total) under the tongue every 5 (five) minutes as needed. 09/27/23  Yes Milford, Harlene HERO, FNP   Propylene Glycol (SYSTANE BALANCE) 0.6 % SOLN Place 1 drop into both eyes 4 (four) times daily as needed (dry eyes).   Yes [provider]  rosuvastatin  (CRESTOR ) 5 MG tablet TAKE 1 TABLET (5 MG TOTAL) BY MOUTH DAILY. 08/05/23 07/30/24 Yes Hilty, Vinie BROCKS, MD  sacubitril -valsartan  (ENTRESTO ) 49-51 MG Take 1 tablet by mouth 2 (two) times daily. NEEDS FOLLOW UP APPOINTMENT FOR MORE REFILLS 12/20/22  Yes Bensimhon, Toribio SAUNDERS, MD  spironolactone  (ALDACTONE ) 25 MG tablet TAKE 1 TABLET BY MOUTH EVERY DAY 07/09/23  Yes Bensimhon, Toribio SAUNDERS, MD  torsemide  (DEMADEX ) 20 MG tablet Take 4 tablets (80 mg total) by mouth 2 (two) times daily. 10/18/23 01/16/24 Yes Milford, Harlene HERO, FNP  Vitamin D , Ergocalciferol , (DRISDOL) 1.25 MG (50000 UNIT) CAPS capsule Take 50,000 Units by mouth 3 (three) times a week.   Yes [provider]  Continuous Blood Gluc Receiver (DEXCOM G7 RECEIVER) DEVI by Does not apply route.    [provider]  Glucose Blood (BAYER CONTOUR NEXT TEST VI) glucose testing    [provider]  Semaglutide (OZEMPIC, 0.25 OR 0.5 MG/DOSE, [place]) Inject 0.5 mg into the skin once a week.    [provider]     Critical care time: 

## 2023-11-04 NOTE — ED Triage Notes (Addendum)
 [place] [facility] [place]. Pt had a near syncopal episode and when EMS arrived he was found to be in 2nd degree type 2 block with a HR of 20. Initial BP was 80/40. Pt remained A/Ox4 during transport.   EMS admin 800 mL of NaCl, 2 mg of atropine , and started an Epi drip at 4 mcg/ min.   Last EMS Vitals  BP 124/53  Epi d/c'd on arrival.

## 2023-11-04 NOTE — Progress Notes (Signed)
 eLink Physician-Brief Progress Note Patient Name: AJIT ERRICO DOB: 04-Mar-1966 MRN: [id number]   Date of Service  11/04/2023  HPI/Events of Note  57/M with history of CHF, DM, presents to the ED after having a near-syncopal episode. Initial evaluation  revealed pt to be bradycardic (HR 20s) and hypotensive. HE was started on atropine , epinerphrine and sent to the ED.  Subsequent evaluation revealed to by in a type II second degree AV block. Labs also showed pt to be hyperglycemic, with bicarb of 17 (although anion gap was 15), and potassium of 6.4 He was started on epinephrine , insulin  infusions and was admitted to the ICU  eICU Interventions  - Admit to ICU  - Maintain on continuous cardiac monitoring. - Titrate epinephrine  to target HR >50 - Continue IV insulin . Will transition to [facility] insulin  when glucose controlled, bicarb improved.  - Insulin  infusion should also help correct hyperkalemia.  - Pt also given calcium , bicarbonate, lasix  to help lower potassium levels.  - Will follow serial BMP.  - Currently receiving IVF. Will monitor IOs, respiratory status closely given history of CHF.          Ysmael Hires M DELA CRUZ 11/04/2023, 9:59 PM

## 2023-11-04 NOTE — ED Notes (Signed)
 Pharmacy tech at beside for med rec

## 2023-11-05 ENCOUNTER — Inpatient Hospital Stay (HOSPITAL_COMMUNITY)

## 2023-11-05 DIAGNOSIS — N179 Acute kidney failure, unspecified: Secondary | ICD-10-CM

## 2023-11-05 DIAGNOSIS — N1832 Chronic kidney disease, stage 3b: Secondary | ICD-10-CM | POA: Diagnosis not present

## 2023-11-05 DIAGNOSIS — I5022 Chronic systolic (congestive) heart failure: Secondary | ICD-10-CM | POA: Diagnosis not present

## 2023-11-05 DIAGNOSIS — E1010 Type 1 diabetes mellitus with ketoacidosis without coma: Secondary | ICD-10-CM | POA: Diagnosis not present

## 2023-11-05 DIAGNOSIS — R9431 Abnormal electrocardiogram [ECG] [EKG]: Secondary | ICD-10-CM | POA: Diagnosis not present

## 2023-11-05 DIAGNOSIS — E871 Hypo-osmolality and hyponatremia: Secondary | ICD-10-CM

## 2023-11-05 DIAGNOSIS — R001 Bradycardia, unspecified: Secondary | ICD-10-CM | POA: Diagnosis not present

## 2023-11-05 LAB — GLUCOSE, CAPILLARY
Glucose-Capillary: 116 mg/dL — ABNORMAL HIGH (ref 70–99)
Glucose-Capillary: 124 mg/dL — ABNORMAL HIGH (ref 70–99)
Glucose-Capillary: 125 mg/dL — ABNORMAL HIGH (ref 70–99)
Glucose-Capillary: 133 mg/dL — ABNORMAL HIGH (ref 70–99)
Glucose-Capillary: 147 mg/dL — ABNORMAL HIGH (ref 70–99)
Glucose-Capillary: 155 mg/dL — ABNORMAL HIGH (ref 70–99)
Glucose-Capillary: 168 mg/dL — ABNORMAL HIGH (ref 70–99)
Glucose-Capillary: 183 mg/dL — ABNORMAL HIGH (ref 70–99)
Glucose-Capillary: 184 mg/dL — ABNORMAL HIGH (ref 70–99)
Glucose-Capillary: 208 mg/dL — ABNORMAL HIGH (ref 70–99)
Glucose-Capillary: 208 mg/dL — ABNORMAL HIGH (ref 70–99)
Glucose-Capillary: 212 mg/dL — ABNORMAL HIGH (ref 70–99)
Glucose-Capillary: 228 mg/dL — ABNORMAL HIGH (ref 70–99)
Glucose-Capillary: 233 mg/dL — ABNORMAL HIGH (ref 70–99)
Glucose-Capillary: 241 mg/dL — ABNORMAL HIGH (ref 70–99)
Glucose-Capillary: 258 mg/dL — ABNORMAL HIGH (ref 70–99)
Glucose-Capillary: 296 mg/dL — ABNORMAL HIGH (ref 70–99)
Glucose-Capillary: 327 mg/dL — ABNORMAL HIGH (ref 70–99)
Glucose-Capillary: 375 mg/dL — ABNORMAL HIGH (ref 70–99)
Glucose-Capillary: 438 mg/dL — ABNORMAL HIGH (ref 70–99)
Glucose-Capillary: 458 mg/dL — ABNORMAL HIGH (ref 70–99)
Glucose-Capillary: 458 mg/dL — ABNORMAL HIGH (ref 70–99)
Glucose-Capillary: 461 mg/dL — ABNORMAL HIGH (ref 70–99)
Glucose-Capillary: 550 mg/dL (ref 70–99)
Glucose-Capillary: 556 mg/dL (ref 70–99)

## 2023-11-05 LAB — MAGNESIUM: Magnesium: 2.2 mg/dL (ref 1.7–2.4)

## 2023-11-05 LAB — ECHOCARDIOGRAM COMPLETE
AR max vel: 2.55 cm2
AV Area VTI: 2.41 cm2
AV Area mean vel: 2.42 cm2
AV Mean grad: 5.5 mmHg
AV Peak grad: 11 mmHg
Ao pk vel: 1.66 m/s
Area-P 1/2: 4.89 cm2
Calc EF: 49.2 %
Height: 71 in
S' Lateral: 3.5 cm
Single Plane A2C EF: 52.8 %
Single Plane A4C EF: 51.3 %
Weight: 3612.02 [oz_av]

## 2023-11-05 LAB — BASIC METABOLIC PANEL WITH GFR
Anion gap: 14 (ref 5–15)
Anion gap: 13 (ref 5–15)
Anion gap: 13 (ref 5–15)
BUN: 52 mg/dL — ABNORMAL HIGH (ref 6–20)
BUN: 48 mg/dL — ABNORMAL HIGH (ref 6–20)
BUN: 37 mg/dL — ABNORMAL HIGH (ref 6–20)
CO2: 20 mmol/L — ABNORMAL LOW (ref 22–32)
CO2: 22 mmol/L (ref 22–32)
CO2: 23 mmol/L (ref 22–32)
Calcium: 8.9 mg/dL (ref 8.9–10.3)
Calcium: 8.7 mg/dL — ABNORMAL LOW (ref 8.9–10.3)
Calcium: 8.6 mg/dL — ABNORMAL LOW (ref 8.9–10.3)
Chloride: 95 mmol/L — ABNORMAL LOW (ref 98–111)
Chloride: 96 mmol/L — ABNORMAL LOW (ref 98–111)
Chloride: 97 mmol/L — ABNORMAL LOW (ref 98–111)
Creatinine, Ser: 2.54 mg/dL — ABNORMAL HIGH (ref 0.61–1.24)
Creatinine, Ser: 2.11 mg/dL — ABNORMAL HIGH (ref 0.61–1.24)
Creatinine, Ser: 1.55 mg/dL — ABNORMAL HIGH (ref 0.61–1.24)
GFR, Estimated: 29 mL/min — ABNORMAL LOW (ref >60)
GFR, Estimated: 36 mL/min — ABNORMAL LOW (ref >60)
GFR, Estimated: 52 mL/min — ABNORMAL LOW (ref >60)
Glucose, Bld: 436 mg/dL — ABNORMAL HIGH (ref 70–99)
Glucose, Bld: 212 mg/dL — ABNORMAL HIGH (ref 70–99)
Glucose, Bld: 121 mg/dL — ABNORMAL HIGH (ref 70–99)
Potassium: 4 mmol/L (ref 3.5–5.1)
Potassium: 3.5 mmol/L (ref 3.5–5.1)
Potassium: 3.7 mmol/L (ref 3.5–5.1)
Sodium: 129 mmol/L — ABNORMAL LOW (ref 135–145)
Sodium: 131 mmol/L — ABNORMAL LOW (ref 135–145)
Sodium: 133 mmol/L — ABNORMAL LOW (ref 135–145)

## 2023-11-05 LAB — HEMOGLOBIN A1C
Hgb A1c MFr Bld: 11.2 % — ABNORMAL HIGH (ref 4.8–5.6)
Mean Plasma Glucose: 274.74 mg/dL

## 2023-11-05 LAB — CBC
HCT: 38.3 % — ABNORMAL LOW (ref 39.0–52.0)
Hemoglobin: 13.3 g/dL (ref 13.0–17.0)
MCH: 26.1 pg (ref 26.0–34.0)
MCHC: 34.7 g/dL (ref 30.0–36.0)
MCV: 75.1 fL — ABNORMAL LOW (ref 80.0–100.0)
Platelets: 130 K/uL — ABNORMAL LOW (ref 150–400)
RBC: 5.1 MIL/uL (ref 4.22–5.81)
RDW: 16.1 % — ABNORMAL HIGH (ref 11.5–15.5)
WBC: 11.8 K/uL — ABNORMAL HIGH (ref 4.0–10.5)
nRBC: 0 % (ref 0.0–0.2)

## 2023-11-05 LAB — TROPONIN I (HIGH SENSITIVITY): Troponin I (High Sensitivity): 298 ng/L (ref <18)

## 2023-11-05 LAB — PHOSPHORUS: Phosphorus: 5.9 mg/dL — ABNORMAL HIGH (ref 2.5–4.6)

## 2023-11-05 LAB — HIV ANTIBODY (ROUTINE TESTING W REFLEX): HIV Screen 4th Generation wRfx: NONREACTIVE

## 2023-11-05 MED ORDER — ROSUVASTATIN CALCIUM 5 MG PO TABS
5.0000 mg | ORAL_TABLET | Freq: Every day | ORAL | Status: DC
  Administered 2023-11-05 – 2023-11-07 (×3): 5 mg via ORAL
  Filled 2023-11-05 (×3): qty 1

## 2023-11-05 MED ORDER — ASPIRIN 81 MG PO CHEW
81.0000 mg | CHEWABLE_TABLET | Freq: Every day | ORAL | Status: DC
  Administered 2023-11-05 – 2023-11-07 (×3): 81 mg via ORAL
  Filled 2023-11-05 (×3): qty 1

## 2023-11-05 MED ORDER — CLOPIDOGREL BISULFATE 75 MG PO TABS
75.0000 mg | ORAL_TABLET | Freq: Every day | ORAL | Status: DC
  Administered 2023-11-06 – 2023-11-07 (×2): 75 mg via ORAL
  Filled 2023-11-05 (×2): qty 1

## 2023-11-05 NOTE — Consult Note (Addendum)
 Advanced Heart Failure Team Consult Note   Primary Physician: Elliot Charm, MD Cardiologist:  Dorn Lesches, MD AHF: Dr. Cherrie   Reason for Consultation: symptomatic bradycardia   HPI:    Juan [name] is seen today for evaluation of symptomatic bradycardia at the request of Dr. Gretta, CCM.   58 y.o. male w/ h/o systolic HF, EF 45%, 3VCAD s/p CABG in 2014 followed by stenting to SVG-RCA in 2018, DM2 on insulin  pump, severe hypertriglyceridemia w/ subsequent episodes of pancreatitis and PAD s.p LLE bypass graft.   Seen by Dr. Lesches 11/21 for progressive dyspnea and wt gain which was being managed by increasing home diuretics w/o much improvement in volume or symptoms. Echo repeated showing stable LVEF at 45%. RV systolic function low normal. Mild-mod MR also noted. Given persistent symptoms, he had LHC that showed progression of his native and graft disease.  All vein grafts are functionally occluded with minimal left to right collaterals.  His LIMA is patent although his LAD is diffusely diseased.  RHC showed mPCWP mildly elevated at 19. LVEDP 23. CO by Fick 3.25, CI 1.48, by thermo calculation CO 7, CI 3.2.   Echo 4/22 EF 50-55% mild MR    Echo 05/22/21 showed EF 50%, mild LVH, RV mildly reduced, aortic root dilation 37 mm   Echo 12/24: EF 45-50%, G2DD, mildly reduced RV  Admitted yesterday for DKA w/ subsequent bradycardia and transient heart block, in the setting of metabolic abnormalities. Glucose 700. K 6.4. Also on Toprol  XL 75mg  daily, last dose was morning of admission.   Admitted to ICU and placed on insulin  gtt. Zoll pads placed but has not required transcutaneous pacing.   He is feeling better this morning. Glucose improving, now in 200s. K 3.5.   Conduction improving, remains bradycardic but HR now in the upper 40s. No further high grade blocks.   BP normotensive. Denies CP, dyspnea. No dizziness, syncope/ near syncope.   Hs trop 40>>90>>261. Hgb A1c  11.2   Echo pending    Home Medications Prior to Admission medications   Medication Sig Start Date End Date Taking? Authorizing Provider  aspirin  81 MG chewable tablet Chew 1 tablet (81 mg total) by mouth daily. 12/19/16  Yes Henry Shaver B, NP  clopidogrel  (PLAVIX ) 75 MG tablet TAKE 1 TABLET BY MOUTH EVERY DAY 04/24/23  Yes Lesches Dorn PARAS, MD  fenofibrate  160 MG tablet Take 160 mg by mouth in the morning.   Yes [provider]  icosapent  Ethyl (VASCEPA ) 1 g capsule TAKE 2 CAPSULES BY MOUTH TWICE A DAY 09/24/23  Yes Shyhiem Beeney, Toribio SAUNDERS, MD  Insulin  Degludec (TRESIBA [place]) Inject 65 Units into the skin in the morning.   Yes [provider]  insulin  regular human CONCENTRATED (HUMULIN  R) 500 UNIT/ML injection Inject into the skin continuous. Pump setting 125 delivers up to 630 units (max daily dosage via pump)   Yes [provider]  isosorbide  mononitrate (IMDUR ) 30 MG 24 hr tablet TAKE 1 TABLET BY MOUTH EVERY DAY 02/05/23  Yes Hilty, Vinie BROCKS, MD  metFORMIN  (GLUCOPHAGE ) 1000 MG tablet Take 1,000 mg by mouth 2 (two) times daily with a meal.   Yes [provider]  metoprolol  succinate (TOPROL -XL) 25 MG 24 hr tablet TAKE 3 TABLETS BY MOUTH TWICE A DAY WITH OR IMMEDIATELY FOLLOWING A MEAL Patient taking differently: Take 75 mg by mouth 2 (two) times daily. 04/04/23  Yes Lesches Dorn PARAS, MD  nitroGLYCERIN  (NITROSTAT ) 0.4 MG SL tablet  Place 1 tablet (0.4 mg total) under the tongue every 5 (five) minutes as needed. 09/27/23  Yes Milford, Harlene HERO, FNP  Propylene Glycol (SYSTANE BALANCE) 0.6 % SOLN Place 1 drop into both eyes 4 (four) times daily as needed (dry eyes).   Yes [provider]  rosuvastatin  (CRESTOR ) 5 MG tablet TAKE 1 TABLET (5 MG TOTAL) BY MOUTH DAILY. 08/05/23 07/30/24 Yes Hilty, Vinie BROCKS, MD  sacubitril -valsartan  (ENTRESTO ) 49-51 MG Take 1 tablet by mouth 2 (two) times daily. NEEDS FOLLOW UP APPOINTMENT FOR MORE REFILLS 12/20/22  Yes  Bettymae Yott, Toribio SAUNDERS, MD  spironolactone  (ALDACTONE ) 25 MG tablet TAKE 1 TABLET BY MOUTH EVERY DAY 07/09/23  Yes Buford Gayler, Toribio SAUNDERS, MD  torsemide  (DEMADEX ) 20 MG tablet Take 4 tablets (80 mg total) by mouth 2 (two) times daily. 10/18/23 01/16/24 Yes Milford, Harlene HERO, FNP  Vitamin D , Ergocalciferol , (DRISDOL) 1.25 MG (50000 UNIT) CAPS capsule Take 50,000 Units by mouth 3 (three) times a week.   Yes [provider]  Continuous Blood Gluc Receiver (DEXCOM G7 RECEIVER) DEVI by Does not apply route.    [provider]  Glucose Blood (BAYER CONTOUR NEXT TEST VI) glucose testing    [provider]  Semaglutide (OZEMPIC, 0.25 OR 0.5 MG/DOSE, [place]) Inject 0.5 mg into the skin once a week.    [provider]    Past Medical History: Past Medical History:  Diagnosis Date   Abnormal cardiovascular stress test 02/06/2013   Anemia    occasional - no problems currently(10/02/2011)   Bilateral renal cysts 06/16/2011   states no known problems   Blood transfusion without reported diagnosis    CAD (coronary artery disease), LAD 90%, 1st diag 95%, LCX 70% wth AV groove 90%, RCA 40-50% mid and 80% long distal stenosis 02/03/13 02/04/2013   Chest pain    positive Myoview  stress test   CHF (congestive heart failure) (HCC)    Coronary artery calcification seen on CAT scan    Diabetes mellitus    IDDM   Fatty liver disease, nonalcoholic 06/16/2011   Hyperlipidemia    Hypertension    states no dx. of HTN, takes med. to protect kidneys due to DM   Lateral meniscus tear 09/2011   left   Loose body in knee 09/2011   loose bodies left knee   Non Hodgkin's lymphoma (HCC) 1991   Pancreatitis    occasional - last episode 06/2011   Pneumonia    S/P CABG x 4, 02/05/13 LIMA-LAD; LT. RADIAL-OM;VG-DIAG; VG-PDA 02/06/2013   10/18 3/4 patent grafts (occluded SVG-->Diag), PCI/DESx 3 SVG-->RCA, normal EF   Sleep apnea    mild per patient   Splenomegaly, congestive, chronic    Stuffy  and runny nose 10/02/2011   yellow drainage from nose    Past Surgical History: Past Surgical History:  Procedure Laterality Date   ABDOMINAL AORTIC ANEURYSM REPAIR     ABDOMINAL AORTOGRAM W/LOWER EXTREMITY N/A 07/18/2021   Procedure: ABDOMINAL AORTOGRAM W/LOWER EXTREMITY;  Surgeon: Serene Gaile ORN, MD;  Location: [facility];  Service: Cardiovascular;  Laterality: N/A;   ANTERIOR CERVICAL DECOMP/DISCECTOMY FUSION N/A 03/26/2018   Procedure: ANTERIOR CERVICAL DECOMPRESSION FUSION CERVICAL 5-6 WITH INSTRUMENTATION AND ALLOGRAFT;  Surgeon: Beuford Anes, MD;  Location: [facility] OR;  Service: Orthopedics;  Laterality: N/A;   CARDIAC CATHETERIZATION     CORONARY ARTERY BYPASS GRAFT N/A 02/05/2013   Procedure: CORONARY ARTERY BYPASS GRAFTING (CABG);  Surgeon: Maude Fleeta Ochoa, MD;  Location: [facility] OR;  Service: Open  Heart Surgery;  Laterality: N/A;  Coronary artery bypass graft on pump times four using left internal mammary artery and right greater saphenous vein via endovein harvest and left radial artery harvest.    CORONARY STENT INTERVENTION  12/17/2016    PCI and drug-eluting stenting of the mid and distal RCA SVG    CORONARY STENT INTERVENTION N/A 12/17/2016   Procedure: CORONARY STENT INTERVENTION;  Surgeon: Court Dorn PARAS, MD;  Location: [facility];  Service: Cardiovascular;  Laterality: N/A;   ELBOW SURGERY     FEMORAL-POPLITEAL BYPASS GRAFT Left 07/28/2021   Procedure: LEFT FEMORAL-BELOW KNEE POPLITEAL ARTERY BYPASS GRAFT;  Surgeon: Serene Gaile ORN, MD;  Location: [facility] OR;  Service: Vascular;  Laterality: Left;   HERNIA REPAIR     inguinal    herniated disc     INTRAOPERATIVE TRANSESOPHAGEAL ECHOCARDIOGRAM N/A 02/05/2013   Procedure: INTRAOPERATIVE TRANSESOPHAGEAL ECHOCARDIOGRAM;  Surgeon: Maude Fleeta Ochoa, MD;  Location[facility] OR;  Service: Open Heart Surgery;  Laterality: N/A;   KNEE ARTHROSCOPY  10/09/2011   Procedure: ARTHROSCOPY KNEE;  Surgeon: Eva Elsie Herring, MD;   Location: [facility];  Service: Orthopedics;  Laterality: Left;   LEFT HEART CATH AND CORS/GRAFTS ANGIOGRAPHY N/A 12/17/2016   Procedure: LEFT HEART CATH AND CORS/GRAFTS ANGIOGRAPHY;  Surgeon: Court Dorn PARAS, MD;  [facility]: [facility];  Service: Cardiovascular;  Laterality: N/A;   LEFT HEART CATHETERIZATION WITH CORONARY ANGIOGRAM N/A 02/03/2013   Procedure: LEFT HEART CATHETERIZATION WITH CORONARY ANGIOGRAM;  Surgeon: Dorn PARAS Court, MD;  Location: [facility] CATH LAB;  Service: Cardiovascular;  Laterality: N/A;   NASAL SEPTUM SURGERY     PARATHYROIDECTOMY Right 11/23/2021   Procedure: RIGHT INFERIOR PARATHYROIDECTOMY;  Surgeon: Eletha Boas, MD;  Location: [facility] ORS;  Service: General;  Laterality: Right;   RADIAL ARTERY HARVEST Left 02/05/2013   Procedure: RADIAL ARTERY HARVEST;  Surgeon: Maude Fleeta Ochoa, MD;  Location: [facility] OR;  Service: Vascular;  Laterality: Left;   RIGHT/LEFT HEART CATH AND CORONARY/GRAFT ANGIOGRAPHY N/A 02/15/2020   Procedure: RIGHT/LEFT HEART CATH AND CORONARY/GRAFT ANGIOGRAPHY;  Surgeon: Court Dorn PARAS, MD;  [facility]: [facility];  Service: Cardiovascular;  Laterality: N/A;   TEE WITHOUT CARDIOVERSION N/A 02/18/2020   Procedure: TRANSESOPHAGEAL ECHOCARDIOGRAM (TEE);  Surgeon: Cherrie Toribio SAUNDERS, MD;  Location: [facility] ENDOSCOPY;  Service: Cardiovascular;  Laterality: N/A;   TIBIA BONE BIOPSY  x 3   left   TONSILLECTOMY     VEIN HARVEST Left 07/28/2021   Procedure: HARVEST OF GREAT SAPHENOUS VEIN;  Surgeon: Serene Gaile ORN, MD;  Location: [facility] OR;  Service: Vascular;  Laterality: Left;    Family History: Family History  Adopted: Yes    Social History: Social History   Socioeconomic History   Marital status: Married    Spouse name: Research scientist (physical sciences)   Number of children: 1   Years of education: 16   Highest education level: Not on file  Occupational History   Occupation: Biochemist, clinical     Employer: [employer]  Tobacco Use    Smoking status: Never    Passive exposure: Never   Smokeless tobacco: Never  Vaping Use   Vaping status: Never Used  Substance and Sexual Activity   Alcohol  use: No   Drug use: No   Sexual activity: Yes  Other Topics Concern   Not on file  Social History Narrative   Adopted, so no pertinent FH.  Married.  Lives with wife in [place].  Independent of ADLs and ambulation.  Social Drivers of Corporate investment banker Strain: Not on file  Food Insecurity: No Food Insecurity (11/04/2023)   Hunger Vital Sign    Worried About Running Out of Food in the Last Year: Never true    Ran Out of Food in the Last Year: Never true  Transportation Needs: Not on file  Physical Activity: Not on file  Stress: Not on file  Social Connections: Not on file    Allergies:  Allergies  Allergen Reactions   Empagliflozin  Other (See Comments)    Caused skin infection   Sglt2 Inhibitors Other (See Comments)    Cellulitis with Jardiance  and Farxiga   Reglan [Metoclopramide] Other (See Comments)    Hyperactivity with higher doses (can tolerate lower doses)     Objective:    Vital Signs:   Temp:  [97.3 F (36.3 C)-98.4 F (36.9 C)] 97.9 F (36.6 C) (08/19 0414) Pulse Rate:  [29-103] 35 (08/19 0700) Resp:  [13-29] 18 (08/19 0700) BP: (105-158)/(45-78) 121/50 (08/19 0700) SpO2:  [88 %-100 %] 93 % (08/19 0700) Weight:  [102.1 kg-102.4 kg] 102.4 kg (08/19 0400) Last BM Date : 11/04/23  Weight change: Filed Weights   11/04/23 1753 11/04/23 2100 11/05/23 0400  Weight: 102.1 kg 102.3 kg 102.4 kg    Intake/Output:   Intake/Output Summary (Last 24 hours) at 11/05/2023 0729 Last data filed at 11/05/2023 0700 Gross per 24 hour  Intake 1589.19 ml  Output 1550 ml  Net 39.19 ml      Physical Exam    General:  fatigued appearing, no distress  HEENT: normal Neck: JVP not elevated  Cor: regular rhythm, slow rate  Lungs: clear Abdomen: soft, nontender, nondistended.  Extremities: no LEE   Neuro: alert & orientedx3, cranial nerves grossly intact. moves all 4 extremities w/o difficulty. Affect pleasant   Telemetry   Transient bradycardia and 2nd degree AVB overnight, HR in the 30s, now improving, currently SB upper 40s. No further dropped beats.   EKG    No new EKG to review   Labs   Basic Metabolic Panel: Recent Labs  Lab 11/01/23 0945 11/04/23 1759 11/04/23 1844 11/04/23 2207 11/05/23 0248  NA 127* 126* 124* 126* 129*  K 6.6* 6.6* 6.4* 4.6 4.0  CL 93* 94* 95* 92* 95*  CO2 19* 17*  --  20* 20*  GLUCOSE 658* 686* >700* 699* 436*  BUN 55* 55* 69* 54* 52*  CREATININE 2.05* 3.10* 3.10* 2.81* 2.54*  CALCIUM  8.8* 8.3*  --  8.5* 8.9  MG  --   --   --  2.4 2.2  PHOS  --   --   --   --  5.9*    Liver Function Tests: No results for input(s): AST, ALT, ALKPHOS, BILITOT, PROT, ALBUMIN  in the last 168 hours. No results for input(s): LIPASE, AMYLASE in the last 168 hours. No results for input(s): AMMONIA in the last 168 hours.  CBC: Recent Labs  Lab 11/04/23 1759 11/04/23 1844 11/05/23 0248  WBC 12.9*  --  11.8*  NEUTROABS 8.0*  --   --   HGB 14.1 15.0 13.3  HCT 41.0 44.0 38.3*  MCV 79.0*  --  75.1*  PLT 190  --  130*    Cardiac Enzymes: No results for input(s): CKTOTAL, CKMB, CKMBINDEX, TROPONINI in the last 168 hours.  BNP: BNP (last 3 results) Recent Labs    09/27/23 0955 10/18/23 1017 11/04/23 1800  BNP 310.4* 211.2* 234.5*    ProBNP (last 3 results)  No results for input(s): PROBNP in the last 8760 hours.   CBG: Recent Labs  Lab 11/05/23 0259 11/05/23 0359 11/05/23 0501 11/05/23 0605 11/05/23 0651  GLUCAP 375* 327* 296* 258* 241*    Coagulation Studies: No results for input(s): LABPROT, INR in the last 72 hours.   Imaging   DG Chest 1 View Result Date: 11/04/2023 CLINICAL DATA:  Shortness of breath EXAM: CHEST  1 VIEW COMPARISON:  05/25/2022 FINDINGS: Hardware in the cervical spine. Post  sternotomy changes. Mild low lung volume. No focal opacity, pleural effusion, or pneumothorax. Cardiomediastinal silhouette within normal limits. Coronary stent. IMPRESSION: No active disease. Low lung volume. Electronically Signed   By: Luke Bun M.D.   On: 11/04/2023 18:24     Medications:     Current Medications:  Chlorhexidine  Gluconate Cloth  6 each Topical Daily   heparin   5,000 Units Subcutaneous Q8H    Infusions:  dextrose  5% lactated ringers  125 mL/hr at 11/05/23 0700   epinephrine  Stopped (11/05/23 0654)   insulin  16 Units/hr (11/05/23 0700)   lactated ringers  Stopped (11/05/23 9346)      Patient Profile   58 y/o male w/ chronic systolic HF, EF 45%, 3VCAD s/p CABG in 2014 followed by stenting to SVG-RCA in 2018, DM2 on insulin  pump, severe hypertriglyceridemia w/ subsequent episodes of pancreatitis and PAD s.p LLE bypass graft admitted to CCU for symptomatic bradycardia/2nd degree AVB in the setting of DKA and metabolic derangement.   Assessment/Plan   1. DKA/ Poorly Controlled T2DM - admit Gluc 700, Hgb A1c 11.2  - on insulin  gtt per CCM, improving Gluc 200s - LR infusion now stopped  - needs to improve glycemic control. D/w pt   2. Bradycardia/ 2nd Deg AVR - in setting of metabolic derangement, Initial K 6.4. Also on ? blocker therapy PTA (Toprol  XL 75 mg daily, last dose was AM of admit) - improving w/ correction of hyperkalemia, K now 3.5. HR now upper 40s. Asymptomatic  - continue Zoll pads for now - doubt he will need PPM. Will continue to watch  - continue to hold ? blocker  3. Hyperkalemia - 2/2 DKA, corrected w/ insulin . 6.4>>3.5   4. Chronic Systolic Heart Failure  - Echo 2017 showed normal LVEF 60-65% - Echo 4/21, EF mildly reduced 45%. NST showed scar but no ischemia - Echo 11/21, EF 45%, RV systolic function low normal  - RHC 11/21 showed mildly elevated filling pressures, PCWP 19. LVEDP 23. CO by Fick 3.25, CI 1.48, by thermo calculation CO  7, CI 3.2.  - Echo 4/22 50-55%  - Echo 3/23, 05/22/21 EF 50%  - Echo 12/24: EF 45-50% - Euvolemic on exam. NYHA II - Echo pending  - holding HF GDMT for now. Can try to add back spiro and entresto  as he continues to improve and prior to d/c - no SGLT2i given poor glycemic control/ DKA    5. CAD - s/p CABG in 2014, followed by PCI to SVG-RCA in 2018 - LHC 12/21 showed progression of native vessel CAD and grafts. All vein grafts are functionally occluded with minimal L->R collaterals.  His LIMA is patent although his LAD is diffusely diseased (diabetic). No targets for intervention and not a candidate for redo CABG. - denies CP. Hs trop 40>>90>>261. Suspect demand ischemia but check repeat to ensure downward trend  - echo pending  - continue ASA + Plavix  and statin     Length of Stay: 1  Brittainy Simmons, PA-C  11/05/2023, 7:29  AM  Advanced Heart Failure Team Pager [phone] (M-F; 7a - 5p)  Please contact [facility] for night-coverage after hours (4p -7a ) and weekends on [email or web address]  Patient seen and examined with the above-signed Advanced Practice Provider and/or Housestaff. I personally reviewed laboratory data, imaging studies and relevant notes. I independently examined the patient and formulated the important aspects of the plan. I have edited the note to reflect any of my changes or salient points. I have personally discussed the plan with the patient and/or family.  58 y/o male was above with DM1, CAD, HFrEF admitted with hyperkalemia and symptomatic bradycardia in setting of DKA  HgBa1c 11.2  Now improved with treatment of DKA. HRs in high 40s. BP stable   General:  Sitting up in bed No resp difficulty HEENT: normal Neck: supple. no JVD.  Cor: Regular rate & rhythm. No rubs, gallops or murmurs. Lungs: clear Abdomen: soft, nontender, nondistended. No hepatosplenomegaly. No bruits or masses. Good bowel sounds. Extremities: no cyanosis, clubbing, rash, LLE chronic edema  None on R Neuro: alert & orientedx3, cranial nerves grossly intact. moves all 4 extremities w/o difficulty. Affect pleasant  Symptomatic brady related to hyperkalemia in setting of DKA. Ga now closed and potassium down. No further pauses. Remains bradycardic. Continue to hold b-blocker. D/w CCM at bedside.  Toribio Fuel, MD  10:36 PM

## 2023-11-05 NOTE — Progress Notes (Signed)
 NAME:  CARLESTER KASPAREK, MRN:  [id number], DOB:  [birth year], LOS: 1 ADMISSION DATE:  11/04/2023, CONSULTATION DATE:  11/04/23 REFERRING MD:  EDP, CHIEF COMPLAINT:  symptomatic bradycardia   History of Present Illness:  58 yo male with h/o t1dm with hyperglycemia on insulin  infusion U500 at baseline, presented with lethargy, weakness, pre syncope. Pt states that he was attempting to get by on an almost empty cartridge of his u500 insulin  pump when today began feeling weak and grayed out vision he thought perhaps he was hypoglycemic and took in apple juice to increase further his bs. When he arrived to the hospital his BS was >700 K >6 and he was in a 2nd degree av block with hypotension (80 sbp). Once epi was initiated pt immediately endorsed improvement in his symptoms. BP certainly improved but HR remains     20-40. Cardiology was consulted and recommended correcting elyte abnormalities initially to see if this resolves his presentation.   He was started on insulin  infusion, we have provided supplements to this as well as continuation of epi. Adding mag check to his f/u labs to see his K and glucose on BMP. Pt denies any recent illness, no recent n/v/d, fever/chills, headache. + blurred vision today at work and overall tremulous and lethargic, prompting his presentation.   CCM was asked to admit for metabolic derangements as well as utilization of epi infusion while we sort thru his presentation and determine his ultimate cause of dysrrthmia +/- need for tvp vs ppm.   Pertinent  Medical History  T1dm with hyperglycemia HFrEF (45% LVEF with also reduced RV function) 3v cabg Hyperlipidemia Recurrent pancreatitis Pad s/p LLE bypass  Significant Hospital Events: Including procedures, antibiotic start and stop dates in addition to other pertinent events   Admitted to ICU with bradycardia 8/18  Interim History / Subjective:  HR remained in 30s overnight on epinephrine  infusion. He has not been  checking BG due to   Objective    Blood pressure (!) 121/50, pulse (!) 35, temperature 97.9 F (36.6 C), temperature source Oral, resp. rate 18, height 5' 11 (1.803 m), weight 102.4 kg, SpO2 93%.        Intake/Output Summary (Last 24 hours) at 11/05/2023 0726 Last data filed at 11/05/2023 0700 Gross per 24 hour  Intake 1589.19 ml  Output 1550 ml  Net 39.19 ml   Filed Weights   11/04/23 1753 11/04/23 2100 11/05/23 0400  Weight: 102.1 kg 102.3 kg 102.4 kg    Examination: General: chronically ill appearing man lying in bed in NAD HENT: [place]/AT, eyes anicteric Lungs: breathing comfortably on RA, CTAB Cardiovascular: bradycardic in high 40s, S1S2 Abdomen: soft, NT Extremities: chronic edema and scarring LLE, less edema RLE Neuro: awake, alert, moving all extremities  Na+ 129 K+ 4.0 Bicarb 20, AG 14 BUN 52 Cr 2.54 Trop 216  Resolved problem list   Assessment and Plan  Symptomatic bradycardia- likely due to metabolic dysregulation and residual Bblocker -hold metoprolol  -off epinephrine  -echo pending -tele monitoring -appreciate cardiology's management -holding plavix  in case her required a PPM; last dose was 8/18 AM  DKA, DM1, very severe insulin  resistance. Suspect leptin deficiency.  Uncontrolled DM with A1c >11 -DM coordinator consult -con't IV insulin  until able to transition off, then can go to pump with U500 + U300 long acting insulin  -needs better OP control> seems he hasn't been checking his BG due to lack of test strips  Hyperkalemia, resolved -monitor  Pre-syncope due to bradycardia -supportive care  Hyponatremia, correcting -monitor  AKI on CKD 3b, likely related to DKA and hypotension at presentation  -strict I/O -renally dose meds, avoid nephrotoxic meds  Patient and wife updated at bedside.   Best Practice (right click and Reselect all SmartList Selections daily)   Diet/type: clear liquids DVT prophylaxis prophylactic heparin   Pressure  ulcer(s): present on admission  GI prophylaxis: N/A Lines: N/A Foley:  N/A Code Status:  full code Last date of multidisciplinary goals of care discussion [pt would like wife to make decisions for him should he not be able to]  Labs   CBC: Recent Labs  Lab 11/04/23 1759 11/04/23 1844 11/05/23 0248  WBC 12.9*  --  11.8*  NEUTROABS 8.0*  --   --   HGB 14.1 15.0 13.3  HCT 41.0 44.0 38.3*  MCV 79.0*  --  75.1*  PLT 190  --  130*    Basic Metabolic Panel: Recent Labs  Lab 11/01/23 0945 11/04/23 1759 11/04/23 1844 11/04/23 2207 11/05/23 0248  NA 127* 126* 124* 126* 129*  K 6.6* 6.6* 6.4* 4.6 4.0  CL 93* 94* 95* 92* 95*  CO2 19* 17*  --  20* 20*  GLUCOSE 658* 686* >700* 699* 436*  BUN 55* 55* 69* 54* 52*  CREATININE 2.05* 3.10* 3.10* 2.81* 2.54*  CALCIUM  8.8* 8.3*  --  8.5* 8.9  MG  --   --   --  2.4 2.2  PHOS  --   --   --   --  5.9*    Critical care time:      This patient is critically ill with multiple organ system failure which requires frequent high complexity decision making, assessment, support, evaluation, and titration of therapies. This was completed through the application of advanced monitoring technologies and extensive interpretation of multiple databases. During this encounter critical care time was devoted to patient care services described in this note for 33 minutes.  Leita SHAUNNA Gaskins, DO 11/05/23 11:56 AM [facility]  For contact information, see Amion. If no response to pager, please call PCCM consult pager. After hours, 7PM- 7AM, please call Elink.

## 2023-11-05 NOTE — Inpatient Diabetes Management (Addendum)
 Inpatient Diabetes Program Recommendations  AACE/ADA: New Consensus Statement on Inpatient Glycemic Control (2015)  Target Ranges:  Prepandial:   less than 140 mg/dL      Peak postprandial:   less than 180 mg/dL (1-2 hours)      Critically ill patients:  140 - 180 mg/dL   Lab Results  Component Value Date   GLUCAP 233 (H) 11/05/2023   HGBA1C 11.2 (H) 11/05/2023    Review of Glycemic Control  Latest Reference Range & Units 11/05/23 11:24 11/05/23 12:27 11/05/23 13:22  Glucose-Capillary 70 - 99 mg/dL 852 (H) 844 (H) 766 (H)   Diabetes history: DM 1 Outpatient Diabetes medications:  Medtronic insulin  pump: 2.65 units-2.85 units/ hr- U500 insulin , 25 unit bolus with meals 700 units/24 hours per patient Tresiba 65 units q AM Dexcom G7  Metformin  1000 mg bid Ozempic 0.5 mg weekly Current orders for Inpatient glycemic control:  IV insulin -  Inpatient Diabetes Program Recommendations:    Spoke with patient.  He states that he was wearing insulin  pump yesterday and gave bolus however blood sugar rose.  He was not wearing CGM stating that they often come off in 4-5 days.  I told patient that I would encourage him to wear CGM so that he knows whether blood is high or low.   Patient appears to require high amounts of insulin  however his A1C is still not well controlled.  We briefly discussed newer pump options with algorithms.  He states he does not want to be dependent on computer for his diabetes.  Will follow-up regarding DM management while in the hospital.   Addendum 2:45 P- spoke with patient at bedside.  Wife has brought all supplies for insulin  pump.  Spoke with patient regarding current A1C of 11.1% and the importance of glycemic control.  He admits that he has not been checking his blood sugars and not wearing CGM.  We discussed some of the newer technology and newer pumps that may be more useful.  Discussed importance of checking blood sugars and knowing what blood sugar is prior  to administering insulin .  Wife states that she was concerned about this too. Patient to restart insulin  pump this afternoon.  Will follow.  Thanks,  Randall Bullocks, RN, BC-ADM Inpatient Diabetes Coordinator Pager [phone]  (8a-5p)

## 2023-11-05 NOTE — Progress Notes (Signed)
 NAME:  Juan [name], MRN:  [id number], DOB:  April 18, 1965, LOS: 1 ADMISSION DATE:  11/04/2023, CONSULTATION DATE:  11/04/23 REFERRING MD:  EDP, CHIEF COMPLAINT:  symptomatic bradycardia   History of Present Illness:  58 yo male with h/o t1dm with hyperglycemia on insulin  infusion U500 at baseline, presented with lethargy, weakness, pre syncope. Pt states that he was attempting to get by on an almost empty cartridge of his u500 insulin  pump when today began feeling weak and grayed out vision he thought perhaps he was hypoglycemic and took in apple juice to increase further his bs. When he arrived to the hospital his BS was >700 K >6 and he was in a 2nd degree av block with hypotension (80 sbp). Once epi was initiated pt immediately endorsed improvement in his symptoms. BP certainly improved but HR remains     20-40. Cardiology was consulted and recommended correcting elyte abnormalities initially to see if this resolves his presentation.   Ccm was asked to admit for metabolic derangements as well as utilization of epi infusion while we sort thru his presentation and determine his ultimate cause of dysrrthmia +/- need for tvp vs ppm.   Pertinent  Medical History  T1dm with hyperglycemia HFrEF (45% LVEF with also reduced RV function) 3v cabg Hyperlipidemia Recurrent pancreatitis Pad s/p LLE bypass  Significant Hospital Events: Including procedures, antibiotic start and stop dates in addition to other pertinent events   Admitted to ICU with bradycardia 8/18  Interim History / Subjective:  + 39 ml I/O Afebrile HR 30's-40's overnight  Objective    Blood pressure (!) 126/57, pulse (!) 41, temperature 98.1 F (36.7 C), temperature source Oral, resp. rate (!) 23, height 5' 11 (1.803 m), weight 102.4 kg, SpO2 95%.        Intake/Output Summary (Last 24 hours) at 11/05/2023 0856 Last data filed at 11/05/2023 0800 Gross per 24 hour  Intake 1729.09 ml  Output 1550 ml  Net 179.09 ml   Filed  Weights   11/04/23 1753 11/04/23 2100 11/05/23 0400  Weight: 102.1 kg 102.3 kg 102.4 kg    Examination: General: Adult male resting in bed HENT: [place]/AT Lungs:  clear with no increase work of breathing Cardiovascular: bradycardic, irregular  Abdomen: soft, nt, nd bs + Extremities: lower extremity edema trace- warm Neuro: Alert and oriented, no focal deficits, appropriate and oriented GU: deferred.   Resolved problem list   Assessment and Plan   Symptomatic bradycardia with presyncope Type II second degree AV block - Cardiology following, pending recommendations  CAD with previous CABG PAD s/p LLE bypass - Holding ASA/Plavix  until recs from cardiology reviewed. If no interventions planned can resume.   Hyperglycemia with t1dm - Remains on insulin  infusion with improvement in BG down to 241, continue insulin  infusion. Patient is out of insulin  on home pump and will need to bring in prior to transitioning off insulin  infusion.  Will continue insulin  infusion unless there are no plans for intervention and then can likely adjust back to home pump at that time.   Hyperkalemia - Improved with calcium , bicarb, lasix , IVF -  K down to 4 this morning  Acute kidney injury in setting of CKD - Creatinine baseline 1.5-2, creatinine at 3.1 on presentation and down trending.  HFmrEF/ICM - Hold GDMT in setting of recent epi use/AKI/bradycardia - Recent escalation of diuretics as outpatient, adjust IVF to [facility]   Best Practice (right click and Reselect all SmartList Selections daily)   Diet/type: NPO  DVT  prophylaxis prophylactic heparin   Pressure ulcer(s): present on admission  GI prophylaxis: N/A Lines: N/A Foley:  N/A Code Status:  full code Last date of multidisciplinary goals of care discussion [pt would like wife to make decisions for him should he not be able to]  Labs   CBC: Recent Labs  Lab 11/04/23 1759 11/04/23 1844 11/05/23 0248  WBC 12.9*  --  11.8*  NEUTROABS 8.0*   --   --   HGB 14.1 15.0 13.3  HCT 41.0 44.0 38.3*  MCV 79.0*  --  75.1*  PLT 190  --  130*    Basic Metabolic Panel: Recent Labs  Lab 11/01/23 0945 11/04/23 1759 11/04/23 1844 11/04/23 2207 11/05/23 0248  NA 127* 126* 124* 126* 129*  K 6.6* 6.6* 6.4* 4.6 4.0  CL 93* 94* 95* 92* 95*  CO2 19* 17*  --  20* 20*  GLUCOSE 658* 686* >700* 699* 436*  BUN 55* 55* 69* 54* 52*  CREATININE 2.05* 3.10* 3.10* 2.81* 2.54*  CALCIUM  8.8* 8.3*  --  8.5* 8.9  MG  --   --   --  2.4 2.2  PHOS  --   --   --   --  5.9*   GFR: Estimated Creatinine Clearance: 39.5 mL/min (A) (by C-G formula based on SCr of 2.54 mg/dL (H)). Recent Labs  Lab 11/04/23 1759 11/05/23 0248  WBC 12.9* 11.8*    Liver Function Tests: No results for input(s): AST, ALT, ALKPHOS, BILITOT, PROT, ALBUMIN  in the last 168 hours. No results for input(s): LIPASE, AMYLASE in the last 168 hours. No results for input(s): AMMONIA in the last 168 hours.  ABG    Component Value Date/Time   PHART 7.330 (L) 07/28/2021 1539   PCO2ART 48.2 (H) 07/28/2021 1539   PO2ART 62 (L) 07/28/2021 1539   HCO3 25.4 07/28/2021 1539   TCO2 23 11/04/2023 1844   ACIDBASEDEF 1.0 07/28/2021 1539   O2SAT 89 07/28/2021 1539     Coagulation Profile: No results for input(s): INR, PROTIME in the last 168 hours.  Cardiac Enzymes: No results for input(s): CKTOTAL, CKMB, CKMBINDEX, TROPONINI in the last 168 hours.  HbA1C: Hgb A1c MFr Bld  Date/Time Value Ref Range Status  11/05/2023 02:48 AM 11.2 (H) 4.8 - 5.6 % Final    Comment:    (NOTE) Diagnosis of Diabetes The following HbA1c ranges recommended by the American Diabetes Association (ADA) may be used as an aid in the diagnosis of diabetes mellitus.  Hemoglobin             Suggested A1C NGSP%              Diagnosis  <5.7                   Non Diabetic  5.7-6.4                Pre-Diabetic  >6.4                   Diabetic  <7.0                   Glycemic  control for                       adults with diabetes.    11/14/2021 08:47 AM 7.2 (H) 4.8 - 5.6 % Final    Comment:    (NOTE) Pre diabetes:          5.7%-6.4%  Diabetes:              >6.4%  Glycemic control for   <7.0% adults with diabetes     CBG: Recent Labs  Lab 11/05/23 0259 11/05/23 0359 11/05/23 0501 11/05/23 0605 11/05/23 0651  GLUCAP 375* 327* 296* 258* 241*    Review of Systems:   As per HPI  Past Medical History:  He,  has a past medical history of Abnormal cardiovascular stress test (02/06/2013), Anemia, Bilateral renal cysts (06/16/2011), Blood transfusion without reported diagnosis, CAD (coronary artery disease), LAD 90%, 1st diag 95%, LCX 70% wth AV groove 90%, RCA 40-50% mid and 80% long distal stenosis 02/03/13 (02/04/2013), Chest pain, CHF (congestive heart failure) (HCC), Coronary artery calcification seen on CAT scan, Diabetes mellitus, Fatty liver disease, nonalcoholic (06/16/2011), Hyperlipidemia, Hypertension, Lateral meniscus tear (09/2011), Loose body in knee (09/2011), Non Hodgkin's lymphoma (HCC) (1991), Pancreatitis, Pneumonia, S/P CABG x 4, 02/05/13 LIMA-LAD; LT. RADIAL-OM;VG-DIAG; VG-PDA (02/06/2013), Sleep apnea, Splenomegaly, congestive, chronic, and Stuffy and runny nose (10/02/2011).   Surgical History:   Past Surgical History:  Procedure Laterality Date   ABDOMINAL AORTIC ANEURYSM REPAIR     ABDOMINAL AORTOGRAM W/LOWER EXTREMITY N/A 07/18/2021   Procedure: ABDOMINAL AORTOGRAM W/LOWER EXTREMITY;  Surgeon: Serene Gaile ORN, MD;  Location: [facility] INVASIVE [facility] LAB;  Service: Cardiovascular;  Laterality: N/A;   ANTERIOR CERVICAL DECOMP/DISCECTOMY FUSION N/A 03/26/2018   Procedure: ANTERIOR CERVICAL DECOMPRESSION FUSION CERVICAL 5-6 WITH INSTRUMENTATION AND ALLOGRAFT;  Surgeon: Beuford Anes, MD;  Location: [facility] OR;  Service: Orthopedics;  Laterality: N/A;   CARDIAC CATHETERIZATION     CORONARY ARTERY BYPASS GRAFT N/A 02/05/2013   Procedure: CORONARY  ARTERY BYPASS GRAFTING (CABG);  Surgeon: Maude Fleeta Ochoa, MD;  Location: [facility] OR;  Service: Open Heart Surgery;  Laterality: N/A;  Coronary artery bypass graft on pump times four using left internal mammary artery and right greater saphenous vein via endovein harvest and left radial artery harvest.    CORONARY STENT INTERVENTION  12/17/2016    PCI and drug-eluting stenting of the mid and distal RCA SVG    CORONARY STENT INTERVENTION N/A 12/17/2016   Procedure: CORONARY STENT INTERVENTION;  Surgeon: Court Dorn PARAS, MD;  Location: [facility];  Service: Cardiovascular;  Laterality: N/A;   ELBOW SURGERY     FEMORAL-POPLITEAL BYPASS GRAFT Left 07/28/2021   Procedure: LEFT FEMORAL-BELOW KNEE POPLITEAL ARTERY BYPASS GRAFT;  Surgeon: Serene Gaile ORN, MD;  Location: [facility] OR;  Service: Vascular;  Laterality: Left;   HERNIA REPAIR     inguinal    herniated disc     INTRAOPERATIVE TRANSESOPHAGEAL ECHOCARDIOGRAM N/A 02/05/2013   Procedure: INTRAOPERATIVE TRANSESOPHAGEAL ECHOCARDIOGRAM;  Surgeon: Maude Fleeta Ochoa, MD;  Location[facility] OR;  Service: Open Heart Surgery;  Laterality: N/A;   KNEE ARTHROSCOPY  10/09/2011   Procedure: ARTHROSCOPY KNEE;  Surgeon: Eva Elsie Herring, MD;  Location: [facility];  Service: Orthopedics;  Laterality: Left;   LEFT HEART CATH AND CORS/GRAFTS ANGIOGRAPHY N/A 12/17/2016   Procedure: LEFT HEART CATH AND CORS/GRAFTS ANGIOGRAPHY;  Surgeon: Court Dorn PARAS, MD;  Location: [facility];  Service: Cardiovascular;  Laterality: N/A;   LEFT HEART CATHETERIZATION WITH CORONARY ANGIOGRAM N/A 02/03/2013   Procedure: LEFT HEART CATHETERIZATION WITH CORONARY ANGIOGRAM;  Surgeon: Dorn PARAS Court, MD;  Location: [facility] CATH LAB;  Service: Cardiovascular;  Laterality: N/A;   NASAL SEPTUM SURGERY     PARATHYROIDECTOMY Right 11/23/2021   Procedure: RIGHT INFERIOR PARATHYROIDECTOMY;  Surgeon: Eletha Boas, MD;  Location:  [facility] ORS;  Service: General;  Laterality: Right;    RADIAL ARTERY HARVEST Left 02/05/2013   Procedure: RADIAL ARTERY HARVEST;  Surgeon: Maude Fleeta Ochoa, MD;  Location: [facility] OR;  Service: Vascular;  Laterality: Left;   RIGHT/LEFT HEART CATH AND CORONARY/GRAFT ANGIOGRAPHY N/A 02/15/2020   Procedure: RIGHT/LEFT HEART CATH AND CORONARY/GRAFT ANGIOGRAPHY;  Surgeon: Court Dorn PARAS, MD;  Location: [facility];  Service: Cardiovascular;  Laterality: N/A;   TEE WITHOUT CARDIOVERSION N/A 02/18/2020   Procedure: TRANSESOPHAGEAL ECHOCARDIOGRAM (TEE);  Surgeon: Cherrie Toribio SAUNDERS, MD;  Location: [facility] ENDOSCOPY;  Service: Cardiovascular;  Laterality: N/A;   TIBIA BONE BIOPSY  x 3   left   TONSILLECTOMY     VEIN HARVEST Left 07/28/2021   Procedure: HARVEST OF GREAT SAPHENOUS VEIN;  Surgeon: Serene Gaile ORN, MD;  Location: [facility] OR;  Service: Vascular;  Laterality: Left;     Social History:   reports that he has never smoked. He has never been exposed to tobacco smoke. He has never used smokeless tobacco. He reports that he does not drink alcohol  and does not use drugs.   Family History:  His family history is not on file. He was adopted.   Allergies Allergies  Allergen Reactions   Empagliflozin  Other (See Comments)    Caused skin infection   Sglt2 Inhibitors Other (See Comments)    Cellulitis with Jardiance  and Farxiga   Reglan [Metoclopramide] Other (See Comments)    Hyperactivity with higher doses (can tolerate lower doses)      Home Medications  Prior to Admission medications   Medication Sig Start Date End Date Taking? Authorizing Provider  aspirin  81 MG chewable tablet Chew 1 tablet (81 mg total) by mouth daily. 12/19/16  Yes Henry Manuelita NOVAK, NP  clopidogrel  (PLAVIX ) 75 MG tablet TAKE 1 TABLET BY MOUTH EVERY DAY 04/24/23  Yes Court Dorn PARAS, MD  fenofibrate  160 MG tablet Take 160 mg by mouth in the morning.   Yes [provider]  icosapent  Ethyl (VASCEPA ) 1 g capsule TAKE 2 CAPSULES BY MOUTH TWICE A DAY 09/24/23  Yes  Bensimhon, Toribio SAUNDERS, MD  Insulin  Degludec (TRESIBA [place]) Inject 65 Units into the skin in the morning.   Yes [provider]  insulin  regular human CONCENTRATED (HUMULIN  R) 500 UNIT/ML injection Inject into the skin continuous. Pump setting 125 delivers up to 630 units (max daily dosage via pump)   Yes [provider]  isosorbide  mononitrate (IMDUR ) 30 MG 24 hr tablet TAKE 1 TABLET BY MOUTH EVERY DAY 02/05/23  Yes Hilty, Vinie BROCKS, MD  metFORMIN  (GLUCOPHAGE ) 1000 MG tablet Take 1,000 mg by mouth 2 (two) times daily with a meal.   Yes [provider]  metoprolol  succinate (TOPROL -XL) 25 MG 24 hr tablet TAKE 3 TABLETS BY MOUTH TWICE A DAY WITH OR IMMEDIATELY FOLLOWING A MEAL Patient taking differently: Take 75 mg by mouth 2 (two) times daily. 04/04/23  Yes Court Dorn PARAS, MD  nitroGLYCERIN  (NITROSTAT ) 0.4 MG SL tablet Place 1 tablet (0.4 mg total) under the tongue every 5 (five) minutes as needed. 09/27/23  Yes Milford, Harlene HERO, FNP  Propylene Glycol (SYSTANE BALANCE) 0.6 % SOLN Place 1 drop into both eyes 4 (four) times daily as needed (dry eyes).   Yes [provider]  rosuvastatin  (CRESTOR ) 5 MG tablet TAKE 1 TABLET (5 MG TOTAL) BY MOUTH DAILY. 08/05/23 07/30/24 Yes Hilty, Vinie BROCKS, MD  sacubitril -valsartan  (ENTRESTO ) 49-51 MG Take 1 tablet by mouth 2 (two) times  daily. NEEDS FOLLOW UP APPOINTMENT FOR MORE REFILLS 12/20/22  Yes Bensimhon, Toribio SAUNDERS, MD  spironolactone  (ALDACTONE ) 25 MG tablet TAKE 1 TABLET BY MOUTH EVERY DAY 07/09/23  Yes Bensimhon, Toribio SAUNDERS, MD  torsemide  (DEMADEX ) 20 MG tablet Take 4 tablets (80 mg total) by mouth 2 (two) times daily. 10/18/23 01/16/24 Yes Milford, Harlene HERO, FNP  Vitamin D , Ergocalciferol , (DRISDOL) 1.25 MG (50000 UNIT) CAPS capsule Take 50,000 Units by mouth 3 (three) times a week.   Yes [provider]  Continuous Blood Gluc Receiver (DEXCOM G7 RECEIVER) DEVI by Does not apply route.    [provider]  Glucose  Blood (BAYER CONTOUR NEXT TEST VI) glucose testing    [provider]  Semaglutide (OZEMPIC, 0.25 OR 0.5 MG/DOSE, [place]) Inject 0.5 mg into the skin once a week.    [provider]     Critical care time: 

## 2023-11-05 NOTE — Progress Notes (Signed)
*  PRELIMINARY RESULTS* Echocardiogram 2D Echocardiogram has been performed.  Juan [name] Stallion 11/05/2023, 10:23 AM

## 2023-11-06 ENCOUNTER — Encounter (HOSPITAL_COMMUNITY): Payer: Self-pay | Admitting: Critical Care Medicine

## 2023-11-06 DIAGNOSIS — I5022 Chronic systolic (congestive) heart failure: Secondary | ICD-10-CM | POA: Diagnosis not present

## 2023-11-06 DIAGNOSIS — R001 Bradycardia, unspecified: Secondary | ICD-10-CM | POA: Diagnosis not present

## 2023-11-06 LAB — BASIC METABOLIC PANEL WITH GFR
Anion gap: 11 (ref 5–15)
Anion gap: 10 (ref 5–15)
BUN: 27 mg/dL — ABNORMAL HIGH (ref 6–20)
BUN: 23 mg/dL — ABNORMAL HIGH (ref 6–20)
CO2: 25 mmol/L (ref 22–32)
CO2: 24 mmol/L (ref 22–32)
Calcium: 8.8 mg/dL — ABNORMAL LOW (ref 8.9–10.3)
Calcium: 8.9 mg/dL (ref 8.9–10.3)
Chloride: 97 mmol/L — ABNORMAL LOW (ref 98–111)
Chloride: 97 mmol/L — ABNORMAL LOW (ref 98–111)
Creatinine, Ser: 1.4 mg/dL — ABNORMAL HIGH (ref 0.61–1.24)
Creatinine, Ser: 1.35 mg/dL — ABNORMAL HIGH (ref 0.61–1.24)
GFR, Estimated: 59 mL/min — ABNORMAL LOW (ref >60)
GFR, Estimated: >60 mL/min (ref >60)
Glucose, Bld: 220 mg/dL — ABNORMAL HIGH (ref 70–99)
Glucose, Bld: 212 mg/dL — ABNORMAL HIGH (ref 70–99)
Potassium: 3.6 mmol/L (ref 3.5–5.1)
Potassium: 4.7 mmol/L (ref 3.5–5.1)
Sodium: 133 mmol/L — ABNORMAL LOW (ref 135–145)
Sodium: 131 mmol/L — ABNORMAL LOW (ref 135–145)

## 2023-11-06 LAB — GLUCOSE, CAPILLARY
Glucose-Capillary: 129 mg/dL — ABNORMAL HIGH (ref 70–99)
Glucose-Capillary: 130 mg/dL — ABNORMAL HIGH (ref 70–99)
Glucose-Capillary: 135 mg/dL — ABNORMAL HIGH (ref 70–99)
Glucose-Capillary: 140 mg/dL — ABNORMAL HIGH (ref 70–99)
Glucose-Capillary: 141 mg/dL — ABNORMAL HIGH (ref 70–99)
Glucose-Capillary: 144 mg/dL — ABNORMAL HIGH (ref 70–99)
Glucose-Capillary: 180 mg/dL — ABNORMAL HIGH (ref 70–99)
Glucose-Capillary: 202 mg/dL — ABNORMAL HIGH (ref 70–99)
Glucose-Capillary: 213 mg/dL — ABNORMAL HIGH (ref 70–99)
Glucose-Capillary: 214 mg/dL — ABNORMAL HIGH (ref 70–99)
Glucose-Capillary: 221 mg/dL — ABNORMAL HIGH (ref 70–99)
Glucose-Capillary: 239 mg/dL — ABNORMAL HIGH (ref 70–99)

## 2023-11-06 LAB — CBC
HCT: 38.6 % — ABNORMAL LOW (ref 39.0–52.0)
Hemoglobin: 12.8 g/dL — ABNORMAL LOW (ref 13.0–17.0)
MCH: 25.5 pg — ABNORMAL LOW (ref 26.0–34.0)
MCHC: 33.2 g/dL (ref 30.0–36.0)
MCV: 76.9 fL — ABNORMAL LOW (ref 80.0–100.0)
Platelets: 93 K/uL — ABNORMAL LOW (ref 150–400)
RBC: 5.02 MIL/uL (ref 4.22–5.81)
RDW: 16.4 % — ABNORMAL HIGH (ref 11.5–15.5)
WBC: 7.2 K/uL (ref 4.0–10.5)
nRBC: 0 % (ref 0.0–0.2)

## 2023-11-06 MED ORDER — INSULIN PUMP
SUBCUTANEOUS | Status: DC
  Administered 2023-11-06: 6.8 via SUBCUTANEOUS
  Administered 2023-11-07: 6.1 via SUBCUTANEOUS
  Administered 2023-11-07: 8 via SUBCUTANEOUS
  Filled 2023-11-06: qty 1

## 2023-11-06 MED ORDER — POTASSIUM CHLORIDE CRYS ER 20 MEQ PO TBCR
40.0000 meq | EXTENDED_RELEASE_TABLET | Freq: Once | ORAL | Status: AC
  Administered 2023-11-06: 40 meq via ORAL
  Filled 2023-11-06: qty 2

## 2023-11-06 NOTE — Progress Notes (Signed)
 PROGRESS NOTE    Juan [name]  FMW:[id number] DOB: Dec 28, 1965 DOA: 11/04/2023 PCP: Elliot Charm, MD   Brief Narrative:  58 year old male with chronic systolic heart failure, CAD status post CABG in 2014 followed by stenting to SVG-RCA in 2018, diabetes mellitus type 1 on insulin  pump, severe hypertriglyceridemia with subsequent episodes of pancreatitis, PAD status post left lower extremity bypass graft presented with lethargy, weakness and presyncope.  On presentation, blood sugars were more than 700 with potassium of >6 along with second AV block and hypotension.  He was started on IV epinephrine : blood pressure improved but patient remained bradycardic.  Cardiology was consulted.  Patient was started on insulin  drip and patient was admitted to ICU under PCCM service.  Subsequently, epinephrine  infusion was discontinued.  Blood sugars improved.  He was transferred to TRH service from 11/06/2023 onwards.  Assessment & Plan:   Symptomatic bradycardia/second-degree AV block -In the setting of metabolic derangement with hyperkalemia along with beta-blocker therapy - Resolved with correction of hyperglycemia.  No further AV block or pauses. Cardiology following: No indication for permanent pacemaker -Heart rate currently controlled.  Beta-blocker to remain on hold for now.  DKA in a patient with poorly controlled diabetes mellitus type 1 - Currently on insulin  drip.  Blood sugars are improving.  Diabetes coordinator following.  Will resume insulin  pump.  Advance diet to carb modified diet  Chronic systolic heart failure - Echo showed EF of 45 to 50%.  Currently euvolemic.  Strict input and output.  Daily weights.  Fluid restriction.  Heart failure team following.  GDMT on hold for now  CAD status post CABG followed by stenting Elevated troponins: Possibly from demand ischemia Hyperlipidemia - Currently chest pain-free.  Continue aspirin , statin and Plavix   AKI on CKD stage IIIa -  Baseline creatinine of 1.5-1.8.  Creatinine peaked to 3.1.  Improving to 1.4 this morning.  Monitor.  Hyperkalemia - Resolved  Hyponatremia - Mild.  Monitor.  Leukocytosis - Resolved  Thrombocytopenia -questionable cause.  Monitor.  No signs of bleeding.  Obesity class I - Outpatient follow-up   DVT prophylaxis: Heparin  subcutaneous Code Status: Full Family Communication: Wife at bedside Disposition Plan: Status is: Inpatient Remains inpatient appropriate because: Of severity of illness  Consultants: PCCM/cardiology  Procedures: 2D echo  Antimicrobials: None   Subjective: Patient seen and examined at bedside.  Denies any current chest pain, nausea, vomiting.  Feels slightly better.  Hoping to advance diet.  Objective: Vitals:   11/06/23 0700 11/06/23 0800 11/06/23 0846 11/06/23 0900  BP: 136/82 131/77  128/77  Pulse: 75 74  71  Resp: 11 18  20   Temp:   98.2 F (36.8 C)   TempSrc:   Oral   SpO2: 97% 97%  96%  Weight:      Height:        Intake/Output Summary (Last 24 hours) at 11/06/2023 0959 Last data filed at 11/06/2023 0900 Gross per 24 hour  Intake 483.52 ml  Output 1650 ml  Net -1166.48 ml   Filed Weights   11/04/23 2100 11/05/23 0400 11/06/23 0600  Weight: 102.3 kg 102.4 kg 102.4 kg    Examination:  General exam: Appears calm and comfortable. Respiratory system: Bilateral decreased breath sounds at bases Cardiovascular system: S1 & S2 heard, Rate controlled Gastrointestinal system: Abdomen is nondistended, soft and nontender. Normal bowel sounds heard. Extremities: No cyanosis, clubbing, edema  Central nervous system: Alert and oriented. No focal neurological deficits. Moving extremities Skin: No rashes, lesions or ulcers  Psychiatry: Flat affect.  Not agitated    Data Reviewed: I have personally reviewed following labs and imaging studies  CBC: Recent Labs  Lab 11/04/23 1759 11/04/23 1844 11/05/23 0248 11/06/23 0649  WBC 12.9*  --   11.8* 7.2  NEUTROABS 8.0*  --   --   --   HGB 14.1 15.0 13.3 12.8*  HCT 41.0 44.0 38.3* 38.6*  MCV 79.0*  --  75.1* 76.9*  PLT 190  --  130* 93*   Basic Metabolic Panel: Recent Labs  Lab 11/04/23 2207 11/05/23 0248 11/05/23 0838 11/05/23 2007 11/06/23 0806  NA 126* 129* 131* 133* 133*  K 4.6 4.0 3.5 3.7 3.6  CL 92* 95* 96* 97* 97*  CO2 20* 20* 22 23 25   GLUCOSE 699* 436* 212* 121* 220*  BUN 54* 52* 48* 37* 27*  CREATININE 2.81* 2.54* 2.11* 1.55* 1.40*  CALCIUM  8.5* 8.9 8.7* 8.6* 8.8*  MG 2.4 2.2  --   --   --   PHOS  --  5.9*  --   --   --    GFR: Estimated Creatinine Clearance: 71.8 mL/min (A) (by C-G formula based on SCr of 1.4 mg/dL (H)). Liver Function Tests: No results for input(s): AST, ALT, ALKPHOS, BILITOT, PROT, ALBUMIN  in the last 168 hours. No results for input(s): LIPASE, AMYLASE in the last 168 hours. No results for input(s): AMMONIA in the last 168 hours. Coagulation Profile: No results for input(s): INR, PROTIME in the last 168 hours. Cardiac Enzymes: No results for input(s): CKTOTAL, CKMB, CKMBINDEX, TROPONINI in the last 168 hours. BNP (last 3 results) No results for input(s): PROBNP in the last 8760 hours. HbA1C: Recent Labs    11/05/23 0248  HGBA1C 11.2*   CBG: Recent Labs  Lab 11/06/23 0302 11/06/23 0546 11/06/23 0740 11/06/23 0841 11/06/23 0944  GLUCAP 130* 129* 214* 202* 180*   Lipid Profile: No results for input(s): CHOL, HDL, LDLCALC, TRIG, CHOLHDL, LDLDIRECT in the last 72 hours. Thyroid  Function Tests: No results for input(s): TSH, T4TOTAL, FREET4, T3FREE, THYROIDAB in the last 72 hours. Anemia Panel: No results for input(s): VITAMINB12, FOLATE, FERRITIN, TIBC, IRON, RETICCTPCT in the last 72 hours. Sepsis Labs: No results for input(s): PROCALCITON, LATICACIDVEN in the last 168 hours.  Recent Results (from the past 240 hours)  MRSA Next Gen by PCR, Nasal      Status: None   Collection Time: 11/04/23  8:23 PM   Specimen: Nasal Mucosa; Nasal Swab  Result Value Ref Range Status   MRSA by PCR Next Gen NOT DETECTED NOT DETECTED Final    Comment: (NOTE) The GeneXpert MRSA Assay (FDA approved for NASAL specimens only), is one component of a comprehensive MRSA colonization surveillance program. It is not intended to diagnose MRSA infection nor to guide or monitor treatment for MRSA infections. Test performance is not FDA approved in patients less than 3 years old. Performed at [facility], [address], [place], [place] [address]          Radiology Studies: ECHOCARDIOGRAM COMPLETE Result Date: 11/05/2023    ECHOCARDIOGRAM REPORT   Patient Name:   Juan [name] Date of Exam: 11/05/2023 Medical Rec #:  [id number]     Height:       71.0 in Accession #:    [id number]    Weight:       225.7 lb Date of Birth:  09-09-1965     BSA:          2.220  m Patient Age:    57 years      BP:           121/50 mmHg Patient Gender: M             HR:           36 bpm. Exam Location:  Inpatient Procedure: 2D Echo, Cardiac Doppler and Color Doppler (Both Spectral and Color            Flow Doppler were utilized during procedure). Indications:    Abnormal ECG  History:        Patient has prior history of Echocardiogram examinations, most                 recent 03/08/2023. Arrythmias:Bradycardia.  Sonographer:    Benard Stallion Referring Phys: [phone] STEPHANIE M REESE IMPRESSIONS  1. Left ventricular ejection fraction, by estimation, is 45 to 50%. The left ventricle has mildly decreased function. The left ventricle demonstrates regional wall motion abnormalities (see scoring diagram/findings for description). There is mild concentric left ventricular hypertrophy. Diastolic function is indeterminate due to AV block. Elevated left atrial pressure. There is moderate hypokinesis of the left ventricular, basal-mid inferior wall, inferolateral wall and inferoseptal wall.  2.  Right ventricular systolic function is moderately reduced. The right ventricular size is normal. There is normal pulmonary artery systolic pressure. The estimated right ventricular systolic pressure is 33.2 mmHg.  3. Left atrial size was mild to moderately dilated.  4. The mitral valve is normal in structure. Trivial mitral valve regurgitation. No evidence of mitral stenosis.  5. The aortic valve is tricuspid. There is mild calcification of the aortic valve. Aortic valve regurgitation is not visualized.  6. The inferior vena cava is normal in size with <50% respiratory variability, suggesting right atrial pressure of 8 mmHg. Comparison(s): No significant change from prior study. Prior images reviewed side by side. During the study the rhythm is mostly sinus rhythm with 2:1 AV block, occasional with what appears to be Mobitz type 1, 3:2 AV conduction. FINDINGS  Left Ventricle: Left ventricular ejection fraction, by estimation, is 45 to 50%. The left ventricle has mildly decreased function. The left ventricle demonstrates regional wall motion abnormalities. Moderate hypokinesis of the left ventricular, basal-mid inferior wall, inferolateral wall and inferoseptal wall. The left ventricular internal cavity size was normal in size. There is mild concentric left ventricular hypertrophy. Diastolic function is indeterminate due to AV block. Elevated left atrial pressure. Right Ventricle: The right ventricular size is normal. No increase in right ventricular wall thickness. Right ventricular systolic function is moderately reduced. There is normal pulmonary artery systolic pressure. The tricuspid regurgitant velocity is 2.51 m/s, and with an assumed right atrial pressure of 8 mmHg, the estimated right ventricular systolic pressure is 33.2 mmHg. Left Atrium: Left atrial size was mild to moderately dilated. Right Atrium: Right atrial size was normal in size. Pericardium: There is no evidence of pericardial effusion. Mitral  Valve: The mitral valve is normal in structure. Mild mitral annular calcification. Trivial mitral valve regurgitation. No evidence of mitral valve stenosis. Tricuspid Valve: The tricuspid valve is normal in structure. Tricuspid valve regurgitation is trivial. Aortic Valve: The aortic valve is tricuspid. There is mild calcification of the aortic valve. Aortic valve regurgitation is not visualized. Aortic valve mean gradient measures 5.5 mmHg. Aortic valve peak gradient measures 11.0 mmHg. Aortic valve area, by  VTI measures 2.41 cm. Pulmonic Valve: The pulmonic valve was normal in structure. Pulmonic valve regurgitation is not  visualized. No evidence of pulmonic stenosis. Aorta: The aortic root and ascending aorta are structurally normal, with no evidence of dilitation. Venous: The inferior vena cava is normal in size with less than 50% respiratory variability, suggesting right atrial pressure of 8 mmHg. IAS/Shunts: No atrial level shunt detected by color flow Doppler.  LEFT VENTRICLE PLAX 2D LVIDd:         4.80 cm      Diastology LVIDs:         3.50 cm      LV e' medial:    3.81 cm/s LV PW:         1.20 cm      LV E/e' medial:  33.3 LV IVS:        1.10 cm      LV e' lateral:   6.74 cm/s LVOT diam:     2.20 cm      LV E/e' lateral: 18.8 LV SV:         76 LV SV Index:   34 LVOT Area:     3.80 cm  LV Volumes (MOD) LV vol d, MOD A2C: 151.0 ml LV vol d, MOD A4C: 113.0 ml LV vol s, MOD A2C: 71.3 ml LV vol s, MOD A4C: 55.0 ml LV SV MOD A2C:     79.7 ml LV SV MOD A4C:     113.0 ml LV SV MOD BP:      63.9 ml RIGHT VENTRICLE RV Basal diam:  4.10 cm RV Mid diam:    3.70 cm RV S prime:     5.98 cm/s TAPSE (M-mode): 0.6 cm LEFT ATRIUM             Index        RIGHT ATRIUM           Index LA diam:        4.20 cm 1.89 cm/m   RA Area:     13.30 cm LA Vol (A2C):   67.3 ml 30.32 ml/m  RA Volume:   29.00 ml  13.06 ml/m LA Vol (A4C):   46.2 ml 20.81 ml/m LA Biplane Vol: 55.9 ml 25.18 ml/m  AORTIC VALVE AV Area (Vmax):    2.55  cm AV Area (Vmean):   2.42 cm AV Area (VTI):     2.41 cm AV Vmax:           165.50 cm/s AV Vmean:          105.500 cm/s AV VTI:            0.315 m AV Peak Grad:      11.0 mmHg AV Mean Grad:      5.5 mmHg LVOT Vmax:         111.00 cm/s LVOT Vmean:        67.100 cm/s LVOT VTI:          0.200 m LVOT/AV VTI ratio: 0.63  AORTA Ao Root diam: 3.70 cm Ao Asc diam:  3.30 cm MITRAL VALVE                TRICUSPID VALVE MV Area (PHT): 4.89 cm     TR Peak grad:   25.2 mmHg MV Decel Time: 155 msec     TR Vmax:        251.00 cm/s MV E velocity: 127.00 cm/s MV A velocity: 88.60 cm/s   SHUNTS MV E/A ratio:  1.43         Systemic VTI:  0.20 m  Systemic Diam: 2.20 cm Jerel Balding MD Electronically signed by Jerel Balding MD Signature Date/Time: 11/05/2023/1:57:29 PM    Final    DG Chest 1 View Result Date: 11/04/2023 CLINICAL DATA:  Shortness of breath EXAM: CHEST  1 VIEW COMPARISON:  05/25/2022 FINDINGS: Hardware in the cervical spine. Post sternotomy changes. Mild low lung volume. No focal opacity, pleural effusion, or pneumothorax. Cardiomediastinal silhouette within normal limits. Coronary stent. IMPRESSION: No active disease. Low lung volume. Electronically Signed   By: Luke Bun M.D.   On: 11/04/2023 18:24        Scheduled Meds:  aspirin   81 mg Oral Daily   Chlorhexidine  Gluconate Cloth  6 each Topical Daily   clopidogrel   75 mg Oral Daily   heparin   5,000 Units Subcutaneous Q8H   insulin  pump   Subcutaneous Q4H   rosuvastatin   5 mg Oral Daily   Continuous Infusions:  insulin  10 Units/hr (11/06/23 0900)          Sophie Mao, MD [facility] 11/06/2023, 9:59 AM

## 2023-11-06 NOTE — Plan of Care (Signed)
?  Problem: Clinical Measurements: ?Goal: Respiratory complications will improve ?Outcome: Progressing ?  ?Problem: Clinical Measurements: ?Goal: Cardiovascular complication will be avoided ?Outcome: Progressing ?  ?Problem: Clinical Measurements: ?Goal: Diagnostic test results will improve ?Outcome: Progressing ?  ?

## 2023-11-06 NOTE — Progress Notes (Addendum)
 Advanced Heart Failure Rounding Note  Cardiologist: Dorn Lesches, MD  Chief Complaint: Transient Symptomatic Bradycardia    Patient Profile   58 y/o male w/ chronic systolic HF, EF 45%, 3VCAD s/p CABG in 2014 followed by stenting to SVG-RCA in 2018, DM2 on insulin  pump, severe hypertriglyceridemia w/ subsequent episodes of pancreatitis and PAD s.p LLE bypass graft admitted to CCU for symptomatic bradycardia/2nd degree AVB in the setting of DKA and metabolic derangement.   Subjective:    Remains on insulin  gtt. Glucose much improved. Hyperkalemia corrected, K 3.6  Conduction improved. Now NSR, HR 70s. No further bradycardia/HB  Echo yesterday, EF 45-50% (stable), RV moderately reduced (stable)    Feels well. Sitting up in bed. No chest pain or dyspnea. Volume status good.    Objective:   Weight Range: 102.4 kg Body mass index is 31.49 kg/m.   Vital Signs:   Temp:  [98 F (36.7 C)-98.6 F (37 C)] 98.2 F (36.8 C) (08/20 0846) Pulse Rate:  [39-80] 71 (08/20 0900) Resp:  [11-23] 20 (08/20 0900) BP: (105-151)/(58-91) 128/77 (08/20 0900) SpO2:  [89 %-99 %] 96 % (08/20 0900) Weight:  [102.4 kg] 102.4 kg (08/20 0600) Last BM Date : 11/05/23  Weight change: Filed Weights   11/04/23 2100 11/05/23 0400 11/06/23 0600  Weight: 102.3 kg 102.4 kg 102.4 kg    Intake/Output:   Intake/Output Summary (Last 24 hours) at 11/06/2023 1032 Last data filed at 11/06/2023 0900 Gross per 24 hour  Intake 402.29 ml  Output 1650 ml  Net -1247.71 ml      Physical Exam    General:  Well appearing. No resp difficulty HEENT: Normal Neck: Supple. JVP not elevated . Carotids 2+ bilat; no bruits. No lymphadenopathy or thyromegaly appreciated. Cor: PMI nondisplaced. Regular rate & rhythm. No rubs, gallops or murmurs. Lungs: Clear Abdomen: Soft, nontender, nondistended. No hepatosplenomegaly. No bruits or masses. Good bowel sounds. Extremities: No cyanosis, clubbing, rash, edema Neuro:  Alert & orientedx3, cranial nerves grossly intact. moves all 4 extremities w/o difficulty. Affect pleasant   Telemetry   NSR 70s, no further bradycardia or HB, personally reviewed   EKG    N/A   Labs    CBC Recent Labs    11/04/23 1759 11/04/23 1844 11/05/23 0248 11/06/23 0649  WBC 12.9*  --  11.8* 7.2  NEUTROABS 8.0*  --   --   --   HGB 14.1   < > 13.3 12.8*  HCT 41.0   < > 38.3* 38.6*  MCV 79.0*  --  75.1* 76.9*  PLT 190  --  130* 93*   < > = values in this interval not displayed.   Basic Metabolic Panel Recent Labs    91/81/74 2207 11/05/23 0248 11/05/23 0838 11/05/23 2007 11/06/23 0806  NA 126* 129*   < > 133* 133*  K 4.6 4.0   < > 3.7 3.6  CL 92* 95*   < > 97* 97*  CO2 20* 20*   < > 23 25  GLUCOSE 699* 436*   < > 121* 220*  BUN 54* 52*   < > 37* 27*  CREATININE 2.81* 2.54*   < > 1.55* 1.40*  CALCIUM  8.5* 8.9   < > 8.6* 8.8*  MG 2.4 2.2  --   --   --   PHOS  --  5.9*  --   --   --    < > = values in this interval not displayed.   Liver  Function Tests No results for input(s): AST, ALT, ALKPHOS, BILITOT, PROT, ALBUMIN  in the last 72 hours. No results for input(s): LIPASE, AMYLASE in the last 72 hours. Cardiac Enzymes No results for input(s): CKTOTAL, CKMB, CKMBINDEX, TROPONINI in the last 72 hours.  BNP: BNP (last 3 results) Recent Labs    09/27/23 0955 10/18/23 1017 11/04/23 1800  BNP 310.4* 211.2* 234.5*    ProBNP (last 3 results) No results for input(s): PROBNP in the last 8760 hours.   D-Dimer No results for input(s): DDIMER in the last 72 hours. Hemoglobin A1C Recent Labs    11/05/23 0248  HGBA1C 11.2*   Fasting Lipid Panel No results for input(s): CHOL, HDL, LDLCALC, TRIG, CHOLHDL, LDLDIRECT in the last 72 hours. Thyroid  Function Tests No results for input(s): TSH, T4TOTAL, T3FREE, THYROIDAB in the last 72 hours.  Invalid input(s): FREET3  Other results:   Imaging    No  results found.   Medications:     Scheduled Medications:  aspirin   81 mg Oral Daily   Chlorhexidine  Gluconate Cloth  6 each Topical Daily   clopidogrel   75 mg Oral Daily   heparin   5,000 Units Subcutaneous Q8H   insulin  pump   Subcutaneous Q4H   potassium chloride   40 mEq Oral Once   rosuvastatin   5 mg Oral Daily    Infusions:  insulin  10 Units/hr (11/06/23 0900)    PRN Medications: dextrose , docusate sodium , mouth rinse, polyethylene glycol    Assessment/Plan   1. DKA/ Poorly Controlled T2DM - admit Gluc 700, Hgb A1c 11.2  - on insulin  gtt per CCM, improving Gluc 200s - completed LR infusion now stopped  - IM to manage scheduled insulin  regimen    2. Bradycardia/ 2nd Deg AVB - in setting of metabolic derangement, Initial K 6.4. Also on ? blocker therapy PTA (Toprol  XL 75 mg daily, last dose was AM of admit) - resolved w/ correction of hyperkalemia, K now 3.6. Now NSR, HR 70s. No further AVB/pauses - no indication for PPM   - will plan to keep off ? blocker for now    3. Hyperkalemia - 2/2 DKA, corrected w/ insulin . 6.4>>3.6   4. Chronic Systolic Heart Failure  - Echo 2017 showed normal LVEF 60-65% - Echo 4/21, EF mildly reduced 45%. NST showed scar but no ischemia - Echo 11/21, EF 45%, RV systolic function low normal  - RHC 11/21 showed mildly elevated filling pressures, PCWP 19. LVEDP 23. CO by Fick 3.25, CI 1.48, by thermo calculation CO 7, CI 3.2.  - Echo 4/22 50-55%  - Echo 3/23, 05/22/21 EF 50%  - Echo 12/24: EF 45-50%, RV mod reduced  - Echo this admit, EF 45-50%, RV mod reduced  - Euvolemic on exam. NYHA II - holding HF GDMT for now. Can try to add back spiro and entresto  as he continues to improve and prior to d/c - no SGLT2i given poor glycemic control/ DKA   - plan to keep off Metoprolol  when discharged    5. CAD - s/p CABG in 2014, followed by PCI to SVG-RCA in 2018 - LHC 12/21 showed progression of native vessel CAD and grafts. All vein grafts are  functionally occluded with minimal L->R collaterals.  His LIMA is patent although his LAD is diffusely diseased (diabetic). No targets for intervention and not a candidate for redo CABG. - denies CP. Hs trop 40>>90>>261>>298. Suspect demand ischemia. Trend not c/w ACS  - continue ASA + Plavix  and statin    [facility]  from cardiac standpoint to transfer out of ICU.   Heart failure team will sign off as of 11/06/23. Will reassign to general cardiology rounds starting 8/21  HF Team Medication Recommendations for Home: Keep off Toprol  XL   Follow up as an outpatient in the HF clinic ?  Yes. We will arrange hospital f/u w/ APP and will place appt info in AVS    Length of Stay: 2  Juan [name]  11/06/2023, 10:32 AM  Advanced Heart Failure Team Pager [phone] (M-F; 7a - 5p)  Please contact [facility] for night-coverage after hours (5p -7a ) and weekends on [email or web address]   Patient seen and examined with the above-signed Advanced Practice Provider and/or Housestaff. I personally reviewed laboratory data, imaging studies and relevant notes. I independently examined the patient and formulated the important aspects of the plan. I have edited the note to reflect any of my changes or salient points. I have personally discussed the plan with the patient and/or family.  DKA resolved. HR back up in 70s. Feels good  General:  Sitting up in bed  No resp difficulty HEENT: normal Neck: supple. no JVD. Carotids 2+ bilat; no bruits. No lymphadenopathy or thryomegaly appreciated. Cor: PMI nondisplaced. Regular rate & rhythm. No rubs, gallops or murmurs. Lungs: clear Abdomen: soft, nontender, nondistended. No hepatosplenomegaly. No bruits or masses. Good bowel sounds. Extremities: no cyanosis, clubbing, rash, healed scars and chronic edema on LLE Neuro: alert & orientedx3, cranial nerves grossly intact. moves all 4 extremities w/o difficulty. Affect pleasant  Hyperkalemia and bradycardia has  resolved. Volume status ok. Would resume home meds with exception of metoprolol  (would stop)  Can likely go home soon   D/w TRH at bedside. We will sign off. Please call with questions.   Toribio Fuel, MD  11:24 AM

## 2023-11-06 NOTE — Progress Notes (Signed)
 Patient administered 5 units via dexcomp insulin  pump himself

## 2023-11-06 NOTE — Plan of Care (Signed)
   Problem: Education: Goal: Ability to describe self-care measures that may prevent or decrease complications (Diabetes Survival Skills Education) will improve Outcome: Progressing   Problem: Coping: Goal: Ability to adjust to condition or change in health will improve Outcome: Progressing   Problem: Fluid Volume: Goal: Ability to maintain a balanced intake and output will improve Outcome: Progressing

## 2023-11-06 NOTE — Inpatient Diabetes Management (Signed)
 Inpatient Diabetes Program Recommendations  AACE/ADA: New Consensus Statement on Inpatient Glycemic Control (2015)  Target Ranges:  Prepandial:   less than 140 mg/dL      Peak postprandial:   less than 180 mg/dL (1-2 hours)      Critically ill patients:  140 - 180 mg/dL    Latest Reference Range & Units 11/06/23 00:50 11/06/23 01:57 11/06/23 03:02 11/06/23 05:46 11/06/23 07:40 11/06/23 08:41 11/06/23 09:44  Glucose-Capillary 70 - 99 mg/dL 864 (H)  IV Insulin  Drip Running 144 (H) 130 (H) 129 (H) 214 (H) 202 (H) 180 (H)  (H): Data is abnormally high     Admit with:  Symptomatic bradycardia with presyncope Type II second degree AV block Hyperglycemia with T1dm  History: Type 1 diabetes  Home DM Meds:  Insulin  Pump with U500 Insulin  Medtronic insulin  pump: 2.65 units-2.85 units/ hr- U500 insulin , 25 unit bolus with meals 700 units/24 hours per patient Tresiba 65 units q AM Dexcom G7  Metformin  1000 mg bid Ozempic 0.5 mg weekly  Current Orders: IV Insulin  Drip    Planning transition to Home Insulin  Pump this AM Per pt, he has all insulin  pump supplies and Insulin     Met w/ pt at bedside around 12pm.  Pt A&O and able to independently manage his home insulin  pump.  Pt told me his Medtronic insulin  pump he normally uses (he thinks it was the 670g) cracked and he began to have issues with delivering boluses--Stated to me that the pump kept giving occlusionwarnings even though he changed the tubing several times.  He found a crack in the pump and believed this is the problem.  Pt took his old Medtronic insulin  pump (minmed) and placed the settings into this pump.  Pt told me he set the basal settings to the same a in the broken pump but is unsure if the Carb Ratio and Correction factor are the same that were in the broken pump.  I reviewed the current settings in the pump that pt is currently using and they can be viewed below.  Pt started Dexcom G7 CGM on his L arm yest 08/19.  I  reviewed with pt that we will still check his CBGs as ordered with the hospital meter and that pt will need to alert the RN to any and all boluses delivered to himself with the pump--Pt agreeable.  Pt did tell me he often boluses for food 30 min prior to eating.  I asked pt to please try to wait closer to when food is delivered as trays can be delayed and we do not want pt to have HYPO events.  Sees Dr. Balan about every 3-4 mos for follow up.  I asked pt is he has talked with Dr. Tommas about upgrading his pump since this pump is older and he said they are working on it and he wants to wait until he reaches his deductible.  Reviewed with RN via secure chat importance of CBG checks with hospital meter and charting boluses in the [facility].   Pump settings are as follows: --Insulin  Pump Settings--  Basal Rates: 0000-0400- 2.65 units/hr 0400-1900- 2.85 units/hr 1900- 2330- 2.95 units/hr 2330-0000- 2.65 units/hr  Total Basal Insulin  per 24 hours period= 67.95 units  Carbohydrate Ratio:  0000: 1 unit for every 4.8 Grams of Carbohydrates 0600: 1 unit for every 4 Grams of Carbohydrates 1830: 1 unit for every 5 Grams of Carbohydrates  Correction/Sensitivity Factor:  0000: 1 unit for every 13 mg/dl above Target  CBG 1000: 1 unit for every 10 mg/dl above Target CBG 8199: 1 unit for every 10 mg/dl above Target CBG  Target CBG: 130 mg/dl    --Will follow patient during hospitalization--  Adina Rudolpho Arrow RN, MSN, CDCES Diabetes Coordinator Inpatient Glycemic Control Team Team Pager: [phone] (8a-5p)

## 2023-11-07 ENCOUNTER — Other Ambulatory Visit (HOSPITAL_COMMUNITY): Payer: Self-pay

## 2023-11-07 ENCOUNTER — Other Ambulatory Visit (HOSPITAL_COMMUNITY)

## 2023-11-07 DIAGNOSIS — I5043 Acute on chronic combined systolic (congestive) and diastolic (congestive) heart failure: Secondary | ICD-10-CM | POA: Diagnosis not present

## 2023-11-07 DIAGNOSIS — R001 Bradycardia, unspecified: Secondary | ICD-10-CM | POA: Diagnosis not present

## 2023-11-07 LAB — BASIC METABOLIC PANEL WITH GFR
Anion gap: 16 — ABNORMAL HIGH (ref 5–15)
BUN: 22 mg/dL — ABNORMAL HIGH (ref 6–20)
CO2: 21 mmol/L — ABNORMAL LOW (ref 22–32)
Calcium: 9.3 mg/dL (ref 8.9–10.3)
Chloride: 99 mmol/L (ref 98–111)
Creatinine, Ser: 1.36 mg/dL — ABNORMAL HIGH (ref 0.61–1.24)
GFR, Estimated: >60 mL/min (ref >60)
Glucose, Bld: 257 mg/dL — ABNORMAL HIGH (ref 70–99)
Potassium: 4.5 mmol/L (ref 3.5–5.1)
Sodium: 136 mmol/L (ref 135–145)

## 2023-11-07 LAB — GLUCOSE, CAPILLARY
Glucose-Capillary: 248 mg/dL — ABNORMAL HIGH (ref 70–99)
Glucose-Capillary: 253 mg/dL — ABNORMAL HIGH (ref 70–99)
Glucose-Capillary: 151 mg/dL — ABNORMAL HIGH (ref 70–99)

## 2023-11-07 MED ORDER — SACUBITRIL-VALSARTAN 24-26 MG PO TABS
1.0000 | ORAL_TABLET | Freq: Two times a day (BID) | ORAL | 0 refills | Status: DC
  Filled 2023-11-07: qty 60, 30d supply, fill #0

## 2023-11-07 MED ORDER — SACUBITRIL-VALSARTAN 24-26 MG PO TABS
1.0000 | ORAL_TABLET | Freq: Two times a day (BID) | ORAL | Status: DC
  Administered 2023-11-07: 1 via ORAL
  Filled 2023-11-07: qty 1

## 2023-11-07 MED ORDER — SPIRONOLACTONE 25 MG PO TABS
25.0000 mg | ORAL_TABLET | Freq: Every day | ORAL | Status: DC
  Administered 2023-11-07: 25 mg via ORAL
  Filled 2023-11-07: qty 1

## 2023-11-07 NOTE — Discharge Summary (Signed)
 Physician Discharge Summary  [name]:[id number] DOB: 02-07-1966 DOA: 11/04/2023  PCP: Juan Charm, MD  Admit date: 11/04/2023 Discharge date: 11/07/2023  Admitted From: Home Disposition: Home  Recommendations for Outpatient Follow-up:  Follow up with PCP in 1 week with repeat CBC/BMP Outpatient follow-up with heart failure team Follow up in ED if symptoms worsen or new appear   Home Health: No Equipment/Devices: None  Discharge Condition: Stable CODE STATUS: Full Diet recommendation: Heart healthy/carb modified/fluid restriction of up to 1200 cc a day  Brief/Interim Summary: 58 year old male with chronic systolic heart failure, CAD status post CABG in 2014 followed by stenting to SVG-RCA in 2018, diabetes mellitus type 1 on insulin  pump, severe hypertriglyceridemia with subsequent episodes of pancreatitis, PAD status post left lower extremity bypass graft presented with lethargy, weakness and presyncope.  On presentation, blood sugars were more than 700 with potassium of >6 along with second AV block and hypotension.  He was started on IV epinephrine : blood pressure improved but patient remained bradycardic.  Cardiology was consulted.  Patient was started on insulin  drip and patient was admitted to ICU under PCCM service.  Subsequently, epinephrine  infusion was discontinued.  Blood sugars improved.  He was transferred to TRH service from 11/06/2023 onwards.  Subsequently, he has been transitioned to insulin  pump and is tolerating diet.  Cardiology team has cleared him for discharge home.  He will be discharged home today with outpatient follow-up with PCP and heart failure team.  Discharge Diagnoses:   Symptomatic bradycardia/second-degree AV block -In the setting of metabolic derangement with hyperkalemia along with beta-blocker therapy - Resolved with correction of hyperglycemia.  No further AV block or pauses. Cardiology following: No indication for permanent  pacemaker.  Cardiology has cleared him for discharge home today. -Heart rate currently controlled.  Beta-blocker has been discontinued -Discharge patient home today.   DKA in a patient with poorly controlled diabetes mellitus type 1 - Treated with insulin  drip and subsequently transitioned to insulin  pump.  Diabetes coordinator following.  Currently tolerating carb modified diet.  Resume home regimen on discharge except for metformin  till reevaluation by PCP.    Chronic systolic heart failure - Echo showed EF of 45 to 50%.  Currently euvolemic.  Diet and fluid restriction.  Heart failure team and cardiology team followed the patient during the hospitalization.  Entresto  has been resumed at a lower dose and spironolactone  also has been resumed from today by cardiology.  Imdur  and torsemide  to remain on hold till reevaluation by heart failure team as an outpatient.    CAD status post CABG followed by stenting Elevated troponins: Possibly from demand ischemia Hyperlipidemia - Currently chest pain-free.  Continue aspirin , statin and Plavix    AKI on CKD stage IIIa - Baseline creatinine of 1.5-1.8.  Creatinine peaked to 3.1.  Improving to 1.36 this morning.  Monitor as an outpatient.  Torsemide  to remain on hold.   Hyperkalemia - Resolved   Hyponatremia - Resolved.   Leukocytosis - Resolved   Thrombocytopenia -questionable cause.  Monitor intermittently as an outpatient.  No signs of bleeding.   Obesity class I - Outpatient follow-up  Discharge Instructions  Discharge Instructions     Diet - low sodium heart healthy   Complete by: As directed    Diet Carb Modified   Complete by: As directed    Increase activity slowly   Complete by: As directed       Allergies as of 11/07/2023       Reactions  Empagliflozin  Other (See Comments)   Caused skin infection   Sglt2 Inhibitors Other (See Comments)   Cellulitis with Jardiance  and Farxiga   Reglan [metoclopramide] Other (See  Comments)   Hyperactivity with higher doses (can tolerate lower doses)        Medication List     STOP taking these medications    Entresto  49-51 MG Generic drug: sacubitril -valsartan  Replaced by: sacubitril -valsartan  24-26 MG   isosorbide  mononitrate 30 MG 24 hr tablet Commonly known as: IMDUR    metFORMIN  1000 MG tablet Commonly known as: GLUCOPHAGE    metoprolol  succinate 25 MG 24 hr tablet Commonly known as: TOPROL -XL   torsemide  20 MG tablet Commonly known as: DEMADEX        TAKE these medications    aspirin  81 MG chewable tablet Chew 1 tablet (81 mg total) by mouth daily.   BAYER CONTOUR NEXT TEST VI glucose testing   clopidogrel  75 MG tablet Commonly known as: PLAVIX  TAKE 1 TABLET BY MOUTH EVERY DAY   Dexcom G7 Receiver Devi by Does not apply route.   fenofibrate  160 MG tablet Take 160 mg by mouth in the morning.   icosapent  Ethyl 1 g capsule Commonly known as: VASCEPA  TAKE 2 CAPSULES BY MOUTH TWICE A DAY   insulin  regular human CONCENTRATED 500 UNIT/ML injection Commonly known as: HUMULIN  R Inject into the skin continuous. Pump setting 125 delivers up to 630 units (max daily dosage via pump)   nitroGLYCERIN  0.4 MG SL tablet Commonly known as: Nitrostat  Place 1 tablet (0.4 mg total) under the tongue every 5 (five) minutes as needed.   OZEMPIC (0.25 OR 0.5 MG/DOSE) Lamar Inject 0.5 mg into the skin once a week.   rosuvastatin  5 MG tablet Commonly known as: CRESTOR  TAKE 1 TABLET (5 MG TOTAL) BY MOUTH DAILY.   sacubitril -valsartan  24-26 MG Commonly known as: ENTRESTO  Take 1 tablet by mouth 2 (two) times daily. Replaces: Entresto  49-51 MG   spironolactone  25 MG tablet Commonly known as: ALDACTONE  TAKE 1 TABLET BY MOUTH EVERY DAY   Systane Balance 0.6 % Soln Generic drug: Propylene Glycol Place 1 drop into both eyes 4 (four) times daily as needed (dry eyes).   TRESIBA  Inject 65 Units into the skin in the morning.   Vitamin D   (Ergocalciferol ) 1.25 MG (50000 UNIT) Caps capsule Commonly known as: DRISDOL Take 50,000 Units by mouth 3 (three) times a week.        Follow-up Information     [facility] Follow up.   Specialty: Cardiology Why: 11/27/23   10:30 AM Hospital Follow Up in the Advanced Heart Failure Clinic at [facility], [facility]) Contact information: [address] [place]  [address] [phone]        Juan Charm, MD. Schedule an appointment as soon as possible for a visit in 1 week(s).   Specialty: Internal Medicine Why: Please follow up in a week. Contact information: [address] [place] [address] [phone]                Allergies  Allergen Reactions   Empagliflozin  Other (See Comments)    Caused skin infection   Sglt2 Inhibitors Other (See Comments)    Cellulitis with Jardiance  and Farxiga   Reglan [Metoclopramide] Other (See Comments)    Hyperactivity with higher doses (can tolerate lower doses)     Consultations: PCCM/heart failure/cardiology   Procedures/Studies: ECHOCARDIOGRAM COMPLETE Result Date:  11/05/2023    ECHOCARDIOGRAM REPORT   Patient Name:   Zylen JOANNE BRANDER Date of Exam: 11/05/2023 Medical Rec #:  [id number]     Height:       71.0 in Accession #:    [id number]    Weight:       225.7 lb Date of Birth:  1965/08/28     BSA:          2.220 m Patient Age:    57 years      BP:           121/50 mmHg Patient Gender: M             HR:           36 bpm. Exam Location:  Inpatient Procedure: 2D Echo, Cardiac Doppler and Color Doppler (Both Spectral and Color            Flow Doppler were utilized during procedure). Indications:    Abnormal ECG  History:        Patient has prior history of Echocardiogram examinations, most                 recent 03/08/2023. Arrythmias:Bradycardia.  Sonographer:    Benard Stallion Referring Phys:  [phone] STEPHANIE M REESE IMPRESSIONS  1. Left ventricular ejection fraction, by estimation, is 45 to 50%. The left ventricle has mildly decreased function. The left ventricle demonstrates regional wall motion abnormalities (see scoring diagram/findings for description). There is mild concentric left ventricular hypertrophy. Diastolic function is indeterminate due to AV block. Elevated left atrial pressure. There is moderate hypokinesis of the left ventricular, basal-mid inferior wall, inferolateral wall and inferoseptal wall.  2. Right ventricular systolic function is moderately reduced. The right ventricular size is normal. There is normal pulmonary artery systolic pressure. The estimated right ventricular systolic pressure is 33.2 mmHg.  3. Left atrial size was mild to moderately dilated.  4. The mitral valve is normal in structure. Trivial mitral valve regurgitation. No evidence of mitral stenosis.  5. The aortic valve is tricuspid. There is mild calcification of the aortic valve. Aortic valve regurgitation is not visualized.  6. The inferior vena cava is normal in size with <50% respiratory variability, suggesting right atrial pressure of 8 mmHg. Comparison(s): No significant change from prior study. Prior images reviewed side by side. During the study the rhythm is mostly sinus rhythm with 2:1 AV block, occasional with what appears to be Mobitz type 1, 3:2 AV conduction. FINDINGS  Left Ventricle: Left ventricular ejection fraction, by estimation, is 45 to 50%. The left ventricle has mildly decreased function. The left ventricle demonstrates regional wall motion abnormalities. Moderate hypokinesis of the left ventricular, basal-mid inferior wall, inferolateral wall and inferoseptal wall. The left ventricular internal cavity size was normal in size. There is mild concentric left ventricular hypertrophy. Diastolic function is indeterminate due to AV block. Elevated left atrial pressure. Right Ventricle: The right  ventricular size is normal. No increase in right ventricular wall thickness. Right ventricular systolic function is moderately reduced. There is normal pulmonary artery systolic pressure. The tricuspid regurgitant velocity is 2.51 m/s, and with an assumed right atrial pressure of 8 mmHg, the estimated right ventricular systolic pressure is 33.2 mmHg. Left Atrium: Left atrial size was mild to moderately dilated. Right Atrium: Right atrial size was normal in size. Pericardium: There is no evidence of pericardial effusion. Mitral Valve: The mitral valve is normal in structure. Mild mitral annular calcification. Trivial mitral valve regurgitation. No evidence of  mitral valve stenosis. Tricuspid Valve: The tricuspid valve is normal in structure. Tricuspid valve regurgitation is trivial. Aortic Valve: The aortic valve is tricuspid. There is mild calcification of the aortic valve. Aortic valve regurgitation is not visualized. Aortic valve mean gradient measures 5.5 mmHg. Aortic valve peak gradient measures 11.0 mmHg. Aortic valve area, by  VTI measures 2.41 cm. Pulmonic Valve: The pulmonic valve was normal in structure. Pulmonic valve regurgitation is not visualized. No evidence of pulmonic stenosis. Aorta: The aortic root and ascending aorta are structurally normal, with no evidence of dilitation. Venous: The inferior vena cava is normal in size with less than 50% respiratory variability, suggesting right atrial pressure of 8 mmHg. IAS/Shunts: No atrial level shunt detected by color flow Doppler.  LEFT VENTRICLE PLAX 2D LVIDd:         4.80 cm      Diastology LVIDs:         3.50 cm      LV e' medial:    3.81 cm/s LV PW:         1.20 cm      LV E/e' medial:  33.3 LV IVS:        1.10 cm      LV e' lateral:   6.74 cm/s LVOT diam:     2.20 cm      LV E/e' lateral: 18.8 LV SV:         76 LV SV Index:   34 LVOT Area:     3.80 cm  LV Volumes (MOD) LV vol d, MOD A2C: 151.0 ml LV vol d, MOD A4C: 113.0 ml LV vol s, MOD A2C: 71.3  ml LV vol s, MOD A4C: 55.0 ml LV SV MOD A2C:     79.7 ml LV SV MOD A4C:     113.0 ml LV SV MOD BP:      63.9 ml RIGHT VENTRICLE RV Basal diam:  4.10 cm RV Mid diam:    3.70 cm RV S prime:     5.98 cm/s TAPSE (M-mode): 0.6 cm LEFT ATRIUM             Index        RIGHT ATRIUM           Index LA diam:        4.20 cm 1.89 cm/m   RA Area:     13.30 cm LA Vol (A2C):   67.3 ml 30.32 ml/m  RA Volume:   29.00 ml  13.06 ml/m LA Vol (A4C):   46.2 ml 20.81 ml/m LA Biplane Vol: 55.9 ml 25.18 ml/m  AORTIC VALVE AV Area (Vmax):    2.55 cm AV Area (Vmean):   2.42 cm AV Area (VTI):     2.41 cm AV Vmax:           165.50 cm/s AV Vmean:          105.500 cm/s AV VTI:            0.315 m AV Peak Grad:      11.0 mmHg AV Mean Grad:      5.5 mmHg LVOT Vmax:         111.00 cm/s LVOT Vmean:        67.100 cm/s LVOT VTI:          0.200 m LVOT/AV VTI ratio: 0.63  AORTA Ao Root diam: 3.70 cm Ao Asc diam:  3.30 cm MITRAL VALVE  TRICUSPID VALVE MV Area (PHT): 4.89 cm     TR Peak grad:   25.2 mmHg MV Decel Time: 155 msec     TR Vmax:        251.00 cm/s MV E velocity: 127.00 cm/s MV A velocity: 88.60 cm/s   SHUNTS MV E/A ratio:  1.43         Systemic VTI:  0.20 m                             Systemic Diam: 2.20 cm Jerel Croitoru MD Electronically signed by Jerel Balding MD Signature Date/Time: 11/05/2023/1:57:29 PM    Final    DG Chest 1 View Result Date: 11/04/2023 CLINICAL DATA:  Shortness of breath EXAM: CHEST  1 VIEW COMPARISON:  05/25/2022 FINDINGS: Hardware in the cervical spine. Post sternotomy changes. Mild low lung volume. No focal opacity, pleural effusion, or pneumothorax. Cardiomediastinal silhouette within normal limits. Coronary stent. IMPRESSION: No active disease. Low lung volume. Electronically Signed   By: Luke Bun M.D.   On: 11/04/2023 18:24   VAS US  ABI WITH/WO TBI Result Date: 10/26/2023  LOWER EXTREMITY DOPPLER STUDY Patient Name:  Juan [name]  Date of Exam:   10/25/2023 Medical Rec #: [id number]       Accession #:    [id number] Date of Birth: 02/11/1966      Patient Gender: M Patient Age:   28 years Exam Location:  [address] Procedure:      VAS US  ABI WITH/WO TBI Referring Phys: JESSICA MILFORD --------------------------------------------------------------------------------  Indications: Claudication. High Risk Factors: Hypertension, hyperlipidemia, Diabetes, no history of                    smoking.  Comparison Study: 01/14/23 right ABI: 1.11 and left ABI was 1.07 Performing Technologist: Dena Pane  Examination Guidelines: A complete evaluation includes at minimum, Doppler waveform signals and systolic blood pressure reading at the level of bilateral brachial, anterior tibial, and posterior tibial arteries, when vessel segments are accessible. Bilateral testing is considered an integral part of a complete examination. Photoelectric Plethysmograph (PPG) waveforms and toe systolic pressure readings are included as required and additional duplex testing as needed. Limited examinations for reoccurring indications may be performed as noted.  ABI Findings: +---------+------------------+-----+---------+--------+ Right    Rt Pressure (mmHg)IndexWaveform Comment  +---------+------------------+-----+---------+--------+ Brachial 113                    triphasic         +---------+------------------+-----+---------+--------+ PTA      118               1.02 biphasic          +---------+------------------+-----+---------+--------+ DP       133               1.15 biphasic          +---------+------------------+-----+---------+--------+ Great Toe58                0.50                   +---------+------------------+-----+---------+--------+ +---------+------------------+-----+-----------+-------+ Left     Lt Pressure (mmHg)IndexWaveform   Comment +---------+------------------+-----+-----------+-------+ Brachial 116                    triphasic           +---------+------------------+-----+-----------+-------+ PTA      133  1.15 multiphasic        +---------+------------------+-----+-----------+-------+ DP       119               1.03 multiphasic        +---------+------------------+-----+-----------+-------+ Great Toe71                0.61                    +---------+------------------+-----+-----------+-------+ +-------+-----------+-----------+------------+------------+ ABI/TBIToday's ABIToday's TBIPrevious ABIPrevious TBI +-------+-----------+-----------+------------+------------+ Right  1.15       0.50       1.11        0.86         +-------+-----------+-----------+------------+------------+ Left   1.15       0.61       1.07        0.88         +-------+-----------+-----------+------------+------------+ Bilateral ABIs appear essentially unchanged. Bilateral TBIs appear decreased.  Summary: Right: Resting right ankle-brachial index is within normal range. The right toe-brachial index is abnormal. Left: Resting left ankle-brachial index is within normal range. The left toe-brachial index is abnormal. *See table(s) above for measurements and observations.  Electronically signed by Dorn Ross MD on 10/26/2023 at 3:40:51 PM.    Final       Subjective: Patient seen and examined at bedside.  Tolerating diet.  Denies any chest pain, shortness of breath, abdominal pain.  Feels okay to go home today.  Discharge Exam: Vitals:   11/07/23 0428 11/07/23 0757  BP: 120/72 130/76  Pulse: 74 75  Resp: 18 18  Temp: 97.7 F (36.5 C) 97.8 F (36.6 C)  SpO2: 97% 98%    General: Pt is alert, awake, not in acute distress.  On room air. Cardiovascular: rate controlled, S1/S2 + Respiratory: bilateral decreased breath sounds at bases Abdominal: Soft, obese, NT, ND, bowel sounds + Extremities: Trace lower extremity edema; no cyanosis    The results of significant diagnostics from this hospitalization  (including imaging, microbiology, ancillary and laboratory) are listed below for reference.     Microbiology: Recent Results (from the past 240 hours)  MRSA Next Gen by PCR, Nasal     Status: None   Collection Time: 11/04/23  8:23 PM   Specimen: Nasal Mucosa; Nasal Swab  Result Value Ref Range Status   MRSA by PCR Next Gen NOT DETECTED NOT DETECTED Final    Comment: (NOTE) The GeneXpert MRSA Assay (FDA approved for NASAL specimens only), is one component of a comprehensive MRSA colonization surveillance program. It is not intended to diagnose MRSA infection nor to guide or monitor treatment for MRSA infections. Test performance is not FDA approved in patients less than 64 years old. Performed at [facility], [address] [place], [place] [address]      Labs: BNP (last 3 results) Recent Labs    09/27/23 0955 10/18/23 1017 11/04/23 1800  BNP 310.4* 211.2* 234.5*   Basic Metabolic Panel: Recent Labs  Lab 11/04/23 2207 11/05/23 0248 11/05/23 0838 11/05/23 2007 11/06/23 0806 11/06/23 1800 11/07/23 0733  NA 126* 129* 131* 133* 133* 131* 136  K 4.6 4.0 3.5 3.7 3.6 4.7 4.5  CL 92* 95* 96* 97* 97* 97* 99  CO2 20* 20* 22 23 25 24  21*  GLUCOSE 699* 436* 212* 121* 220* 212* 257*  BUN 54* 52* 48* 37* 27* 23* 22*  CREATININE 2.81* 2.54* 2.11* 1.55* 1.40* 1.35* 1.36*  CALCIUM  8.5* 8.9 8.7* 8.6* 8.8*  8.9 9.3  MG 2.4 2.2  --   --   --   --   --   PHOS  --  5.9*  --   --   --   --   --    Liver Function Tests: No results for input(s): AST, ALT, ALKPHOS, BILITOT, PROT, ALBUMIN  in the last 168 hours. No results for input(s): LIPASE, AMYLASE in the last 168 hours. No results for input(s): AMMONIA in the last 168 hours. CBC: Recent Labs  Lab 11/04/23 1759 11/04/23 1844 11/05/23 0248 11/06/23 0649  WBC 12.9*  --  11.8* 7.2  NEUTROABS 8.0*  --   --   --   HGB 14.1 15.0 13.3 12.8*  HCT 41.0 44.0 38.3* 38.6*  MCV 79.0*  --  75.1* 76.9*  PLT 190  --   130* 93*   Cardiac Enzymes: No results for input(s): CKTOTAL, CKMB, CKMBINDEX, TROPONINI in the last 168 hours. BNP: Invalid input(s): POCBNP CBG: Recent Labs  Lab 11/06/23 1226 11/06/23 1738 11/06/23 2057 11/07/23 0554 11/07/23 0755  GLUCAP 221* 213* 239* 248* 253*   D-Dimer No results for input(s): DDIMER in the last 72 hours. Hgb A1c Recent Labs    11/05/23 0248  HGBA1C 11.2*   Lipid Profile No results for input(s): CHOL, HDL, LDLCALC, TRIG, CHOLHDL, LDLDIRECT in the last 72 hours. Thyroid  function studies No results for input(s): TSH, T4TOTAL, T3FREE, THYROIDAB in the last 72 hours.  Invalid input(s): FREET3 Anemia work up No results for input(s): VITAMINB12, FOLATE, FERRITIN, TIBC, IRON, RETICCTPCT in the last 72 hours. Urinalysis    Component Value Date/Time   COLORURINE STRAW (A) 07/28/2021 0658   APPEARANCEUR CLEAR 07/28/2021 0658   APPEARANCEUR Clear 01/05/2021 1430   LABSPEC 1.010 07/28/2021 0658   PHURINE 5.0 07/28/2021 0658   GLUCOSEU NEGATIVE 07/28/2021 0658   HGBUR SMALL (A) 07/28/2021 0658   BILIRUBINUR NEGATIVE 07/28/2021 0658   BILIRUBINUR Negative 01/05/2021 1430   KETONESUR NEGATIVE 07/28/2021 0658   PROTEINUR 100 (A) 07/28/2021 0658   UROBILINOGEN 0.2 02/04/2013 1430   NITRITE NEGATIVE 07/28/2021 0658   LEUKOCYTESUR NEGATIVE 07/28/2021 0658   Sepsis Labs Recent Labs  Lab 11/04/23 1759 11/05/23 0248 11/06/23 0649  WBC 12.9* 11.8* 7.2   Microbiology Recent Results (from the past 240 hours)  MRSA Next Gen by PCR, Nasal     Status: None   Collection Time: 11/04/23  8:23 PM   Specimen: Nasal Mucosa; Nasal Swab  Result Value Ref Range Status   MRSA by PCR Next Gen NOT DETECTED NOT DETECTED Final    Comment: (NOTE) The GeneXpert MRSA Assay (FDA approved for NASAL specimens only), is one component of a comprehensive MRSA colonization surveillance program. It is not intended to diagnose MRSA  infection nor to guide or monitor treatment for MRSA infections. Test performance is not FDA approved in patients less than 55 years old. Performed at [facility], [address], [place], [place] [address]      Time coordinating discharge: 35 minutes  SIGNED:   Sophie Mao, MD  [facility] 11/07/2023, 9:53 AM

## 2023-11-07 NOTE — Progress Notes (Signed)
 Rounding Note   Patient Name: Juan [name] Date of Encounter: 11/07/2023  [facility] Cardiologist: Dorn Lesches, MD   Subjective Clinically improved, denies chest pain or shortness of breath  Scheduled Meds:  aspirin   81 mg Oral Daily   Chlorhexidine  Gluconate Cloth  6 each Topical Daily   clopidogrel   75 mg Oral Daily   heparin   5,000 Units Subcutaneous Q8H   insulin  pump   Subcutaneous Q4H   rosuvastatin   5 mg Oral Daily   Continuous Infusions:  insulin  Stopped (11/06/23 1316)   PRN Meds: dextrose , docusate sodium , mouth rinse, polyethylene glycol   Vital Signs  Vitals:   11/06/23 2351 11/07/23 0359 11/07/23 0428 11/07/23 0757  BP: (!) 114/53  120/72 130/76  Pulse: 83  74 75  Resp: 17  18 18   Temp: 98.1 F (36.7 C)  97.7 F (36.5 C) 97.8 F (36.6 C)  TempSrc: Oral  Oral Oral  SpO2: 98%  97% 98%  Weight:  101 kg    Height:        Intake/Output Summary (Last 24 hours) at 11/07/2023 0911 Last data filed at 11/07/2023 0824 Gross per 24 hour  Intake 29.43 ml  Output 700 ml  Net -670.57 ml      11/07/2023    3:59 AM 11/06/2023    2:26 PM 11/06/2023    6:00 AM  Last 3 Weights  Weight (lbs) 222 lb 10.6 oz 227 lb 1.2 oz 225 lb 12 oz  Weight (kg) 101 kg 103 kg 102.4 kg      Telemetry Sinus rhythm- Personally Reviewed  ECG  Not performed today- Personally Reviewed  Physical Exam  GEN: No acute distress.   Neck: No JVD Cardiac: RRR, no murmurs, rubs, or gallops.  Respiratory: Clear to auscultation bilaterally. GI: Soft, nontender, non-distended  MS: No edema; No deformity. Neuro:  Nonfocal  Psych: Normal affect   Labs High Sensitivity Troponin:   Recent Labs  Lab 11/04/23 1759 11/04/23 1953 11/04/23 2207 11/05/23 1514  TROPONINIHS 40* 90* 216* 298*     Chemistry Recent Labs  Lab 11/04/23 2207 11/05/23 0248 11/05/23 0838 11/05/23 2007 11/06/23 0806 11/06/23 1800  NA 126* 129*   < > 133* 133* 131*  K 4.6 4.0   < > 3.7 3.6  4.7  CL 92* 95*   < > 97* 97* 97*  CO2 20* 20*   < > 23 25 24   GLUCOSE 699* 436*   < > 121* 220* 212*  BUN 54* 52*   < > 37* 27* 23*  CREATININE 2.81* 2.54*   < > 1.55* 1.40* 1.35*  CALCIUM  8.5* 8.9   < > 8.6* 8.8* 8.9  MG 2.4 2.2  --   --   --   --   GFRNONAA 26* 29*   < > 52* 59* >60  ANIONGAP 14 14   < > 13 11 10    < > = values in this interval not displayed.    Lipids No results for input(s): CHOL, TRIG, HDL, LABVLDL, LDLCALC, CHOLHDL in the last 168 hours.  Hematology Recent Labs  Lab 11/04/23 1759 11/04/23 1844 11/05/23 0248 11/06/23 0649  WBC 12.9*  --  11.8* 7.2  RBC 5.19  --  5.10 5.02  HGB 14.1 15.0 13.3 12.8*  HCT 41.0 44.0 38.3* 38.6*  MCV 79.0*  --  75.1* 76.9*  MCH 27.2  --  26.1 25.5*  MCHC 34.4  --  34.7 33.2  RDW 16.4*  --  16.1* 16.4*  PLT 190  --  130* 93*   Thyroid  No results for input(s): TSH, FREET4 in the last 168 hours.  BNP Recent Labs  Lab 11/04/23 1800  BNP 234.5*    DDimer No results for input(s): DDIMER in the last 168 hours.   Radiology  ECHOCARDIOGRAM COMPLETE Result Date: 11/05/2023    ECHOCARDIOGRAM REPORT   Patient Name:   Juan [name] Date of Exam: 11/05/2023 Medical Rec #:  [id number]     Height:       71.0 in Accession #:    [id number]    Weight:       225.7 lb Date of Birth:  Nov 04, 1965     BSA:          2.220 m Patient Age:    58 years      BP:           121/50 mmHg Patient Gender: M             HR:           36 bpm. Exam Location:  Inpatient Procedure: 2D Echo, Cardiac Doppler and Color Doppler (Both Spectral and Color            Flow Doppler were utilized during procedure). Indications:    Abnormal ECG  History:        Patient has prior history of Echocardiogram examinations, most                 recent 03/08/2023. Arrythmias:Bradycardia.  Sonographer:    Benard Stallion Referring Phys: [phone] STEPHANIE M REESE IMPRESSIONS  1. Left ventricular ejection fraction, by estimation, is 45 to 50%. The left ventricle has  mildly decreased function. The left ventricle demonstrates regional wall motion abnormalities (see scoring diagram/findings for description). There is mild concentric left ventricular hypertrophy. Diastolic function is indeterminate due to AV block. Elevated left atrial pressure. There is moderate hypokinesis of the left ventricular, basal-mid inferior wall, inferolateral wall and inferoseptal wall.  2. Right ventricular systolic function is moderately reduced. The right ventricular size is normal. There is normal pulmonary artery systolic pressure. The estimated right ventricular systolic pressure is 33.2 mmHg.  3. Left atrial size was mild to moderately dilated.  4. The mitral valve is normal in structure. Trivial mitral valve regurgitation. No evidence of mitral stenosis.  5. The aortic valve is tricuspid. There is mild calcification of the aortic valve. Aortic valve regurgitation is not visualized.  6. The inferior vena cava is normal in size with <50% respiratory variability, suggesting right atrial pressure of 8 mmHg. Comparison(s): No significant change from prior study. Prior images reviewed side by side. During the study the rhythm is mostly sinus rhythm with 2:1 AV block, occasional with what appears to be Mobitz type 1, 3:2 AV conduction. FINDINGS  Left Ventricle: Left ventricular ejection fraction, by estimation, is 45 to 50%. The left ventricle has mildly decreased function. The left ventricle demonstrates regional wall motion abnormalities. Moderate hypokinesis of the left ventricular, basal-mid inferior wall, inferolateral wall and inferoseptal wall. The left ventricular internal cavity size was normal in size. There is mild concentric left ventricular hypertrophy. Diastolic function is indeterminate due to AV block. Elevated left atrial pressure. Right Ventricle: The right ventricular size is normal. No increase in right ventricular wall thickness. Right ventricular systolic function is moderately  reduced. There is normal pulmonary artery systolic pressure. The tricuspid regurgitant velocity is 2.51 m/s, and with an assumed right atrial pressure of 8  mmHg, the estimated right ventricular systolic pressure is 33.2 mmHg. Left Atrium: Left atrial size was mild to moderately dilated. Right Atrium: Right atrial size was normal in size. Pericardium: There is no evidence of pericardial effusion. Mitral Valve: The mitral valve is normal in structure. Mild mitral annular calcification. Trivial mitral valve regurgitation. No evidence of mitral valve stenosis. Tricuspid Valve: The tricuspid valve is normal in structure. Tricuspid valve regurgitation is trivial. Aortic Valve: The aortic valve is tricuspid. There is mild calcification of the aortic valve. Aortic valve regurgitation is not visualized. Aortic valve mean gradient measures 5.5 mmHg. Aortic valve peak gradient measures 11.0 mmHg. Aortic valve area, by  VTI measures 2.41 cm. Pulmonic Valve: The pulmonic valve was normal in structure. Pulmonic valve regurgitation is not visualized. No evidence of pulmonic stenosis. Aorta: The aortic root and ascending aorta are structurally normal, with no evidence of dilitation. Venous: The inferior vena cava is normal in size with less than 50% respiratory variability, suggesting right atrial pressure of 8 mmHg. IAS/Shunts: No atrial level shunt detected by color flow Doppler.  LEFT VENTRICLE PLAX 2D LVIDd:         4.80 cm      Diastology LVIDs:         3.50 cm      LV e' medial:    3.81 cm/s LV PW:         1.20 cm      LV E/e' medial:  33.3 LV IVS:        1.10 cm      LV e' lateral:   6.74 cm/s LVOT diam:     2.20 cm      LV E/e' lateral: 18.8 LV SV:         76 LV SV Index:   34 LVOT Area:     3.80 cm  LV Volumes (MOD) LV vol d, MOD A2C: 151.0 ml LV vol d, MOD A4C: 113.0 ml LV vol s, MOD A2C: 71.3 ml LV vol s, MOD A4C: 55.0 ml LV SV MOD A2C:     79.7 ml LV SV MOD A4C:     113.0 ml LV SV MOD BP:      63.9 ml RIGHT VENTRICLE  RV Basal diam:  4.10 cm RV Mid diam:    3.70 cm RV S prime:     5.98 cm/s TAPSE (M-mode): 0.6 cm LEFT ATRIUM             Index        RIGHT ATRIUM           Index LA diam:        4.20 cm 1.89 cm/m   RA Area:     13.30 cm LA Vol (A2C):   67.3 ml 30.32 ml/m  RA Volume:   29.00 ml  13.06 ml/m LA Vol (A4C):   46.2 ml 20.81 ml/m LA Biplane Vol: 55.9 ml 25.18 ml/m  AORTIC VALVE AV Area (Vmax):    2.55 cm AV Area (Vmean):   2.42 cm AV Area (VTI):     2.41 cm AV Vmax:           165.50 cm/s AV Vmean:          105.500 cm/s AV VTI:            0.315 m AV Peak Grad:      11.0 mmHg AV Mean Grad:      5.5 mmHg LVOT Vmax:  111.00 cm/s LVOT Vmean:        67.100 cm/s LVOT VTI:          0.200 m LVOT/AV VTI ratio: 0.63  AORTA Ao Root diam: 3.70 cm Ao Asc diam:  3.30 cm MITRAL VALVE                TRICUSPID VALVE MV Area (PHT): 4.89 cm     TR Peak grad:   25.2 mmHg MV Decel Time: 155 msec     TR Vmax:        251.00 cm/s MV E velocity: 127.00 cm/s MV A velocity: 88.60 cm/s   SHUNTS MV E/A ratio:  1.43         Systemic VTI:  0.20 m                             Systemic Diam: 2.20 cm Jerel Balding MD Electronically signed by Jerel Balding MD Signature Date/Time: 11/05/2023/1:57:29 PM    Final     Cardiac Studies   2D echocardiogram (11/05/2023) IMPRESSIONS     1. Left ventricular ejection fraction, by estimation, is 45 to 50%. The  left ventricle has mildly decreased function. The left ventricle  demonstrates regional wall motion abnormalities (see scoring  diagram/findings for description). There is mild  concentric left ventricular hypertrophy. Diastolic function is  indeterminate due to AV block. Elevated left atrial pressure. There is  moderate hypokinesis of the left ventricular, basal-mid inferior wall,  inferolateral wall and inferoseptal wall.   2. Right ventricular systolic function is moderately reduced. The right  ventricular size is normal. There is normal pulmonary artery systolic  pressure.  The estimated right ventricular systolic pressure is 33.2 mmHg.   3. Left atrial size was mild to moderately dilated.   4. The mitral valve is normal in structure. Trivial mitral valve  regurgitation. No evidence of mitral stenosis.   5. The aortic valve is tricuspid. There is mild calcification of the  aortic valve. Aortic valve regurgitation is not visualized.   6. The inferior vena cava is normal in size with <50% respiratory  variability, suggesting right atrial pressure of 8 mmHg.   Comparison(s): No significant change from prior study. Prior images  reviewed side by side. During the study the rhythm is mostly sinus rhythm  with 2:1 AV block, occasional with what appears to be Mobitz type 1, 3:2  AV conduction.    Patient Profile   58 y/o male w/ chronic systolic HF, EF 45%, 3VCAD s/p CABG in 2014 followed by stenting to SVG-RCA in 2018, DM2 on insulin  pump, severe hypertriglyceridemia w/ subsequent episodes of pancreatitis and PAD s.p LLE bypass graft admitted to CCU for symptomatic bradycardia/2nd degree AVB in the setting of DKA and metabolic derangement.   Assessment & Plan   1: DKA-resolved into H on insulin  drip and LR infusion.  Glucose is now down to the low 200 range.  2: Bradycardia/second-degree AV block-resolved since metabolic issues resolved.  Beta-blocker on hold  3: Chronic systolic heart failure-EF 45 to 50% which is stable.  On GDMT followed by Dr. Bensimhon in the advanced heart failure clinic as an outpatient.  GDMT meds held during this episode and will be restarted except for metoprolol .  4: CAD-status post CABG in 2014.  I performed right left heart cath 12/21 revealing functionally occluded vein grafts with a patent LIMA to the LAD.  He did have mildly elevated troponins probably  related to demand ischemia.  I agree that this probably does not represent ACS.  On DAPT and statin therapy.  Patient feels back to normal.  Will add back GDMT meds except for  beta-blocker.  Rhythm sinus without AV block.  Probably ready for discharge from our point of view.  Will arrange TOC 7 with the advanced heart failure clinic after which he  will see Dr. Cherrie back in follow-up.  We will sign off. [facility] will sign off.   The patient is ready for discharge today from a cardiac standpoint. Medication Recommendations: Restart GDMT except for beta-blocker.  Can decrease Entresto  to lowest dose. Other recommendations (labs, testing, etc): None Follow up as an outpatient: TOC 7 with advanced outpatient heart failure clinic followed by Dr. Cherrie. For questions or updates, please contact [facility] Please consult [email or web address] for contact info under     Signed, Dorn Lesches, MD  11/07/2023, 9:11 AM

## 2023-11-25 NOTE — Progress Notes (Signed)
 Advanced Heart Failure Clinic   PCP: Elliot Charm, MD Primary Cardiologist: Court HF: Dr. Cherrie  HPI: Juan [name] is 58 y.o. male w/ h/o systolic HF, EF 45%, 3VCAD s/p CABG in 2014 followed by stenting to SVG-RCA in 2018, DM2, severe hypertriglyceridemia w/ subsequent episodes of pancreatitis and PAD s.p LLE bypass graft.  Seen by Dr. Court 11/21 for progressive dyspnea and wt gain which was being managed by increasing home diuretics w/o much improvement in volume or symptoms. Echo repeated showing stable LVEF at 45%. RV systolic function low normal. Mild-mod MR also noted. Given persistent symptoms, he had LHC that showed progression of his native and graft disease.  All vein grafts are functionally occluded with minimal left to right collaterals.  His LIMA is patent although his LAD is diffusely diseased.  RHC showed mPCWP mildly elevated at 19. LVEDP 23. CO by Fick 3.25, CI 1.48, by thermo calculation CO 7, CI 3.2.  Echo 4/22 EF 50-55% mild MR   Echo 05/22/21 showed EF 50%, mild LVH, RV mildly reduced, aortic root dilation 37 mm  Echo 12/24: EF 45-50%, G2DD, mildly reduced RV  Admitted 8/25 with DKA with subsequent bradycardia and transient heart block. GDMT held, admitted to ICU and placed on insulin  gtt. Echo showed EF 45-50%, RV moderately reduced (stable). GDMT held, plan to keep off beta blocker. He was discharged home, weight 235 lbs.   Today he returns for post hospital HF follow up. Overall feeling fine. Has URI, so breathing not great with congestion. He does not think he has COVID. He is not SOB walking. Swelling up and down. Denies palpitations, abnormal bleeding, CP, dizziness, edema, or PND/Orthopnea. Appetite ok. Weight at home 232 pounds. Taking all medications, now off scheduled torsemide . Works full time for [employer]. Not using CPAP.   ROS: All systems negative except as listed in HPI, PMH and Problem List.  SH:  Social History   Socioeconomic History    Marital status: Married    Spouse name: Research scientist (physical sciences)   Number of children: 1   Years of education: 16   Highest education level: Not on file  Occupational History   Occupation: Biochemist, clinical     Employer: [employer]  Tobacco Use   Smoking status: Never    Passive exposure: Never   Smokeless tobacco: Never  Vaping Use   Vaping status: Never Used  Substance and Sexual Activity   Alcohol  use: No   Drug use: No   Sexual activity: Yes  Other Topics Concern   Not on file  Social History Narrative   Adopted, so no pertinent FH.  Married.  Lives with wife in [place].  Independent of ADLs and ambulation.   Social Drivers of Corporate investment banker Strain: Not on file  Food Insecurity: No Food Insecurity (11/06/2023)   Hunger Vital Sign    Worried About Running Out of Food in the Last Year: Never true    Ran Out of Food in the Last Year: Never true  Transportation Needs: No Transportation Needs (11/06/2023)   PRAPARE - Administrator, Civil Service (Medical): No    Lack of Transportation (Non-Medical): No  Physical Activity: Not on file  Stress: Not on file  Social Connections: Not on file  Intimate Partner Violence: Not At Risk (11/05/2023)   Humiliation, Afraid, Rape, and Kick questionnaire    Fear of Current or Ex-Partner: No    Emotionally Abused: No    Physically Abused: No  Sexually Abused: No   FH:  Family History  Adopted: Yes   Past Medical History:  Diagnosis Date   Abnormal cardiovascular stress test 02/06/2013   Anemia    occasional - no problems currently(10/02/2011)   Bilateral renal cysts 06/16/2011   states no known problems   Blood transfusion without reported diagnosis    CAD (coronary artery disease), LAD 90%, 1st diag 95%, LCX 70% wth AV groove 90%, RCA 40-50% mid and 80% long distal stenosis 02/03/13 02/04/2013   Chest pain    positive Myoview  stress test   CHF (congestive heart failure) (HCC)    Coronary artery  calcification seen on CAT scan    Diabetes mellitus    IDDM   Fatty liver disease, nonalcoholic 06/16/2011   Hyperlipidemia    Hypertension    states no dx. of HTN, takes med. to protect kidneys due to DM   Lateral meniscus tear 09/2011   left   Loose body in knee 09/2011   loose bodies left knee   Non Hodgkin's lymphoma (HCC) 1991   Pancreatitis    occasional - last episode 06/2011   Pneumonia    S/P CABG x 4, 02/05/13 LIMA-LAD; LT. RADIAL-OM;VG-DIAG; VG-PDA 02/06/2013   10/18 3/4 patent grafts (occluded SVG-->Diag), PCI/DESx 3 SVG-->RCA, normal EF   Sleep apnea    mild per patient   Splenomegaly, congestive, chronic    Stuffy and runny nose 10/02/2011   yellow drainage from nose   Current Outpatient Medications  Medication Sig Dispense Refill   aspirin  81 MG chewable tablet Chew 1 tablet (81 mg total) by mouth daily.     clopidogrel  (PLAVIX ) 75 MG tablet TAKE 1 TABLET BY MOUTH EVERY DAY 90 tablet 3   Continuous Blood Gluc Receiver (DEXCOM G7 RECEIVER) DEVI by Does not apply route.     fenofibrate  160 MG tablet Take 160 mg by mouth in the morning.     Glucose Blood (BAYER CONTOUR NEXT TEST VI) glucose testing     icosapent  Ethyl (VASCEPA ) 1 g capsule TAKE 2 CAPSULES BY MOUTH TWICE A DAY 360 capsule 4   Insulin  Degludec (TRESIBA [place]) Inject 65 Units into the skin in the morning.     insulin  regular human CONCENTRATED (HUMULIN  R) 500 UNIT/ML injection Inject into the skin continuous. Pump setting 125 delivers up to 630 units (max daily dosage via pump)     metFORMIN  (GLUCOPHAGE ) 1000 MG tablet Take 1,000 mg by mouth 2 (two) times daily with a meal.     nitroGLYCERIN  (NITROSTAT ) 0.4 MG SL tablet Place 1 tablet (0.4 mg total) under the tongue every 5 (five) minutes as needed. 25 tablet 2   Propylene Glycol (SYSTANE BALANCE) 0.6 % SOLN Place 1 drop into both eyes 4 (four) times daily as needed (dry eyes).     rosuvastatin  (CRESTOR ) 5 MG tablet TAKE 1 TABLET (5 MG TOTAL) BY MOUTH DAILY.  90 tablet 0   sacubitril -valsartan  (ENTRESTO ) 24-26 MG Take 1 tablet by mouth 2 (two) times daily. 60 tablet 0   spironolactone  (ALDACTONE ) 25 MG tablet TAKE 1 TABLET BY MOUTH EVERY DAY 90 tablet 2   Vitamin D , Ergocalciferol , (DRISDOL) 1.25 MG (50000 UNIT) CAPS capsule Take 50,000 Units by mouth 3 (three) times a week.     Semaglutide (OZEMPIC, 0.25 OR 0.5 MG/DOSE, [place]) Inject 0.5 mg into the skin once a week. (Patient not taking: Reported on 11/27/2023)     No current facility-administered medications for this encounter.   BP (!) 146/74  Pulse 82   Ht 5' 11 (1.803 m)   Wt 106.9 kg (235 lb 9.6 oz)   SpO2 97%   BMI 32.86 kg/m   Wt Readings from Last 3 Encounters:  11/27/23 106.9 kg (235 lb 9.6 oz)  11/07/23 101 kg (222 lb 10.6 oz)  10/31/23 102.2 kg (225 lb 6.4 oz)   PHYSICAL EXAM: General:  NAD. No resp difficulty, walked into clinic, chronically-ill appearing. HEENT: Normal Neck: Supple. No JVD. Cor: Regular rate & rhythm. No rubs, gallops or murmurs. Lungs: Clear Abdomen: Soft, nontender, nondistended.  Extremities: No cyanosis, clubbing, rash, 1-2+ BLE chronic lymphedema L>R, chronic venous stasis skin changes Neuro: Alert & oriented x 3, moves all 4 extremities w/o difficulty. Affect pleasant.  ECG (personally reviewed): SR 1AVB PR 216 msec  ASSESSMENT & PLAN: 1. Chronic Systolic Heart Failure due to iCM - Echo 2017 showed normal LVEF 60-65% - Echo 4/21, EF mildly reduced 45%. NST showed scar but no ischemia - Echo 11/21, EF 45%, RV systolic function low normal  - RHC 11/21 showed mildly elevated filling pressures, PCWP 19. LVEDP 23. CO by Fick 3.25, CI 1.48, by thermo calculation CO 7, CI 3.2.  - Echo 4/22 EF 50-55%  - Echo 3/23: EF 50%. - Echo 12/24: EF 45-50%. - Echo 8/25: EF 40-45%. - Stable NYHA II-IIb. Volume looks ok, weight down - Increase Entresto  to 49/51 mg bid - Continue torsemide  20 mg PRN - Continue spiro 25 mg daily. - Off beta blocker with HB -  Continue compression stockings. - Failed Jardiance /Farxiga due to cellulitis, also with uncontrolled diabetes - Labs today. Repeat BMET in 10-14 days.  2. Bradycardia/ 2nd Deg AVB - in setting of DKA - Resolved w/ correction of hyperkalemia, K now 3.6. Now NSR, HR 70s. No further AVB/pauses - no indication for PPM   - NSR on ECG today. - Keep off ? blocker for now   3. CAD - s/p CABG in 2014, followed by PCI to SVG-RCA in 2018 - LHC 12/21 showed progression of native vessel CAD and grafts. All vein grafts are functionally occluded with minimal L->R collaterals.  His LIMA is patent although his LAD is diffusely diseased (diabetic). No targets for intervention and not a candidate for redo CABG.  - No CP - Followed by Dr. Court. - Cont ASA + Plavix  - Unable to tolerate statins   4. Mitral Regurgitation  - Very mild MR noted on TEE 11/21 after diuresis.  - Trivial on echo 3/23 - Trivial on echo 12/24 - Trivial on echo 8/25  5. IDDM, poorly controlled - Has insulin  pump.  - Managed by Dr. Lavon - Recent admit with DKA, glucose 700. - No SGLT2i   6. Severe Hypertriglyceridemia  - Has been as high as 6,000 - Now follows with Dr. Mona.  - His testing for [facility] was genetically negative  7. PAD - s/p LLE bypass graft.  - Followed by [facility].  - Chronic lymphedema greatly affecting functional status and QOL - We have tried to arrange lymphedema pumps   8. CKD IIIb - Baseline SCr 1.8 - Follows with Dr. Macel - Labs today.  Follow up in 6-8 weeks with APP.  Harlene CHRISTELLA Gainer, FNP  10:55 AM

## 2023-11-26 ENCOUNTER — Telehealth (HOSPITAL_COMMUNITY): Payer: Self-pay

## 2023-11-26 NOTE — Telephone Encounter (Signed)
 Called to confirm/remind patient of their appointment at the [facility] Failure Clinic on 11/27/23.   Appointment:   [] Confirmed  [x] Left mess   [] No answer/No voice mail  [] VM Full/unable to leave message  [] Phone not in service  And to bring in all medications and/or complete list.

## 2023-11-27 ENCOUNTER — Encounter (HOSPITAL_COMMUNITY): Payer: Self-pay

## 2023-11-27 ENCOUNTER — Ambulatory Visit (HOSPITAL_COMMUNITY)
Admit: 2023-11-27 | Discharge: 2023-11-27 | Disposition: A | Discharge status: Home / Self Care | Attending: Family Medicine | Admitting: Family Medicine

## 2023-11-27 ENCOUNTER — Ambulatory Visit (HOSPITAL_COMMUNITY): Payer: Self-pay | Admitting: Family Medicine

## 2023-11-27 VITALS — BP 146/74 | HR 82 | Ht 71.0 in | Wt 235.6 lb

## 2023-11-27 DIAGNOSIS — I2510 Atherosclerotic heart disease of native coronary artery without angina pectoris: Secondary | ICD-10-CM | POA: Diagnosis not present

## 2023-11-27 DIAGNOSIS — I890 Lymphedema, not elsewhere classified: Secondary | ICD-10-CM | POA: Insufficient documentation

## 2023-11-27 DIAGNOSIS — Z9641 Presence of insulin pump (external) (internal): Secondary | ICD-10-CM | POA: Diagnosis not present

## 2023-11-27 DIAGNOSIS — I5020 Unspecified systolic (congestive) heart failure: Secondary | ICD-10-CM

## 2023-11-27 DIAGNOSIS — Z794 Long term (current) use of insulin: Secondary | ICD-10-CM | POA: Insufficient documentation

## 2023-11-27 DIAGNOSIS — E781 Pure hyperglyceridemia: Secondary | ICD-10-CM | POA: Insufficient documentation

## 2023-11-27 DIAGNOSIS — I452 Bifascicular block: Secondary | ICD-10-CM | POA: Insufficient documentation

## 2023-11-27 DIAGNOSIS — Z951 Presence of aortocoronary bypass graft: Secondary | ICD-10-CM | POA: Insufficient documentation

## 2023-11-27 DIAGNOSIS — E1151 Type 2 diabetes mellitus with diabetic peripheral angiopathy without gangrene: Secondary | ICD-10-CM | POA: Insufficient documentation

## 2023-11-27 DIAGNOSIS — N1832 Chronic kidney disease, stage 3b: Secondary | ICD-10-CM | POA: Diagnosis not present

## 2023-11-27 DIAGNOSIS — Z79899 Other long term (current) drug therapy: Secondary | ICD-10-CM | POA: Insufficient documentation

## 2023-11-27 DIAGNOSIS — Z955 Presence of coronary angioplasty implant and graft: Secondary | ICD-10-CM | POA: Insufficient documentation

## 2023-11-27 DIAGNOSIS — Z7902 Long term (current) use of antithrombotics/antiplatelets: Secondary | ICD-10-CM | POA: Insufficient documentation

## 2023-11-27 DIAGNOSIS — Z7982 Long term (current) use of aspirin: Secondary | ICD-10-CM | POA: Insufficient documentation

## 2023-11-27 DIAGNOSIS — I5022 Chronic systolic (congestive) heart failure: Secondary | ICD-10-CM | POA: Diagnosis present

## 2023-11-27 DIAGNOSIS — I255 Ischemic cardiomyopathy: Secondary | ICD-10-CM | POA: Insufficient documentation

## 2023-11-27 DIAGNOSIS — E1122 Type 2 diabetes mellitus with diabetic chronic kidney disease: Secondary | ICD-10-CM | POA: Diagnosis not present

## 2023-11-27 DIAGNOSIS — N183 Chronic kidney disease, stage 3 unspecified: Secondary | ICD-10-CM

## 2023-11-27 DIAGNOSIS — Z8679 Personal history of other diseases of the circulatory system: Secondary | ICD-10-CM | POA: Diagnosis not present

## 2023-11-27 DIAGNOSIS — I440 Atrioventricular block, first degree: Secondary | ICD-10-CM | POA: Diagnosis not present

## 2023-11-27 DIAGNOSIS — Z8719 Personal history of other diseases of the digestive system: Secondary | ICD-10-CM | POA: Diagnosis not present

## 2023-11-27 DIAGNOSIS — Z95820 Peripheral vascular angioplasty status with implants and grafts: Secondary | ICD-10-CM | POA: Diagnosis not present

## 2023-11-27 DIAGNOSIS — Z7984 Long term (current) use of oral hypoglycemic drugs: Secondary | ICD-10-CM | POA: Diagnosis not present

## 2023-11-27 DIAGNOSIS — I739 Peripheral vascular disease, unspecified: Secondary | ICD-10-CM

## 2023-11-27 DIAGNOSIS — I493 Ventricular premature depolarization: Secondary | ICD-10-CM | POA: Diagnosis not present

## 2023-11-27 DIAGNOSIS — I340 Nonrheumatic mitral (valve) insufficiency: Secondary | ICD-10-CM | POA: Diagnosis not present

## 2023-11-27 DIAGNOSIS — E1165 Type 2 diabetes mellitus with hyperglycemia: Secondary | ICD-10-CM | POA: Insufficient documentation

## 2023-11-27 LAB — BASIC METABOLIC PANEL WITH GFR
Anion gap: 9 (ref 5–15)
BUN: 24 mg/dL — ABNORMAL HIGH (ref 6–20)
CO2: 22 mmol/L (ref 22–32)
Calcium: 9.2 mg/dL (ref 8.9–10.3)
Chloride: 107 mmol/L (ref 98–111)
Creatinine, Ser: 1.23 mg/dL (ref 0.61–1.24)
GFR, Estimated: >60 mL/min (ref >60)
Glucose, Bld: 134 mg/dL — ABNORMAL HIGH (ref 70–99)
Potassium: 4 mmol/L (ref 3.5–5.1)
Sodium: 138 mmol/L (ref 135–145)

## 2023-11-27 LAB — BRAIN NATRIURETIC PEPTIDE: B Natriuretic Peptide: 435.5 pg/mL — ABNORMAL HIGH (ref 0.0–100.0)

## 2023-11-27 MED ORDER — SACUBITRIL-VALSARTAN 49-51 MG PO TABS: 1.0000 | ORAL_TABLET | Freq: Two times a day (BID) | ORAL | 3 refills | Status: AC

## 2023-11-27 MED ORDER — TORSEMIDE 20 MG PO TABS: 20.0000 mg | ORAL_TABLET | ORAL | 3 refills | Status: DC | PRN

## 2023-11-27 NOTE — Patient Instructions (Signed)
 Increase Entresto  to 49/51 mg twice daily - updated Rx sent. Take Torsemide  20 mg as needed - updated Rx sent. Labs today - will call you if abnormal. Repeat labs in 10 - 14 days. See below. Return to Heart Failure APP Clinic in 6 - 8 weeks. See below. Please call us  at [phone] if any questions or concerns prior to your next appointment.

## 2023-12-20 ENCOUNTER — Other Ambulatory Visit (HOSPITAL_COMMUNITY)

## 2024-01-23 ENCOUNTER — Telehealth (HOSPITAL_COMMUNITY): Payer: Self-pay

## 2024-01-23 NOTE — Progress Notes (Signed)
 [facility]   PCP: Elliot Charm, MD Primary Cardiologist: Court HF: Dr. Cherrie  HPI: Juan [name] is 58 y.o. male w/ h/o systolic HF, EF 45%, 3VCAD s/p CABG in 2014 followed by stenting to SVG-RCA in 2018, DM2, severe hypertriglyceridemia w/ subsequent episodes of pancreatitis and PAD s.p LLE bypass graft.  Seen by Dr. Court 11/21 for progressive dyspnea and wt gain which was being managed by increasing home diuretics w/o much improvement in volume or symptoms. Echo repeated showing stable LVEF at 45%. RV systolic function low normal. Mild-mod MR also noted. Given persistent symptoms, he had LHC that showed progression of his native and graft disease.  All vein grafts are functionally occluded with minimal left to right collaterals.  His LIMA is patent although his LAD is diffusely diseased.  RHC showed mPCWP mildly elevated at 19. LVEDP 23. CO by Fick 3.25, CI 1.48, by thermo calculation CO 7, CI 3.2.  Echo 4/22 EF 50-55% mild MR   Echo 05/22/21 showed EF 50%, mild LVH, RV mildly reduced, aortic root dilation 37 mm  Echo 12/24: EF 45-50%, G2DD, mildly reduced RV  Admitted 8/25 with DKA with subsequent bradycardia and transient heart block. GDMT held, admitted to ICU and placed on insulin  gtt. Echo showed EF 45-50%, RV moderately reduced (stable). GDMT held, plan to keep off beta blocker. He was discharged home, weight 235 lbs.   Today he returns for post hospital HF follow up. Overall feeling fine. Has URI, so breathing not great with congestion. He does not think he has COVID. He is not SOB walking. Swelling up and down. Denies palpitations, abnormal bleeding, CP, dizziness, edema, or PND/Orthopnea. Appetite ok. Weight at home 232 pounds. Taking all medications, now off scheduled torsemide . Works full time for [employer]. Not using CPAP.   ROS: All systems negative except as listed in HPI, PMH and Problem List.  SH:  Social History   Socioeconomic History    Marital status: Married    Spouse name: Research Scientist (physical Sciences)   Number of children: 1   Years of education: 16   Highest education level: Not on file  Occupational History   Occupation: biochemist, clinical     Employer: [employer]  Tobacco Use   Smoking status: Never    Passive exposure: Never   Smokeless tobacco: Never  Vaping Use   Vaping status: Never Used  Substance and Sexual Activity   Alcohol  use: No   Drug use: No   Sexual activity: Yes  Other Topics Concern   Not on file  Social History Narrative   Adopted, so no pertinent FH.  Married.  Lives with wife in [place].  Independent of ADLs and ambulation.   Social Drivers of Corporate Investment Banker Strain: Not on file  Food Insecurity: No Food Insecurity (11/06/2023)   Hunger Vital Sign    Worried About Running Out of Food in the Last Year: Never true    Ran Out of Food in the Last Year: Never true  Transportation Needs: No Transportation Needs (11/06/2023)   PRAPARE - Administrator, Civil Service (Medical): No    Lack of Transportation (Non-Medical): No  Physical Activity: Not on file  Stress: Not on file  Social Connections: Not on file  Intimate Partner Violence: Not At Risk (11/05/2023)   Humiliation, Afraid, Rape, and Kick questionnaire    Fear of Current or Ex-Partner: No    Emotionally Abused: No    Physically Abused: No  Sexually Abused: No   FH:  Family History  Adopted: Yes   Past Medical History:  Diagnosis Date   Abnormal cardiovascular stress test 02/06/2013   Anemia    occasional - no problems currently(10/02/2011)   Bilateral renal cysts 06/16/2011   states no known problems   Blood transfusion without reported diagnosis    CAD (coronary artery disease), LAD 90%, 1st diag 95%, LCX 70% wth AV groove 90%, RCA 40-50% mid and 80% long distal stenosis 02/03/13 02/04/2013   Chest pain    positive Myoview  stress test   CHF (congestive heart failure) (HCC)    Coronary artery  calcification seen on CAT scan    Diabetes mellitus    IDDM   Fatty liver disease, nonalcoholic 06/16/2011   Hyperlipidemia    Hypertension    states no dx. of HTN, takes med. to protect kidneys due to DM   Lateral meniscus tear 09/2011   left   Loose body in knee 09/2011   loose bodies left knee   Non Hodgkin's lymphoma (HCC) 1991   Pancreatitis    occasional - last episode 06/2011   Pneumonia    S/P CABG x 4, 02/05/13 LIMA-LAD; LT. RADIAL-OM;VG-DIAG; VG-PDA 02/06/2013   10/18 3/4 patent grafts (occluded SVG-->Diag), PCI/DESx 3 SVG-->RCA, normal EF   Sleep apnea    mild per patient   Splenomegaly, congestive, chronic    Stuffy and runny nose 10/02/2011   yellow drainage from nose   Current Outpatient Medications  Medication Sig Dispense Refill   aspirin  81 MG chewable tablet Chew 1 tablet (81 mg total) by mouth daily.     clopidogrel  (PLAVIX ) 75 MG tablet TAKE 1 TABLET BY MOUTH EVERY DAY 90 tablet 3   Continuous Blood Gluc Receiver (DEXCOM G7 RECEIVER) DEVI by Does not apply route.     fenofibrate  160 MG tablet Take 160 mg by mouth in the morning.     Glucose Blood (BAYER CONTOUR NEXT TEST VI) glucose testing     icosapent  Ethyl (VASCEPA ) 1 g capsule TAKE 2 CAPSULES BY MOUTH TWICE A DAY 360 capsule 4   Insulin  Degludec (TRESIBA [place]) Inject 65 Units into the skin in the morning.     insulin  regular human CONCENTRATED (HUMULIN  R) 500 UNIT/ML injection Inject into the skin continuous. Pump setting 125 delivers up to 630 units (max daily dosage via pump)     metFORMIN  (GLUCOPHAGE ) 1000 MG tablet Take 1,000 mg by mouth 2 (two) times daily with a meal.     nitroGLYCERIN  (NITROSTAT ) 0.4 MG SL tablet Place 1 tablet (0.4 mg total) under the tongue every 5 (five) minutes as needed. 25 tablet 2   Propylene Glycol (SYSTANE BALANCE) 0.6 % SOLN Place 1 drop into both eyes 4 (four) times daily as needed (dry eyes).     rosuvastatin  (CRESTOR ) 5 MG tablet TAKE 1 TABLET (5 MG TOTAL) BY MOUTH DAILY.  90 tablet 0   sacubitril -valsartan  (ENTRESTO ) 49-51 MG Take 1 tablet by mouth 2 (two) times daily. 180 tablet 3   Semaglutide (OZEMPIC, 0.25 OR 0.5 MG/DOSE, [place]) Inject 0.5 mg into the skin once a week. (Patient not taking: Reported on 11/27/2023)     spironolactone  (ALDACTONE ) 25 MG tablet TAKE 1 TABLET BY MOUTH EVERY DAY 90 tablet 2   torsemide  (DEMADEX ) 20 MG tablet Take 1 tablet (20 mg total) by mouth as needed. 30 tablet 3   Vitamin D , Ergocalciferol , (DRISDOL) 1.25 MG (50000 UNIT) CAPS capsule Take 50,000 Units by mouth 3 (  three) times a week.     No current facility-administered medications for this visit.   There were no vitals taken for this visit.  Wt Readings from Last 3 Encounters:  11/27/23 106.9 kg (235 lb 9.6 oz)  11/07/23 101 kg (222 lb 10.6 oz)  10/31/23 102.2 kg (225 lb 6.4 oz)   PHYSICAL EXAM: General:  NAD. No resp difficulty, walked into clinic, chronically-ill appearing. HEENT: Normal Neck: Supple. No JVD. Cor: Regular rate & rhythm. No rubs, gallops or murmurs. Lungs: Clear Abdomen: Soft, nontender, nondistended.  Extremities: No cyanosis, clubbing, rash, 1-2+ BLE chronic lymphedema L>R, chronic venous stasis skin changes Neuro: Alert & oriented x 3, moves all 4 extremities w/o difficulty. Affect pleasant.  ECG (personally reviewed): SR 1AVB PR 216 msec  ASSESSMENT & PLAN: 1. Chronic Systolic Heart Failure due to iCM - Echo 2017 showed normal LVEF 60-65% - Echo 4/21, EF mildly reduced 45%. NST showed scar but no ischemia - Echo 11/21, EF 45%, RV systolic function low normal  - RHC 11/21 showed mildly elevated filling pressures, PCWP 19. LVEDP 23. CO by Fick 3.25, CI 1.48, by thermo calculation CO 7, CI 3.2.  - Echo 4/22 EF 50-55%  - Echo 3/23: EF 50%. - Echo 12/24: EF 45-50%. - Echo 8/25: EF 40-45%. - Stable NYHA II-IIb. Volume looks ok, weight down - Increase Entresto  to 49/51 mg bid - Continue torsemide  20 mg PRN - Continue spiro 25 mg daily. - Off  beta blocker with HB - Continue compression stockings. - Failed Jardiance /Farxiga due to cellulitis, also with uncontrolled diabetes - Labs today. Repeat BMET in 10-14 days.  2. Bradycardia/ 2nd Deg AVB - in setting of DKA - Resolved w/ correction of hyperkalemia, K now 3.6. Now NSR, HR 70s. No further AVB/pauses - no indication for PPM   - NSR on ECG today. - Keep off ? blocker for now   3. CAD - s/p CABG in 2014, followed by PCI to SVG-RCA in 2018 - LHC 12/21 showed progression of native vessel CAD and grafts. All vein grafts are functionally occluded with minimal L->R collaterals.  His LIMA is patent although his LAD is diffusely diseased (diabetic). No targets for intervention and not a candidate for redo CABG.  - No CP - Followed by Dr. Court. - Cont ASA + Plavix  - Unable to tolerate statins   4. Mitral Regurgitation  - Very mild MR noted on TEE 11/21 after diuresis.  - Trivial on echo 3/23 - Trivial on echo 12/24 - Trivial on echo 8/25  5. IDDM, poorly controlled - Has insulin  pump.  - Managed by Dr. Lavon - Recent admit with DKA, glucose 700. - No SGLT2i   6. Severe Hypertriglyceridemia  - Has been as high as 6,000 - Now follows with Dr. Mona.  - His testing for [facility] was genetically negative  7. PAD - s/p LLE bypass graft.  - Followed by [facility].  - Chronic lymphedema greatly affecting functional status and QOL - We have tried to arrange lymphedema pumps   8. CKD IIIb - Baseline SCr 1.8 - Follows with Dr. Macel - Labs today.  Follow up in 6-8 weeks with APP.  Mikal Wisman, PA-C  2:50 PM

## 2024-01-23 NOTE — Telephone Encounter (Signed)
 Called to confirm/remind patient of their appointment at the [facility] on 01/24/24.   Appointment:   [] Confirmed  [x] Left mess   [] No answer/No voice mail  [] VM Full/unable to leave message  [] Phone not in service  And to bring in all medications and/or complete list.

## 2024-01-24 ENCOUNTER — Ambulatory Visit (HOSPITAL_COMMUNITY): Payer: Self-pay | Admitting: Cardiology

## 2024-01-24 ENCOUNTER — Ambulatory Visit (HOSPITAL_COMMUNITY)
Admission: RE | Admit: 2024-01-24 | Discharge: 2024-01-24 | Disposition: A | Discharge status: Home / Self Care | Source: Ambulatory Visit | Attending: Cardiology | Admitting: Cardiology

## 2024-01-24 VITALS — BP 130/76 | HR 86 | Wt 253.2 lb

## 2024-01-24 DIAGNOSIS — I340 Nonrheumatic mitral (valve) insufficiency: Secondary | ICD-10-CM | POA: Insufficient documentation

## 2024-01-24 DIAGNOSIS — E8770 Fluid overload, unspecified: Secondary | ICD-10-CM | POA: Insufficient documentation

## 2024-01-24 DIAGNOSIS — Z7985 Long-term (current) use of injectable non-insulin antidiabetic drugs: Secondary | ICD-10-CM | POA: Diagnosis not present

## 2024-01-24 DIAGNOSIS — Z8719 Personal history of other diseases of the digestive system: Secondary | ICD-10-CM | POA: Diagnosis not present

## 2024-01-24 DIAGNOSIS — E1022 Type 1 diabetes mellitus with diabetic chronic kidney disease: Secondary | ICD-10-CM | POA: Insufficient documentation

## 2024-01-24 DIAGNOSIS — E781 Pure hyperglyceridemia: Secondary | ICD-10-CM | POA: Insufficient documentation

## 2024-01-24 DIAGNOSIS — I130 Hypertensive heart and chronic kidney disease with heart failure and stage 1 through stage 4 chronic kidney disease, or unspecified chronic kidney disease: Secondary | ICD-10-CM | POA: Insufficient documentation

## 2024-01-24 DIAGNOSIS — Z713 Dietary counseling and surveillance: Secondary | ICD-10-CM | POA: Diagnosis not present

## 2024-01-24 DIAGNOSIS — Z794 Long term (current) use of insulin: Secondary | ICD-10-CM | POA: Diagnosis not present

## 2024-01-24 DIAGNOSIS — Z951 Presence of aortocoronary bypass graft: Secondary | ICD-10-CM | POA: Diagnosis not present

## 2024-01-24 DIAGNOSIS — Z9641 Presence of insulin pump (external) (internal): Secondary | ICD-10-CM | POA: Insufficient documentation

## 2024-01-24 DIAGNOSIS — Z8679 Personal history of other diseases of the circulatory system: Secondary | ICD-10-CM | POA: Diagnosis not present

## 2024-01-24 DIAGNOSIS — I5020 Unspecified systolic (congestive) heart failure: Secondary | ICD-10-CM

## 2024-01-24 DIAGNOSIS — Z955 Presence of coronary angioplasty implant and graft: Secondary | ICD-10-CM | POA: Diagnosis not present

## 2024-01-24 DIAGNOSIS — I5022 Chronic systolic (congestive) heart failure: Secondary | ICD-10-CM | POA: Diagnosis present

## 2024-01-24 DIAGNOSIS — I5042 Chronic combined systolic (congestive) and diastolic (congestive) heart failure: Secondary | ICD-10-CM | POA: Diagnosis not present

## 2024-01-24 DIAGNOSIS — N1832 Chronic kidney disease, stage 3b: Secondary | ICD-10-CM | POA: Diagnosis not present

## 2024-01-24 DIAGNOSIS — I4510 Unspecified right bundle-branch block: Secondary | ICD-10-CM | POA: Diagnosis not present

## 2024-01-24 DIAGNOSIS — Z79899 Other long term (current) drug therapy: Secondary | ICD-10-CM | POA: Diagnosis not present

## 2024-01-24 DIAGNOSIS — Z7902 Long term (current) use of antithrombotics/antiplatelets: Secondary | ICD-10-CM | POA: Diagnosis not present

## 2024-01-24 DIAGNOSIS — Z7982 Long term (current) use of aspirin: Secondary | ICD-10-CM | POA: Insufficient documentation

## 2024-01-24 DIAGNOSIS — Z7984 Long term (current) use of oral hypoglycemic drugs: Secondary | ICD-10-CM | POA: Diagnosis not present

## 2024-01-24 DIAGNOSIS — I890 Lymphedema, not elsewhere classified: Secondary | ICD-10-CM | POA: Insufficient documentation

## 2024-01-24 DIAGNOSIS — I2510 Atherosclerotic heart disease of native coronary artery without angina pectoris: Secondary | ICD-10-CM | POA: Insufficient documentation

## 2024-01-24 LAB — BASIC METABOLIC PANEL WITH GFR
Anion gap: 8 (ref 5–15)
BUN: 24 mg/dL — ABNORMAL HIGH (ref 6–20)
CO2: 26 mmol/L (ref 22–32)
Calcium: 8.4 mg/dL — ABNORMAL LOW (ref 8.9–10.3)
Chloride: 104 mmol/L (ref 98–111)
Creatinine, Ser: 1.36 mg/dL — ABNORMAL HIGH (ref 0.61–1.24)
GFR, Estimated: >60 mL/min (ref >60)
Glucose, Bld: 165 mg/dL — ABNORMAL HIGH (ref 70–99)
Potassium: 4.3 mmol/L (ref 3.5–5.1)
Sodium: 138 mmol/L (ref 135–145)

## 2024-01-24 LAB — BRAIN NATRIURETIC PEPTIDE: B Natriuretic Peptide: 273.9 pg/mL — ABNORMAL HIGH (ref 0.0–100.0)

## 2024-01-24 MED ORDER — ISOSORBIDE MONONITRATE ER 30 MG PO TB24: 30.0000 mg | ORAL_TABLET | Freq: Every day | ORAL | 2 refills | Status: AC

## 2024-01-24 MED ORDER — TORSEMIDE 20 MG PO TABS
40.0000 mg | ORAL_TABLET | Freq: Every day | ORAL | 3 refills | Status: AC
Start: 2024-01-24 — End: ?

## 2024-01-24 MED ORDER — NITROGLYCERIN 0.4 MG SL SUBL: 0.4000 mg | SUBLINGUAL_TABLET | SUBLINGUAL | 1 refills | Status: AC | PRN

## 2024-01-24 NOTE — Patient Instructions (Addendum)
 Good to see you today!  START Imdur  30 mg daily  INCREASE torsemide  to 40 mg daily  Labs done today, your results will be available in MyChart, we will contact you for abnormal readings.  Repeat lab work in a week  Your physician recommends that you schedule a follow-up appointment as scheduled  If you have any questions or concerns before your next appointment please send us  a message through [place] or call our office at [phone].    TO LEAVE A MESSAGE FOR THE NURSE SELECT OPTION 2, PLEASE LEAVE A MESSAGE INCLUDING: YOUR NAME DATE OF BIRTH CALL BACK NUMBER REASON FOR CALL**this is important as we prioritize the call backs  YOU WILL RECEIVE A CALL BACK THE SAME DAY AS LONG AS YOU CALL BEFORE 4:00 PM At the [facility], you and your health needs are our priority. As part of our continuing mission to provide you with exceptional heart care, we have created designated Provider Care Teams. These Care Teams include your primary Cardiologist (physician) and Advanced Practice Providers (APPs- Physician Assistants and Nurse Practitioners) who all work together to provide you with the care you need, when you need it.   You may see any of the following providers on your designated Care Team at your next follow up: Dr Toribio Fuel Dr Ezra Shuck Dr. Morene Brownie Greig Mosses, NP Caffie Shed, [place] [facility] [place], [place] Beckey Coe, NP Jordan Lee, NP Ellouise Class, NP Tinnie Redman, PharmD Jaun Bash, PharmD   Please be sure to bring in all your medications bottles to every appointment.    Thank you for choosing [facility]

## 2024-01-24 NOTE — Addendum Note (Signed)
 Encounter addended by: Dante Jeannine HERO, CMA on: 01/24/2024 10:53 AM  Actions taken: Order list changed, Diagnosis association updated, Flowsheet accepted, Clinical Note Signed, Charge Capture section accepted

## 2024-01-24 NOTE — Progress Notes (Signed)
 ReDS Vest / Clip - 01/24/24 1000       ReDS Vest / Clip   Station Marker D    Ruler Value 39    ReDS Value Range High volume overload    ReDS Actual Value 45

## 2024-01-31 ENCOUNTER — Ambulatory Visit (HOSPITAL_COMMUNITY)
Admission: RE | Admit: 2024-01-31 | Discharge: 2024-01-31 | Disposition: A | Discharge status: Home / Self Care | Source: Ambulatory Visit | Attending: Internal Medicine | Admitting: Internal Medicine

## 2024-01-31 DIAGNOSIS — I5042 Chronic combined systolic (congestive) and diastolic (congestive) heart failure: Secondary | ICD-10-CM | POA: Diagnosis present

## 2024-01-31 LAB — BASIC METABOLIC PANEL WITH GFR
Anion gap: 11 (ref 5–15)
BUN: 30 mg/dL — ABNORMAL HIGH (ref 6–20)
CO2: 25 mmol/L (ref 22–32)
Calcium: 8.9 mg/dL (ref 8.9–10.3)
Chloride: 104 mmol/L (ref 98–111)
Creatinine, Ser: 1.38 mg/dL — ABNORMAL HIGH (ref 0.61–1.24)
GFR, Estimated: 60 mL/min — ABNORMAL LOW (ref >60)
Glucose, Bld: 146 mg/dL — ABNORMAL HIGH (ref 70–99)
Potassium: 4.1 mmol/L (ref 3.5–5.1)
Sodium: 140 mmol/L (ref 135–145)

## 2024-04-08 ENCOUNTER — Other Ambulatory Visit (HOSPITAL_BASED_OUTPATIENT_CLINIC_OR_DEPARTMENT_OTHER): Payer: Self-pay | Admitting: Internal Medicine

## 2024-04-15 NOTE — Telephone Encounter (Signed)
 Overdue Labs. Pt can get Labs at next appt in March 2026.

## 2024-04-27 ENCOUNTER — Ambulatory Visit

## 2024-04-27 ENCOUNTER — Ambulatory Visit (HOSPITAL_COMMUNITY)

## 2024-04-30 ENCOUNTER — Ambulatory Visit

## 2024-04-30 ENCOUNTER — Other Ambulatory Visit (HOSPITAL_COMMUNITY)

## 2024-04-30 ENCOUNTER — Encounter (HOSPITAL_COMMUNITY)

## 2024-05-20 ENCOUNTER — Encounter (HOSPITAL_BASED_OUTPATIENT_CLINIC_OR_DEPARTMENT_OTHER): Admitting: Nurse Practitioner
